# Patient Record
Sex: Female | Born: 1945
Health system: Southern US, Community
[De-identification: ages and names within clinical notes are randomized; demographics above are authoritative.]

## PROBLEM LIST (undated history)

## (undated) DIAGNOSIS — E039 Hypothyroidism, unspecified: Secondary | ICD-10-CM

## (undated) DIAGNOSIS — F32A Depression, unspecified: Secondary | ICD-10-CM

## (undated) DIAGNOSIS — F419 Anxiety disorder, unspecified: Secondary | ICD-10-CM

## (undated) DIAGNOSIS — F329 Major depressive disorder, single episode, unspecified: Secondary | ICD-10-CM

## (undated) DIAGNOSIS — E119 Type 2 diabetes mellitus without complications: Secondary | ICD-10-CM

## (undated) DIAGNOSIS — M1990 Unspecified osteoarthritis, unspecified site: Secondary | ICD-10-CM

## (undated) DIAGNOSIS — C73 Malignant neoplasm of thyroid gland: Secondary | ICD-10-CM

## (undated) DIAGNOSIS — I2510 Atherosclerotic heart disease of native coronary artery without angina pectoris: Secondary | ICD-10-CM

## (undated) DIAGNOSIS — E7800 Pure hypercholesterolemia, unspecified: Secondary | ICD-10-CM

## (undated) DIAGNOSIS — I10 Essential (primary) hypertension: Secondary | ICD-10-CM

## (undated) DIAGNOSIS — E785 Hyperlipidemia, unspecified: Secondary | ICD-10-CM

## (undated) DIAGNOSIS — M47816 Spondylosis without myelopathy or radiculopathy, lumbar region: Secondary | ICD-10-CM

## (undated) HISTORY — DX: Malignant neoplasm of thyroid gland: C73

## (undated) HISTORY — PX: DOPPLER ECHOCARDIOGRAPHY: SHX263

## (undated) HISTORY — PX: ABDOMINAL HYSTERECTOMY: SHX81

## (undated) HISTORY — DX: Atherosclerotic heart disease of native coronary artery without angina pectoris: I25.10

## (undated) HISTORY — PX: OTHER SURGICAL HISTORY: SHX169

## (undated) HISTORY — PX: KNEE SURGERY: SHX244

## (undated) HISTORY — DX: Hyperlipidemia, unspecified: E78.5

## (undated) HISTORY — DX: Anxiety disorder, unspecified: F41.9

## (undated) HISTORY — PX: TONSILLECTOMY: SUR1361

## (undated) HISTORY — DX: Type 2 diabetes mellitus without complications: E11.9

## (undated) HISTORY — DX: Essential (primary) hypertension: I10

## (undated) HISTORY — PX: CARDIAC CATHETERIZATION: SHX172

## (undated) HISTORY — PX: VESICOVAGINAL FISTULA CLOSURE W/ TAH: SUR271

## (undated) HISTORY — PX: THYROIDECTOMY: SHX17

## (undated) HISTORY — PX: COLONOSCOPY: SHX174

## (undated) HISTORY — DX: Hypothyroidism, unspecified: E03.9

## (undated) HISTORY — DX: Unspecified osteoarthritis, unspecified site: M19.90

## (undated) HISTORY — DX: Spondylosis without myelopathy or radiculopathy, lumbar region: M47.816

---

## 1999-03-23 DIAGNOSIS — C73 Malignant neoplasm of thyroid gland: Secondary | ICD-10-CM | POA: Insufficient documentation

## 1999-03-23 HISTORY — DX: Malignant neoplasm of thyroid gland: C73

## 1999-05-21 ENCOUNTER — Other Ambulatory Visit: Admission: RE | Admit: 1999-05-21 | Discharge: 1999-05-21 | Payer: Self-pay | Admitting: Obstetrics & Gynecology

## 1999-09-24 LAB — HM DIABETES FOOT EXAM

## 2000-12-15 ENCOUNTER — Encounter (HOSPITAL_COMMUNITY): Admission: RE | Admit: 2000-12-15 | Discharge: 2001-01-14 | Payer: Self-pay | Admitting: Rheumatology

## 2001-08-10 ENCOUNTER — Ambulatory Visit (HOSPITAL_COMMUNITY): Admission: RE | Admit: 2001-08-10 | Discharge: 2001-08-10 | Payer: Self-pay | Admitting: Family Medicine

## 2001-08-10 ENCOUNTER — Encounter: Payer: Self-pay | Admitting: Family Medicine

## 2001-12-19 ENCOUNTER — Encounter: Payer: Self-pay | Admitting: Family Medicine

## 2001-12-19 ENCOUNTER — Ambulatory Visit (HOSPITAL_COMMUNITY): Admission: RE | Admit: 2001-12-19 | Discharge: 2001-12-19 | Payer: Self-pay | Admitting: Family Medicine

## 2001-12-25 ENCOUNTER — Ambulatory Visit (HOSPITAL_COMMUNITY): Admission: RE | Admit: 2001-12-25 | Discharge: 2001-12-25 | Payer: Self-pay | Admitting: Family Medicine

## 2001-12-25 ENCOUNTER — Encounter: Payer: Self-pay | Admitting: Family Medicine

## 2002-02-26 ENCOUNTER — Encounter (HOSPITAL_COMMUNITY): Admission: RE | Admit: 2002-02-26 | Discharge: 2002-03-28 | Payer: Self-pay | Admitting: Oncology

## 2002-02-26 ENCOUNTER — Encounter: Admission: RE | Admit: 2002-02-26 | Discharge: 2002-02-26 | Payer: Self-pay | Admitting: Oncology

## 2002-03-29 ENCOUNTER — Ambulatory Visit (HOSPITAL_COMMUNITY): Admission: RE | Admit: 2002-03-29 | Discharge: 2002-03-29 | Payer: Self-pay | Admitting: Family Medicine

## 2002-03-29 ENCOUNTER — Encounter: Payer: Self-pay | Admitting: Family Medicine

## 2002-05-01 ENCOUNTER — Encounter: Admission: RE | Admit: 2002-05-01 | Discharge: 2002-05-01 | Payer: Self-pay | Admitting: Oncology

## 2002-05-02 ENCOUNTER — Encounter (HOSPITAL_COMMUNITY): Admission: RE | Admit: 2002-05-02 | Discharge: 2002-06-01 | Payer: Self-pay | Admitting: Oncology

## 2002-05-02 ENCOUNTER — Encounter (HOSPITAL_COMMUNITY): Payer: Self-pay | Admitting: Oncology

## 2003-04-16 ENCOUNTER — Emergency Department (HOSPITAL_COMMUNITY): Admission: EM | Admit: 2003-04-16 | Discharge: 2003-04-16 | Payer: Self-pay | Admitting: *Deleted

## 2003-05-03 ENCOUNTER — Ambulatory Visit (HOSPITAL_COMMUNITY): Admission: RE | Admit: 2003-05-03 | Discharge: 2003-05-03 | Payer: Self-pay | Admitting: Family Medicine

## 2003-08-08 ENCOUNTER — Ambulatory Visit (HOSPITAL_COMMUNITY): Admission: RE | Admit: 2003-08-08 | Discharge: 2003-08-08 | Payer: Self-pay | Admitting: Family Medicine

## 2003-08-22 ENCOUNTER — Ambulatory Visit (HOSPITAL_COMMUNITY): Admission: RE | Admit: 2003-08-22 | Discharge: 2003-08-22 | Payer: Self-pay | Admitting: Family Medicine

## 2003-10-21 ENCOUNTER — Encounter: Payer: Self-pay | Admitting: Family Medicine

## 2003-10-21 ENCOUNTER — Ambulatory Visit (HOSPITAL_COMMUNITY): Admission: RE | Admit: 2003-10-21 | Discharge: 2003-10-21 | Payer: Self-pay | Admitting: Internal Medicine

## 2003-10-21 LAB — HM COLONOSCOPY: HM Colonoscopy: ABNORMAL

## 2004-02-14 ENCOUNTER — Ambulatory Visit: Payer: Self-pay | Admitting: Family Medicine

## 2004-02-17 ENCOUNTER — Ambulatory Visit: Payer: Self-pay | Admitting: Family Medicine

## 2004-02-19 ENCOUNTER — Ambulatory Visit (HOSPITAL_COMMUNITY): Admission: RE | Admit: 2004-02-19 | Discharge: 2004-02-19 | Payer: Self-pay | Admitting: Family Medicine

## 2004-03-11 ENCOUNTER — Ambulatory Visit: Payer: Self-pay | Admitting: Family Medicine

## 2004-03-12 ENCOUNTER — Ambulatory Visit (HOSPITAL_COMMUNITY): Admission: RE | Admit: 2004-03-12 | Discharge: 2004-03-12 | Payer: Self-pay | Admitting: Family Medicine

## 2004-03-22 HISTORY — PX: SPINE SURGERY: SHX786

## 2004-05-25 ENCOUNTER — Ambulatory Visit: Payer: Self-pay | Admitting: Family Medicine

## 2004-06-01 ENCOUNTER — Ambulatory Visit: Payer: Self-pay | Admitting: Family Medicine

## 2004-06-07 ENCOUNTER — Ambulatory Visit (HOSPITAL_COMMUNITY): Admission: RE | Admit: 2004-06-07 | Discharge: 2004-06-07 | Payer: Self-pay | Admitting: Family Medicine

## 2004-06-11 ENCOUNTER — Inpatient Hospital Stay (HOSPITAL_COMMUNITY): Admission: RE | Admit: 2004-06-11 | Discharge: 2004-06-12 | Payer: Self-pay | Admitting: Neurosurgery

## 2004-07-14 ENCOUNTER — Ambulatory Visit (HOSPITAL_COMMUNITY): Admission: RE | Admit: 2004-07-14 | Discharge: 2004-07-14 | Payer: Self-pay | Admitting: Family Medicine

## 2004-07-14 ENCOUNTER — Ambulatory Visit: Payer: Self-pay | Admitting: Family Medicine

## 2004-10-08 ENCOUNTER — Other Ambulatory Visit: Admission: RE | Admit: 2004-10-08 | Discharge: 2004-10-08 | Payer: Self-pay | Admitting: Obstetrics & Gynecology

## 2004-12-07 ENCOUNTER — Ambulatory Visit: Payer: Self-pay | Admitting: Family Medicine

## 2005-01-18 ENCOUNTER — Ambulatory Visit: Payer: Self-pay | Admitting: Family Medicine

## 2005-01-19 ENCOUNTER — Ambulatory Visit (HOSPITAL_COMMUNITY): Admission: RE | Admit: 2005-01-19 | Discharge: 2005-01-19 | Payer: Self-pay | Admitting: Family Medicine

## 2005-01-25 ENCOUNTER — Ambulatory Visit (HOSPITAL_COMMUNITY): Admission: RE | Admit: 2005-01-25 | Discharge: 2005-01-25 | Payer: Self-pay | Admitting: Family Medicine

## 2005-02-25 ENCOUNTER — Ambulatory Visit: Payer: Self-pay | Admitting: Family Medicine

## 2005-03-04 ENCOUNTER — Ambulatory Visit: Payer: Self-pay | Admitting: Family Medicine

## 2005-03-19 ENCOUNTER — Ambulatory Visit: Payer: Self-pay | Admitting: Family Medicine

## 2005-04-30 ENCOUNTER — Ambulatory Visit: Payer: Self-pay | Admitting: Family Medicine

## 2005-05-04 ENCOUNTER — Ambulatory Visit: Payer: Self-pay | Admitting: Family Medicine

## 2005-07-07 ENCOUNTER — Ambulatory Visit: Payer: Self-pay | Admitting: Family Medicine

## 2005-07-22 ENCOUNTER — Ambulatory Visit: Payer: Self-pay | Admitting: Family Medicine

## 2005-07-26 ENCOUNTER — Ambulatory Visit (HOSPITAL_COMMUNITY): Admission: RE | Admit: 2005-07-26 | Discharge: 2005-07-26 | Payer: Self-pay | Admitting: Family Medicine

## 2005-10-05 ENCOUNTER — Ambulatory Visit: Payer: Self-pay | Admitting: Family Medicine

## 2005-12-22 ENCOUNTER — Ambulatory Visit: Payer: Self-pay | Admitting: Family Medicine

## 2005-12-24 ENCOUNTER — Ambulatory Visit (HOSPITAL_COMMUNITY): Admission: RE | Admit: 2005-12-24 | Discharge: 2005-12-24 | Payer: Self-pay | Admitting: Family Medicine

## 2006-06-15 ENCOUNTER — Ambulatory Visit: Payer: Self-pay | Admitting: Family Medicine

## 2006-06-17 ENCOUNTER — Encounter: Payer: Self-pay | Admitting: Family Medicine

## 2006-06-17 LAB — CONVERTED CEMR LAB
ALT: 25 units/L (ref 0–35)
AST: 16 units/L (ref 0–37)
Albumin: 4.6 g/dL (ref 3.5–5.2)
Alkaline Phosphatase: 64 units/L (ref 39–117)
BUN: 25 mg/dL — ABNORMAL HIGH (ref 6–23)
Basophils Absolute: 0 10*3/uL (ref 0.0–0.1)
Basophils Relative: 0 % (ref 0–1)
Bilirubin, Direct: 0.1 mg/dL (ref 0.0–0.3)
CO2: 25 meq/L (ref 19–32)
Calcium: 9.7 mg/dL (ref 8.4–10.5)
Chloride: 95 meq/L — ABNORMAL LOW (ref 96–112)
Cholesterol: 189 mg/dL (ref 0–200)
Creatinine, Ser: 1.01 mg/dL (ref 0.40–1.20)
Eosinophils Absolute: 0.5 10*3/uL (ref 0.0–0.7)
Eosinophils Relative: 6 % — ABNORMAL HIGH (ref 0–5)
Glucose, Bld: 164 mg/dL — ABNORMAL HIGH (ref 70–99)
HCT: 41.2 % (ref 36.0–46.0)
HDL: 40 mg/dL (ref >39)
Hemoglobin: 12.7 g/dL (ref 12.0–15.0)
Indirect Bilirubin: 0.3 mg/dL (ref 0.0–0.9)
LDL Cholesterol: 117 mg/dL — ABNORMAL HIGH (ref 0–99)
Lymphocytes Relative: 35 % (ref 12–46)
Lymphs Abs: 2.9 10*3/uL (ref 0.7–3.3)
MCHC: 30.8 g/dL (ref 30.0–36.0)
MCV: 84.1 fL (ref 78.0–100.0)
Microalb, Ur: 2.58 mg/dL — ABNORMAL HIGH (ref 0.00–1.89)
Monocytes Absolute: 0.4 10*3/uL (ref 0.2–0.7)
Monocytes Relative: 5 % (ref 3–11)
Neutro Abs: 4.4 10*3/uL (ref 1.7–7.7)
Neutrophils Relative %: 54 % (ref 43–77)
Platelets: 341 10*3/uL (ref 150–400)
Potassium: 5 meq/L (ref 3.5–5.3)
RBC: 4.9 M/uL (ref 3.87–5.11)
RDW: 13.4 % (ref 11.5–14.0)
Sodium: 135 meq/L (ref 135–145)
TSH: 0.015 microintl units/mL — ABNORMAL LOW (ref 0.350–5.50)
Total Bilirubin: 0.4 mg/dL (ref 0.3–1.2)
Total CHOL/HDL Ratio: 4.7
Total Protein: 8.5 g/dL — ABNORMAL HIGH (ref 6.0–8.3)
Triglycerides: 160 mg/dL — ABNORMAL HIGH (ref <150)
VLDL: 32 mg/dL (ref 0–40)
WBC: 8.2 10*3/uL (ref 4.0–10.5)

## 2006-06-20 ENCOUNTER — Encounter: Payer: Self-pay | Admitting: Family Medicine

## 2006-06-20 LAB — CONVERTED CEMR LAB: Hgb A1c MFr Bld: 8.3 % — ABNORMAL HIGH (ref 4.6–6.1)

## 2006-08-23 ENCOUNTER — Ambulatory Visit: Payer: Self-pay | Admitting: Family Medicine

## 2006-09-21 ENCOUNTER — Encounter: Payer: Self-pay | Admitting: Family Medicine

## 2006-09-21 LAB — CONVERTED CEMR LAB
ALT: 23 units/L (ref 0–35)
AST: 16 units/L (ref 0–37)
Albumin: 4.4 g/dL (ref 3.5–5.2)
Alkaline Phosphatase: 58 units/L (ref 39–117)
BUN: 30 mg/dL — ABNORMAL HIGH (ref 6–23)
Bilirubin, Direct: 0.1 mg/dL (ref 0.0–0.3)
CO2: 24 meq/L (ref 19–32)
Calcium: 9.5 mg/dL (ref 8.4–10.5)
Chloride: 98 meq/L (ref 96–112)
Cholesterol: 97 mg/dL (ref 0–200)
Creatinine, Ser: 1.14 mg/dL (ref 0.40–1.20)
Glucose, Bld: 152 mg/dL — ABNORMAL HIGH (ref 70–99)
HDL: 36 mg/dL — ABNORMAL LOW (ref >39)
Hgb A1c MFr Bld: 7.9 % — ABNORMAL HIGH (ref 4.6–6.1)
Indirect Bilirubin: 0.3 mg/dL (ref 0.0–0.9)
LDL Cholesterol: 40 mg/dL (ref 0–99)
Potassium: 6 meq/L — ABNORMAL HIGH (ref 3.5–5.3)
Sodium: 134 meq/L — ABNORMAL LOW (ref 135–145)
Total Bilirubin: 0.4 mg/dL (ref 0.3–1.2)
Total CHOL/HDL Ratio: 2.7
Total Protein: 8.2 g/dL (ref 6.0–8.3)
Triglycerides: 104 mg/dL (ref <150)
VLDL: 21 mg/dL (ref 0–40)

## 2006-09-22 ENCOUNTER — Ambulatory Visit: Payer: Self-pay | Admitting: Family Medicine

## 2006-09-22 LAB — CONVERTED CEMR LAB: TSH: 0.035 microintl units/mL — ABNORMAL LOW (ref 0.350–5.50)

## 2006-09-27 ENCOUNTER — Encounter: Payer: Self-pay | Admitting: Family Medicine

## 2006-09-27 LAB — CONVERTED CEMR LAB
BUN: 21 mg/dL (ref 6–23)
CO2: 28 meq/L (ref 19–32)
Calcium: 8.9 mg/dL (ref 8.4–10.5)
Chloride: 98 meq/L (ref 96–112)
Creatinine, Ser: 0.88 mg/dL (ref 0.40–1.20)
Glucose, Bld: 206 mg/dL — ABNORMAL HIGH (ref 70–99)
Potassium: 4.4 meq/L (ref 3.5–5.3)
Sodium: 132 meq/L — ABNORMAL LOW (ref 135–145)

## 2006-10-19 ENCOUNTER — Ambulatory Visit: Payer: Self-pay | Admitting: Family Medicine

## 2006-10-24 ENCOUNTER — Ambulatory Visit (HOSPITAL_COMMUNITY): Admission: RE | Admit: 2006-10-24 | Discharge: 2006-10-24 | Payer: Self-pay | Admitting: Family Medicine

## 2006-11-01 ENCOUNTER — Encounter (HOSPITAL_COMMUNITY): Admission: RE | Admit: 2006-11-01 | Discharge: 2006-12-01 | Payer: Self-pay | Admitting: Family Medicine

## 2006-11-11 ENCOUNTER — Ambulatory Visit: Payer: Self-pay | Admitting: Family Medicine

## 2006-11-11 LAB — CONVERTED CEMR LAB: Potassium: 4.4 meq/L (ref 3.5–5.3)

## 2006-11-24 ENCOUNTER — Ambulatory Visit: Payer: Self-pay | Admitting: Family Medicine

## 2006-11-25 ENCOUNTER — Encounter: Payer: Self-pay | Admitting: Family Medicine

## 2006-12-02 ENCOUNTER — Encounter (HOSPITAL_COMMUNITY): Admission: RE | Admit: 2006-12-02 | Discharge: 2006-12-20 | Payer: Self-pay | Admitting: Family Medicine

## 2006-12-15 ENCOUNTER — Ambulatory Visit: Payer: Self-pay | Admitting: Family Medicine

## 2006-12-15 LAB — CONVERTED CEMR LAB
ALT: 29 units/L (ref 0–35)
AST: 22 units/L (ref 0–37)
Albumin: 4.4 g/dL (ref 3.5–5.2)
Alkaline Phosphatase: 59 units/L (ref 39–117)
BUN: 19 mg/dL (ref 6–23)
Basophils Absolute: 0 10*3/uL (ref 0.0–0.1)
Basophils Relative: 1 % (ref 0–1)
Bilirubin, Direct: <0.1 mg/dL (ref 0.0–0.3)
CO2: 25 meq/L (ref 19–32)
Calcium: 9.5 mg/dL (ref 8.4–10.5)
Chloride: 100 meq/L (ref 96–112)
Creatinine, Ser: 0.8 mg/dL (ref 0.40–1.20)
Eosinophils Absolute: 0.1 10*3/uL (ref 0.0–0.7)
Eosinophils Relative: 1 % (ref 0–5)
Glucose, Bld: 93 mg/dL (ref 70–99)
HCT: 37.3 % (ref 36.0–46.0)
Hemoglobin: 11.7 g/dL — ABNORMAL LOW (ref 12.0–15.0)
Lymphocytes Relative: 27 % (ref 12–46)
Lymphs Abs: 1.3 10*3/uL (ref 0.7–3.3)
MCHC: 31.4 g/dL (ref 30.0–36.0)
MCV: 82.7 fL (ref 78.0–100.0)
Monocytes Absolute: 0.5 10*3/uL (ref 0.2–0.7)
Monocytes Relative: 11 % (ref 3–11)
Neutro Abs: 2.9 10*3/uL (ref 1.7–7.7)
Neutrophils Relative %: 61 % (ref 43–77)
Platelets: 337 10*3/uL (ref 150–400)
Potassium: 4 meq/L (ref 3.5–5.3)
RBC: 4.51 M/uL (ref 3.87–5.11)
RDW: 13 % (ref 11.5–14.0)
Sodium: 137 meq/L (ref 135–145)
Total Bilirubin: 0.3 mg/dL (ref 0.3–1.2)
Total CK: 64 units/L (ref 7–177)
Total Protein: 8.5 g/dL — ABNORMAL HIGH (ref 6.0–8.3)
WBC: 4.7 10*3/uL (ref 4.0–10.5)

## 2006-12-21 ENCOUNTER — Ambulatory Visit (HOSPITAL_COMMUNITY): Admission: RE | Admit: 2006-12-21 | Discharge: 2006-12-21 | Payer: Self-pay | Admitting: Family Medicine

## 2006-12-27 ENCOUNTER — Ambulatory Visit (HOSPITAL_COMMUNITY): Admission: RE | Admit: 2006-12-27 | Discharge: 2006-12-27 | Payer: Self-pay | Admitting: Family Medicine

## 2006-12-27 ENCOUNTER — Ambulatory Visit: Payer: Self-pay | Admitting: Family Medicine

## 2007-01-04 ENCOUNTER — Ambulatory Visit: Payer: Self-pay | Admitting: Family Medicine

## 2007-01-05 ENCOUNTER — Ambulatory Visit (HOSPITAL_COMMUNITY): Admission: RE | Admit: 2007-01-05 | Discharge: 2007-01-05 | Payer: Self-pay | Admitting: Family Medicine

## 2007-02-01 ENCOUNTER — Ambulatory Visit: Payer: Self-pay | Admitting: Family Medicine

## 2007-02-02 ENCOUNTER — Encounter: Payer: Self-pay | Admitting: Family Medicine

## 2007-03-20 ENCOUNTER — Ambulatory Visit: Payer: Self-pay | Admitting: Family Medicine

## 2007-04-14 ENCOUNTER — Encounter: Payer: Self-pay | Admitting: Family Medicine

## 2007-04-14 DIAGNOSIS — I10 Essential (primary) hypertension: Secondary | ICD-10-CM | POA: Insufficient documentation

## 2007-04-14 DIAGNOSIS — G5600 Carpal tunnel syndrome, unspecified upper limb: Secondary | ICD-10-CM | POA: Insufficient documentation

## 2007-04-14 DIAGNOSIS — R519 Headache, unspecified: Secondary | ICD-10-CM | POA: Insufficient documentation

## 2007-04-14 DIAGNOSIS — R51 Headache: Secondary | ICD-10-CM

## 2007-04-14 DIAGNOSIS — M479 Spondylosis, unspecified: Secondary | ICD-10-CM | POA: Insufficient documentation

## 2007-04-14 DIAGNOSIS — I2510 Atherosclerotic heart disease of native coronary artery without angina pectoris: Secondary | ICD-10-CM | POA: Insufficient documentation

## 2007-04-14 DIAGNOSIS — E785 Hyperlipidemia, unspecified: Secondary | ICD-10-CM | POA: Insufficient documentation

## 2007-05-11 ENCOUNTER — Ambulatory Visit: Payer: Self-pay | Admitting: Family Medicine

## 2007-05-24 ENCOUNTER — Ambulatory Visit: Payer: Self-pay | Admitting: Family Medicine

## 2007-09-11 ENCOUNTER — Ambulatory Visit: Payer: Self-pay | Admitting: Family Medicine

## 2007-09-11 LAB — CONVERTED CEMR LAB: Microalb, Ur: 13.6 mg/dL — ABNORMAL HIGH (ref 0.00–1.89)

## 2007-09-12 ENCOUNTER — Encounter: Payer: Self-pay | Admitting: Family Medicine

## 2007-09-18 ENCOUNTER — Ambulatory Visit (HOSPITAL_COMMUNITY): Admission: RE | Admit: 2007-09-18 | Discharge: 2007-09-18 | Payer: Self-pay | Admitting: Family Medicine

## 2007-09-18 ENCOUNTER — Ambulatory Visit: Payer: Self-pay | Admitting: Family Medicine

## 2007-09-19 ENCOUNTER — Ambulatory Visit (HOSPITAL_COMMUNITY): Admission: RE | Admit: 2007-09-19 | Discharge: 2007-09-19 | Payer: Self-pay | Admitting: Family Medicine

## 2007-10-05 ENCOUNTER — Ambulatory Visit (HOSPITAL_COMMUNITY): Admission: RE | Admit: 2007-10-05 | Discharge: 2007-10-05 | Payer: Self-pay | Admitting: Family Medicine

## 2007-10-24 ENCOUNTER — Ambulatory Visit: Payer: Self-pay | Admitting: Otolaryngology

## 2007-10-31 ENCOUNTER — Encounter: Payer: Self-pay | Admitting: Family Medicine

## 2007-11-14 ENCOUNTER — Encounter: Payer: Self-pay | Admitting: Family Medicine

## 2007-11-21 ENCOUNTER — Encounter: Payer: Self-pay | Admitting: Family Medicine

## 2007-11-23 ENCOUNTER — Ambulatory Visit: Payer: Self-pay | Admitting: Family Medicine

## 2007-11-23 DIAGNOSIS — J309 Allergic rhinitis, unspecified: Secondary | ICD-10-CM | POA: Insufficient documentation

## 2007-11-23 LAB — CONVERTED CEMR LAB: Glucose, Bld: 80 mg/dL

## 2007-12-06 ENCOUNTER — Ambulatory Visit: Payer: Self-pay | Admitting: Family Medicine

## 2007-12-14 ENCOUNTER — Encounter: Payer: Self-pay | Admitting: Family Medicine

## 2008-03-26 ENCOUNTER — Encounter: Payer: Self-pay | Admitting: Family Medicine

## 2008-03-29 ENCOUNTER — Ambulatory Visit: Payer: Self-pay | Admitting: Family Medicine

## 2008-03-29 LAB — CONVERTED CEMR LAB
Glucose, Bld: 139 mg/dL
Hgb A1c MFr Bld: 7.8 %

## 2008-03-30 DIAGNOSIS — E669 Obesity, unspecified: Secondary | ICD-10-CM | POA: Insufficient documentation

## 2008-04-11 ENCOUNTER — Ambulatory Visit: Payer: Self-pay | Admitting: Family Medicine

## 2008-04-11 DIAGNOSIS — H6500 Acute serous otitis media, unspecified ear: Secondary | ICD-10-CM | POA: Insufficient documentation

## 2008-04-11 LAB — CONVERTED CEMR LAB
BUN: 16 mg/dL (ref 6–23)
Basophils Absolute: 0.1 10*3/uL (ref 0.0–0.1)
Basophils Relative: 1 % (ref 0–1)
CO2: 30 meq/L (ref 19–32)
Calcium: 8.9 mg/dL (ref 8.4–10.5)
Chloride: 100 meq/L (ref 96–112)
Creatinine, Ser: 0.67 mg/dL (ref 0.40–1.20)
Eosinophils Absolute: 0.3 10*3/uL (ref 0.0–0.7)
Eosinophils Relative: 5 % (ref 0–5)
Glucose, Bld: 138 mg/dL — ABNORMAL HIGH (ref 70–99)
HCT: 36.1 % (ref 36.0–46.0)
Hemoglobin: 11.7 g/dL — ABNORMAL LOW (ref 12.0–15.0)
Lymphocytes Relative: 35 % (ref 12–46)
Lymphs Abs: 2.3 10*3/uL (ref 0.7–4.0)
MCHC: 32.3 g/dL (ref 30.0–36.0)
MCV: 82.9 fL (ref 78.0–100.0)
Monocytes Absolute: 0.5 10*3/uL (ref 0.1–1.0)
Monocytes Relative: 7 % (ref 3–12)
Neutro Abs: 3.4 10*3/uL (ref 1.7–7.7)
Neutrophils Relative %: 52 % (ref 43–77)
Platelets: 283 10*3/uL (ref 150–400)
Potassium: 4.1 meq/L (ref 3.5–5.3)
RBC: 4.35 M/uL (ref 3.87–5.11)
RDW: 13.8 % (ref 11.5–15.5)
Sodium: 136 meq/L (ref 135–145)
WBC: 6.6 10*3/uL (ref 4.0–10.5)

## 2008-05-22 ENCOUNTER — Encounter: Payer: Self-pay | Admitting: Family Medicine

## 2008-06-04 ENCOUNTER — Encounter: Payer: Self-pay | Admitting: Family Medicine

## 2008-07-17 ENCOUNTER — Ambulatory Visit: Payer: Self-pay | Admitting: Family Medicine

## 2008-07-17 LAB — CONVERTED CEMR LAB
Glucose, Bld: 148 mg/dL
Hgb A1c MFr Bld: 7.7 %

## 2008-08-05 ENCOUNTER — Ambulatory Visit: Payer: Self-pay | Admitting: Family Medicine

## 2008-08-05 LAB — CONVERTED CEMR LAB: Glucose, Bld: 195 mg/dL

## 2008-08-06 ENCOUNTER — Encounter: Payer: Self-pay | Admitting: Family Medicine

## 2008-08-13 ENCOUNTER — Ambulatory Visit (HOSPITAL_COMMUNITY): Admission: RE | Admit: 2008-08-13 | Discharge: 2008-08-13 | Payer: Self-pay | Admitting: Family Medicine

## 2008-10-07 ENCOUNTER — Encounter: Payer: Self-pay | Admitting: Family Medicine

## 2008-10-16 ENCOUNTER — Ambulatory Visit: Payer: Self-pay | Admitting: Family Medicine

## 2008-10-16 LAB — CONVERTED CEMR LAB
Glucose, Bld: 167 mg/dL
Hgb A1c MFr Bld: 7.7 %

## 2008-10-17 ENCOUNTER — Encounter: Payer: Self-pay | Admitting: Family Medicine

## 2008-10-17 LAB — CONVERTED CEMR LAB
Creatinine, Urine: 130.7 mg/dL
Microalb Creat Ratio: 7.5 mg/g (ref 0.0–30.0)
Microalb, Ur: 0.98 mg/dL (ref 0.00–1.89)

## 2008-10-25 ENCOUNTER — Telehealth: Payer: Self-pay | Admitting: Family Medicine

## 2008-11-29 ENCOUNTER — Encounter: Payer: Self-pay | Admitting: Family Medicine

## 2008-12-09 ENCOUNTER — Telehealth: Payer: Self-pay | Admitting: Family Medicine

## 2008-12-19 ENCOUNTER — Encounter: Payer: Self-pay | Admitting: Family Medicine

## 2008-12-19 LAB — CONVERTED CEMR LAB
ALT: 24 units/L (ref 0–35)
AST: 21 units/L (ref 0–37)
Albumin: 4.3 g/dL (ref 3.5–5.2)
Alkaline Phosphatase: 43 units/L (ref 39–117)
BUN: 14 mg/dL (ref 6–23)
Bilirubin, Direct: 0.1 mg/dL (ref 0.0–0.3)
CO2: 26 meq/L (ref 19–32)
Calcium: 9.2 mg/dL (ref 8.4–10.5)
Chloride: 100 meq/L (ref 96–112)
Cholesterol: 148 mg/dL (ref 0–200)
Creatinine, Ser: 0.84 mg/dL (ref 0.40–1.20)
Glucose, Bld: 138 mg/dL — ABNORMAL HIGH (ref 70–99)
HDL: 41 mg/dL (ref >39)
Indirect Bilirubin: 0.2 mg/dL (ref 0.0–0.9)
LDL Cholesterol: 83 mg/dL (ref 0–99)
Potassium: 4.3 meq/L (ref 3.5–5.3)
Sodium: 140 meq/L (ref 135–145)
Total Bilirubin: 0.3 mg/dL (ref 0.3–1.2)
Total CHOL/HDL Ratio: 3.6
Total Protein: 7.8 g/dL (ref 6.0–8.3)
Triglycerides: 120 mg/dL (ref <150)
VLDL: 24 mg/dL (ref 0–40)

## 2009-01-09 ENCOUNTER — Ambulatory Visit: Payer: Self-pay | Admitting: Family Medicine

## 2009-01-09 DIAGNOSIS — F329 Major depressive disorder, single episode, unspecified: Secondary | ICD-10-CM | POA: Insufficient documentation

## 2009-01-09 LAB — CONVERTED CEMR LAB
Glucose, Bld: 146 mg/dL
Hgb A1c MFr Bld: 7.5 %

## 2009-01-30 ENCOUNTER — Ambulatory Visit (HOSPITAL_COMMUNITY): Payer: Self-pay | Admitting: Psychiatry

## 2009-02-06 ENCOUNTER — Ambulatory Visit: Payer: Self-pay | Admitting: Family Medicine

## 2009-02-06 LAB — CONVERTED CEMR LAB: Glucose, Bld: 134 mg/dL

## 2009-02-10 ENCOUNTER — Ambulatory Visit (HOSPITAL_COMMUNITY): Payer: Self-pay | Admitting: Psychiatry

## 2009-02-26 ENCOUNTER — Ambulatory Visit (HOSPITAL_COMMUNITY): Payer: Self-pay | Admitting: Psychiatry

## 2009-02-26 ENCOUNTER — Telehealth: Payer: Self-pay | Admitting: Family Medicine

## 2009-02-28 ENCOUNTER — Telehealth: Payer: Self-pay | Admitting: Family Medicine

## 2009-03-12 ENCOUNTER — Encounter: Payer: Self-pay | Admitting: Family Medicine

## 2009-03-12 ENCOUNTER — Ambulatory Visit (HOSPITAL_COMMUNITY): Payer: Self-pay | Admitting: Psychiatry

## 2009-03-26 ENCOUNTER — Ambulatory Visit (HOSPITAL_COMMUNITY): Payer: Self-pay | Admitting: Psychiatry

## 2009-04-02 ENCOUNTER — Encounter: Payer: Self-pay | Admitting: Family Medicine

## 2009-04-04 ENCOUNTER — Encounter: Admission: RE | Admit: 2009-04-04 | Discharge: 2009-04-04 | Payer: Self-pay | Admitting: Neurosurgery

## 2009-04-17 ENCOUNTER — Ambulatory Visit: Payer: Self-pay | Admitting: Family Medicine

## 2009-04-17 DIAGNOSIS — R5381 Other malaise: Secondary | ICD-10-CM | POA: Insufficient documentation

## 2009-04-17 DIAGNOSIS — R5383 Other fatigue: Secondary | ICD-10-CM | POA: Insufficient documentation

## 2009-04-17 LAB — CONVERTED CEMR LAB
Glucose, Bld: 187 mg/dL
Hgb A1c MFr Bld: 8.7 %

## 2009-04-27 DIAGNOSIS — J019 Acute sinusitis, unspecified: Secondary | ICD-10-CM | POA: Insufficient documentation

## 2009-04-29 ENCOUNTER — Encounter: Payer: Self-pay | Admitting: Family Medicine

## 2009-05-07 ENCOUNTER — Telehealth: Payer: Self-pay | Admitting: Family Medicine

## 2009-05-08 ENCOUNTER — Ambulatory Visit (HOSPITAL_COMMUNITY): Payer: Self-pay | Admitting: Psychiatry

## 2009-05-09 ENCOUNTER — Encounter: Payer: Self-pay | Admitting: Family Medicine

## 2009-05-12 ENCOUNTER — Encounter: Payer: Self-pay | Admitting: Family Medicine

## 2009-05-30 ENCOUNTER — Ambulatory Visit (HOSPITAL_COMMUNITY): Payer: Self-pay | Admitting: Psychiatry

## 2009-06-17 ENCOUNTER — Encounter: Payer: Self-pay | Admitting: Family Medicine

## 2009-06-19 ENCOUNTER — Ambulatory Visit: Payer: Self-pay | Admitting: Family Medicine

## 2009-06-19 LAB — CONVERTED CEMR LAB: Blood Glucose, Fasting: 155 mg/dL

## 2009-07-18 ENCOUNTER — Ambulatory Visit: Payer: Self-pay | Admitting: Family Medicine

## 2009-07-18 DIAGNOSIS — R079 Chest pain, unspecified: Secondary | ICD-10-CM | POA: Insufficient documentation

## 2009-07-23 ENCOUNTER — Encounter: Payer: Self-pay | Admitting: Family Medicine

## 2009-07-31 ENCOUNTER — Ambulatory Visit (HOSPITAL_COMMUNITY): Admission: RE | Admit: 2009-07-31 | Discharge: 2009-07-31 | Payer: Self-pay | Admitting: Neurosurgery

## 2009-08-05 ENCOUNTER — Encounter: Payer: Self-pay | Admitting: Family Medicine

## 2009-08-08 ENCOUNTER — Encounter: Payer: Self-pay | Admitting: Family Medicine

## 2009-09-01 ENCOUNTER — Encounter: Payer: Self-pay | Admitting: Family Medicine

## 2009-09-23 ENCOUNTER — Ambulatory Visit: Payer: Self-pay | Admitting: Family Medicine

## 2009-09-24 LAB — CONVERTED CEMR LAB
BUN: 13 mg/dL (ref 6–23)
Basophils Absolute: 0 10*3/uL (ref 0.0–0.1)
Basophils Relative: 0 % (ref 0–1)
CO2: 25 meq/L (ref 19–32)
Calcium: 9.1 mg/dL (ref 8.4–10.5)
Chloride: 99 meq/L (ref 96–112)
Creatinine, Ser: 0.68 mg/dL (ref 0.40–1.20)
Eosinophils Absolute: 0.3 10*3/uL (ref 0.0–0.7)
Eosinophils Relative: 3 % (ref 0–5)
Glucose, Bld: 221 mg/dL — ABNORMAL HIGH (ref 70–99)
HCT: 36.5 % (ref 36.0–46.0)
Hemoglobin: 11.8 g/dL — ABNORMAL LOW (ref 12.0–15.0)
Hgb A1c MFr Bld: 8.5 % — ABNORMAL HIGH (ref <5.7)
Lymphocytes Relative: 32 % (ref 12–46)
Lymphs Abs: 2.3 10*3/uL (ref 0.7–4.0)
MCHC: 32.3 g/dL (ref 30.0–36.0)
MCV: 83.9 fL (ref 78.0–100.0)
Monocytes Absolute: 0.4 10*3/uL (ref 0.1–1.0)
Monocytes Relative: 5 % (ref 3–12)
Neutro Abs: 4.4 10*3/uL (ref 1.7–7.7)
Neutrophils Relative %: 60 % (ref 43–77)
Platelets: 279 10*3/uL (ref 150–400)
Potassium: 4 meq/L (ref 3.5–5.3)
RBC: 4.35 M/uL (ref 3.87–5.11)
RDW: 14.1 % (ref 11.5–15.5)
Sodium: 139 meq/L (ref 135–145)
WBC: 7.3 10*3/uL (ref 4.0–10.5)

## 2009-11-20 ENCOUNTER — Encounter: Payer: Self-pay | Admitting: Family Medicine

## 2009-12-12 ENCOUNTER — Ambulatory Visit: Payer: Self-pay | Admitting: Family Medicine

## 2009-12-13 ENCOUNTER — Encounter: Payer: Self-pay | Admitting: Family Medicine

## 2009-12-13 LAB — CONVERTED CEMR LAB
Creatinine, Urine: 169 mg/dL
Microalb Creat Ratio: 29.8 mg/g (ref 0.0–30.0)
Microalb, Ur: 5.04 mg/dL — ABNORMAL HIGH (ref 0.00–1.89)

## 2009-12-17 ENCOUNTER — Telehealth: Payer: Self-pay | Admitting: Physician Assistant

## 2010-01-19 ENCOUNTER — Telehealth: Payer: Self-pay | Admitting: Family Medicine

## 2010-01-20 ENCOUNTER — Ambulatory Visit: Payer: Self-pay | Admitting: Family Medicine

## 2010-01-20 DIAGNOSIS — R1319 Other dysphagia: Secondary | ICD-10-CM | POA: Insufficient documentation

## 2010-01-20 DIAGNOSIS — K3189 Other diseases of stomach and duodenum: Secondary | ICD-10-CM | POA: Insufficient documentation

## 2010-01-20 DIAGNOSIS — R1013 Epigastric pain: Secondary | ICD-10-CM

## 2010-01-23 ENCOUNTER — Ambulatory Visit: Payer: Self-pay | Admitting: Internal Medicine

## 2010-01-23 DIAGNOSIS — K219 Gastro-esophageal reflux disease without esophagitis: Secondary | ICD-10-CM | POA: Insufficient documentation

## 2010-01-27 ENCOUNTER — Encounter: Payer: Self-pay | Admitting: Internal Medicine

## 2010-01-27 ENCOUNTER — Telehealth: Payer: Self-pay | Admitting: Family Medicine

## 2010-01-28 ENCOUNTER — Telehealth (INDEPENDENT_AMBULATORY_CARE_PROVIDER_SITE_OTHER): Payer: Self-pay | Admitting: *Deleted

## 2010-01-28 ENCOUNTER — Ambulatory Visit (HOSPITAL_COMMUNITY): Admission: RE | Admit: 2010-01-28 | Discharge: 2010-01-28 | Payer: Self-pay | Admitting: Family Medicine

## 2010-01-28 LAB — CONVERTED CEMR LAB
BUN: 15 mg/dL (ref 6–23)
CO2: 29 meq/L (ref 19–32)
Calcium: 9.3 mg/dL (ref 8.4–10.5)
Chloride: 97 meq/L (ref 96–112)
Creatinine, Ser: 0.81 mg/dL (ref 0.40–1.20)
Glucose, Bld: 117 mg/dL — ABNORMAL HIGH (ref 70–99)
Helicobacter Pylori Antibody-IgG: <0.4
Hgb A1c MFr Bld: 8.4 % — ABNORMAL HIGH (ref <5.7)
Potassium: 3.9 meq/L (ref 3.5–5.3)
Sodium: 137 meq/L (ref 135–145)

## 2010-02-04 ENCOUNTER — Encounter (HOSPITAL_COMMUNITY)
Admission: RE | Admit: 2010-02-04 | Discharge: 2010-03-06 | Payer: Self-pay | Source: Home / Self Care | Attending: Family Medicine | Admitting: Family Medicine

## 2010-04-07 ENCOUNTER — Encounter: Payer: Self-pay | Admitting: Family Medicine

## 2010-04-08 ENCOUNTER — Ambulatory Visit
Admission: RE | Admit: 2010-04-08 | Discharge: 2010-04-08 | Payer: Self-pay | Source: Home / Self Care | Attending: Family Medicine | Admitting: Family Medicine

## 2010-04-19 DIAGNOSIS — R197 Diarrhea, unspecified: Secondary | ICD-10-CM | POA: Insufficient documentation

## 2010-04-20 ENCOUNTER — Encounter: Payer: Self-pay | Admitting: Family Medicine

## 2010-04-21 NOTE — Assessment & Plan Note (Signed)
Summary: acid reflux/ slj   Vital Signs:  Patient profile:   65 year old female Menstrual status:  hysterectomy Height:      63 inches Weight:      204.75 pounds BMI:     36.40 O2 Sat:      98 % on Room air Pulse rate:   73 / minute Pulse rhythm:   regular Resp:     16 per minute BP sitting:   102 / 60  (left arm)  Vitals Entered By: Syliva Overman MD (January 20, 2010 4:02 PM)  Nutrition Counseling: Patient's BMI is greater than 25 and therefore counseled on weight management options.  O2 Flow:  Room air CC: follow-up visit Is Patient Diabetic? Yes Did you bring your meter with you today? No Pain Assessment Patient in pain? no        Primary Care Provider:  Syliva Overman MD  CC:  follow-up visit.  History of Present Illness: substernal chest pain worse when pt bends over or lies Pain with eating , nausea and bloating esp this past weekend. Pain is worse when lying down. recently has been on prednisone and ibuprofen. she reports regularly testing her blood sugars and reports imoprovement overall, though at times her sugars are elevated.  She has recently evaluated by gI for her chest pain and has ahd a negative eval for new ds, a trial of NSAID was in fact done with questionble relief.  Current Medications (verified): 1)  Hydrochlorothiazide 25 Mg  Tabs (Hydrochlorothiazide) .... Take One Tablet By Mouth Once A Day 2)  Glipizide Xl 10 Mg  Tb24 (Glipizide) .... Take One Tablet By Mouth Twice A Day 3)  Metoprolol Succinate 25 Mg  Xr24h-Tab (Metoprolol Succinate) .... Take One Tab By Mouth Two Times A Day 4)  Imipramine Hcl 25 Mg  Tabs (Imipramine Hcl) .... Take Four Tab By Mouth At Bedtime 5)  Metformin Hcl 1000 Mg Tabs (Metformin Hcl) .... Take 1 Tablet By Mouth Two Times A Day 6)  Synthroid 200 Mcg Tabs (Levothyroxine Sodium) .... One Tab By Mouth Once Daily 7)  Verapamil Hcl Cr 180 Mg Cr-Tabs (Verapamil Hcl) .... One Cap By Mouth Once Daily 8)  Nitrostat 0.4  Mg Subl (Nitroglycerin) .... Uad 9)  Paroxetine Hcl 10 Mg Tabs (Paroxetine Hcl) .... Take 1 Tablet By Mouth Once A Day 10)  Quinapril Hcl 10 Mg Tabs (Quinapril Hcl) .... Take 1 Tablet By Mouth Once A Day 11)  Bd Pen Needle Short U/f 31g X 8 Mm Misc (Insulin Pen Needle) .... Once Daily Use 12)  Accu-Chek Aviva  Strp (Glucose Blood) .... Once Daily Testing 13)  Lantus Solostar 100 Unit/ml Soln (Insulin Glargine) .... 50 Units Once Daily 14)  Penicillin V Potassium 250 Mg Tabs (Penicillin V Potassium) .... Take 1 Tablet By Mouth Two Times A Day 15)  Tessalon Perles 100 Mg Caps (Benzonatate) .... Take 1 Tablet By Mouth Two Times A Day 16)  Loratadine 10 Mg Tabs (Loratadine) .... Take 1 Tablet By Mouth Once A Day 17)  Pravastatin Sodium 80 Mg Tabs (Pravastatin Sodium) .... One Tab By Mouth At Bedtime Discontinue Lovastatin  Allergies (verified): 1)  ! Sulfa 2)  ! Nsaids 3)  ! Asa  Review of Systems      See HPI General:  Complains of fatigue. Eyes:  Denies blurring and discharge. ENT:  Denies hoarseness, nasal congestion, sinus pressure, and sore throat. CV:  Complains of chest pain or discomfort; denies palpitations, shortness  of breath with exertion, and swelling of feet. Resp:  Denies cough and sputum productive. GI:  Complains of indigestion; denies change in bowel habits and vomiting. GU:  Denies dysuria and urinary frequency. MS:  Denies joint pain and stiffness. Derm:  Denies itching and rash. Neuro:  Denies headaches and seizures. Psych:  Complains of anxiety and depression; denies mental problems, suicidal thoughts/plans, thoughts of violence, and unusual visions or sounds; improved o med. Endo:  Denies excessive hunger and excessive thirst. Heme:  Denies abnormal bruising and bleeding. Allergy:  Complains of seasonal allergies.  Physical Exam  General:  Well-developed,well-nourished,in no acute distress; alert,appropriate and cooperative throughout examination HEENT: No  facial asymmetry,  EOMI, No sinus tenderness, TM's Clear, oropharynx  pink and moist.   Chest: Clear to auscultation bilaterally. no reproduciblechest wall tenderness CVS: S1, S2, No murmurs, No S3.   Abd: Soft, mild epigastric and RUQ tenderness, no organomegaly, guarding or rebound.  MS: Adequate ROM spine, hips, shoulders and knees.  Ext: No edema.   CNS: CN 2-12 intact, power tone and sensation normal throughout.   Skin: Intact, no visible lesions or rashes.  Psych: Good eye contact, normal affect.  Memory intact, not anxious or depressed appearing.    Impression & Recommendations:  Problem # 1:  OTHER DYSPHAGIA (ICD-787.29) Assessment Deteriorated  Orders: Gastroenterology Referral (GI)  Problem # 2:  DYSPEPSIA (ICD-536.8) Assessment: Deteriorated  Orders: TLB-H. Pylori Abs(Helicobacter Pylori) ([address]HELICO) Gastroenterology Referral (GI) Radiology Referral (Radiology)  Problem # 3:  GERD (ICD-530.81) Assessment: Deteriorated  Orders: Medicare Electronic Prescription (Z6109)  Problem # 4:  CHEST PAIN UNSPECIFIED (ICD-786.50) Assessment: Unchanged recent card eval, neg for cVD, treated presumptively as chest wall pain,musculoskeletal, still symptomatic  Problem # 5:  DIABETES MELLITUS, TYPE II, WITHOUT COMPLICATIONS (ICD-250.00) Assessment: Comment Only  The following medications were removed from the medication list:    Onglyza 5 Mg Tabs (Saxagliptin hcl) ..... One tab by mouth qd Her updated medication list for this problem includes:    Glipizide Xl 10 Mg Tb24 (Glipizide) .Marland Kitchen... Take one tablet by mouth twice a day    Metformin Hcl 1000 Mg Tabs (Metformin hcl) .Marland Kitchen... Take 1 tablet by mouth two times a day    Quinapril Hcl 10 Mg Tabs (Quinapril hcl) .Marland Kitchen... Take 1 tablet by mouth once a day    Lantus Solostar 100 Unit/ml Soln (Insulin glargine) .Marland KitchenMarland KitchenMarland KitchenMarland Kitchen 30 units once daily despite multiple attempts to have pt document and call in results of her sugars, to improve  control, she is repeatedly non compliant with this , and her sugars remain uncontrolled unfortunately Orders: T- Hemoglobin A1C ([id number])  Labs Reviewed: Creat: 0.68 (09/23/2009)    Reviewed HgBA1c results: 8.5 (09/23/2009)  8.7 (04/17/2009)  Problem # 6:  ESSENTIAL HYPERTENSION, BENIGN (ICD-401.1) Assessment: Unchanged  Her updated medication list for this problem includes:    Hydrochlorothiazide 25 Mg Tabs (Hydrochlorothiazide) .Marland Kitchen... Take one tablet by mouth once a day    Metoprolol Succinate 25 Mg Xr24h-tab (Metoprolol succinate) .Marland Kitchen... Take one tab by mouth two times a day    Verapamil Hcl Cr 180 Mg Cr-tabs (Verapamil hcl) ..... One cap by mouth once daily    Quinapril Hcl 10 Mg Tabs (Quinapril hcl) .Marland Kitchen... Take 1 tablet by mouth once a day  Orders: T-Basic Metabolic Panel ([id number])  BP today: 102/60 Prior BP: 120/76 (12/12/2009)  Labs Reviewed: K+: 4.0 (09/23/2009) Creat: : 0.68 (09/23/2009)   Chol: 148 (12/19/2008)   HDL: 41 (12/19/2008)   LDL: 83 (12/19/2008)  TG: 120 (12/19/2008)  Problem # 7:  OBESITY, UNSPECIFIED (ICD-278.00) Assessment: Unchanged  Ht: 63 (01/20/2010)   Wt: 204.75 (01/20/2010)   BMI: 36.40 (01/20/2010) therapeutic lifestyle change discussed and encouraged  Complete Medication List: 1)  Hydrochlorothiazide 25 Mg Tabs (Hydrochlorothiazide) .... Take one tablet by mouth once a day 2)  Glipizide Xl 10 Mg Tb24 (Glipizide) .... Take one tablet by mouth twice a day 3)  Metoprolol Succinate 25 Mg Xr24h-tab (Metoprolol succinate) .... Take one tab by mouth two times a day 4)  Imipramine Hcl 25 Mg Tabs (Imipramine hcl) .... Take four tab by mouth at bedtime 5)  Metformin Hcl 1000 Mg Tabs (Metformin hcl) .... Take 1 tablet by mouth two times a day 6)  Synthroid 200 Mcg Tabs (Levothyroxine sodium) .... One tab by mouth once daily 7)  Verapamil Hcl Cr 180 Mg Cr-tabs (Verapamil hcl) .... One cap by mouth once daily 8)  Nitrostat 0.4 Mg Subl (Nitroglycerin)  .... Uad 9)  Quinapril Hcl 10 Mg Tabs (Quinapril hcl) .... Take 1 tablet by mouth once a day 10)  Bd Pen Needle Short U/f 31g X 8 Mm Misc (Insulin pen needle) .... Once daily use 11)  Accu-chek Aviva Strp (Glucose blood) .... Once daily testing 12)  Lantus Solostar 100 Unit/ml Soln (Insulin glargine) .... 30 units once daily 13)  Loratadine 10 Mg Tabs (Loratadine) .... Take 1 tablet by mouth once a day as needed 14)  Pravastatin Sodium 80 Mg Tabs (Pravastatin sodium) .... One tab by mouth at bedtime discontinue lovastatin  Patient Instructions: 1)  Please schedule a follow-up appointment in 3 months.return in 2 days for flu vac 2)  It is important that you exercise regularly at least 20 minutes 5 times a week. If you develop chest pain, have severe difficulty breathing, or feel very tired , stop exercising immediately and seek medical attention. 3)  You need to lose weight. Consider a lower calorie diet and regular exercise.  4)  BMP prior to visit, ICD-9: 5)  HbgA1C prior to visit, ICD-9: today 6)  H pylori 7)  You are being referred for an [place] to look a t your gallbladder and liver. 8)  you are being referred to dr Jena Gauss Prescriptions: OMEPRAZOLE 40 MG CPDR (OMEPRAZOLE) Take 1 capsule by mouth once a day  #30 x 3   Entered and Authorized by:   Syliva Overman MD   Signed by:   Syliva Overman MD on 01/20/2010   Method used:   Electronically to        [facility] [phone]* (retail)       [address]       [place], [place]  [address]       Ph: [phone] or [phone]       Fax: [phone]   RxID:   [id number]    Orders Added: 1)  Est. Patient Level IV [34742] 2)  T-Basic Metabolic Panel [[id number]] 3)  T- Hemoglobin A1C [[id number]] 4)  TLB-H. Pylori Abs(Helicobacter Pylori) [86677-HELICO] 5)  Gastroenterology Referral [GI] 6)  Radiology Referral [Radiology] 7)  Medicare Electronic Prescription [phone]

## 2010-04-21 NOTE — Progress Notes (Signed)
Summary: [facility]   Imported By: Lind Guest 09/12/2009 10:15:30  _____________________________________________________________________  External Attachment:    Type:   Image     Comment:   External Document

## 2010-04-21 NOTE — Procedures (Signed)
Summary: Gastroenterology  Gastroenterology   Imported By: Lind Guest 09/24/2009 13:53:39  _____________________________________________________________________  External Attachment:    Type:   Image     Comment:   External Document

## 2010-04-21 NOTE — Assessment & Plan Note (Signed)
Summary: office visit   Vital Signs:  Patient profile:   65 year old female Menstrual status:  hysterectomy Height:      63 inches Weight:      194.13 pounds BMI:     34.51 Pulse rate:   85 / minute Pulse rhythm:   regular Resp:     16 per minute BP sitting:   120 / 70  (left arm)  Vitals Entered By: Worthy Keeler LPN (October 16, 2008 1:26 PM)  Nutrition Counseling: Patient's BMI is greater than 25 and therefore counseled on weight management options. CC: follow-up visit Is Patient Diabetic? Yes  Pain Assessment Patient in pain? no        CC:  follow-up visit.  History of Present Illness: Patient reports doing well.  Denies any recent fever or chills.  Denies any appetite change or change in bowel movements. Patient denies depression, anxiety or insomnia. She is still not exercising regularly and states that she remains too busy to take the time to focus on her diet like she should . Her fasting sugars range between 120 to 160. She does not take the metforin twice as prescibed more than half of the time, states she is afraid of bottoming out. She recntly was eval by card andis to return in 1 year, similarly for endocrine.    Current Medications (verified): 1)  Hydrochlorothiazide 25 Mg  Tabs (Hydrochlorothiazide) .... Take One Tablet By Mouth Once A Day 2)  Quinapril Hcl 10 Mg  Tabs (Quinapril Hcl) .... Take One Tablet By Mouth Once A Day 3)  Vytorin 10-40 Mg  Tabs (Ezetimibe-Simvastatin) .... Take One Tablet By Mouth Once A Day 4)  Glipizide Xl 10 Mg  Tb24 (Glipizide) .... Take One Tablet By Mouth Twice A Day 5)  Verapamil Hcl Cr 120 Mg  Cp24 (Verapamil Hcl) .... Take One Tablwet By Mouth Once Aday 6)  Spironolactone 50 Mg  Tabs (Spironolactone) .... Take One Tablet By Mouth Once A Day 7)  Metoprolol Succinate 25 Mg  Xr24h-Tab (Metoprolol Succinate) .... Take One Tab By Mouth Two Times A Day 8)  Imipramine Hcl 25 Mg  Tabs (Imipramine Hcl) .... Take Four Tab By Mouth At  Bedtime 9)  Metformin Hcl 1000 Mg Tabs (Metformin Hcl) .... Take 1 Tablet By Mouth Two Times A Day 10)  Onglyza 5 Mg Tabs (Saxagliptin Hcl) .... One Tab By Mouth Qd 11)  Synthroid 200 Mcg Tabs (Levothyroxine Sodium) .... One Tab By Mouth Qd  Allergies (verified): 1)  ! Sulfa 2)  ! Nsaids 3)  ! Asa  Review of Systems      See HPI General:  See HPI. ENT:  Denies hoarseness, nasal congestion, sinus pressure, and sore throat. CV:  Denies chest pain or discomfort, palpitations, and swelling of hands. Resp:  Denies cough and sputum productive. GI:  Denies abdominal pain, constipation, diarrhea, nausea, and vomiting. GU:  Denies dysuria and urinary frequency. MS:  Denies joint pain, low back pain, mid back pain, and stiffness. Neuro:  Complains of numbness; intermittent right grt toe numbness x 3 weks. Psych:  Denies anxiety and depression. Endo:  See HPI.  Physical Exam  General:  alert, well-hydrated, and overweight-appearing. HEENT: No facial asymmetry,  EOMI, No sinus tenderness, TM's Clear, oropharynx  pink and moist.   Chest: Clear to auscultation bilaterally.  CVS: S1, S2, No murmurs, No S3.   Abd: Soft, Nontender.  MS: Adequate ROM spine, hips, shoulders and knees.  Ext: No edema.  CNS: CN 2-12 intact, power tone and sensation normal throughout.   Skin: Intact, no visible lesions or rashes.  Psych: Good eye contact, normal affect.  Memory intact, not anxious or depressed appearing.     Diabetes Management Exam:    Foot Exam (with socks and/or shoes not present):       Sensory-Monofilament:          Left foot: normal          Right foot: normal       Inspection:          Left foot: normal          Right foot: normal       Nails:          Left foot: normal          Right foot: normal   Impression & Recommendations:  Problem # 1:  OBESITY, UNSPECIFIED (ICD-278.00) Assessment Deteriorated  Ht: 63 (10/16/2008)   Wt: 194.13 (10/16/2008)   BMI: 34.51 (10/16/2008)   Problem # 2:  HYPERLIPIDEMIA (ICD-272.4) Assessment: Comment Only  Her updated medication list for this problem includes:    Vytorin 10-40 Mg Tabs (Ezetimibe-simvastatin) .Marland Kitchen... Take one tablet by mouth once a day  Orders: T-Lipid Profile [phone]) T-Hepatic Function [phone])  Labs Reviewed: SGOT: 22 (12/15/2006)   SGPT: 29 (12/15/2006)   HDL:36 (09/21/2006), 40 (06/17/2006)  LDL:40 (09/21/2006), 117 (23/55/7322)  Chol:97 (09/21/2006), 189 (06/17/2006)  Trig:104 (09/21/2006), 160 (06/17/2006)  Problem # 3:  DIABETES MELLITUS, TYPE II, WITHOUT COMPLICATIONS (ICD-250.00) Assessment: Unchanged  The following medications were removed from the medication list:    Actos 30 Mg Tabs (Pioglitazone hcl) .Marland Kitchen... Take 1 tablet by mouth once a day    Onglyza 5 Mg Tabs (Saxagliptin hcl) ..... One tab by mouth qd Her updated medication list for this problem includes:    Quinapril Hcl 10 Mg Tabs (Quinapril hcl) .Marland Kitchen... Take one tablet by mouth once a day    Glipizide Xl 10 Mg Tb24 (Glipizide) .Marland Kitchen... Take one tablet by mouth twice a day    Metformin Hcl 1000 Mg Tabs (Metformin hcl) .Marland Kitchen... Take 1 tablet by mouth two times a day    Onglyza 5 Mg Tabs (Saxagliptin hcl) ..... One tab by mouth qd  Orders: Glucose, (CBG) (82962) Hemoglobin A1C (83036) T-Urine Microalbumin w/creat. ratio [phone] / [id number])  Labs Reviewed: Creat: 0.67 (04/11/2008)    Reviewed HgBA1c results: 7.7 (10/16/2008)  7.7 (07/17/2008)  Problem # 4:  ESSENTIAL HYPERTENSION, BENIGN (ICD-401.1) Assessment: Unchanged  Her updated medication list for this problem includes:    Hydrochlorothiazide 25 Mg Tabs (Hydrochlorothiazide) .Marland Kitchen... Take one tablet by mouth once a day    Quinapril Hcl 10 Mg Tabs (Quinapril hcl) .Marland Kitchen... Take one tablet by mouth once a day    Verapamil Hcl Cr 120 Mg Cp24 (Verapamil hcl) .Marland Kitchen... Take one tablwet by mouth once aday    Spironolactone 50 Mg Tabs (Spironolactone) .Marland Kitchen... Take one tablet by mouth once a  day    Metoprolol Succinate 25 Mg Xr24h-tab (Metoprolol succinate) .Marland Kitchen... Take one tab by mouth two times a day  Orders: T-Basic Metabolic Panel ([phone])  BP today: 120/70 Prior BP: 118/70 (08/05/2008)  Labs Reviewed: K+: 4.1 (04/11/2008) Creat: : 0.67 (04/11/2008)   Chol: 97 (09/21/2006)   HDL: 36 (09/21/2006)   LDL: 40 (09/21/2006)   TG: 104 (09/21/2006)  Complete Medication List: 1)  Hydrochlorothiazide 25 Mg Tabs (Hydrochlorothiazide) .... Take one tablet by mouth once a day 2)  Quinapril  Hcl 10 Mg Tabs (Quinapril hcl) .... Take one tablet by mouth once a day 3)  Vytorin 10-40 Mg Tabs (Ezetimibe-simvastatin) .... Take one tablet by mouth once a day 4)  Glipizide Xl 10 Mg Tb24 (Glipizide) .... Take one tablet by mouth twice a day 5)  Verapamil Hcl Cr 120 Mg Cp24 (Verapamil hcl) .... Take one tablwet by mouth once aday 6)  Spironolactone 50 Mg Tabs (Spironolactone) .... Take one tablet by mouth once a day 7)  Metoprolol Succinate 25 Mg Xr24h-tab (Metoprolol succinate) .... Take one tab by mouth two times a day 8)  Imipramine Hcl 25 Mg Tabs (Imipramine hcl) .... Take four tab by mouth at bedtime 9)  Metformin Hcl 1000 Mg Tabs (Metformin hcl) .... Take 1 tablet by mouth two times a day 10)  Onglyza 5 Mg Tabs (Saxagliptin hcl) .... One tab by mouth qd 11)  Synthroid 200 Mcg Tabs (Levothyroxine sodium) .... One tab by mouth qd  Patient Instructions: 1)  F/U mid Sept . 2)  BMP prior to visit, ICD-9: 3)  Hepatic Panel prior to visit, ICD-9:  fasting in Sept 4)  Lipid Panel prior to visit, ICD-9 5)  Pls take your meds EVERY day as prescribed, pLS call and lv me a msg to call you in TWO weeks so we can discuss your blood sugars., and make a decision about insulin.: 6)  It is important that you exercise regularly at least 30 minutes 6 times a week. If you develop chest pain, have severe difficulty breathing, or feel very tired , stop exercising immediately and seek medical attention. 7)   You need to lose weight. Consider a lower calorie diet and regular exercise.  8)  See your eye doctor yearly to check for diabetic eye damage. Prescriptions: VYTORIN 10-40 MG  TABS (EZETIMIBE-SIMVASTATIN) Take one tablet by mouth once a day  #30 x 5   Entered by:   Worthy Keeler LPN   Authorized by:   Syliva Overman MD   Signed by:   Worthy Keeler LPN on 44/05/4740   Method used:   [facility] to        [facility]. [phone]* (retail)       [address]       [place], [place]  [address]       Ph: [phone] or [phone]       Fax: [phone]   RxID:   [id number]   Laboratory Results   Blood Tests   Date/Time Received: 10/16/08 1:31am Date/Time Reported: 10/16/08 1:31am  Glucose (random): 167 mg/dL   (Normal Range: 31-517) HGBA1C: 7.7%   (Normal Range: Non-Diabetic - 3-6%   Control Diabetic - 6-8%)

## 2010-04-21 NOTE — Assessment & Plan Note (Signed)
Summary: PAIN - room 1   Vital Signs:  Patient profile:   65 year old female Menstrual status:  hysterectomy Height:      63 inches Weight:      201.50 pounds BMI:     35.82 O2 Sat:      99 % on Room air Pulse rate:   83 / minute Resp:     16 per minute BP sitting:   110 / 60  (left arm)  Vitals Entered By: Adella Hare LPN (July 18, 2009 9:12 AM) CC: right side and right chest/breast pain, comes and goes Is Patient Diabetic? Yes Pain Assessment Patient in pain? no      Comments patient did not bring in her medications today   CC:  right side and right chest/breast pain and comes and goes.  History of Present Illness: Pt states that for about 1 week she is getting intermiitent sharp pain in her Rt upper chest.  Lasts for only a few secs then gone.  Not assoc with  physical activity, mvmt, eating etc.  No trauma.  But had a cough for a couple of weeks, that is now almost resolved.  No difficulty breathing. Does awaken her from sleep. She feels the pain in her Rt upper back too.  No radiation to UE.    No breast tenderness to touch, or lumps.  Breast exam & mamm due soon thru GYN.  Pt was recently ( in the last week) seen by the cardiologist for her routine check up.  Doing well.   Allergies (verified): 1)  ! Sulfa 2)  ! Nsaids 3)  ! Jonne Ply  Past History:  Past medical history reviewed for relevance to current acute and chronic problems.  Past Medical History: Reviewed history from 11/23/2007 and no changes required.   DEGENERATIVE JOINT DISEASE, LUMBAR SPINE (ICD-721.90) BACK PAIN (ICD-724.5) HEADACHE, CHRONIC (ICD-784.0) HYPERLIPIDEMIA (ICD-272.4) CAD (ICD-414.00) DIABETES MELLITUS, TYPE II, WITHOUT COMPLICATIONS (ICD-250.00) ESSENTIAL HYPERTENSION, BENIGN (ICD-401.1) CARPAL TUNNEL SYNDROME, BILATERAL (ICD-354.0) Recurrent thyroid cancer  Review of Systems General:  Denies chills and fever. CV:  Complains of chest pain or discomfort; denies palpitations,  shortness of breath with exertion, and swelling of feet. Resp:  Denies shortness of breath. GI:  Denies abdominal pain, change in bowel habits, indigestion, nausea, and vomiting. GU:  Denies dysuria. MS:  Complains of mid back pain. Derm:  Complains of lesion(s) and rash. Neuro:  Denies numbness and tingling.  Physical Exam  General:  Well-developed,well-nourished,in no acute distress; alert,appropriate and cooperative throughout examination Head:  Normocephalic and atraumatic without obvious abnormalities. No apparent alopecia or balding. Ears:  External ear exam shows no significant lesions or deformities.  Otoscopic examination reveals clear canals, tympanic membranes are intact bilaterally without bulging, retraction, inflammation or discharge. Hearing is grossly normal bilaterally. Nose:  External nasal examination shows no deformity or inflammation. Nasal mucosa are pink and moist without lesions or exudates. Mouth:  Oral mucosa and oropharynx without lesions or exudates.  Neck:  No deformities, masses, or tenderness noted. Chest Wall:  no deformities, no tenderness, and no mass.   Lungs:  Normal respiratory effort, chest expands symmetrically. Lungs are clear to auscultation, no crackles or wheezes. Heart:  Normal rate and regular rhythm. S1 and S2 normal without gallop, murmur, click, rub or other extra sounds. Abdomen:  Bowel sounds positive,abdomen soft and non-tender without masses, organomegaly or hernias noted. Msk:  Thoracic spine & musculature nontender to palp. PAIN WAS REPRODUCED THOUGH WHEN PT CHANGED POSITIONS FROM  LYING TO SITTING. Pulses:  R radial normal and L radial normal.   Extremities:  No clubbing, cyanosis, edema, or deformity noted with normal full range of motion of all joints bilat UE.   Skin:  Intact without suspicious lesions or rashes Cervical Nodes:  No lymphadenopathy noted Psych:  Cognition and judgment appear intact. Alert and cooperative with normal  attention span and concentration. No apparent delusions, illusions, hallucinations   Impression & Recommendations:  Problem # 1:  CHEST PAIN UNSPECIFIED (ICD-786.50) Assessment New Probable chest wall pain, due to recent cough.  Orders: Depo- Medrol 80mg  (J1040) Admin of Therapeutic Inj  intramuscular or subcutaneous (44010)  Problem # 2:  ESSENTIAL HYPERTENSION, BENIGN (ICD-401.1) Assessment: Comment Only  Her updated medication list for this problem includes:    Hydrochlorothiazide 25 Mg Tabs (Hydrochlorothiazide) .Marland Kitchen... Take one tablet by mouth once a day    Spironolactone 50 Mg Tabs (Spironolactone) .Marland Kitchen... Take one tablet by mouth once a day    Metoprolol Succinate 25 Mg Xr24h-tab (Metoprolol succinate) .Marland Kitchen... Take one tab by mouth two times a day    Verapamil Hcl Cr 180 Mg Cr-tabs (Verapamil hcl) ..... One cap by mouth qd    Quinapril Hcl 10 Mg Tabs (Quinapril hcl) .Marland Kitchen... Take 1 tablet by mouth once a day  BP today: 110/60 Prior BP: 114/74 (06/19/2009)  Labs Reviewed: K+: 4.3 (12/19/2008) Creat: : 0.84 (12/19/2008)   Chol: 148 (12/19/2008)   HDL: 41 (12/19/2008)   LDL: 83 (12/19/2008)   TG: 120 (12/19/2008)  Complete Medication List: 1)  Hydrochlorothiazide 25 Mg Tabs (Hydrochlorothiazide) .... Take one tablet by mouth once a day 2)  Vytorin 10-40 Mg Tabs (Ezetimibe-simvastatin) .... Take one tablet by mouth once a day 3)  Glipizide Xl 10 Mg Tb24 (Glipizide) .... Take one tablet by mouth twice a day 4)  Spironolactone 50 Mg Tabs (Spironolactone) .... Take one tablet by mouth once a day 5)  Metoprolol Succinate 25 Mg Xr24h-tab (Metoprolol succinate) .... Take one tab by mouth two times a day 6)  Imipramine Hcl 25 Mg Tabs (Imipramine hcl) .... Take four tab by mouth at bedtime 7)  Metformin Hcl 1000 Mg Tabs (Metformin hcl) .... Take 1 tablet by mouth two times a day 8)  Onglyza 5 Mg Tabs (Saxagliptin hcl) .... One tab by mouth qd 9)  Synthroid 200 Mcg Tabs (Levothyroxine sodium)  .... One tab by mouth qd 10)  Verapamil Hcl Cr 180 Mg Cr-tabs (Verapamil hcl) .... One cap by mouth qd 11)  Hydrocodone-acetaminophen 5-500 Mg Tabs (Hydrocodone-acetaminophen) .... One to two tabs by mouth every 4 to 6 hours as needed for pain 12)  Nitrostat 0.4 Mg Subl (Nitroglycerin) .... Uad 13)  Zithromax 250 Mg Tabs (Azithromycin) .... Take 2 tablets today, then one tablet daily for the next 4 days 14)  Paroxetine Hcl 10 Mg Tabs (Paroxetine hcl) .... Take 1 tablet by mouth once a day 15)  Quinapril Hcl 10 Mg Tabs (Quinapril hcl) .... Take 1 tablet by mouth once a day 16)  Prednisone 5 Mg Tabs (Prednisone) .... One tablet 3 times daily for 2 days, then one tablet 2 times daily for 2 days , then one tab daily for 3 days 17)  Lantus Solostar 100 Unit/ml Soln (Insulin glargine) .... 20 units at bedtime 18)  Bd Pen Needle Short U/f 31g X 8 Mm Misc (Insulin pen needle) .... Once daily use 19)  Accu-chek Aviva Strp (Glucose blood) .... Once daily testing 20)  Lantus Solostar 100  Unit/ml Soln (Insulin glargine) .... 35 units every night at bedtime 21)  Tramadol Hcl 50 Mg Tabs (Tramadol hcl) .... Take 1 every 6 hrs as needed for pain  Patient Instructions: 1)  Please schedule a follow-up appointment as needed. 2)  Take 650-1000mg  of Tylenol every 4-6 hours as needed for relief of pain or comfort of fever AVOID taking more than 4000mg   in a 24 hour period (can cause liver damage in higher doses). 3)  I have also prescribed Tramadol as needed for pain. Prescriptions: TRAMADOL HCL 50 MG TABS (TRAMADOL HCL) take 1 every 6 hrs as needed for pain  #30 x 0   Entered and Authorized by:   Esperanza Sheets PA   Signed by:   Esperanza Sheets PA on 07/18/2009   Method used:   [facility] to        [facility] [phone]* (retail)       [address]       [place], [place]  [address]       Ph: [phone] or [phone]       Fax: [phone]   RxID:   [id number]    Medication  Administration  Injection # 1:    Medication: Depo- Medrol 80mg     Diagnosis: CHEST PAIN UNSPECIFIED (ICD-786.50)    Route: IM    Site: RUOQ gluteus    Exp Date: 12/11    Lot #: [id number]    Mfr: Pharmacia    Patient tolerated injection without complications    Given by: Adella Hare LPN (July 18, 2009 9:54 AM)  Orders Added: 1)  Depo- Medrol 80mg  [J1040] 2)  Admin of Therapeutic Inj  intramuscular or subcutaneous [96372] 3)  Est. Patient Level IV [27035]

## 2010-04-21 NOTE — Assessment & Plan Note (Signed)
Summary: OV   Vital Signs:  Patient profile:   65 year old female Menstrual status:  hysterectomy Height:      63 inches Weight:      205 pounds BMI:     36.45 O2 Sat:      99 % on Room air Pulse rate:   72 / minute Pulse rhythm:   regular Resp:     16 per minute BP sitting:   102 / 66  (left arm)  Vitals Entered By: Worthy Keeler LPN (February 06, 2009 8:45 AM)  Nutrition Counseling: Patient's BMI is greater than 25 and therefore counseled on weight management options.  O2 Flow:  Room air CC: follow-up visit- left leg pain off and on and  back pain Is Patient Diabetic? Yes Did you bring your meter with you today? No Pain Assessment Patient in pain? no        CC:  follow-up visit- left leg pain off and on and  back pain.  History of Present Illness: Pt reports intermittent back pain radiating to legs, which has flared up in the past 5 days. she has established disc disease in her low back. Reports  that she had been doing well prior to this Denies recent fever or chills. Denies sinus pressure, nasal congestion , ear pain or sore throat. Denies chest congestion, or cough productive of sputum. Denies chest pain, palpitations, PND, orthopnea or leg swelling. Denies abdominal pain, nausea, vomitting, diarrhea or constipation. Denies change in bowel movements or bloody stool. Denies dysuria , frequency, incontinence or hesitancy.   Denies  rash, lesions, or itch.     Allergies (verified): 1)  ! Sulfa 2)  ! Nsaids 3)  ! Asa  Review of Systems      See HPI MS:  Complains of low back pain and mid back pain; 5 day h/o low back pain radiating to left thigh, excessive pain disturbing evn her ability to walk, no numbness or incontinence. Neuro:  Complains of headaches; denies seizures and sensation of room spinning; currently stable. Psych:  Complains of anxiety and depression; denies suicidal thoughts/plans, thoughts of violence, and unusual visions or sounds; improved  has seen therapy once. Endo:  Denies cold intolerance, excessive hunger, excessive thirst, excessive urination, heat intolerance, polyuria, and weight change; fasting blood sugars are between 120 to 140.  Physical Exam  General:  Well-developedobese,in no acute distress; alert,appropriate and cooperative throughout examination HEENT: No facial asymmetry,  EOMI, No sinus tenderness, TM's Clear, oropharynx  pink and moist.   Chest: Clear to auscultation bilaterally.  CVS: S1, S2, No murmurs, No S3.   Abd: Soft, Nontender.  OZ:HYQMVHQIO ROM thoracolumbar spine,adequate in hips, shoulders and knees.  Ext: No edema.   CNS: CN 2-12 intact, power tone and sensation normal throughout.   Skin: Intact, no visible lesions or rashes.  Psych: Good eye contact, normal affect.  Memory intact, not anxious or depressed appearing.   Diabetes Management Exam:    Foot Exam (with socks and/or shoes not present):       Sensory-Monofilament:          Left foot: normal          Right foot: normal       Inspection:          Left foot: normal          Right foot: normal       Nails:          Left foot: normal  Right foot: normal   Impression & Recommendations:  Problem # 1:  DEPRESSION (ICD-311) Assessment Improved  Her updated medication list for this problem includes:    Imipramine Hcl 25 Mg Tabs (Imipramine hcl) .Marland Kitchen... Take four tab by mouth at bedtime    Paroxetine Hcl 10 Mg Tabs (Paroxetine hcl) .Marland Kitchen... Take 1 tablet by mouth once a day, p;t now in counselling  Problem # 2:  OBESITY, UNSPECIFIED (ICD-278.00) Assessment: Unchanged  Ht: 63 (02/06/2009)   Wt: 205 (02/06/2009)   BMI: 36.45 (02/06/2009)  Problem # 3:  DEGENERATIVE JOINT DISEASE, LUMBAR SPINE (ICD-721.90) Assessment: Deteriorated  Problem # 4:  ESSENTIAL HYPERTENSION, BENIGN (ICD-401.1) Assessment: Improved  Her updated medication list for this problem includes:    Hydrochlorothiazide 25 Mg Tabs (Hydrochlorothiazide)  .Marland Kitchen... Take one tablet by mouth once a day    Spironolactone 50 Mg Tabs (Spironolactone) .Marland Kitchen... Take one tablet by mouth once a day    Metoprolol Succinate 25 Mg Xr24h-tab (Metoprolol succinate) .Marland Kitchen... Take one tab by mouth two times a day    Verapamil Hcl Cr 180 Mg Cr-tabs (Verapamil hcl) ..... One cap by mouth qd    Quinapril Hcl 10 Mg Tabs (Quinapril hcl) .Marland Kitchen... Take 1 tablet by mouth once a day  Orders: T-Basic Metabolic Panel [phone])  BP today: 102/66 Prior BP: 140/80 (01/09/2009)  Labs Reviewed: K+: 4.3 (12/19/2008) Creat: : 0.84 (12/19/2008)   Chol: 148 (12/19/2008)   HDL: 41 (12/19/2008)   LDL: 83 (12/19/2008)   TG: 120 (12/19/2008)  Problem # 5:  DIABETES MELLITUS, TYPE II, WITHOUT COMPLICATIONS (ICD-250.00) Assessment: Unchanged  Her updated medication list for this problem includes:    Glipizide Xl 10 Mg Tb24 (Glipizide) .Marland Kitchen... Take one tablet by mouth twice a day    Metformin Hcl 1000 Mg Tabs (Metformin hcl) .Marland Kitchen... Take 1 tablet by mouth two times a day    Onglyza 5 Mg Tabs (Saxagliptin hcl) ..... One tab by mouth qd    Quinapril Hcl 10 Mg Tabs (Quinapril hcl) .Marland Kitchen... Take 1 tablet by mouth once a day  Orders: Glucose, (CBG) (82962) T- Hemoglobin A1C (09811-91478)  Labs Reviewed: Creat: 0.84 (12/19/2008)    Reviewed HgBA1c results: 7.5 (01/09/2009)  7.7 (10/16/2008)  Complete Medication List: 1)  Hydrochlorothiazide 25 Mg Tabs (Hydrochlorothiazide) .... Take one tablet by mouth once a day 2)  Vytorin 10-40 Mg Tabs (Ezetimibe-simvastatin) .... Take one tablet by mouth once a day 3)  Glipizide Xl 10 Mg Tb24 (Glipizide) .... Take one tablet by mouth twice a day 4)  Spironolactone 50 Mg Tabs (Spironolactone) .... Take one tablet by mouth once a day 5)  Metoprolol Succinate 25 Mg Xr24h-tab (Metoprolol succinate) .... Take one tab by mouth two times a day 6)  Imipramine Hcl 25 Mg Tabs (Imipramine hcl) .... Take four tab by mouth at bedtime 7)  Metformin Hcl 1000 Mg Tabs  (Metformin hcl) .... Take 1 tablet by mouth two times a day 8)  Onglyza 5 Mg Tabs (Saxagliptin hcl) .... One tab by mouth qd 9)  Synthroid 200 Mcg Tabs (Levothyroxine sodium) .... One tab by mouth qd 10)  Verapamil Hcl Cr 180 Mg Cr-tabs (Verapamil hcl) .... One cap by mouth qd 11)  Hydrocodone-acetaminophen 5-500 Mg Tabs (Hydrocodone-acetaminophen) .... One to two tabs by mouth every 4 to 6 hours as needed for pain 12)  Nitrostat 0.4 Mg Subl (Nitroglycerin) .... Uad 13)  Zithromax 250 Mg Tabs (Azithromycin) .... Take 2 tablets today, then one tablet daily for the next  4 days 14)  Paroxetine Hcl 10 Mg Tabs (Paroxetine hcl) .... Take 1 tablet by mouth once a day 15)  Quinapril Hcl 10 Mg Tabs (Quinapril hcl) .... Take 1 tablet by mouth once a day 16)  Prednisone 5 Mg Tabs (Prednisone) .... One tablet 3 times daily for 2 days, then one tablet 2 times daily for 2 days , then one tab daily for 3 days  Other Orders: T-Hepatic Function [phone]) T-Lipid Profile [phone])  Patient Instructions: 1)  F/U end January. 2)  BMP prior to visit, ICD-9: 3)  Hepatic Panel prior to visit, ICD-9:   fasting end January 4)  Lipid Panel prior to visit, ICD-9: 5)  HbgA1C prior to visit, ICD-9: 6)  The pain imn you leg and low back is from arthritis with disc disease, meds are sentin, pls call if no better for a rept MRI, andreferral to Dr. Venetia Maxon.  Prescriptions: HYDROCODONE-ACETAMINOPHEN 5-500 MG TABS (HYDROCODONE-ACETAMINOPHEN) one to two tabs by mouth every 4 to 6 hours as needed for pain  #90 x 1   Entered by:   Everitt Amber   Authorized by:   Syliva Overman MD   Signed by:   Everitt Amber on 02/06/2009   Method used:   Printed then faxed to ...       [facility]. [phone]* (retail)       [address]       [place], [place]  [address]       Ph: [phone] or [phone]       Fax: [phone]   RxID:   [id number] PREDNISONE 5 MG TABS (PREDNISONE) one tablet 3 times daily  for 2 days, then one tablet 2 times daily for 2 days , then one tab daily for 3 days  #13 x 0   Entered and Authorized by:   Syliva Overman MD   Signed by:   Syliva Overman MD on 02/06/2009   Method used:   Electronically to        [facility] [phone]* (retail)       [address]       [place], [place]  [address]       Ph: [phone] or [phone]       Fax: [phone]   RxID:   [id number]   Laboratory Results   Blood Tests   Date/Time Received: 02/06/09 Date/Time Reported: 02/06/09  Glucose (random): 134 mg/dL   (Normal Range: 85-462)

## 2010-04-21 NOTE — Progress Notes (Signed)
[facility]   Imported By: Lind Guest 08/05/2009 13:54:50  _____________________________________________________________________  External Attachment:    Type:   Image     Comment:   External Document

## 2010-04-21 NOTE — Progress Notes (Signed)
[facility]   Imported By: Lind Guest 11/29/2008 14:39:28  _____________________________________________________________________  External Attachment:    Type:   Image     Comment:   External Document

## 2010-04-21 NOTE — Progress Notes (Signed)
  Phone Note From Pharmacy   Caller: [facility] [address] [phone]* Summary of Call: lovastatin may have interact with verapamil Initial call taken by: Adella Hare LPN,  December 17, 2009 2:10 PM  Follow-up for Phone Call        Change to Pravastatin 80 mg one daily #30 x 3 rf. Follow-up by: Esperanza Sheets PA,  December 17, 2009 2:38 PM  Additional Follow-up for Phone Call Additional follow up Details #1::        Prescription resent Additional Follow-up by: Adella Hare LPN,  December 17, 2009 3:00 PM    New/Updated Medications: PRAVASTATIN SODIUM 80 MG TABS (PRAVASTATIN SODIUM) one tab by mouth at bedtime discontinue lovastatin Prescriptions: PRAVASTATIN SODIUM 80 MG TABS (PRAVASTATIN SODIUM) one tab by mouth at bedtime discontinue lovastatin  #30 x 3   Entered by:   Adella Hare LPN   Authorized by:   Esperanza Sheets PA   Signed by:   Adella Hare LPN on 19/14/7829   Method used:   Electronically to        [facility]. [phone]* (retail)       [address]       [place], [place]  [address]       Ph: [phone] or [phone]       Fax: [phone]   RxID:   [id number]

## 2010-04-21 NOTE — Consult Note (Signed)
Summary: [facility]SE [facility]RO[facility]  [facility]RO[facility]   Imported By: Lind Guest 11/14/2007 10:27:27  _____________________________________________________________________  External Attachment:    Type:   Image     Comment:   External Document

## 2010-04-21 NOTE — Progress Notes (Signed)
Summary: adult echocardiography  adult echocardiography   Imported By: Lind Guest 08/14/2009 08:01:05  _____________________________________________________________________  External Attachment:    Type:   Image     Comment:   External Document

## 2010-04-21 NOTE — Miscellaneous (Signed)
Summary: med  Clinical Lists Changes  Medications: Added new medication of LANTUS SOLOSTAR 100 UNIT/ML SOLN (INSULIN GLARGINE) inject 20 units subcutaneously at bedtime - Signed Added new medication of BD PEN NEEDLE SHORT U/F 31G X 8 MM MISC (INSULIN PEN NEEDLE) once daily use - Signed Rx of LANTUS SOLOSTAR 100 UNIT/ML SOLN (INSULIN GLARGINE) inject 20 units subcutaneously at bedtime;  #600units x 2;  Signed;  Entered by: Adella Hare LPN;  Authorized by: Syliva Overman MD;  Method used: Electronically to [facility]. [phone]*, [address], [place], [place], [place]  [address], Ph: [phone] or [phone], Fax: [phone] Rx of BD PEN NEEDLE SHORT U/F 31G X 8 MM MISC (INSULIN PEN NEEDLE) once daily use;  #100 x 2;  Signed;  Entered by: Adella Hare LPN;  Authorized by: Syliva Overman MD;  Method used: Electronically to [facility]. [phone]*, [address], [place], [place], [place]  [address], Ph: [phone] or [phone], Fax: [phone]    Prescriptions: BD PEN NEEDLE SHORT U/F 31G X 8 MM MISC (INSULIN PEN NEEDLE) once daily use  #100 x 2   Entered by:   Adella Hare LPN   Authorized by:   Syliva Overman MD   Signed by:   Adella Hare LPN on 84/16/6063   Method used:   Electronically to        [facility]. [phone]* (retail)       [address]       [place], [place]  [address]       Ph: [phone] or [phone]       Fax: [phone]   RxID:   [id number] LANTUS SOLOSTAR 100 UNIT/ML SOLN (INSULIN GLARGINE) inject 20 units subcutaneously at bedtime  #600units x 2   Entered by:   Adella Hare LPN   Authorized by:   Syliva Overman MD   Signed by:   Adella Hare LPN on 03/50/0938   Method used:   Electronically to        [facility]. [phone]* (retail)       [address]       [place], [place]  [address]       Ph: [phone] or [phone]       Fax: [phone]   RxID:   [id number]

## 2010-04-21 NOTE — Assessment & Plan Note (Signed)
Summary: OFFICE VISIT   Vital Signs:  Patient Profile:   65 Years Old Female Height:     63 inches (160.02 cm) Weight:      196 pounds Temp:     98.0 degrees F oral Pulse rate:   69 / minute BP sitting:   110 / 70  (left arm) Cuff size:   regular  Vitals Entered By: Harlene Salts (December 06, 2007 2:31 PM)                 Chief Complaint:  Cold & URI symptoms.  History of Present Illness: Four day h/o head and chest congestion with yellow nasal drainage and yellow sputum.no fer or chills. Pt. denies polyuria,polydypsia or hypoglycemic episodes.  Ms. Vosler is currently experiencing anxiety and confusion over her proposed surgery for recurrent thyroid cancer since she has had 2 different recommendations by 2 different specialists. i have offered to contact her primary endocrinologist to assist in sorting out this unfortunate circumstance and she is appreciative.    Current Allergies: ! SULFA ! IBUPROFEN ! ASA     Review of Systems  ENT      Complains of nasal congestion and sinus pressure.      Denies earache, hoarseness, and sore throat.  CV      Denies chest pain or discomfort, palpitations, shortness of breath with exertion, swelling of feet, and swelling of hands.  Resp      Denies cough, shortness of breath, sputum productive, and wheezing.  GI      Denies abdominal pain, constipation, diarrhea, nausea, and vomiting.  GU      Denies dysuria and urinary frequency.  MS      Denies joint pain, joint swelling, muscle weakness, and stiffness.  Psych      Complains of anxiety and depression.      Denies suicidal thoughts/plans, thoughts of violence, and unusual visions or sounds.  Endo      Denies excessive thirst and polyuria.   Physical Exam  General:     overweight-appearing.   Head:     positive frontal and maxillary sinus tenderness. Ears:     External ear exam shows no significant lesions or deformities.  Otoscopic examination reveals  clear canals, tympanic membranes are intact bilaterally without bulging, retraction, inflammation or discharge. Hearing is grossly normal bilaterally. Nose:     no external deformity and nasal dischargemucosal pallor.   Mouth:     Oral mucosa and oropharynx without lesions or exudates.  Teeth in good repair. Neck:     No deformities, masses, or tenderness noted. Lungs:     Normal respiratory effort, chest expands symmetrically. Lungs are clear to auscultation, no crackles or wheezes. Heart:     Normal rate and regular rhythm. S1 and S2 normal without gallop, murmur, click, rub or other extra sounds. Abdomen:     soft and non-tender.   Extremities:     No clubbing, cyanosis, edema, or deformity noted with normal full range of motion of all joints.   Skin:     Intact without suspicious lesions or rashes Cervical Nodes:     No lymphadenopathy noted Psych:     Cognition and judgment appear intact. Alert and cooperative with normal attention span and concentration. No apparent delusions, illusions, hallucinations  Diabetes Management Exam:    Foot Exam (with socks and/or shoes not present):       Sensory-Monofilament:          Left foot: normal  Right foot: normal       Inspection:          Left foot: normal          Right foot: normal       Nails:          Left foot: normal          Right foot: normal    Impression & Recommendations:  Problem # 1:  OTHER ACUTE SINUSITIS (ICD-461.8) Assessment: Comment Only  Her updated medication list for this problem includes:    Penicillin V Potassium 500 Mg Tabs (Penicillin v potassium) .Marland Kitchen... Take 1 tablet by mouth three times a day   Problem # 2:  HYPERLIPIDEMIA (ICD-272.4) Assessment: Comment Only  Her updated medication list for this problem includes:    Vytorin 10-40 Mg Tabs (Ezetimibe-simvastatin) .Marland Kitchen... Take one tablet by mouth once a day  Labs Reviewed: Chol: 97 (09/21/2006)   HDL: 36 (09/21/2006)   LDL: 40  (09/21/2006)   TG: 104 (09/21/2006) SGOT: 22 (12/15/2006)   SGPT: 29 (12/15/2006)   Problem # 3:  DIABETES MELLITUS, TYPE II, WITHOUT COMPLICATIONS (ICD-250.00) Assessment: Comment Only  Her updated medication list for this problem includes:    Quinapril Hcl 10 Mg Tabs (Quinapril hcl) .Marland Kitchen... Take one tablet by mouth once a day    Glipizide Xl 10 Mg Tb24 (Glipizide) .Marland Kitchen... Take one tablet by mouth twice a day    Janumet 50-1000 Mg Tabs (Sitagliptin-metformin hcl) .Marland Kitchen... Take one tablet by mouth twice a day  Labs Reviewed: HgBA1c: 7.9 (09/21/2006)   Creat: 0.80 (12/15/2006)   Microalbumin: 13.60 (09/11/2007) HBA1C at end of September   Problem # 4:  CAD (ICD-414.00) Assessment: Unchanged  Her updated medication list for this problem includes:    Hydrochlorothiazide 25 Mg Tabs (Hydrochlorothiazide) .Marland Kitchen... Take one tablet by mouth once a day    Quinapril Hcl 10 Mg Tabs (Quinapril hcl) .Marland Kitchen... Take one tablet by mouth once a day    Verapamil Hcl Cr 120 Mg Cp24 (Verapamil hcl) .Marland Kitchen... Take one tablwet by mouth once aday    Spironolactone 50 Mg Tabs (Spironolactone) .Marland Kitchen... Take one tablet by mouth once a day    Metoprolol Succinate 25 Mg Xr24h-tab (Metoprolol succinate) .Marland Kitchen... Take one tab by mouth two times a day   Complete Medication List: 1)  Hydrochlorothiazide 25 Mg Tabs (Hydrochlorothiazide) .... Take one tablet by mouth once a day 2)  Quinapril Hcl 10 Mg Tabs (Quinapril hcl) .... Take one tablet by mouth once a day 3)  Vytorin 10-40 Mg Tabs (Ezetimibe-simvastatin) .... Take one tablet by mouth once a day 4)  Glipizide Xl 10 Mg Tb24 (Glipizide) .... Take one tablet by mouth twice a day 5)  Janumet 50-1000 Mg Tabs (Sitagliptin-metformin hcl) .... Take one tablet by mouth twice a day 6)  Synthroid 175 Mcg Tabs (Levothyroxine sodium) .... Take one tablet by mouth every morning 7)  Verapamil Hcl Cr 120 Mg Cp24 (Verapamil hcl) .... Take one tablwet by mouth once aday 8)  Xenical 120 Mg Caps  (Orlistat) .... Take 1 tablet by mouth three times a day 9)  Spironolactone 50 Mg Tabs (Spironolactone) .... Take one tablet by mouth once a day 10)  Metoprolol Succinate 25 Mg Xr24h-tab (Metoprolol succinate) .... Take one tab by mouth two times a day 11)  Imipramine Hcl 25 Mg Tabs (Imipramine hcl) .... Take four tab by mouth at bedtime 12)  Singulair 10 Mg Tabs (Montelukast sodium) .... One tab by mouth qd  13)  Penicillin V Potassium 500 Mg Tabs (Penicillin v potassium) .... Take 1 tablet by mouth three times a day   Patient Instructions: 1)  You will be treated for sinusitis and bronchitis. Medication will be sent your pharmacy. 2)  Follow up as before.   Prescriptions: PENICILLIN V POTASSIUM 500 MG TABS (PENICILLIN V POTASSIUM) Take 1 tablet by mouth three times a day  #30 x 0   Entered and Authorized by:   Syliva Overman MD   Signed by:   Syliva Overman MD on 12/06/2007   Method used:   Electronically to        [facility]. [phone]* (retail)       [address]       [place], [place]  [address]       Ph: [phone] or [phone]       Fax: [phone]   RxID:   [id number]  ]

## 2010-04-21 NOTE — Progress Notes (Signed)
Summary: MORAYATI  MORAYATI   Imported By: Lind Guest 03/27/2008 15:18:37  _____________________________________________________________________  External Attachment:    Type:   Image     Comment:   External Document

## 2010-04-21 NOTE — Progress Notes (Signed)
Summary: acid reflux  Phone Note Call from Patient   Summary of Call: Patient called in and states she hasn't been feeling right over the weekend, said it was a little uncomfortable eating, like she had acid reflux.  Gave her an appointment for tomorrow afternoon, she states she doesn't think it is her heart but will call her heart doctor as well.  Looked for nurse, but were in a room.  Please advise. Initial call taken by: Curtis Sites,  January 19, 2010 9:53 AM  Follow-up for Phone Call        SPOKE WITH PATIENT AND SHE STATES SHE WILL BE FINE UNTIL TOMORROW, STATES SHE THINKS SHE HAS BAD ACID REFLUX Follow-up by: Adella Hare LPN,  January 19, 2010 10:08 AM
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
## 2010-04-21 NOTE — Assessment & Plan Note (Signed)
Summary: office visit   Vital Signs:  Patient profile:   65 year old female Menstrual status:  hysterectomy Height:      63 inches Weight:      204.50 pounds BMI:     36.36 O2 Sat:      96 % Pulse rate:   69 / minute Pulse rhythm:   regular Resp:     16 per minute BP sitting:   110 / 70  (right arm) Cuff size:   large  Vitals Entered By: Everitt Amber (April 17, 2009 8:13 AM)  Nutrition Counseling: Patient's BMI is greater than 25 and therefore counseled on weight management options. CC: having a headache in forehead, her left jaw was hurting yesterday and now her throat is dry feeling   CC:  having a headache in forehead and her left jaw was hurting yesterday and now her throat is dry feeling.  History of Present Illness: Reports  that she hs not been doing welll in the pst few days. Denies recent fever or chills.  Denies chest pain, palpitations, PND, orthopnea or leg swelling. Denies abdominal pain, nausea, vomitting, diarrhea or constipation. Denies change in bowel movements or bloody stool. Denies dysuria , frequency, incontinence or hesitancy. Denies  joint pain, swelling, or reduced mobility. Headahes are controlled with medication. Denies depression, anxiety or insomnia.She actually wants to taper offo of the paxil, states therapy has helped heralot. Denies  rash, lesions, or itch.     Allergies: 1)  ! Sulfa 2)  ! Nsaids 3)  ! Asa  Review of Systems      See HPI General:  Denies chills, fatigue, fever, loss of appetite, sleep disorder, and weakness. ENT:  Complains of decreased hearing, ear discharge, hoarseness, nasal congestion, postnasal drainage, and ringing in ears; periorbital pressure and frontal pressure x 1 week,dry throat and tender glands under left jaw x 2 days. Resp:  Complains of cough; denies sputum productive; primarily at night , no sputum. Neuro:  Complains of headaches; denies poor balance, seizures, sensation of room spinning, and  tingling; migraines controlled withimipramine. Endo:  Denies cold intolerance, excessive hunger, excessive thirst, excessive urination, heat intolerance, polyuria, and weight change; tests daily, fasting 125, post prandial 145 and 165.  Physical Exam  General:  Well-developed,well-nourished,in no acute distress; alert,appropriate and cooperative throughout examination HEENT: No facial asymmetry,  EOMI, positive sinus tenderness, TM's Clear, oropharynx  pink and moist.positive anterior cervical adenitis   Chest: Clear to auscultation bilaterally.  CVS: S1, S2, No murmurs, No S3.   Abd: Soft, Nontender.  MS: Adequate ROM spine, hips, shoulders and knees.  Ext: No edema.   CNS: CN 2-12 intact, power tone and sensation normal throughout.   Skin: Intact, no visible lesions or rashes.  Psych: Good eye contact, normal affect.  Memory intact, not anxious or depressed appearing.    Impression & Recommendations:  Problem # 1:  DEPRESSION (ICD-311) Assessment Improved  Her updated medication list for this problem includes:    Imipramine Hcl 25 Mg Tabs (Imipramine hcl) .Marland Kitchen... Take four tab by mouth at bedtime    Paroxetine Hcl 10 Mg Tabs (Paroxetine hcl) .Marland Kitchen... Take 1 tablet by mouth once a day  Problem # 2:  DIABETES MELLITUS, TYPE II, WITHOUT COMPLICATIONS (ICD-250.00) Assessment: Deteriorated  Her updated medication list for this problem includes:    Glipizide Xl 10 Mg Tb24 (Glipizide) .Marland Kitchen... Take one tablet by mouth twice a day    Metformin Hcl 1000 Mg Tabs (Metformin hcl) .Marland KitchenMarland KitchenMarland KitchenMarland Kitchen  Take 1 tablet by mouth two times a day    Onglyza 5 Mg Tabs (Saxagliptin hcl) ..... One tab by mouth qd    Quinapril Hcl 10 Mg Tabs (Quinapril hcl) .Marland Kitchen... Take 1 tablet by mouth once a day    Lantus Solostar 100 Unit/ml Soln (Insulin glargine) .Marland Kitchen... 20 units at bedtime  Labs Reviewed: Creat: 0.84 (12/19/2008)    Reviewed HgBA1c results: 8.7 (04/17/2009)  7.5 (01/09/2009)  Problem # 3:  ESSENTIAL HYPERTENSION,  BENIGN (ICD-401.1) Assessment: Improved  Her updated medication list for this problem includes:    Hydrochlorothiazide 25 Mg Tabs (Hydrochlorothiazide) .Marland Kitchen... Take one tablet by mouth once a day    Spironolactone 50 Mg Tabs (Spironolactone) .Marland Kitchen... Take one tablet by mouth once a day    Metoprolol Succinate 25 Mg Xr24h-tab (Metoprolol succinate) .Marland Kitchen... Take one tab by mouth two times a day    Verapamil Hcl Cr 180 Mg Cr-tabs (Verapamil hcl) ..... One cap by mouth qd    Quinapril Hcl 10 Mg Tabs (Quinapril hcl) .Marland Kitchen... Take 1 tablet by mouth once a day  Orders: T-Basic Metabolic Panel [id number])  BP today: 110/70 Prior BP: 102/66 (02/06/2009)  Labs Reviewed: K+: 4.3 (12/19/2008) Creat: : 0.84 (12/19/2008)   Chol: 148 (12/19/2008)   HDL: 41 (12/19/2008)   LDL: 83 (12/19/2008)   TG: 120 (12/19/2008)  Problem # 4:  HYPERLIPIDEMIA (ICD-272.4) Assessment: Comment Only  Her updated medication list for this problem includes:    Vytorin 10-40 Mg Tabs (Ezetimibe-simvastatin) .Marland Kitchen... Take one tablet by mouth once a day  Orders: T-Hepatic Function [id number]) T-Lipid Profile [id number])  Labs Reviewed: SGOT: 21 (12/19/2008)   SGPT: 24 (12/19/2008)   HDL:41 (12/19/2008), 36 (09/21/2006)  LDL:83 (12/19/2008), 40 (09/21/2006)  Chol:148 (12/19/2008), 97 (09/21/2006)  Trig:120 (12/19/2008), 104 (09/21/2006)  Problem # 5:  ACUTE SINUSITIS, UNSPECIFIED (ICD-461.9) Assessment: Comment Only  Her updated medication list for this problem includes:    Zithromax 250 Mg Tabs (Azithromycin) .Marland Kitchen... Take 2 tablets today, then one tablet daily for the next 4 days  Complete Medication List: 1)  Hydrochlorothiazide 25 Mg Tabs (Hydrochlorothiazide) .... Take one tablet by mouth once a day 2)  Vytorin 10-40 Mg Tabs (Ezetimibe-simvastatin) .... Take one tablet by mouth once a day 3)  Glipizide Xl 10 Mg Tb24 (Glipizide) .... Take one tablet by mouth twice a day 4)  Spironolactone 50 Mg Tabs (Spironolactone) ....  Take one tablet by mouth once a day 5)  Metoprolol Succinate 25 Mg Xr24h-tab (Metoprolol succinate) .... Take one tab by mouth two times a day 6)  Imipramine Hcl 25 Mg Tabs (Imipramine hcl) .... Take four tab by mouth at bedtime 7)  Metformin Hcl 1000 Mg Tabs (Metformin hcl) .... Take 1 tablet by mouth two times a day 8)  Onglyza 5 Mg Tabs (Saxagliptin hcl) .... One tab by mouth qd 9)  Synthroid 200 Mcg Tabs (Levothyroxine sodium) .... One tab by mouth qd 10)  Verapamil Hcl Cr 180 Mg Cr-tabs (Verapamil hcl) .... One cap by mouth qd 11)  Hydrocodone-acetaminophen 5-500 Mg Tabs (Hydrocodone-acetaminophen) .... One to two tabs by mouth every 4 to 6 hours as needed for pain 12)  Nitrostat 0.4 Mg Subl (Nitroglycerin) .... Uad 13)  Zithromax 250 Mg Tabs (Azithromycin) .... Take 2 tablets today, then one tablet daily for the next 4 days 14)  Paroxetine Hcl 10 Mg Tabs (Paroxetine hcl) .... Take 1 tablet by mouth once a day 15)  Quinapril Hcl 10 Mg Tabs (Quinapril hcl) .... Take  1 tablet by mouth once a day 16)  Prednisone 5 Mg Tabs (Prednisone) .... One tablet 3 times daily for 2 days, then one tablet 2 times daily for 2 days , then one tab daily for 3 days 17)  Lantus Solostar 100 Unit/ml Soln (Insulin glargine) .... 20 units at bedtime  Other Orders: Glucose, (CBG) (82962) Hemoglobin A1C (83036) T-CBC w/Diff ([id number])  Patient Instructions: 1)  Please schedule a follow-up appointment in 4 months. 2)  It is important that you exercise regularly at least 20 minutes 5 times a week. If you develop chest pain, have severe difficulty breathing, or feel very tired , stop exercising immediately and seek medical attention. 3)  You need to lose weight. Consider a lower calorie diet and regular exercise.  4)  BMP prior to visit, ICD-9: 5)  Hepatic Panel prior to visit, ICD-9:  fasting end March 6)  Lipid Panel prior to visit, ICD-9: 7)  CBC w/ Diff prior to visit, ICD-9: 8)  oK to taper off the paxil  as we discussed with the remaining 8 to 10 tablets. 9)  Use one decongestant daily for the next 5 days eg claritin D or mucinex Prescriptions: LANTUS SOLOSTAR 100 UNIT/ML SOLN (INSULIN GLARGINE) 20 units at bedtime  #600 units x 3   Entered and Authorized by:   Syliva Overman MD   Signed by:   Syliva Overman MD on 04/27/2009   Method used:   [facility] to        [facility] [phone]* (retail)       [address]       [place], [place]  [address]       Ph: [phone] or [phone]       Fax: [phone]   RxID:   [id number] ZITHROMAX Z-PAK 250 MG TABS (AZITHROMYCIN) Use as directed  #6 x 0   Entered and Authorized by:   Syliva Overman MD   Signed by:   Syliva Overman MD on 04/17/2009   Method used:   [facility] [address] [phone]* (retail)       [address]       [place], [place]  [address]       Ph: [phone] or [phone]       Fax: [phone]   RxID:   [id number]   Laboratory Results   Blood Tests   Date/Time Received: April 17, 2009  Date/Time Reported: April 17, 2009   Glucose (random): 187 mg/dL   (Normal Range: 83-151) HGBA1C: 8.7%   (Normal Range: Non-Diabetic - 3-6%   Control Diabetic - 6-8%)       Appended Document: office visit pls contact the pt, let her know that after reviewing her meds, I think she would bestbenefit from lantus , log acting insulin, since the onglyza is similar to victoz and it is unlikely that her ins would cover it. Also pls bring her in to explain how to use the lantus, it states 20 units at bedtime BUT she is to start at 8 units then titrate up every 3 days as needed, pLS let me knnow if she will be coming in and following through, thanks!  Appended Document: office visit called patient, no answer  Appended Document: office visit called patient, no answer, mailed letter  Appended Document: office visit patient states the onglyza was  covered  Appended Document: office visit if she is wanting the victoza in place of the onglyza , we can try that instead of the insulin, if she wants this erx victoza 0.6mg  subcutaneously x 1 week, then victoza 1.2 mg sucutaneouslyx 4 weeks refill 4 on the 1.2mg  dose. Let her know she absolutely needsto test regularly, the victoza may cut her apetitie but it is vital she eat regularly, and she may have nausea with the victoza she will need to discontinue it  Appended Document: office visit no, she is taking the onglyza, insurance paid for it  Appended Document: office visit pls call the pharmacy ask that they restock the victoza, , remove it from her med list also, I have advised her againstr taking it because of a low assocn with thyroid ca and she already has issues with this. i have already spoken with Ms Lemarr, i advise addition of lantu, she will d/w Dr Kerrie Pleasure  Appended Document: office visit noted

## 2010-04-21 NOTE — Letter (Signed)
Summary: EGD/ED ORDER  EGD/ED ORDER   Imported By: Ave Filter 01/27/2010 15:44:46  _____________________________________________________________________  External Attachment:    Type:   Image     Comment:   External Document

## 2010-04-21 NOTE — Miscellaneous (Signed)
Summary: refill  Clinical Lists Changes  Medications: Added new medication of ACCU-CHEK AVIVA  STRP (GLUCOSE BLOOD) once daily testing - Signed Rx of ACCU-CHEK AVIVA  STRP (GLUCOSE BLOOD) once daily testing;  #100 x 2;  Signed;  Entered by: Adella Hare LPN;  Authorized by: Syliva Overman MD;  Method used: Electronically to [facility] [phone]*, [address] [place], [place], [place]  [address], Ph: [phone] or [phone], Fax: [phone]    Prescriptions: ACCU-CHEK AVIVA  STRP (GLUCOSE BLOOD) once daily testing  #100 x 2   Entered by:   Adella Hare LPN   Authorized by:   Syliva Overman MD   Signed by:   Adella Hare LPN on 24/40/1027   Method used:   Electronically to        [facility]. [phone]* (retail)       [address]       [place], [place]  [address]       Ph: [phone] or [phone]       Fax: [phone]   RxID:   [id number]   Appended Document: refill patient came in today for insulin pen teaching

## 2010-04-21 NOTE — Progress Notes (Signed)
  Phone Note Call from Patient   Caller: Patient Summary of Call: patient states dr Kerrie Pleasure told her not to take victoza but for dr Kirsi Hugh to prescibe whatever she wants patient to be on instead Initial call taken by: Adella Hare LPN,  May 07, 2009 4:11 PM [facility]er: [name] [phone]*  Follow-up for Phone Call        pls advise pt she needs to start lantus 7 units at bedtime and inc evwery 3 days as neede for avg fasting over 130 . The dose to be sent in is 20 units at night, she starts at 7 units.   sHE is to come in for nurse to explain th lantus titration before the script is handed to her ( enter in EMR  after giving her)  pls alos give and explain the titration log  Follow-up by: Syliva Overman MD,  May 07, 2009 5:28 PM  Additional Follow-up for Phone Call Additional follow up Details #1::        called patient, left message Additional Follow-up by: Adella Hare LPN,  May 08, 2009 2:32 PM    Additional Follow-up for Phone Call Additional follow up Details #2::    patient coming in monday morning for insulin teaching Follow-up by: Adella Hare LPN,  May 08, 2009 4:49 PM

## 2010-04-21 NOTE — Consult Note (Signed)
Summary: morayati / office visit  morayati / office visit   Imported By: Lind Guest 12/22/2007 08:18:15  _____________________________________________________________________  External Attachment:    Type:   Image     Comment:   External Document

## 2010-04-21 NOTE — Progress Notes (Signed)
Summary: REF  Phone Note Call from Patient   Summary of Call: WILL NEED REF TO SEE TO DR. Venetia Maxon HAS NOT BEEN THERE SINCE 2006 AND THEY WOULD NOT LET HER SCH. A APPT Initial call taken by: Lind Guest,  February 28, 2009 11:30 AM  Follow-up for Phone Call        referred pt to [facility] Follow-up by: Rudene Anda,  February 28, 2009 1:49 PM

## 2010-04-21 NOTE — Progress Notes (Signed)
[facility]   Imported By: Lind Guest 08/13/2009 09:07:19  _____________________________________________________________________  External Attachment:    Type:   Image     Comment:   External Document

## 2010-04-21 NOTE — Progress Notes (Signed)
Summary: results from lab work  Phone Note Call from Patient   Summary of Call: is wanting her lab results call back at [phone] Initial call taken by: Lind Guest,  January 27, 2010 11:00 AM  Follow-up for Phone Call        pls let the pt know her blood sugars are still uncontrolled, give her her HBA1C and the goal,pls ask her to recall how much lantus she is actually taking now, and let you know her fasting blood sugars this past 5 to 7 days, pls document that info , and let her know that i will call her back later today once you fwd me the info , so we can discusss her med mx, and the implications of continued uncontrolled blood sugars Follow-up by: Syliva Overman MD,  January 28, 2010 5:31 AM  Additional Follow-up for Phone Call Additional follow up Details #1::        returned call, left message Additional Follow-up by: Adella Hare LPN,  January 28, 2010 2:16 PM    Additional Follow-up for Phone Call Additional follow up Details #2::    patient spoke with dr Thaila Bottoms yesterday Follow-up by: Adella Hare LPN,  January 30, 2010 4:07 PM

## 2010-04-21 NOTE — Letter (Signed)
Summary: DR. Patrecia Pace  DR. MORAYATI   Imported By: Lind Guest 07/19/2008 09:12:25  _____________________________________________________________________  External Attachment:    Type:   Image     Comment:   External Document

## 2010-04-21 NOTE — Assessment & Plan Note (Signed)
Summary: NWGN,FAOZHYQMV FOR ONE WEEK/SS   Visit Type:  Consult Referring Provider:  Syliva Overman Primary Care Provider:  Syliva Overman  Chief Complaint:  gerd and dysphagia.  History of Present Illness: Jennifer Blackburn is a pleasant 65 y/o AA female, patient of Dr. Lodema Hong, who presents for further evaluation of GERD, odynophagia, dysphagia.   She has prior h/o chronic GERD but has not noticed any significant problems with acid reflux in awhile. Had taken Nexium about three years ago. Sat/Sun had lots of pain in chest and pain with swallowing. Symptoms aggravated by meals. Still feels some epigastric pain. Just taking TUMS and Rolaids. Trying baking soda. Didn't take omeprazole given by Dr. Lodema Hong. Has problems swallowing large pills and wonders if pill got stuck. She does note some atypcial chest pain she has had off/on for several months, treated with steroids/NSAIDS for musculoskeletal pain/costchondritis. Some pp BM, for past one month. Sometimes loose. No melena, brbpr. No abd pain/cramps. Happens with certain foods eating out.    Current Medications (verified): 1)  Hydrochlorothiazide 25 Mg  Tabs (Hydrochlorothiazide) .... Take One Tablet By Mouth Once A Day 2)  Glipizide Xl 10 Mg  Tb24 (Glipizide) .... Take One Tablet By Mouth Twice A Day 3)  Metoprolol Succinate 25 Mg  Xr24h-Tab (Metoprolol Succinate) .... Take One Tab By Mouth Two Times A Day 4)  Imipramine Hcl 25 Mg  Tabs (Imipramine Hcl) .... Take Four Tab By Mouth At Bedtime 5)  Metformin Hcl 1000 Mg Tabs (Metformin Hcl) .... Take 1 Tablet By Mouth Two Times A Day 6)  Synthroid 200 Mcg Tabs (Levothyroxine Sodium) .... One Tab By Mouth Once Daily 7)  Verapamil Hcl Cr 180 Mg Cr-Tabs (Verapamil Hcl) .... One Cap By Mouth Once Daily 8)  Nitrostat 0.4 Mg Subl (Nitroglycerin) .... Uad 9)  Quinapril Hcl 10 Mg Tabs (Quinapril Hcl) .... Take 1 Tablet By Mouth Once A Day 10)  Bd Pen Needle Short U/f 31g X 8 Mm Misc (Insulin Pen Needle)  .... Once Daily Use 11)  Accu-Chek Aviva  Strp (Glucose Blood) .... Once Daily Testing 12)  Lantus Solostar 100 Unit/ml Soln (Insulin Glargine) .... 30 Units Once Daily 13)  Loratadine 10 Mg Tabs (Loratadine) .... Take 1 Tablet By Mouth Once A Day As Needed 14)  Pravastatin Sodium 80 Mg Tabs (Pravastatin Sodium) .... One Tab By Mouth At Bedtime Discontinue Lovastatin  Allergies (verified): 1)  ! Sulfa 2)  ! Nsaids 3)  ! Jonne Ply  Past History:  Past Medical History:   DEGENERATIVE JOINT DISEASE, LUMBAR SPINE (ICD-721.90) BACK PAIN (ICD-724.5) HEADACHE, CHRONIC (ICD-784.0) HYPERLIPIDEMIA (ICD-272.4) CAD (ICD-414.00) DIABETES MELLITUS, TYPE II, WITHOUT COMPLICATIONS (ICD-250.00) ESSENTIAL HYPERTENSION, BENIGN (ICD-401.1) CARPAL TUNNEL SYNDROME, BILATERAL (ICD-354.0) thyroid cancer Anxiety EGD/TCS, Dr. Jena Gauss, 2005-->normal EGD/left-sided diverticula  Past Surgical History: Hysterectomy Thyroidectomy, 2001 Knee surgery Tonsillectomy Neck surgery  Family History: Father- MI No FH of CRC, colon polyps, PUD, liver disease.  Social History: Retired Married Never Smoked. Chronic second-hand smoke exposure Alcohol use-no Drug use-no  Review of Systems General:  Denies fever, chills, sweats, anorexia, fatigue, and weight loss. Eyes:  Denies vision loss. ENT:  Complains of difficulty swallowing; denies nasal congestion, sore throat, and hoarseness. CV:  Complains of chest pains; denies angina, palpitations, dyspnea on exertion, and peripheral edema. Resp:  Denies dyspnea at rest, dyspnea with exercise, cough, sputum, and wheezing. GI:  See HPI. GU:  Denies urinary burning and blood in urine. MS:  Complains of low back pain. Derm:  Denies rash  and itching. Neuro:  Denies weakness, frequent headaches, memory loss, and confusion. Psych:  Complains of anxiety; denies depression, memory loss, and suicidal ideation. Endo:  Denies unusual weight change. Heme:  Denies bruising and  bleeding. Allergy:  Denies hives and rash.  Vital Signs:  Patient profile:   65 year old female Menstrual status:  hysterectomy Height:      63 inches Weight:      201 pounds BMI:     35.73 Temp:     98.0 degrees F oral Pulse rate:   80 / minute BP sitting:   110 / 70  (left arm) Cuff size:   regular  Vitals Entered By: Hendricks Limes LPN (January 23, 2010 10:46 AM)  Physical Exam  General:  Well developed, well nourished, no acute distress.obese.   Head:  Normocephalic and atraumatic. Eyes:  sclera nonicteric Mouth:  Oropharyngeal mucosa moist, pink.  No lesions, erythema or exudate.    Neck:  Supple; no masses or thyromegaly. Lungs:  Clear throughout to auscultation. Heart:  Regular rate and rhythm; no murmurs, rubs,  or bruits. Abdomen:  Bowel sounds normal.  Abdomen is soft, nontender, nondistended.  No rebound or guarding.  No hepatosplenomegaly, masses or hernias.  No abdominal bruits.  Extremities:  No clubbing, cyanosis, edema or deformities noted. Neurologic:  Alert and  oriented x4;  grossly normal neurologically. Skin:  Intact without significant lesions or rashes. Cervical Nodes:  No significant cervical adenopathy. Psych:  Alert and cooperative. Normal mood and affect.  Impression & Recommendations:  Problem # 1:  OTHER DYSPHAGIA (ICD-787.29)  Recent odynophagia, acute onset. Chronic dysphagia to pills. Chronic GERD and atypical chest pain (sounds musculoskeletal with intermittent treatment with steroids/NSAIDS with good relief). ?recent pill-induced esophagitis? Patient reluctant to take Paxil and Prilosec together. I did not find specific drug interaction. She tapered herself off Paxil. Recommend begin Prilosec. EGD+/-ED to be performed in near future.  Risks, alternatives, benefits including but not limited to risk of reaction to medications, bleeding, infection, and perforation addressed.  Patient voiced understanding and verbal consent obtained.    Orders: Consultation Level III [phone])  Problem # 2:  SCREENING COLORECTAL-CANCER (ICD-V76.51)  Due 10/2013.   Orders: Consultation Level III (62130) I would like to thank Dr. Syliva Overman for allowing [place] to take part in the care of this nice patient.   Appended Document: GERD,DYSPHAGIA FOR ONE WEEK/SS Recent H.Pylori serologies were negative.

## 2010-04-21 NOTE — Letter (Signed)
Summary: APPT FOR A REFERRAL  APPT FOR A REFERRAL   Imported By: Lind Guest 03/12/2009 09:20:50  _____________________________________________________________________  External Attachment:    Type:   Image     Comment:   External Document

## 2010-04-21 NOTE — Letter (Signed)
Summary: Letter  Letter   Imported By: Lind Guest 05/01/2009 13:38:16  _____________________________________________________________________  External Attachment:    Type:   Image     Comment:   External Document

## 2010-04-21 NOTE — Progress Notes (Signed)
Summary: STRESS TEST  STRESS TEST   Imported By: Lind Guest 08/06/2009 10:00:31  _____________________________________________________________________  External Attachment:    Type:   Image     Comment:   External Document

## 2010-04-21 NOTE — Assessment & Plan Note (Signed)
Summary: OV   Vital Signs:  Patient profile:   65 year old female Menstrual status:  hysterectomy Height:      63 inches Weight:      201.25 pounds BMI:     35.78 O2 Sat:      97 % Pulse rate:   73 / minute Pulse rhythm:   regular Resp:     16 per minute BP sitting:   120 / 76  (left arm) Cuff size:   large  Vitals Entered By: Everitt Amber LPN (December 12, 2009 8:15 AM)  Nutrition Counseling: Patient's BMI is greater than 25 and therefore counseled on weight management options. CC: has been congested and not feeling good. Started out with sneezing and eyes itching and now her nasal passages are stopped up and she is coughing, alot of clear drainage Comments didn't bring meds   CC:  has been congested and not feeling good. Started out with sneezing and eyes itching and now her nasal passages are stopped up and she is coughing and alot of clear drainage.  History of Present Illness: 4 day h/o head congestion, now with post nasal drainage and tickle and cough in the past 2 days.Swe has had no fever or chills. She has benexperiencing allergy symptonms of excessive sneezing , nasal congestion and post nasal drip.She has no green drainage from thenose andno green sputum. She reports fasting blood sugars to be between130 to 140.   Allergies: 1)  ! Sulfa 2)  ! Nsaids 3)  ! Asa  Review of Systems      See HPI General:  Complains of fatigue and sleep disorder; poor sleep due to excessive cough. Eyes:  Denies blurring and discharge. ENT:  Complains of nasal congestion, postnasal drainage, and sinus pressure. CV:  Denies chest pain or discomfort, palpitations, and swelling of feet. Resp:  Complains of cough; denies sputum productive. GI:  Denies abdominal pain, constipation, diarrhea, nausea, and vomiting. GU:  Denies dysuria and urinary frequency. MS:  Complains of joint pain and stiffness. Derm:  Denies lesion(s) and rash. Neuro:  Denies headaches. Psych:  Denies anxiety and  depression. Endo:  Denies excessive thirst and excessive urination. Heme:  Denies abnormal bruising and bleeding. Allergy:  Complains of itching eyes, seasonal allergies, and sneezing.  Physical Exam  General:  Well-developed,well-nourished,in no acute distress; alert,appropriate and cooperative throughout examination HEENT: No facial asymmetry,  EOMI, No sinus tenderness, TM's Clear, oropharynx  pink and moist. erythem and edma of nasal mucosa.  Chest: Clear to auscultation bilaterally.  CVS: S1, S2, No murmurs, No S3.   Abd: Soft, Nontender.  MS: Adequate ROM spine, hips, shoulders and knees.  Ext: No edema.   CNS: CN 2-12 intact, power tone and sensation normal throughout.   Skin: Intact, no visible lesions or rashes.  Psych: Good eye contact, normal affect.  Memory intact, not anxious or depressed appearing.    Impression & Recommendations:  Problem # 1:  ALLERGIC RHINITIS CAUSE UNSPECIFIED (ICD-477.9) Assessment Deteriorated  Her updated medication list for this problem includes:    Loratadine 10 Mg Tabs (Loratadine) .Marland Kitchen... Take 1 tablet by mouth once a day  Problem # 2:  HYPERLIPIDEMIA (ICD-272.4) Assessment: Comment Only  The following medications were removed from the medication list:    Vytorin 10-40 Mg Tabs (Ezetimibe-simvastatin) .Marland Kitchen... Take one tablet by mouth once a day Her updated medication list for this problem includes:    Lovastatin 40 Mg Tabs (Lovastatin) .Marland Kitchen... 2 tablets at bedtime Low fat  dietdiscussed and encouraged  Labs Reviewed: SGOT: 21 (12/19/2008)   SGPT: 24 (12/19/2008)   HDL:41 (12/19/2008), 36 (09/21/2006)  LDL:83 (12/19/2008), 40 (09/21/2006)  Chol:148 (12/19/2008), 97 (09/21/2006)  Trig:120 (12/19/2008), 104 (09/21/2006)  Problem # 3:  DIABETES MELLITUS, TYPE II, WITHOUT COMPLICATIONS (ICD-250.00) Assessment: Comment Only  Her updated medication list for this problem includes:    Glipizide Xl 10 Mg Tb24 (Glipizide) .Marland Kitchen... Take one tablet by  mouth twice a day    Metformin Hcl 1000 Mg Tabs (Metformin hcl) .Marland Kitchen... Take 1 tablet by mouth two times a day    Onglyza 5 Mg Tabs (Saxagliptin hcl) ..... One tab by mouth qd    Quinapril Hcl 10 Mg Tabs (Quinapril hcl) .Marland Kitchen... Take 1 tablet by mouth once a day    Lantus Solostar 100 Unit/ml Soln (Insulin glargine) .Marland KitchenMarland KitchenMarland KitchenMarland Kitchen 50 units once daily Patient advised to reduce carbs and sweets, commit to regular physical activity, take meds as prescribed, test blood sugars as directed, and attempt to lose weight , to improve blood sugar control.  Orders: T-Urine Microalbumin w/creat. ratio [phone])  Labs Reviewed: Creat: 0.68 (09/23/2009)    Reviewed HgBA1c results: 8.5 (09/23/2009)  8.7 (04/17/2009)  Problem # 4:  ESSENTIAL HYPERTENSION, BENIGN (ICD-401.1) Assessment: Unchanged  Her updated medication list for this problem includes:    Hydrochlorothiazide 25 Mg Tabs (Hydrochlorothiazide) .Marland Kitchen... Take one tablet by mouth once a day    Metoprolol Succinate 25 Mg Xr24h-tab (Metoprolol succinate) .Marland Kitchen... Take one tab by mouth two times a day    Verapamil Hcl Cr 180 Mg Cr-tabs (Verapamil hcl) ..... One cap by mouth qd    Quinapril Hcl 10 Mg Tabs (Quinapril hcl) .Marland Kitchen... Take 1 tablet by mouth once a day  BP today: 120/76 Prior BP: 120/70 (09/23/2009)  Labs Reviewed: K+: 4.0 (09/23/2009) Creat: : 0.68 (09/23/2009)   Chol: 148 (12/19/2008)   HDL: 41 (12/19/2008)   LDL: 83 (12/19/2008)   TG: 120 (12/19/2008)  Complete Medication List: 1)  Hydrochlorothiazide 25 Mg Tabs (Hydrochlorothiazide) .... Take one tablet by mouth once a day 2)  Glipizide Xl 10 Mg Tb24 (Glipizide) .... Take one tablet by mouth twice a day 3)  Metoprolol Succinate 25 Mg Xr24h-tab (Metoprolol succinate) .... Take one tab by mouth two times a day 4)  Imipramine Hcl 25 Mg Tabs (Imipramine hcl) .... Take four tab by mouth at bedtime 5)  Metformin Hcl 1000 Mg Tabs (Metformin hcl) .... Take 1 tablet by mouth two times a day 6)   Onglyza 5 Mg Tabs (Saxagliptin hcl) .... One tab by mouth qd 7)  Synthroid 200 Mcg Tabs (Levothyroxine sodium) .... One tab by mouth qd 8)  Verapamil Hcl Cr 180 Mg Cr-tabs (Verapamil hcl) .... One cap by mouth qd 9)  Nitrostat 0.4 Mg Subl (Nitroglycerin) .... Uad 10)  Paroxetine Hcl 10 Mg Tabs (Paroxetine hcl) .... Take 1 tablet by mouth once a day 11)  Quinapril Hcl 10 Mg Tabs (Quinapril hcl) .... Take 1 tablet by mouth once a day 12)  Bd Pen Needle Short U/f 31g X 8 Mm Misc (Insulin pen needle) .... Once daily use 13)  Accu-chek Aviva Strp (Glucose blood) .... Once daily testing 14)  Lantus Solostar 100 Unit/ml Soln (Insulin glargine) .... 50 units once daily 15)  Penicillin V Potassium 250 Mg Tabs (Penicillin v potassium) .... Take 1 tablet by mouth two times a day 16)  Tessalon Perles 100 Mg Caps (Benzonatate) .... Take 1 tablet by mouth two times a day 17)  Loratadine 10 Mg Tabs (Loratadine) .... Take 1 tablet by mouth once a day 18)  Lovastatin 40 Mg Tabs (Lovastatin) .... 2 tablets at bedtime  Patient Instructions: 1)  f/u as before. 2)  you are being treated for uncontrolled allergies. 3)  I 4)  you will get a script to start generic loratidine (claritin0 one daily, pls also get oTC sudafed take ay most 1 twice dasily for the next 3 to5 days, this will cut down the drainage whih is keeping you aake. 5)  you will get a script to hold for penicillin use in the next 3 to 5 days iF you get fever, chills, green drainage or sputum. 6)  Drink alot of fluids and get plenty of rest. 7)  I suggest you inc the lantus to 35 units if your avg fASTINGS are 130 or more. 8)  I hopre you feel better soon 9)  pls stop vytorin due to drug interactions, and use lovastatin in its place Prescriptions: LOVASTATIN 40 MG TABS (LOVASTATIN) 2 tablets at bedtime  #60 x 3   Entered and Authorized by:   Syliva Overman MD   Signed by:   Syliva Overman MD on 12/12/2009   Method used:   Printed then faxed to  ...       [facility]  [address] [phone]* (retail)       [address]       [place], [place]  [address]       Ph: [phone] or [phone]       Fax: [phone]   RxID:   [id number] LORATADINE 10 MG TABS (LORATADINE) Take 1 tablet by mouth once a day  #90 x 0   Entered by:   Everitt Amber LPN   Authorized by:   Syliva Overman MD   Signed by:   Syliva Overman MD on 12/12/2009   Method used:   Handwritten   RxID:   [id number] TESSALON PERLES 100 MG CAPS (BENZONATATE) Take 1 tablet by mouth two times a day  #14 x 0   Entered by:   Everitt Amber LPN   Authorized by:   Syliva Overman MD   Signed by:   Syliva Overman MD on 12/12/2009   Method used:   Handwritten   RxID:   [id number] PENICILLIN V POTASSIUM 250 MG TABS (PENICILLIN V POTASSIUM) Take 1 tablet by mouth two times a day  #28 x 0   Entered by:   Everitt Amber LPN   Authorized by:   Syliva Overman MD   Signed by:   Syliva Overman MD on 12/12/2009   Method used:   Historical   RxID:   [id number]   Appended Document: OV pls call pt and explain she cannot use vytorin with her cardizem, and needs to change to lovastatin, i have printed the script pls write d/c vytorin and fax to her pgharmacy of choice  Appended Document: OV called patient left message  Appended Document: OV Patient aware

## 2010-04-21 NOTE — Progress Notes (Signed)
[facility]   Imported By: Lind Guest 04/11/2009 11:03:40  _____________________________________________________________________  External Attachment:    Type:   Image     Comment:   External Document

## 2010-04-21 NOTE — Progress Notes (Signed)
[facility]   Imported By: Lind Guest 06/17/2009 13:18:05  _____________________________________________________________________  External Attachment:    Type:   Image     Comment:   External Document

## 2010-04-21 NOTE — Progress Notes (Signed)
Summary: MEDICINE  Phone Note Call from Patient   Summary of Call: WOULD LIKE FOR YOU TO RENEW THIS MEDICINE PAROXETINE   [facility] [place]  Initial call taken by: Lind Guest,  February 26, 2009 4:15 PM  Follow-up for Phone Call        Rx Called In Follow-up by: Worthy Keeler LPN,  February 26, 2009 4:43 PM    Prescriptions: PAROXETINE HCL 10 MG TABS (PAROXETINE HCL) Take 1 tablet by mouth once a day  #30 x 4   Entered by:   Worthy Keeler LPN   Authorized by:   Syliva Overman MD   Signed by:   Worthy Keeler LPN on 09/81/1914   Method used:   [facility]        [facility]  [address] [phone]* (retail)       [address]       [place], [place]  [address]       Ph: [phone] or [phone]       Fax: [phone]   RxID:   [id number]

## 2010-04-21 NOTE — Miscellaneous (Signed)
Summary: pre load meds  Clinical Lists Changes  Medications: Added new medication of SPIRONOLACTONE 50 MG  TABS (SPIRONOLACTONE) take one tablet by mouth once a day Added new medication of METOPROLOL SUCCINATE 25 MG  XR24H-TAB (METOPROLOL SUCCINATE) take one tab by mouth two times a day Added new medication of IMIPRAMINE HCL 25 MG  TABS (IMIPRAMINE HCL) take four tab by mouth at bedtime Removed medication of SPIRONOLACTONE 50 MG  TABS (SPIRONOLACTONE) Take one tablet by mouth twice a day Removed medication of TOPROL XL 25 MG  TB24 (METOPROLOL SUCCINATE) Take one tablet by mouth once a day Removed medication of IMIPRAMINE HCL 25 MG  TABS (IMIPRAMINE HCL) Take one tablet by mouth once a ay

## 2010-04-21 NOTE — Letter (Signed)
[facility]   Imported By: Lind Guest 11/20/2009 16:17:47  _____________________________________________________________________  External Attachment:    Type:   Image     Comment:   External Document

## 2010-04-21 NOTE — Progress Notes (Signed)
Summary: RESULTS  Phone Note Call from Patient   Summary of Call: Terrilee Croak BACK AT [phone]  ABOUT  TEST RESULTS  LEFT MESSAGE Initial call taken by: Lind Guest,  January 28, 2010 5:00 PM  Follow-up for Phone Call        pls refer pt fora hIDA scan, see referral box Follow-up by: Syliva Overman MD,  January 29, 2010 5:55 PM  Additional Follow-up for Phone Call Additional follow up Details #1::        Patient has an appt. for HIDA scan on Nov. 15,  Additional Follow-up by: Curtis Sites,  January 30, 2010 10:56 AM

## 2010-04-21 NOTE — Assessment & Plan Note (Signed)
Summary: office visit   Vital Signs:  Patient profile:   65 year old female Menstrual status:  hysterectomy Height:      63 inches Weight:      200.50 pounds BMI:     35.65 O2 Sat:      97 % on Room air Pulse rate:   89 / minute Pulse rhythm:   regular Resp:     16 per minute BP sitting:   120 / 70  (left arm)  Vitals Entered By: Adella Hare LPN (September 23, 100 9:01 AM)  Nutrition Counseling: Patient's BMI is greater than 25 and therefore counseled on weight management options.  O2 Flow:  Room air CC: follow-up visit Is Patient Diabetic? Yes Did you bring your meter with you today? No Pain Assessment Patient in pain? no      Comments did not bring meds to ov   CC:  follow-up visit.  History of Present Illness: Reports  that she has been  doing fairly well. Denies recent fever or chills. Denies sinus pressure, nasal congestion , ear pain or sore throat. Denies chest congestion, or cough productive of sputum. Denies chest pain, palpitations, PND, orthopnea or leg swelling. recent cardiology eval was negatoive for active CAD Denies abdominal pain, nausea, vomitting, diarrhea or constipation. Denies change in bowel movements or bloody stool. Denies dysuria , frequency, incontinence or hesitancy. uncontrolled left hip and low back pain, awaiting ortho eval, neurosurg feels the hip is the big problem Denies headaches, vertigo, seizures.Headaches are controlled on meds. Denies depression, anxiety or insomnia.Pt benefitted tremendously from psychotherapy. Denies  rash, lesions, or itch.     Allergies (verified): 1)  ! Sulfa 2)  ! Nsaids 3)  ! Asa  Review of Systems      See HPI General:  Complains of fatigue; denies chills, fever, and sweats. Eyes:  Denies blurring, discharge, eye pain, and red eye. MS:  Complains of joint pain, low back pain, and stiffness. Endo:  Denies cold intolerance, excessive hunger, excessive thirst, excessive urination, heat intolerance,  polyuria, and weight change. Heme:  Denies abnormal bruising and bleeding. Allergy:  Complains of seasonal allergies.  Physical Exam  General:  Well-developed,well-nourished,in no acute distress; alert,appropriate and cooperative throughout examination HEENT: No facial asymmetry,  EOMI, No sinus tenderness, TM's Clear, oropharynx  pink and moist.   Chest: Clear to auscultation bilaterally.  CVS: S1, S2, No murmurs, No S3.   Abd: Soft, Nontender.  MS: Adequate ROM spine, hips, shoulders and knees.  Ext: No edema.   CNS: CN 2-12 intact, power tone and sensation normal throughout.   Skin: Intact, no visible lesions or rashes.  Psych: Good eye contact, normal affect.  Memory intact, not anxious or depressed appearing.   Diabetes Management Exam:    Foot Exam (with socks and/or shoes not present):       Sensory-Monofilament:          Left foot: normal          Right foot: normal       Inspection:          Left foot: normal          Right foot: normal       Nails:          Left foot: normal          Right foot: normal   Impression & Recommendations:  Problem # 1:  DEPRESSION (ICD-311) Assessment Improved  Her updated medication list for this  problem includes:    Imipramine Hcl 25 Mg Tabs (Imipramine hcl) .Marland Kitchen... Take four tab by mouth at bedtime    Paroxetine Hcl 10 Mg Tabs (Paroxetine hcl) .Marland Kitchen... Take 1 tablet by mouth once a day  Problem # 2:  OBESITY, UNSPECIFIED (ICD-278.00) Assessment: Unchanged  Ht: 63 (09/23/2009)   Wt: 200.50 (09/23/2009)   BMI: 35.65 (09/23/2009)  Problem # 3:  DIABETES MELLITUS, TYPE II, WITHOUT COMPLICATIONS (ICD-250.00) Assessment: Comment Only  The following medications were removed from the medication list:    Lantus Solostar 100 Unit/ml Soln (Insulin glargine) .Marland Kitchen... 20 units at bedtime    Lantus Solostar 100 Unit/ml Soln (Insulin glargine) .Marland KitchenMarland KitchenMarland KitchenMarland Kitchen 35 units every night at bedtime Her updated medication list for this problem includes:     Glipizide Xl 10 Mg Tb24 (Glipizide) .Marland Kitchen... Take one tablet by mouth twice a day    Metformin Hcl 1000 Mg Tabs (Metformin hcl) .Marland Kitchen... Take 1 tablet by mouth two times a day    Onglyza 5 Mg Tabs (Saxagliptin hcl) ..... One tab by mouth qd    Quinapril Hcl 10 Mg Tabs (Quinapril hcl) .Marland Kitchen... Take 1 tablet by mouth once a day    Lantus Solostar 100 Unit/ml Soln (Insulin glargine) .Marland KitchenMarland KitchenMarland KitchenMarland Kitchen 50 units once daily  Orders: T- Hemoglobin A1C ([id number]) T-Urine Microalbumin w/creat. ratio [phone])  Labs Reviewed: Creat: 0.84 (12/19/2008)    Reviewed HgBA1c results: 8.7 (04/17/2009)  7.5 (01/09/2009)  Problem # 4:  ESSENTIAL HYPERTENSION, BENIGN (ICD-401.1) Assessment: Unchanged  The following medications were removed from the medication list:    Spironolactone 50 Mg Tabs (Spironolactone) .Marland Kitchen... Take one tablet by mouth once a day Her updated medication list for this problem includes:    Hydrochlorothiazide 25 Mg Tabs (Hydrochlorothiazide) .Marland Kitchen... Take one tablet by mouth once a day    Metoprolol Succinate 25 Mg Xr24h-tab (Metoprolol succinate) .Marland Kitchen... Take one tab by mouth two times a day    Verapamil Hcl Cr 180 Mg Cr-tabs (Verapamil hcl) ..... One cap by mouth qd    Quinapril Hcl 10 Mg Tabs (Quinapril hcl) .Marland Kitchen... Take 1 tablet by mouth once a day  Orders: T-Basic Metabolic Panel [id number]) T-Basic Metabolic Panel [id number] [phone])  BP today: 120/70 Prior BP: 110/60 (07/18/2009)  Labs Reviewed: K+: 4.3 (12/19/2008) Creat: : 0.84 (12/19/2008)   Chol: 148 (12/19/2008)   HDL: 41 (12/19/2008)   LDL: 83 (12/19/2008)   TG: 120 (12/19/2008)  Problem # 5:  HYPERLIPIDEMIA (ICD-272.4) Assessment: Comment Only  Her updated medication list for this problem includes:    Vytorin 10-40 Mg Tabs (Ezetimibe-simvastatin) .Marland Kitchen... Take one tablet by mouth once a day  Orders: T-Hepatic Function [id number]) T-Lipid Profile [id number])  Labs Reviewed: SGOT: 21 (12/19/2008)   SGPT: 24 (12/19/2008)    HDL:41 (12/19/2008), 36 (09/21/2006)  LDL:83 (12/19/2008), 40 (09/21/2006)  Chol:148 (12/19/2008), 97 (09/21/2006)  Trig:120 (12/19/2008), 104 (09/21/2006)  Complete Medication List: 1)  Hydrochlorothiazide 25 Mg Tabs (Hydrochlorothiazide) .... Take one tablet by mouth once a day 2)  Vytorin 10-40 Mg Tabs (Ezetimibe-simvastatin) .... Take one tablet by mouth once a day 3)  Glipizide Xl 10 Mg Tb24 (Glipizide) .... Take one tablet by mouth twice a day 4)  Metoprolol Succinate 25 Mg Xr24h-tab (Metoprolol succinate) .... Take one tab by mouth two times a day 5)  Imipramine Hcl 25 Mg Tabs (Imipramine hcl) .... Take four tab by mouth at bedtime 6)  Metformin Hcl 1000 Mg Tabs (Metformin hcl) .... Take 1 tablet by mouth two times a day 7)  Onglyza 5  Mg Tabs (Saxagliptin hcl) .... One tab by mouth qd 8)  Synthroid 200 Mcg Tabs (Levothyroxine sodium) .... One tab by mouth qd 9)  Verapamil Hcl Cr 180 Mg Cr-tabs (Verapamil hcl) .... One cap by mouth qd 10)  Nitrostat 0.4 Mg Subl (Nitroglycerin) .... Uad 11)  Paroxetine Hcl 10 Mg Tabs (Paroxetine hcl) .... Take 1 tablet by mouth once a day 12)  Quinapril Hcl 10 Mg Tabs (Quinapril hcl) .... Take 1 tablet by mouth once a day 13)  Bd Pen Needle Short U/f 31g X 8 Mm Misc (Insulin pen needle) .... Once daily use 14)  Accu-chek Aviva Strp (Glucose blood) .... Once daily testing 15)  Lantus Solostar 100 Unit/ml Soln (Insulin glargine) .... 50 units once daily  Other Orders: T-CBC w/Diff ([id number])  Patient Instructions: 1)  Please schedule a follow-up appointment in 3 months. 2)  It is important that you exercise regularly at least 20 minutes 5 times a week. If you develop chest pain, have severe difficulty breathing, or feel very tired , stop exercising immediately and seek medical attention. 3)  You need to lose weight. Consider a lower calorie diet and regular exercise.  4)  HbgA1C prior to visit, ICD-9: and chem 7 today. and CBC 5)  Fasting lipid and  hepatic panel and chem 7 in 3 months also microalb. 6)  See your eye doctor yearly to check for diabetic eye damage. 7)  Check your blood sugars regularly. If your readings are usually above : or below 70 you should contact our office.  Prescriptions: LANTUS SOLOSTAR 100 UNIT/ML SOLN (INSULIN GLARGINE) 50 units once daily  #1500 units x 3   Entered and Authorized by:   Syliva Overman MD   Signed by:   Syliva Overman MD on 09/23/2009   Method used:   Printed then faxed to ...       [facility]  [address] [phone]* (retail)       [address]       [place], [place]  [address]       Ph: [phone] or [phone]       Fax: [phone]   RxID:   [id number]

## 2010-04-21 NOTE — Assessment & Plan Note (Signed)
Summary: office visit   Vital Signs:  Patient profile:   65 year old female Menstrual status:  hysterectomy Height:      63 inches Weight:      202 pounds BMI:     35.91 O2 Sat:      97 % Pulse rate:   91 / minute Pulse rhythm:   regular Resp:     16 per minute BP sitting:   114 / 74  (left arm) Cuff size:   large  Vitals Entered By: Everitt Amber LPN (June 19, 2009 8:55 AM)  Nutrition Counseling: Patient's BMI is greater than 25 and therefore counseled on weight management options. CC: Follow up chronic problems Is Patient Diabetic? Yes   CC:  Follow up chronic problems.  History of Present Illness: Reports  that she has been doing fairly well. Denies recent fever or chills.  Denies chest congestion, or cough productive of sputum. Denies chest pain, palpitations, PND, orthopnea or leg swelling. Denies abdominal pain, nausea, vomitting, diarrhea or constipation. Denies change in bowel movements or bloody stool. Denies dysuria , frequency, incontinence or hesitancy.  Deniesvertigo, seizures.Reports occasional headaches, ad goosd response to her meds. Denies depression, anxiety or insomnia.Improved and controlled on meds Denies  rash, lesions, or itch.      Current Medications (verified): 1)  Hydrochlorothiazide 25 Mg  Tabs (Hydrochlorothiazide) .... Take One Tablet By Mouth Once A Day 2)  Vytorin 10-40 Mg  Tabs (Ezetimibe-Simvastatin) .... Take One Tablet By Mouth Once A Day 3)  Glipizide Xl 10 Mg  Tb24 (Glipizide) .... Take One Tablet By Mouth Twice A Day 4)  Spironolactone 50 Mg  Tabs (Spironolactone) .... Take One Tablet By Mouth Once A Day 5)  Metoprolol Succinate 25 Mg  Xr24h-Tab (Metoprolol Succinate) .... Take One Tab By Mouth Two Times A Day 6)  Imipramine Hcl 25 Mg  Tabs (Imipramine Hcl) .... Take Four Tab By Mouth At Bedtime 7)  Metformin Hcl 1000 Mg Tabs (Metformin Hcl) .... Take 1 Tablet By Mouth Two Times A Day 8)  Onglyza 5 Mg Tabs (Saxagliptin Hcl) ....  One Tab By Mouth Qd 9)  Synthroid 200 Mcg Tabs (Levothyroxine Sodium) .... One Tab By Mouth Qd 10)  Verapamil Hcl Cr 180 Mg Cr-Tabs (Verapamil Hcl) .... One Cap By Mouth Qd 11)  Hydrocodone-Acetaminophen 5-500 Mg Tabs (Hydrocodone-Acetaminophen) .... One To Two Tabs By Mouth Every 4 To 6 Hours As Needed For Pain 12)  Nitrostat 0.4 Mg Subl (Nitroglycerin) .... Uad 13)  Zithromax 250 Mg Tabs (Azithromycin) .... Take 2 Tablets Today, Then One Tablet Daily For The Next 4 Days 14)  Paroxetine Hcl 10 Mg Tabs (Paroxetine Hcl) .... Take 1 Tablet By Mouth Once A Day 15)  Quinapril Hcl 10 Mg Tabs (Quinapril Hcl) .... Take 1 Tablet By Mouth Once A Day 16)  Prednisone 5 Mg Tabs (Prednisone) .... One Tablet 3 Times Daily For 2 Days, Then One Tablet 2 Times Daily For 2 Days , Then One Tab Daily For 3 Days 17)  Lantus Solostar 100 Unit/ml Soln (Insulin Glargine) .... 20 Units At Bedtime 18)  Bd Pen Needle Short U/f 31g X 8 Mm Misc (Insulin Pen Needle) .... Once Daily Use 19)  Accu-Chek Aviva  Strp (Glucose Blood) .... Once Daily Testing  Allergies (verified): 1)  ! Sulfa 2)  ! Nsaids 3)  ! Asa  Review of Systems      See HPI Eyes:  Denies blurring, discharge, eye pain, and red eye. ENT:  Complains of postnasal drainage and sinus pressure; sinuses had c leared fully but in past 2 weeks she has gone back to green nastuy drainage. MS:  Complains of joint pain, low back pain, mid back pain, and stiffness; had injection in left hip in jan 20111 with great relief, but still has klow back pain. Endo:  Denies cold intolerance, excessive hunger, excessive thirst, excessive urination, heat intolerance, polyuria, and weight change; tests twice daily fastings avg 1130 to 140, current lantus 20 units. Heme:  Denies abnormal bruising and bleeding. Allergy:  Complains of seasonal allergies.  Physical Exam  General:  Well-developed,well-nourished,in no acute distress; alert,appropriate and cooperative throughout  examination HEENT: No facial asymmetry,  EOMI, positive sinus tenderness, TM's Clear, oropharynx  pink and moist.   Chest: Clear to auscultation bilaterally.  CVS: S1, S2, No murmurs, No S3.   Abd: Soft, Nontender.  MS: Adequate ROM spine, hips, shoulders and knees.  Ext: No edema.   CNS: CN 2-12 intact, power tone and sensation normal throughout.   Skin: Intact, no visible lesions or rashes.  Psych: Good eye contact, normal affect.  Memory intact, not anxious or depressed appearing.    Impression & Recommendations:  Problem # 1:  ACUTE SINUSITIS, UNSPECIFIED (ICD-461.9) Assessment Comment Only  Her updated medication list for this problem includes:    Zithromax 250 Mg Tabs (Azithromycin) .Marland Kitchen... Take 2 tablets today, then one tablet daily for the next 4 days    Zithromax Z-pak 250 Mg Tabs (Azithromycin) ..... Use as directed  Problem # 2:  DEPRESSION (ICD-311) Assessment: Improved  Her updated medication list for this problem includes:    Imipramine Hcl 25 Mg Tabs (Imipramine hcl) .Marland Kitchen... Take four tab by mouth at bedtime    Paroxetine Hcl 10 Mg Tabs (Paroxetine hcl) .Marland Kitchen... Take 1 tablet by mouth once a day  Problem # 3:  ESSENTIAL HYPERTENSION, BENIGN (ICD-401.1) Assessment: Unchanged  Her updated medication list for this problem includes:    Hydrochlorothiazide 25 Mg Tabs (Hydrochlorothiazide) .Marland Kitchen... Take one tablet by mouth once a day    Spironolactone 50 Mg Tabs (Spironolactone) .Marland Kitchen... Take one tablet by mouth once a day    Metoprolol Succinate 25 Mg Xr24h-tab (Metoprolol succinate) .Marland Kitchen... Take one tab by mouth two times a day    Verapamil Hcl Cr 180 Mg Cr-tabs (Verapamil hcl) ..... One cap by mouth qd    Quinapril Hcl 10 Mg Tabs (Quinapril hcl) .Marland Kitchen... Take 1 tablet by mouth once a day  BP today: 114/74 Prior BP: 110/70 (04/17/2009)  Labs Reviewed: K+: 4.3 (12/19/2008) Creat: : 0.84 (12/19/2008)   Chol: 148 (12/19/2008)   HDL: 41 (12/19/2008)   LDL: 83 (12/19/2008)   TG: 120  (12/19/2008)  Problem # 4:  HYPERLIPIDEMIA (ICD-272.4) Assessment: Comment Only  Her updated medication list for this problem includes:    Vytorin 10-40 Mg Tabs (Ezetimibe-simvastatin) .Marland Kitchen... Take one tablet by mouth once a day  Labs Reviewed: SGOT: 21 (12/19/2008)   SGPT: 24 (12/19/2008)   HDL:41 (12/19/2008), 36 (09/21/2006)  LDL:83 (12/19/2008), 40 (09/21/2006)  Chol:148 (12/19/2008), 97 (09/21/2006)  Trig:120 (12/19/2008), 104 (09/21/2006), recently checked by card reportedly  Problem # 5:  DIABETES MELLITUS, TYPE II, WITHOUT COMPLICATIONS (ICD-250.00)  The following medications were removed from the medication list:    Lantus Solostar 100 Unit/ml Soln (Insulin glargine) ..... Inject 20 units subcutaneously at bedtime Her updated medication list for this problem includes:    Glipizide Xl 10 Mg Tb24 (Glipizide) .Marland Kitchen... Take one tablet by mouth  twice a day    Metformin Hcl 1000 Mg Tabs (Metformin hcl) .Marland Kitchen... Take 1 tablet by mouth two times a day    Onglyza 5 Mg Tabs (Saxagliptin hcl) ..... One tab by mouth qd    Quinapril Hcl 10 Mg Tabs (Quinapril hcl) .Marland Kitchen... Take 1 tablet by mouth once a day    Lantus Solostar 100 Unit/ml Soln (Insulin glargine) .Marland Kitchen... 20 units at bedtime    Lantus Solostar 100 Unit/ml Soln (Insulin glargine) .Marland KitchenMarland KitchenMarland KitchenMarland Kitchen 35 units every night at bedtime  Orders: Glucose, (CBG) (82962) T- Hemoglobin A1C ([address]-91478)  Problem # 6:  DEGENERATIVE JOINT DISEASE, LUMBAR SPINE (ICD-721.90) Assessment: Unchanged  Complete Medication List: 1)  Hydrochlorothiazide 25 Mg Tabs (Hydrochlorothiazide) .... Take one tablet by mouth once a day 2)  Vytorin 10-40 Mg Tabs (Ezetimibe-simvastatin) .... Take one tablet by mouth once a day 3)  Glipizide Xl 10 Mg Tb24 (Glipizide) .... Take one tablet by mouth twice a day 4)  Spironolactone 50 Mg Tabs (Spironolactone) .... Take one tablet by mouth once a day 5)  Metoprolol Succinate 25 Mg Xr24h-tab (Metoprolol succinate) .... Take one tab by mouth  two times a day 6)  Imipramine Hcl 25 Mg Tabs (Imipramine hcl) .... Take four tab by mouth at bedtime 7)  Metformin Hcl 1000 Mg Tabs (Metformin hcl) .... Take 1 tablet by mouth two times a day 8)  Onglyza 5 Mg Tabs (Saxagliptin hcl) .... One tab by mouth qd 9)  Synthroid 200 Mcg Tabs (Levothyroxine sodium) .... One tab by mouth qd 10)  Verapamil Hcl Cr 180 Mg Cr-tabs (Verapamil hcl) .... One cap by mouth qd 11)  Hydrocodone-acetaminophen 5-500 Mg Tabs (Hydrocodone-acetaminophen) .... One to two tabs by mouth every 4 to 6 hours as needed for pain 12)  Nitrostat 0.4 Mg Subl (Nitroglycerin) .... Uad 13)  Zithromax 250 Mg Tabs (Azithromycin) .... Take 2 tablets today, then one tablet daily for the next 4 days 14)  Paroxetine Hcl 10 Mg Tabs (Paroxetine hcl) .... Take 1 tablet by mouth once a day 15)  Quinapril Hcl 10 Mg Tabs (Quinapril hcl) .... Take 1 tablet by mouth once a day 16)  Prednisone 5 Mg Tabs (Prednisone) .... One tablet 3 times daily for 2 days, then one tablet 2 times daily for 2 days , then one tab daily for 3 days 17)  Lantus Solostar 100 Unit/ml Soln (Insulin glargine) .... 20 units at bedtime 18)  Bd Pen Needle Short U/f 31g X 8 Mm Misc (Insulin pen needle) .... Once daily use 19)  Accu-chek Aviva Strp (Glucose blood) .... Once daily testing 20)  Zithromax Z-pak 250 Mg Tabs (Azithromycin) .... Use as directed 21)  Lantus Solostar 100 Unit/ml Soln (Insulin glargine) .... 35 units every night at bedtime  Patient Instructions: 1)  Please schedule a follow-up appointment in early July 2)  HBa1C April 27 or after. 3)  Med is sent ion for sinus infection. 4)  Increase to lantus 23 units tonight and then by 3 units everty 3 days if needed. 5)  It is important that you exercise regularly at least 20 minutes 5 times a week. If you develop chest pain, have severe difficulty breathing, or feel very tired , stop exercising immediately and seek medical attention. 6)  You need to lose weight.  Consider a lower calorie diet and regular exercise.  Prescriptions: LANTUS SOLOSTAR 100 UNIT/ML SOLN (INSULIN GLARGINE) 35 units every night at bedtime  #1050 units x 3   Entered and Authorized  by:   Syliva Overman MD   Signed by:   Syliva Overman MD on 06/19/2009   Method used:   Printed then faxed to ...       [facility]  [address] [phone]* (retail)       [address]       [place], [place]  [address]       Ph: [phone] or [phone]       Fax: [phone]   RxID:   [id number] Christena Deem Z-PAK 250 MG TABS (AZITHROMYCIN) Use as directed  #6 x 0   Entered and Authorized by:   Syliva Overman MD   Signed by:   Syliva Overman MD on 06/19/2009   Method used:   [facility] to        [facility] [phone]* (retail)       [address]       [place], [place]  [address]       Ph: [phone] or [phone]       Fax: [phone]   RxID:   [id number]   Laboratory Results   Blood Tests     Glucose (fasting): 155 mg/dL   (Normal Range: 37-628)

## 2010-04-22 ENCOUNTER — Ambulatory Visit: Payer: Medicare Other | Admitting: Family Medicine

## 2010-04-22 ENCOUNTER — Ambulatory Visit: Admit: 2010-04-22 | Payer: Self-pay | Admitting: Family Medicine

## 2010-04-23 NOTE — Letter (Signed)
Summary: MEDICAL CLEARANCE FOR [facility]  MEDICAL CLEARANCE FOR [facility]   Imported By: Lind Guest 04/07/2010 13:28:11  _____________________________________________________________________  External Attachment:    Type:   Image     Comment:   External Document

## 2010-04-27 ENCOUNTER — Encounter: Payer: Self-pay | Admitting: Family Medicine

## 2010-04-29 NOTE — Assessment & Plan Note (Signed)
Summary: diaherra   Vital Signs:  Patient profile:   65 year old female Menstrual status:  hysterectomy Height:      63 inches Weight:      204 pounds BMI:     36.27 O2 Sat:      95 % Pulse rate:   83 / minute Pulse rhythm:   regular Resp:     16 per minute BP sitting:   118 / 72  (left arm)  Vitals Entered By: Everitt Amber LPN (April 08, 2010 10:07 AM)  Nutrition Counseling: Patient's BMI is greater than 25 and therefore counseled on weight management options. CC: Follow up chronic problems, has been having diarrhea for months. Whenever she eats she has to go to the bathroom. Head congestion and sore throat, sneezing, sinus aching and pressure   Primary Care Provider:  Syliva Overman  CC:  Follow up chronic problems, has been having diarrhea for months. Whenever she eats she has to go to the bathroom. Head congestion and sore throat, sneezing, and sinus aching and pressure.  History of Present Illness: Pt reports she has not been well in the past 7 to 10 dyas with upper respiratory symptoms, generalised aches and weakness.  She also reports uncontrolled diarreah,esp when she eats out, and worsening anxiety and depression, she intends to return to therapy.  Current Medications (verified): 1)  Hydrochlorothiazide 25 Mg  Tabs (Hydrochlorothiazide) .... Take One Tablet By Mouth Once A Day 2)  Glipizide Xl 10 Mg  Tb24 (Glipizide) .... Take One Tablet By Mouth Twice A Day 3)  Metoprolol Succinate 25 Mg  Xr24h-Tab (Metoprolol Succinate) .... Take One Tab By Mouth Two Times A Day 4)  Imipramine Hcl 25 Mg  Tabs (Imipramine Hcl) .... Take Four Tab By Mouth At Bedtime 5)  Metformin Hcl 1000 Mg Tabs (Metformin Hcl) .... Take 1 Tablet By Mouth Two Times A Day 6)  Synthroid 200 Mcg Tabs (Levothyroxine Sodium) .... One Tab By Mouth Once Daily 7)  Verapamil Hcl Cr 180 Mg Cr-Tabs (Verapamil Hcl) .... One Cap By Mouth Once Daily 8)  Nitrostat 0.4 Mg Subl (Nitroglycerin) .... Uad 9)   Quinapril Hcl 10 Mg Tabs (Quinapril Hcl) .... Take 1 Tablet By Mouth Once A Day 10)  Bd Pen Needle Short U/f 31g X 8 Mm Misc (Insulin Pen Needle) .... Once Daily Use 11)  Accu-Chek Aviva  Strp (Glucose Blood) .... Once Daily Testing 12)  Lantus Solostar 100 Unit/ml Soln (Insulin Glargine) .... 30 Units Once Daily 13)  Pravastatin Sodium 80 Mg Tabs (Pravastatin Sodium) .... One Tab By Mouth At Bedtime Discontinue Lovastatin  Allergies (verified): 1)  ! Sulfa 2)  ! Nsaids 3)  ! Asa  Review of Systems      See HPI General:  Complains of chills, fatigue, fever, loss of appetite, malaise, sleep disorder, and weakness. Eyes:  Denies discharge and red eye. ENT:  Complains of nasal congestion, postnasal drainage, sinus pressure, and sore throat; 1 week h/o increased and uncontrolled symptoms. CV:  Denies chest pain or discomfort, palpitations, and swelling of feet. Resp:  Denies cough and sputum productive. GI:  Complains of diarrhea; chronic diarreah for approx 4 months esp when she eats out, BM's range from 1 to 3 per day, has 2 to 3  diarreah days per weekl. GU:  Denies dysuria and urinary frequency. MS:  Complains of joint pain and stiffness; has upcoming hip surgery. Derm:  Denies itching and rash. Neuro:  Complains of headaches; denies poor  balance, seizures, and sensation of room spinning. Psych:  Complains of anxiety, depression, and mental problems; denies suicidal thoughts/plans, thoughts of violence, and unusual visions or sounds; increased and uncontrolled symptoms, thinking ofreturning to therapy and requests meds. Endo:  Denies excessive thirst and excessive urination; reports improved blood sugars and tests on avg twice daily. Heme:  Denies abnormal bruising, bleeding, enlarge lymph nodes, and fevers. Allergy:  Complains of seasonal allergies.  Physical Exam  General:  Well-developed,well-nourished,in no acute distress; alert,appropriate and cooperative throughout examination.  Ill appearing HEENT: No facial asymmetry,  EOMI,maxillary  sinus tenderness, TM's Clear, oropharynx  pink and moist. anterior cervical adenitis  Chest: Clear to auscultation bilaterally.  CVS: S1, S2, No murmurs, No S3.   Abd: Soft, generalised tenderness, no guarding or rebound, hyperactivebowel sounds  MS: Adequate ROM spine,decreased in  hips,adequate in  shoulders and knees.  Ext: No edema.   CNS: CN 2-12 intact, power tone and sensation normal throughout.   Skin: Intact, no visible lesions or rashes.  Psych: Good eye contact, normal affect.  Memory intact,  anxious but not  depressed appearing.    Impression & Recommendations:  Problem # 1:  ACUTE SINUSITIS, UNSPECIFIED (ICD-461.9) Assessment Comment Only  Her updated medication list for this problem includes:    Doxycycline Hyclate 100 Mg Caps (Doxycycline hyclate) .Marland Kitchen... Take 1 capsule by mouth two times a day    Tessalon Perles 100 Mg Caps (Benzonatate) .Marland Kitchen... Take 1 capsule by mouth three times a day  Problem # 2:  ESSENTIAL HYPERTENSION, BENIGN (ICD-401.1) Assessment: Unchanged  Her updated medication list for this problem includes:    Hydrochlorothiazide 25 Mg Tabs (Hydrochlorothiazide) .Marland Kitchen... Take one tablet by mouth once a day    Metoprolol Succinate 25 Mg Xr24h-tab (Metoprolol succinate) .Marland Kitchen... Take one tab by mouth two times a day    Verapamil Hcl Cr 180 Mg Cr-tabs (Verapamil hcl) ..... One cap by mouth once daily    Quinapril Hcl 10 Mg Tabs (Quinapril hcl) .Marland Kitchen... Take 1 tablet by mouth once a day  BP today: 118/72 Prior BP: 110/70 (01/23/2010)  Labs Reviewed: K+: 3.9 (01/20/2010) Creat: : 0.81 (01/20/2010)   Chol: 148 (12/19/2008)   HDL: 41 (12/19/2008)   LDL: 83 (12/19/2008)   TG: 120 (12/19/2008)  Problem # 3:  OBESITY, UNSPECIFIED (ICD-278.00) Assessment: Unchanged  Ht: 63 (04/08/2010)   Wt: 204 (04/08/2010)   BMI: 36.27 (04/08/2010)  Problem # 4:  HYPERLIPIDEMIA (ICD-272.4) Assessment: Comment Only  Her  updated medication list for this problem includes:    Pravastatin Sodium 80 Mg Tabs (Pravastatin sodium) ..... One tab by mouth at bedtime discontinue lovastatin Low fat dietdiscussed and encouraged  Labs Reviewed: SGOT: 21 (12/19/2008)   SGPT: 24 (12/19/2008)   HDL:41 (12/19/2008), 36 (09/21/2006)  LDL:83 (12/19/2008), 40 (09/21/2006)  Chol:148 (12/19/2008), 97 (09/21/2006)  Trig:120 (12/19/2008), 104 (09/21/2006)  Problem # 5:  DIARRHEA (ICD-787.91) Assessment: Deteriorated  Discussed symptom control and diet. Call if worsening of symptoms or signs of dehydration.   Problem # 6:  DEPRESSION (ICD-311) Assessment: Deteriorated  Her updated medication list for this problem includes:    Imipramine Hcl 25 Mg Tabs (Imipramine hcl) .Marland Kitchen... Take four tab by mouth at bedtime    Paroxetine Hcl 10 Mg Tabs (Paroxetine hcl) .Marland Kitchen... Take 1 tablet by mouth once a day  Orders: Medicare Electronic Prescription [phone])  Complete Medication List: 1)  Hydrochlorothiazide 25 Mg Tabs (Hydrochlorothiazide) .... Take one tablet by mouth once a day 2)  Glipizide Xl 10  Mg Tb24 (Glipizide) .... Take one tablet by mouth twice a day 3)  Metoprolol Succinate 25 Mg Xr24h-tab (Metoprolol succinate) .... Take one tab by mouth two times a day 4)  Imipramine Hcl 25 Mg Tabs (Imipramine hcl) .... Take four tab by mouth at bedtime 5)  Metformin Hcl 1000 Mg Tabs (Metformin hcl) .... Take 1 tablet by mouth two times a day 6)  Synthroid 200 Mcg Tabs (Levothyroxine sodium) .... One tab by mouth once daily 7)  Verapamil Hcl Cr 180 Mg Cr-tabs (Verapamil hcl) .... One cap by mouth once daily 8)  Nitrostat 0.4 Mg Subl (Nitroglycerin) .... Uad 9)  Quinapril Hcl 10 Mg Tabs (Quinapril hcl) .... Take 1 tablet by mouth once a day 10)  Bd Pen Needle Short U/f 31g X 8 Mm Misc (Insulin pen needle) .... Once daily use 11)  Accu-chek Aviva Strp (Glucose blood) .... Once daily testing 12)  Lantus Solostar 100 Unit/ml Soln (Insulin  glargine) .... 30 units once daily 13)  Pravastatin Sodium 80 Mg Tabs (Pravastatin sodium) .... One tab by mouth at bedtime discontinue lovastatin 14)  Doxycycline Hyclate 100 Mg Caps (Doxycycline hyclate) .... Take 1 capsule by mouth two times a day 15)  Tessalon Perles 100 Mg Caps (Benzonatate) .... Take 1 capsule by mouth three times a day 16)  Paroxetine Hcl 10 Mg Tabs (Paroxetine hcl) .... Take 1 tablet by mouth once a day  Patient Instructions: 1)  F/U as before. 2)  You are being treated for sinusitis. 3)  Try to monitor more closely what you eat when you eat out in terms of fatty foods and also sweet foods. I believe thatanxiety around the issue is making it worse. 4)  Try immodium just before going out to eat. 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 6)  I will send a msg to Dr Lynnea Ferrier and [facility] for assistance with med clearance Prescriptions: PAROXETINE HCL 10 MG TABS (PAROXETINE HCL) Take 1 tablet by mouth once a day  #30 x 3   Entered and Authorized by:   Syliva Overman MD   Signed by:   Syliva Overman MD on 04/08/2010   Method used:   [facility] to        [facility] [phone]* (retail)       [address]       [place], [place]  [address]       Ph: [phone]       Fax: [phone]   RxID:   [id number] TESSALON PERLES 100 MG CAPS (BENZONATATE) Take 1 capsule by mouth three times a day  #30 x 0   Entered and Authorized by:   Syliva Overman MD   Signed by:   Syliva Overman MD on 04/08/2010   Method used:   Electronically to        [facility] [phone]* (retail)       [address]       [place], [place]  [address]       Ph: [phone]       Fax: [phone]   RxID:   [id number] DOXYCYCLINE HYCLATE 100 MG CAPS (DOXYCYCLINE HYCLATE) Take 1 capsule by mouth two times a day  #20 x 0   Entered and Authorized by:   Syliva Overman MD   Signed by:   Syliva Overman MD on 04/08/2010  Method used:   [facility]ronically to        [facility] [phone]* (retail)       [address]       [place], [place]  [address]       Ph: [phone]       Fax: [phone]   RxID:   [id number]    Orders Added: 1)  Est. Patient Level IV [244[address]] 2)  Medicare Electronic Prescription [phone]

## 2010-04-29 NOTE — Letter (Signed)
Summary: TO DR. SOLOMON  TO DR. SOLOMON   Imported By: Lind Guest 04/20/2010 10:47:52  _____________________________________________________________________  External Attachment:    Type:   Image     Comment:   External Document

## 2010-04-29 NOTE — Letter (Signed)
Summary: dr.moryati  dr.moryati   Imported By: Lind Guest 04/20/2010 11:27:54  _____________________________________________________________________  External Attachment:    Type:   Image     Comment:   External Document

## 2010-05-07 NOTE — Letter (Signed)
Summary: to dr. Lynnea Ferrier  to dr. Lynnea Ferrier   Imported By: Lind Guest 04/27/2010 13:49:36  _____________________________________________________________________  External Attachment:    Type:   Image     Comment:   External Document

## 2010-05-22 ENCOUNTER — Encounter: Payer: Self-pay | Admitting: Family Medicine

## 2010-06-02 NOTE — Letter (Signed)
[facility]   Imported By: Lind Guest 05/26/2010 11:39:08  _____________________________________________________________________  External Attachment:    Type:   Image     Comment:   External Document

## 2010-06-18 ENCOUNTER — Encounter: Payer: Self-pay | Admitting: Family Medicine

## 2010-06-18 ENCOUNTER — Telehealth: Payer: Self-pay | Admitting: Family Medicine

## 2010-06-18 NOTE — Telephone Encounter (Signed)
noted 

## 2010-06-23 ENCOUNTER — Other Ambulatory Visit (HOSPITAL_COMMUNITY): Payer: Self-pay | Admitting: Orthopedic Surgery

## 2010-06-23 ENCOUNTER — Other Ambulatory Visit: Payer: Self-pay | Admitting: Orthopedic Surgery

## 2010-06-23 ENCOUNTER — Ambulatory Visit (HOSPITAL_COMMUNITY)
Admission: RE | Admit: 2010-06-23 | Discharge: 2010-06-23 | Disposition: A | Discharge status: Home / Self Care | Payer: Medicare Other | Source: Ambulatory Visit | Attending: Orthopedic Surgery | Admitting: Orthopedic Surgery

## 2010-06-23 ENCOUNTER — Encounter (HOSPITAL_COMMUNITY): Payer: Medicare Other

## 2010-06-23 DIAGNOSIS — I10 Essential (primary) hypertension: Secondary | ICD-10-CM

## 2010-06-23 DIAGNOSIS — R0989 Other specified symptoms and signs involving the circulatory and respiratory systems: Secondary | ICD-10-CM | POA: Insufficient documentation

## 2010-06-23 DIAGNOSIS — M1990 Unspecified osteoarthritis, unspecified site: Secondary | ICD-10-CM

## 2010-06-23 DIAGNOSIS — Z01810 Encounter for preprocedural cardiovascular examination: Secondary | ICD-10-CM | POA: Insufficient documentation

## 2010-06-23 DIAGNOSIS — Z01818 Encounter for other preprocedural examination: Secondary | ICD-10-CM | POA: Insufficient documentation

## 2010-06-23 DIAGNOSIS — M5380 Other specified dorsopathies, site unspecified: Secondary | ICD-10-CM | POA: Insufficient documentation

## 2010-06-23 DIAGNOSIS — Z01812 Encounter for preprocedural laboratory examination: Secondary | ICD-10-CM | POA: Insufficient documentation

## 2010-06-23 LAB — CBC
HCT: 38.6 % (ref 36.0–46.0)
Hemoglobin: 12 g/dL (ref 12.0–15.0)
MCH: 26.8 pg (ref 26.0–34.0)
MCHC: 31.1 g/dL (ref 30.0–36.0)
MCV: 86.2 fL (ref 78.0–100.0)
Platelets: 294 10*3/uL (ref 150–400)
RBC: 4.48 MIL/uL (ref 3.87–5.11)
RDW: 13.5 % (ref 11.5–15.5)
WBC: 7.1 10*3/uL (ref 4.0–10.5)

## 2010-06-23 LAB — URINALYSIS, ROUTINE W REFLEX MICROSCOPIC
Bilirubin Urine: NEGATIVE
Glucose, UA: NEGATIVE mg/dL
Hgb urine dipstick: NEGATIVE
Ketones, ur: NEGATIVE mg/dL
Nitrite: NEGATIVE
Protein, ur: NEGATIVE mg/dL
Specific Gravity, Urine: 1.023 (ref 1.005–1.030)
Urobilinogen, UA: 0.2 mg/dL (ref 0.0–1.0)
pH: 5.5 (ref 5.0–8.0)

## 2010-06-23 LAB — COMPREHENSIVE METABOLIC PANEL
ALT: 23 U/L (ref 0–35)
AST: 25 U/L (ref 0–37)
Albumin: 4 g/dL (ref 3.5–5.2)
Alkaline Phosphatase: 41 U/L (ref 39–117)
BUN: 12 mg/dL (ref 6–23)
CO2: 29 mEq/L (ref 19–32)
Calcium: 8.8 mg/dL (ref 8.4–10.5)
Chloride: 99 mEq/L (ref 96–112)
Creatinine, Ser: 0.79 mg/dL (ref 0.4–1.2)
GFR calc Af Amer: >60 mL/min (ref >60)
GFR calc non Af Amer: >60 mL/min (ref >60)
Glucose, Bld: 150 mg/dL — ABNORMAL HIGH (ref 70–99)
Potassium: 3.4 mEq/L — ABNORMAL LOW (ref 3.5–5.1)
Sodium: 137 mEq/L (ref 135–145)
Total Bilirubin: 0.5 mg/dL (ref 0.3–1.2)
Total Protein: 8.1 g/dL (ref 6.0–8.3)

## 2010-06-23 LAB — PROTIME-INR
INR: 1.05 (ref 0.00–1.49)
Prothrombin Time: 13.9 seconds (ref 11.6–15.2)

## 2010-06-23 LAB — APTT: aPTT: 28 seconds (ref 24–37)

## 2010-06-23 LAB — SURGICAL PCR SCREEN
MRSA, PCR: NEGATIVE
Staphylococcus aureus: NEGATIVE

## 2010-06-26 ENCOUNTER — Other Ambulatory Visit: Payer: Self-pay | Admitting: Family Medicine

## 2010-07-01 ENCOUNTER — Inpatient Hospital Stay (HOSPITAL_COMMUNITY): Payer: Medicare Other

## 2010-07-01 ENCOUNTER — Inpatient Hospital Stay (HOSPITAL_COMMUNITY)
Admission: RE | Admit: 2010-07-01 | Discharge: 2010-07-06 | DRG: 470 | Disposition: A | Discharge status: Skilled Nursing Facility | Payer: Medicare Other | Source: Ambulatory Visit | Attending: Orthopedic Surgery | Admitting: Orthopedic Surgery

## 2010-07-01 DIAGNOSIS — D62 Acute posthemorrhagic anemia: Secondary | ICD-10-CM | POA: Diagnosis not present

## 2010-07-01 DIAGNOSIS — E119 Type 2 diabetes mellitus without complications: Secondary | ICD-10-CM | POA: Diagnosis present

## 2010-07-01 DIAGNOSIS — I10 Essential (primary) hypertension: Secondary | ICD-10-CM | POA: Diagnosis present

## 2010-07-01 DIAGNOSIS — Z01812 Encounter for preprocedural laboratory examination: Secondary | ICD-10-CM

## 2010-07-01 DIAGNOSIS — Z85850 Personal history of malignant neoplasm of thyroid: Secondary | ICD-10-CM

## 2010-07-01 DIAGNOSIS — M169 Osteoarthritis of hip, unspecified: Principal | ICD-10-CM | POA: Diagnosis present

## 2010-07-01 DIAGNOSIS — M1610 Unilateral primary osteoarthritis, unspecified hip: Principal | ICD-10-CM | POA: Diagnosis present

## 2010-07-01 DIAGNOSIS — R42 Dizziness and giddiness: Secondary | ICD-10-CM | POA: Diagnosis not present

## 2010-07-01 DIAGNOSIS — E890 Postprocedural hypothyroidism: Secondary | ICD-10-CM | POA: Diagnosis present

## 2010-07-01 DIAGNOSIS — E7800 Pure hypercholesterolemia, unspecified: Secondary | ICD-10-CM | POA: Diagnosis present

## 2010-07-01 HISTORY — PX: JOINT REPLACEMENT: SHX530

## 2010-07-01 HISTORY — PX: OTHER SURGICAL HISTORY: SHX169

## 2010-07-01 LAB — GLUCOSE, CAPILLARY
Glucose-Capillary: 159 mg/dL — ABNORMAL HIGH (ref 70–99)
Glucose-Capillary: 146 mg/dL — ABNORMAL HIGH (ref 70–99)
Glucose-Capillary: 162 mg/dL — ABNORMAL HIGH (ref 70–99)
Glucose-Capillary: 128 mg/dL — ABNORMAL HIGH (ref 70–99)
Glucose-Capillary: 145 mg/dL — ABNORMAL HIGH (ref 70–99)

## 2010-07-01 LAB — ABO/RH: ABO/RH(D): A POS

## 2010-07-02 LAB — GLUCOSE, CAPILLARY
Glucose-Capillary: 148 mg/dL — ABNORMAL HIGH (ref 70–99)
Glucose-Capillary: 164 mg/dL — ABNORMAL HIGH (ref 70–99)
Glucose-Capillary: 133 mg/dL — ABNORMAL HIGH (ref 70–99)
Glucose-Capillary: 165 mg/dL — ABNORMAL HIGH (ref 70–99)

## 2010-07-02 LAB — CBC
HCT: 30 % — ABNORMAL LOW (ref 36.0–46.0)
Hemoglobin: 9.4 g/dL — ABNORMAL LOW (ref 12.0–15.0)
MCH: 26.8 pg (ref 26.0–34.0)
MCHC: 31.3 g/dL (ref 30.0–36.0)
MCV: 85.5 fL (ref 78.0–100.0)
Platelets: 200 10*3/uL (ref 150–400)
RBC: 3.51 MIL/uL — ABNORMAL LOW (ref 3.87–5.11)
RDW: 13.6 % (ref 11.5–15.5)
WBC: 8.7 10*3/uL (ref 4.0–10.5)

## 2010-07-02 LAB — BASIC METABOLIC PANEL
BUN: 7 mg/dL (ref 6–23)
CO2: 30 mEq/L (ref 19–32)
Calcium: 7.8 mg/dL — ABNORMAL LOW (ref 8.4–10.5)
Chloride: 98 mEq/L (ref 96–112)
Creatinine, Ser: 0.78 mg/dL (ref 0.4–1.2)
GFR calc Af Amer: >60 mL/min (ref >60)
GFR calc non Af Amer: >60 mL/min (ref >60)
Glucose, Bld: 129 mg/dL — ABNORMAL HIGH (ref 70–99)
Potassium: 4.1 mEq/L (ref 3.5–5.1)
Sodium: 136 mEq/L (ref 135–145)

## 2010-07-03 LAB — GLUCOSE, CAPILLARY
Glucose-Capillary: 133 mg/dL — ABNORMAL HIGH (ref 70–99)
Glucose-Capillary: 193 mg/dL — ABNORMAL HIGH (ref 70–99)
Glucose-Capillary: 178 mg/dL — ABNORMAL HIGH (ref 70–99)
Glucose-Capillary: 246 mg/dL — ABNORMAL HIGH (ref 70–99)

## 2010-07-03 LAB — CBC
HCT: 28.4 % — ABNORMAL LOW (ref 36.0–46.0)
Hemoglobin: 8.8 g/dL — ABNORMAL LOW (ref 12.0–15.0)
MCH: 26.5 pg (ref 26.0–34.0)
MCHC: 31 g/dL (ref 30.0–36.0)
MCV: 85.5 fL (ref 78.0–100.0)
Platelets: 204 10*3/uL (ref 150–400)
RBC: 3.32 MIL/uL — ABNORMAL LOW (ref 3.87–5.11)
RDW: 13.6 % (ref 11.5–15.5)
WBC: 11 10*3/uL — ABNORMAL HIGH (ref 4.0–10.5)

## 2010-07-03 LAB — BASIC METABOLIC PANEL
BUN: 8 mg/dL (ref 6–23)
CO2: 30 mEq/L (ref 19–32)
Calcium: 7.8 mg/dL — ABNORMAL LOW (ref 8.4–10.5)
Chloride: 99 mEq/L (ref 96–112)
Creatinine, Ser: 0.8 mg/dL (ref 0.4–1.2)
GFR calc Af Amer: >60 mL/min (ref >60)
GFR calc non Af Amer: >60 mL/min (ref >60)
Glucose, Bld: 127 mg/dL — ABNORMAL HIGH (ref 70–99)
Potassium: 3.5 mEq/L (ref 3.5–5.1)
Sodium: 136 mEq/L (ref 135–145)

## 2010-07-03 NOTE — Op Note (Signed)
[name], GIEBLER NO.:  [id number]  MEDICAL RECORD NO.:  [id number]           PATIENT TYPE:  I  LOCATION:  0002                         FACILITY:  [facility]  PHYSICIAN:  Ollen Gross, M.D.    DATE OF BIRTH:  03-15-1946  DATE OF PROCEDURE:  07/01/2010 DATE OF DISCHARGE:                              OPERATIVE REPORT   PREOPERATIVE DIAGNOSIS:  Osteoarthritis, left hip.  POSTOPERATIVE DIAGNOSIS:  Osteoarthritis, left hip.  PROCEDURE:  Left total hip arthroplasty.  SURGEON:  Ollen Gross, M.D.  ASSISTANT:  Alexzandrew L. Perkins, P.A.C.  ANESTHESIA:  General.  ESTIMATED BLOOD LOSS:  250.Marland Kitchen  DRAINS:  Hemovac x1.  COMPLICATIONS:  None.  CONDITION:  Stable to recovery.  CLINICAL NOTE:  Jennifer Blackburn is a 65 year old female with advanced end- stage arthritis of the left hip with progressively worsening pain and dysfunction.  She has had several intra-articular injections which have no longer been helpful.  She presents now for total hip arthroplasty.  PROCEDURE IN DETAIL:  After successful administration of general anesthetic, the patient was placed in the right lateral decubitus position with the left side up and held with the hip positioner.  The left lower extremity was isolated from the perineum with plastic drapes and prepped and draped in the usual sterile fashion.  Short posterolateral incision was made with a #10 blade through the subcutaneous tissue to the fascia lata which was incised in line with the skin incision.  The sciatic nerve was palpated and protected and short rotators and capsule isolated off the femur.  Hip was dislocated and the center of the femoral head was marked.  The trial prosthesis was placed such that the center of the trial head corresponded to the center of the native femoral head.  Osteotomy line was marked on the femoral neck and osteotomy made with an oscillating saw.  The retraction was placed around the proximal femur  for exposure to the femoral canal.  The starter reamer was passed through the femoral canal and the canal thoroughly irrigated to remove fatty contents.  Axial reaming was then performed up to 13.5 mm, proximal reaming to an 18D and the sleeve machine to a small.  18D small trial sleeve was placed.  Femur was retracted anteriorly to gain acetabular exposure.  Acetabular retractors were placed and labrum and osteophytes removed.  She had a large rim of osteophytes superior and anterior.  Reaming starts at 43 coursing increments of 2 to 49 and a 50-mm pinnacle acetabular shell was placed in anatomic position and transfixed with 2 dome screws with excellent purchase.  Apex hole eliminator was placed and then the 32 mm neutral plus 4 marathon liner was placed.  The trial stem was placed which was an 18 x 13 with 36 plus 8 neck matching native anteversion.  A 32 plus 0 head was placed.  The hip was reduced with excellent stability.  Full extension, full external rotation with 70 degrees flexion, 4 degrees adduction, 90 degrees internal rotation, 90 degrees of flexion, and 70 degrees of internal rotation.  By placing the left leg on top of the right, it  feels as though leg lengths were equal.  The hip was dislocated and trials were removed.  The permanent 18D small sleeve was placed in the 18 x 13 stem and 36 plus 8 neck matching native anteversion.  The 32 plus 0 ceramic head was placed.  The hip was reduced with the same stability parameters.  Wound was copiously irrigated with saline solution and then short rotators and capsule reattached to the femur through drill holes with Ethibond suture. Fascia lata was closed over Hemovac drain with interrupted #1 Vicryl, subcu closed with #1 and #2-0 Vicryl and subcuticular with running 4-0 Monocryl.  Drain was hooked to suction.  Incision was cleaned and dried. Catheter for the Marcaine pain pump was placed and pump was initiated. Steri-Strips and  a bulky sterile dressing were applied.  She was then placed into a knee immobilizer, awakened and transported to recovery in stable condition.     Ollen Gross, M.D.     FA/MEDQ  D:  07/01/2010  T:  07/01/2010  Job:  [id number]  Electronically Signed by Ollen Gross M.D. on 07/03/2010 09:02:12 AM

## 2010-07-04 LAB — CBC
HCT: 28.1 % — ABNORMAL LOW (ref 36.0–46.0)
Hemoglobin: 8.9 g/dL — ABNORMAL LOW (ref 12.0–15.0)
MCH: 26.6 pg (ref 26.0–34.0)
MCHC: 31.7 g/dL (ref 30.0–36.0)
MCV: 84.1 fL (ref 78.0–100.0)
Platelets: 221 10*3/uL (ref 150–400)
RBC: 3.34 MIL/uL — ABNORMAL LOW (ref 3.87–5.11)
RDW: 13.4 % (ref 11.5–15.5)
WBC: 12.2 10*3/uL — ABNORMAL HIGH (ref 4.0–10.5)

## 2010-07-04 LAB — GLUCOSE, CAPILLARY
Glucose-Capillary: 181 mg/dL — ABNORMAL HIGH (ref 70–99)
Glucose-Capillary: 186 mg/dL — ABNORMAL HIGH (ref 70–99)
Glucose-Capillary: 198 mg/dL — ABNORMAL HIGH (ref 70–99)
Glucose-Capillary: 202 mg/dL — ABNORMAL HIGH (ref 70–99)
Glucose-Capillary: 139 mg/dL — ABNORMAL HIGH (ref 70–99)

## 2010-07-04 LAB — PREPARE RBC (CROSSMATCH)

## 2010-07-05 LAB — TYPE AND SCREEN
ABO/RH(D): A POS
Antibody Screen: NEGATIVE
Unit division: 0
Unit division: 0

## 2010-07-05 LAB — CBC
HCT: 32.8 % — ABNORMAL LOW (ref 36.0–46.0)
Hemoglobin: 10.5 g/dL — ABNORMAL LOW (ref 12.0–15.0)
MCH: 26.6 pg (ref 26.0–34.0)
MCHC: 32 g/dL (ref 30.0–36.0)
MCV: 83 fL (ref 78.0–100.0)
Platelets: 259 10*3/uL (ref 150–400)
RBC: 3.95 MIL/uL (ref 3.87–5.11)
RDW: 14.4 % (ref 11.5–15.5)
WBC: 12.3 10*3/uL — ABNORMAL HIGH (ref 4.0–10.5)

## 2010-07-05 LAB — GLUCOSE, CAPILLARY
Glucose-Capillary: 155 mg/dL — ABNORMAL HIGH (ref 70–99)
Glucose-Capillary: 212 mg/dL — ABNORMAL HIGH (ref 70–99)
Glucose-Capillary: 156 mg/dL — ABNORMAL HIGH (ref 70–99)
Glucose-Capillary: 180 mg/dL — ABNORMAL HIGH (ref 70–99)

## 2010-07-06 ENCOUNTER — Other Ambulatory Visit: Payer: Self-pay | Admitting: Family Medicine

## 2010-07-06 ENCOUNTER — Inpatient Hospital Stay
Admission: RE | Admit: 2010-07-06 | Discharge: 2010-07-31 | Disposition: A | Discharge status: Home / Self Care | Payer: No Typology Code available for payment source | Source: Ambulatory Visit | Attending: Internal Medicine | Admitting: Internal Medicine

## 2010-07-06 LAB — GLUCOSE, CAPILLARY
Glucose-Capillary: 108 mg/dL — ABNORMAL HIGH (ref 70–99)
Glucose-Capillary: 189 mg/dL — ABNORMAL HIGH (ref 70–99)

## 2010-07-08 LAB — GLUCOSE, CAPILLARY
Glucose-Capillary: 133 mg/dL — ABNORMAL HIGH (ref 70–99)
Glucose-Capillary: 175 mg/dL — ABNORMAL HIGH (ref 70–99)
Glucose-Capillary: 219 mg/dL — ABNORMAL HIGH (ref 70–99)
Glucose-Capillary: 220 mg/dL — ABNORMAL HIGH (ref 70–99)

## 2010-07-08 NOTE — H&P (Addendum)
[name], [name] NO.:  [id number]  MEDICAL RECORD NO.:  [id number]            PATIENT TYPE:  LOCATION:                                 FACILITY:  PHYSICIAN:  Ollen Gross, M.D.    DATE OF BIRTH:  [birth year]  DATE OF ADMISSION:  07/01/2010 DATE OF DISCHARGE:  07/06/2010                             HISTORY & PHYSICAL   CHIEF COMPLAINT:  Left hip pain.  BRIEF HISTORY:  Jennifer Blackburn has been followed by Dr. Lequita Halt for worsening pain and dysfunction in the left hip.  She has had intra- articular injections, the last one was back in October.  She had relief initially, but unfortunately the pain began to worsen very shortly afterwards.  She states at this time she is having pain at nighttime and it is affecting what she is able to do.  [name] now presents for left total hip arthroplasty.  Her primary care physician is Dr. Lodema Hong and she has been cleared for surgery.  MEDICATION ALLERGIES: 1. IBUPROFEN. 2. SULFA DRUGS. 3. NSAIDS.  These also cause hives.  PRIMARY CARE PHYSICIAN:  Milus Mallick. Lodema Hong, M.D.  CARDIOLOGIST:  Dr. Lynnea Ferrier at [facility].  CURRENT MEDICATIONS: 1. Hydrocodone. 2. Accupril. 3. Hydrochlorothiazide. 4. Pravastatin. 5. Glipizide. 6. Metformin. 7. Lantus. 8. Lopressor. 9. Verapamil. 10.Synthroid. 11.Imipramine.  PAST MEDICAL HISTORY: 1. End-stage arthritis of the left hip. 2. Dentures. 3. Hypertension. 4. Diabetes. 5. History of thyroid cancer. 6. Arthritis.  PAST SURGICAL HISTORY: 1. Tonsillectomy. 2. Knee arthroscopy. 3. Hysterectomy. 4. Back surgery. 5. Surgery for thyroid cancer.  FAMILY HISTORY:  Father passed at the age of 5, he had myocardial infarction.  Mother passed at the age of 58, she had congestive heart failure.  SOCIAL HISTORY:  The patient is married.  She is retired.  Denies past or present use of alcohol or tobacco products.  She has 2 children.  She lives at home  with her husband.  She does plan to go home following her hospital stay.  Her husband and her daughter are lined up to be her caregivers.  REVIEW OF SYSTEMS:  GENERAL:  Positive for occasional night sweats. Negative for fevers, chills, or weight change.  HEENT:  Positive for occasional insomnia and the patient does wear dentures.  DERMATOLOGIC: Negative for rash or lesion.  RESPIRATORY:  Negative for shortness of breath at rest or with exertion.  Last chest x-ray was September 2011. CARDIOVASCULAR:  Negative for chest pain or palpitations.  Most recent EKG was March 2012.  GI:  Negative for nausea, vomiting, or diarrhea. GU:  Negative for hematuria or dysuria.  MUSCULOSKELETAL:  Positive for joint pain, muscular pain, and back pain.  PHYSICAL EXAMINATION:  VITAL SIGNS:  Pulse 80, respirations 18, blood pressure 118/80 in the left arm. GENERAL:  Jennifer Blackburn is alert and oriented x3.  She is well developed, well nourished, in no apparent distress. HEENT:  Normocephalic, atraumatic.  Extraocular movements intact. NECK:  Supple, full range of motion without lymphadenopathy. CHEST:  Lungs are clear to auscultation bilaterally without wheezes. HEART:  Regular rate and rhythm without murmur.  ABDOMEN:  Bowel sounds present in all 4 quadrants.  Abdomen is soft, nontender, nondistended to palpation. EXTREMITIES:  Left hip flexion to 100 degrees, no internal rotation. Only 10-20 degrees of external rotation, 20 degrees of abduction. SKIN:  Unremarkable. NEUROLOGIC:  Intact. PERIPHERAL VASCULAR:  Carotid pulses 2+ bilaterally without bruit.  RADIOGRAPHS:  AP and lateral views of the left hip reveal joint space narrowing with large osteophyte formation superolateral on acetabulum, inferomedial to the femoral neck.  IMPRESSION:  Joint space narrowing of the left hip with osteophyte formation.  PLAN:  Left total hip arthroplasty to be performed by Dr. Lequita Halt.     Rozell Searing,  PAC   ______________________________ Ollen Gross, M.D.    LD/MEDQ  D:  06/02/2010  T:  06/02/2010  Job:  [id number]  Electronically Signed by Ollen Gross M.D. on 07/08/2010 09:53:04 AM Electronically Signed by Rozell Searing  on 07/09/2010 08:37:35 AM

## 2010-07-09 LAB — GLUCOSE, CAPILLARY
Glucose-Capillary: 143 mg/dL — ABNORMAL HIGH (ref 70–99)
Glucose-Capillary: 228 mg/dL — ABNORMAL HIGH (ref 70–99)
Glucose-Capillary: 194 mg/dL — ABNORMAL HIGH (ref 70–99)
Glucose-Capillary: 238 mg/dL — ABNORMAL HIGH (ref 70–99)

## 2010-07-10 LAB — GLUCOSE, CAPILLARY
Glucose-Capillary: 213 mg/dL — ABNORMAL HIGH (ref 70–99)
Glucose-Capillary: 130 mg/dL — ABNORMAL HIGH (ref 70–99)
Glucose-Capillary: 89 mg/dL (ref 70–99)
Glucose-Capillary: 146 mg/dL — ABNORMAL HIGH (ref 70–99)
Glucose-Capillary: 306 mg/dL — ABNORMAL HIGH (ref 70–99)

## 2010-07-11 LAB — GLUCOSE, CAPILLARY
Glucose-Capillary: 149 mg/dL — ABNORMAL HIGH (ref 70–99)
Glucose-Capillary: 204 mg/dL — ABNORMAL HIGH (ref 70–99)
Glucose-Capillary: 170 mg/dL — ABNORMAL HIGH (ref 70–99)
Glucose-Capillary: 249 mg/dL — ABNORMAL HIGH (ref 70–99)

## 2010-07-12 LAB — GLUCOSE, CAPILLARY
Glucose-Capillary: 143 mg/dL — ABNORMAL HIGH (ref 70–99)
Glucose-Capillary: 242 mg/dL — ABNORMAL HIGH (ref 70–99)

## 2010-07-13 LAB — GLUCOSE, CAPILLARY
Glucose-Capillary: 163 mg/dL — ABNORMAL HIGH (ref 70–99)
Glucose-Capillary: 188 mg/dL — ABNORMAL HIGH (ref 70–99)

## 2010-07-14 LAB — GLUCOSE, CAPILLARY
Glucose-Capillary: 151 mg/dL — ABNORMAL HIGH (ref 70–99)
Glucose-Capillary: 225 mg/dL — ABNORMAL HIGH (ref 70–99)

## 2010-07-15 LAB — GLUCOSE, CAPILLARY
Glucose-Capillary: 162 mg/dL — ABNORMAL HIGH (ref 70–99)
Glucose-Capillary: 266 mg/dL — ABNORMAL HIGH (ref 70–99)

## 2010-07-16 LAB — GLUCOSE, CAPILLARY: Glucose-Capillary: 140 mg/dL — ABNORMAL HIGH (ref 70–99)

## 2010-07-17 LAB — GLUCOSE, CAPILLARY
Glucose-Capillary: 301 mg/dL — ABNORMAL HIGH (ref 70–99)
Glucose-Capillary: 115 mg/dL — ABNORMAL HIGH (ref 70–99)
Glucose-Capillary: 261 mg/dL — ABNORMAL HIGH (ref 70–99)

## 2010-07-18 LAB — GLUCOSE, CAPILLARY
Glucose-Capillary: 126 mg/dL — ABNORMAL HIGH (ref 70–99)
Glucose-Capillary: 218 mg/dL — ABNORMAL HIGH (ref 70–99)

## 2010-07-19 LAB — GLUCOSE, CAPILLARY
Glucose-Capillary: 154 mg/dL — ABNORMAL HIGH (ref 70–99)
Glucose-Capillary: 293 mg/dL — ABNORMAL HIGH (ref 70–99)

## 2010-07-20 LAB — GLUCOSE, CAPILLARY
Glucose-Capillary: 165 mg/dL — ABNORMAL HIGH (ref 70–99)
Glucose-Capillary: 215 mg/dL — ABNORMAL HIGH (ref 70–99)

## 2010-07-20 NOTE — Discharge Summary (Signed)
Jennifer Blackburn, Jennifer Blackburn NO.:  [id number]  MEDICAL RECORD NO.:  [id number]           PATIENT TYPE:  I  LOCATION:  1601                         FACILITY:  [facility]  PHYSICIAN:  Ollen Gross, M.D.    DATE OF BIRTH:  January 08, 1946  DATE OF ADMISSION:  07/01/2010 DATE OF DISCHARGE:  07/06/2010                        DISCHARGE SUMMARY - REFERRING   ADMITTING DIAGNOSES: 1. Osteoarthritis, left hip. 2. Hypertension. 3. Diabetes. 4. History of thyroid cancer. 5. Hypercholesterolemia.  DISCHARGE DIAGNOSES: 1. Osteoarthritis left hip, status post left total hip replacement,     arthroplasty. 2. Postop acute blood loss anemia. 3. Status post transfusion without sequelae. 4. Hypertension. 5. Diabetes. 6. History of thyroid cancer. 7. Hypercholesterolemia.  PROCEDURE:  July 01, 2010, left total hip.  SURGEON:  Ollen Gross, MD  ASSISTANT:  Alexzandrew L. Julien Girt, [facility]  ANESTHESIA:  General.  CONSULTS:  None.  BRIEF HISTORY:  The patient is a 65 year old female with advanced arthritis of left hip, with progressive worsening pain, dysfunction, several intra-articular injections no longer helpful, now presents for total hip.  LABORATORY DATA:  Preop CBC showed hemoglobin 12.0, hematocrit 36.8, white cell count 7.1, platelets 294.  PT/INR 13.9 and 1.05 with PTT of 28.  Chem panel on admission slight low potassium at 3.4, glucose elevated 150, known diabetic.  Remaining Chem panel all within normal limits.  Preop UA negative.  Blood group type A positive.  Nasal swabs were negative.  Staph aureus negative for MRSA.  Serial CBCs were followed.  Hemoglobin down to 9.4, then 8.8 where stabilized.  It was noted that she was hemodynamically unstable with increased pulse and symptomatic situation, so she was given blood.  The patient's hemoglobin last night H and H was 10.5 and 32.8.  Serial BMETs were followed for 48 hours.  Electrolytes remained within normal  limits.  HOSPITAL COURSE:  The patient was admitted to [facility], taken to OR, underwent above-stated procedure without complication.  The patient tolerated well, later transferred to recovery room, orthopedic floor, started on p.o. and IV analgesic pain control following surgery, doing pretty well on the morning of day #1.  She wanted to go home after the hospital stay, so we got discharge planning involved.  She was started back on all of her home meds.  Hemoglobin was down to 9.4 postop.  She had excellent urinary output, started getting up out of bed with day #1 and therapy, walking short distances.  By day #2, she was slowly progressing with therapy and at this point she wanted to look into skilled facility, so we got social work involved.  Her hemoglobin was down to 8.8.  She was at this point asymptomatic with it.  Her blood pleasure was a little soft, but stable, put her on some iron supplementation.  We changed her dressing.  Incision looked good.  Later that day she started having some pressure issues and some lightheadedness, we gave her some fluids.  She had a little bit increased pulse on postop day #3 and due to the soft pressure and the lightheadedness from the afternoon before and also on day #3, we  decided to give her a couple units of blood.  She actually tolerated the blood well and her hemoglobin came back up to 10.5 noted on postop day #4. She was remained in the weekend.  Since no beds were found at that time, she remained in the hospital on Saturday and Sunday and she was seen back on Monday.  She was doing well, no complaints.  She was progressing with her therapy, but did need some extra time.  We are waiting on bed offers.  Once social worker located a bed, we would allow her transfer at that time.  DISCHARGE PLAN: 1. The patient to be transferred to skilled nurse facility of choice     on July 06, 2010. 2. Discharge diagnoses, please see  above.  DISCHARGE MEDICATIONS:  Current medications at time of transfer include: 1. Xarelto 10 mg daily for 3 weeks, then discontinue the Xarelto. 2. Colace 100 mg p.o. b.i.d. 3. Lopressor 50 mg p.o. b.i.d. 4. Lantus 30 units daily at bedtime. 5. Quinapril 10 mg every morning. 6. Sublingual nitroglycerin every 5 minutes as needed for chest pain. 7. Verapamil CR 180 mg p.o. every morning. 8. Levothyroxine 200 mcg daily. 9. Imipramine 75 mg p.o. q.h.s. 10.Glipizide 10 mg XL p.o. b.i.d. 11.Hydrochlorothiazide 25 mg daily. 12.Pravastatin 80 mg p.o. q.h.s. 13.Nu-Iron 150 mg daily for 2 weeks and discontinue the Nu-Iron. 14.Robaxin 500 mg p.o. q.6-8 h. p.r.n. spasm. 15.Percocet 5 mg 1 or 2 every 4-6 hours as needed for moderate pain. 16.Tylenol 325 1 or 2 every 4-6 hours as needed for mild pain,     temperature, or headache.  DIET:  Heart-healthy cardiac diet.  ACTIVITY:  She is partial weightbearing at 25% to 50% to the left lower extremity.  Gait training, ambulation, ADLs, hip precautions at all times.  Please note the patient may start showering; however, do not submerge the incision under water.  FOLLOWUP:  2 to 2-1/2 weeks from the date of surgery.  Please contact the office at [phone] to help arrange appointment, transfer the patient over to [facility] with Dr. Ollen Gross.  DISPOSITION:  Pending at this time, waiting on bed offers.  CONDITION UPON DISCHARGE:  Improving at time of dictation.     Alexzandrew L. Julien Girt, P.A.C.   ______________________________ Ollen Gross, M.D.    ALP/MEDQ  D:  07/06/2010  T:  07/06/2010  Job:  [id number]  Electronically Signed by Patrica Duel P.A.C. on 07/08/2010 12:47:15 PM Electronically Signed by Ollen Gross M.D. on 07/20/2010 07:10:22 AM

## 2010-07-21 LAB — GLUCOSE, CAPILLARY: Glucose-Capillary: 106 mg/dL — ABNORMAL HIGH (ref 70–99)

## 2010-07-22 LAB — GLUCOSE, CAPILLARY
Glucose-Capillary: 274 mg/dL — ABNORMAL HIGH (ref 70–99)
Glucose-Capillary: 90 mg/dL (ref 70–99)

## 2010-07-23 LAB — GLUCOSE, CAPILLARY
Glucose-Capillary: 227 mg/dL — ABNORMAL HIGH (ref 70–99)
Glucose-Capillary: 111 mg/dL — ABNORMAL HIGH (ref 70–99)
Glucose-Capillary: 265 mg/dL — ABNORMAL HIGH (ref 70–99)

## 2010-07-24 LAB — GLUCOSE, CAPILLARY
Glucose-Capillary: 124 mg/dL — ABNORMAL HIGH (ref 70–99)
Glucose-Capillary: 261 mg/dL — ABNORMAL HIGH (ref 70–99)

## 2010-07-25 LAB — GLUCOSE, CAPILLARY
Glucose-Capillary: 104 mg/dL — ABNORMAL HIGH (ref 70–99)
Glucose-Capillary: 216 mg/dL — ABNORMAL HIGH (ref 70–99)

## 2010-07-26 LAB — GLUCOSE, CAPILLARY: Glucose-Capillary: 101 mg/dL — ABNORMAL HIGH (ref 70–99)

## 2010-07-27 LAB — GLUCOSE, CAPILLARY
Glucose-Capillary: 158 mg/dL — ABNORMAL HIGH (ref 70–99)
Glucose-Capillary: 126 mg/dL — ABNORMAL HIGH (ref 70–99)
Glucose-Capillary: 206 mg/dL — ABNORMAL HIGH (ref 70–99)

## 2010-07-28 LAB — GLUCOSE, CAPILLARY
Glucose-Capillary: 115 mg/dL — ABNORMAL HIGH (ref 70–99)
Glucose-Capillary: 189 mg/dL — ABNORMAL HIGH (ref 70–99)

## 2010-07-29 LAB — GLUCOSE, CAPILLARY
Glucose-Capillary: 93 mg/dL (ref 70–99)
Glucose-Capillary: 224 mg/dL — ABNORMAL HIGH (ref 70–99)

## 2010-07-30 LAB — GLUCOSE, CAPILLARY
Glucose-Capillary: 150 mg/dL — ABNORMAL HIGH (ref 70–99)
Glucose-Capillary: 221 mg/dL — ABNORMAL HIGH (ref 70–99)

## 2010-07-31 LAB — GLUCOSE, CAPILLARY: Glucose-Capillary: 85 mg/dL (ref 70–99)

## 2010-08-07 NOTE — Op Note (Signed)
[name], [name] NO.:  [id number]   MEDICAL RECORD NO.:  [id number]          PATIENT TYPE:  OUT   LOCATION:  RAD                           FACILITY:  [facility]   PHYSICIAN:  Danae Orleans. Venetia Maxon, M.D.  DATE OF BIRTH:  11-16-1945   DATE OF PROCEDURE:  06/11/2004  DATE OF DISCHARGE:  06/07/2004                                 OPERATIVE REPORT   PREOPERATIVE DIAGNOSIS:  Herniated cervical disk with spondylosis, stenosis,  cervical radiculopathy and degenerative disk disease, C5-6 and C6-7 levels.   POSTOPERATIVE DIAGNOSIS:  Herniated cervical disk with spondylosis,  stenosis, cervical radiculopathy and degenerative disk disease, C5-6 and C6-  7 levels.   OPERATION PERFORMED:  Anterior cervical decompression and fusion C5-6 and C6-  7 levels with PEAK cages and morcellized bone autograft with anterior  cervical plate.   SURGEON:  Danae Orleans. Venetia Maxon, M.D.   ASSISTANT:  Hilda Lias, M.D.   ANESTHESIA:  General endotracheal.   ESTIMATED BLOOD LOSS:  100 mL.   COMPLICATIONS:  None.   DISPOSITION:  Recovery.   INDICATIONS FOR PROCEDURE:  Jennifer Blackburn is a 65 year old with cervical  spondylosis and cervical stenosis with bilateral upper extremity pain, left  greater than right with cervical spinal cord compression.  She appears to be  developing ossification of the posterior longitudinal ligament with large  bone spurs compressing her spinal cord at the C5-6 and C6-7 levels, worse at  the C6-7 level.  It was elected to take  to surgery for anterior cervical  decompression and fusion at these affected levels.   DESCRIPTION OF PROCEDURE:  [name] was brought to the operating room.  Following satisfactory and uncomplicated induction of general endotracheal  anesthesia and placement of intravenous lines, the patient was placed in  supine position on the operating table.  The neck was placed in slight  extension and she was placed in 10 pounds of halter traction.   The anterior  neck was then prepped and draped in the usual sterile fashion.  The area of  planned incision was infiltrated with 0.25% Marcaine and 0.5% lidocaine  1:200,000 epinephrine.  Incision was made from the midline to the anterior  border of the sternocleidomastoid muscle, along the left side of the  midline.  Subplatysmal dissection was performed exposing the anterior border  of the sternocleidomastoid muscle. Using blunt dissection, the carotid  sheath was kept lateral, the trachea and esophagus kept medial exposing the  anterior cervical spine.  A bent spinal needle was placed at what was felt  to be the C4-5 and C5-6 levels because of the patient's large body habitus  and intraoperative x-ray demonstrated the needle at the C4-5 level.  It was  not possible to visualize below that.  The longus colli muscles were taken  down from the anterior cervical spine  with electrocautery and Key elevator.  Ventral osteophytes were removed with Leksell rongeur.  Shadowline retractor  was placed along with up down retractor to facilitate exposure.  The end  plates of Y8-6 and C6-7 were then incised with a 15 blade and disk material  removed in piecemeal fashion using a variety of Carlens curets.  A disk  space spreader was placed.  There was initially not much movement at either  level but with subsequent disk removal and removal of the osteophytes, the  disk spaces did open up somewhat.  The microscope was brought into the field  and using high power microscopic visualization initially at the C5-6 level,  the end plates of C5 and C6 were decorticated and large uncinate spurs were  drilled down.  The remaining osteophytes were removed.  The posterior longitudinal ligament was removed in piecemeal fashion  resulting in decompression of central spinal cord dura and both C6 nerve  roots exiting out the neural foramina.  Hemostasis was assured with Gelfoam  soaked in Thrombin.  After using trial  sizers, a 6 mm anterior cervical 5  degree lordotic anterior cervical cage was selected, packed with morcellized  bone autograft which had been preserved from end plate drilling and this was  inserted into the interspace and countersunk appropriately.  Attention was  then turned to the C6-7 level where similar decompression was performed and  at this level, the entire posterior longitudinal ligament was ossified.  It  was not stuck to the dura.  After using fine hooks and 1 mm Kerrisons to  mobilized this osteophytic shelf of ligament, the central spinal cord dura  was decompressed and both the C7 nerve roots were decompressed as they  extended out the neural foramina.  Hemostasis was again assured and after  trial sizing, an 8 mm PEAK cage was selected and packed with morcellized  bone autograft, inserted into the interspace and countersunk appropriately.  Subsequently, an Alphatec 31 mm anterior cervical plate was then affixed to  the anterior cervical spine using 12 mm variable angle screws, two at C5,  two at C6 and two at C7.  All screws had excellent purchase.  Locking  mechanisms were engaged.  Final X-ray was obtained which demonstrated the  upper aspect of the C5 instrumentation but none of the rest of the hardware  could be visualized on x-ray.  Prior to placing the plate, the halter weight  was removed.  Hemostasis was then assured.  The soft tissues were inspected  and found to be in good repair.  The platysma layer was reapproximated with  3-0 Vicryl sutures.  The skin edges were reapproximated with interrupted 3-0  Vicryl subcuticular stitch.  The wound was dressed with Dermabond.  The  patient was extubated in the operating room and taken to the recovery room  in stable and satisfactory condition having tolerated the operation well.  All counts were correct at the end of the case.      JDS/MEDQ  D:  06/11/2004  T:  06/11/2004  Job:  [id number]

## 2010-08-07 NOTE — Op Note (Signed)
NAME:  Jennifer Blackburn, MCCLENNEY                        ACCOUNT NO.:  [id number]   MEDICAL RECORD NO.:  [id number]                   PATIENT TYPE:  AMB   LOCATION:  DAY                                  FACILITY:  [facility]   PHYSICIAN:  R. Roetta Sessions, M.D.              DATE OF BIRTH:  1945/03/25   DATE OF PROCEDURE:  10/21/2003  DATE OF DISCHARGE:                                  PROCEDURE NOTE   PROCEDURES:  1. Diagnostic EGD.  2. Colonoscopy.   INDICATIONS FOR PROCEDURE:  The patient is a 65 year old lady referred at  the courtesy of Dr. Syliva Overman for colorectal cancer screening.  She  also has significant gastroesophageal reflux disease of several months  duration, now well controlled on Nexium 40 mg daily.  She has been referred  for a colonoscopy as well as an EGD to further evaluate her reflux symptoms.  She does not have any alarm features.  No family history of colorectal  neoplasia, never had colon damage previously.  EGD and colonoscopy now being  done.  We discussed with the patient at length the potential risks,  benefits, and alternatives.  Questions have been answered.  She is  agreeable.  Please see the documentation on medical record.   INSTRUMENT:  Olympus video chip system.   DESCRIPTION OF PROCEDURE:  O2, blood pressure, pulse, respirations were  monitored throughout the entire procedure.  Conscious sedation with Versed 4  mg IV, Demerol 100 mg IV in divided doses.  EGD findings:  Examination of  the tubular esophagus revealed no mucosal abnormalities.  The EG junction  was easily traversed and gastric cavity was emptied.  Insufflated well with  air.  After examination of gastric mucosa, a retroflex view of the proximal  stomach, esophagus, and gastric junction demonstrated no abnormalities.  Pylorus patent and easily traversed.  Examination of the bulb and second  portion revealed no abnormalities.  Therapeutic/diagnostic maneuvers performed:  None.  The patient  tolerated  the procedure well and was prepared for colonoscopy.   Digital rectal examination revealed no abnormalities.  Endoscopic findings:  Prep was good.  Examination of the rectal mucosa, including a retroflexed  view of the anal verge, revealed no abnormalities.  Colonic mucosa was  __________through the left transverse and right colon to the appendix and  ileocecal valve and cecum.  These structures were well seen and photographed  for the record.  The scope was slowly withdrawn ___________ The only  abnormalities seen were sigmoid diverticula.  The remainder of the colonic  mucosa appeared normal.  The patient tolerated both procedures well.   IMPRESSION:  1. Esophagogastroduodenoscopy:  Normal esophagus, stomach, D1, and D2.  2. Colonoscopy findings:  Normal rectum, left-sided diverticulum, colonic     mucosa appeared normal.   RECOMMENDATIONS:  1. Continue Nexium 40 mg daily.  Anti-reflux measures/literature provided to     Ms. Cosgriff.  2. Weight loss  would be in her best interest.  3. Diverticulosis literature provided to Ms. Herald.  4. Repeat colonoscopy in 10 years.      ___________________________________________                                            Jonathon Bellows, M.D.   RMR/MEDQ  D:  10/21/2003  T:  10/21/2003  Job:  [id number]   cc:   Milus Mallick. Lodema Hong, M.D.  [address]  [place], [place] [address]  Fax: [phone]

## 2010-08-11 ENCOUNTER — Encounter: Payer: Self-pay | Admitting: Family Medicine

## 2010-08-11 ENCOUNTER — Ambulatory Visit (INDEPENDENT_AMBULATORY_CARE_PROVIDER_SITE_OTHER): Payer: Medicare Other | Admitting: Family Medicine

## 2010-08-11 VITALS — BP 114/70 | HR 71 | Resp 16 | Ht 65.0 in | Wt 197.0 lb

## 2010-08-11 DIAGNOSIS — M479 Spondylosis, unspecified: Secondary | ICD-10-CM

## 2010-08-11 DIAGNOSIS — E119 Type 2 diabetes mellitus without complications: Secondary | ICD-10-CM

## 2010-08-11 DIAGNOSIS — I10 Essential (primary) hypertension: Secondary | ICD-10-CM

## 2010-08-11 DIAGNOSIS — E669 Obesity, unspecified: Secondary | ICD-10-CM

## 2010-08-11 DIAGNOSIS — E785 Hyperlipidemia, unspecified: Secondary | ICD-10-CM

## 2010-08-11 MED ORDER — HYDROCODONE-ACETAMINOPHEN 5-500 MG PO TABS: ORAL_TABLET | ORAL | Status: DC

## 2010-08-11 MED ORDER — HYDROCODONE-ACETAMINOPHEN 5-500 MG PO TABS: ORAL_TABLET | ORAL | Status: AC

## 2010-08-11 NOTE — Patient Instructions (Signed)
F/u in 2 months.  Pls resume the metformin and continue other meds for your diabetes.  Your incision looks good.  Pain med sent in as requested.  Blood pressure is excellent, no HCTZ needed.  HBA1C and chem 7 in 2 months.   Pls try to attend a session on diabetes  I am thankful your surgery went well

## 2010-08-11 NOTE — Progress Notes (Signed)
  Subjective:    Patient ID: Jennifer Blackburn, female    DOB: 10/20/1945, 65 y.o.   MRN: [id number]  HPI  Pt states she is gradually improving as far as her pain and mobility, are concerned.Requests some pain med for as needed use , esp at night . She is getting physical therapy at home 3 times weekly.  Blood sugars fluctuating , but has not been on metformin, noted better control while in the nursing home where she recuperated from recent hip surgery, I encouraged her to stick with the diet as closely as possible, and she is to resume the metformin She reports reduction in her meds at the facility and in hospital for her bP as she became hypotensive, currently asymptomatic and here for BP review also.  Review of Systems Denies recent fever or chills. Denies sinus pressure, nasal congestion, ear pain or sore throat. Denies chest congestion, productive cough or wheezing. Denies chest pains, palpitations, paroxysmal nocturnal dyspnea, orthopnea and leg swelling Denies abdominal pain, nausea, vomiting,diarrhea or constipation.  Denies rectal bleeding or change in bowel movement. Denies dysuria, frequency, hesitancy or incontinence.  Denies headaches, seizure, numbness, or tingling. Denies depression, anxiety or insomnia. Denies skin break down or rash.        Objective:   Physical Exam Patient alert and oriented and in no Cardiopulmonary distress.  HEENT: No facial asymmetry, EOMI, no sinus tenderness,  Oropharynx pink and moist.  Neck supple no adenopathy.  Chest: Clear to auscultation bilaterally.  CVS: S1, S2 no murmurs, no S3.  ABD: Soft non tender. Bowel sounds normal.  Ext: No edema  MS: decreased  ROM spine, and , hips and adequate in shoulders and  knees.  Skin: Intact, no ulcerations or rash noted.  Psych: Good eye contact, normal affect. Memory intact not anxious or depressed appearing.  CNS: CN 2-12 intact, power, tone and sensation normal throughout.         Assessment & Plan:

## 2010-08-17 ENCOUNTER — Encounter: Payer: Self-pay | Admitting: Family Medicine

## 2010-08-17 NOTE — Assessment & Plan Note (Signed)
Controlled, no change in medication  

## 2010-08-17 NOTE — Assessment & Plan Note (Signed)
Currently uncontrolled, fastings averaging around 140, pt to resume metformin

## 2010-08-17 NOTE — Assessment & Plan Note (Signed)
Improved. Pt applauded on succesful weight loss through lifestyle change, and encouraged to continue same. Weight loss goal set for the next several months.  

## 2010-09-01 ENCOUNTER — Other Ambulatory Visit: Payer: Self-pay | Admitting: *Deleted

## 2010-09-01 MED ORDER — LEVOTHYROXINE SODIUM 200 MCG PO TABS: 200.0000 ug | ORAL_TABLET | Freq: Every day | ORAL | Status: DC

## 2010-09-08 ENCOUNTER — Ambulatory Visit (HOSPITAL_COMMUNITY)
Admission: RE | Admit: 2010-09-08 | Discharge: 2010-09-08 | Disposition: A | Discharge status: Home / Self Care | Payer: Medicare Other | Source: Ambulatory Visit | Attending: Orthopedic Surgery | Admitting: Orthopedic Surgery

## 2010-09-08 DIAGNOSIS — R269 Unspecified abnormalities of gait and mobility: Secondary | ICD-10-CM | POA: Insufficient documentation

## 2010-09-08 DIAGNOSIS — M25659 Stiffness of unspecified hip, not elsewhere classified: Secondary | ICD-10-CM | POA: Insufficient documentation

## 2010-09-08 DIAGNOSIS — M25559 Pain in unspecified hip: Secondary | ICD-10-CM | POA: Insufficient documentation

## 2010-09-08 DIAGNOSIS — I10 Essential (primary) hypertension: Secondary | ICD-10-CM | POA: Insufficient documentation

## 2010-09-08 DIAGNOSIS — M6281 Muscle weakness (generalized): Secondary | ICD-10-CM | POA: Insufficient documentation

## 2010-09-08 DIAGNOSIS — IMO0001 Reserved for inherently not codable concepts without codable children: Secondary | ICD-10-CM | POA: Insufficient documentation

## 2010-09-08 DIAGNOSIS — E119 Type 2 diabetes mellitus without complications: Secondary | ICD-10-CM | POA: Insufficient documentation

## 2010-09-11 ENCOUNTER — Ambulatory Visit (HOSPITAL_COMMUNITY)
Admission: RE | Admit: 2010-09-11 | Discharge: 2010-09-11 | Disposition: A | Discharge status: Home / Self Care | Payer: Medicare Other | Source: Ambulatory Visit | Attending: Family Medicine | Admitting: Family Medicine

## 2010-09-14 ENCOUNTER — Ambulatory Visit (HOSPITAL_COMMUNITY)
Admission: RE | Admit: 2010-09-14 | Discharge: 2010-09-14 | Disposition: A | Discharge status: Home / Self Care | Payer: Medicare Other | Source: Ambulatory Visit | Attending: Physical Therapy | Admitting: Physical Therapy

## 2010-09-17 ENCOUNTER — Ambulatory Visit (HOSPITAL_COMMUNITY)
Admission: RE | Admit: 2010-09-17 | Discharge: 2010-09-17 | Disposition: A | Discharge status: Home / Self Care | Payer: Medicare Other | Source: Ambulatory Visit | Attending: Family Medicine | Admitting: Family Medicine

## 2010-09-21 ENCOUNTER — Ambulatory Visit (HOSPITAL_COMMUNITY)
Admission: RE | Admit: 2010-09-21 | Discharge: 2010-09-21 | Disposition: A | Discharge status: Home / Self Care | Payer: Medicare Other | Source: Ambulatory Visit | Attending: Family Medicine | Admitting: Family Medicine

## 2010-09-21 DIAGNOSIS — M6281 Muscle weakness (generalized): Secondary | ICD-10-CM | POA: Insufficient documentation

## 2010-09-21 DIAGNOSIS — M25659 Stiffness of unspecified hip, not elsewhere classified: Secondary | ICD-10-CM | POA: Insufficient documentation

## 2010-09-21 DIAGNOSIS — IMO0001 Reserved for inherently not codable concepts without codable children: Secondary | ICD-10-CM | POA: Insufficient documentation

## 2010-09-21 DIAGNOSIS — I10 Essential (primary) hypertension: Secondary | ICD-10-CM | POA: Insufficient documentation

## 2010-09-21 DIAGNOSIS — E119 Type 2 diabetes mellitus without complications: Secondary | ICD-10-CM | POA: Insufficient documentation

## 2010-09-21 DIAGNOSIS — M25559 Pain in unspecified hip: Secondary | ICD-10-CM | POA: Insufficient documentation

## 2010-09-21 DIAGNOSIS — R269 Unspecified abnormalities of gait and mobility: Secondary | ICD-10-CM | POA: Insufficient documentation

## 2010-09-25 ENCOUNTER — Ambulatory Visit (HOSPITAL_COMMUNITY)
Admission: RE | Admit: 2010-09-25 | Discharge: 2010-09-25 | Disposition: A | Discharge status: Home / Self Care | Payer: Medicare Other | Source: Ambulatory Visit | Attending: *Deleted | Admitting: *Deleted

## 2010-09-29 ENCOUNTER — Ambulatory Visit (HOSPITAL_COMMUNITY)
Admission: RE | Admit: 2010-09-29 | Discharge: 2010-09-29 | Disposition: A | Discharge status: Home / Self Care | Payer: Medicare Other | Source: Ambulatory Visit | Attending: Family Medicine | Admitting: Family Medicine

## 2010-09-29 NOTE — Progress Notes (Signed)
Physical Therapy Treatment Patient Name: Jennifer Blackburn [id number] Date: 09/29/2010   Visit #: 7/7  Time In:  1:46 Time Out: 2:23  Subjective: 2/10 LBP; Hip not hurting more low back.   Objective:  Pt presents with more normalized gt pattern.     Mobility (including Balance) Ambulation/Gait Ambulation/Gait: Yes (With book on head to enhance equal gt pattern.) Ambulation/Gait Assistance: 7: Independent Ambulation Distance (Feet): 34 Feet Assistive device: None Gait Pattern: Within Functional Limits     Exercise/Treatments @FLOW ([id number])@  Goals PT Short Term Goals Short Term Goal 1: Independent in HEP Long Term Goal 1 Progress: Met Short Term Goal 2: Painless than 2/10 to allow sleeping without waking. Long Term Goal 2 Progress: Partly met PT Long Term Goals Long Term Goal 1: Strength 4+/5 to allow up and down stairs reciprocally Long Term Goal 1 Progress: Progressing toward goal Long Term Goal 2: Ambulate independently on all surfaces to return to prior level of function Long Term Goal 2 Progress: Progressing toward goal Long Term Goal 3: Able to be up on her fett 1 hr to sweep 2 rooms at a time Long Term Goal 3 Progress: Progressing toward goal End of Session Patient Active Problem List  Diagnoses  . DIABETES MELLITUS, TYPE II, WITHOUT COMPLICATIONS  . HYPERLIPIDEMIA  . OBESITY, UNSPECIFIED  . DEPRESSION  . CARPAL TUNNEL SYNDROME, BILATERAL  . ACUTE SEROUS OTITIS MEDIA  . ESSENTIAL HYPERTENSION, BENIGN  . CAD  . ACUTE SINUSITIS, UNSPECIFIED  . ALLERGIC RHINITIS CAUSE UNSPECIFIED  . GERD  . DYSPEPSIA  . DEGENERATIVE JOINT DISEASE, LUMBAR SPINE  . FATIGUE  . HEADACHE,  CHRONIC  . CHEST PAIN UNSPECIFIED  . OTHER DYSPHAGIA  . DIARRHEA   PT - End of Session Activity Tolerance: Patient tolerated treatment well  Assessment: Pt completes therex without difficulty. Able to ascend/descend stairs with one handrail and no AD with step to pattern. Increased quad control displayed with lateral and forward step ups.   Plan: Continue with PT POC.   Seth Bake Leah 09/29/2010, 2:40 PM

## 2010-10-01 ENCOUNTER — Other Ambulatory Visit: Payer: Self-pay | Admitting: Family Medicine

## 2010-10-01 ENCOUNTER — Ambulatory Visit (HOSPITAL_COMMUNITY)
Admission: RE | Admit: 2010-10-01 | Discharge: 2010-10-01 | Disposition: A | Discharge status: Home / Self Care | Payer: Medicare Other | Source: Ambulatory Visit | Attending: Family Medicine | Admitting: Family Medicine

## 2010-10-01 NOTE — Progress Notes (Cosign Needed)
Physical Therapy Treatment Patient Name: Jennifer Blackburn'J Date: 10/01/2010  Visit #: 8/8  Time In: 1:48  Time Out: 2:30  Subjective: I feel ready to finish here and continue at home. Pt reports no functional limitations. Pt reports she is able to come from sit to stand without UE support. She is able to get in and out of shower without difficulty and stand during shower. Pt reports she does not use SPC at home.  Objective:  L hip 5/5 throughout. Pt is able to ascend/descend stairs with handrail and SPC.   Exercise/Treatments See doc flowsheets.   Goals PT Short Term Goals Long Term Goal 1 Progress: Met Long Term Goal 2 Progress: Met PT Long Term Goals Long Term Goal 2 Progress: Partly met Long Term Goal 3 Progress: Partly met End of Session Patient Active Problem List  Diagnoses  . DIABETES MELLITUS, TYPE II, WITHOUT COMPLICATIONS  . HYPERLIPIDEMIA  . OBESITY, UNSPECIFIED  . DEPRESSION  . CARPAL TUNNEL SYNDROME, BILATERAL  . ACUTE SEROUS OTITIS MEDIA  . ESSENTIAL HYPERTENSION, BENIGN  . CAD  . ACUTE SINUSITIS, UNSPECIFIED  . ALLERGIC RHINITIS CAUSE UNSPECIFIED  . GERD  . DYSPEPSIA  . DEGENERATIVE JOINT DISEASE, LUMBAR SPINE  . FATIGUE  . HEADACHE, CHRONIC  . CHEST PAIN UNSPECIFIED  . OTHER DYSPHAGIA  . DIARRHEA   PT - End of Session Activity Tolerance: Patient tolerated treatment well PT Assessment and Plan Clinical Impression Statement: Most goal have been met. Pt feels she can continue on her own. PT Treatment/Interventions: Therapeutic exercise PT Plan: D/C to HEP   Antonieta Iba / Donnamae Jude, PT 10/01/2010, 2:34 PM

## 2010-10-01 NOTE — Progress Notes (Deleted)
Physical Therapy Treatment Patient Name: Jennifer Blackburn [id number] Date: 10/01/2010  Visit #: 8/8  Time In: 1:48  Time Out: 2:30  Subjective: I feel ready to finish here and continue at home. Pt reports no functional limitations. Pt reports she is able to come from sit to stand without UE support. She is able to get in and out of shower without difficulty and stand during shower. Pt reports she does not use SPC at home.  Objective:  L hip 5/5 throughout. Pt is able to ascend/descend stairs with handrail and SPC.   Exercise/Treatments See doc flowsheets.   Goals PT Short Term Goals Long Term Goal 1 Progress: Met Long Term Goal 2 Progress: Met PT Long Term Goals Long Term Goal 2 Progress: Partly met Long Term Goal 3 Progress: Partly met End of Session Patient Active Problem List  Diagnoses  . DIABETES MELLITUS, TYPE II, WITHOUT COMPLICATIONS  . HYPERLIPIDEMIA  . OBESITY, UNSPECIFIED  . DEPRESSION  . CARPAL TUNNEL SYNDROME, BILATERAL  . ACUTE SEROUS OTITIS MEDIA  . ESSENTIAL HYPERTENSION, BENIGN  . CAD  . ACUTE SINUSITIS, UNSPECIFIED  . ALLERGIC RHINITIS CAUSE UNSPECIFIED  . GERD  . DYSPEPSIA  . DEGENERATIVE JOINT DISEASE, LUMBAR SPINE  . FATIGUE  . HEADACHE, CHRONIC  . CHEST PAIN UNSPECIFIED  . OTHER DYSPHAGIA  . DIARRHEA   PT - End of Session Activity Tolerance: Patient tolerated treatment well PT Assessment and Plan Clinical Impression Statement: Most goal have been met. Pt feels she can continue on her own. PT Treatment/Interventions: Therapeutic exercise PT Plan: D/C to HEP   RUSSELL,CINDY 10/01/2010, 2:44 PM

## 2010-10-05 ENCOUNTER — Encounter: Payer: Self-pay | Admitting: Family Medicine

## 2010-10-10 ENCOUNTER — Other Ambulatory Visit: Payer: Self-pay | Admitting: Family Medicine

## 2010-10-13 ENCOUNTER — Encounter: Payer: Self-pay | Admitting: Family Medicine

## 2010-10-13 ENCOUNTER — Ambulatory Visit (INDEPENDENT_AMBULATORY_CARE_PROVIDER_SITE_OTHER): Payer: Medicare Other | Admitting: Family Medicine

## 2010-10-13 VITALS — BP 140/70 | HR 67 | Resp 16 | Ht 64.5 in | Wt 194.1 lb

## 2010-10-13 DIAGNOSIS — Z139 Encounter for screening, unspecified: Secondary | ICD-10-CM

## 2010-10-13 DIAGNOSIS — I10 Essential (primary) hypertension: Secondary | ICD-10-CM

## 2010-10-13 DIAGNOSIS — E559 Vitamin D deficiency, unspecified: Secondary | ICD-10-CM

## 2010-10-13 DIAGNOSIS — R0989 Other specified symptoms and signs involving the circulatory and respiratory systems: Secondary | ICD-10-CM

## 2010-10-13 DIAGNOSIS — N281 Cyst of kidney, acquired: Secondary | ICD-10-CM

## 2010-10-13 DIAGNOSIS — M899 Disorder of bone, unspecified: Secondary | ICD-10-CM

## 2010-10-13 DIAGNOSIS — R5383 Other fatigue: Secondary | ICD-10-CM

## 2010-10-13 DIAGNOSIS — E785 Hyperlipidemia, unspecified: Secondary | ICD-10-CM

## 2010-10-13 DIAGNOSIS — E119 Type 2 diabetes mellitus without complications: Secondary | ICD-10-CM

## 2010-10-13 DIAGNOSIS — R5381 Other malaise: Secondary | ICD-10-CM

## 2010-10-13 DIAGNOSIS — Q619 Cystic kidney disease, unspecified: Secondary | ICD-10-CM

## 2010-10-13 DIAGNOSIS — F3289 Other specified depressive episodes: Secondary | ICD-10-CM

## 2010-10-13 DIAGNOSIS — M949 Disorder of cartilage, unspecified: Secondary | ICD-10-CM

## 2010-10-13 DIAGNOSIS — F329 Major depressive disorder, single episode, unspecified: Secondary | ICD-10-CM

## 2010-10-13 LAB — BASIC METABOLIC PANEL
BUN: 11 mg/dL (ref 6–23)
CO2: 31 mEq/L (ref 19–32)
Calcium: 9.2 mg/dL (ref 8.4–10.5)
Chloride: 98 mEq/L (ref 96–112)
Creat: 0.63 mg/dL (ref 0.50–1.10)
Glucose, Bld: 95 mg/dL (ref 70–99)
Potassium: 4.3 mEq/L (ref 3.5–5.3)
Sodium: 139 mEq/L (ref 135–145)

## 2010-10-13 MED ORDER — PAROXETINE HCL 10 MG PO TABS: 10.0000 mg | ORAL_TABLET | Freq: Every day | ORAL | Status: DC

## 2010-10-13 NOTE — Patient Instructions (Addendum)
F/u mid November  HBA1C, cmp and egfr, TSH and Vit D today.Copy to Dr.moryati. We will call with results  Fasting lipid, hepatic and chem 7 and HBA1C mid November.  You are referred for a carotid doppler, and will be referred for renal US in November  Pls do start regular exercise at the [facility] as tolerated.  Pls start to schedule small tasks around the house on a daily basis.  Pls do resume paroxetine and consider going to therapy again

## 2010-10-13 NOTE — Progress Notes (Signed)
  Subjective:    Patient ID: Jennifer Blackburn, female    DOB: 1946/02/17, 65 y.o.   MRN: [id number]  HPI Pt in with a major c/o generalized fatigue. She has been recovering well from recent hip surgery, however notes less "get up and go" as well as easy exhaustion. She denies any fever, chills , head or chest congestion. She denies dysuria or frequency. She denies uncontrolled depression or anxiety. Recently has had increased family concerns/stresses, and acknoledges that she may well benefit from therapy. Tests blood sugars daily, and reports fasting sugars to range from 110 to 130   Review of Systems Denies recent fever or chills. Denies sinus pressure, nasal congestion, ear pain or sore throat. Denies chest congestion, productive cough or wheezing. Denies chest pains, palpitations and leg swelling Denies abdominal pain, nausea, vomiting,diarrhea or constipation.   Denies dysuria, frequency, hesitancy or incontinence.  Denies headaches, seizures, numbness, or tingling.  Denies skin break down or rash.        Objective:   Physical Exam Patient alert and oriented and in no cardiopulmonary distress.  HEENT: No facial asymmetry, EOMI, no sinus tenderness,  oropharynx pink and moist.  Neck supple no adenopathy.  Chest: Clear to auscultation bilaterally.  CVS: S1, S2 no murmurs, no S3.  ABD: Soft non tender. Bowel sounds normal.  Ext: No edema  MS: Adequate ROM spine, shoulders,  and knees.Decreased in left hip Skin: Intact, no ulcerations or rash noted.  Psych: Good eye contact, normal affect. Memory intact not anxious or depressed appearing.  CNS: CN 2-12 intact, power, tone and sensation normal throughout.       Assessment & Plan:

## 2010-10-14 DIAGNOSIS — E559 Vitamin D deficiency, unspecified: Secondary | ICD-10-CM | POA: Insufficient documentation

## 2010-10-14 LAB — CBC WITH DIFFERENTIAL/PLATELET
Basophils Absolute: 0 10*3/uL (ref 0.0–0.1)
Basophils Relative: 0 % (ref 0–1)
Eosinophils Absolute: 0.3 10*3/uL (ref 0.0–0.7)
Eosinophils Relative: 5 % (ref 0–5)
HCT: 36.8 % (ref 36.0–46.0)
Hemoglobin: 11.3 g/dL — ABNORMAL LOW (ref 12.0–15.0)
Lymphocytes Relative: 37 % (ref 12–46)
Lymphs Abs: 2.8 10*3/uL (ref 0.7–4.0)
MCH: 25.3 pg — ABNORMAL LOW (ref 26.0–34.0)
MCHC: 30.7 g/dL (ref 30.0–36.0)
MCV: 82.3 fL (ref 78.0–100.0)
Monocytes Absolute: 0.4 10*3/uL (ref 0.1–1.0)
Monocytes Relative: 6 % (ref 3–12)
Neutro Abs: 3.9 10*3/uL (ref 1.7–7.7)
Neutrophils Relative %: 52 % (ref 43–77)
Platelets: 307 10*3/uL (ref 150–400)
RBC: 4.47 MIL/uL (ref 3.87–5.11)
RDW: 15 % (ref 11.5–15.5)
WBC: 7.5 10*3/uL (ref 4.0–10.5)

## 2010-10-14 LAB — HEMOGLOBIN A1C
Hgb A1c MFr Bld: 7.2 % — ABNORMAL HIGH (ref <5.7)
Mean Plasma Glucose: 160 mg/dL — ABNORMAL HIGH (ref <117)

## 2010-10-14 LAB — VITAMIN D 25 HYDROXY (VIT D DEFICIENCY, FRACTURES): Vit D, 25-Hydroxy: 16 ng/mL — ABNORMAL LOW (ref 30–89)

## 2010-10-14 LAB — TSH: TSH: 0.07 u[IU]/mL — ABNORMAL LOW (ref 0.350–4.500)

## 2010-10-14 MED ORDER — VITAMIN D3 1.25 MG (50000 UT) PO CAPS: 50000.0000 [IU] | ORAL_CAPSULE | ORAL | Status: DC

## 2010-10-15 ENCOUNTER — Ambulatory Visit (HOSPITAL_COMMUNITY)
Admission: RE | Admit: 2010-10-15 | Discharge: 2010-10-15 | Disposition: A | Discharge status: Home / Self Care | Payer: Medicare Other | Source: Ambulatory Visit | Attending: Family Medicine | Admitting: Family Medicine

## 2010-10-15 DIAGNOSIS — I10 Essential (primary) hypertension: Secondary | ICD-10-CM

## 2010-10-15 DIAGNOSIS — N289 Disorder of kidney and ureter, unspecified: Secondary | ICD-10-CM | POA: Insufficient documentation

## 2010-10-15 DIAGNOSIS — R0989 Other specified symptoms and signs involving the circulatory and respiratory systems: Secondary | ICD-10-CM

## 2010-10-15 DIAGNOSIS — E119 Type 2 diabetes mellitus without complications: Secondary | ICD-10-CM | POA: Insufficient documentation

## 2010-10-15 DIAGNOSIS — N281 Cyst of kidney, acquired: Secondary | ICD-10-CM

## 2010-10-21 ENCOUNTER — Telehealth: Payer: Self-pay | Admitting: Family Medicine

## 2010-10-21 NOTE — Telephone Encounter (Signed)
Sinus pressure and headache, fatigue, eyes crusty in the mornings, no fever, no chills or body aches, no cough, patient has been taking claritin d with no relief

## 2010-10-21 NOTE — Telephone Encounter (Signed)
Patient aware.

## 2010-10-21 NOTE — Telephone Encounter (Signed)
We are unable to send in any medications without her being seen. She can schedule a work-in with me this week if needed.

## 2010-10-21 NOTE — Telephone Encounter (Signed)
Called patient, left message.

## 2010-11-01 ENCOUNTER — Encounter: Payer: Self-pay | Admitting: Family Medicine

## 2010-11-01 NOTE — Assessment & Plan Note (Signed)
Deteriorated, will check tsh and fwd result to endocrinologist in case this has anything to do with her increased fatigue

## 2010-11-01 NOTE — Assessment & Plan Note (Signed)
Controlled, followed by cardiology, will check in 3 months

## 2010-11-01 NOTE — Assessment & Plan Note (Signed)
Improved, no med changes made

## 2010-11-01 NOTE — Assessment & Plan Note (Signed)
Uncontrolled, pt to resume paxil and consider returning to therapy

## 2010-11-01 NOTE — Assessment & Plan Note (Signed)
Controlled, no change in medication  

## 2010-11-14 ENCOUNTER — Other Ambulatory Visit: Payer: Self-pay | Admitting: Family Medicine

## 2010-11-27 ENCOUNTER — Telehealth: Payer: Self-pay | Admitting: Family Medicine

## 2010-11-27 MED ORDER — GLIPIZIDE 10 MG PO TABS: 10.0000 mg | ORAL_TABLET | Freq: Two times a day (BID) | ORAL | Status: DC

## 2010-11-27 NOTE — Telephone Encounter (Signed)
REFILLED AS REQUESTED ?

## 2010-12-10 ENCOUNTER — Other Ambulatory Visit: Payer: Self-pay | Admitting: Family Medicine

## 2010-12-13 ENCOUNTER — Other Ambulatory Visit: Payer: Self-pay | Admitting: Family Medicine

## 2010-12-14 ENCOUNTER — Other Ambulatory Visit: Payer: Self-pay | Admitting: Family Medicine

## 2010-12-18 ENCOUNTER — Encounter: Payer: Self-pay | Admitting: Family Medicine

## 2010-12-21 ENCOUNTER — Encounter: Payer: Self-pay | Admitting: Family Medicine

## 2010-12-21 ENCOUNTER — Ambulatory Visit (INDEPENDENT_AMBULATORY_CARE_PROVIDER_SITE_OTHER): Payer: Medicare Other | Admitting: Family Medicine

## 2010-12-21 VITALS — BP 110/66 | HR 74 | Resp 16 | Ht 64.5 in | Wt 198.0 lb

## 2010-12-21 DIAGNOSIS — D649 Anemia, unspecified: Secondary | ICD-10-CM

## 2010-12-21 DIAGNOSIS — R5383 Other fatigue: Secondary | ICD-10-CM

## 2010-12-21 DIAGNOSIS — I10 Essential (primary) hypertension: Secondary | ICD-10-CM

## 2010-12-21 DIAGNOSIS — E785 Hyperlipidemia, unspecified: Secondary | ICD-10-CM

## 2010-12-21 DIAGNOSIS — E119 Type 2 diabetes mellitus without complications: Secondary | ICD-10-CM

## 2010-12-21 DIAGNOSIS — J019 Acute sinusitis, unspecified: Secondary | ICD-10-CM

## 2010-12-21 DIAGNOSIS — F329 Major depressive disorder, single episode, unspecified: Secondary | ICD-10-CM

## 2010-12-21 DIAGNOSIS — R5381 Other malaise: Secondary | ICD-10-CM

## 2010-12-21 DIAGNOSIS — Z23 Encounter for immunization: Secondary | ICD-10-CM

## 2010-12-21 MED ORDER — AZITHROMYCIN 250 MG PO TABS: ORAL_TABLET | ORAL | Status: AC

## 2010-12-21 NOTE — Patient Instructions (Signed)
F/u in 3.5 months.  TDAP today, nurse visit for flu vaccine in  to 3 weeks  Pls start 1 multivitamin ONCE daily   Fasting labs in 3.5 months. CBC and anemia panel, lipid , cmp and egfr, HBA1C  In 3.5 months  You are being treated for sinusitis

## 2010-12-22 LAB — MICROALBUMIN / CREATININE URINE RATIO
Creatinine, Urine: 136 mg/dL
Microalb Creat Ratio: 3.7 mg/g (ref 0.0–30.0)
Microalb, Ur: 0.5 mg/dL (ref 0.00–1.89)

## 2010-12-22 NOTE — Progress Notes (Signed)
  Subjective:    Patient ID: Jennifer Blackburn, female    DOB: July 06, 1945, 65 y.o.   MRN: [id number]  HPI 1 week h/o head congestion and pressure over forehead and cheeks. Intermittent chills and yellow nasal drainage.No fever or productive cough. Increased malaise. Recently saw endocrine, has a 1 year f/u , sttates she was advised may need vit B will bring back labwork. Reports improved HBA1C to 7.5 Tests fasting sugars which are ranging between 130 and140   Review of Systems See HPI Denies chest pains, palpitations and leg swelling Denies abdominal pain, nausea, vomiting,diarrhea or constipation.   Denies dysuria, frequency, hesitancy or incontinence. Denies joint pain, swelling and limitation in mobility. Denies headaches, seizures, numbness, or tingling. Increased depression and  anxiety due to amily issues, sleep is fair. Not suicidal or homicidal. Intends to return to therapy. Denies skin break down or rash.        Objective:   Physical Exam Patient alert and oriented and in no cardiopulmonary distress.  HEENT: No facial asymmetry, EOMI, frontal and maxillary  sinus tenderness,  oropharynx pink and moist.  Neck supple anterior cervical adenitis. Chest: Clear to auscultation bilaterally.  CVS: S1, S2 no murmurs, no S3.  ABD: Soft non tender. Bowel sounds normal.  Ext: No edema  MS: Adequate ROM spine, shoulders, hips and knees.  Skin: Intact, no ulcerations or rash noted.  Psych: Good eye contact, normal affect. Memory intact not anxious or depressed appearing.  CNS: CN 2-12 intact, power, tone and sensation normal throughout.        Assessment & Plan:

## 2010-12-22 NOTE — Assessment & Plan Note (Signed)
Increased personal stress plans to return to counseling and needs to

## 2010-12-22 NOTE — Assessment & Plan Note (Signed)
Improved control most recent HBA1C per pt from endo's labwork is 7.5. I advise dose increase in lantus and closer attention to diet

## 2010-12-22 NOTE — Assessment & Plan Note (Signed)
Acute infection, zpack prescribed 

## 2010-12-22 NOTE — Assessment & Plan Note (Signed)
Controlled, no change in medication  

## 2010-12-22 NOTE — Assessment & Plan Note (Signed)
Hyperlipidemia:Low fat diet discussed and encouraged.  Labs in 3 months

## 2011-01-22 ENCOUNTER — Ambulatory Visit (INDEPENDENT_AMBULATORY_CARE_PROVIDER_SITE_OTHER): Payer: Medicare Other | Admitting: Family Medicine

## 2011-01-22 ENCOUNTER — Encounter (HOSPITAL_COMMUNITY): Payer: Self-pay

## 2011-01-22 ENCOUNTER — Ambulatory Visit (HOSPITAL_COMMUNITY)
Admission: RE | Admit: 2011-01-22 | Discharge: 2011-01-22 | Disposition: A | Discharge status: Home / Self Care | Payer: Medicare Other | Source: Ambulatory Visit | Attending: Family Medicine | Admitting: Family Medicine

## 2011-01-22 ENCOUNTER — Encounter: Payer: Self-pay | Admitting: Family Medicine

## 2011-01-22 ENCOUNTER — Telehealth: Payer: Self-pay | Admitting: Family Medicine

## 2011-01-22 VITALS — BP 108/60 | HR 79 | Resp 16 | Ht 65.0 in | Wt 199.1 lb

## 2011-01-22 DIAGNOSIS — M542 Cervicalgia: Secondary | ICD-10-CM

## 2011-01-22 DIAGNOSIS — M25569 Pain in unspecified knee: Secondary | ICD-10-CM

## 2011-01-22 DIAGNOSIS — J323 Chronic sphenoidal sinusitis: Secondary | ICD-10-CM | POA: Insufficient documentation

## 2011-01-22 DIAGNOSIS — S0990XA Unspecified injury of head, initial encounter: Secondary | ICD-10-CM | POA: Insufficient documentation

## 2011-01-22 DIAGNOSIS — R55 Syncope and collapse: Secondary | ICD-10-CM | POA: Insufficient documentation

## 2011-01-22 DIAGNOSIS — R51 Headache: Secondary | ICD-10-CM | POA: Insufficient documentation

## 2011-01-22 MED ORDER — OXYCODONE-ACETAMINOPHEN 5-325 MG PO TABS: 1.0000 | ORAL_TABLET | ORAL | Status: DC | PRN

## 2011-01-22 MED ORDER — CYCLOBENZAPRINE HCL 10 MG PO TABS: 10.0000 mg | ORAL_TABLET | Freq: Every evening | ORAL | Status: AC | PRN

## 2011-01-22 NOTE — Telephone Encounter (Signed)
Patient given CT scan results. This is likely post postconcussive symptoms. I explained red flags for her which she will deliver to her husband. If she has worsening headache, nausea vomiting, change in vision, dizziness, change in mentation she should seek care at the ER. Patient voiced understanding

## 2011-01-22 NOTE — Patient Instructions (Signed)
For your neck you have a spasm, I have prescribed a muscle relaxant for bedtime. You can continue the aspercream Use the pain pill for your knee and headache I have ordered a scan of your head. If you get worse over the weekend then go to the ER You can hold on the HCTZ for now. Your blood pressure looks good.

## 2011-01-22 NOTE — Progress Notes (Addendum)
  Subjective:    Patient ID: Jennifer Blackburn, female    DOB: 02/18/1946, 65 y.o.   MRN: [id number]  HPI  Saturday night pt was sick- had abdominal pain, Nausea, no emesis, had hotdogs that day. When she was up to the restroom felt dizzy and sick with a lot of wretching and fell, hitting head on the door. Has had neck pain since the fall and daily headache for the past few days. Still feels foggy and not like herself Also had right knee pain  Headache - feels like head is sore, +syncopy- per above, does not remember passing out, no seizure activity, used her husbands Vicodin Knee- +swelling over the weekend now resolved, but continued soreness- though much improved  Not taking HCTZ- past week     Review of Systems  GEN- denies fatigue, fever, weight loss,weakness, recent illness HEENT- denies eye drainage, change in vision, nasal discharge, CVS- denies chest pain, palpitations RESP- denies SOB, cough, wheeze ABD- denies N/V, change in stools, abd pain MSK- + joint pain, +muscle aches, injury Neuro- per above      Objective:   Physical Exam GEN- NAD, alert and oriented x3 HEENT- PERRL, EOMI, fundoscopic exam benign, MMM, oropharynx clear, no abrasion to head Neck- +spasm of trapeizius L>R, TTP, slow/stiff ROM CVS- RRR, no murmur RESP-CTAB EXT- No edema Pulses- Radial, DP- 2+ Spine- non tender Knee- Bilat normal ROM, +crepitus, no effusion noted, right knee, TTP over medial aspect of knee Neuro- normal mentation, no focal deficits, CNII-XII grossly in tact  ABD- NABS, soft, NT,ND      Assessment & Plan:

## 2011-01-24 ENCOUNTER — Encounter: Payer: Self-pay | Admitting: Family Medicine

## 2011-01-24 DIAGNOSIS — M25561 Pain in right knee: Secondary | ICD-10-CM | POA: Insufficient documentation

## 2011-01-24 DIAGNOSIS — M25569 Pain in unspecified knee: Secondary | ICD-10-CM | POA: Insufficient documentation

## 2011-01-24 DIAGNOSIS — M542 Cervicalgia: Secondary | ICD-10-CM | POA: Insufficient documentation

## 2011-01-24 NOTE — Assessment & Plan Note (Addendum)
Concern for concussive symptoms s/p headache, with continous pain, will obtain CT head today to r/u acute bleed. unwitness fall, pt explains likley syncopal event with Valsalva as she was trying to get to restroom while wretching. Pt given red flags to seek care this weekend  Note- CT Head negative

## 2011-01-24 NOTE — Assessment & Plan Note (Signed)
Trial of muscle relaxant at bedtime, percocet given for pain.

## 2011-01-24 NOTE — Assessment & Plan Note (Signed)
S/p fall, history of chronic knee pain, no effusion today or swelling. Improved already, prn pain meds

## 2011-01-26 ENCOUNTER — Telehealth: Payer: Self-pay | Admitting: Family Medicine

## 2011-01-27 ENCOUNTER — Other Ambulatory Visit: Payer: Self-pay | Admitting: Family Medicine

## 2011-01-27 MED ORDER — DIAZEPAM 5 MG PO TABS: ORAL_TABLET | ORAL | Status: AC

## 2011-01-27 NOTE — Telephone Encounter (Signed)
pls let pt know valium is sent in place of flexeril, I tried to call but no response

## 2011-01-27 NOTE — Telephone Encounter (Signed)
Is getting no relief from the flexeril for spasms. Wants it increased or something else sent in its place

## 2011-02-05 NOTE — Telephone Encounter (Signed)
Left message about the medicine

## 2011-02-08 ENCOUNTER — Encounter: Payer: Self-pay | Admitting: Family Medicine

## 2011-02-16 ENCOUNTER — Ambulatory Visit: Payer: Medicare Other | Admitting: Family Medicine

## 2011-02-26 ENCOUNTER — Other Ambulatory Visit: Payer: Self-pay | Admitting: Family Medicine

## 2011-03-08 ENCOUNTER — Telehealth: Payer: Self-pay | Admitting: Family Medicine

## 2011-03-09 ENCOUNTER — Encounter: Payer: Self-pay | Admitting: Family Medicine

## 2011-03-09 ENCOUNTER — Ambulatory Visit (INDEPENDENT_AMBULATORY_CARE_PROVIDER_SITE_OTHER): Payer: Medicare Other | Admitting: Family Medicine

## 2011-03-09 VITALS — BP 128/70 | HR 71 | Temp 99.1°F | Resp 16 | Ht 65.0 in | Wt 202.1 lb

## 2011-03-09 DIAGNOSIS — I10 Essential (primary) hypertension: Secondary | ICD-10-CM

## 2011-03-09 DIAGNOSIS — J309 Allergic rhinitis, unspecified: Secondary | ICD-10-CM | POA: Insufficient documentation

## 2011-03-09 DIAGNOSIS — J40 Bronchitis, not specified as acute or chronic: Secondary | ICD-10-CM

## 2011-03-09 DIAGNOSIS — E119 Type 2 diabetes mellitus without complications: Secondary | ICD-10-CM

## 2011-03-09 MED ORDER — FLUTICASONE PROPIONATE 50 MCG/ACT NA SUSP: 2.0000 | Freq: Every day | NASAL | Status: DC

## 2011-03-09 MED ORDER — PENICILLIN V POTASSIUM 500 MG PO TABS: 500.0000 mg | ORAL_TABLET | Freq: Three times a day (TID) | ORAL | Status: AC

## 2011-03-09 MED ORDER — CEFTRIAXONE SODIUM 1 G IJ SOLR
1.0000 g | Freq: Once | INTRAMUSCULAR | Status: AC
  Administered 2011-03-09: 1 g via INTRAMUSCULAR

## 2011-03-09 MED ORDER — CHLORPHENIRAMINE-HYDROCODONE 8-10 MG/5ML PO LQCR: 5.0000 mL | Freq: Two times a day (BID) | ORAL | Status: DC | PRN

## 2011-03-09 MED ORDER — CEFTRIAXONE SODIUM 1 G IJ SOLR: 1.0000 g | Freq: Once | INTRAMUSCULAR | Status: DC

## 2011-03-09 NOTE — Telephone Encounter (Signed)
2 appts need to be cancelled for Wednesday, since pt's were recently seen pls put pt in one of slots

## 2011-03-09 NOTE — Assessment & Plan Note (Signed)
Penicillin prescribed tablets prescribed and rocephin administered

## 2011-03-09 NOTE — Patient Instructions (Signed)
F/u As before .  You are being treated for bronchitis and uncontrolled sinus allergies.  Rocephin is given and penicillin is prescribed.  pls start flonase spray daily for allergies, prescribed.cough suppressant prescribed.   Continue daily claritin or claritin d   pls start saline nasal flushes 2 to 3 times daily, this is oTC ocean spray/netty pot   Use tussin diabetic to be used per instructions on bottle

## 2011-03-10 NOTE — Assessment & Plan Note (Signed)
Uncontrolled, pt to start flonase and saline flushes

## 2011-03-10 NOTE — Assessment & Plan Note (Signed)
Controlled, no change in medication  

## 2011-03-10 NOTE — Assessment & Plan Note (Signed)
Low carb diet and regular testing with med adherence stressed will f/u on lab in Jan

## 2011-03-10 NOTE — Progress Notes (Signed)
Subjective:     Patient ID: Jennifer Blackburn, female   DOB: [birth year], 65 y.o.   MRN: [id number]  HPI 4 day h/o increased head and chest congestion, cough with sputum which is thick, sinus pressure and post nasal drainage. Intermittent chills, no documented fever. Denies ear pain or sore throat. Tests blood sugars daily, denies polyuria, polydipsia, blurred vision or hypoglycemic episodes, reports improved blood sugars  Review of Systems See HPI  Denies chest pains, palpitations and leg swelling Denies abdominal pain, nausea, vomiting,diarrhea or constipation.   Denies dysuria, frequency, hesitancy or incontinence. Denies joint pain, swelling and limitation in mobility. Denies headaches, seizures, numbness, or tingling. Denies depression, anxiety or insomnia. Denies skin break down or rash.        Objective:   Physical Exam Patient alert and oriented and in no cardiopulmonary distress.  HEENT: No facial asymmetry, EOMI, no sinus tenderness,  oropharynx pink and moist.  Neck supple no adenopathy.Erythema and edema of nasal mucosa  Chest: Decreased air entry scattered crackles, no wheezes CVS: S1, S2 no murmurs, no S3.  ABD: Soft non tender. Bowel sounds normal.  Ext: No edema  MS: Adequate ROM spine, shoulders, hips and knees.  Skin: Intact, no ulcerations or rash noted.  Psych: Good eye contact, normal affect. Memory intact not anxious or depressed appearing.  CNS: CN 2-12 intact, power, tone and sensation normal throughout.     Assessment:         Plan:

## 2011-03-11 ENCOUNTER — Telehealth: Payer: Self-pay | Admitting: Family Medicine

## 2011-03-12 NOTE — Telephone Encounter (Signed)
Patent needed her cough med called in

## 2011-03-24 ENCOUNTER — Telehealth: Payer: Self-pay | Admitting: Family Medicine

## 2011-03-25 ENCOUNTER — Other Ambulatory Visit: Payer: Self-pay | Admitting: Family Medicine

## 2011-03-25 ENCOUNTER — Ambulatory Visit (HOSPITAL_COMMUNITY)
Admission: RE | Admit: 2011-03-25 | Discharge: 2011-03-25 | Disposition: A | Discharge status: Home / Self Care | Payer: Medicare Other | Source: Ambulatory Visit | Attending: Family Medicine | Admitting: Family Medicine

## 2011-03-25 ENCOUNTER — Other Ambulatory Visit: Payer: Self-pay

## 2011-03-25 DIAGNOSIS — R05 Cough: Secondary | ICD-10-CM | POA: Diagnosis not present

## 2011-03-25 DIAGNOSIS — R059 Cough, unspecified: Secondary | ICD-10-CM | POA: Diagnosis not present

## 2011-03-25 MED ORDER — BECLOMETHASONE DIPROPIONATE 40 MCG/ACT IN AERS: 2.0000 | INHALATION_SPRAY | Freq: Two times a day (BID) | RESPIRATORY_TRACT | Status: DC

## 2011-03-25 NOTE — Telephone Encounter (Signed)
Can fet work in next week some time .  I suggest CXR today with chronic cough and addition of an inhaler qvar since I believe this is allergy based not infection. Will eneter the nmed, fax after you spk with her . The cxr will let me know if any infection in the lung  Should get that today.  When she coughs less her headache should improve due to better sleep   Offer toradol 60mg  Im for headache if she wishe , can get by 2pm as nurse visit

## 2011-03-25 NOTE — Telephone Encounter (Signed)
Spoke with pt and let her know of the new orders for cxr and the inhaler.  She declined the shot of Tordal.  And she said that she has an appointment on mon 03/29/2011 with dr. Jeanice Lim.

## 2011-03-29 ENCOUNTER — Ambulatory Visit: Payer: Medicare Other | Admitting: Family Medicine

## 2011-03-29 ENCOUNTER — Encounter: Payer: Self-pay | Admitting: Family Medicine

## 2011-03-30 ENCOUNTER — Ambulatory Visit (INDEPENDENT_AMBULATORY_CARE_PROVIDER_SITE_OTHER): Payer: Medicare Other | Admitting: Family Medicine

## 2011-03-30 ENCOUNTER — Encounter: Payer: Self-pay | Admitting: Family Medicine

## 2011-03-30 VITALS — BP 122/80 | HR 95 | Temp 98.7°F | Resp 16 | Ht 65.0 in | Wt 200.1 lb

## 2011-03-30 DIAGNOSIS — D649 Anemia, unspecified: Secondary | ICD-10-CM | POA: Insufficient documentation

## 2011-03-30 DIAGNOSIS — J40 Bronchitis, not specified as acute or chronic: Secondary | ICD-10-CM

## 2011-03-30 DIAGNOSIS — J309 Allergic rhinitis, unspecified: Secondary | ICD-10-CM

## 2011-03-30 DIAGNOSIS — Z23 Encounter for immunization: Secondary | ICD-10-CM | POA: Diagnosis not present

## 2011-03-30 LAB — CBC WITH DIFFERENTIAL/PLATELET
Basophils Absolute: 0.1 10*3/uL (ref 0.0–0.1)
Basophils Relative: 1 % (ref 0–1)
Eosinophils Absolute: 0.1 10*3/uL (ref 0.0–0.7)
Eosinophils Relative: 2 % (ref 0–5)
HCT: 36.4 % (ref 36.0–46.0)
Hemoglobin: 11.2 g/dL — ABNORMAL LOW (ref 12.0–15.0)
Lymphocytes Relative: 40 % (ref 12–46)
Lymphs Abs: 2.5 10*3/uL (ref 0.7–4.0)
MCH: 26.5 pg (ref 26.0–34.0)
MCHC: 30.8 g/dL (ref 30.0–36.0)
MCV: 86.3 fL (ref 78.0–100.0)
Monocytes Absolute: 0.4 10*3/uL (ref 0.1–1.0)
Monocytes Relative: 6 % (ref 3–12)
Neutro Abs: 3.2 10*3/uL (ref 1.7–7.7)
Neutrophils Relative %: 51 % (ref 43–77)
Platelets: 298 10*3/uL (ref 150–400)
RBC: 4.22 MIL/uL (ref 3.87–5.11)
RDW: 14.3 % (ref 11.5–15.5)
WBC: 6.4 10*3/uL (ref 4.0–10.5)

## 2011-03-30 MED ORDER — BENZONATATE 100 MG PO CAPS: 100.0000 mg | ORAL_CAPSULE | Freq: Three times a day (TID) | ORAL | Status: AC | PRN

## 2011-03-30 NOTE — Progress Notes (Signed)
  Subjective:    Patient ID: Jennifer Blackburn, female    DOB: 14-Jul-1945, 66 y.o.   MRN: [id number]  HPI  persistant cough Feels better overall Using QVAR once a day, claritin, diabetic tussin , completed course of antibiotics and stopped flonase  S/p CXR  ROS- No recent fever, +rhinorrhea, +fatigue, +,HA with cough and sinus pressure, no change in vision, no SOB, no CP CBG running 120's   Anemia- would like hb checked, has been fatigued since hip surgery, required transfusion in the past  Review of Systems - per above       Objective:   Physical Exam GEN- NAD, alert and oriented x3 HEENT- PERRL, EOMI, non injected sclera, pink conjunctiva, MMM, mild injected oropharynx, TM clear bilat, turbinates enlarged, mild erythema Neck- Supple, no LAD CVS- RRR, no murmur RESP-CTAB EXT- No edema Pulses- Radial, DP- 2+        Assessment & Plan:

## 2011-03-30 NOTE — Patient Instructions (Signed)
Your cough will improve over the next few weeks Try a humidifier and tessalon perrles for cough If you have fever or difficulty breathing please call for instructions Your Chest X-ray was normal. Use the nasal saline You can continue the QVAR for the next couple of weeks F/U as previously scheduled with Dr. Lodema Hong

## 2011-03-31 NOTE — Assessment & Plan Note (Signed)
Check CBC 

## 2011-03-31 NOTE — Assessment & Plan Note (Addendum)
Pt with post infectious cough, no further antibiotics needed, tessalon perrles, QVAR for the next few weeks Given red flags

## 2011-03-31 NOTE — Assessment & Plan Note (Signed)
S/p treatment. Nasal saline

## 2011-04-20 ENCOUNTER — Other Ambulatory Visit: Payer: Self-pay | Admitting: Family Medicine

## 2011-04-20 ENCOUNTER — Ambulatory Visit: Payer: Medicare Other | Admitting: Family Medicine

## 2011-05-10 ENCOUNTER — Other Ambulatory Visit: Payer: Self-pay | Admitting: Family Medicine

## 2011-05-12 DIAGNOSIS — E119 Type 2 diabetes mellitus without complications: Secondary | ICD-10-CM | POA: Diagnosis not present

## 2011-05-12 DIAGNOSIS — I10 Essential (primary) hypertension: Secondary | ICD-10-CM | POA: Diagnosis not present

## 2011-05-12 DIAGNOSIS — E782 Mixed hyperlipidemia: Secondary | ICD-10-CM | POA: Diagnosis not present

## 2011-05-14 DIAGNOSIS — H5231 Anisometropia: Secondary | ICD-10-CM | POA: Diagnosis not present

## 2011-05-14 DIAGNOSIS — H35349 Macular cyst, hole, or pseudohole, unspecified eye: Secondary | ICD-10-CM | POA: Diagnosis not present

## 2011-05-14 DIAGNOSIS — H52229 Regular astigmatism, unspecified eye: Secondary | ICD-10-CM | POA: Diagnosis not present

## 2011-05-18 DIAGNOSIS — H2510 Age-related nuclear cataract, unspecified eye: Secondary | ICD-10-CM | POA: Diagnosis not present

## 2011-05-18 DIAGNOSIS — H35349 Macular cyst, hole, or pseudohole, unspecified eye: Secondary | ICD-10-CM | POA: Diagnosis not present

## 2011-05-18 DIAGNOSIS — H43819 Vitreous degeneration, unspecified eye: Secondary | ICD-10-CM | POA: Diagnosis not present

## 2011-06-07 ENCOUNTER — Other Ambulatory Visit: Payer: Self-pay | Admitting: Family Medicine

## 2011-06-09 ENCOUNTER — Other Ambulatory Visit: Payer: Self-pay | Admitting: Family Medicine

## 2011-07-08 DIAGNOSIS — H35349 Macular cyst, hole, or pseudohole, unspecified eye: Secondary | ICD-10-CM | POA: Diagnosis not present

## 2011-07-09 DIAGNOSIS — H35349 Macular cyst, hole, or pseudohole, unspecified eye: Secondary | ICD-10-CM | POA: Diagnosis not present

## 2011-07-09 DIAGNOSIS — Z09 Encounter for follow-up examination after completed treatment for conditions other than malignant neoplasm: Secondary | ICD-10-CM | POA: Diagnosis not present

## 2011-08-03 DIAGNOSIS — M169 Osteoarthritis of hip, unspecified: Secondary | ICD-10-CM | POA: Diagnosis not present

## 2011-08-06 DIAGNOSIS — H35349 Macular cyst, hole, or pseudohole, unspecified eye: Secondary | ICD-10-CM | POA: Diagnosis not present

## 2011-08-30 DIAGNOSIS — L680 Hirsutism: Secondary | ICD-10-CM | POA: Diagnosis not present

## 2011-08-30 DIAGNOSIS — L738 Other specified follicular disorders: Secondary | ICD-10-CM | POA: Diagnosis not present

## 2011-09-29 ENCOUNTER — Encounter (HOSPITAL_COMMUNITY): Payer: Self-pay | Admitting: Psychiatry

## 2011-09-29 ENCOUNTER — Ambulatory Visit (INDEPENDENT_AMBULATORY_CARE_PROVIDER_SITE_OTHER): Payer: Medicare Other | Admitting: Psychiatry

## 2011-09-29 DIAGNOSIS — F419 Anxiety disorder, unspecified: Secondary | ICD-10-CM

## 2011-09-29 DIAGNOSIS — F411 Generalized anxiety disorder: Secondary | ICD-10-CM | POA: Diagnosis not present

## 2011-10-01 NOTE — Patient Instructions (Addendum)
Discussed orally 
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
## 2011-10-11 ENCOUNTER — Ambulatory Visit: Payer: Medicare Other | Admitting: Family Medicine

## 2011-10-14 ENCOUNTER — Ambulatory Visit (INDEPENDENT_AMBULATORY_CARE_PROVIDER_SITE_OTHER): Payer: Medicare Other | Admitting: Family Medicine

## 2011-10-14 ENCOUNTER — Encounter: Payer: Self-pay | Admitting: Family Medicine

## 2011-10-14 VITALS — BP 120/72 | HR 67 | Resp 18 | Ht 65.0 in | Wt 203.1 lb

## 2011-10-14 DIAGNOSIS — F341 Dysthymic disorder: Secondary | ICD-10-CM

## 2011-10-14 DIAGNOSIS — S0990XA Unspecified injury of head, initial encounter: Secondary | ICD-10-CM

## 2011-10-14 DIAGNOSIS — E785 Hyperlipidemia, unspecified: Secondary | ICD-10-CM

## 2011-10-14 DIAGNOSIS — F329 Major depressive disorder, single episode, unspecified: Secondary | ICD-10-CM | POA: Diagnosis not present

## 2011-10-14 DIAGNOSIS — E119 Type 2 diabetes mellitus without complications: Secondary | ICD-10-CM | POA: Diagnosis not present

## 2011-10-14 DIAGNOSIS — I10 Essential (primary) hypertension: Secondary | ICD-10-CM

## 2011-10-14 DIAGNOSIS — F32A Depression, unspecified: Secondary | ICD-10-CM

## 2011-10-14 DIAGNOSIS — F419 Anxiety disorder, unspecified: Secondary | ICD-10-CM

## 2011-10-14 DIAGNOSIS — E669 Obesity, unspecified: Secondary | ICD-10-CM

## 2011-10-14 DIAGNOSIS — E559 Vitamin D deficiency, unspecified: Secondary | ICD-10-CM

## 2011-10-14 MED ORDER — PAROXETINE HCL 10 MG PO TABS: 10.0000 mg | ORAL_TABLET | Freq: Every day | ORAL | Status: DC

## 2011-10-14 NOTE — Patient Instructions (Addendum)
Annual wellness in 2 month  Please start walking for 30 minutes every day, this will improve your health   Fasting lipid, cmp and EGFR, hBA1C , vit D and cbc today   You are being referred for a head scan due to recent trauma.  Yo will start paxil once more

## 2011-10-15 ENCOUNTER — Ambulatory Visit (HOSPITAL_COMMUNITY)
Admission: RE | Admit: 2011-10-15 | Discharge: 2011-10-15 | Disposition: A | Discharge status: Home / Self Care | Payer: Medicare Other | Source: Ambulatory Visit | Attending: Family Medicine | Admitting: Family Medicine

## 2011-10-15 ENCOUNTER — Ambulatory Visit (INDEPENDENT_AMBULATORY_CARE_PROVIDER_SITE_OTHER): Payer: Medicare Other | Admitting: Psychiatry

## 2011-10-15 DIAGNOSIS — I10 Essential (primary) hypertension: Secondary | ICD-10-CM | POA: Diagnosis not present

## 2011-10-15 DIAGNOSIS — F411 Generalized anxiety disorder: Secondary | ICD-10-CM

## 2011-10-15 DIAGNOSIS — Z043 Encounter for examination and observation following other accident: Secondary | ICD-10-CM | POA: Diagnosis not present

## 2011-10-15 DIAGNOSIS — F419 Anxiety disorder, unspecified: Secondary | ICD-10-CM

## 2011-10-15 DIAGNOSIS — R51 Headache: Secondary | ICD-10-CM | POA: Diagnosis not present

## 2011-10-15 DIAGNOSIS — S0990XA Unspecified injury of head, initial encounter: Secondary | ICD-10-CM | POA: Diagnosis not present

## 2011-10-15 DIAGNOSIS — E559 Vitamin D deficiency, unspecified: Secondary | ICD-10-CM | POA: Diagnosis not present

## 2011-10-15 DIAGNOSIS — W19XXXA Unspecified fall, initial encounter: Secondary | ICD-10-CM | POA: Insufficient documentation

## 2011-10-15 DIAGNOSIS — E119 Type 2 diabetes mellitus without complications: Secondary | ICD-10-CM | POA: Diagnosis not present

## 2011-10-15 LAB — CBC
HCT: 34.2 % — ABNORMAL LOW (ref 36.0–46.0)
Hemoglobin: 11.2 g/dL — ABNORMAL LOW (ref 12.0–15.0)
MCH: 26.4 pg (ref 26.0–34.0)
MCHC: 32.7 g/dL (ref 30.0–36.0)
MCV: 80.5 fL (ref 78.0–100.0)
Platelets: 290 10*3/uL (ref 150–400)
RBC: 4.25 MIL/uL (ref 3.87–5.11)
RDW: 14.6 % (ref 11.5–15.5)
WBC: 6.8 10*3/uL (ref 4.0–10.5)

## 2011-10-15 LAB — LIPID PANEL
Cholesterol: 235 mg/dL — ABNORMAL HIGH (ref 0–200)
HDL: 45 mg/dL (ref >39)
LDL Cholesterol: 161 mg/dL — ABNORMAL HIGH (ref 0–99)
Total CHOL/HDL Ratio: 5.2 Ratio
Triglycerides: 144 mg/dL (ref <150)
VLDL: 29 mg/dL (ref 0–40)

## 2011-10-15 LAB — COMPLETE METABOLIC PANEL WITH GFR
ALT: 20 U/L (ref 0–35)
AST: 23 U/L (ref 0–37)
Albumin: 4.2 g/dL (ref 3.5–5.2)
Alkaline Phosphatase: 46 U/L (ref 39–117)
BUN: 13 mg/dL (ref 6–23)
CO2: 28 mEq/L (ref 19–32)
Calcium: 9.1 mg/dL (ref 8.4–10.5)
Chloride: 103 mEq/L (ref 96–112)
Creat: 0.71 mg/dL (ref 0.50–1.10)
GFR, Est African American: >89 mL/min
GFR, Est Non African American: 89 mL/min
Glucose, Bld: 103 mg/dL — ABNORMAL HIGH (ref 70–99)
Potassium: 4.2 mEq/L (ref 3.5–5.3)
Sodium: 142 mEq/L (ref 135–145)
Total Bilirubin: 0.3 mg/dL (ref 0.3–1.2)
Total Protein: 7.1 g/dL (ref 6.0–8.3)

## 2011-10-15 LAB — HEMOGLOBIN A1C
Hgb A1c MFr Bld: 7.7 % — ABNORMAL HIGH (ref <5.7)
Mean Plasma Glucose: 174 mg/dL — ABNORMAL HIGH (ref <117)

## 2011-10-15 NOTE — Patient Instructions (Signed)
Discussed orally 
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
## 2011-10-16 DIAGNOSIS — F419 Anxiety disorder, unspecified: Secondary | ICD-10-CM | POA: Insufficient documentation

## 2011-10-16 DIAGNOSIS — F32A Depression, unspecified: Secondary | ICD-10-CM | POA: Insufficient documentation

## 2011-10-16 LAB — VITAMIN D 25 HYDROXY (VIT D DEFICIENCY, FRACTURES): Vit D, 25-Hydroxy: 22 ng/mL — ABNORMAL LOW (ref 30–89)

## 2011-10-16 NOTE — Assessment & Plan Note (Signed)
Deteriorated and uncontrolled, needs to follow low fat diet. Will verify /establish medication which pt is actually taking and adjust accordingly, I suspect non compliance

## 2011-10-16 NOTE — Assessment & Plan Note (Signed)
Updated lab data to be obtained

## 2011-10-16 NOTE — Assessment & Plan Note (Signed)
Anxiety and stress related to family issues, has resumed therapy, which is helpful, and has been advised of the need to resume paxil , which she has used in the past. Depression screen is negative, but based on  symptoms she is  experiencing will benefit from mediction
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
## 2011-10-16 NOTE — Assessment & Plan Note (Signed)
Controlled, no change in medication  

## 2011-10-16 NOTE — Assessment & Plan Note (Signed)
Uncontrolled, will need to increase lantus dose, also pt needs to commit to daily exercise

## 2011-10-16 NOTE — Assessment & Plan Note (Signed)
Deteriorated. Patient re-educated about  the importance of commitment to a  minimum of 150 minutes of exercise per week. The importance of healthy food choices with portion control discussed. Encouraged to start a food diary, count calories and to consider  joining a support group. Sample diet sheets offered. Goals set by the patient for the next several months.    

## 2011-10-18 ENCOUNTER — Other Ambulatory Visit: Payer: Self-pay | Admitting: Family Medicine

## 2011-10-18 ENCOUNTER — Other Ambulatory Visit: Payer: Self-pay

## 2011-10-18 MED ORDER — VITAMIN D3 1.25 MG (50000 UT) PO CAPS: 50000.0000 [IU] | ORAL_CAPSULE | ORAL | Status: DC

## 2011-10-18 MED ORDER — INSULIN GLARGINE 100 UNIT/ML ~~LOC~~ SOLN: 40.0000 [IU] | Freq: Every day | SUBCUTANEOUS | Status: DC

## 2011-10-29 ENCOUNTER — Ambulatory Visit (HOSPITAL_COMMUNITY): Payer: Self-pay | Admitting: Psychiatry

## 2011-11-04 ENCOUNTER — Telehealth: Payer: Self-pay | Admitting: Family Medicine

## 2011-11-04 DIAGNOSIS — E119 Type 2 diabetes mellitus without complications: Secondary | ICD-10-CM | POA: Diagnosis not present

## 2011-11-04 DIAGNOSIS — I10 Essential (primary) hypertension: Secondary | ICD-10-CM | POA: Diagnosis not present

## 2011-11-04 DIAGNOSIS — E782 Mixed hyperlipidemia: Secondary | ICD-10-CM | POA: Diagnosis not present

## 2011-11-04 MED ORDER — PRAVASTATIN SODIUM 80 MG PO TABS: 80.0000 mg | ORAL_TABLET | Freq: Every day | ORAL | Status: DC

## 2011-11-08 ENCOUNTER — Other Ambulatory Visit: Payer: Self-pay | Admitting: Cardiovascular Disease

## 2011-11-08 NOTE — Telephone Encounter (Signed)
Medicine has been send in

## 2011-11-08 NOTE — Telephone Encounter (Signed)
LMTCB pt pharmacy requesting verapamil refill. Pt has not been seen by Dr. Mariah Milling needs appointment.

## 2011-11-09 ENCOUNTER — Telehealth: Payer: Self-pay | Admitting: *Deleted

## 2011-11-09 NOTE — Telephone Encounter (Signed)
LMTCB x2 concerning refill request for verapamil.

## 2011-11-09 NOTE — Telephone Encounter (Signed)
Pt called to say she used to be a pt of Dr. Windell Hummingbird at [facility] and pharmacy accidentally sent [place] the refill request.  She was able to call pharmacy and have them fax request to [facility], where her cardiac care is managed.  She has meds now and does not need [place] to do anything. [facility] is managing.

## 2011-11-11 ENCOUNTER — Other Ambulatory Visit: Payer: Self-pay | Admitting: Family Medicine

## 2011-11-30 ENCOUNTER — Encounter: Payer: Self-pay | Admitting: Family Medicine

## 2011-11-30 ENCOUNTER — Ambulatory Visit (INDEPENDENT_AMBULATORY_CARE_PROVIDER_SITE_OTHER): Payer: Medicare Other | Admitting: Family Medicine

## 2011-11-30 VITALS — BP 114/70 | HR 74 | Resp 16 | Ht 65.0 in | Wt 201.0 lb

## 2011-11-30 DIAGNOSIS — I10 Essential (primary) hypertension: Secondary | ICD-10-CM

## 2011-11-30 DIAGNOSIS — E669 Obesity, unspecified: Secondary | ICD-10-CM

## 2011-11-30 DIAGNOSIS — E785 Hyperlipidemia, unspecified: Secondary | ICD-10-CM | POA: Diagnosis not present

## 2011-11-30 DIAGNOSIS — E119 Type 2 diabetes mellitus without complications: Secondary | ICD-10-CM | POA: Diagnosis not present

## 2011-11-30 DIAGNOSIS — M26649 Arthritis of unspecified temporomandibular joint: Secondary | ICD-10-CM

## 2011-11-30 DIAGNOSIS — M2669 Other specified disorders of temporomandibular joint: Secondary | ICD-10-CM

## 2011-11-30 NOTE — Patient Instructions (Addendum)
F/u early December, please cancel sooner appointment if any.  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  Ear exam is normal  Fasting lipid, cmp and EGFr and HBA1C end November please.  You have arthritis in your left knee, use topical rubs only.  Check on the shingles vaccine you need it!  It is important that you exercise regularly at least 30 minutes 5 times a week. If you develop chest pain, have severe difficulty breathing, or feel very tired, stop exercising immediately and seek medical attention  A healthy diet is rich in fruit, vegetables and whole grains. Poultry fish, nuts and beans are a healthy choice for protein rather then red meat. A low sodium diet and drinking 64 ounces of water daily is generally recommended. Oils and sweet should be limited. Carbohydrates especially for those who are diabetic or overweight, should be limited to 34-45 gram per meal. It is important to eat on a regular schedule, at least 3 times daily. Snacks should be primarily fruits, vegetables or nuts. Increase lantus to 35 units and take every morning before breakfast please

## 2011-12-02 ENCOUNTER — Other Ambulatory Visit: Payer: Self-pay | Admitting: Family Medicine

## 2011-12-06 DIAGNOSIS — M26649 Arthritis of unspecified temporomandibular joint: Secondary | ICD-10-CM | POA: Insufficient documentation

## 2011-12-06 NOTE — Assessment & Plan Note (Signed)
Controlled, no change in medication DASH diet and commitment to daily physical activity for a minimum of 30 minutes discussed and encouraged, as a part of hypertension management. The importance of attaining a healthy weight is also discussed.  

## 2011-12-06 NOTE — Assessment & Plan Note (Signed)
Deteriorated. Patient re-educated about  the importance of commitment to a  minimum of 150 minutes of exercise per week. The importance of healthy food choices with portion control discussed. Encouraged to start a food diary, count calories and to consider  joining a support group. Sample diet sheets offered. Goals set by the patient for the next several months.    

## 2011-12-06 NOTE — Assessment & Plan Note (Signed)
Uncontrolled and deteriorated. Diligence with diet , med adherence and blood sugar testing stressed. Updated lab in October Patient advised to reduce carb and sweets, commit to regular physical activity, take meds as prescribed, test blood as directed, and attempt to lose weight, to improve blood sugar control.

## 2011-12-06 NOTE — Assessment & Plan Note (Signed)
Acute right jaw pain 1 week ago, first episode. Educated about the disease, ise of cool compress and anti inflammatories also avoidance of excessive chewing eg gum

## 2011-12-06 NOTE — Assessment & Plan Note (Signed)
Increased and uncontrolled in July. The importance of . Med adherence, dietary advice also given

## 2011-12-06 NOTE — Progress Notes (Signed)
  Subjective:    Patient ID: Jennifer Blackburn, female    DOB: Dec 15, 1945, 66 y.o.   MRN: [id number]  HPI The PT is here for follow up and re-evaluation of chronic medical conditions, medication management and review of any available recent lab and radiology data.  Preventive health is updated, specifically  Cancer screening and Immunization.    The PT denies any adverse reactions to current medications since the last visit.  There are no new concerns.  C/o right ear and jaw pain 1 week ago, no associated trauma, aggravated by opening her mouth. No h/o dental or sinus problems, no fever or chills States blood sugars are improving but still fluctuate a lot, not exercising on a regular basis yet     Review of Systems See HPI Denies recent fever or chills. Denies sinus pressure, nasal congestion,  or sore throat. Denies chest congestion, productive cough or wheezing. Denies chest pains, palpitations and leg swelling Denies abdominal pain, nausea, vomiting,diarrhea or constipation.   Denies dysuria, frequency, hesitancy or incontinence. Denies joint pain, swelling and limitation in mobility. Denies headaches, seizures, numbness, or tingling. Denies depression, anxiety or insomnia. Denies skin break down or rash.        Objective:   Physical Exam Patient alert and oriented and in no cardiopulmonary distress.  HEENT: No facial asymmetry, EOMI, no sinus tenderness,  oropharynx pink and moist.  Neck supple no adenopathy.Right Jaw tender on palpation and limiting opening of her moth  Chest: Clear to auscultation bilaterally.  CVS: S1, S2 no murmurs, no S3.  ABD: Soft non tender. Bowel sounds normal.  Ext: No edema  MS: Adequate ROM spine, shoulders, hips and knees.  Skin: Intact, no ulcerations or rash noted.  Psych: Good eye contact, normal affect. Memory intact not anxious or depressed appearing.  CNS: CN 2-12 intact, power, tone and sensation normal  throughout.  Diabetic Foot Check:  Appearance - no lesions, ulcers or calluses Skin - no unusual pallor or redness Sensation - grossly intact to light touch Monofilament testing -  Right - Great toe, medial, central, lateral ball and posterior foot intact Left - Great toe, medial, central, lateral ball and posterior foot intact Pulses Left - Dorsalis Pedis and Posterior Tibia normal Right - Dorsalis Pedis and Posterior Tibia normal       Assessment & Plan:

## 2011-12-13 ENCOUNTER — Encounter: Payer: Medicare Other | Admitting: Family Medicine

## 2011-12-14 DIAGNOSIS — C73 Malignant neoplasm of thyroid gland: Secondary | ICD-10-CM | POA: Diagnosis not present

## 2011-12-14 DIAGNOSIS — E890 Postprocedural hypothyroidism: Secondary | ICD-10-CM | POA: Diagnosis not present

## 2011-12-14 DIAGNOSIS — E1159 Type 2 diabetes mellitus with other circulatory complications: Secondary | ICD-10-CM | POA: Diagnosis not present

## 2011-12-22 DIAGNOSIS — E1159 Type 2 diabetes mellitus with other circulatory complications: Secondary | ICD-10-CM | POA: Diagnosis not present

## 2011-12-22 DIAGNOSIS — E039 Hypothyroidism, unspecified: Secondary | ICD-10-CM | POA: Diagnosis not present

## 2011-12-22 DIAGNOSIS — C73 Malignant neoplasm of thyroid gland: Secondary | ICD-10-CM | POA: Diagnosis not present

## 2011-12-22 DIAGNOSIS — E785 Hyperlipidemia, unspecified: Secondary | ICD-10-CM | POA: Diagnosis not present

## 2011-12-23 ENCOUNTER — Ambulatory Visit (INDEPENDENT_AMBULATORY_CARE_PROVIDER_SITE_OTHER): Payer: Medicare Other | Admitting: Psychiatry

## 2011-12-23 DIAGNOSIS — F419 Anxiety disorder, unspecified: Secondary | ICD-10-CM

## 2011-12-23 DIAGNOSIS — F411 Generalized anxiety disorder: Secondary | ICD-10-CM
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
## 2011-12-27 NOTE — Patient Instructions (Signed)
Discussed orally 

## 2012-01-10 ENCOUNTER — Other Ambulatory Visit: Payer: Self-pay | Admitting: Family Medicine

## 2012-01-13 ENCOUNTER — Ambulatory Visit (INDEPENDENT_AMBULATORY_CARE_PROVIDER_SITE_OTHER): Payer: Medicare Other | Admitting: Psychiatry

## 2012-01-13 DIAGNOSIS — F411 Generalized anxiety disorder: Secondary | ICD-10-CM

## 2012-01-13 DIAGNOSIS — F419 Anxiety disorder, unspecified: Secondary | ICD-10-CM

## 2012-01-17 NOTE — Patient Instructions (Signed)
Discussed orally 

## 2012-01-17 NOTE — Progress Notes (Signed)
Patient:  Jennifer Blackburn   DOB: 08/23/1945  MR Number: [id number]  [facility]:  [address] [place],  [place], [address]  Start: Thursday 10/242013 1:05 PM End: Thursday 10/242013 1:55 PM  Provider/Observer:     Florencia Reasons, MSW, LCSW   Chief Complaint:      Chief Complaint  Patient presents with  . Depression  . Anxiety   Reason for Service: The patient is a returning patient to this practice. She was seen briefly from 01/2009 through 03/2009. She is resuming services today due to to experiencing increased anxiety. Patient states being nervous, feeling sluggish, and feeling like she has no time for self. She is deeply involved in leadership roles in her church and has multiple responsibilities. She reports marital discord as her husband constantly tells her where to go and what to do. She also reports worry about 2 adult children. Her 47 year old daughter recently has gone through a contentious divorce. Her 56 year old son has been unemployed and patient has assisted him and his family financially for about a year. She also worries about her granddaughter who is in the fifth grade and has had a decline in her grades for the past year. Patient also is anxious about her weight gain as she had hip replacement surgery in April 2012 and has not felt like regularly participating in exercise. Patient is seen today for a followup appointment.  Interventions Strategy:  Supportive therapy  Participation Level:   Active  Participation Quality:  Appropriate      Behavioral Observation:  Well Groomed, Alert,  inappropriately smiles at times when discussing distressing issues  Current Psychosocial Factors: Patient reports continued marital distress related to ongoing trust issues.  Content of Session:   Reviewing symptoms, processing feelings, identifying areas within patient's control, reinforcing patient's use of relaxation techniques  Current Status:   Patient reports  increased motivation, decreased fatigue, but continued anxiety and worry  Patient Progress:   Fair. Patient reports she has maintained involvement in various activities but expresses more frustration regarding issues at her church.  Therapist works with patient to identify ways to set and maintain boundaries regarding her responsibilities at church. . She reports she still is not taking medication as prescribed by Dr. Lodema Hong due to fear of weight gain. However, patient still is considering the possible benefits of the medication. Patient expresses continued anxiety and frustration regarding her marriage. Therapist and patient also explore areas within patient's control and ways to establish realistic expectations in the relationship with her husband.   Target Goals:   Decrease anxiety  Last Reviewed:     Goals Addressed Today:    Decrease anxiety   Impression/Diagnosis:  The patient presents with a history of anxiety. She was seen briefly in this practice in 2010 due to to anxiety and stress. She is resuming services due to to increased anxiety and reports nervousness, excessive worrying, low-energy, and sleep difficulty. Diagnosis: Anxiety disorder, rule out GAD.      Diagnosis:  Axis I:  1. Anxiety disorder             Axis II: No diagnosis

## 2012-02-10 ENCOUNTER — Ambulatory Visit (HOSPITAL_COMMUNITY): Payer: Self-pay | Admitting: Psychiatry

## 2012-02-11 ENCOUNTER — Encounter: Payer: Self-pay | Admitting: Family Medicine

## 2012-02-11 ENCOUNTER — Ambulatory Visit (INDEPENDENT_AMBULATORY_CARE_PROVIDER_SITE_OTHER): Payer: Medicare Other | Admitting: Family Medicine

## 2012-02-11 VITALS — BP 128/72 | HR 70 | Resp 18 | Ht 65.0 in | Wt 200.0 lb

## 2012-02-11 DIAGNOSIS — J019 Acute sinusitis, unspecified: Secondary | ICD-10-CM | POA: Insufficient documentation

## 2012-02-11 MED ORDER — PENICILLIN V POTASSIUM 500 MG PO TABS: 500.0000 mg | ORAL_TABLET | Freq: Three times a day (TID) | ORAL | Status: DC

## 2012-02-11 NOTE — Progress Notes (Signed)
  Subjective:    Patient ID: Jennifer Blackburn, female    DOB: 03-30-1945, 66 y.o.   MRN: [id number]  HPI Sinus drainage down throat and nares for the past week. She also has a cough with no production. She feels pressure in her head in her ear pops in her throat hurts. She's been using diabetic Tussend. Denies fever, nausea vomiting. She has diabetes mellitus.    Review of Systems  GEN- denies fatigue, fever, weight loss,weakness, recent illness HEENT- denies eye drainage, change in vision, nasal discharge, CVS- denies chest pain, palpitations RESP- denies SOB, cough, wheeze ABD- denies N/V, change in stools, abd pain GU- denies dysuria, hematuria, dribbling, incontinence MSK- denies joint pain, muscle aches, injury Neuro- denies headache, dizziness, syncope, seizure activity      Objective:   Physical Exam GEN- NAD, alert and oriented x3 HEENT- PERRL, EOMI, non injected sclera, pink conjunctiva, MMM, oropharynx mild injection, TM clear bilat no effusion, no  maxillary sinus tenderness,+ inflammed turbinates,  +Nasal drainage  Neck- Supple, no LAD CVS- RRR, no murmur RESP-CTAB EXT- No edema Pulses- Radial 2+          Assessment & Plan:

## 2012-02-11 NOTE — Patient Instructions (Signed)
Take the antibiotics Use mucinex sinus  Continue all other medications Try the nasal saline  Keep appt with Dr. Lodema Hong

## 2012-02-11 NOTE — Assessment & Plan Note (Signed)
1 week duration will treat with antibiotics as pt has DM other comorbities, mucinex, nasal saline, she does not tolerate flonase

## 2012-02-21 ENCOUNTER — Other Ambulatory Visit: Payer: Self-pay | Admitting: Family Medicine

## 2012-02-29 ENCOUNTER — Ambulatory Visit: Payer: Self-pay | Admitting: Family Medicine

## 2012-03-10 DIAGNOSIS — Z23 Encounter for immunization: Secondary | ICD-10-CM | POA: Diagnosis not present

## 2012-03-14 ENCOUNTER — Other Ambulatory Visit: Payer: Self-pay | Admitting: Family Medicine

## 2012-03-27 ENCOUNTER — Other Ambulatory Visit: Payer: Self-pay | Admitting: Family Medicine

## 2012-04-03 ENCOUNTER — Other Ambulatory Visit: Payer: Self-pay | Admitting: Family Medicine

## 2012-04-03 DIAGNOSIS — E785 Hyperlipidemia, unspecified: Secondary | ICD-10-CM | POA: Diagnosis not present

## 2012-04-03 DIAGNOSIS — E119 Type 2 diabetes mellitus without complications: Secondary | ICD-10-CM | POA: Diagnosis not present

## 2012-04-04 LAB — COMPLETE METABOLIC PANEL WITH GFR
ALT: 23 U/L (ref 0–35)
AST: 21 U/L (ref 0–37)
Albumin: 4.4 g/dL (ref 3.5–5.2)
Alkaline Phosphatase: 45 U/L (ref 39–117)
BUN: 11 mg/dL (ref 6–23)
CO2: 28 mEq/L (ref 19–32)
Calcium: 9.1 mg/dL (ref 8.4–10.5)
Chloride: 101 mEq/L (ref 96–112)
Creat: 0.77 mg/dL (ref 0.50–1.10)
GFR, Est African American: >89 mL/min
GFR, Est Non African American: 80 mL/min
Glucose, Bld: 168 mg/dL — ABNORMAL HIGH (ref 70–99)
Potassium: 4.4 mEq/L (ref 3.5–5.3)
Sodium: 138 mEq/L (ref 135–145)
Total Bilirubin: 0.3 mg/dL (ref 0.3–1.2)
Total Protein: 7.5 g/dL (ref 6.0–8.3)

## 2012-04-04 LAB — HEMOGLOBIN A1C
Hgb A1c MFr Bld: 9.4 % — ABNORMAL HIGH (ref <5.7)
Mean Plasma Glucose: 223 mg/dL — ABNORMAL HIGH (ref <117)

## 2012-04-04 LAB — LIPID PANEL
Cholesterol: 181 mg/dL (ref 0–200)
HDL: 41 mg/dL (ref >39)
LDL Cholesterol: 120 mg/dL — ABNORMAL HIGH (ref 0–99)
Total CHOL/HDL Ratio: 4.4 Ratio
Triglycerides: 101 mg/dL (ref <150)
VLDL: 20 mg/dL (ref 0–40)

## 2012-04-06 ENCOUNTER — Encounter: Payer: Self-pay | Admitting: Family Medicine

## 2012-04-06 ENCOUNTER — Ambulatory Visit (INDEPENDENT_AMBULATORY_CARE_PROVIDER_SITE_OTHER): Payer: Medicare Other | Admitting: Family Medicine

## 2012-04-06 ENCOUNTER — Telehealth (HOSPITAL_COMMUNITY): Payer: Self-pay | Admitting: Dietician

## 2012-04-06 VITALS — BP 130/72 | HR 80 | Resp 16 | Ht 65.0 in | Wt 199.4 lb

## 2012-04-06 DIAGNOSIS — H6690 Otitis media, unspecified, unspecified ear: Secondary | ICD-10-CM | POA: Diagnosis not present

## 2012-04-06 DIAGNOSIS — IMO0001 Reserved for inherently not codable concepts without codable children: Secondary | ICD-10-CM | POA: Diagnosis not present

## 2012-04-06 DIAGNOSIS — E785 Hyperlipidemia, unspecified: Secondary | ICD-10-CM

## 2012-04-06 DIAGNOSIS — I10 Essential (primary) hypertension: Secondary | ICD-10-CM

## 2012-04-06 DIAGNOSIS — F329 Major depressive disorder, single episode, unspecified: Secondary | ICD-10-CM

## 2012-04-06 DIAGNOSIS — F419 Anxiety disorder, unspecified: Secondary | ICD-10-CM

## 2012-04-06 DIAGNOSIS — H6691 Otitis media, unspecified, right ear: Secondary | ICD-10-CM | POA: Insufficient documentation

## 2012-04-06 DIAGNOSIS — E1165 Type 2 diabetes mellitus with hyperglycemia: Secondary | ICD-10-CM

## 2012-04-06 DIAGNOSIS — F32A Depression, unspecified: Secondary | ICD-10-CM

## 2012-04-06 DIAGNOSIS — F341 Dysthymic disorder: Secondary | ICD-10-CM

## 2012-04-06 DIAGNOSIS — IMO0002 Reserved for concepts with insufficient information to code with codable children: Secondary | ICD-10-CM

## 2012-04-06 MED ORDER — AZITHROMYCIN 250 MG PO TABS: ORAL_TABLET | ORAL | Status: AC

## 2012-04-06 NOTE — Telephone Encounter (Signed)
Received voicemail from pt at [phone]. Called back at 1447. Pt registered for DM class on 04/20/12 at 5:30 PM.

## 2012-04-06 NOTE — Patient Instructions (Addendum)
F/u in 5 weeks. You need to test and record blood sugars twice daily, blood sugar is uncontrolled Bring log book to next visit please You will get info on diabetic class, just need to go Call if blood sugars remain high before next visit  Take 40 units of lantus every morning as prescribed. Goal for fasting blood sugar ranges from 80 to 120 and 2 hours after any meal or at bedtime should be between 130 to 170.  Cholesterol has improved, just  cut back on fried and fatty foods  Meds as per listed.  You are treated for ear infection   Please resume the paxil once daily and continue therapy, you need both  It is important that you exercise regularly at least 30 minutes 5 times a week. If you develop chest pain, have severe difficulty breathing, or feel very tired, stop exercising immediately and seek medical attention

## 2012-04-06 NOTE — Progress Notes (Signed)
  Subjective:    HPI The PT is here for follow up and re-evaluation of chronic medical conditions, medication management and review of any available recent lab and radiology data.  Preventive health is updated, specifically  Cancer screening and Immunization.   C/o right ear pain with fullness and pressure x 1 week, intermittent chills and sinus pressure, no sore throat or cough. Has not been testing sugars, but states "everything is out of control" knew sugar was bad , but is astounded as to just how bad it really is. Will go t class, and test as requested Increased and uncontrolled stress and anxiety from every angle, not suicidal or homicidal            Assessment:      Plan:       Review of Systems See HPI Denies chest congestion, productive cough or wheezing. Denies chest pains, palpitations and leg swelling Denies abdominal pain, nausea, vomiting,diarrhea or constipation.   Denies dysuria, frequency, hesitancy or incontinence. Denies joint pain, swelling and limitation in mobility. Denies headaches, seizures, numbness, or tingling. Denies skin break down or rash.        Objective:   Physical Exam Patient alert and oriented and in no cardiopulmonary distress.  HEENT: No facial asymmetry, EOMI, no sinus tenderness,  oropharynx pink and moist.  Neck supple no adenopathy.right TM dull and slightly erythematous, left normal  Chest: Clear to auscultation bilaterally.  CVS: S1, S2 no murmurs, no S3.  ABD: Soft non tender. Bowel sounds normal.  Ext: No edema  MS: Adequate ROM spine, shoulders, hips and knees.  Skin: Intact, no ulcerations or rash noted.  Psych: Good eye contact, blunted  affect. Memory intact not anxious tearful at times, and depressed appearing.  CNS: CN 2-12 intact, power, tone and sensation normal throughout.        Assessment & Plan:

## 2012-04-07 LAB — MICROALBUMIN / CREATININE URINE RATIO
Creatinine, Urine: 154.9 mg/dL
Microalb Creat Ratio: 5.3 mg/g (ref 0.0–30.0)
Microalb, Ur: 0.82 mg/dL (ref 0.00–1.89)

## 2012-04-08 NOTE — Assessment & Plan Note (Signed)
Deteriorated, pt to resume paxil, start exercise and continue therapy

## 2012-04-08 NOTE — Assessment & Plan Note (Signed)
Controlled, no change in medication DASH diet and commitment to daily physical activity for a minimum of 30 minutes discussed and encouraged, as a part of hypertension management. The importance of attaining a healthy weight is also discussed.  

## 2012-04-08 NOTE — Assessment & Plan Note (Signed)
Deteriorated, counseled for approx 1 minutes in office, also referred to class Return with log in 5 weeks, call before if needed

## 2012-04-08 NOTE — Assessment & Plan Note (Signed)
Improved though still uncontrolled, no med change Hyperlipidemia:Low fat diet discussed and encouraged.

## 2012-04-08 NOTE — Assessment & Plan Note (Signed)
Antibiotic prescribed 

## 2012-04-11 ENCOUNTER — Other Ambulatory Visit: Payer: Self-pay | Admitting: Family Medicine

## 2012-04-11 ENCOUNTER — Ambulatory Visit (INDEPENDENT_AMBULATORY_CARE_PROVIDER_SITE_OTHER): Payer: Medicare Other | Admitting: Psychiatry

## 2012-04-11 DIAGNOSIS — F411 Generalized anxiety disorder: Secondary | ICD-10-CM | POA: Diagnosis not present

## 2012-04-11 DIAGNOSIS — F419 Anxiety disorder, unspecified: Secondary | ICD-10-CM

## 2012-04-11 NOTE — Progress Notes (Signed)
Patient:  Jennifer Blackburn   DOB: 07-15-1945  MR Number: [id number]  Location: [facility]:  [address] [place],  [place], [address]  Start: Tuesday 04/11/2012  1:10 PM End: Tuesday 04/11/2012  2:00 PM  Provider/Observer:     Florencia Reasons, MSW, LCSW   Chief Complaint:      Chief Complaint  Patient presents with  . Anxiety   Reason for Service: The patient is a returning patient to this practice. She was seen briefly from 01/2009 through 03/2009. She has a history of anxiety and excessive worrry.  She is deeply involved in leadership roles in her church and has multiple responsibilities. She reports marital discord as there are trust issues in marriage due to husband's behavior. She reports additional stress as she has diabetes and recently learned that her A1C level is extremely elevated.  Interventions Strategy:  Supportive therapy  Participation Level:   Active  Participation Quality:  Appropriate      Behavioral Observation:  Well Groomed, Alert,  inappropriately smiles at times when discussing distressing issues  Current Psychosocial Factors: Patient reports marital stress, concerns about multiple responsibilities at her church.  Content of Session:   Reviewing symptoms, processing feelings, discussing boundary issues and patient's efforts to set and maintain boundaries regarding church responsibilities.  Current Status:   Patient reports worry but decreased anxiety since taking paxil.  Patient Progress:   Fair. Patient reports morning last week that her A1c level is extremely elevated. Patient reports initially becoming very a long as her primary care physician discussed the possibility of patient needing dialysis should her levels remain elevated. Patient states this has convinced her that she has to do a better job of taking care of herself. She has begun making changes regarding her eating patterns as well as increasing her exercise level. She also has begun to  reevaluate her involvement in various leadership roles that her church. Patient is becoming more assertive and is considering resiging from several of her positions. She also reports a recent incident of becoming more assertive with husband and trying to have more realistic expectations of the relationship. Therapist works with patient to reinforce her efforts to improve self-care as well as assertiveness skills. Patient reports beginning to take Paxil consistently  last week as prescribed by her primary care physician.  Target Goals:   Decrease anxiety  Last Reviewed:     Goals Addressed Today:    Decrease anxiety   Impression/Diagnosis:  The patient presents with a history of anxiety. She was seen briefly in this practice in 2010 due to to anxiety and stress. . Diagnosis: Anxiety disorder, rule out GAD.      Diagnosis:  Axis I:  1. Anxiety disorder             Axis II: No diagnosis

## 2012-04-11 NOTE — Patient Instructions (Signed)
Discussed orally 

## 2012-04-17 ENCOUNTER — Telehealth: Payer: Self-pay | Admitting: Family Medicine

## 2012-04-17 ENCOUNTER — Other Ambulatory Visit: Payer: Self-pay

## 2012-04-17 MED ORDER — GLUCOSE BLOOD VI STRP: ORAL_STRIP | Status: DC

## 2012-04-17 NOTE — Telephone Encounter (Signed)
Med sent to [facility]

## 2012-04-18 DIAGNOSIS — Z96649 Presence of unspecified artificial hip joint: Secondary | ICD-10-CM | POA: Diagnosis not present

## 2012-04-20 ENCOUNTER — Telehealth (HOSPITAL_COMMUNITY): Payer: Self-pay | Admitting: Dietician

## 2012-04-20 NOTE — Telephone Encounter (Signed)
Pt registered for group diabetes class held on 04/20/12 at 5:30 PM. Pt was a no-show

## 2012-04-24 DIAGNOSIS — M7660 Achilles tendinitis, unspecified leg: Secondary | ICD-10-CM | POA: Diagnosis not present

## 2012-04-24 DIAGNOSIS — M62838 Other muscle spasm: Secondary | ICD-10-CM | POA: Diagnosis not present

## 2012-04-25 ENCOUNTER — Ambulatory Visit (INDEPENDENT_AMBULATORY_CARE_PROVIDER_SITE_OTHER): Payer: Medicare Other | Admitting: Family Medicine

## 2012-04-25 ENCOUNTER — Encounter: Payer: Self-pay | Admitting: Family Medicine

## 2012-04-25 VITALS — BP 126/72 | HR 70 | Temp 98.9°F | Resp 18 | Ht 65.0 in | Wt 198.1 lb

## 2012-04-25 DIAGNOSIS — E1165 Type 2 diabetes mellitus with hyperglycemia: Secondary | ICD-10-CM

## 2012-04-25 DIAGNOSIS — J019 Acute sinusitis, unspecified: Secondary | ICD-10-CM

## 2012-04-25 DIAGNOSIS — I10 Essential (primary) hypertension: Secondary | ICD-10-CM

## 2012-04-25 DIAGNOSIS — F341 Dysthymic disorder: Secondary | ICD-10-CM | POA: Diagnosis not present

## 2012-04-25 DIAGNOSIS — IMO0001 Reserved for inherently not codable concepts without codable children: Secondary | ICD-10-CM | POA: Diagnosis not present

## 2012-04-25 DIAGNOSIS — J209 Acute bronchitis, unspecified: Secondary | ICD-10-CM | POA: Insufficient documentation

## 2012-04-25 DIAGNOSIS — E785 Hyperlipidemia, unspecified: Secondary | ICD-10-CM

## 2012-04-25 DIAGNOSIS — F32A Depression, unspecified: Secondary | ICD-10-CM

## 2012-04-25 DIAGNOSIS — F329 Major depressive disorder, single episode, unspecified: Secondary | ICD-10-CM

## 2012-04-25 MED ORDER — CEFTRIAXONE SODIUM 1 G IJ SOLR
500.0000 mg | Freq: Once | INTRAMUSCULAR | Status: AC
  Administered 2012-04-25: 500 mg via INTRAMUSCULAR

## 2012-04-25 MED ORDER — BENZONATATE 100 MG PO CAPS: 100.0000 mg | ORAL_CAPSULE | Freq: Four times a day (QID) | ORAL | Status: DC | PRN

## 2012-04-25 MED ORDER — IPRATROPIUM BROMIDE 0.02 % IN SOLN
0.5000 mg | Freq: Once | RESPIRATORY_TRACT | Status: AC
  Administered 2012-04-25: 0.5 mg via RESPIRATORY_TRACT

## 2012-04-25 MED ORDER — METHYLPREDNISOLONE ACETATE 80 MG/ML IJ SUSP
80.0000 mg | Freq: Once | INTRAMUSCULAR | Status: AC
  Administered 2012-04-25: 80 mg via INTRAMUSCULAR

## 2012-04-25 MED ORDER — DOXYCYCLINE HYCLATE 100 MG PO TABS: 100.0000 mg | ORAL_TABLET | Freq: Two times a day (BID) | ORAL | Status: AC

## 2012-04-25 MED ORDER — PREDNISONE 5 MG PO TABS: 5.0000 mg | ORAL_TABLET | Freq: Two times a day (BID) | ORAL | Status: DC

## 2012-04-25 MED ORDER — ALBUTEROL SULFATE (5 MG/ML) 0.5% IN NEBU
2.5000 mg | INHALATION_SOLUTION | Freq: Once | RESPIRATORY_TRACT | Status: AC
  Administered 2012-04-25: 2.5 mg via RESPIRATORY_TRACT

## 2012-04-25 NOTE — Assessment & Plan Note (Addendum)
Neb x 1 Depo medrol 80mg  Im Rocephin 500mg  IM Acute illness aggressive treatment

## 2012-04-25 NOTE — Patient Instructions (Addendum)
F/u April 14 or after, Ok to cancel Feb visit, call if you needme before.  HBa1C chem 7 and EGFR April 13 please   You are treated for acute sinusitis and bronchitis with uncontrolled allergy symptoms.  Breathing treatment in the office, Depo medrol and Rocephin IM  Meds sent to your pharmacy are prednisone, doxycycline and tessalon perles.  From what you say your sugars are much improved, they will increase slightly since on steroids short term, call with concerns.  Take sudafed one daily for the next 3 to 5 days to reduce head congestion and drainage

## 2012-04-30 DIAGNOSIS — J019 Acute sinusitis, unspecified: Secondary | ICD-10-CM | POA: Insufficient documentation

## 2012-04-30 NOTE — Assessment & Plan Note (Signed)
Acute infection, antibiotic course prescribed 

## 2012-04-30 NOTE — Assessment & Plan Note (Signed)
Controlled, no change in medication DASH diet and commitment to daily physical activity for a minimum of 30 minutes discussed and encouraged, as a part of hypertension management. The importance of attaining a healthy weight is also discussed.  

## 2012-04-30 NOTE — Progress Notes (Signed)
  Subjective:    Patient ID: Jennifer Blackburn, female    DOB: September 05, 1945, 67 y.o.   MRN: [id number]  HPI 1 week h/o worsening head and chest congestion with yellow green drainage from nose and cough productive of yellow green sputum, excessive uncontrolled cough with wheezing fever and chills in the past 3 days symptoms have worsened. C/o sore throat , denies ear pain Reports improved blood sugars and that she tests at least twice , often 3 times daily. Improved anxiety and depression on resumption on paxil, still seeing therapist   Review of Systems See HPI  Denies chest pains, palpitations and leg swelling Denies abdominal pain, nausea, vomiting,diarrhea or constipation.   Denies dysuria, frequency, hesitancy or incontinence. Denies joint pain, swelling and limitation in mobility. Denies headaches, seizures, numbness, or tingling. Denies uncontrolled  depression, anxiety or insomnia. Denies skin break down or rash.        Objective:   Physical Exam Patient alert and oriented and in mild  cardiopulmonary distress.Ill appearing, excessive cough  HEENT: No facial asymmetry, EOMI, maxillary  sinus tenderness,  Oropharynx erythematous, no exudate and moist.  Neck supple anterior cervical  adenopathy.  Chest: adequaate though reduced air entry, scattered crackles , few wheezes  CVS: S1, S2 no murmurs, no S3.  ABD: Soft non tender. Bowel sounds normal.  Ext: No edema  MS: Adequate ROM spine, shoulders, hips and knees.  Skin: Intact, no ulcerations or rash noted.  Psych: Good eye contact, normal affect. Memory intact not anxious or depressed appearing.  CNS: CN 2-12 intact, power, tone and sensation normal throughout.        Assessment & Plan:

## 2012-04-30 NOTE — Assessment & Plan Note (Signed)
Reports improvement in blood sugars, will f/u HBa1C when next due Patient advised to reduce carb and sweets, commit to regular physical activity, take meds as prescribed, test blood as directed, and attempt to lose weight, to improve blood sugar control.

## 2012-04-30 NOTE — Assessment & Plan Note (Signed)
Improved with resumption of medication as well as therapy

## 2012-05-11 ENCOUNTER — Ambulatory Visit: Payer: Medicare Other | Admitting: Family Medicine

## 2012-05-12 ENCOUNTER — Ambulatory Visit (HOSPITAL_COMMUNITY): Payer: Self-pay | Admitting: Psychiatry

## 2012-05-14 ENCOUNTER — Other Ambulatory Visit: Payer: Self-pay | Admitting: Family Medicine

## 2012-05-15 DIAGNOSIS — Z1231 Encounter for screening mammogram for malignant neoplasm of breast: Secondary | ICD-10-CM | POA: Diagnosis not present

## 2012-05-15 DIAGNOSIS — N951 Menopausal and female climacteric states: Secondary | ICD-10-CM | POA: Diagnosis not present

## 2012-05-18 DIAGNOSIS — E119 Type 2 diabetes mellitus without complications: Secondary | ICD-10-CM | POA: Diagnosis not present

## 2012-05-18 DIAGNOSIS — H5200 Hypermetropia, unspecified eye: Secondary | ICD-10-CM | POA: Diagnosis not present

## 2012-06-12 ENCOUNTER — Other Ambulatory Visit: Payer: Self-pay | Admitting: Family Medicine

## 2012-06-26 DIAGNOSIS — H2589 Other age-related cataract: Secondary | ICD-10-CM | POA: Diagnosis not present

## 2012-07-03 ENCOUNTER — Ambulatory Visit: Payer: Medicare Other | Admitting: Family Medicine

## 2012-07-03 ENCOUNTER — Encounter (HOSPITAL_COMMUNITY): Payer: Self-pay | Admitting: Pharmacy Technician

## 2012-07-11 ENCOUNTER — Encounter (HOSPITAL_COMMUNITY)
Admission: RE | Admit: 2012-07-11 | Discharge: 2012-07-11 | Disposition: A | Discharge status: Home / Self Care | Payer: Medicare Other | Source: Ambulatory Visit | Attending: Ophthalmology | Admitting: Ophthalmology

## 2012-07-11 ENCOUNTER — Other Ambulatory Visit: Payer: Self-pay

## 2012-07-11 ENCOUNTER — Encounter (HOSPITAL_COMMUNITY): Payer: Self-pay

## 2012-07-11 DIAGNOSIS — E119 Type 2 diabetes mellitus without complications: Secondary | ICD-10-CM | POA: Diagnosis not present

## 2012-07-11 DIAGNOSIS — I10 Essential (primary) hypertension: Secondary | ICD-10-CM | POA: Diagnosis not present

## 2012-07-11 DIAGNOSIS — H2589 Other age-related cataract: Secondary | ICD-10-CM | POA: Diagnosis not present

## 2012-07-11 DIAGNOSIS — Z01810 Encounter for preprocedural cardiovascular examination: Secondary | ICD-10-CM | POA: Diagnosis not present

## 2012-07-11 DIAGNOSIS — Z01812 Encounter for preprocedural laboratory examination: Secondary | ICD-10-CM | POA: Diagnosis not present

## 2012-07-11 LAB — BASIC METABOLIC PANEL
BUN: 13 mg/dL (ref 6–23)
CO2: 29 mEq/L (ref 19–32)
Calcium: 9.1 mg/dL (ref 8.4–10.5)
Chloride: 95 mEq/L — ABNORMAL LOW (ref 96–112)
Creatinine, Ser: 0.75 mg/dL (ref 0.50–1.10)
GFR calc Af Amer: >90 mL/min (ref >90)
GFR calc non Af Amer: 86 mL/min — ABNORMAL LOW (ref >90)
Glucose, Bld: 209 mg/dL — ABNORMAL HIGH (ref 70–99)
Potassium: 4.1 mEq/L (ref 3.5–5.1)
Sodium: 135 mEq/L (ref 135–145)

## 2012-07-11 LAB — HEMOGLOBIN AND HEMATOCRIT, BLOOD
HCT: 37 % (ref 36.0–46.0)
Hemoglobin: 12 g/dL (ref 12.0–15.0)

## 2012-07-11 NOTE — Patient Instructions (Signed)
Your procedure is scheduled on:  07/17/12  Report to [facility] at 10:00 AM.  Call this number if you have problems the morning of surgery: [phone]   Remember:   Do not eat or drink:After Midnight.  Take these medicines the morning of surgery with A SIP OF WATER: Paroxetine, Levothyroxine, Metoprolol, Quinapril and Verapamil. Take only half of your dose of Lantus insulin the night before the procedure.   Do not wear jewelry, make-up or nail polish.  Do not wear lotions, powders, or perfumes. You may wear deodorant.  Do not shave 48 hours prior to surgery. Men may shave face and neck.  Do not bring valuables to the hospital.  Contacts, dentures or bridgework may not be worn into surgery.  Leave suitcase in the car. After surgery it may be brought to your room.  For patients admitted to the hospital, checkout time is 11:00 AM the day of discharge.   Patients discharged the day of surgery will not be allowed to drive home.    Special Instructions: Start using your eye drops before surgery as directed by your eye doctor.   Please read over the following fact sheets that you were given: Anesthesia Post-op Instructions    Cataract Surgery  A cataract is a clouding of the lens of the eye. When a lens becomes cloudy, vision is reduced based on the degree and nature of the clouding. Surgery may be needed to improve vision. Surgery removes the cloudy lens and usually replaces it with a substitute lens (intraocular lens, IOL). LET YOUR EYE DOCTOR KNOW ABOUT:  Allergies to food or medicine.  Medicines taken including herbs, eyedrops, over-the-counter medicines, and creams.  Use of steroids (by mouth or creams).  Previous problems with anesthetics or numbing medicine.  History of bleeding problems or blood clots.  Previous surgery.  Other health problems, including diabetes and kidney problems.  Possibility of pregnancy, if this applies. RISKS AND  COMPLICATIONS  Infection.  Inflammation of the eyeball (endophthalmitis) that can spread to both eyes (sympathetic ophthalmia).  Poor wound healing.  If an IOL is inserted, it can later fall out of proper position. This is very uncommon.  Clouding of the part of your eye that holds an IOL in place. This is called an "after-cataract." These are uncommon, but easily treated. BEFORE THE PROCEDURE  Do not eat or drink anything except small amounts of water for 8 to 12 before your surgery, or as directed by your caregiver.  Unless you are told otherwise, continue any eyedrops you have been prescribed.  Talk to your primary caregiver about all other medicines that you take (both prescription and non-prescription). In some cases, you may need to stop or change medicines near the time of your surgery. This is most important if you are taking blood-thinning medicine.Do not stop medicines unless you are told to do so.  Arrange for someone to drive you to and from the procedure.  Do not put contact lenses in either eye on the day of your surgery. PROCEDURE There is more than one method for safely removing a cataract. Your doctor can explain the differences and help determine which is best for you. Phacoemulsification surgery is the most common form of cataract surgery.  An injection is given behind the eye or eyedrops are given to make this a painless procedure.  A small cut (incision) is made on the edge of the clear, dome-shaped surface that covers the front of the eye (cornea).  A tiny probe  is painlessly inserted into the eye. This device gives off ultrasound waves that soften and break up the cloudy center of the lens. This makes it easier for the cloudy lens to be removed by suction.  An IOL may be implanted.  The normal lens of the eye is covered by a clear capsule. Part of that capsule is intentionally left in the eye to support the IOL.  Your surgeon may or may not use stitches to  close the incision. There are other forms of cataract surgery that require a larger incision and stiches to close the eye. This approach is taken in cases where the doctor feels that the cataract cannot be easily removed using phacoemulsification. AFTER THE PROCEDURE  When an IOL is implanted, it does not need care. It becomes a permanent part of your eye and cannot be seen or felt.  Your doctor will schedule follow-up exams to check on your progress.  Review your other medicines with your doctor to see which can be resumed after surgery.  Use eyedrops or take medicine as prescribed by your doctor. Document Released: 02/25/2011 Document Revised: 05/31/2011 Document Reviewed: 02/25/2011 [facility] Patient Information 2013 [place], [place].    PATIENT INSTRUCTIONS POST-ANESTHESIA  IMMEDIATELY FOLLOWING SURGERY:  Do not drive or operate machinery for the first twenty four hours after surgery.  Do not make any important decisions for twenty four hours after surgery or while taking narcotic pain medications or sedatives.  If you develop intractable nausea and vomiting or a severe headache please notify your doctor immediately.  FOLLOW-UP:  Please make an appointment with your surgeon as instructed. You do not need to follow up with anesthesia unless specifically instructed to do so.  WOUND CARE INSTRUCTIONS (if applicable):  Keep a dry clean dressing on the anesthesia/puncture wound site if there is drainage.  Once the wound has quit draining you may leave it open to air.  Generally you should leave the bandage intact for twenty four hours unless there is drainage.  If the epidural site drains for more than 36-48 hours please call the anesthesia department.  QUESTIONS?:  Please feel free to call your physician or the hospital operator if you have any questions, and they will be happy to assist you.

## 2012-07-14 MED ORDER — LIDOCAINE HCL 3.5 % OP GEL
OPHTHALMIC | Status: AC
  Filled 2012-07-14: qty 5

## 2012-07-14 MED ORDER — TETRACAINE HCL 0.5 % OP SOLN
OPHTHALMIC | Status: AC
  Filled 2012-07-14: qty 2

## 2012-07-14 MED ORDER — NEOMYCIN-POLYMYXIN-DEXAMETH 3.5-10000-0.1 OP OINT
TOPICAL_OINTMENT | OPHTHALMIC | Status: AC
  Filled 2012-07-14: qty 3.5

## 2012-07-14 MED ORDER — LIDOCAINE HCL (PF) 1 % IJ SOLN
INTRAMUSCULAR | Status: AC
  Filled 2012-07-14: qty 2

## 2012-07-14 MED ORDER — CYCLOPENTOLATE-PHENYLEPHRINE 0.2-1 % OP SOLN
OPHTHALMIC | Status: AC
  Filled 2012-07-14: qty 2

## 2012-07-14 MED ORDER — PHENYLEPHRINE HCL 2.5 % OP SOLN
OPHTHALMIC | Status: AC
  Filled 2012-07-14: qty 2

## 2012-07-17 ENCOUNTER — Ambulatory Visit (HOSPITAL_COMMUNITY): Payer: Medicare Other | Admitting: Anesthesiology

## 2012-07-17 ENCOUNTER — Encounter (HOSPITAL_COMMUNITY): Payer: Self-pay | Admitting: Anesthesiology

## 2012-07-17 ENCOUNTER — Encounter (HOSPITAL_COMMUNITY)
Admission: RE | Disposition: A | Discharge status: Home / Self Care | Payer: Self-pay | Source: Ambulatory Visit | Attending: Ophthalmology

## 2012-07-17 ENCOUNTER — Ambulatory Visit (HOSPITAL_COMMUNITY)
Admission: RE | Admit: 2012-07-17 | Discharge: 2012-07-17 | Disposition: A | Discharge status: Home / Self Care | Payer: Medicare Other | Source: Ambulatory Visit | Attending: Ophthalmology | Admitting: Ophthalmology

## 2012-07-17 ENCOUNTER — Encounter (HOSPITAL_COMMUNITY): Payer: Self-pay | Admitting: *Deleted

## 2012-07-17 DIAGNOSIS — Z01812 Encounter for preprocedural laboratory examination: Secondary | ICD-10-CM | POA: Insufficient documentation

## 2012-07-17 DIAGNOSIS — E119 Type 2 diabetes mellitus without complications: Secondary | ICD-10-CM | POA: Insufficient documentation

## 2012-07-17 DIAGNOSIS — Z01810 Encounter for preprocedural cardiovascular examination: Secondary | ICD-10-CM | POA: Diagnosis not present

## 2012-07-17 DIAGNOSIS — H2589 Other age-related cataract: Secondary | ICD-10-CM | POA: Diagnosis not present

## 2012-07-17 DIAGNOSIS — I10 Essential (primary) hypertension: Secondary | ICD-10-CM | POA: Diagnosis not present

## 2012-07-17 DIAGNOSIS — H269 Unspecified cataract: Secondary | ICD-10-CM | POA: Diagnosis not present

## 2012-07-17 HISTORY — PX: CATARACT EXTRACTION W/PHACO: SHX586

## 2012-07-17 LAB — GLUCOSE, CAPILLARY: Glucose-Capillary: 131 mg/dL — ABNORMAL HIGH (ref 70–99)

## 2012-07-17 SURGERY — PHACOEMULSIFICATION, CATARACT, WITH IOL INSERTION
Anesthesia: Monitor Anesthesia Care | Site: Eye | Laterality: Right | Wound class: Clean

## 2012-07-17 MED ORDER — PROVISC 10 MG/ML IO SOLN
INTRAOCULAR | Status: DC | PRN
  Administered 2012-07-17: 8.5 mg via INTRAOCULAR

## 2012-07-17 MED ORDER — POVIDONE-IODINE 5 % OP SOLN
OPHTHALMIC | Status: DC | PRN
  Administered 2012-07-17: 1 via OPHTHALMIC

## 2012-07-17 MED ORDER — LACTATED RINGERS IV SOLN
INTRAVENOUS | Status: DC | PRN
  Administered 2012-07-17: 12:00:00 via INTRAVENOUS

## 2012-07-17 MED ORDER — LACTATED RINGERS IV SOLN
INTRAVENOUS | Status: DC
  Administered 2012-07-17: 1000 mL via INTRAVENOUS

## 2012-07-17 MED ORDER — BSS IO SOLN
INTRAOCULAR | Status: DC | PRN
  Administered 2012-07-17: 15 mL via INTRAOCULAR

## 2012-07-17 MED ORDER — CYCLOPENTOLATE-PHENYLEPHRINE 0.2-1 % OP SOLN
1.0000 [drp] | OPHTHALMIC | Status: AC
  Administered 2012-07-17 (×3): 1 [drp] via OPHTHALMIC

## 2012-07-17 MED ORDER — LIDOCAINE HCL 3.5 % OP GEL
1.0000 "application " | Freq: Once | OPHTHALMIC | Status: AC
  Administered 2012-07-17: 1 via OPHTHALMIC

## 2012-07-17 MED ORDER — NEOMYCIN-POLYMYXIN-DEXAMETH 0.1 % OP OINT
TOPICAL_OINTMENT | OPHTHALMIC | Status: DC | PRN
  Administered 2012-07-17: 1 via OPHTHALMIC

## 2012-07-17 MED ORDER — EPINEPHRINE HCL 1 MG/ML IJ SOLN
INTRAMUSCULAR | Status: AC
  Filled 2012-07-17: qty 1

## 2012-07-17 MED ORDER — TETRACAINE HCL 0.5 % OP SOLN
1.0000 [drp] | OPHTHALMIC | Status: AC
  Administered 2012-07-17 (×3): 1 [drp] via OPHTHALMIC

## 2012-07-17 MED ORDER — LIDOCAINE HCL (PF) 1 % IJ SOLN
INTRAOCULAR | Status: DC | PRN
  Administered 2012-07-17: 13:00:00 via OPHTHALMIC

## 2012-07-17 MED ORDER — MIDAZOLAM HCL 2 MG/2ML IJ SOLN
INTRAMUSCULAR | Status: AC
  Filled 2012-07-17: qty 2

## 2012-07-17 MED ORDER — EPINEPHRINE HCL 1 MG/ML IJ SOLN
INTRAOCULAR | Status: DC | PRN
  Administered 2012-07-17: 13:00:00

## 2012-07-17 MED ORDER — MIDAZOLAM HCL 2 MG/2ML IJ SOLN
1.0000 mg | INTRAMUSCULAR | Status: DC | PRN
  Administered 2012-07-17: 2 mg via INTRAVENOUS

## 2012-07-17 MED ORDER — PHENYLEPHRINE HCL 2.5 % OP SOLN
1.0000 [drp] | OPHTHALMIC | Status: AC
  Administered 2012-07-17 (×3): 1 [drp] via OPHTHALMIC

## 2012-07-17 SURGICAL SUPPLY — 32 items
CAPSULAR TENSION RING-AMO (OPHTHALMIC RELATED) IMPLANT
CLOTH BEACON ORANGE TIMEOUT ST (SAFETY) ×1 IMPLANT
EYE SHIELD UNIVERSAL CLEAR (GAUZE/BANDAGES/DRESSINGS) ×1 IMPLANT
GLOVE BIO SURGEON STRL SZ 6.5 (GLOVE) IMPLANT
GLOVE BIOGEL PI IND STRL 6.5 (GLOVE) IMPLANT
GLOVE BIOGEL PI IND STRL 7.0 (GLOVE) IMPLANT
GLOVE BIOGEL PI IND STRL 7.5 (GLOVE) IMPLANT
GLOVE BIOGEL PI INDICATOR 6.5 (GLOVE) ×1
GLOVE BIOGEL PI INDICATOR 7.0 (GLOVE) ×1
GLOVE BIOGEL PI INDICATOR 7.5 (GLOVE)
GLOVE ECLIPSE 6.5 STRL STRAW (GLOVE) IMPLANT
GLOVE ECLIPSE 7.0 STRL STRAW (GLOVE) IMPLANT
GLOVE ECLIPSE 7.5 STRL STRAW (GLOVE) IMPLANT
GLOVE EXAM NITRILE LRG STRL (GLOVE) IMPLANT
GLOVE EXAM NITRILE MD LF STRL (GLOVE) IMPLANT
GLOVE SKINSENSE NS SZ6.5 (GLOVE)
GLOVE SKINSENSE NS SZ7.0 (GLOVE)
GLOVE SKINSENSE STRL SZ6.5 (GLOVE) IMPLANT
GLOVE SKINSENSE STRL SZ7.0 (GLOVE) IMPLANT
KIT VITRECTOMY (OPHTHALMIC RELATED) IMPLANT
PAD ARMBOARD 7.5X6 YLW CONV (MISCELLANEOUS) ×1 IMPLANT
PROC W NO LENS (INTRAOCULAR LENS)
PROC W SPEC LENS (INTRAOCULAR LENS)
PROCESS W NO LENS (INTRAOCULAR LENS) IMPLANT
PROCESS W SPEC LENS (INTRAOCULAR LENS) IMPLANT
RING MALYGIN (MISCELLANEOUS) IMPLANT
SIGHTPATH CAT PROC W REG LENS (Ophthalmic Related) ×2 IMPLANT
SYR TB 1ML LL NO SAFETY (SYRINGE) ×1 IMPLANT
TAPE SURG TRANSPORE 1 IN (GAUZE/BANDAGES/DRESSINGS) IMPLANT
TAPE SURGICAL TRANSPORE 1 IN (GAUZE/BANDAGES/DRESSINGS) ×1
VISCOELASTIC ADDITIONAL (OPHTHALMIC RELATED) IMPLANT
WATER STERILE IRR 250ML POUR (IV SOLUTION) ×1 IMPLANT

## 2012-07-17 NOTE — H&P (Signed)
I have reviewed the H&P, the patient was re-examined, and I have identified no interval changes in medical condition and plan of care since the history and physical of record  

## 2012-07-17 NOTE — Preoperative (Signed)
Beta Blockers   Reason not to administer Beta Blockers:Not Applicable 

## 2012-07-17 NOTE — Op Note (Signed)
Date of Admission: 07/17/12  Date of Surgery: 07/17/12  Pre-Op Dx: Cataract  Right  Eye  Post-Op Dx: Combined Cataract  Right  Eye, Dx Code 366.19  Surgeon: Gemma Payor, M.D.  Assistants: None  Anesthesia: Topical with MAC  Indications: Painless, progressive loss of vision with compromise of daily activities.  Surgery: Cataract Extraction with Intraocular lens Implant Right Eye  Discription: The patient had dilating drops and viscous lidocaine placed into the left eye in the pre-op holding area. After transfer to the operating room, a time out was performed. The patient was then prepped and draped. Beginning with a 75 degree blade a paracentesis port was made at the surgeon's 2 o'clock position. The anterior chamber was then filled with 2% non-preserved lidocaine. This was followed by filling the anterior chamber with Provisc. A bent cystatome needle was used to create a continuous tear capsulotomy. Hydrodissection was performed with balanced salt solution on a Fine canula. The lens nucleus was then removed using the phacoemulsification handpiece. Residual cortex was removed with the I&A handpiece. The anterior chamber and capsular bag were refilled with Provisc. A posterior chamber intraocular lens was placed into the capsular bag with it's injector. The implant was positioned with the Kuglan hook. The Provisc was then removed from the anterior chamber and capsular bag with the I&A handpiece. Stromal hydration of the main incision and paracentesis port was performed with BSS on a Fine canula. The wounds were tested for leak which was negative. The patient tolerated the procedure well. There were no operative complications. The patient was then transferred to the recovery room in stable condition.  Prosthetic device:  B&L enVista, MX60, power 17.0.  Specimen: None  EBL: None  Complications: None

## 2012-07-17 NOTE — Transfer of Care (Signed)
Immediate Anesthesia Transfer of Care Note  Patient: Jennifer Blackburn  Procedure(s) Performed: Procedure(s) with comments: CATARACT EXTRACTION PHACO AND INTRAOCULAR LENS PLACEMENT (IOC) (Right) - CDE:25.51  Patient Location: Short Stay  Anesthesia Type:MAC  Level of Consciousness: awake, alert , oriented and patient cooperative  Airway & Oxygen Therapy: Patient Spontanous Breathing  Post-op Assessment: Report given to PACU RN and Post -op Vital signs reviewed and stable  Post vital signs: Reviewed and stable  Complications: No apparent anesthesia complications

## 2012-07-17 NOTE — Anesthesia Preprocedure Evaluation (Signed)
Anesthesia Evaluation  Patient identified by MRN, date of birth, ID band Patient awake    Reviewed: Allergy & Precautions, H&P , NPO status , Patient's Chart, lab work & pertinent test results, reviewed documented beta blocker date and time   Airway Mallampati: III TM Distance: >3 FB Neck ROM: Limited    Dental  (+) Partial Upper   Pulmonary neg pulmonary ROS,  breath sounds clear to auscultation        Cardiovascular hypertension, + CAD + dysrhythmias Rhythm:Regular Rate:Normal     Neuro/Psych  Headaches, PSYCHIATRIC DISORDERS Anxiety    GI/Hepatic GERD-  Medicated and Controlled,  Endo/Other  diabetes, Well Controlled, Type 2, Oral Hypoglycemic AgentsHypothyroidism   Renal/GU      Musculoskeletal   Abdominal   Peds  Hematology   Anesthesia Other Findings   Reproductive/Obstetrics                           Anesthesia Physical Anesthesia Plan  ASA: III  Anesthesia Plan: MAC   Post-op Pain Management:    Induction: Intravenous  Airway Management Planned: Nasal Cannula  Additional Equipment:   Intra-op Plan:   Post-operative Plan:   Informed Consent: I have reviewed the patients History and Physical, chart, labs and discussed the procedure including the risks, benefits and alternatives for the proposed anesthesia with the patient or authorized representative who has indicated his/her understanding and acceptance.     Plan Discussed with:   Anesthesia Plan Comments:         Anesthesia Quick Evaluation

## 2012-07-17 NOTE — Anesthesia Postprocedure Evaluation (Signed)
  Anesthesia Post-op Note  Patient: Jennifer Blackburn  Procedure(s) Performed: Procedure(s) with comments: CATARACT EXTRACTION PHACO AND INTRAOCULAR LENS PLACEMENT (IOC) (Right) - CDE:25.51  Patient Location: Short Stay  Anesthesia Type:MAC  Level of Consciousness: awake, alert , oriented and patient cooperative  Airway and Oxygen Therapy: Patient Spontanous Breathing  Post-op Pain: none  Post-op Assessment: Post-op Vital signs reviewed, Patient's Cardiovascular Status Stable, Respiratory Function Stable, Patent Airway and No signs of Nausea or vomiting  Post-op Vital Signs: Reviewed and stable  Complications: No apparent anesthesia complications

## 2012-07-20 ENCOUNTER — Encounter (HOSPITAL_COMMUNITY): Payer: Self-pay | Admitting: Ophthalmology

## 2012-07-21 DIAGNOSIS — H2589 Other age-related cataract: Secondary | ICD-10-CM | POA: Diagnosis not present

## 2012-08-09 ENCOUNTER — Other Ambulatory Visit: Payer: Self-pay | Admitting: Family Medicine

## 2012-08-19 ENCOUNTER — Encounter (HOSPITAL_COMMUNITY): Payer: Self-pay | Admitting: *Deleted

## 2012-08-19 ENCOUNTER — Emergency Department (HOSPITAL_COMMUNITY)
Admission: EM | Admit: 2012-08-19 | Discharge: 2012-08-19 | Disposition: A | Discharge status: Home / Self Care | Payer: Medicare Other | Attending: Emergency Medicine | Admitting: Emergency Medicine

## 2012-08-19 DIAGNOSIS — Z79899 Other long term (current) drug therapy: Secondary | ICD-10-CM | POA: Diagnosis not present

## 2012-08-19 DIAGNOSIS — I2510 Atherosclerotic heart disease of native coronary artery without angina pectoris: Secondary | ICD-10-CM | POA: Diagnosis not present

## 2012-08-19 DIAGNOSIS — E785 Hyperlipidemia, unspecified: Secondary | ICD-10-CM | POA: Insufficient documentation

## 2012-08-19 DIAGNOSIS — I10 Essential (primary) hypertension: Secondary | ICD-10-CM | POA: Diagnosis not present

## 2012-08-19 DIAGNOSIS — E119 Type 2 diabetes mellitus without complications: Secondary | ICD-10-CM | POA: Insufficient documentation

## 2012-08-19 DIAGNOSIS — Z794 Long term (current) use of insulin: Secondary | ICD-10-CM | POA: Insufficient documentation

## 2012-08-19 DIAGNOSIS — F411 Generalized anxiety disorder: Secondary | ICD-10-CM | POA: Insufficient documentation

## 2012-08-19 DIAGNOSIS — H5710 Ocular pain, unspecified eye: Secondary | ICD-10-CM | POA: Diagnosis not present

## 2012-08-19 DIAGNOSIS — Z8739 Personal history of other diseases of the musculoskeletal system and connective tissue: Secondary | ICD-10-CM | POA: Diagnosis not present

## 2012-08-19 DIAGNOSIS — R51 Headache: Secondary | ICD-10-CM | POA: Diagnosis not present

## 2012-08-19 DIAGNOSIS — H5789 Other specified disorders of eye and adnexa: Secondary | ICD-10-CM | POA: Diagnosis not present

## 2012-08-19 DIAGNOSIS — Z85850 Personal history of malignant neoplasm of thyroid: Secondary | ICD-10-CM | POA: Insufficient documentation

## 2012-08-19 DIAGNOSIS — H209 Unspecified iridocyclitis: Secondary | ICD-10-CM | POA: Diagnosis not present

## 2012-08-19 DIAGNOSIS — Z9889 Other specified postprocedural states: Secondary | ICD-10-CM | POA: Diagnosis not present

## 2012-08-19 DIAGNOSIS — H5711 Ocular pain, right eye: Secondary | ICD-10-CM

## 2012-08-19 MED ORDER — FLUORESCEIN SODIUM 1 MG OP STRP
ORAL_STRIP | OPHTHALMIC | Status: AC
  Administered 2012-08-19: 11:00:00
  Filled 2012-08-19: qty 2

## 2012-08-19 MED ORDER — TETRACAINE HCL 0.5 % OP SOLN
OPHTHALMIC | Status: AC
  Administered 2012-08-19: 11:00:00
  Filled 2012-08-19: qty 2

## 2012-08-19 MED ORDER — CIPROFLOXACIN HCL 0.3 % OP SOLN
1.0000 [drp] | OPHTHALMIC | Status: DC
  Administered 2012-08-19: 1 [drp] via OPHTHALMIC
  Filled 2012-08-19: qty 2.5

## 2012-08-19 MED ORDER — OXYCODONE-ACETAMINOPHEN 5-325 MG PO TABS
1.0000 | ORAL_TABLET | Freq: Once | ORAL | Status: AC
  Administered 2012-08-19: 1 via ORAL
  Filled 2012-08-19: qty 1

## 2012-08-19 NOTE — ED Notes (Signed)
Pt presents to er with right eye redness, draining clear fluid, painful, headache located around right eye, was seen by eye dr. Isidore Moos yesterday for follow up after cataract surgery.

## 2012-08-19 NOTE — ED Notes (Signed)
Discharge instructions reviewed with pt, questions answered. Pt verbalized understanding.  

## 2012-08-19 NOTE — ED Provider Notes (Signed)
History    This chart was scribed for Vida Roller, MD by Leone Payor, ED Scribe. This patient was seen in room [facility] and the patient's care was started 10:51 AM.   CSN: [id number]  Arrival date & time 08/19/12  1018   First MD Initiated Contact with Patient 08/19/12 1031      Chief Complaint  Patient presents with  . Eye Pain     The history is provided by the patient. No language interpreter was used.    HPI Comments: Jennifer Blackburn is a 67 y.o. female who presents to the Emergency Department complaining of new, constant, unchanged R eye pain that started yesterday. Pt had cataract surgery on R eye in the end of April 2014. She followed up yesterday and was told everything had healed well. She has an associated HA localized around the R eye, eye discharge (thin and watery), and eye redness. States she was painting walls yesterday and the symptoms started yesterday evening. States her vision is improved after the surgery. She denies visual disturbances. She has h/o retina surgery on the same eye 1 year ago.     Past Medical History  Diagnosis Date  . DJD (degenerative joint disease) of lumbar spine   . CAD (coronary artery disease)   . Diabetes mellitus type II     without complication  . Hypertension     benign   . Anxiety   . Thyroid cancer 2001  . Hyperlipidemia     Past Surgical History  Procedure Laterality Date  . Vesicovaginal fistula closure w/ tah    . Thyroidectomy    . Knee surgery Right     arthroscopy  . Tonsillectomy    . Left hip replaced  07/01/2010  . Spine surgery  2006    cervical  . Abdominal hysterectomy    . Joint replacement  07/01/2010    left hip  . Cataract extraction w/phaco Right 07/17/2012    Procedure: CATARACT EXTRACTION PHACO AND INTRAOCULAR LENS PLACEMENT (IOC);  Surgeon: Gemma Payor, MD;  Location: [facility];  Service: Ophthalmology;  Laterality: Right;  CDE:25.51    Family History  Problem Relation Age of Onset  . Heart  attack Father   . Heart failure Mother     History  Substance Use Topics  . Smoking status: Passive Smoke Exposure - Never Smoker  . Smokeless tobacco: Not on file  . Alcohol Use: No    OB History   Grav Para Term Preterm Abortions TAB SAB Ect Mult Living                  Review of Systems  Eyes: Positive for pain and redness.  Neurological: Positive for headaches.    Allergies  Nsaids and Sulfonamide derivatives  Home Medications   Current Outpatient Rx  Name  Route  Sig  Dispense  Refill  . acetaminophen (TYLENOL) 500 MG tablet   Oral   Take 1,000 mg by mouth every 6 (six) hours as needed for pain.         Marland Kitchen glipiZIDE (GLUCOTROL) 10 MG tablet   Oral   Take 10 mg by mouth 2 (two) times daily before a meal.         . imipramine (TOFRANIL) 25 MG tablet   Oral   Take 100 mg by mouth at bedtime.         . insulin glargine (LANTUS SOLOSTAR) 100 UNIT/ML injection   Subcutaneous   Inject 40 Units  into the skin at bedtime.   10 mL   12     Dose increase effective 10/18/2011   . ketorolac (ACULAR) 0.5 % ophthalmic solution      1 drop 4 (four) times daily.         Marland Kitchen levothyroxine (SYNTHROID) 200 MCG tablet   Oral   Take 1 tablet (200 mcg total) by mouth daily.   30 tablet   0   . loratadine (CLARITIN) 10 MG tablet   Oral   Take 10 mg by mouth daily as needed for allergies.         . metFORMIN (GLUCOPHAGE) 1000 MG tablet   Oral   Take 1,000 mg by mouth 2 (two) times daily with a meal.         . metoprolol (LOPRESSOR) 50 MG tablet   Oral   Take 1 tablet (50 mg total) by mouth 2 (two) times daily.   60 tablet   5   . ofloxacin (OCUFLOX) 0.3 % ophthalmic solution      1 drop 4 (four) times daily.         . pravastatin (PRAVACHOL) 80 MG tablet   Oral   Take 80 mg by mouth at bedtime.         . prednisoLONE acetate (PRED FORTE) 1 % ophthalmic suspension      1 drop 2 (two) times daily.         . quinapril (ACCUPRIL) 10 MG  tablet   Oral   Take 10 mg by mouth daily.         . verapamil (CALAN-SR) 180 MG CR tablet   Oral   Take 180 mg by mouth at bedtime.         . nitroGLYCERIN (NITROSTAT) 0.4 MG SL tablet   Sublingual   Place 0.4 mg under the tongue every 5 (five) minutes as needed for chest pain.            BP 153/84  Pulse 74  Temp(Src) 98.5 F (36.9 C) (Oral)  Resp 18  SpO2 99%  Physical Exam  Nursing note and vitals reviewed. Constitutional: She is oriented to person, place, and time. She appears well-developed and well-nourished. No distress.  HENT:  Head: Normocephalic and atraumatic.  Eyes: EOM are normal. Right eye exhibits discharge ( Watery). Left eye exhibits no discharge.  Conjunctiva mildly injected in R eye. EOM normal. Pupil are asymmetrical, left greater than right but both are reactive. Watery discharge in R eye. Consensual pain with light reflex.   On fluorescein and tetracaine exam: no corneal abrasions.  On slit lamp exam: no cells or flare.   Intraocular pressure measurement twice, 16    Neck: Neck supple. No tracheal deviation present.  Cardiovascular: Normal rate, regular rhythm and normal heart sounds.   Pulmonary/Chest: Effort normal and breath sounds normal. No respiratory distress.  Neurological: She is alert and oriented to person, place, and time.  Skin: Skin is warm and dry.  Psychiatric: She has a normal mood and affect. Her behavior is normal.     ED Course  Procedures (including critical care time)  DIAGNOSTIC STUDIES: Oxygen Saturation is 99% on RA, normal by my interpretation.    COORDINATION OF CARE: 10:51 AM Discussed treatment plan with pt at bedside and pt agreed to plan.   Labs Reviewed - No data to display No results found.   1. Eye pain, right   2. Iritis       MDM  Patient has unilateral right eye with normal intraocular pressure and pain with consensual light reflex. Likely has an element of iritis there has been no  injury. She does report recently stopping all of her medications including topical anti-inflammatories and what sounds like a topical steroid.  The patient can followup with ophthalmology on Monday, medications given, see below   Meds given in ED:  Medications  oxyCODONE-acetaminophen (PERCOCET/ROXICET) 5-325 MG per tablet 1 tablet (1 tablet Oral Given 08/19/12 1055)  fluorescein 1 MG ophthalmic strip (  Given 08/19/12 1110)  tetracaine (PONTOCAINE) 0.5 % ophthalmic solution (  Given 08/19/12 1110)    Discharge Medication List as of 08/19/2012 11:34 AM          I personally performed the services described in this documentation, which was scribed in my presence. The recorded information has been reviewed and is accurate.      Vida Roller, MD 08/20/12 [phone]

## 2012-08-23 NOTE — Addendum Note (Signed)
Addended by: Kandis Fantasia B on: 08/23/2012 04:58 PM   Modules accepted: Orders

## 2012-08-31 ENCOUNTER — Ambulatory Visit (INDEPENDENT_AMBULATORY_CARE_PROVIDER_SITE_OTHER): Payer: Medicare Other | Admitting: Family Medicine

## 2012-08-31 ENCOUNTER — Encounter: Payer: Self-pay | Admitting: Family Medicine

## 2012-08-31 VITALS — BP 122/68 | HR 88 | Resp 18 | Ht 65.0 in | Wt 205.1 lb

## 2012-08-31 DIAGNOSIS — I10 Essential (primary) hypertension: Secondary | ICD-10-CM | POA: Diagnosis not present

## 2012-08-31 DIAGNOSIS — R609 Edema, unspecified: Secondary | ICD-10-CM | POA: Diagnosis not present

## 2012-08-31 DIAGNOSIS — IMO0001 Reserved for inherently not codable concepts without codable children: Secondary | ICD-10-CM

## 2012-08-31 DIAGNOSIS — R600 Localized edema: Secondary | ICD-10-CM

## 2012-08-31 DIAGNOSIS — E1165 Type 2 diabetes mellitus with hyperglycemia: Secondary | ICD-10-CM

## 2012-08-31 NOTE — Patient Instructions (Addendum)
F/u  As before.  Leg swelling is by history much improved and due to keeping legs down on long trip.  Use of compression hose and leg elevation will reduce this in the future  During hoot months, people also tend to retain fluid, so keep salt intake down  I suggest you get labs by Monday

## 2012-08-31 NOTE — Progress Notes (Signed)
  Subjective:    Patient ID: Jennifer Blackburn, female    DOB: Dec 09, 1945, 67 y.o.   MRN: [id number]  HPI Pt called in with concern of bilateral leg swelling, following prolonged riding trips in 2 successive days this past weekend. Feet in particular were swollen, she reports improved today. Was not wearing hose, denies PND, orthopnea, or exertional dyspnea. Reports improved blood sugars with fasting sugars approx 150, has f/u next week after labs   Review of Systems See HPI Denies recent fever or chills. Denies sinus pressure, nasal congestion, ear pain or sore throat. Denies chest congestion, productive cough or wheezing. Denies chest pains, palpitations  Denies abdominal pain, nausea, vomiting,diarrhea or constipation.   Denies dysuria, frequency, hesitancy or incontinence. Denies joint pain, swelling and limitation in mobility.  Denies skin break down or rash.        Objective:   Physical Exam Patient alert and oriented and in no cardiopulmonary distress.  HEENT: No facial asymmetry, EOMI, no sinus tenderness,  oropharynx pink and moist.  Neck supple no adenopathy.  Chest: Clear to auscultation bilaterally.  CVS: S1, S2 no murmurs, no S3.  ABD: Soft non tender. Bowel sounds normal.  Ext: Trace pitting  Edema bilaterally, primarily limited to the feet  MS: Adequate ROM spine, shoulders, hips and knees.  Skin: Intact, no ulcerations or rash noted.  Psych: Good eye contact, normal affect. Memory intact not anxious or depressed appearing.  CNS: CN 2-12 intact, power, tone and sensation normal throughout.        Assessment & Plan:

## 2012-09-01 ENCOUNTER — Other Ambulatory Visit: Payer: Self-pay | Admitting: Family Medicine

## 2012-09-02 DIAGNOSIS — H2000 Unspecified acute and subacute iridocyclitis: Secondary | ICD-10-CM | POA: Diagnosis not present

## 2012-09-02 DIAGNOSIS — R600 Localized edema: Secondary | ICD-10-CM | POA: Insufficient documentation

## 2012-09-02 NOTE — Assessment & Plan Note (Signed)
Patient advised to reduce carb and sweets, commit to regular physical activity, take meds as prescribed, test blood as directed, and attempt to lose weight, to improve blood sugar control. Updated lab for visit next week

## 2012-09-02 NOTE — Assessment & Plan Note (Signed)
No significant swelling on day of exam, swelling was likely aggravated by prolonged lower extremity dependency No symptoms of uncontrolled CHF, pt reassured. Advised to reduce sodium intake, wear compression hose when needed, and elevate legs as needed

## 2012-09-02 NOTE — Assessment & Plan Note (Signed)
Controlled, no change in medication DASH diet and commitment to daily physical activity for a minimum of 30 minutes discussed and encouraged, as a part of hypertension management. The importance of attaining a healthy weight is also discussed.  

## 2012-09-04 DIAGNOSIS — E785 Hyperlipidemia, unspecified: Secondary | ICD-10-CM | POA: Diagnosis not present

## 2012-09-04 DIAGNOSIS — IMO0001 Reserved for inherently not codable concepts without codable children: Secondary | ICD-10-CM | POA: Diagnosis not present

## 2012-09-04 LAB — COMPLETE METABOLIC PANEL WITH GFR
ALT: 17 U/L (ref 0–35)
AST: 12 U/L (ref 0–37)
Albumin: 4.1 g/dL (ref 3.5–5.2)
Alkaline Phosphatase: 37 U/L — ABNORMAL LOW (ref 39–117)
BUN: 11 mg/dL (ref 6–23)
CO2: 27 mEq/L (ref 19–32)
Calcium: 9 mg/dL (ref 8.4–10.5)
Chloride: 102 mEq/L (ref 96–112)
Creat: 0.7 mg/dL (ref 0.50–1.10)
GFR, Est African American: >89 mL/min
GFR, Est Non African American: >89 mL/min
Glucose, Bld: 165 mg/dL — ABNORMAL HIGH (ref 70–99)
Potassium: 4.4 mEq/L (ref 3.5–5.3)
Sodium: 142 mEq/L (ref 135–145)
Total Bilirubin: 0.3 mg/dL (ref 0.3–1.2)
Total Protein: 7.1 g/dL (ref 6.0–8.3)

## 2012-09-04 LAB — HEMOGLOBIN A1C
Hgb A1c MFr Bld: 8 % — ABNORMAL HIGH (ref <5.7)
Mean Plasma Glucose: 183 mg/dL — ABNORMAL HIGH (ref <117)

## 2012-09-04 LAB — LIPID PANEL
Cholesterol: 171 mg/dL (ref 0–200)
HDL: 42 mg/dL (ref >39)
LDL Cholesterol: 108 mg/dL — ABNORMAL HIGH (ref 0–99)
Total CHOL/HDL Ratio: 4.1 Ratio
Triglycerides: 106 mg/dL (ref <150)
VLDL: 21 mg/dL (ref 0–40)

## 2012-09-06 ENCOUNTER — Encounter: Payer: Self-pay | Admitting: Cardiovascular Disease

## 2012-09-06 ENCOUNTER — Ambulatory Visit (INDEPENDENT_AMBULATORY_CARE_PROVIDER_SITE_OTHER): Payer: Medicare Other | Admitting: Cardiovascular Disease

## 2012-09-06 VITALS — BP 140/76 | HR 72 | Ht 65.0 in | Wt 202.7 lb

## 2012-09-06 DIAGNOSIS — I10 Essential (primary) hypertension: Secondary | ICD-10-CM | POA: Diagnosis not present

## 2012-09-06 DIAGNOSIS — E785 Hyperlipidemia, unspecified: Secondary | ICD-10-CM | POA: Diagnosis not present

## 2012-09-06 DIAGNOSIS — I2510 Atherosclerotic heart disease of native coronary artery without angina pectoris: Secondary | ICD-10-CM

## 2012-09-06 DIAGNOSIS — R0789 Other chest pain: Secondary | ICD-10-CM | POA: Diagnosis not present

## 2012-09-06 MED ORDER — NITROGLYCERIN 0.4 MG SL SUBL: 0.4000 mg | SUBLINGUAL_TABLET | SUBLINGUAL | Status: DC | PRN

## 2012-09-06 NOTE — Progress Notes (Signed)
09/06/2012 Jennifer Blackburn   December 10, 1945  [id number]  Primary Physician Syliva Overman, MD Primary Cardiologist: Runell Gess MD [name]   HPI:  The patient is a 67 year old, moderately overweight, married [place] American female, mother of 2, grandmother to 4 grandchildren who I last saw in the office 6 months ago. She has a history of minimal CAD by cath back in June 2005 with normal LV function. Her other problems include treated hypertension, diabetes and dyslipidemia. She denied any chest pain or shortness of breath. She does have a strong family history of heart disease with a father that died of an MI at age 36. Most recent lipid profile performed by Dr. Lodema Hong revealed a total cholesterol of 235, LDL of 161, HDL 45. Based on this, Dr. Lodema Hong restarted her on a statin drug (pravastatin 40.) and Dr. Lodema Hong continues to follow her with profile. I last saw her 11/04/11. She's been stable since that time. She complains of rare atypical positional nocturnal chest pain.     Current Outpatient Prescriptions  Medication Sig Dispense Refill  . acetaminophen (TYLENOL) 500 MG tablet Take 1,000 mg by mouth every 6 (six) hours as needed for pain.      Marland Kitchen glipiZIDE (GLUCOTROL) 10 MG tablet Take 10 mg by mouth 2 (two) times daily before a meal.      . imipramine (TOFRANIL) 25 MG tablet Take 100 mg by mouth at bedtime.      . insulin glargine (LANTUS SOLOSTAR) 100 UNIT/ML injection Inject 40 Units into the skin at bedtime.  10 mL  12  . levothyroxine (SYNTHROID) 200 MCG tablet Take 1 tablet (200 mcg total) by mouth daily.  30 tablet  0  . loratadine (CLARITIN) 10 MG tablet Take 10 mg by mouth daily as needed for allergies.      . metFORMIN (GLUCOPHAGE) 1000 MG tablet Take 1,000 mg by mouth 2 (two) times daily with a meal.      . metoprolol (LOPRESSOR) 50 MG tablet Take 1 tablet (50 mg total) by mouth 2 (two) times daily.  60 tablet  5  . nitroGLYCERIN (NITROSTAT) 0.4 MG SL  tablet Place 0.4 mg under the tongue every 5 (five) minutes as needed for chest pain.       . pravastatin (PRAVACHOL) 80 MG tablet Take 80 mg by mouth at bedtime.      . prednisoLONE acetate (PRED FORTE) 1 % ophthalmic suspension 1 drop 4 (four) times daily.       . quinapril (ACCUPRIL) 10 MG tablet TAKE 1 TABLET BY MOUTH EVERY DAY  30 tablet  2  . verapamil (CALAN-SR) 180 MG CR tablet Take 180 mg by mouth at bedtime.       No current facility-administered medications for this visit.    Allergies  Allergen Reactions  . Nsaids Hives  . Sulfonamide Derivatives Hives    History   Social History  . Marital Status: Married    Spouse Name: N/A    Number of Children: N/A  . Years of Education: N/A   Occupational History  . retired     Social History Main Topics  . Smoking status: Passive Smoke Exposure - Never Smoker  . Smokeless tobacco: Never Used  . Alcohol Use: No  . Drug Use: No  . Sexually Active: Not on file   Other Topics Concern  . Not on file   Social History Narrative  . No narrative on file     Review of Systems:  General: negative for chills, fever, night sweats or weight changes.  Cardiovascular: negative for chest pain, dyspnea on exertion, edema, orthopnea, palpitations, paroxysmal nocturnal dyspnea or shortness of breath Dermatological: negative for rash Respiratory: negative for cough or wheezing Urologic: negative for hematuria Abdominal: negative for nausea, vomiting, diarrhea, bright red blood per rectum, melena, or hematemesis Neurologic: negative for visual changes, syncope, or dizziness All other systems reviewed and are otherwise negative except as noted above.    Blood pressure 140/76, pulse 72, height 5\' 5"  (1.651 m), weight 202 lb 11.2 oz (91.944 kg).  General appearance: alert and no distress Neck: no adenopathy, no carotid bruit, no JVD, supple, symmetrical, trachea midline and thyroid not enlarged, symmetric, no  tenderness/mass/nodules Lungs: clear to auscultation bilaterally Heart: regular rate and rhythm, S1, S2 normal, no murmur, click, rub or gallop Extremities: extremities normal, atraumatic, no cyanosis or edema  EKG normal sinus rhythm at 72 without ST or T wave changes  ASSESSMENT AND PLAN:   ESSENTIAL HYPERTENSION, BENIGN Controlled on appropriate medications  HYPERLIPIDEMIA On statin drug followed by Dr. Lodema Hong  Atypical chest pain The pain sounds musculoskeletal and positional. She did have a heart catheterization performed June 2005 showed minimal CAD and normal LV function.      Runell Gess MD FACP,FACC,FAHA, [facility] 09/06/2012 11:55 AM

## 2012-09-06 NOTE — Assessment & Plan Note (Signed)
Controlled on appropriate medications 

## 2012-09-06 NOTE — Assessment & Plan Note (Signed)
The pain sounds musculoskeletal and positional. She did have a heart catheterization performed June 2005 showed minimal CAD and normal LV function.

## 2012-09-06 NOTE — Assessment & Plan Note (Signed)
On statin drug followed by Dr. Lodema Hong

## 2012-09-06 NOTE — Patient Instructions (Addendum)
Your physician recommends that you schedule a follow-up appointment in: 1 year  We have sent a refill for your Nitroglycerin to you pharmacy.

## 2012-09-07 ENCOUNTER — Ambulatory Visit (INDEPENDENT_AMBULATORY_CARE_PROVIDER_SITE_OTHER): Payer: Medicare Other | Admitting: Family Medicine

## 2012-09-07 ENCOUNTER — Ambulatory Visit: Payer: Medicare Other | Admitting: Family Medicine

## 2012-09-07 ENCOUNTER — Encounter: Payer: Self-pay | Admitting: Family Medicine

## 2012-09-07 VITALS — BP 132/70 | HR 87 | Resp 16 | Ht 65.0 in | Wt 205.4 lb

## 2012-09-07 DIAGNOSIS — I10 Essential (primary) hypertension: Secondary | ICD-10-CM | POA: Diagnosis not present

## 2012-09-07 DIAGNOSIS — E1065 Type 1 diabetes mellitus with hyperglycemia: Secondary | ICD-10-CM | POA: Diagnosis not present

## 2012-09-07 DIAGNOSIS — E669 Obesity, unspecified: Secondary | ICD-10-CM

## 2012-09-07 DIAGNOSIS — E785 Hyperlipidemia, unspecified: Secondary | ICD-10-CM

## 2012-09-07 DIAGNOSIS — IMO0001 Reserved for inherently not codable concepts without codable children: Secondary | ICD-10-CM

## 2012-09-07 MED ORDER — INSULIN GLARGINE 100 UNIT/ML SOLOSTAR PEN: PEN_INJECTOR | SUBCUTANEOUS | Status: DC

## 2012-09-07 NOTE — Assessment & Plan Note (Signed)
Controlled, no change in medication DASH diet and commitment to daily physical activity for a minimum of 30 minutes discussed and encouraged, as a part of hypertension management. The importance of attaining a healthy weight is also discussed.  

## 2012-09-07 NOTE — Assessment & Plan Note (Signed)
Uncontrolled, lDL elevated  Hyperlipidemia:Low fat diet discussed and encouraged.

## 2012-09-07 NOTE — Progress Notes (Signed)
  Subjective:    Patient ID: Jennifer Blackburn, female    DOB: [birth year], 67 y.o.   MRN: [id number]  HPI The PT is here for follow up and re-evaluation of chronic medical conditions, medication management and review of any available recent lab and radiology data.  Preventive health is updated, specifically  Cancer screening and Immunization.   Questions or concerns regarding consultations or procedures which the PT has had in the interim are  Addressed.Recently seen by cardiology and cleared for 1 year The PT denies any adverse reactions to current medications since the last visit.  There are no new concerns. Still not diligently with exercise and eating, but has worked on her blood sugar which ha improved There are no specific complaints       Review of Systems See HPI Denies recent fever or chills. Denies sinus pressure, nasal congestion, ear pain or sore throat. Denies chest congestion, productive cough or wheezing. Denies chest pains, palpitations and leg swelling Denies abdominal pain, nausea, vomiting,diarrhea or constipation.   Denies dysuria, frequency, hesitancy or incontinence. C/o some hip stiffness Denies headaches, seizures, numbness, or tingling. Denies depression, anxiety or insomnia. Denies skin break down or rash.        Objective:   Physical Exam  Patient alert and oriented and in no cardiopulmonary distress.  HEENT: No facial asymmetry, EOMI, no sinus tenderness,  oropharynx pink and moist.  Neck supple no adenopathy.  Chest: Clear to auscultation bilaterally.  CVS: S1, S2 no murmurs, no S3.  ABD: Soft non tender. Bowel sounds normal.  Ext: No edema  MS: Adequate ROM spine, shoulders, hips and knees.  Skin: Intact, no ulcerations or rash noted.  Psych: Good eye contact, normal affect. Memory intact not anxious or depressed appearing.  CNS: CN 2-12 intact, power, tone and sensation normal throughout.       Assessment & Plan:

## 2012-09-07 NOTE — Patient Instructions (Addendum)
F/u in mid October, call if you need me before  CONGRATS on marked improvement in your blood sugar  Please work on daily bike riding for 30  Minutes  Please continue to eat and drink  with thought, avoiding sugar most of the time  Increase lantus to 45 units daily, script will be written for 50 units daily  Goal for fasting blood sugar ranges from 80 to 120 and 2 hours after any meal or at bedtime should be between 130 to 170.    Please cut back on fied and fatty foods and red meat, egg yolks cheese and butter, bad cholesterol is slightly higher than it should be  Non fasting chem 7 and HBa1C in mid October  Goal for hBa1C is 7, you are 8  Pneumonia vaccine today  You do need the shingles vaccine, check with your pharmacy and please get it there when able

## 2012-09-07 NOTE — Assessment & Plan Note (Addendum)
Improved , incrase to 45 units daily, and follow carb restricted diet more faithful and commit to daily bike riding  Patient advised to reduce carb and sweets, commit to regular physical activity, take meds as prescribed, test blood as directed, and attempt to lose weight, to improve blood sugar control.

## 2012-09-07 NOTE — Assessment & Plan Note (Signed)
Unchanged. Patient re-educated about  the importance of commitment to a  minimum of 150 minutes of exercise per week. The importance of healthy food choices with portion control discussed. Encouraged to start a food diary, count calories and to consider  joining a support group. Sample diet sheets offered. Goals set by the patient for the next several months.    

## 2012-09-08 DIAGNOSIS — H4011X0 Primary open-angle glaucoma, stage unspecified: Secondary | ICD-10-CM | POA: Diagnosis not present

## 2012-10-02 ENCOUNTER — Other Ambulatory Visit: Payer: Self-pay | Admitting: Family Medicine

## 2012-10-23 DIAGNOSIS — Z961 Presence of intraocular lens: Secondary | ICD-10-CM | POA: Diagnosis not present

## 2012-10-23 DIAGNOSIS — H20029 Recurrent acute iridocyclitis, unspecified eye: Secondary | ICD-10-CM | POA: Diagnosis not present

## 2012-11-06 ENCOUNTER — Other Ambulatory Visit: Payer: Self-pay | Admitting: Family Medicine

## 2012-11-28 ENCOUNTER — Ambulatory Visit (INDEPENDENT_AMBULATORY_CARE_PROVIDER_SITE_OTHER): Payer: Medicare Other | Admitting: Family Medicine

## 2012-11-28 ENCOUNTER — Encounter: Payer: Self-pay | Admitting: Family Medicine

## 2012-11-28 VITALS — BP 142/74 | HR 80 | Resp 18 | Ht 65.0 in | Wt 207.1 lb

## 2012-11-28 DIAGNOSIS — F341 Dysthymic disorder: Secondary | ICD-10-CM

## 2012-11-28 DIAGNOSIS — J329 Chronic sinusitis, unspecified: Secondary | ICD-10-CM | POA: Diagnosis not present

## 2012-11-28 DIAGNOSIS — M479 Spondylosis, unspecified: Secondary | ICD-10-CM | POA: Diagnosis not present

## 2012-11-28 DIAGNOSIS — M5410 Radiculopathy, site unspecified: Secondary | ICD-10-CM | POA: Insufficient documentation

## 2012-11-28 DIAGNOSIS — F329 Major depressive disorder, single episode, unspecified: Secondary | ICD-10-CM

## 2012-11-28 DIAGNOSIS — Z23 Encounter for immunization: Secondary | ICD-10-CM

## 2012-11-28 DIAGNOSIS — E1065 Type 1 diabetes mellitus with hyperglycemia: Secondary | ICD-10-CM

## 2012-11-28 DIAGNOSIS — M549 Dorsalgia, unspecified: Secondary | ICD-10-CM | POA: Diagnosis not present

## 2012-11-28 DIAGNOSIS — F32A Depression, unspecified: Secondary | ICD-10-CM

## 2012-11-28 DIAGNOSIS — IMO0001 Reserved for inherently not codable concepts without codable children: Secondary | ICD-10-CM

## 2012-11-28 MED ORDER — AZITHROMYCIN 250 MG PO TABS: ORAL_TABLET | ORAL | Status: AC

## 2012-11-28 MED ORDER — PAROXETINE HCL 10 MG PO TABS: 10.0000 mg | ORAL_TABLET | ORAL | Status: DC

## 2012-11-28 NOTE — Progress Notes (Signed)
  Subjective:    Patient ID: Jennifer Blackburn, female    DOB: 06/06/1945, 67 y.o.   MRN: [id number]  HPI On August 9 pt unable to walk due to 10 plus left   lower extremity pain, had to use a cane for 1 week , pain still persits   Bilateral sticky eyes and bitemporal pressure x 4 days.No fever, has ahd intermittent chills, no sore throat or productive cough Has had left hip replaced, denies any limitation in hip or significant hip pain. Has ahd c spine surgery in the past Reports fairly good blood suagrs  Review of Systems See HPI Denies chest congestion, productive cough or wheezing. Denies chest pains, palpitations and leg swelling Denies abdominal pain, nausea, vomiting,diarrhea or constipation.   Denies dysuria, frequency, hesitancy or incontinence.  Denies headaches, seizures, numbness, or tingling. Denies uncontrolled depression, anxiety or insomnia. Denies skin break down or rash.        Objective:   Physical Exam  Patient alert and oriented and in no cardiopulmonary distress.  HEENT: No facial asymmetry, EOMI, frontal sinus tenderness,  oropharynx pink and moist.  Neck supple no adenopathy.  Chest: Clear to auscultation bilaterally.  CVS: S1, S2 no murmurs, no S3.  ABD: Soft non tender. Bowel sounds normal.  Ext: No edema  MS: Decreased  ROM lumbar  Spine,adeqaute in  shoulders, hips and knees.  Skin: Intact, no ulcerations or rash noted.  Psych: Good eye contact, normal affect. Memory intact not anxious or depressed appearing.  CNS: CN 2-12 intact, power, tone and sensation normal throughout.       Assessment & Plan:

## 2012-11-28 NOTE — Patient Instructions (Addendum)
F/u as before.  z pack prescribed, if you continue to have eye  Symptoms pls contact eye specialist  You are referred for an MRI of your low back  Flu vaccine today

## 2012-12-01 ENCOUNTER — Ambulatory Visit (HOSPITAL_COMMUNITY)
Admission: RE | Admit: 2012-12-01 | Discharge: 2012-12-01 | Disposition: A | Discharge status: Home / Self Care | Payer: Medicare Other | Source: Ambulatory Visit | Attending: Family Medicine | Admitting: Family Medicine

## 2012-12-01 ENCOUNTER — Encounter (HOSPITAL_COMMUNITY): Payer: Self-pay

## 2012-12-01 DIAGNOSIS — M79609 Pain in unspecified limb: Secondary | ICD-10-CM | POA: Diagnosis not present

## 2012-12-01 DIAGNOSIS — M545 Low back pain, unspecified: Secondary | ICD-10-CM | POA: Insufficient documentation

## 2012-12-01 DIAGNOSIS — M5137 Other intervertebral disc degeneration, lumbosacral region: Secondary | ICD-10-CM | POA: Insufficient documentation

## 2012-12-01 DIAGNOSIS — M47817 Spondylosis without myelopathy or radiculopathy, lumbosacral region: Secondary | ICD-10-CM | POA: Insufficient documentation

## 2012-12-01 DIAGNOSIS — M51379 Other intervertebral disc degeneration, lumbosacral region without mention of lumbar back pain or lower extremity pain: Secondary | ICD-10-CM | POA: Insufficient documentation

## 2012-12-01 DIAGNOSIS — M549 Dorsalgia, unspecified: Secondary | ICD-10-CM

## 2012-12-03 ENCOUNTER — Other Ambulatory Visit: Payer: Self-pay | Admitting: Family Medicine

## 2012-12-03 DIAGNOSIS — M549 Dorsalgia, unspecified: Secondary | ICD-10-CM

## 2012-12-03 NOTE — Assessment & Plan Note (Signed)
Improved on medication refill prescribed

## 2012-12-03 NOTE — Assessment & Plan Note (Signed)
Patient educated about the importance of limiting  Carbohydrate intake , the need to commit to daily physical activity for a minimum of 30 minutes , and to commit weight loss. The fact that changes in all these areas will reduce or eliminate all together the development of diabetes is stressed.   Updated lab for next visit 

## 2012-12-03 NOTE — Assessment & Plan Note (Signed)
Worsened pain symptoms, uncontrolled with left lower extremity pain and weakness, needs rept MRI, I believe there has been  Disease progression and she may need surgery

## 2012-12-03 NOTE — Assessment & Plan Note (Signed)
6 week h/o left lower ext pain with limitation in mobility. Left hip exam is normal. Refer for MRI, already has established disease in lumbar spine

## 2012-12-03 NOTE — Assessment & Plan Note (Signed)
New onset frontal and bitemporal pressure with c/o stick eyes, has successfully completed topical steroids for eye inflammation following cataract surgery, will treat with z pack, pt to see optalmologist if symptoms continue

## 2012-12-07 DIAGNOSIS — H20029 Recurrent acute iridocyclitis, unspecified eye: Secondary | ICD-10-CM | POA: Diagnosis not present

## 2012-12-07 DIAGNOSIS — Z961 Presence of intraocular lens: Secondary | ICD-10-CM | POA: Diagnosis not present

## 2012-12-08 ENCOUNTER — Other Ambulatory Visit: Payer: Self-pay | Admitting: Family Medicine

## 2012-12-12 ENCOUNTER — Other Ambulatory Visit: Payer: Self-pay | Admitting: Cardiovascular Disease

## 2012-12-13 NOTE — Telephone Encounter (Signed)
Rx was sent to pharmacy electronically. 

## 2012-12-14 DIAGNOSIS — C73 Malignant neoplasm of thyroid gland: Secondary | ICD-10-CM | POA: Diagnosis not present

## 2012-12-14 DIAGNOSIS — E1159 Type 2 diabetes mellitus with other circulatory complications: Secondary | ICD-10-CM | POA: Diagnosis not present

## 2012-12-14 DIAGNOSIS — E785 Hyperlipidemia, unspecified: Secondary | ICD-10-CM | POA: Diagnosis not present

## 2012-12-14 DIAGNOSIS — I10 Essential (primary) hypertension: Secondary | ICD-10-CM | POA: Diagnosis not present

## 2012-12-21 DIAGNOSIS — E890 Postprocedural hypothyroidism: Secondary | ICD-10-CM | POA: Diagnosis not present

## 2012-12-21 DIAGNOSIS — C73 Malignant neoplasm of thyroid gland: Secondary | ICD-10-CM | POA: Diagnosis not present

## 2012-12-21 DIAGNOSIS — E782 Mixed hyperlipidemia: Secondary | ICD-10-CM | POA: Diagnosis not present

## 2012-12-21 DIAGNOSIS — D509 Iron deficiency anemia, unspecified: Secondary | ICD-10-CM | POA: Diagnosis not present

## 2012-12-21 DIAGNOSIS — I10 Essential (primary) hypertension: Secondary | ICD-10-CM | POA: Diagnosis not present

## 2012-12-21 DIAGNOSIS — E1165 Type 2 diabetes mellitus with hyperglycemia: Secondary | ICD-10-CM | POA: Diagnosis not present

## 2013-01-05 ENCOUNTER — Encounter: Payer: Self-pay | Admitting: Family Medicine

## 2013-01-05 ENCOUNTER — Ambulatory Visit (INDEPENDENT_AMBULATORY_CARE_PROVIDER_SITE_OTHER): Payer: Medicare Other | Admitting: Family Medicine

## 2013-01-05 VITALS — BP 110/68 | HR 78 | Resp 16 | Wt 197.0 lb

## 2013-01-05 DIAGNOSIS — E1065 Type 1 diabetes mellitus with hyperglycemia: Secondary | ICD-10-CM | POA: Diagnosis not present

## 2013-01-05 DIAGNOSIS — E039 Hypothyroidism, unspecified: Secondary | ICD-10-CM | POA: Diagnosis not present

## 2013-01-05 DIAGNOSIS — R51 Headache: Secondary | ICD-10-CM

## 2013-01-05 DIAGNOSIS — Z13820 Encounter for screening for osteoporosis: Secondary | ICD-10-CM

## 2013-01-05 DIAGNOSIS — IMO0001 Reserved for inherently not codable concepts without codable children: Secondary | ICD-10-CM

## 2013-01-05 DIAGNOSIS — E559 Vitamin D deficiency, unspecified: Secondary | ICD-10-CM

## 2013-01-05 DIAGNOSIS — E890 Postprocedural hypothyroidism: Secondary | ICD-10-CM | POA: Insufficient documentation

## 2013-01-05 DIAGNOSIS — R0609 Other forms of dyspnea: Secondary | ICD-10-CM | POA: Diagnosis not present

## 2013-01-05 DIAGNOSIS — F341 Dysthymic disorder: Secondary | ICD-10-CM

## 2013-01-05 DIAGNOSIS — I10 Essential (primary) hypertension: Secondary | ICD-10-CM

## 2013-01-05 DIAGNOSIS — E785 Hyperlipidemia, unspecified: Secondary | ICD-10-CM

## 2013-01-05 DIAGNOSIS — R0683 Snoring: Secondary | ICD-10-CM | POA: Insufficient documentation

## 2013-01-05 DIAGNOSIS — E669 Obesity, unspecified: Secondary | ICD-10-CM

## 2013-01-05 DIAGNOSIS — R5381 Other malaise: Secondary | ICD-10-CM

## 2013-01-05 DIAGNOSIS — F329 Major depressive disorder, single episode, unspecified: Secondary | ICD-10-CM

## 2013-01-05 DIAGNOSIS — F32A Depression, unspecified: Secondary | ICD-10-CM

## 2013-01-05 NOTE — Assessment & Plan Note (Signed)
Controlled, no change in medication DASH diet and commitment to daily physical activity for a minimum of 30 minutes discussed and encouraged, as a part of hypertension management. The importance of attaining a healthy weight is also discussed.  

## 2013-01-05 NOTE — Assessment & Plan Note (Signed)
Updated lab next visit and encouraged to take daily calcium with D supplement and continue regular exercise for bone health Referred for osteoperosis screen

## 2013-01-05 NOTE — Assessment & Plan Note (Signed)
Recent deterioration per pt , due to stopping the statin She has now resumed, importance of same stressed Hyperlipidemia:Low fat diet discussed and encouraged.  Lab next visit

## 2013-01-05 NOTE — Assessment & Plan Note (Signed)
Improved. Pt applauded on succesful weight loss through lifestyle change, and encouraged to continue same. Weight loss goal set for the next several months.  

## 2013-01-05 NOTE — Assessment & Plan Note (Signed)
Recent dose increase in med by endo, with close f/u

## 2013-01-05 NOTE — Assessment & Plan Note (Signed)
Deteriorated. Re educated re compliance with diet and medication as well as consistent testing Also again encouraged to call in with concerns Patient advised to reduce carb and sweets, commit to regular physical activity, take meds as prescribed, test blood as directed, and attempt to lose weight, to improve blood sugar control.

## 2013-01-05 NOTE — Assessment & Plan Note (Signed)
Snoring with chronic fatigue, pt referred to pulmonary for sleep study. She does have a small oropharyngeal airway

## 2013-01-05 NOTE — Patient Instructions (Addendum)
F/u in early January, call if you need me before  Pleae remember that drinking sugar is the same as eating it.  Congrats on regular exercise and 10 pound weight loss.  Important to take cholesterol med and also to increase lantus dose if needed as we discussed  You are referred to Dr Maple Hudson for evaluation of sleep apnea   Fasting lipid cmp and EGFr and HBA1C in January before visit, and vit D  Call with any concerns about blood sugar  You are referred for osteoperosis screen

## 2013-01-05 NOTE — Assessment & Plan Note (Signed)
Controlled, no change in medication  

## 2013-01-05 NOTE — Progress Notes (Signed)
  Subjective:    Patient ID: Jennifer Blackburn, female    DOB: 24-Mar-1945, 67 y.o.   MRN: [id number]  HPI The PT is here for follow up and re-evaluation of chronic medical conditions, medication management and review of any available recent lab and radiology data.  Preventive health is updated, specifically  Cancer screening and Immunization.   Questions or concerns regarding consultations or procedures which the PT has had in the interim are  Addressed.Recent endo follow up is concerning for recurrent disease per pt.has closer follow up with Md than usual and also has had an up titration in her dose of thyroid med. States the her diet has consisted of too much sweet drinks, in retrospect, and also her glucometer was broken. Was surprised to learn of a hBA1C value of 8.2 when she was recently at her endo appt. Her cholesterol had also increased as pt had stopped her statin , states she thought it was causing joint pains, but after talking with endo, she decided to resume this also Has upcoming with neurosurgery about her back States recently comited to walking daily again, and now drinks sugar free liquid only. C/o chronic fatigue despite an 8 hour sleep average. Spouse reports her to be a snorrer Depression and anxiety controlled on current dose of paxil States she tests twice daily and fasting sugars this past week are between 125 and 135. I again reviewed titrating up  Every 3 days if needed, currently takes 40 units lantus, script is for 50 units        Review of Systems See HPI Denies recent fever or chills. Denies sinus pressure, nasal congestion, ear pain or sore throat. Denies chest congestion, productive cough or wheezing. Denies chest pains, palpitations and leg swelling Denies abdominal pain, nausea, vomiting,diarrhea or constipation.   Denies dysuria, frequency, hesitancy or incontinence. Chronic back pain, plans to start chondroitin and has upcoming neurosurg appt Denies  uncontrolled  headaches, seizures, numbness, or tingling. Denies uncontrolled  depression, anxiety or insomnia. Denies skin break down or rash.        Objective:   Physical Exam  Patient alert and oriented and in no cardiopulmonary distress.  HEENT: No facial asymmetry, EOMI, no sinus tenderness,  oropharynx pink and moist.  Neck supple no adenopathy.  Chest: Clear to auscultation bilaterally.  CVS: S1, S2 no murmurs, no S3.  ABD: Soft non tender. Bowel sounds normal.  Ext: No edema  MS: Adequate ROM spine, shoulders, hips and knees.  Skin: Intact, no ulcerations or rash noted.  Psych: Good eye contact, normal affect. Memory intact not anxious or depressed appearing.  CNS: CN 2-12 intact, power, tone and sensation normal throughout.       Assessment & Plan:

## 2013-01-05 NOTE — Assessment & Plan Note (Signed)
Controlled with imipramine prophylaxis, continue same

## 2013-01-08 ENCOUNTER — Telehealth: Payer: Self-pay | Admitting: Family Medicine

## 2013-01-08 DIAGNOSIS — Z961 Presence of intraocular lens: Secondary | ICD-10-CM | POA: Diagnosis not present

## 2013-01-08 DIAGNOSIS — H20029 Recurrent acute iridocyclitis, unspecified eye: Secondary | ICD-10-CM | POA: Diagnosis not present

## 2013-01-08 NOTE — Telephone Encounter (Signed)
After she got the flu shot she started feeling bad, dry cough, weakness , chills (NO fever) Advised robitussin DM and Sudafed if she develops nasal congestion and to call back in a couple days if worse or no better

## 2013-01-11 DIAGNOSIS — Z85850 Personal history of malignant neoplasm of thyroid: Secondary | ICD-10-CM | POA: Diagnosis not present

## 2013-01-11 DIAGNOSIS — E039 Hypothyroidism, unspecified: Secondary | ICD-10-CM | POA: Diagnosis not present

## 2013-01-11 DIAGNOSIS — M2669 Other specified disorders of temporomandibular joint: Secondary | ICD-10-CM | POA: Diagnosis not present

## 2013-01-17 DIAGNOSIS — E663 Overweight: Secondary | ICD-10-CM | POA: Diagnosis not present

## 2013-01-17 DIAGNOSIS — M4310 Spondylolisthesis, site unspecified: Secondary | ICD-10-CM | POA: Diagnosis not present

## 2013-01-17 DIAGNOSIS — M545 Low back pain, unspecified: Secondary | ICD-10-CM | POA: Diagnosis not present

## 2013-01-17 DIAGNOSIS — M48061 Spinal stenosis, lumbar region without neurogenic claudication: Secondary | ICD-10-CM | POA: Diagnosis not present

## 2013-01-31 ENCOUNTER — Other Ambulatory Visit (HOSPITAL_COMMUNITY): Payer: Self-pay

## 2013-02-06 DIAGNOSIS — M545 Low back pain, unspecified: Secondary | ICD-10-CM | POA: Diagnosis not present

## 2013-02-06 DIAGNOSIS — C73 Malignant neoplasm of thyroid gland: Secondary | ICD-10-CM | POA: Diagnosis not present

## 2013-02-06 DIAGNOSIS — IMO0002 Reserved for concepts with insufficient information to code with codable children: Secondary | ICD-10-CM | POA: Diagnosis not present

## 2013-02-06 DIAGNOSIS — M48061 Spinal stenosis, lumbar region without neurogenic claudication: Secondary | ICD-10-CM | POA: Diagnosis not present

## 2013-02-12 DIAGNOSIS — H20029 Recurrent acute iridocyclitis, unspecified eye: Secondary | ICD-10-CM | POA: Diagnosis not present

## 2013-02-12 DIAGNOSIS — Z961 Presence of intraocular lens: Secondary | ICD-10-CM | POA: Diagnosis not present

## 2013-02-13 DIAGNOSIS — IMO0002 Reserved for concepts with insufficient information to code with codable children: Secondary | ICD-10-CM | POA: Diagnosis not present

## 2013-02-13 DIAGNOSIS — E782 Mixed hyperlipidemia: Secondary | ICD-10-CM | POA: Diagnosis not present

## 2013-02-13 DIAGNOSIS — E1165 Type 2 diabetes mellitus with hyperglycemia: Secondary | ICD-10-CM | POA: Diagnosis not present

## 2013-02-13 DIAGNOSIS — C73 Malignant neoplasm of thyroid gland: Secondary | ICD-10-CM | POA: Diagnosis not present

## 2013-02-13 DIAGNOSIS — I10 Essential (primary) hypertension: Secondary | ICD-10-CM | POA: Diagnosis not present

## 2013-02-13 DIAGNOSIS — E890 Postprocedural hypothyroidism: Secondary | ICD-10-CM | POA: Diagnosis not present

## 2013-02-19 ENCOUNTER — Institutional Professional Consult (permissible substitution): Payer: Self-pay | Admitting: Pulmonary Disease

## 2013-02-19 DIAGNOSIS — M47817 Spondylosis without myelopathy or radiculopathy, lumbosacral region: Secondary | ICD-10-CM | POA: Diagnosis not present

## 2013-02-19 DIAGNOSIS — M545 Low back pain, unspecified: Secondary | ICD-10-CM | POA: Diagnosis not present

## 2013-02-19 DIAGNOSIS — M48061 Spinal stenosis, lumbar region without neurogenic claudication: Secondary | ICD-10-CM | POA: Diagnosis not present

## 2013-02-28 ENCOUNTER — Other Ambulatory Visit: Payer: Self-pay | Admitting: Family Medicine

## 2013-02-28 ENCOUNTER — Other Ambulatory Visit: Payer: Self-pay | Admitting: Cardiovascular Disease

## 2013-02-28 NOTE — Telephone Encounter (Signed)
Rx was sent to pharmacy electronically. 

## 2013-03-13 ENCOUNTER — Institutional Professional Consult (permissible substitution): Payer: Self-pay | Admitting: Pulmonary Disease

## 2013-03-23 ENCOUNTER — Ambulatory Visit: Payer: Medicare Other | Admitting: Family Medicine

## 2013-03-26 DIAGNOSIS — L680 Hirsutism: Secondary | ICD-10-CM | POA: Diagnosis not present

## 2013-03-26 DIAGNOSIS — L738 Other specified follicular disorders: Secondary | ICD-10-CM | POA: Diagnosis not present

## 2013-03-26 DIAGNOSIS — D1801 Hemangioma of skin and subcutaneous tissue: Secondary | ICD-10-CM | POA: Diagnosis not present

## 2013-03-26 DIAGNOSIS — L739 Follicular disorder, unspecified: Secondary | ICD-10-CM | POA: Diagnosis not present

## 2013-04-04 ENCOUNTER — Other Ambulatory Visit: Payer: Self-pay | Admitting: Family Medicine

## 2013-04-16 ENCOUNTER — Other Ambulatory Visit: Payer: Self-pay | Admitting: Family Medicine

## 2013-04-20 ENCOUNTER — Other Ambulatory Visit: Payer: Self-pay | Admitting: Family Medicine

## 2013-04-25 ENCOUNTER — Telehealth: Payer: Self-pay | Admitting: Family Medicine

## 2013-04-25 NOTE — Telephone Encounter (Signed)
Called pt re uncontrolled diabetes and elevated cholesterol. States she has lab sheet, will get blood drawn and make appt . Io believe she may need to see endo re blood sugar, will discuss at visit, her September labs were not good

## 2013-05-01 DIAGNOSIS — E1065 Type 1 diabetes mellitus with hyperglycemia: Secondary | ICD-10-CM | POA: Diagnosis not present

## 2013-05-01 DIAGNOSIS — E785 Hyperlipidemia, unspecified: Secondary | ICD-10-CM | POA: Diagnosis not present

## 2013-05-01 DIAGNOSIS — E559 Vitamin D deficiency, unspecified: Secondary | ICD-10-CM | POA: Diagnosis not present

## 2013-05-01 DIAGNOSIS — IMO0002 Reserved for concepts with insufficient information to code with codable children: Secondary | ICD-10-CM | POA: Diagnosis not present

## 2013-05-01 LAB — LIPID PANEL
Cholesterol: 165 mg/dL (ref 0–200)
HDL: 43 mg/dL (ref >39)
LDL Cholesterol: 95 mg/dL (ref 0–99)
Total CHOL/HDL Ratio: 3.8 Ratio
Triglycerides: 133 mg/dL (ref <150)
VLDL: 27 mg/dL (ref 0–40)

## 2013-05-01 LAB — COMPLETE METABOLIC PANEL WITH GFR
ALT: 19 U/L (ref 0–35)
AST: 18 U/L (ref 0–37)
Albumin: 3.8 g/dL (ref 3.5–5.2)
Alkaline Phosphatase: 48 U/L (ref 39–117)
BUN: 11 mg/dL (ref 6–23)
CO2: 27 mEq/L (ref 19–32)
Calcium: 9.2 mg/dL (ref 8.4–10.5)
Chloride: 100 mEq/L (ref 96–112)
Creat: 0.62 mg/dL (ref 0.50–1.10)
GFR, Est African American: >89 mL/min
GFR, Est Non African American: >89 mL/min
Glucose, Bld: 170 mg/dL — ABNORMAL HIGH (ref 70–99)
Potassium: 4.3 mEq/L (ref 3.5–5.3)
Sodium: 138 mEq/L (ref 135–145)
Total Bilirubin: 0.3 mg/dL (ref 0.2–1.2)
Total Protein: 7.3 g/dL (ref 6.0–8.3)

## 2013-05-01 LAB — HEMOGLOBIN A1C
Hgb A1c MFr Bld: 9.1 % — ABNORMAL HIGH (ref <5.7)
Mean Plasma Glucose: 214 mg/dL — ABNORMAL HIGH (ref <117)

## 2013-05-02 ENCOUNTER — Encounter: Payer: Self-pay | Admitting: Family Medicine

## 2013-05-02 ENCOUNTER — Ambulatory Visit (INDEPENDENT_AMBULATORY_CARE_PROVIDER_SITE_OTHER): Payer: Medicare Other | Admitting: Family Medicine

## 2013-05-02 VITALS — BP 130/62 | HR 76 | Resp 18 | Ht 65.0 in | Wt 189.1 lb

## 2013-05-02 DIAGNOSIS — F341 Dysthymic disorder: Secondary | ICD-10-CM

## 2013-05-02 DIAGNOSIS — F419 Anxiety disorder, unspecified: Principal | ICD-10-CM

## 2013-05-02 DIAGNOSIS — F329 Major depressive disorder, single episode, unspecified: Secondary | ICD-10-CM

## 2013-05-02 DIAGNOSIS — I2510 Atherosclerotic heart disease of native coronary artery without angina pectoris: Secondary | ICD-10-CM | POA: Diagnosis not present

## 2013-05-02 DIAGNOSIS — E785 Hyperlipidemia, unspecified: Secondary | ICD-10-CM | POA: Diagnosis not present

## 2013-05-02 DIAGNOSIS — IMO0002 Reserved for concepts with insufficient information to code with codable children: Secondary | ICD-10-CM

## 2013-05-02 DIAGNOSIS — I10 Essential (primary) hypertension: Secondary | ICD-10-CM

## 2013-05-02 DIAGNOSIS — E1065 Type 1 diabetes mellitus with hyperglycemia: Secondary | ICD-10-CM

## 2013-05-02 DIAGNOSIS — E669 Obesity, unspecified: Secondary | ICD-10-CM | POA: Diagnosis not present

## 2013-05-02 DIAGNOSIS — IMO0001 Reserved for inherently not codable concepts without codable children: Secondary | ICD-10-CM

## 2013-05-02 DIAGNOSIS — Z794 Long term (current) use of insulin: Secondary | ICD-10-CM

## 2013-05-02 DIAGNOSIS — F32A Depression, unspecified: Secondary | ICD-10-CM

## 2013-05-02 DIAGNOSIS — E1165 Type 2 diabetes mellitus with hyperglycemia: Secondary | ICD-10-CM

## 2013-05-02 LAB — VITAMIN D 25 HYDROXY (VIT D DEFICIENCY, FRACTURES): Vit D, 25-Hydroxy: 34 ng/mL (ref 30–89)

## 2013-05-02 MED ORDER — PAROXETINE HCL 20 MG PO TABS: 20.0000 mg | ORAL_TABLET | Freq: Every day | ORAL | Status: DC

## 2013-05-02 NOTE — Patient Instructions (Signed)
F/u in 5 weeks  Bring book with formulary tomorrow please.  Medication will be adjusted  Paxil is increased and I will refer you back to therapy   Commit to 30 mins daily  You are referred to dietian for eating advice  Goal for fasting blood sugar ranges from 90 to 130 and 2 hours after any meal or at bedtime should be between 140 to 1180.  Test  3 times daily and record please   Blood pressure, cholesterol, liver and kidneys are fine

## 2013-05-03 ENCOUNTER — Telehealth (HOSPITAL_COMMUNITY): Payer: Self-pay | Admitting: *Deleted

## 2013-05-03 LAB — MICROALBUMIN / CREATININE URINE RATIO
Creatinine, Urine: 227.1 mg/dL
Microalb Creat Ratio: 10.4 mg/g (ref 0.0–30.0)
Microalb, Ur: 2.37 mg/dL — ABNORMAL HIGH (ref 0.00–1.89)

## 2013-05-04 ENCOUNTER — Telehealth (HOSPITAL_COMMUNITY): Payer: Self-pay | Admitting: Dietician

## 2013-05-04 NOTE — Telephone Encounter (Signed)
Received referral via fax from Dr. Moshe Cipro for dx: DM, HTN. Sent letter to pt home via [place] [employer] in attempt to contact pt to schedule appointment.

## 2013-05-07 ENCOUNTER — Other Ambulatory Visit: Payer: Self-pay

## 2013-05-07 MED ORDER — ACCU-CHEK MULTICLIX LANCETS MISC: Status: AC

## 2013-05-07 MED ORDER — GLUCOSE BLOOD VI STRP: ORAL_STRIP | Status: DC

## 2013-05-11 ENCOUNTER — Telehealth (HOSPITAL_COMMUNITY): Payer: Self-pay | Admitting: *Deleted

## 2013-05-16 ENCOUNTER — Telehealth: Payer: Self-pay | Admitting: Cardiovascular Disease

## 2013-05-16 NOTE — Telephone Encounter (Signed)
Received messages left by pt on 05/15/13 at 1143 and today at 1038. Called back at 1313. Appointment scheduled for 06/07/13 at 1000.

## 2013-05-16 NOTE — Telephone Encounter (Signed)
Pharmacy out of verapamil 180mg  capsules.. Needed OK to switch to tablets.. Which is what was on file in EPIC. refill authorized

## 2013-05-19 ENCOUNTER — Other Ambulatory Visit: Payer: Self-pay | Admitting: Family Medicine

## 2013-05-21 DIAGNOSIS — Z1231 Encounter for screening mammogram for malignant neoplasm of breast: Secondary | ICD-10-CM | POA: Diagnosis not present

## 2013-05-21 DIAGNOSIS — N951 Menopausal and female climacteric states: Secondary | ICD-10-CM | POA: Diagnosis not present

## 2013-06-06 ENCOUNTER — Ambulatory Visit: Payer: Medicare Other | Admitting: Family Medicine

## 2013-06-07 ENCOUNTER — Encounter: Payer: Self-pay | Admitting: Dietician

## 2013-06-07 NOTE — Progress Notes (Signed)
Outpatient Initial Nutrition Assessment  Date:06/07/2013   Appt Start Time: 1005  Referring Physician: Dr. Moshe Cipro Reason for Visit: diabetes  Nutrition Assessment:  Height: 5\' 5"  (165.1 cm)   Weight: 186 lb (84.369 kg)   IBW: 125#  %IBW: 149% UBW: 186#  %UBW: 100% Body mass index is 30.95 kg/(m^2). Meets criteria for obesity class I. Goal Weight: 167# (10% loss of current wt) Weight hx: Wt Readings from Last 10 Encounters:  05/02/13 189 lb 1.9 oz (85.784 kg)  01/05/13 197 lb (89.359 kg)  11/28/12 207 lb 1.9 oz (93.949 kg)  09/07/12 205 lb 6.4 oz (93.169 kg)  09/06/12 202 lb 11.2 oz (91.944 kg)  08/31/12 205 lb 1.9 oz (93.042 kg)  07/11/12 205 lb (92.987 kg)  04/25/12 198 lb 1.9 oz (89.867 kg)  04/06/12 199 lb 6.4 oz (90.447 kg)  02/11/12 200 lb (90.719 kg)    Estimated nutritional needs:  Kcals/ day: 1800-2000 Protein (grams)/day: 65-82  Fluid (L)/ day: 1.8-2.0  PMH:  Past Medical History  Diagnosis Date  . DJD (degenerative joint disease) of lumbar spine   . CAD (coronary artery disease)   . Diabetes mellitus type II     without complication  . Hypertension     benign   . Anxiety   . Thyroid cancer 2001  . Hyperlipidemia     Medications:  Current Outpatient Rx  Name  Route  Sig  Dispense  Refill  . Difluprednate (DUREZOL) 0.05 % EMUL   Ophthalmic   Apply 1 drop to eye. 1 drop in right eye three times a day         . glipiZIDE (GLUCOTROL) 10 MG tablet      TAKE 1 TABLET BY MOUTH TWICE DAILY   60 tablet   3   . glucose blood (ACCU-CHEK AVIVA PLUS) test strip      Use as instructed three times daily dx 250.01   100 each   5   . imipramine (TOFRANIL) 25 MG tablet      TAKE 4 TABLETS BY MOUTH EVERY NIGHT AT BEDTIME   120 tablet   2   . Insulin Glargine (LANTUS SOLOSTAR) 100 UNIT/ML SOPN      Dose increase effective 09/07/2012  50 units once daily   5 pen   11   . ketorolac (ACULAR) 0.5 % ophthalmic solution   Right Eye   Place 1 drop into  the right eye 2 (two) times daily.         . Lancets (ACCU-CHEK MULTICLIX) lancets      Use as instructed three times daily dx 250.01   100 each   5   . levothyroxine (SYNTHROID) 300 MCG tablet   Oral   Take 1 tablet (300 mcg total) by mouth daily before breakfast.   30 tablet   3   . loratadine (CLARITIN) 10 MG tablet   Oral   Take 10 mg by mouth daily as needed for allergies.         . metFORMIN (GLUCOPHAGE) 1000 MG tablet      TAKE 1 TABLET BY MOUTH TWICE DAILY   60 tablet   3   . metoprolol (LOPRESSOR) 50 MG tablet      TAKE 1 TABLET BY MOUTH TWICE DAILY   60 tablet   6   . nitroGLYCERIN (NITROSTAT) 0.4 MG SL tablet   Sublingual   Place 1 tablet (0.4 mg total) under the tongue every 5 (five) minutes as needed for  chest pain.   25 tablet   6   . PARoxetine (PAXIL) 20 MG tablet   Oral   Take 1 tablet (20 mg total) by mouth daily.   30 tablet   5     Dose increase effective 05/02/2013   . pravastatin (PRAVACHOL) 80 MG tablet   Oral   Take 1 tablet (80 mg total) by mouth daily.   30 tablet   2     Dispense as written.   . quinapril (ACCUPRIL) 10 MG tablet      TAKE 1 TABLET BY MOUTH EVERY DAY   30 tablet   2   . verapamil (CALAN-SR) 180 MG CR tablet   Oral   Take 180 mg by mouth at bedtime.           Labs: CMP     Component Value Date/Time   NA 138 05/01/2013 0924   K 4.3 05/01/2013 0924   CL 100 05/01/2013 0924   CO2 27 05/01/2013 0924   GLUCOSE 170* 05/01/2013 0924   BUN 11 05/01/2013 0924   CREATININE 0.62 05/01/2013 0924   CREATININE 0.75 07/11/2012 1139   CALCIUM 9.2 05/01/2013 0924   PROT 7.3 05/01/2013 0924   ALBUMIN 3.8 05/01/2013 0924   AST 18 05/01/2013 0924   ALT 19 05/01/2013 0924   ALKPHOS 48 05/01/2013 0924   BILITOT 0.3 05/01/2013 0924   GFRNONAA 86* 07/11/2012 1139   GFRAA >90 07/11/2012 1139    Lipid Panel     Component Value Date/Time   CHOL 165 05/01/2013 0924   TRIG 133 05/01/2013 0924   HDL 43 05/01/2013 0924   CHOLHDL  3.8 05/01/2013 0924   VLDL 27 05/01/2013 0924   LDLCALC 95 05/01/2013 0924     Lab Results  Component Value Date   HGBA1C 9.1* 05/01/2013   HGBA1C 8.0* 09/04/2012   HGBA1C 9.4* 04/03/2012   Lab Results  Component Value Date   MICROALBUR 2.37* 05/02/2013   LDLCALC 95 05/01/2013   CREATININE 0.62 05/01/2013     Lifestyle/ social habits: Ms. Pietras resides in [place] with her husband. Occupation: retired Physical activity: walking 15 minutes per day, [employer].   Nutrition hx/habits: Ms. Toms reports dietary noncompliance due to stress from her church. She is involved in many volunteer positions and reports that she is often stressed and leave limited time to eat and prepare meals. She expresses that she is trying to cut back on her activities, per the advice of Dr. Moshe Cipro.  Lunch is usually eaten at fast food restaurants. She admits not being careful about what she eats. Her beverages choices consist of mainly pink lemonade and sweet tea, although she is agreeable to using splenda to sweeten her drinks.  CBGs typically range from 110-120 AM fasting and 170-190 q HS.   Diet recall: Breakfast: toast with cheese, coffee with splenda; Lunch: chilli, baked potato OR salad, pink lemonade; Dinner: starch, meat vegetable; Beverages mainly consist of sweet tea, pink lemonade, coffee with splenda  Nutrition Diagnosis: Excessive carbohydrate intake r/t diet recall AEB Hgb A1c: 9.1.  Nutrition Intervention: Nutrition rx: 1400-1500 Kcal NAS, diabetic diet; 3 meals per day; no snacks; low calorie beverages only; Physical activity 30-60 minutes daily  Education/Counseling Provided: Educated pt on principles of diabetic diet. Discussed carbohydrate metabolism in relation to diabetes. Educated pt on basic self-management principles including: signs and symptoms of hyperglycemia and hypoglycemia, goals for fasting and postprandial blood sugars, goals for Hgb A1c, importance of checking  feet, importance of keeping PCP appointments, and foot care. Educated pt on plate method, portion sizes, and sources of carbohydrate. Discussed importance of regular meal pattern. Discussed importance of adding sources of whole grains to diet to improve glycemic control. Also encouraged to choose low fat dairy, lean meats, and whole fruits and vegetables more often. Discussed options of artificial sweeteners and encouraged pt to use which brand she liked best. Discussed nutritional content of foods commonly eaten and discussed healthier alternatives. Discussed importance of compliance to prevent further complications of disease. Educated pt on importance of physical activity (goal of at least 30 minutes 5 times per week) along with a healthy diet to achieve weight loss and glycemic goals. Encouraged slow, moderate weight loss of 1-2# per week, or 7-10% of current body weight. Provided "Diabetes and You" and "Carbohydrate Counting and Meal Planning" handouts. Used TeachBack to assess understanding.   Understanding, Motivation, Ability to Follow Recommendations: Expect fair to good compliance.   Monitoring and Evaluation: Goals: 1) 0.2-2# wt loss per week; 2) Physical activity 30-60 minutes per day  Recommendations: 1) Substitute Sprite for low calorie beverages; 2) Use splenda instead of sugar to sweeten beverages  F/U: PRN. Provided RD contact information.  Emanie Behan A. Jimmye Norman, RD, LDN 06/07/2013  Appt EndTime: 3329

## 2013-06-08 ENCOUNTER — Telehealth (HOSPITAL_COMMUNITY): Payer: Self-pay | Admitting: *Deleted

## 2013-06-10 NOTE — Assessment & Plan Note (Signed)
Deteriorated, not suicidal or homicidal , will benefit from therapy which she has done in the past , will refer back and increase dose of paxil also

## 2013-06-10 NOTE — Progress Notes (Signed)
Subjective:    Patient ID: Jennifer Blackburn, female    DOB: [birth year], 68 y.o.   MRN: [id number]  HPI The PT is here for follow up and re-evaluation of chronic medical conditions, medication management and review of any available recent lab and radiology data.  Preventive health is updated, specifically  Cancer screening and Immunization.   Recently seen by endo, no change in management The PT denies any adverse reactions to current medications since the last visit.  C/o increased and uncontrolled stress, anxiety and depression, nor suicidal or homicidal, requests returning to therapy and dsose increase in her medication. Tests sugar irregularly and often 180 and above which she knows is too high , but lacked the drive to do anything about this to help herself, ready to tackle this now again in a serious manner      Review of Systems See HPI Denies recent fever or chills.c/o fatigue  Denies sinus pressure, nasal congestion, ear pain or sore throat. Denies chest congestion, productive cough or wheezing. Denies chest pains, palpitations and leg swelling Denies abdominal pain, nausea, vomiting,diarrhea or constipation.   Denies dysuria, frequency, hesitancy or incontinence. Denies joint pain, swelling and limitation in mobility. Denies headaches, seizures, numbness, or tingling. Denies skin break down or rash.         Objective:   Physical Exam  BP 130/62  Pulse 76  Resp 18  Ht 5\' 5"  (1.651 m)  Wt 189 lb 1.9 oz (85.784 kg)  BMI 31.47 kg/m2  SpO2 97% Patient alert and oriented and in no cardiopulmonary distress.  HEENT: No facial asymmetry, EOMI, no sinus tenderness,  oropharynx pink and moist.  Neck supple no adenopathy.  Chest: Clear to auscultation bilaterally.  CVS: S1, S2 no murmurs, no S3.  ABD: Soft non tender. Bowel sounds normal.  Ext: No edema  MS: Adequate ROM spine, shoulders, hips and knees.  Skin: Intact, no ulcerations or rash noted.  Psych:  Good eye contact, at times tearful affect. Memory intact  depressed appearing.  CNS: CN 2-12 intact, power, tone and sensation normal throughout.       Assessment & Plan:  Diabetes mellitus, insulin dependent (IDDM), uncontrolled Deteriorated and uncontrolled , will need to  increase dose of lantus, pt states cost of insulin is high, she is to bring her formulary in, and to be more diligent with diet and exercise , also referred back for individual dietary counseling Patient advised to reduce carb and sweets, commit to regular physical activity, take meds as prescribed, test blood as directed, and attempt to lose weight, to improve blood sugar control. Encouraged to consider handing over diabetic care to endocrinologist , not ointerested currently , thoiugh already established with one for parathyroid disease  HYPERLIPIDEMIA Controlled, no change in medication Hyperlipidemia:Low fat diet discussed and encouraged.    OBESITY, UNSPECIFIED Deteriorated. Patient re-educated about  the importance of commitment to a  minimum of 150 minutes of exercise per week. The importance of healthy food choices with portion control discussed. Encouraged to start a food diary, count calories and to consider  joining a support group. Sample diet sheets offered. Goals set by the patient for the next several months.     Anxiety and depression Deteriorated, not suicidal or homicidal , will benefit from therapy which she has done in the past , will refer back and increase dose of paxil also  ESSENTIAL HYPERTENSION, BENIGN Controlled, no change in medication DASH diet and commitment to daily physical activity for  a minimum of 30 minutes discussed and encouraged, as a part of hypertension management. The importance of attaining a healthy weight is also discussed.   CAD deenis any recent chest discomfort  Or exertional fatigiue

## 2013-06-10 NOTE — Assessment & Plan Note (Signed)
Controlled, no change in medication Hyperlipidemia:Low fat diet discussed and encouraged.  \ 

## 2013-06-10 NOTE — Assessment & Plan Note (Signed)
Deteriorated. Patient re-educated about  the importance of commitment to a  minimum of 150 minutes of exercise per week. The importance of healthy food choices with portion control discussed. Encouraged to start a food diary, count calories and to consider  joining a support group. Sample diet sheets offered. Goals set by the patient for the next several months.    

## 2013-06-10 NOTE — Assessment & Plan Note (Signed)
Controlled, no change in medication DASH diet and commitment to daily physical activity for a minimum of 30 minutes discussed and encouraged, as a part of hypertension management. The importance of attaining a healthy weight is also discussed.  

## 2013-06-10 NOTE — Assessment & Plan Note (Signed)
Deteriorated and uncontrolled , will need to  increase dose of lantus, pt states cost of insulin is high, she is to bring her formulary in, and to be more diligent with diet and exercise , also referred back for individual dietary counseling Patient advised to reduce carb and sweets, commit to regular physical activity, take meds as prescribed, test blood as directed, and attempt to lose weight, to improve blood sugar control. Encouraged to consider handing over diabetic care to endocrinologist , not ointerested currently , thoiugh already established with one for parathyroid disease

## 2013-06-10 NOTE — Assessment & Plan Note (Signed)
deenis any recent chest discomfort  Or exertional fatigiue

## 2013-06-21 ENCOUNTER — Ambulatory Visit (HOSPITAL_COMMUNITY): Payer: Self-pay | Admitting: Psychiatry

## 2013-06-26 ENCOUNTER — Telehealth: Payer: Self-pay | Admitting: Family Medicine

## 2013-06-27 NOTE — Telephone Encounter (Signed)
Patient states that she has had a dull aching pain in both legs x 1 wk.  She is unsure if it is arthritis or muscle pain.  States that it started after she was doing some "work" last week.  Please advise.  States that Ibuprofen does help.

## 2013-06-27 NOTE — Telephone Encounter (Signed)
I recommend she take tylenol 325 mg one 3 times daily for 1 week, and use topical muscle rub like myoflex/bengay or similar to lower back 3 times daily. Good back posture so muscles are not strained is needed. If persists or worsens will need evaluation , please let her know (urgent care or orthopedics if worse in the next week despite above instructions)

## 2013-06-28 NOTE — Telephone Encounter (Signed)
Patient aware.

## 2013-07-02 ENCOUNTER — Ambulatory Visit (HOSPITAL_COMMUNITY): Payer: Self-pay | Admitting: Psychiatry

## 2013-07-04 ENCOUNTER — Other Ambulatory Visit: Payer: Self-pay | Admitting: Family Medicine

## 2013-07-17 ENCOUNTER — Ambulatory Visit: Payer: Medicare Other | Admitting: Family Medicine

## 2013-07-19 ENCOUNTER — Ambulatory Visit (INDEPENDENT_AMBULATORY_CARE_PROVIDER_SITE_OTHER): Payer: Medicare Other | Admitting: Psychiatry

## 2013-07-19 DIAGNOSIS — F419 Anxiety disorder, unspecified: Secondary | ICD-10-CM

## 2013-07-19 DIAGNOSIS — F411 Generalized anxiety disorder: Secondary | ICD-10-CM | POA: Diagnosis not present
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
## 2013-07-20 NOTE — Patient Instructions (Signed)
Discussed orally 

## 2013-07-23 ENCOUNTER — Telehealth: Payer: Self-pay | Admitting: Cardiovascular Disease

## 2013-07-24 NOTE — Telephone Encounter (Signed)
Closed encounter °

## 2013-07-30 ENCOUNTER — Encounter: Payer: Self-pay | Admitting: Family Medicine

## 2013-07-30 ENCOUNTER — Ambulatory Visit (INDEPENDENT_AMBULATORY_CARE_PROVIDER_SITE_OTHER): Payer: Medicare Other | Admitting: Family Medicine

## 2013-07-30 ENCOUNTER — Other Ambulatory Visit: Payer: Self-pay | Admitting: Family Medicine

## 2013-07-30 VITALS — BP 120/70 | HR 64 | Resp 18 | Ht 65.0 in | Wt 189.0 lb

## 2013-07-30 DIAGNOSIS — Z794 Long term (current) use of insulin: Principal | ICD-10-CM

## 2013-07-30 DIAGNOSIS — E669 Obesity, unspecified: Secondary | ICD-10-CM | POA: Diagnosis not present

## 2013-07-30 DIAGNOSIS — E1165 Type 2 diabetes mellitus with hyperglycemia: Principal | ICD-10-CM

## 2013-07-30 DIAGNOSIS — R5381 Other malaise: Secondary | ICD-10-CM | POA: Diagnosis not present

## 2013-07-30 DIAGNOSIS — IMO0002 Reserved for concepts with insufficient information to code with codable children: Secondary | ICD-10-CM

## 2013-07-30 DIAGNOSIS — F329 Major depressive disorder, single episode, unspecified: Secondary | ICD-10-CM

## 2013-07-30 DIAGNOSIS — R5383 Other fatigue: Secondary | ICD-10-CM

## 2013-07-30 DIAGNOSIS — I2510 Atherosclerotic heart disease of native coronary artery without angina pectoris: Secondary | ICD-10-CM

## 2013-07-30 DIAGNOSIS — F341 Dysthymic disorder: Secondary | ICD-10-CM

## 2013-07-30 DIAGNOSIS — E1065 Type 1 diabetes mellitus with hyperglycemia: Secondary | ICD-10-CM | POA: Diagnosis not present

## 2013-07-30 DIAGNOSIS — D539 Nutritional anemia, unspecified: Secondary | ICD-10-CM | POA: Diagnosis not present

## 2013-07-30 DIAGNOSIS — I10 Essential (primary) hypertension: Secondary | ICD-10-CM

## 2013-07-30 DIAGNOSIS — J3489 Other specified disorders of nose and nasal sinuses: Secondary | ICD-10-CM | POA: Insufficient documentation

## 2013-07-30 DIAGNOSIS — F32A Depression, unspecified: Secondary | ICD-10-CM

## 2013-07-30 DIAGNOSIS — E785 Hyperlipidemia, unspecified: Secondary | ICD-10-CM

## 2013-07-30 DIAGNOSIS — IMO0001 Reserved for inherently not codable concepts without codable children: Secondary | ICD-10-CM

## 2013-07-30 DIAGNOSIS — F419 Anxiety disorder, unspecified: Secondary | ICD-10-CM

## 2013-07-30 LAB — COMPLETE METABOLIC PANEL WITH GFR
ALT: 13 U/L (ref 0–35)
AST: 12 U/L (ref 0–37)
Albumin: 4 g/dL (ref 3.5–5.2)
Alkaline Phosphatase: 54 U/L (ref 39–117)
BUN: 11 mg/dL (ref 6–23)
CO2: 28 mEq/L (ref 19–32)
Calcium: 9 mg/dL (ref 8.4–10.5)
Chloride: 101 mEq/L (ref 96–112)
Creat: 0.6 mg/dL (ref 0.50–1.10)
GFR, Est African American: >89 mL/min
GFR, Est Non African American: >89 mL/min
Glucose, Bld: 140 mg/dL — ABNORMAL HIGH (ref 70–99)
Potassium: 4 mEq/L (ref 3.5–5.3)
Sodium: 140 mEq/L (ref 135–145)
Total Bilirubin: 0.3 mg/dL (ref 0.2–1.2)
Total Protein: 7 g/dL (ref 6.0–8.3)

## 2013-07-30 LAB — CBC
HCT: 33.3 % — ABNORMAL LOW (ref 36.0–46.0)
Hemoglobin: 10.8 g/dL — ABNORMAL LOW (ref 12.0–15.0)
MCH: 25.5 pg — ABNORMAL LOW (ref 26.0–34.0)
MCHC: 32.4 g/dL (ref 30.0–36.0)
MCV: 78.5 fL (ref 78.0–100.0)
Platelets: 274 10*3/uL (ref 150–400)
RBC: 4.24 MIL/uL (ref 3.87–5.11)
RDW: 14.7 % (ref 11.5–15.5)
WBC: 7.3 10*3/uL (ref 4.0–10.5)

## 2013-07-30 LAB — HEMOGLOBIN A1C
Hgb A1c MFr Bld: 8 % — ABNORMAL HIGH (ref <5.7)
Mean Plasma Glucose: 183 mg/dL — ABNORMAL HIGH (ref <117)

## 2013-07-30 NOTE — Patient Instructions (Addendum)
F/u in 3.5 month, call if you need me before  You are referred for eye exam and to ENT Constance Holster) and for dexa  HBa1C, chem 7 and EGFR today  Please start daily bike riding or walking in addition to sit ups   Blood sugar goal is fasting 110 to 135, and  bedtime 150 to 190  I believe that you need lantus 45 units daily  Call if you need referral to podiatry   Fasting lipid, cmp and EGFr HBa1C, cBC in 3.5 month, before next visit

## 2013-08-01 LAB — IRON: Iron: 45 ug/dL (ref 42–145)

## 2013-08-01 LAB — FERRITIN: Ferritin: 140 ng/mL (ref 10–291)

## 2013-08-06 ENCOUNTER — Other Ambulatory Visit: Payer: Self-pay | Admitting: Family Medicine

## 2013-08-09 DIAGNOSIS — E119 Type 2 diabetes mellitus without complications: Secondary | ICD-10-CM | POA: Diagnosis not present

## 2013-08-09 DIAGNOSIS — C73 Malignant neoplasm of thyroid gland: Secondary | ICD-10-CM | POA: Diagnosis not present

## 2013-08-09 DIAGNOSIS — E890 Postprocedural hypothyroidism: Secondary | ICD-10-CM | POA: Diagnosis not present

## 2013-08-16 ENCOUNTER — Ambulatory Visit (HOSPITAL_COMMUNITY): Payer: Self-pay | Admitting: Psychiatry

## 2013-08-16 DIAGNOSIS — I10 Essential (primary) hypertension: Secondary | ICD-10-CM | POA: Diagnosis not present

## 2013-08-16 DIAGNOSIS — Z6831 Body mass index (BMI) 31.0-31.9, adult: Secondary | ICD-10-CM | POA: Diagnosis not present

## 2013-08-16 DIAGNOSIS — C73 Malignant neoplasm of thyroid gland: Secondary | ICD-10-CM | POA: Diagnosis not present

## 2013-08-16 DIAGNOSIS — E1159 Type 2 diabetes mellitus with other circulatory complications: Secondary | ICD-10-CM | POA: Diagnosis not present

## 2013-08-16 DIAGNOSIS — E785 Hyperlipidemia, unspecified: Secondary | ICD-10-CM | POA: Diagnosis not present

## 2013-08-16 DIAGNOSIS — E669 Obesity, unspecified: Secondary | ICD-10-CM | POA: Diagnosis not present

## 2013-08-16 DIAGNOSIS — E039 Hypothyroidism, unspecified: Secondary | ICD-10-CM | POA: Diagnosis not present

## 2013-08-17 ENCOUNTER — Ambulatory Visit (INDEPENDENT_AMBULATORY_CARE_PROVIDER_SITE_OTHER): Payer: Medicare Other | Admitting: Psychiatry

## 2013-08-17 DIAGNOSIS — F419 Anxiety disorder, unspecified: Secondary | ICD-10-CM

## 2013-08-17 DIAGNOSIS — F411 Generalized anxiety disorder: Secondary | ICD-10-CM
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
## 2013-08-21 DIAGNOSIS — R51 Headache: Secondary | ICD-10-CM | POA: Diagnosis not present

## 2013-08-30 ENCOUNTER — Other Ambulatory Visit: Payer: Self-pay | Admitting: Family Medicine

## 2013-08-30 ENCOUNTER — Ambulatory Visit (HOSPITAL_COMMUNITY): Payer: Self-pay | Admitting: Psychiatry

## 2013-09-05 DIAGNOSIS — H524 Presbyopia: Secondary | ICD-10-CM | POA: Diagnosis not present

## 2013-09-05 DIAGNOSIS — H35349 Macular cyst, hole, or pseudohole, unspecified eye: Secondary | ICD-10-CM | POA: Diagnosis not present

## 2013-09-05 DIAGNOSIS — H5231 Anisometropia: Secondary | ICD-10-CM | POA: Diagnosis not present

## 2013-09-05 DIAGNOSIS — H52229 Regular astigmatism, unspecified eye: Secondary | ICD-10-CM | POA: Diagnosis not present

## 2013-09-05 LAB — HM DIABETES EYE EXAM

## 2013-09-14 ENCOUNTER — Encounter: Payer: Self-pay | Admitting: Cardiovascular Disease

## 2013-09-14 ENCOUNTER — Ambulatory Visit (INDEPENDENT_AMBULATORY_CARE_PROVIDER_SITE_OTHER): Payer: Medicare Other | Admitting: Cardiovascular Disease

## 2013-09-14 VITALS — BP 142/62 | HR 72 | Ht 65.0 in | Wt 188.3 lb

## 2013-09-14 DIAGNOSIS — I10 Essential (primary) hypertension: Secondary | ICD-10-CM | POA: Diagnosis not present

## 2013-09-14 DIAGNOSIS — I2510 Atherosclerotic heart disease of native coronary artery without angina pectoris: Secondary | ICD-10-CM | POA: Diagnosis not present

## 2013-09-14 DIAGNOSIS — E785 Hyperlipidemia, unspecified: Secondary | ICD-10-CM | POA: Diagnosis not present

## 2013-09-14 NOTE — Progress Notes (Signed)
09/14/2013 Jennifer Blackburn   Jul 14, 1945  [id number]  Primary Physician Tula Nakayama, MD Primary Cardiologist: Lorretta Harp MD Renae Gloss   HPI:  The patient is a 68 year old, moderately overweight, married [place] American female, mother of 2, grandmother to 4 grandchildren who I last saw in the office 6 months ago. She has a history of minimal CAD by cath back in June 2005 with normal LV function. Her other problems include treated hypertension, diabetes and dyslipidemia. She denied any chest pain or shortness of breath. She does have a strong family history of heart disease with a father that died of an MI at age 90. Most recent lipid profile performed by Dr. Moshe Cipro revealed a total cholesterol of 235, LDL of 161, HDL 45. Based on this, Dr. Moshe Cipro restarted her on a statin drug (pravastatin 40.) and Dr. Moshe Cipro continues to follow her with profile which was just checked a 05/01/48 related to postural with 65 and LDL 95 HDL 43. She denies chest pain or shortness of breath.   Current Outpatient Prescriptions  Medication Sig Dispense Refill  . glipiZIDE (GLUCOTROL) 10 MG tablet TAKE 1 TABLET BY MOUTH TWICE DAILY  60 tablet  3  . glucose blood (ACCU-CHEK AVIVA PLUS) test strip Use as instructed three times daily dx 250.01  100 each  5  . imipramine (TOFRANIL) 25 MG tablet TAKE 4 TABLETS BY MOUTH EVERY NIGHT AT BEDTIME  120 tablet  2  . Insulin Glargine (LANTUS) 100 UNIT/ML Solostar Pen Inject 40 Units into the skin daily at 10 pm.      . Lancets (ACCU-CHEK MULTICLIX) lancets Use as instructed three times daily dx 250.01  100 each  5  . levothyroxine (SYNTHROID) 300 MCG tablet Take 1 tablet (300 mcg total) by mouth daily before breakfast.  30 tablet  3  . loratadine (CLARITIN) 10 MG tablet Take 10 mg by mouth daily as needed for allergies.      . metFORMIN (GLUCOPHAGE) 1000 MG tablet TAKE 1 TABLET BY MOUTH TWICE DAILY  60 tablet  3  . metoprolol (LOPRESSOR) 50 MG tablet  TAKE 1 TABLET BY MOUTH TWICE DAILY  60 tablet  6  . nitroGLYCERIN (NITROSTAT) 0.4 MG SL tablet Place 1 tablet (0.4 mg total) under the tongue every 5 (five) minutes as needed for chest pain.  25 tablet  6  . PARoxetine (PAXIL) 20 MG tablet Take 1 tablet (20 mg total) by mouth daily.  30 tablet  5  . pravastatin (PRAVACHOL) 80 MG tablet Take 1 tablet (80 mg total) by mouth daily.  30 tablet  2  . quinapril (ACCUPRIL) 10 MG tablet TAKE 1 TABLET BY MOUTH EVERY DAY  30 tablet  2  . verapamil (CALAN-SR) 180 MG CR tablet Take 180 mg by mouth at bedtime.       No current facility-administered medications for this visit.    Allergies  Allergen Reactions  . Nsaids Hives  . Sulfonamide Derivatives Hives    History   Social History  . Marital Status: Married    Spouse Name: N/A    Number of Children: N/A  . Years of Education: N/A   Occupational History  . retired     Social History Main Topics  . Smoking status: Passive Smoke Exposure - Never Smoker  . Smokeless tobacco: Never Used  . Alcohol Use: No  . Drug Use: No  . Sexual Activity: Not on file   Other Topics Concern  . Not  on file   Social History Narrative  . No narrative on file     Review of Systems: General: negative for chills, fever, night sweats or weight changes.  Cardiovascular: negative for chest pain, dyspnea on exertion, edema, orthopnea, palpitations, paroxysmal nocturnal dyspnea or shortness of breath Dermatological: negative for rash Respiratory: negative for cough or wheezing Urologic: negative for hematuria Abdominal: negative for nausea, vomiting, diarrhea, bright red blood per rectum, melena, or hematemesis Neurologic: negative for visual changes, syncope, or dizziness All other systems reviewed and are otherwise negative except as noted above.    Blood pressure 142/62, pulse 72, height 5\' 5"  (1.651 m), weight 188 lb 4.8 oz (85.412 kg).  General appearance: alert and no distress Neck: no adenopathy,  no carotid bruit, no JVD, supple, symmetrical, trachea midline and thyroid not enlarged, symmetric, no tenderness/mass/nodules Lungs: clear to auscultation bilaterally Heart: regular rate and rhythm, S1, S2 normal, no murmur, click, rub or gallop Extremities: extremities normal, atraumatic, no cyanosis or edema  EKG normal sinus rhythm 72 with ST or T wave changes  ASSESSMENT AND PLAN:   ESSENTIAL HYPERTENSION, BENIGN Controlled on current medications  HYPERLIPIDEMIA On statin therapy with her most recent lipid profile performed 05/01/13 related to close to 165 and LDL of 95 and HDL of [place] MD [facility], [facility] 09/14/2013 2:06 PM

## 2013-09-14 NOTE — Patient Instructions (Signed)
Your physician recommends that you schedule a follow-up appointment in: 1 year  

## 2013-09-14 NOTE — Assessment & Plan Note (Signed)
Controlled on current medications 

## 2013-09-14 NOTE — Assessment & Plan Note (Signed)
On statin therapy with her most recent lipid profile performed 05/01/13 related to close to 165 and LDL of 95 and HDL of 43

## 2013-09-17 ENCOUNTER — Ambulatory Visit (INDEPENDENT_AMBULATORY_CARE_PROVIDER_SITE_OTHER): Payer: Medicare Other | Admitting: Psychiatry

## 2013-09-17 DIAGNOSIS — F329 Major depressive disorder, single episode, unspecified: Secondary | ICD-10-CM | POA: Diagnosis not present

## 2013-09-17 DIAGNOSIS — F411 Generalized anxiety disorder: Secondary | ICD-10-CM | POA: Diagnosis not present

## 2013-09-17 DIAGNOSIS — F3289 Other specified depressive episodes: Secondary | ICD-10-CM

## 2013-09-18 NOTE — Patient Instructions (Signed)
Discussed orally 
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
## 2013-10-08 ENCOUNTER — Ambulatory Visit (HOSPITAL_COMMUNITY): Payer: Self-pay | Admitting: Psychiatry

## 2013-10-19 ENCOUNTER — Ambulatory Visit (INDEPENDENT_AMBULATORY_CARE_PROVIDER_SITE_OTHER): Payer: Medicare Other | Admitting: Psychiatry

## 2013-10-19 DIAGNOSIS — F411 Generalized anxiety disorder: Secondary | ICD-10-CM | POA: Diagnosis not present

## 2013-10-19 DIAGNOSIS — F329 Major depressive disorder, single episode, unspecified: Secondary | ICD-10-CM

## 2013-10-19 DIAGNOSIS — F3289 Other specified depressive episodes: Secondary | ICD-10-CM | POA: Diagnosis not present

## 2013-10-19 NOTE — Patient Instructions (Signed)
Discussed orally 
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
## 2013-10-29 NOTE — Assessment & Plan Note (Addendum)
Controlled, no change in medication Hyperlipidemia:Low fat diet discussed and encouraged.  \ 

## 2013-10-29 NOTE — Assessment & Plan Note (Signed)
Controlled, no change in medication  

## 2013-10-29 NOTE — Progress Notes (Signed)
   Subjective:    Patient ID: Jennifer Blackburn, female    DOB: 06/29/1945, 68 y.o.   MRN: [id number]  HPI The PT is here for follow up and re-evaluation of chronic medical conditions, medication management and review of any available recent lab and radiology data.  Preventive health is updated, specifically  Cancer screening and Immunization.   Questions or concerns regarding consultations or procedures which the PT has had in the interim are  Addressed.Happy with therapy, needs to return for more help The PT denies any adverse reactions to current medications since the last visit.  Chronic facial pain over sinuses, wants ENT to reveal, no sinus draibnage , feevr or chills, no sore throat or ear pain Improvement in blood sugar, no low sugars and generally blood sugar is under 150 fasting Has increased exercise also     Review of Systems See HPI Denies recent fever or chills. Denies sinus pressure, nasal congestion, ear pain or sore throat. Denies chest congestion, productive cough or wheezing. Denies chest pains, palpitations and leg swelling Denies abdominal pain, nausea, vomiting,diarrhea or constipation.   Denies dysuria, frequency, hesitancy or incontinence. Chronic  joint pain, swelling and limitation in mobility. Denies headaches, seizures, numbness, or tingling. Denies uncontrolled  depression, anxiety or insomnia. Denies skin break down or rash.        Objective:   Physical Exam  BP 120/70  Pulse 64  Resp 18  Ht 5\' 5"  (1.651 m)  Wt 189 lb 0.6 oz (85.748 kg)  BMI 31.46 kg/m2  SpO2 99% Patient alert and oriented and in no cardiopulmonary distress.  HEENT: No facial asymmetry, EOMI,   oropharynx pink and moist.  Neck supple no JVD, no mass. Maxillary sinus tender on palpation TM clear bilaterally, oropharynx no erythema or exudate Chest: Clear to auscultation bilaterally.  CVS: S1, S2 no murmurs, no S3.Regular rate.  ABD: Soft non tender.   Ext: No  edema  MS: Adequate though reduced  ROM spine, shoulders, hips and knees.  Skin: Intact, no ulcerations or rash noted.  Psych: Good eye contact, normal affect. Memory intact not anxious or depressed appearing.  CNS: CN 2-12 intact, power,  normal throughout.no focal deficits noted.       Assessment & Plan:  Diabetes mellitus, insulin dependent (IDDM), uncontrolled Improved, pt applauded on this Patient advised to reduce carb and sweets, commit to regular physical activity, take meds as prescribed, test blood as directed, and attempt to lose weight, to improve blood sugar control.   Hyperlipidemia LDL goal <100 Controlled, no change in medication Hyperlipidemia:Low fat diet discussed and encouraged. \   Anxiety and depression Improved, dose increase in paxil and pt to continue therapy  OBESITY, UNSPECIFIED Unchanged Patient re-educated about  the importance of commitment to a  minimum of 150 minutes of exercise per week. The importance of healthy food choices with portion control discussed. Encouraged to start a food diary, count calories and to consider  joining a support group. Sample diet sheets offered. Goals set by the patient for the next several months.     Sinus pain Chronic, refer to ENT per pt request, history and exam are not indicative of significant pathology  GERD Controlled, no change in medication

## 2013-10-29 NOTE — Assessment & Plan Note (Signed)
Unchanged. Patient re-educated about  the importance of commitment to a  minimum of 150 minutes of exercise per week. The importance of healthy food choices with portion control discussed. Encouraged to start a food diary, count calories and to consider  joining a support group. Sample diet sheets offered. Goals set by the patient for the next several months.    

## 2013-10-29 NOTE — Assessment & Plan Note (Signed)
Improved, dose increase in paxil and pt to continue therapy

## 2013-10-29 NOTE — Assessment & Plan Note (Signed)
Improved, pt applauded on this Patient advised to reduce carb and sweets, commit to regular physical activity, take meds as prescribed, test blood as directed, and attempt to lose weight, to improve blood sugar control.  

## 2013-10-29 NOTE — Assessment & Plan Note (Signed)
Chronic, refer to ENT per pt request, history and exam are not indicative of significant pathology

## 2013-10-30 ENCOUNTER — Encounter: Payer: Self-pay | Admitting: Family Medicine

## 2013-10-30 ENCOUNTER — Ambulatory Visit (INDEPENDENT_AMBULATORY_CARE_PROVIDER_SITE_OTHER): Payer: Medicare Other | Admitting: Family Medicine

## 2013-10-30 VITALS — BP 130/74 | HR 73 | Temp 98.6°F | Resp 16 | Ht 65.0 in | Wt 191.0 lb

## 2013-10-30 DIAGNOSIS — E785 Hyperlipidemia, unspecified: Secondary | ICD-10-CM

## 2013-10-30 DIAGNOSIS — I2510 Atherosclerotic heart disease of native coronary artery without angina pectoris: Secondary | ICD-10-CM

## 2013-10-30 DIAGNOSIS — IMO0002 Reserved for concepts with insufficient information to code with codable children: Secondary | ICD-10-CM

## 2013-10-30 DIAGNOSIS — I10 Essential (primary) hypertension: Secondary | ICD-10-CM

## 2013-10-30 DIAGNOSIS — J029 Acute pharyngitis, unspecified: Secondary | ICD-10-CM

## 2013-10-30 DIAGNOSIS — Z794 Long term (current) use of insulin: Secondary | ICD-10-CM

## 2013-10-30 DIAGNOSIS — E1165 Type 2 diabetes mellitus with hyperglycemia: Secondary | ICD-10-CM

## 2013-10-30 DIAGNOSIS — J309 Allergic rhinitis, unspecified: Secondary | ICD-10-CM

## 2013-10-30 DIAGNOSIS — IMO0001 Reserved for inherently not codable concepts without codable children: Secondary | ICD-10-CM

## 2013-10-30 DIAGNOSIS — E1065 Type 1 diabetes mellitus with hyperglycemia: Secondary | ICD-10-CM

## 2013-10-30 LAB — POCT RAPID STREP A (OFFICE): Rapid Strep A Screen: NEGATIVE

## 2013-10-30 NOTE — Patient Instructions (Addendum)
F/u as before   You have uncontrolled/untreated allergy symptoms  COMMIT to once daly loratidine (claritin) 10 mg every day, until symptoms go away. oK to take ONE sudafed once daily on the days you are extra drippy, this reduces the secretions, generally after 3 days you should not need this. Take the sudafed AS  WELL AS the loratidine, do not take sudafed instead of loratidine  Call/message/ page me if instead of feeling better , you develop fever, chills, yellow green bloody sputum or cough up green sputum  Fasting lipid, cmp /EGFr, HBA1C before next visit and you will get info on the diabetic class in Sept 

## 2013-11-02 ENCOUNTER — Other Ambulatory Visit: Payer: Self-pay

## 2013-11-02 ENCOUNTER — Telehealth: Payer: Self-pay | Admitting: *Deleted

## 2013-11-02 MED ORDER — BENZONATATE 100 MG PO CAPS: 100.0000 mg | ORAL_CAPSULE | Freq: Two times a day (BID) | ORAL | Status: DC | PRN

## 2013-11-02 MED ORDER — PREDNISONE 5 MG PO TABS: 5.0000 mg | ORAL_TABLET | Freq: Two times a day (BID) | ORAL | Status: DC

## 2013-11-02 NOTE — Telephone Encounter (Signed)
Pls  erx tessalon perles 100mg  twice daily #20 ( check if on back order then 200mg  dose twice daily) Explain this is a good decongestant Pred 5mg  one twice daily for 5 days will help with uncontrolled allergy and cough, pls erx if she agrees #10 With no fever etc, nothing else suggested  At this  Time  Advise a lot of water intake  Thanks!

## 2013-11-02 NOTE — Telephone Encounter (Signed)
Still doesn't feel good. Symptoms not worse- just not better. Coughing but still coughing up clear-white phlegm. Bought tussin DM today to try. Still taking Claritin and Ibuprofen for headache. Just wanted you to know incase you wanted to do anything different

## 2013-11-02 NOTE — Telephone Encounter (Signed)
Pt called and LMOM stating she is not feeling 100% she is not sure if she needs to keep taking medicine or stop it pt still feels achey and coughing, pt also still has headaches please advise

## 2013-11-02 NOTE — Telephone Encounter (Signed)
Pt aware med sent 

## 2013-11-06 ENCOUNTER — Ambulatory Visit (HOSPITAL_COMMUNITY)
Admission: RE | Admit: 2013-11-06 | Discharge: 2013-11-06 | Disposition: A | Discharge status: Home / Self Care | Payer: Medicare Other | Source: Ambulatory Visit | Attending: Family Medicine | Admitting: Family Medicine

## 2013-11-06 ENCOUNTER — Encounter: Payer: Self-pay | Admitting: Family Medicine

## 2013-11-06 ENCOUNTER — Ambulatory Visit (INDEPENDENT_AMBULATORY_CARE_PROVIDER_SITE_OTHER): Payer: Medicare Other | Admitting: Family Medicine

## 2013-11-06 VITALS — BP 130/70 | HR 77 | Resp 16 | Ht 65.0 in | Wt 189.1 lb

## 2013-11-06 DIAGNOSIS — J45909 Unspecified asthma, uncomplicated: Secondary | ICD-10-CM | POA: Diagnosis not present

## 2013-11-06 DIAGNOSIS — I10 Essential (primary) hypertension: Secondary | ICD-10-CM

## 2013-11-06 DIAGNOSIS — IMO0002 Reserved for concepts with insufficient information to code with codable children: Secondary | ICD-10-CM | POA: Diagnosis not present

## 2013-11-06 DIAGNOSIS — I2510 Atherosclerotic heart disease of native coronary artery without angina pectoris: Secondary | ICD-10-CM | POA: Diagnosis not present

## 2013-11-06 DIAGNOSIS — M25562 Pain in left knee: Secondary | ICD-10-CM

## 2013-11-06 DIAGNOSIS — J4520 Mild intermittent asthma, uncomplicated: Secondary | ICD-10-CM

## 2013-11-06 DIAGNOSIS — M25569 Pain in unspecified knee: Secondary | ICD-10-CM

## 2013-11-06 DIAGNOSIS — M1710 Unilateral primary osteoarthritis, unspecified knee: Secondary | ICD-10-CM | POA: Diagnosis not present

## 2013-11-06 MED ORDER — METHYLPREDNISOLONE ACETATE 80 MG/ML IJ SUSP
80.0000 mg | Freq: Once | INTRAMUSCULAR | Status: AC
  Administered 2013-11-06: 80 mg via INTRAMUSCULAR

## 2013-11-06 NOTE — Assessment & Plan Note (Addendum)
Depo medrol in office , then ibuprofen 400mg  twice daily for 5 days. Will refer to ortho if persists.  Has had right arthroscopy, x ray of left knee today

## 2013-11-06 NOTE — Assessment & Plan Note (Signed)
Improved with prednisone dose pack

## 2013-11-06 NOTE — Assessment & Plan Note (Signed)
Controlled, no change in medication  

## 2013-11-06 NOTE — Patient Instructions (Signed)
F/u as before  Glad cough is better with the prednisone, I believe this is allergic based cough  For acute left knee pain you willl get depo medrol in the office , and you need to get xray of the knee today  Take ibuprofen 200mg  tablets, TWO twice daily for the next 5 days.  Call back for ortho referral if you continue to have severe debilitating left knee pain

## 2013-11-06 NOTE — Progress Notes (Signed)
   Subjective:    Patient ID: Jennifer Blackburn, female    DOB: 1945/12/05, 68 y.o.   MRN: [id number]  HPI Pt feeling better as far as cough and chest congestion are concerned, prednisone helped. No recent fever or chills, sputum is yellow 2 day h/o acute left knee pain and instability due to pain, no recent trauma, has had right knee a    Review of Systems See HPI  Denies chest pains, palpitations and leg swelling Denies abdominal pain, nausea, vomiting,diarrhea or constipation.   Denies dysuria, frequency, hesitancy or incontinence.  Denies headaches, seizures, numbness, or tingling. Denies depression, anxiety or insomnia. Denies skin break down or rash.        Objective:   Physical Exam BP 130/70  Pulse 77  Resp 16  Ht 5\' 5"  (1.651 m)  Wt 189 lb 1.9 oz (85.784 kg)  BMI 31.47 kg/m2  SpO2 97% Patient alert and oriented and in no cardiopulmonary distress.  HEENT: No facial asymmetry, EOMI,   oropharynx pink and moist.  Neck supple no JVD, no mass.  Chest: Clear to auscultation bilaterally.  CVS: S1, S2 no murmurs, no S3.Regular rate.  ABD: Soft non tender.   Ext: No edema  MS: Adequate ROM spine, shoulders, hips and reduced in left knee, tender over medial and anterior aspects left knee   CNS: CN 2-12 intact, power,  normal throughout.no focal deficits noted.        Assessment & Plan:  Bronchitis, allergic Improved with prednisone dose pack  Left anterior knee pain Depo medrol in office , then ibuprofen 400mg  twice daily for 5 days. Will refer to ortho if persists.  Has had right arthroscopy, x ray of left knee today  ESSENTIAL HYPERTENSION, BENIGN Controlled, no change in medication

## 2013-11-28 ENCOUNTER — Ambulatory Visit: Payer: Medicare Other | Admitting: Family Medicine

## 2013-12-04 ENCOUNTER — Telehealth: Payer: Self-pay | Admitting: Family Medicine

## 2013-12-04 DIAGNOSIS — E785 Hyperlipidemia, unspecified: Secondary | ICD-10-CM

## 2013-12-04 DIAGNOSIS — R5381 Other malaise: Secondary | ICD-10-CM

## 2013-12-04 DIAGNOSIS — E1165 Type 2 diabetes mellitus with hyperglycemia: Secondary | ICD-10-CM

## 2013-12-04 DIAGNOSIS — Z1211 Encounter for screening for malignant neoplasm of colon: Secondary | ICD-10-CM

## 2013-12-04 DIAGNOSIS — J029 Acute pharyngitis, unspecified: Secondary | ICD-10-CM | POA: Insufficient documentation

## 2013-12-04 DIAGNOSIS — Z794 Long term (current) use of insulin: Secondary | ICD-10-CM

## 2013-12-04 DIAGNOSIS — R5383 Other fatigue: Secondary | ICD-10-CM

## 2013-12-04 DIAGNOSIS — IMO0001 Reserved for inherently not codable concepts without codable children: Secondary | ICD-10-CM

## 2013-12-04 NOTE — Assessment & Plan Note (Signed)
Uncontrolled, commitment to daily triple treatment discussed and advised

## 2013-12-04 NOTE — Addendum Note (Signed)
Addended by: Eual Fines on: 12/04/2013 02:08 PM   Modules accepted: Orders

## 2013-12-04 NOTE — Assessment & Plan Note (Signed)
Controlled, no change in medication  

## 2013-12-04 NOTE — Telephone Encounter (Signed)
Pt has no appt in system. HBa1C is currently past due pls call her , needs HBa1C, fasting lipid, cmp and EGFR, to be done end Sept ,needs October appt for annual wellness initial in October please sched and let her know , I will sign lab orders Also let he rknow colonoscopy, if she agrees pls refer her to Gi of her choice i will sign

## 2013-12-04 NOTE — Assessment & Plan Note (Addendum)
Rapid strep negative, allergy based and likely viral, symptomatic treatment  Only call back if worsens

## 2013-12-04 NOTE — Progress Notes (Signed)
   Subjective:    Patient ID: Jennifer Blackburn, female    DOB: 1945/09/17, 68 y.o.   MRN: [id number]  HPI 3 day h/o cough, increased post nasal drainage , cough and sore throat, has had  chills and generalized aches, no documented fever, does not feel well. Cough is non productive and worse at night with post nasal drainage. C/o ear fullness bilaterally Blood sugars have improved and feels  better about her blood sugars. Sees therapist regularly for anxiety , and depression, good progress with therapy   Review of Systems See HPI  Denies chest pains, palpitations and leg swelling Denies abdominal pain, nausea, vomiting,diarrhea or constipation.   Denies dysuria, frequency, hesitancy or incontinence. Denies joint pain, swelling and limitation in mobility. Denies , seizures, numbness, or tingling. Denies uncontrolled  depression, anxiety problems with  Insomnia due to cough Denies skin break down or rash.        Objective:   Physical Exam BP 130/74  Pulse 73  Temp(Src) 98.6 F (37 C) (Oral)  Resp 16  Ht 5\' 5"  (1.651 m)  Wt 191 lb (86.637 kg)  BMI 31.78 kg/m2  SpO2 99% Patient alert and oriented and in no cardiopulmonary distress.Ill appearing  HEENT: No facial asymmetry, EOMI,   oropharynx pink and moist.No pharyngeal exudate  Neck supple no JVD, no mass. TM clear bilaterally, nasal mucosa erythematous and edematous, clear nasal drainage anteriorly, no cervical adenopathy Chest: Clear to auscultation bilaterally.  CVS: S1, S2 no murmurs, no S3.Regular rate.  ABD: Soft non tender.   Ext: No edema  MS: Adequate ROM spine, shoulders, hips and knees.  Skin: Intact, no ulcerations or rash noted.  Psych: Good eye contact, normal affect. Memory intact mildly  anxious notepressed appearing.  CNS: CN 2-12 intact, power,  normal throughout.no focal deficits noted.        Assessment & Plan:  Sore throat Rapid strep negative, allergy based and likely viral, symptomatic  treatment  Only call back if worsens  ALLERGIC RHINITIS CAUSE UNSPECIFIED Uncontrolled, commitment to daily triple treatment discussed and advised  ESSENTIAL HYPERTENSION, BENIGN Controlled, no change in medication   Diabetes mellitus, insulin dependent (IDDM), uncontrolled Improved pt applauded on this  Updated lab needed at/ before next visit. Patient advised to reduce carb and sweets, commit to regular physical activity, take meds as prescribed, test blood as directed, and attempt to lose weight, to improve blood sugar control.

## 2013-12-04 NOTE — Telephone Encounter (Signed)
Patient aware, appt scheduled and referred

## 2013-12-04 NOTE — Assessment & Plan Note (Signed)
Improved pt applauded on this  Updated lab needed at/ before next visit. Patient advised to reduce carb and sweets, commit to regular physical activity, take meds as prescribed, test blood as directed, and attempt to lose weight, to improve blood sugar control.

## 2013-12-05 ENCOUNTER — Other Ambulatory Visit: Payer: Self-pay | Admitting: Cardiovascular Disease

## 2013-12-05 NOTE — Telephone Encounter (Signed)
Rx was sent to pharmacy electronically. 

## 2013-12-14 ENCOUNTER — Telehealth: Payer: Self-pay | Admitting: Internal Medicine

## 2013-12-14 ENCOUNTER — Telehealth: Payer: Self-pay

## 2013-12-14 NOTE — Telephone Encounter (Signed)
Patient called wanting to schedule a colonoscopy    Received a letter   Please call at [phone]

## 2013-12-18 ENCOUNTER — Other Ambulatory Visit: Payer: Self-pay

## 2013-12-18 DIAGNOSIS — Z1211 Encounter for screening for malignant neoplasm of colon: Secondary | ICD-10-CM

## 2013-12-20 NOTE — Telephone Encounter (Signed)
See separate triage.  

## 2013-12-20 NOTE — Telephone Encounter (Addendum)
Gastroenterology Pre-Procedure Review  Request Date:12/14/2013 Requesting Physician: On Recall  PATIENT REVIEW QUESTIONS: The patient responded to the following health history questions as indicated:    Pt's last colonoscopy was 10/20/2013 with Dr. Gala Romney  1. Diabetes Melitis: YES 2. Joint replacements in the past 12 months: no 3. Major health problems in the past 3 months: no 4. Has an artificial valve or MVP: no 5. Has a defibrillator: no 6. Has been advised in past to take antibiotics in advance of a procedure like teeth cleaning: no    MEDICATIONS & ALLERGIES:    Patient reports the following regarding taking any blood thinners:   Plavix? no Aspirin? no Coumadin? no  Patient confirms/reports the following medications:  Current Outpatient Prescriptions  Medication Sig Dispense Refill  . glipiZIDE (GLUCOTROL) 10 MG tablet TAKE 1 TABLET BY MOUTH TWICE DAILY  60 tablet  3  . imipramine (TOFRANIL) 25 MG tablet TAKE 4 TABLETS BY MOUTH EVERY NIGHT AT BEDTIME  120 tablet  2  . insulin glargine (LANTUS) 100 UNIT/ML injection Inject 40 Units into the skin at bedtime. PT said she takes 40-50 units daily at bedtime      . Lancets (ACCU-CHEK MULTICLIX) lancets Use as instructed three times daily dx 250.01  100 each  5  . levothyroxine (SYNTHROID) 300 MCG tablet Take 200 mcg by mouth daily before breakfast.   30 tablet  3  . metFORMIN (GLUCOPHAGE) 1000 MG tablet TAKE 1 TABLET BY MOUTH TWICE DAILY  60 tablet  3  . metoprolol (LOPRESSOR) 50 MG tablet TAKE 1 TABLET BY MOUTH TWICE DAILY  60 tablet  9  . PARoxetine (PAXIL) 20 MG tablet Take 20 mg by mouth daily. Taking every other day      . pravastatin (PRAVACHOL) 80 MG tablet Take 1 tablet (80 mg total) by mouth daily.  30 tablet  2  . quinapril (ACCUPRIL) 10 MG tablet TAKE 1 TABLET BY MOUTH EVERY DAY  30 tablet  2  . verapamil (CALAN-SR) 180 MG CR tablet Take 180 mg by mouth at bedtime.      Marland Kitchen glucose blood (ACCU-CHEK AVIVA PLUS) test strip Use  as instructed three times daily dx 250.01  100 each  5   No current facility-administered medications for this visit.    Patient confirms/reports the following allergies:  Allergies  Allergen Reactions  . Nsaids Hives  . Sulfonamide Derivatives Hives    No orders of the defined types were placed in this encounter.    AUTHORIZATION INFORMATION Primary Insurance:   ID #:   Group #:  Pre-Cert / Auth required:  Pre-Cert / Auth #:   Secondary Insurance:   ID #:   Group #:  Pre-Cert / Auth required: Pre-Cert / Auth #:   SCHEDULE INFORMATION: Procedure has been scheduled as follows:  Date: 01/15/2014         Time: 9:00 AM Location: [facility] Short Stay  This Gastroenterology Pre-Precedure Review Form is being routed to the following provider(s): R. Garfield Cornea, MD

## 2013-12-20 NOTE — Telephone Encounter (Signed)
OK to schedule.  Given phenergan 12.5mg  IV 30 minutes before procedure due to polypharmacy.  Day of prep: Glipizide 5mg  BID Lantus 20 units at bedtime Metformin 500mg  BID

## 2013-12-25 ENCOUNTER — Other Ambulatory Visit: Payer: Self-pay

## 2013-12-25 DIAGNOSIS — Z1211 Encounter for screening for malignant neoplasm of colon: Secondary | ICD-10-CM

## 2013-12-25 MED ORDER — PEG-KCL-NACL-NASULF-NA ASC-C 100 G PO SOLR: 1.0000 | ORAL | Status: DC

## 2013-12-25 NOTE — Telephone Encounter (Signed)
Phenergan order has been added.

## 2013-12-25 NOTE — Telephone Encounter (Signed)
Rx sent to the pharmacy and instructions mailed to pt.  

## 2013-12-27 ENCOUNTER — Other Ambulatory Visit: Payer: Self-pay | Admitting: Family Medicine

## 2013-12-28 DIAGNOSIS — E785 Hyperlipidemia, unspecified: Secondary | ICD-10-CM | POA: Diagnosis not present

## 2013-12-28 DIAGNOSIS — E109 Type 1 diabetes mellitus without complications: Secondary | ICD-10-CM | POA: Diagnosis not present

## 2013-12-29 LAB — COMPLETE METABOLIC PANEL WITH GFR
ALT: 18 U/L (ref 0–35)
AST: 19 U/L (ref 0–37)
Albumin: 4.2 g/dL (ref 3.5–5.2)
Alkaline Phosphatase: 45 U/L (ref 39–117)
BUN: 11 mg/dL (ref 6–23)
CO2: 29 mEq/L (ref 19–32)
Calcium: 8.7 mg/dL (ref 8.4–10.5)
Chloride: 100 mEq/L (ref 96–112)
Creat: 0.71 mg/dL (ref 0.50–1.10)
GFR, Est African American: >89 mL/min
GFR, Est Non African American: 87 mL/min
Glucose, Bld: 164 mg/dL — ABNORMAL HIGH (ref 70–99)
Potassium: 4.2 mEq/L (ref 3.5–5.3)
Sodium: 141 mEq/L (ref 135–145)
Total Bilirubin: 0.4 mg/dL (ref 0.2–1.2)
Total Protein: 7.7 g/dL (ref 6.0–8.3)

## 2013-12-29 LAB — LIPID PANEL
Cholesterol: 204 mg/dL — ABNORMAL HIGH (ref 0–200)
HDL: 46 mg/dL (ref >39)
LDL Cholesterol: 132 mg/dL — ABNORMAL HIGH (ref 0–99)
Total CHOL/HDL Ratio: 4.4 Ratio
Triglycerides: 130 mg/dL (ref <150)
VLDL: 26 mg/dL (ref 0–40)

## 2013-12-29 LAB — HEMOGLOBIN A1C
Hgb A1c MFr Bld: 9.2 % — ABNORMAL HIGH (ref <5.7)
Mean Plasma Glucose: 217 mg/dL — ABNORMAL HIGH (ref <117)

## 2013-12-31 ENCOUNTER — Encounter: Payer: Self-pay | Admitting: Family Medicine

## 2013-12-31 ENCOUNTER — Ambulatory Visit (INDEPENDENT_AMBULATORY_CARE_PROVIDER_SITE_OTHER): Payer: Medicare Other | Admitting: Family Medicine

## 2013-12-31 VITALS — BP 104/64 | HR 62 | Resp 18 | Ht 65.0 in | Wt 195.1 lb

## 2013-12-31 DIAGNOSIS — I2510 Atherosclerotic heart disease of native coronary artery without angina pectoris: Secondary | ICD-10-CM

## 2013-12-31 DIAGNOSIS — E1065 Type 1 diabetes mellitus with hyperglycemia: Secondary | ICD-10-CM | POA: Diagnosis not present

## 2013-12-31 DIAGNOSIS — Z9181 History of falling: Secondary | ICD-10-CM

## 2013-12-31 DIAGNOSIS — E038 Other specified hypothyroidism: Secondary | ICD-10-CM | POA: Diagnosis not present

## 2013-12-31 DIAGNOSIS — E785 Hyperlipidemia, unspecified: Secondary | ICD-10-CM

## 2013-12-31 DIAGNOSIS — IMO0001 Reserved for inherently not codable concepts without codable children: Secondary | ICD-10-CM

## 2013-12-31 DIAGNOSIS — E1165 Type 2 diabetes mellitus with hyperglycemia: Secondary | ICD-10-CM

## 2013-12-31 DIAGNOSIS — Z23 Encounter for immunization: Secondary | ICD-10-CM | POA: Insufficient documentation

## 2013-12-31 DIAGNOSIS — Z0000 Encounter for general adult medical examination without abnormal findings: Secondary | ICD-10-CM

## 2013-12-31 DIAGNOSIS — R296 Repeated falls: Secondary | ICD-10-CM

## 2013-12-31 DIAGNOSIS — Z794 Long term (current) use of insulin: Secondary | ICD-10-CM

## 2013-12-31 NOTE — Assessment & Plan Note (Addendum)
deteriorated Pt has been skipping lantus dises due to cost , will change to mixed more affordable cost in next 2 to 3 weeks when she finishes what she has Referred to class, and nurse to call weekly for update She will be more diligent with diet and exercise If remains uncontrolled will need to be trated by endo, she understands  Foot exam today shows reduced sensation and calluses , which qualifies her for diabetic shoes

## 2013-12-31 NOTE — Assessment & Plan Note (Addendum)
Uncontrolled Hyperlipidemia:Low fat diet discussed and encouraged.  Add zetia to pravachol, of note pt reports muscle aches with other statins than pravachol

## 2013-12-31 NOTE — Assessment & Plan Note (Signed)
Annual exam as documented. Counseling done  re healthy lifestyle involving commitment to 150 minutes exercise per week, heart healthy diet, and attaining healthy weight.The importance of adequate sleep also discussed. Regular seat belt use and home safety, is also discussed.She is considered high fall risk due to occurrence of 2 falls in the last 12 months, and  Changes in health habits are decided on by the patient with goals and time frames  set for achieving them. Immunization is updated, flu vaccine administered  and cancer screening is curently up to date.

## 2013-12-31 NOTE — Progress Notes (Signed)
Preventive Screening-Counseling & Management   Patient present here today for a Medicare initial annual wellness visit.   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    Risk Factors  Current exercise habits:  Stays active as much as possible and rides stationary bicycle, on avg twice per week needs to commit to at least 5 days per week   Dietary issues discussed: Low carb diet , also needs to reduce fried and fatty foods, lipids are elevated, have risen and are uncontrolled   Cardiac risk factors: cad, DM, pt requesting imaging study with stress test of some type , will send message to her cardiologist about this  Depression Screen  (Note: if answer to either of the following is "Yes", a more complete depression screening is indicated)  Currently sees therapist and takes meds for depression which are effective Over the past two weeks, have you felt down, depressed or hopeless? No  Over the past two weeks, have you felt little interest or pleasure in doing things? No  Have you lost interest or pleasure in daily life? No  Do you often feel hopeless? No  Do you cry easily over simple problems? No   Activities of Daily Living  In your present state of health, do you have any difficulty performing the following activities?  Driving?: No Managing money?: No Feeding yourself?:No Getting from bed to chair?:No Climbing a flight of stairs?:No Preparing food and eating?:No Bathing or showering?:No Getting dressed?:No Getting to the toilet?:No Using the toilet?:No Moving around from place to place?: No  Fall Risk Assessment In the past year have you fallen or had a near fall?: Yes Are you currently taking any medications that make you dizzy?:No   Hearing Difficulties: No Do you often ask people to speak up or repeat themselves?:No Do you  experience ringing or noises in your ears?:No Do you have difficulty understanding soft or whispered voices?:No  Cognitive Testing  Alert? Yes Normal Appearance?Yes  Oriented to person? Yes Place? Yes  Time? Yes  Displays appropriate judgment?Yes  Can read the correct time from a watch face? yes Are you having problems remembering things?No  Advanced Directives have been discussed with the patient?Yes , full code   List the Names of Other Physician/Practitioners you currently use: care teams updated    Indicate any recent Medical Services you may have received from other than [facility] providers in the past year (date may be approximate).   Assessment:    Annual Wellness Exam   Plan:    During the course of the visit the patient was educated and counseled about appropriate screening and preventive services including:  A healthy diet is rich in fruit, vegetables and whole grains. Poultry fish, nuts and beans are a healthy choice for protein rather then red meat. A low sodium diet and drinking 64 ounces of water daily is generally recommended. Oils and sweet should be limited. Carbohydrates especially for those who are diabetic or overweight, should be limited to 30-45 gram per meal. It is important to eat on a regular schedule, at least 3 times daily. Snacks should be primarily fruits, vegetables or nuts. It is important that you exercise regularly at least 30 minutes 5 times a week. If you develop chest pain, have severe difficulty breathing, or feel very tired, stop exercising immediately and seek medical attention  Immunization reviewed and updated.  Cancer screening reviewed and updated    Patient Instructions (the written plan) was given to the patient.  Medicare Attestation  I have personally reviewed:  The patient's medical and social history  Their use of alcohol, tobacco or illicit drugs  Their current medications and supplements  The patient's functional ability including  ADLs,fall risks, home safety risks, cognitive, and hearing and visual impairment  Diet and physical activities  Evidence for depression or mood disorders  The patient's weight, height, BMI, and visual acuity have been recorded in the chart. I have made referrals, counseling, and provided education to the patient based on review of the above and I have provided the patient with a written personalized care plan for preventive services.   Medicare annual wellness visit, initial Annual exam as documented. Counseling done  re healthy lifestyle involving commitment to 150 minutes exercise per week, heart healthy diet, and attaining healthy weight.The importance of adequate sleep also discussed. Regular seat belt use and home safety, is also discussed.She is considered high fall risk due to occurrence of 2 falls in the last 12 months, and  Changes in health habits are decided on by the patient with goals and time frames  set for achieving them. Immunization is updated, flu vaccine administered  and cancer screening is curently up to date.   Need for prophylactic vaccination and inoculation against influenza Vaccine administered at visit.   Hyperlipidemia LDL goal <70 Uncontrolled Hyperlipidemia:Low fat diet discussed and encouraged.  Add zetia to pravachol, of note pt reports muscle aches with other statins than pravachol  Diabetes mellitus, insulin dependent (IDDM), uncontrolled deteriorated Pt has been skipping lantus dises due to cost , will change to mixed more affordable cost in next 2 to 3 weeks when she finishes what she has Referred to class, and nurse to call weekly for update She will be more diligent with diet and exercise If remains uncontrolled will need to be trated by endo, she understands  Foot exam today shows reduced sensation and calluses , which qualifies her for diabetic shoes

## 2013-12-31 NOTE — Assessment & Plan Note (Signed)
Vaccine administered at visit.  

## 2013-12-31 NOTE — Patient Instructions (Addendum)
F/u in 3 months, call if you need me before  Flu vaccine today  Be careul no to fall   Take lantus 590 units daily, when you finish what you have you will be switched to a more cost effective insulin, pls provide us with your preferred med  List  Nurse will call weekly, so we can adjust doses  New additional med zetia for cholesterol if able to afford, BUT very impt to reduce fried and fatty food  , Cholesterol is now too high  You are referred to diabetic class,.  You need to record and test blood sugars three times daily, blood sugar has increased  HBA1C fasting lipid, cmp and EGFr and hBA1C in 3 month  Foot exam today qualifies ou for diabetic shoes  Fall Prevention and Home Safety Falls cause injuries and can affect all age groups. It is possible to prevent falls.  HOW TO PREVENT FALLS  Wear shoes with rubber soles that do not have an opening for your toes.  Keep the inside and outside of your house well lit.  Use night lights throughout your home.  Remove clutter from floors.  Clean up floor spills.  Remove throw rugs or fasten them to the floor with carpet tape.  Do not place electrical cords across pathways.  Put grab bars by your tub, shower, and toilet. Do not use towel bars as grab bars.  Put handrails on both sides of the stairway. Fix loose handrails.  Do not climb on stools or stepladders, if possible.  Do not wax your floors.  Repair uneven or unsafe sidewalks, walkways, or stairs.  Keep items you use a lot within reach.  Be aware of pets.  Keep emergency numbers next to the telephone.  Put smoke detectors in your home and near bedrooms. Ask your doctor what other things you can do to prevent falls. Document Released: 01/02/2009 Document Revised: 09/07/2011 Document Reviewed: 06/08/2011 ExitCare Patient Information 2015 [employer]. This information is not intended to replace advice given to you by your health care provider. Make sure you  discuss any questions you have with your health care provider.      

## 2014-01-01 ENCOUNTER — Other Ambulatory Visit: Payer: Self-pay

## 2014-01-01 DIAGNOSIS — E785 Hyperlipidemia, unspecified: Secondary | ICD-10-CM

## 2014-01-01 MED ORDER — EZETIMIBE 10 MG PO TABS: 10.0000 mg | ORAL_TABLET | Freq: Every day | ORAL | Status: DC

## 2014-01-02 ENCOUNTER — Encounter (HOSPITAL_COMMUNITY): Payer: Self-pay | Admitting: Pharmacy Technician

## 2014-01-08 ENCOUNTER — Telehealth: Payer: Self-pay | Admitting: Internal Medicine

## 2014-01-08 MED ORDER — PEG-KCL-NACL-NASULF-NA ASC-C 100 G PO SOLR: 1.0000 | ORAL | Status: DC

## 2014-01-08 NOTE — Telephone Encounter (Signed)
Pt has not received any instructions on her upcoming procedure on 10/27. Please call her today at [phone]

## 2014-01-08 NOTE — Telephone Encounter (Signed)
Printed instructions and sent to RX. Pt coming by for instructions.

## 2014-01-08 NOTE — Telephone Encounter (Signed)
Pt has not received any instructions on her procedure scheduled for 10/27. Please call [phone]

## 2014-01-13 ENCOUNTER — Other Ambulatory Visit: Payer: Self-pay | Admitting: Family Medicine

## 2014-01-14 ENCOUNTER — Telehealth: Payer: Self-pay | Admitting: *Deleted

## 2014-01-14 ENCOUNTER — Telehealth: Payer: Self-pay

## 2014-01-14 MED ORDER — SODIUM CHLORIDE 0.9 % IV SOLN
INTRAVENOUS | Status: DC
Start: 2014-01-14 — End: 2014-01-14

## 2014-01-14 NOTE — Telephone Encounter (Signed)
noted 

## 2014-01-14 NOTE — Telephone Encounter (Signed)
Office visit made for patient with Dr Gwenlyn Found

## 2014-01-14 NOTE — Telephone Encounter (Signed)
Message copied by Chauncy Lean on Mon Jan 14, 2014  9:49 AM ------      Message from: Lorretta Harp      Created: Sat Jan 12, 2014  2:53 PM      Regarding: RE: help pls       Cindra Eves make patient a ROV to discuss.            JJB      ----- Message -----         From: Fayrene Helper, MD         Sent: 12/31/2013   8:59 PM           To: Lorretta Harp, MD, Fayrene Helper, MD      Subject: help pls                                                 Pt seen today for wellness visit.      Though hse has no c/o chest pain or fatigue, she is requesting "more than an EKG" as  In some type of stress test to eval her CAD, states nothing like that done for several years      Her diabetes and lipid status are both uncontrolled       Told her that I would make you aware.      Thanks        ------

## 2014-01-14 NOTE — Telephone Encounter (Signed)
I called pt to update triage and she has not had any change in her meds since she was triaged.

## 2014-01-15 ENCOUNTER — Ambulatory Visit (HOSPITAL_COMMUNITY)
Admission: RE | Admit: 2014-01-15 | Discharge: 2014-01-15 | Disposition: A | Discharge status: Home / Self Care | Payer: Medicare Other | Source: Ambulatory Visit | Attending: Internal Medicine | Admitting: Internal Medicine

## 2014-01-15 ENCOUNTER — Encounter (HOSPITAL_COMMUNITY)
Admission: RE | Disposition: A | Discharge status: Home / Self Care | Payer: Self-pay | Source: Ambulatory Visit | Attending: Internal Medicine

## 2014-01-15 ENCOUNTER — Encounter (HOSPITAL_COMMUNITY): Payer: Self-pay | Admitting: *Deleted

## 2014-01-15 DIAGNOSIS — Z794 Long term (current) use of insulin: Secondary | ICD-10-CM | POA: Diagnosis not present

## 2014-01-15 DIAGNOSIS — I2510 Atherosclerotic heart disease of native coronary artery without angina pectoris: Secondary | ICD-10-CM | POA: Insufficient documentation

## 2014-01-15 DIAGNOSIS — D123 Benign neoplasm of transverse colon: Secondary | ICD-10-CM | POA: Insufficient documentation

## 2014-01-15 DIAGNOSIS — K5730 Diverticulosis of large intestine without perforation or abscess without bleeding: Secondary | ICD-10-CM | POA: Diagnosis not present

## 2014-01-15 DIAGNOSIS — Z79899 Other long term (current) drug therapy: Secondary | ICD-10-CM | POA: Insufficient documentation

## 2014-01-15 DIAGNOSIS — Z1211 Encounter for screening for malignant neoplasm of colon: Secondary | ICD-10-CM | POA: Diagnosis not present

## 2014-01-15 DIAGNOSIS — F418 Other specified anxiety disorders: Secondary | ICD-10-CM | POA: Diagnosis not present

## 2014-01-15 DIAGNOSIS — E780 Pure hypercholesterolemia: Secondary | ICD-10-CM | POA: Insufficient documentation

## 2014-01-15 DIAGNOSIS — Q438 Other specified congenital malformations of intestine: Secondary | ICD-10-CM | POA: Insufficient documentation

## 2014-01-15 DIAGNOSIS — Z86010 Personal history of colonic polyps: Secondary | ICD-10-CM

## 2014-01-15 DIAGNOSIS — E785 Hyperlipidemia, unspecified: Secondary | ICD-10-CM | POA: Insufficient documentation

## 2014-01-15 DIAGNOSIS — I10 Essential (primary) hypertension: Secondary | ICD-10-CM | POA: Diagnosis not present

## 2014-01-15 DIAGNOSIS — E119 Type 2 diabetes mellitus without complications: Secondary | ICD-10-CM | POA: Diagnosis not present

## 2014-01-15 HISTORY — DX: Pure hypercholesterolemia, unspecified: E78.00

## 2014-01-15 HISTORY — DX: Depression, unspecified: F32.A

## 2014-01-15 HISTORY — PX: COLONOSCOPY: SHX5424

## 2014-01-15 HISTORY — DX: Major depressive disorder, single episode, unspecified: F32.9

## 2014-01-15 SURGERY — COLONOSCOPY
Anesthesia: Moderate Sedation

## 2014-01-15 MED ORDER — STERILE WATER FOR IRRIGATION IR SOLN
Status: DC | PRN
  Administered 2014-01-15: 10:00:00

## 2014-01-15 MED ORDER — MIDAZOLAM HCL 5 MG/5ML IJ SOLN
INTRAMUSCULAR | Status: AC
  Filled 2014-01-15: qty 10

## 2014-01-15 MED ORDER — MIDAZOLAM HCL 5 MG/5ML IJ SOLN
INTRAMUSCULAR | Status: DC | PRN
  Administered 2014-01-15 (×2): 1 mg via INTRAVENOUS
  Administered 2014-01-15: 2 mg via INTRAVENOUS
  Administered 2014-01-15 (×2): 1 mg via INTRAVENOUS

## 2014-01-15 MED ORDER — ONDANSETRON HCL 4 MG/2ML IJ SOLN
INTRAMUSCULAR | Status: AC
  Filled 2014-01-15: qty 2

## 2014-01-15 MED ORDER — PROMETHAZINE HCL 25 MG/ML IJ SOLN: 12.5000 mg | Freq: Once | INTRAMUSCULAR | Status: DC

## 2014-01-15 MED ORDER — MEPERIDINE HCL 100 MG/ML IJ SOLN
INTRAMUSCULAR | Status: AC
  Filled 2014-01-15: qty 2

## 2014-01-15 MED ORDER — ONDANSETRON HCL 4 MG/2ML IJ SOLN
INTRAMUSCULAR | Status: DC | PRN
  Administered 2014-01-15: 4 mg via INTRAVENOUS

## 2014-01-15 MED ORDER — MEPERIDINE HCL 100 MG/ML IJ SOLN
INTRAMUSCULAR | Status: DC | PRN
  Administered 2014-01-15: 50 mg via INTRAVENOUS

## 2014-01-15 MED ORDER — SODIUM CHLORIDE 0.9 % IV SOLN
INTRAVENOUS | Status: DC
  Administered 2014-01-15: 08:00:00 via INTRAVENOUS

## 2014-01-15 NOTE — H&P (Signed)
'@LOGO' @   Primary Care Physician:  Tula Nakayama, MD Primary Gastroenterologist:  Dr. Gala Romney  Pre-Procedure History & Physical: HPI:  Jennifer Blackburn is a 68 y.o. female is here for a screening colonoscopy. No bowel symptoms. No family history of colon cancer. Last colonoscopy 10 years ago.  Past Medical History  Diagnosis Date  . DJD (degenerative joint disease) of lumbar spine   . CAD (coronary artery disease)   . Diabetes mellitus type II     without complication  . Hypertension     benign   . Anxiety   . Thyroid cancer 2001  . Hyperlipidemia   . Hypercholesteremia   . Depression     Past Surgical History  Procedure Laterality Date  . Vesicovaginal fistula closure w/ tah    . Thyroidectomy    . Knee surgery Right     arthroscopy  . Tonsillectomy    . Left hip replaced  07/01/2010  . Spine surgery  2006    cervical  . Abdominal hysterectomy    . Joint replacement  07/01/2010    left hip  . Cataract extraction w/phaco Right 07/17/2012    Procedure: CATARACT EXTRACTION PHACO AND INTRAOCULAR LENS PLACEMENT (IOC);  Surgeon: Tonny Branch, MD;  Location: [facility];  Service: Ophthalmology;  Laterality: Right;  CDE:25.51  . Colonoscopy      Prior to Admission medications   Medication Sig Start Date End Date Taking? Authorizing Provider  CINNAMON PO Take by mouth.   Yes Historical Provider, MD  glipiZIDE (GLUCOTROL) 10 MG tablet Take 10 mg by mouth 2 (two) times daily.   Yes Historical Provider, MD  glucose blood (ACCU-CHEK AVIVA PLUS) test strip Use as instructed three times daily dx 250.01 05/07/13  Yes Fayrene Helper, MD  ibuprofen (ADVIL,MOTRIN) 200 MG tablet Take 400 mg by mouth daily.   Yes Historical Provider, MD  imipramine (TOFRANIL) 25 MG tablet Take 50 mg by mouth at bedtime.   Yes Historical Provider, MD  insulin glargine (LANTUS) 100 UNIT/ML injection Inject 40 Units into the skin at bedtime. PT said she takes 40-50 units daily at bedtime   Yes Historical  Provider, MD  Lancets (ACCU-CHEK MULTICLIX) lancets Use as instructed three times daily dx 250.01 05/07/13  Yes Fayrene Helper, MD  levothyroxine (SYNTHROID) 200 MCG tablet Take 1 tablet (200 mcg total) by mouth daily before breakfast. 12/31/13  Yes Fayrene Helper, MD  Menthol-Methyl Salicylate (MUSCLE RUB) 10-15 % CREA Apply 1 application topically as needed for muscle pain.   Yes Historical Provider, MD  metFORMIN (GLUCOPHAGE) 1000 MG tablet TAKE 1 TABLET BY MOUTH TWICE DAILY   Yes Fayrene Helper, MD  metoprolol (LOPRESSOR) 50 MG tablet Take 50 mg by mouth 2 (two) times daily.   Yes Historical Provider, MD  PARoxetine (PAXIL) 20 MG tablet Take 20 mg by mouth daily. Taking every other day 05/02/13  Yes Fayrene Helper, MD  peg 3350 powder (MOVIPREP) 100 G SOLR Take 1 kit (200 g total) by mouth as directed. 01/08/14  Yes Daneil Dolin, MD  pravastatin (PRAVACHOL) 80 MG tablet Take 80 mg by mouth daily.   Yes Historical Provider, MD  quinapril (ACCUPRIL) 10 MG tablet Take 10 mg by mouth daily.   Yes Historical Provider, MD  tretinoin (RETIN-A) 0.05 % cream Apply 1 application topically every other day.   Yes Historical Provider, MD  verapamil (CALAN-SR) 180 MG CR tablet Take 180 mg by mouth at bedtime.   Yes  Historical Provider, MD  ezetimibe (ZETIA) 10 MG tablet Take 1 tablet (10 mg total) by mouth daily. 01/01/14   Fayrene Helper, MD  glipiZIDE (GLUCOTROL) 10 MG tablet TAKE 1 TABLET BY MOUTH TWICE DAILY 01/14/14   Fayrene Helper, MD    Allergies as of 12/18/2013 - Review Complete 12/14/2013  Allergen Reaction Noted  . Nsaids Hives 04/14/2007  . Sulfonamide derivatives Hives 04/14/2007    Family History  Problem Relation Age of Onset  . Heart attack Father   . Heart failure Mother   . Colon cancer Neg Hx     History   Social History  . Marital Status: Married    Spouse Name: N/A    Number of Children: N/A  . Years of Education: N/A   Occupational History  .  retired     Social History Main Topics  . Smoking status: Passive Smoke Exposure - Never Smoker  . Smokeless tobacco: Never Used  . Alcohol Use: No  . Drug Use: No  . Sexual Activity: Not on file   Other Topics Concern  . Not on file   Social History Narrative  . No narrative on file    Review of Systems: See HPI, otherwise negative ROS  Physical Exam: BP 126/54  Pulse 57  Temp(Src) 98.2 F (36.8 C) (Oral)  Resp 17  Ht '5\' 5"'  (1.651 m)  Wt 195 lb (88.451 kg)  BMI 32.45 kg/m2  SpO2 100% General:   Alert,  Well-developed, well-nourished, pleasant and cooperative in NAD Head:  Normocephalic and atraumatic. Eyes:  Sclera clear, no icterus.   Conjunctiva pink. Ears:  Normal auditory acuity. Nose:  No deformity, discharge,  or lesions. Mouth:  No deformity or lesions, dentition normal. Neck:  Supple; no masses or thyromegaly. Lungs:  Clear throughout to auscultation.   No wheezes, crackles, or rhonchi. No acute distress. Heart:  Regular rate and rhythm; no murmurs, clicks, rubs,  or gallops. Abdomen:  Soft, nontender and nondistended. No masses, hepatosplenomegaly or hernias noted. Normal bowel sounds, without guarding, and without rebound.   Msk:  Symmetrical without gross deformities. Normal posture. Pulses:  Normal pulses noted. Extremities:  Without clubbing or edema. Neurologic:  Alert and  oriented x4;  grossly normal neurologically. Skin:  Intact without significant lesions or rashes. Cervical Nodes:  No significant cervical adenopathy. Psych:  Alert and cooperative. Normal mood and affect.  Impression/Plan: Jennifer Blackburn is now here to undergo a screening colonoscopy.  Average risk screening examination.  Risks, benefits, limitations, imponderables and alternatives regarding colonoscopy have been reviewed with the patient. Questions have been answered. All parties agreeable.     Notice:  This dictation was prepared with Dragon dictation along with smaller  phrase technology. Any transcriptional errors that result from this process are unintentional and may not be corrected upon review.

## 2014-01-15 NOTE — Op Note (Signed)
[facility] [address] [place], [address]   COLONOSCOPY PROCEDURE REPORT  PATIENT: Jennifer Blackburn, Jennifer Blackburn  MR#: [id number] BIRTHDATE: 15-May-1945 , 11  yrs. old GENDER: female ENDOSCOPIST: R.  Garfield Cornea, MD FACP [facility] REFERRED HW:[id number] Moshe Cipro, M.D. PROCEDURE DATE:  02/01/2014 PROCEDURE:   Colonoscopy with snare polypectomy INDICATIONS:Average risk colorectal cancer screening examination. MEDICATIONS: Versed 6 mg IV and Demerol 50 mg IV in divided doses. Zofran 4 mg IV. ASA CLASS:       Class II  CONSENT: The risks, benefits, alternatives and imponderables including but not limited to bleeding, perforation as well as the possibility of a missed lesion have been reviewed.  The potential for biopsy, lesion removal, etc. have also been discussed. Questions have been answered.  All parties agreeable.  Please see the history and physical in the medical record for more information.  DESCRIPTION OF PROCEDURE:   After the risks benefits and alternatives of the procedure were thoroughly explained, informed consent was obtained.  The digital rectal exam revealed no abnormalities of the rectum.   The EC-[id number] ([id number])  endoscope was introduced through the anus and advanced to the cecum, which was identified by both the appendix and ileocecal valve. No adverse events experienced.   The quality of the prep was adequate.  The instrument was then slowly withdrawn as the colon was fully examined.      COLON FINDINGS: Normal rectum.  Redundant colon with deep haustral folds and colonic diverticula.  The procedure required changing in the patient's position and external abdominal pressure to reach the cecum.  The patient also had (1) 8 mm pedunculated polyp at the hepatic flexure; otherwise, the remainder of the colonic mucosa appeared normal. The polyp was hot snare removed and recovered for the pathologist. Retroflexed views revealed no abnormalities.  .  Withdrawal time=9 minutes 0 seconds.  The scope was withdrawn and the procedure completed. COMPLICATIONS: There were no immediate complications.  ENDOSCOPIC IMPRESSION: Redundant colon. Pancolonic diverticulosis. Colonic polyp?"removed as described above  RECOMMENDATIONS: Follow up on pathology.  eSigned:  R. Garfield Cornea, MD Rosalita Chessman [facility] 2014-02-01 10:18 AM   cc:  CPT CODES: ICD CODES:  The ICD and CPT codes recommended by this software are interpretations from the data that the clinical staff has captured with the software.  The verification of the translation of this report to the ICD and CPT codes and modifiers is the sole responsibility of the health care institution and practicing physician where this report was generated.  [place]. will not be held responsible for the validity of the ICD and CPT codes included on this report.  AMA assumes no liability for data contained or not contained herein. CPT is a Designer, television/film set of the [employer].

## 2014-01-15 NOTE — Discharge Instructions (Addendum)
Colonoscopy Discharge Instructions  Read the instructions outlined below and refer to this sheet in the next few weeks. These discharge instructions provide you with general information on caring for yourself after you leave the hospital. Your doctor may also give you specific instructions. While your treatment has been planned according to the most current medical practices available, unavoidable complications occasionally occur. If you have any problems or questions after discharge, call Dr. Gala Romney at [phone]. ACTIVITY  You may resume your regular activity, but move at a slower pace for the next 24 hours.   Take frequent rest periods for the next 24 hours.   Walking will help get rid of the air and reduce the bloated feeling in your belly (abdomen).   No driving for 24 hours (because of the medicine (anesthesia) used during the test).    Do not sign any important legal documents or operate any machinery for 24 hours (because of the anesthesia used during the test).  NUTRITION  Drink plenty of fluids.   You may resume your normal diet as instructed by your doctor.   Begin with a light meal and progress to your normal diet. Heavy or fried foods are harder to digest and may make you feel sick to your stomach (nauseated).   Avoid alcoholic beverages for 24 hours or as instructed.  MEDICATIONS  You may resume your normal medications unless your doctor tells you otherwise.  WHAT YOU CAN EXPECT TODAY  Some feelings of bloating in the abdomen.   Passage of more gas than usual.   Spotting of blood in your stool or on the toilet paper.  IF YOU HAD POLYPS REMOVED DURING THE COLONOSCOPY:  No aspirin products for 7 days or as instructed.   No alcohol for 7 days or as instructed.   Eat a soft diet for the next 24 hours.  FINDING OUT THE RESULTS OF YOUR TEST Not all test results are available during your visit. If your test results are not back during the visit, make an appointment  with your caregiver to find out the results. Do not assume everything is normal if you have not heard from your caregiver or the medical facility. It is important for you to follow up on all of your test results.  SEEK IMMEDIATE MEDICAL ATTENTION IF:  You have more than a spotting of blood in your stool.   Your belly is swollen (abdominal distention).   You are nauseated or vomiting.   You have a temperature over 101.   You have abdominal pain or discomfort that is severe or gets worse throughout the day.   Colonoscopy and diverticulosis information provided  Further recommendations to follow pending review of pathology  Report.     Colonoscopy, Care After Refer to this sheet in the next few weeks. These instructions provide you with information on caring for yourself after your procedure. Your health care provider may also give you more specific instructions. Your treatment has been planned according to current medical practices, but problems sometimes occur. Call your health care provider if you have any problems or questions after your procedure. WHAT TO EXPECT AFTER THE PROCEDURE  After your procedure, it is typical to have the following:  A small amount of blood in your stool.  Moderate amounts of gas and mild abdominal cramping or bloating. HOME CARE INSTRUCTIONS  Do not drive, operate machinery, or sign important documents for 24 hours.  You may shower and resume your regular physical activities, but move at  a slower pace for the first 24 hours.  Take frequent rest periods for the first 24 hours.  Walk around or put a warm pack on your abdomen to help reduce abdominal cramping and bloating.  Drink enough fluids to keep your urine clear or pale yellow.  You may resume your normal diet as instructed by your health care provider. Avoid heavy or fried foods that are hard to digest.  Avoid drinking alcohol for 24 hours or as instructed by your health care provider.  Only  take over-the-counter or prescription medicines as directed by your health care provider.  If a tissue sample (biopsy) was taken during your procedure:  Do not take aspirin or blood thinners for 7 days, or as instructed by your health care provider.  Do not drink alcohol for 7 days, or as instructed by your health care provider.  Eat soft foods for the first 24 hours. SEEK MEDICAL CARE IF: You have persistent spotting of blood in your stool 2-3 days after the procedure. SEEK IMMEDIATE MEDICAL CARE IF:  You have more than a small spotting of blood in your stool.  You pass large blood clots in your stool.  Your abdomen is swollen (distended).  You have nausea or vomiting.  You have a fever.  You have increasing abdominal pain that is not relieved with medicine. Document Released: 10/21/2003 Document Revised: 12/27/2012 Document Reviewed: 11/13/2012 [facility] Patient Information 2015 [place], [place]. This information is not intended to replace advice given to you by your health care provider. Make sure you discuss any questions you have with your health care provider.  Diverticulosis Diverticulosis is the condition that develops when small pouches (diverticula) form in the wall of your colon. Your colon, or large intestine, is where water is absorbed and stool is formed. The pouches form when the inside layer of your colon pushes through weak spots in the outer layers of your colon. CAUSES  No one knows exactly what causes diverticulosis. RISK FACTORS  Being older than 67. Your risk for this condition increases with age. Diverticulosis is rare in people younger than 40 years. By age 21, almost everyone has it.  Eating a low-fiber diet.  Being frequently constipated.  Being overweight.  Not getting enough exercise.  Smoking.  Taking over-the-counter pain medicines, like aspirin and ibuprofen. SYMPTOMS  Most people with diverticulosis do not have symptoms. DIAGNOSIS    Because diverticulosis often has no symptoms, health care providers often discover the condition during an exam for other colon problems. In many cases, a health care provider will diagnose diverticulosis while using a flexible scope to examine the colon (colonoscopy). TREATMENT  If you have never developed an infection related to diverticulosis, you may not need treatment. If you have had an infection before, treatment may include:  Eating more fruits, vegetables, and grains.  Taking a fiber supplement.  Taking a live bacteria supplement (probiotic).  Taking medicine to relax your colon. HOME CARE INSTRUCTIONS   Drink at least 6-8 glasses of water each day to prevent constipation.  Try not to strain when you have a bowel movement.  Keep all follow-up appointments. If you have had an infection before:  Increase the fiber in your diet as directed by your health care provider or dietitian.  Take a dietary fiber supplement if your health care provider approves.  Only take medicines as directed by your health care provider. SEEK MEDICAL CARE IF:   You have abdominal pain.  You have bloating.  You have  cramps.  You have not gone to the bathroom in 3 days. SEEK IMMEDIATE MEDICAL CARE IF:   Your pain gets worse.  Yourbloating becomes very bad.  You have a fever or chills, and your symptoms suddenly get worse.  You begin vomiting.  You have bowel movements that are bloody or black. MAKE SURE YOU:  Understand these instructions.  Will watch your condition.  Will get help right away if you are not doing well or get worse. Document Released: 12/04/2003 Document Revised: 03/13/2013 Document Reviewed: 01/31/2013 [facility] Patient Information 2015 [place], [place]. This information is not intended to replace advice given to you by your health care provider. Make sure you discuss any questions you have with your health care provider.

## 2014-01-16 LAB — GLUCOSE, CAPILLARY: Glucose-Capillary: 141 mg/dL — ABNORMAL HIGH (ref 70–99)

## 2014-01-16 NOTE — Telephone Encounter (Signed)
See other note dated 01/08/2014.

## 2014-01-17 ENCOUNTER — Encounter: Payer: Self-pay | Admitting: Internal Medicine

## 2014-01-17 ENCOUNTER — Encounter (HOSPITAL_COMMUNITY): Payer: Self-pay | Admitting: Internal Medicine

## 2014-01-18 ENCOUNTER — Telehealth: Payer: Self-pay | Admitting: Family Medicine

## 2014-01-18 NOTE — Telephone Encounter (Signed)
I have not called patient.  

## 2014-01-22 NOTE — Telephone Encounter (Signed)
Called and left message for patient to return call re: blood sugars

## 2014-01-23 ENCOUNTER — Telehealth: Payer: Self-pay | Admitting: Internal Medicine

## 2014-01-23 NOTE — Telephone Encounter (Signed)
PATIENT HAS QUESTIONS ABOUT THE RESULTS LETTER SHE RECEIVED IN THE MAIL ABOUT HER COLONOSCOPY.  PLEASE CALL HER SHE IS WORRIED.  [phone]

## 2014-01-24 NOTE — Telephone Encounter (Signed)
I talked to pt and explained her results to her.

## 2014-01-25 ENCOUNTER — Ambulatory Visit: Payer: Self-pay | Admitting: Nutrition

## 2014-01-25 ENCOUNTER — Encounter: Payer: Medicare Other | Admitting: Nutrition

## 2014-02-01 ENCOUNTER — Encounter: Payer: Self-pay | Admitting: Nutrition

## 2014-02-01 ENCOUNTER — Encounter: Payer: Medicare Other | Attending: Family Medicine | Admitting: Nutrition

## 2014-02-01 VITALS — Ht 65.0 in | Wt 194.5 lb

## 2014-02-01 DIAGNOSIS — E1165 Type 2 diabetes mellitus with hyperglycemia: Secondary | ICD-10-CM

## 2014-02-01 DIAGNOSIS — IMO0002 Reserved for concepts with insufficient information to code with codable children: Secondary | ICD-10-CM

## 2014-02-01 DIAGNOSIS — Z794 Long term (current) use of insulin: Secondary | ICD-10-CM | POA: Insufficient documentation

## 2014-02-01 DIAGNOSIS — E118 Type 2 diabetes mellitus with unspecified complications: Secondary | ICD-10-CM | POA: Insufficient documentation

## 2014-02-01 DIAGNOSIS — Z713 Dietary counseling and surveillance: Secondary | ICD-10-CM | POA: Insufficient documentation

## 2014-02-01 NOTE — Progress Notes (Signed)
  Medical Nutrition Therapy:  Appt start time: 1000 end time:  1100.   Assessment:  Primary concerns today: DIabetes. Lives with her husband. Retired. Most recent A1C was 9%. Takiing 50 units Lantus daily with Metformin 1000 mg BID and Glipiizide 10 mg BID. Uses exercise bike 20 minutes 4 times per week. Wants to improve BS and lose weight.  Preferred Learning Style:   No preference indicated   Learning Readiness:     Ready  Change in progress   MEDICATIONS: see lis   DIETARY INTAKE:   24-hr recall:  B ( AM): Toast with graded cheese or boiled egg, coffee or skim milk OR 1/2 banana or [place] sausage Snk ( AM):  2-3 crackers or piece of fruit-apple water L ( PM): skipped Snk ( PM): Diet beverage and waffle fries at [employer] D ( PM): Cabbage, corn beef and corn beef hash,  Snk ( PM): 1  Bottle water and sf jello Beverages: water, diet sodas  Usual physical activity: rides her exercise bike a few times per week.  Estimated energy needs: 1500 calories 170 g carbohydrates 112 g protein 42 g fat  Progress Towards Goal(s):  In progress.   Nutritional Diagnosis:     Intervention:  Nutrition counseling and diabetes education Plan:  Aim for 2-3 Carb Choices per meal (30-45 grams) +/- 1 either way  No snacks between meals Measure foods out for portion control Do not skip meals. Cut out diet sodas. Include protein in moderation with your meals Increasing your activity level by 30-60 minutes daily as tolerated Check blood sugars fasting and at bedtime daily and record on BS log.  Continue to take medication as directed by MD Lantus 50 units per day, 1000 MG of Metformin and Glipizide daily. Goal: Lose 1-2 lbs per week. 2. Get A1C down to 7% in three months.   Teaching Method Utilized:Visual Auditory Hands on  Handouts given during visit include: My Plate Carb Counting and Food Label handouts  Meal Plan Card  The Plate Method  Barriers to learning/adherence  to lifestyle change: none Demonstrated degree of understanding via:  Teach Back   Monitoring/Evaluation:  Dietary intake, exercise, meal planning, SBG and body weight in 1 month.

## 2014-02-04 ENCOUNTER — Telehealth: Payer: Self-pay | Admitting: Family Medicine

## 2014-02-04 ENCOUNTER — Other Ambulatory Visit: Payer: Self-pay | Admitting: Family Medicine

## 2014-02-05 MED ORDER — INSULIN DETEMIR 100 UNIT/ML FLEXPEN: 50.0000 [IU] | PEN_INJECTOR | Freq: Every day | SUBCUTANEOUS | Status: DC

## 2014-02-05 NOTE — Telephone Encounter (Signed)
levemir at same dose pls see if affordable to her

## 2014-02-05 NOTE — Telephone Encounter (Signed)
Noted  

## 2014-02-05 NOTE — Telephone Encounter (Signed)
Patient would like to know if alternative insulin can be sent in the her pharmacy.  She is now out of Lantus.

## 2014-02-05 NOTE — Addendum Note (Signed)
Addended by: Denman George B on: 02/05/2014 03:45 PM   Modules accepted: Orders

## 2014-02-05 NOTE — Telephone Encounter (Signed)
Med sent.  Patient will call office if medicine is too expensive.    She did see dietician.    Her cbgs have been 151 today,  141 and 196 on Monday, and 142 and 198 on Sunday

## 2014-02-05 NOTE — Telephone Encounter (Signed)
Doing better, pls remind her to be sure to get the HBA1C when next due

## 2014-02-05 NOTE — Telephone Encounter (Signed)
Pt called with questions regarding her medications. Pt stated she called yesterday and never heard anything back. Please advise [phone]

## 2014-02-06 ENCOUNTER — Telehealth: Payer: Self-pay | Admitting: *Deleted

## 2014-02-06 NOTE — Telephone Encounter (Signed)
Pt called requesting to speak with Loma Sousa. Please advise [phone]

## 2014-02-07 ENCOUNTER — Other Ambulatory Visit: Payer: Self-pay

## 2014-02-07 MED ORDER — INSULIN LISPRO PROT & LISPRO (75-25 MIX) 100 UNIT/ML KWIKPEN: 15.0000 [IU] | PEN_INJECTOR | Freq: Two times a day (BID) | SUBCUTANEOUS | Status: DC

## 2014-02-07 NOTE — Telephone Encounter (Signed)
She is currently taking 50 units of Lantus.   Free voucher for all Humalog products available.

## 2014-02-07 NOTE — Telephone Encounter (Signed)
Patient states that the price of Levemir is more than the Lantus.  Would you like to try Humalog 75/25?

## 2014-02-07 NOTE — Telephone Encounter (Addendum)
Yes pls verify current dose med she is taking, also may need to do humulin N may beeven more cost effective, let me know

## 2014-02-07 NOTE — Patient Instructions (Signed)
Plan:  Aim for 2-3 Carb Choices per meal (30-45 grams) +/- 1 either way  No snacks between meals Measure foods out for portion control Do not skip meals. Cut out diet sodas. Include protein in moderation with your meals Increasing your activity level by 30-60 minutes daily as tolerated Check blood sugars fasting and at bedtime daily and record on BS log.  Continue to take medication as directed by MD 50 units of Lantus daily, 1000 mg of Metformin BID and Glipizide. Goal: Lose 1-2 lbs per week. 2. Get A1C down to 7% in three months.

## 2014-02-07 NOTE — Telephone Encounter (Signed)
Patient aware and med sent to pharmacy.  Med list corrected.

## 2014-02-07 NOTE — Addendum Note (Signed)
Addended by: Denman George B on: 02/07/2014 03:35 PM   Modules accepted: Orders

## 2014-02-13 DIAGNOSIS — E1169 Type 2 diabetes mellitus with other specified complication: Secondary | ICD-10-CM | POA: Diagnosis not present

## 2014-02-13 DIAGNOSIS — C73 Malignant neoplasm of thyroid gland: Secondary | ICD-10-CM | POA: Diagnosis not present

## 2014-02-19 ENCOUNTER — Ambulatory Visit (INDEPENDENT_AMBULATORY_CARE_PROVIDER_SITE_OTHER): Payer: Medicare Other | Admitting: Cardiovascular Disease

## 2014-02-19 ENCOUNTER — Encounter: Payer: Self-pay | Admitting: Cardiovascular Disease

## 2014-02-19 VITALS — BP 118/64 | HR 67 | Ht 65.0 in | Wt 194.2 lb

## 2014-02-19 DIAGNOSIS — I2510 Atherosclerotic heart disease of native coronary artery without angina pectoris: Secondary | ICD-10-CM | POA: Diagnosis not present

## 2014-02-19 DIAGNOSIS — I10 Essential (primary) hypertension: Secondary | ICD-10-CM | POA: Diagnosis not present

## 2014-02-19 DIAGNOSIS — E785 Hyperlipidemia, unspecified: Secondary | ICD-10-CM | POA: Diagnosis not present

## 2014-02-19 NOTE — Assessment & Plan Note (Signed)
History of hyperlipidemia on Zetia 10 mg and pravastatin 80 mg a day. Her most recent lipid profile performed 12/28/13 revealed a total cholesterol 204, LDL 132 and HDL of 46. She does admit to dietary indiscretion and has recently seen the dietitian. She is followed for this by her primary care physician

## 2014-02-19 NOTE — Assessment & Plan Note (Signed)
History of hypertension with blood pressure measured today 118/64. She is on verapamil SR 180 mg a day, quinapril 10 mg a day and metoprolol. Continue current medications at current dosing

## 2014-02-19 NOTE — Patient Instructions (Signed)
Your physician wants you to follow-up in: 1 year with Dr Berry. You will receive a reminder letter in the mail two months in advance. If you don't receive a letter, please call our office to schedule the follow-up appointment.  

## 2014-02-19 NOTE — Progress Notes (Signed)
02/19/2014 Jennifer Blackburn   1945/07/01  [id number]  Primary Physician Tula Nakayama, MD Primary Cardiologist: Lorretta Harp MD Renae Gloss   HPI:  The patient is a 68 year old, moderately overweight, married [place] American female, mother of 2, grandmother to 4 grandchildren who I last saw in the office 6 months ago. She has a history of minimal CAD by cath back in June 2005 with normal LV function. Her other problems include treated hypertension, diabetes and dyslipidemia.  She does have a strong family history of heart disease with a father that died of an MI at age 23. Most recent lipid profile performed by Dr. Moshe Cipro on 12/28/13 revealed a total cholesterol 204, LDL of 132 and HDL 46. Dr. Moshe Cipro continues to follow her with profile . She denies chest pain or shortness of breath. She does admit to dietary indiscretion and recently saw dietitian.  Current Outpatient Prescriptions  Medication Sig Dispense Refill  . CINNAMON PO Take by mouth.    . ezetimibe (ZETIA) 10 MG tablet Take 1 tablet (10 mg total) by mouth daily. 30 tablet 5  . glipiZIDE (GLUCOTROL) 10 MG tablet Take 10 mg by mouth 2 (two) times daily.    Marland Kitchen glipiZIDE (GLUCOTROL) 10 MG tablet TAKE 1 TABLET BY MOUTH TWICE DAILY 60 tablet 3  . glucose blood (ACCU-CHEK AVIVA PLUS) test strip Use as instructed three times daily dx 250.01 100 each 5  . ibuprofen (ADVIL,MOTRIN) 200 MG tablet Take 400 mg by mouth daily.    Marland Kitchen imipramine (TOFRANIL) 25 MG tablet Take 50 mg by mouth at bedtime.    . Insulin Lispro Prot & Lispro (HUMALOG 75/25 MIX) (75-25) 100 UNIT/ML Kwikpen Inject 15 Units into the skin 2 (two) times daily with a meal. 15 mL 1  . Lancets (ACCU-CHEK MULTICLIX) lancets Use as instructed three times daily dx 250.01 100 each 5  . levothyroxine (SYNTHROID) 200 MCG tablet Take 1 tablet (200 mcg total) by mouth daily before breakfast. 30 tablet 5  . Menthol-Methyl Salicylate (MUSCLE RUB) 10-15 % CREA Apply 1  application topically as needed for muscle pain.    . metFORMIN (GLUCOPHAGE) 1000 MG tablet TAKE 1 TABLET BY MOUTH TWICE DAILY 60 tablet 3  . metoprolol (LOPRESSOR) 50 MG tablet Take 50 mg by mouth 2 (two) times daily.    Marland Kitchen PARoxetine (PAXIL) 20 MG tablet Take 20 mg by mouth daily. Taking every other day    . peg 3350 powder (MOVIPREP) 100 G SOLR Take 1 kit (200 g total) by mouth as directed. 1 kit 0  . pravastatin (PRAVACHOL) 80 MG tablet Take 80 mg by mouth daily.    . quinapril (ACCUPRIL) 10 MG tablet Take 10 mg by mouth daily.    Marland Kitchen tretinoin (RETIN-A) 0.05 % cream Apply 1 application topically every other day.    . verapamil (CALAN-SR) 180 MG CR tablet Take 180 mg by mouth at bedtime.     No current facility-administered medications for this visit.    Allergies  Allergen Reactions  . Nsaids Hives  . Sulfonamide Derivatives Hives    History   Social History  . Marital Status: Married    Spouse Name: N/A    Number of Children: N/A  . Years of Education: N/A   Occupational History  . retired     Social History Main Topics  . Smoking status: Passive Smoke Exposure - Never Smoker  . Smokeless tobacco: Never Used  . Alcohol Use: No  .  Drug Use: No  . Sexual Activity: Not on file   Other Topics Concern  . Not on file   Social History Narrative     Review of Systems: General: negative for chills, fever, night sweats or weight changes.  Cardiovascular: negative for chest pain, dyspnea on exertion, edema, orthopnea, palpitations, paroxysmal nocturnal dyspnea or shortness of breath Dermatological: negative for rash Respiratory: negative for cough or wheezing Urologic: negative for hematuria Abdominal: negative for nausea, vomiting, diarrhea, bright red blood per rectum, melena, or hematemesis Neurologic: negative for visual changes, syncope, or dizziness All other systems reviewed and are otherwise negative except as noted above.    Blood pressure 118/64, pulse 67,  height _0  (1.651 m), weight 194 lb 3.2 oz (88.089 kg).  General appearance: alert and no distress Neck: no adenopathy, no carotid bruit, no JVD, supple, symmetrical, trachea midline and thyroid not enlarged, symmetric, no tenderness/mass/nodules Lungs: clear to auscultation bilaterally Heart: regular rate and rhythm, S1, S2 normal, no murmur, click, rub or gallop Extremities: extremities normal, atraumatic, no cyanosis or edema  EKG not performed today  ASSESSMENT AND PLAN:   ESSENTIAL HYPERTENSION, BENIGN History of hypertension with blood pressure measured today 118/64. She is on verapamil SR 180 mg a day, quinapril 10 mg a day and metoprolol. Continue current medications at current dosing  Hyperlipidemia LDL goal <70 History of hyperlipidemia on Zetia 10 mg and pravastatin 80 mg a day. Her most recent lipid profile performed 12/28/13 revealed a total cholesterol 204, LDL 132 and HDL of 46. She does admit to dietary indiscretion and has recently seen the dietitian. She is followed for this by her primary care physician      Lorretta Harp MD [facility], [facility] 02/19/2014 2:40 PM

## 2014-02-20 DIAGNOSIS — C73 Malignant neoplasm of thyroid gland: Secondary | ICD-10-CM | POA: Diagnosis not present

## 2014-02-20 DIAGNOSIS — E1159 Type 2 diabetes mellitus with other circulatory complications: Secondary | ICD-10-CM | POA: Diagnosis not present

## 2014-02-20 DIAGNOSIS — Z6832 Body mass index (BMI) 32.0-32.9, adult: Secondary | ICD-10-CM | POA: Diagnosis not present

## 2014-02-20 DIAGNOSIS — E669 Obesity, unspecified: Secondary | ICD-10-CM | POA: Diagnosis not present

## 2014-02-20 DIAGNOSIS — E785 Hyperlipidemia, unspecified: Secondary | ICD-10-CM | POA: Diagnosis not present

## 2014-02-20 DIAGNOSIS — I10 Essential (primary) hypertension: Secondary | ICD-10-CM | POA: Diagnosis not present

## 2014-02-20 DIAGNOSIS — E039 Hypothyroidism, unspecified: Secondary | ICD-10-CM | POA: Diagnosis not present

## 2014-02-21 ENCOUNTER — Telehealth: Payer: Self-pay | Admitting: Family Medicine

## 2014-02-21 NOTE — Telephone Encounter (Addendum)
Let her know next HBA1C due end Dec, and we will resume the lantus in Jan

## 2014-02-21 NOTE — Telephone Encounter (Signed)
Please advise what dose you would like to start patient on.

## 2014-02-23 ENCOUNTER — Other Ambulatory Visit: Payer: Self-pay | Admitting: Family Medicine

## 2014-02-25 ENCOUNTER — Telehealth: Payer: Self-pay

## 2014-02-25 ENCOUNTER — Telehealth: Payer: Self-pay | Admitting: Family Medicine

## 2014-02-25 NOTE — Telephone Encounter (Signed)
pls enter HBa1C of 8.4 on 11/25/205 Let her know improved,I heard result from Dr Carmela Rima  Needs to increase the dose of her insulin, if takin humalog 15 units twice daily inc to 18 units twice daily  ??opls ask, also adjust doses on record also , thankas

## 2014-02-25 NOTE — Telephone Encounter (Signed)
Loma Sousa has a message for her and will call her back.

## 2014-02-26 MED ORDER — INSULIN GLARGINE 100 UNIT/ML SOLOSTAR PEN: 55.0000 [IU] | PEN_INJECTOR | Freq: Every day | SUBCUTANEOUS | Status: DC

## 2014-02-26 NOTE — Addendum Note (Signed)
Addended by: Denman George B on: 02/26/2014 09:09 AM   Modules accepted: Orders, Medications

## 2014-02-26 NOTE — Telephone Encounter (Signed)
Patient had a1c with another provider.  No need for repeat see next telephone message.

## 2014-02-26 NOTE — Telephone Encounter (Signed)
Patient aware.  Lantus sent to requested pharmacy with dose of 55 units daily.   Will followup with patient on 12/11 to get blood sugar readings.

## 2014-03-06 ENCOUNTER — Encounter: Payer: Self-pay | Admitting: Pediatrics

## 2014-03-06 ENCOUNTER — Ambulatory Visit (INDEPENDENT_AMBULATORY_CARE_PROVIDER_SITE_OTHER): Payer: Medicare Other | Admitting: Psychiatry

## 2014-03-06 DIAGNOSIS — F411 Generalized anxiety disorder: Secondary | ICD-10-CM | POA: Diagnosis not present
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
## 2014-03-06 NOTE — Patient Instructions (Signed)
Discussed orally 

## 2014-03-09 ENCOUNTER — Other Ambulatory Visit: Payer: Self-pay | Admitting: Family Medicine

## 2014-04-02 ENCOUNTER — Encounter: Payer: Self-pay | Admitting: Family Medicine

## 2014-04-02 ENCOUNTER — Ambulatory Visit: Payer: Medicare Other | Admitting: Family Medicine

## 2014-04-02 ENCOUNTER — Ambulatory Visit (INDEPENDENT_AMBULATORY_CARE_PROVIDER_SITE_OTHER): Payer: Medicare Other | Admitting: Family Medicine

## 2014-04-02 VITALS — BP 130/70 | HR 78 | Temp 98.0°F | Resp 16 | Ht 65.0 in | Wt 198.0 lb

## 2014-04-02 DIAGNOSIS — E785 Hyperlipidemia, unspecified: Secondary | ICD-10-CM

## 2014-04-02 DIAGNOSIS — E1065 Type 1 diabetes mellitus with hyperglycemia: Secondary | ICD-10-CM | POA: Diagnosis not present

## 2014-04-02 DIAGNOSIS — F419 Anxiety disorder, unspecified: Secondary | ICD-10-CM

## 2014-04-02 DIAGNOSIS — F329 Major depressive disorder, single episode, unspecified: Secondary | ICD-10-CM

## 2014-04-02 DIAGNOSIS — E1165 Type 2 diabetes mellitus with hyperglycemia: Secondary | ICD-10-CM

## 2014-04-02 DIAGNOSIS — E669 Obesity, unspecified: Secondary | ICD-10-CM

## 2014-04-02 DIAGNOSIS — Z1211 Encounter for screening for malignant neoplasm of colon: Secondary | ICD-10-CM | POA: Diagnosis not present

## 2014-04-02 DIAGNOSIS — F418 Other specified anxiety disorders: Secondary | ICD-10-CM

## 2014-04-02 DIAGNOSIS — Z13820 Encounter for screening for osteoporosis: Secondary | ICD-10-CM

## 2014-04-02 DIAGNOSIS — J0100 Acute maxillary sinusitis, unspecified: Secondary | ICD-10-CM | POA: Diagnosis not present

## 2014-04-02 DIAGNOSIS — E038 Other specified hypothyroidism: Secondary | ICD-10-CM | POA: Diagnosis not present

## 2014-04-02 DIAGNOSIS — IMO0001 Reserved for inherently not codable concepts without codable children: Secondary | ICD-10-CM

## 2014-04-02 DIAGNOSIS — F32A Depression, unspecified: Secondary | ICD-10-CM

## 2014-04-02 DIAGNOSIS — I10 Essential (primary) hypertension: Secondary | ICD-10-CM | POA: Diagnosis not present

## 2014-04-02 DIAGNOSIS — E66811 Obesity, class 1: Secondary | ICD-10-CM

## 2014-04-02 DIAGNOSIS — Z794 Long term (current) use of insulin: Secondary | ICD-10-CM

## 2014-04-02 LAB — HEMOGLOBIN A1C: A1c: 8.4

## 2014-04-02 MED ORDER — AZITHROMYCIN 250 MG PO TABS: ORAL_TABLET | ORAL | Status: DC

## 2014-04-02 MED ORDER — PAROXETINE HCL 20 MG PO TABS: 20.0000 mg | ORAL_TABLET | Freq: Every day | ORAL | Status: DC

## 2014-04-02 NOTE — Progress Notes (Signed)
   Subjective:    Patient ID: Jennifer Blackburn, female    DOB: [birth year], 69 y.o.   MRN: [id number]  HPI The PT is here for follow up and re-evaluation of chronic medical conditions, medication management and review of any available recent lab and radiology data.  Preventive health is updated, specifically  Cancer screening and Immunization.   Questions or concerns regarding consultations or procedures which the PT has had in the interim are  Addressed.Has seen both cardiology and endo since last visit, also seeing therapist monhtly.Zetia added by cardiology, hBa1C improved when checked by endo The PT denies any adverse reactions to current medications since the last visit.  1 week h/o head congestion, pressure and ear fullness, post nasal drainage thick and at times yello, was coughing but better, intermittent chills and low grade fever     Review of Systems See HPI  Denies chest pains, palpitations and leg swelling Denies abdominal pain, nausea, vomiting,diarrhea or constipation.   Denies dysuria, frequency, hesitancy or incontinence. Denies joint pain, swelling and limitation in mobility. Denies headaches, seizures, numbness, or tingling. Denies depression, anxiety or insomnia. Denies skin break down or rash.        Objective:   Physical Exam BP 130/70 mmHg  Pulse 78  Temp(Src) 98 F (36.7 C) (Oral)  Resp 16  Ht 5\' 5"  (1.651 m)  Wt 198 lb (89.812 kg)  BMI 32.95 kg/m2  SpO2 96% Patient alert and oriented and in no cardiopulmonary distress.  HEENT: No facial asymmetry, EOMI,   oropharynx pink and moist.  Neck supple no JVD, no mass. Maxillary sinus tenderness, TM clear bilaterally, ;left anterior cervical adenitis Chest: Clear to auscultation bilaterally.  CVS: S1, S2 no murmurs, no S3.Regular rate.  ABD: Soft non tender.   Ext: No edema  MS: Adequate ROM spine, shoulders, hips and knees.  Skin: Intact, no ulcerations or rash noted.  Psych: Good eye contact,  normal affect. Memory intact not anxious or depressed appearing.  CNS: CN 2-12 intact, power,  normal throughout.no focal deficits noted.        Assessment & Plan:  Sinusitis, acute maxillary Antibiotic course and saline nasal flushes   ESSENTIAL HYPERTENSION, BENIGN Controlled, no change in medication DASH diet and commitment to daily physical activity for a minimum of 30 minutes discussed and encouraged, as a part of hypertension management. The importance of attaining a healthy weight is also discussed.    Diabetes mellitus, insulin dependent (IDDM), uncontrolled Improved, but still above goal Patient advised to reduce carb and sweets, commit to regular physical activity, take meds as prescribed, test blood as directed, and attempt to lose weight, to improve blood sugar control. Increase lantus to 55 units Updated lab needed at/ before next visit.    Hypothyroidism Followed by endo and controlled   Hyperlipidemia LDL goal <70 Uncontrolled, zetia recently added Hyperlipidemia:Low fat diet discussed and encouraged.  Updated lab needed at/ before next visit.    Obesity (BMI 30.0-34.9) Unchanged. Patient re-educated about  the importance of commitment to a  minimum of 150 minutes of exercise per week. The importance of healthy food choices with portion control discussed. Encouraged to start a food diary, count calories and to consider  joining a support group. Sample diet sheets offered. Goals set by the patient for the next several months.      Anxiety and depression Improved on higher dose of paxil and back in therapy Not suicidal or homicidal, less overwhelmed

## 2014-04-02 NOTE — Patient Instructions (Addendum)
F/u mid March, all the best for 2016, call if you need me before  Z pack sent in for sinusitis  Pleas increase lantus to 55 units and continue healthy eating plan  Commit to 30 mins daily of physical activity as able   Goal for fasting blood sugar ranges from 90 to 130 and 2 hours after any meal or at bedtime should be between 130 to 180.  Fasting lipid, cmp and EgFR, HBA1C, microalb in March  You are referred for bone density testing

## 2014-04-03 ENCOUNTER — Ambulatory Visit: Payer: Self-pay | Admitting: Family Medicine

## 2014-04-03 NOTE — Assessment & Plan Note (Signed)
Uncontrolled, zetia recently added Hyperlipidemia:Low fat diet discussed and encouraged.  Updated lab needed at/ before next visit.

## 2014-04-03 NOTE — Assessment & Plan Note (Signed)
Controlled, no change in medication DASH diet and commitment to daily physical activity for a minimum of 30 minutes discussed and encouraged, as a part of hypertension management. The importance of attaining a healthy weight is also discussed.  

## 2014-04-03 NOTE — Assessment & Plan Note (Signed)
Antibiotic course and saline nasal flushes

## 2014-04-03 NOTE — Assessment & Plan Note (Signed)
Unchanged. Patient re-educated about  the importance of commitment to a  minimum of 150 minutes of exercise per week. The importance of healthy food choices with portion control discussed. Encouraged to start a food diary, count calories and to consider  joining a support group. Sample diet sheets offered. Goals set by the patient for the next several months.    

## 2014-04-03 NOTE — Assessment & Plan Note (Signed)
Improved, but still above goal Patient advised to reduce carb and sweets, commit to regular physical activity, take meds as prescribed, test blood as directed, and attempt to lose weight, to improve blood sugar control. Increase lantus to 55 units Updated lab needed at/ before next visit.

## 2014-04-03 NOTE — Progress Notes (Signed)
Order(s) created erroneously. Erroneous order ID: [id number]  Order moved by: Alfonso Patten  Order move date/time: 04/03/2014 2:36 PM  Source Patient: [id number]  Source Contact: 04/02/2014  Destination Patient: [id number]  Destination Contact: 06/06/2012

## 2014-04-03 NOTE — Assessment & Plan Note (Signed)
Improved on higher dose of paxil and back in therapy Not suicidal or homicidal, less overwhelmed

## 2014-04-03 NOTE — Assessment & Plan Note (Signed)
Followed by endo and controlled 

## 2014-04-09 ENCOUNTER — Ambulatory Visit (HOSPITAL_COMMUNITY)
Admission: RE | Admit: 2014-04-09 | Discharge: 2014-04-09 | Disposition: A | Discharge status: Home / Self Care | Payer: Medicare Other | Source: Ambulatory Visit | Attending: Family Medicine | Admitting: Family Medicine

## 2014-04-09 DIAGNOSIS — Z780 Asymptomatic menopausal state: Secondary | ICD-10-CM | POA: Insufficient documentation

## 2014-04-09 DIAGNOSIS — Z90710 Acquired absence of both cervix and uterus: Secondary | ICD-10-CM | POA: Insufficient documentation

## 2014-04-09 DIAGNOSIS — M810 Age-related osteoporosis without current pathological fracture: Secondary | ICD-10-CM | POA: Insufficient documentation

## 2014-04-09 DIAGNOSIS — M898X9 Other specified disorders of bone, unspecified site: Secondary | ICD-10-CM | POA: Insufficient documentation

## 2014-04-09 DIAGNOSIS — Z13820 Encounter for screening for osteoporosis: Secondary | ICD-10-CM | POA: Insufficient documentation

## 2014-04-09 DIAGNOSIS — M1990 Unspecified osteoarthritis, unspecified site: Secondary | ICD-10-CM | POA: Insufficient documentation

## 2014-04-09 DIAGNOSIS — Z794 Long term (current) use of insulin: Secondary | ICD-10-CM | POA: Insufficient documentation

## 2014-04-09 DIAGNOSIS — E1065 Type 1 diabetes mellitus with hyperglycemia: Secondary | ICD-10-CM | POA: Insufficient documentation

## 2014-04-11 ENCOUNTER — Encounter: Payer: Self-pay | Admitting: Family Medicine

## 2014-04-11 ENCOUNTER — Ambulatory Visit (HOSPITAL_COMMUNITY)
Admission: RE | Admit: 2014-04-11 | Discharge: 2014-04-11 | Disposition: A | Discharge status: Home / Self Care | Payer: Medicare Other | Source: Ambulatory Visit | Attending: Family Medicine | Admitting: Family Medicine

## 2014-04-11 ENCOUNTER — Ambulatory Visit (INDEPENDENT_AMBULATORY_CARE_PROVIDER_SITE_OTHER): Payer: Medicare Other | Admitting: Family Medicine

## 2014-04-11 VITALS — BP 140/80 | HR 82 | Resp 16 | Ht 65.0 in | Wt 198.0 lb

## 2014-04-11 DIAGNOSIS — R0789 Other chest pain: Secondary | ICD-10-CM

## 2014-04-11 DIAGNOSIS — K219 Gastro-esophageal reflux disease without esophagitis: Secondary | ICD-10-CM

## 2014-04-11 DIAGNOSIS — R42 Dizziness and giddiness: Secondary | ICD-10-CM | POA: Diagnosis not present

## 2014-04-11 DIAGNOSIS — J3089 Other allergic rhinitis: Secondary | ICD-10-CM | POA: Diagnosis not present

## 2014-04-11 DIAGNOSIS — R079 Chest pain, unspecified: Secondary | ICD-10-CM | POA: Insufficient documentation

## 2014-04-11 DIAGNOSIS — I10 Essential (primary) hypertension: Secondary | ICD-10-CM | POA: Diagnosis not present

## 2014-04-11 DIAGNOSIS — C73 Malignant neoplasm of thyroid gland: Secondary | ICD-10-CM | POA: Diagnosis not present

## 2014-04-11 DIAGNOSIS — E1065 Type 1 diabetes mellitus with hyperglycemia: Secondary | ICD-10-CM | POA: Diagnosis not present

## 2014-04-11 DIAGNOSIS — E785 Hyperlipidemia, unspecified: Secondary | ICD-10-CM

## 2014-04-11 DIAGNOSIS — E119 Type 2 diabetes mellitus without complications: Secondary | ICD-10-CM | POA: Diagnosis not present

## 2014-04-11 DIAGNOSIS — I2510 Atherosclerotic heart disease of native coronary artery without angina pectoris: Secondary | ICD-10-CM | POA: Insufficient documentation

## 2014-04-11 DIAGNOSIS — IMO0001 Reserved for inherently not codable concepts without codable children: Secondary | ICD-10-CM

## 2014-04-11 DIAGNOSIS — Z794 Long term (current) use of insulin: Secondary | ICD-10-CM

## 2014-04-11 DIAGNOSIS — E1165 Type 2 diabetes mellitus with hyperglycemia: Secondary | ICD-10-CM

## 2014-04-11 MED ORDER — TRIAMCINOLONE ACETONIDE 55 MCG/ACT NA AERO: 2.0000 | INHALATION_SPRAY | Freq: Every day | NASAL | Status: DC

## 2014-04-11 MED ORDER — ESOMEPRAZOLE MAGNESIUM 40 MG PO PACK: 40.0000 mg | PACK | Freq: Every day | ORAL | Status: DC

## 2014-04-11 NOTE — Patient Instructions (Addendum)
F/u as before  EKG today , shows no sign of acute heart damage and is normal.  You are referred back to Dr Gwenlyn Found for re evaluation of your new chest pain  I believe that your chest pain symptoms are due to reflux and have prescribed nexium for 1 month only, ALWAYS remember to avoid caffeine, I think current symptoms have a cafffeine trigger as we discussed   Swelling around the eyes is due to allergies , add nasonex daily , to claritin, and use saline nasal flushes daily also , this will cause this to improve.  Chest X ray today

## 2014-04-11 NOTE — Assessment & Plan Note (Addendum)
1 week h/o intermittent chest pain, awakened pt one night, she was afraid to go back to sleep, felt "hot" substernal, lasted 15 minutes EKG in office today, shows no acute ischemia and she is in normal sinus rhythm Will  refer back to cardiology, states she "had no chest pain" when she recently saw him, and has multiple CAD risk factors. Based on history and exam I believe the pain is due to [facility]

## 2014-04-11 NOTE — Assessment & Plan Note (Signed)
Controlled, no change in medication Hyperlipidemia:Low fat diet discussed and encouraged.  \ 

## 2014-04-11 NOTE — Assessment & Plan Note (Signed)
Controlled, no change in medication DASH diet and commitment to daily physical activity for a minimum of 30 minutes discussed and encouraged, as a part of hypertension management. The importance of attaining a healthy weight is also discussed.  

## 2014-04-11 NOTE — Progress Notes (Signed)
Subjective:    Patient ID: Jennifer Blackburn, female    DOB: 11-02-1945, 69 y.o.   MRN: [id number]  HPI  Pt in with c/o peri orbital pressure and swelling in the past week, associated with excessive sneezing, sates the cough and febrile illness is improved. States since taking on claritin tablet 2 days ago, the swelling is improved Woke out of her sleep about 1 week ago with substernal burning  Lasted about 15 min, she was afraid to go back to sleep. C/o tired feeling associated with this, also has experienced similar symptom when bending over. Maty have had regular caffeine last week, which triggered the symptom, has GERD diagnosis, has not taken PPI for months and avoids caffeine.Concerned that chest pain related to heart and wants cardiology re eval Has also noted recent shortness of breath which is concerning Denies PND, orthopnea or palpitations Denies polyuria, polydipsia, blurred vision , or hypoglycemic episodes. States she is "please d" with her blood suagrs  Review of Systems See HPI Denies recent fever or chills. Denies on, ear pain or sore throat. Denies chest congestion, productive cough or wheezing.  Denies abdominal pain, nausea, vomiting,diarrhea or constipation.   Denies dysuria, frequency, hesitancy or incontinence. Denies joint pain, swelling and limitation in mobility. Denies uncontrolled headaches, seizures, numbness, or tingling. Denies depression, anxiety or insomnia. Denies skin break down or rash.        Objective:   Physical Exam  BP 140/80 mmHg  Pulse 82  Resp 16  Ht 5\' 5"  (1.651 m)  Wt 198 lb (89.812 kg)  BMI 32.95 kg/m2  SpO2 99% Patient alert and oriented and in no cardiopulmonary distress.  HEENT: No facial asymmetry, EOMI,   oropharynx pink and moist.  Neck supple no JVD, no mass. Supraorbital edema, TM clear, oropharynx not erythematous oredematous Chest: Clear to auscultation bilaterally.No reproducible chest wall pain  CVS: S1, S2 no  murmurs, no S3.Regular rate. EKG: NSR, no ischemic chnages ABD: Soft non tender.   Ext: No edema  MS: Adequate ROM spine, shoulders, hips and knees.  Skin: Intact, no ulcerations or rash noted.  Psych: Good eye contact, normal affect. Memory intact not anxious or depressed appearing.  CNS: CN 2-12 intact, power,  normal throughout.no focal deficits noted.       Assessment & Plan:  Atypical chest pain 1 week h/o intermittent chest pain, awakened pt one night, she was afraid to go back to sleep, felt "hot" substernal, lasted 15 minutes EKG in office today, shows no acute ischemia and she is in normal sinus rhythm Will  refer back to cardiology, states she "had no chest pain" when she recently saw him, and has multiple CAD risk factors. Based on history and exam I believe the pain is due to GERd   Allergic rhinitis Uncontrolled with bilateral periorbital edema , add steroid nasal spray daily and saline flushes   GERD (gastroesophageal reflux disease) Uncontrolled symptoms following recentr potential caffeine exposure , nexium prescribed fo 1 month only Pt re educated re foods to  Avoid with GERD   Diabetes mellitus, insulin dependent (IDDM), uncontrolled Patient educated about the importance of limiting  Carbohydrate intake , the need to commit to daily physical activity for a minimum of 30 minutes , and to commit weight loss. The fact that changes in all these areas will reduce or eliminate all together the development of diabetes is stressed.   Updated lab needed at/ before next visit.    ESSENTIAL HYPERTENSION, BENIGN Controlled,  no change in medication DASH diet and commitment to daily physical activity for a minimum of 30 minutes discussed and encouraged, as a part of hypertension management. The importance of attaining a healthy weight is also discussed.

## 2014-04-11 NOTE — Assessment & Plan Note (Signed)
Uncontrolled symptoms following recentr potential caffeine exposure , nexium prescribed fo 1 month only Pt re educated re foods to  Avoid with GERD

## 2014-04-11 NOTE — Assessment & Plan Note (Signed)
Patient educated about the importance of limiting  Carbohydrate intake , the need to commit to daily physical activity for a minimum of 30 minutes , and to commit weight loss. The fact that changes in all these areas will reduce or eliminate all together the development of diabetes is stressed.   Updated lab needed at/ before next visit.  

## 2014-04-11 NOTE — Assessment & Plan Note (Signed)
Uncontrolled with bilateral periorbital edema , add steroid nasal spray daily and saline flushes

## 2014-04-17 ENCOUNTER — Ambulatory Visit (INDEPENDENT_AMBULATORY_CARE_PROVIDER_SITE_OTHER): Payer: Medicare Other | Admitting: Psychiatry

## 2014-04-17 DIAGNOSIS — F411 Generalized anxiety disorder: Secondary | ICD-10-CM
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
## 2014-04-17 NOTE — Patient Instructions (Signed)
Discussed orally 

## 2014-05-01 DIAGNOSIS — Z471 Aftercare following joint replacement surgery: Secondary | ICD-10-CM | POA: Diagnosis not present

## 2014-05-01 DIAGNOSIS — Z96642 Presence of left artificial hip joint: Secondary | ICD-10-CM | POA: Diagnosis not present

## 2014-05-03 ENCOUNTER — Encounter: Payer: Self-pay | Admitting: Physician Assistant

## 2014-05-03 ENCOUNTER — Ambulatory Visit (INDEPENDENT_AMBULATORY_CARE_PROVIDER_SITE_OTHER): Payer: Medicare Other | Admitting: Physician Assistant

## 2014-05-03 VITALS — BP 126/74 | HR 60 | Ht 64.5 in | Wt 197.7 lb

## 2014-05-03 DIAGNOSIS — E669 Obesity, unspecified: Secondary | ICD-10-CM

## 2014-05-03 DIAGNOSIS — I10 Essential (primary) hypertension: Secondary | ICD-10-CM | POA: Diagnosis not present

## 2014-05-03 DIAGNOSIS — I2510 Atherosclerotic heart disease of native coronary artery without angina pectoris: Secondary | ICD-10-CM

## 2014-05-03 DIAGNOSIS — R0789 Other chest pain: Secondary | ICD-10-CM | POA: Diagnosis not present

## 2014-05-03 NOTE — Assessment & Plan Note (Signed)
Noncardiac chest pain 3 weeks ago. Last nuclear stress test May 2011 and was nonischemic.

## 2014-05-03 NOTE — Assessment & Plan Note (Signed)
Blood pressures well-controlled.  We did discuss adding exercise and her daily routine and how important this is her overall health.

## 2014-05-03 NOTE — Assessment & Plan Note (Signed)
This pain she was describing her chest sounds noncardiac. She's had no episodes since. She was started on Nexium by her primary doctor. We did discuss the types of pain which may be considered cardiac.

## 2014-05-03 NOTE — Patient Instructions (Signed)
Your physician wants you to follow-up in: 6 Months You will receive a reminder letter in the mail two months in advance. If you don't receive a letter, please call our office to schedule the follow-up appointment.  

## 2014-05-03 NOTE — Assessment & Plan Note (Signed)
Recommended referral to a registered dietitian.

## 2014-05-03 NOTE — Progress Notes (Signed)
Patient ID: NASHLEY CORDOBA, female   DOB: 1945-07-01, 69 y.o.   MRN: [id number]    Date:  05/03/2014   ID:  CLORIS FLIPPO, DOB Apr 06, 1945, MRN [id number]  PCP:  Tula Nakayama, MD  Primary Cardiologist:  Gwenlyn Found  Chief Complaint  Patient presents with  . Follow-up     History of Present Illness: AYZIA DAY is a 69 y.o. female  The patient is a 69 year old, obese, married [place] American female, mother of 2, grandmother to 4 grandchildren who saw Dr. Gwenlyn Found in Dec 2015. She has a history of minimal CAD by cath back in June 2005 with normal LV function. Her last nuclear stress test was May 2011 and nonischemic.  Her other problems include treated hypertension, diabetes and dyslipidemia. She does have a strong family history of heart disease with a father that died of an MI at age 46. Most recent lipid profile performed by Dr. Moshe Cipro on 12/28/13 revealed a total cholesterol 204, LDL of 132 and HDL 46. Dr. Moshe Cipro continues to follow her with profile .   Patient presents today for evaluation of "hurting" in her chest. This happened one night 3 weeks ago. She describes it as waves that came and went for about 30 minutes she also had a warm feeling. There was no radiation to left arm neck back or jaw. Was no nausea vomiting or shortness of breath or diaphoresis. She's had no episodes since. She's had no chest pain or pressure with ambulation or walking upstairs. She does describe an occasional "jolt" that last just seconds in her from her back to her chest.  She otherwise denies nausea, vomiting, fever, shortness of breath, orthopnea, dizziness, PND, cough, congestion, abdominal pain, hematochezia, melena, lower extremity edema, claudication.  Wt Readings from Last 3 Encounters:  05/03/14 197 lb 11.2 oz (89.676 kg)  04/11/14 198 lb (89.812 kg)  04/02/14 198 lb (89.812 kg)     Past Medical History  Diagnosis Date  . DJD (degenerative joint disease) of lumbar spine   . CAD (coronary  artery disease)   . Diabetes mellitus type II     without complication  . Hypertension     benign   . Anxiety   . Thyroid cancer 2001  . Hyperlipidemia   . Hypercholesteremia   . Depression     Current Outpatient Prescriptions  Medication Sig Dispense Refill  . CINNAMON PO Take by mouth.    . esomeprazole (NEXIUM) 40 MG packet Take 40 mg by mouth daily before breakfast. 30 each 0  . ezetimibe (ZETIA) 10 MG tablet Take 1 tablet (10 mg total) by mouth daily. 30 tablet 5  . glipiZIDE (GLUCOTROL) 10 MG tablet TAKE 1 TABLET BY MOUTH TWICE DAILY 60 tablet 3  . glucose blood (ACCU-CHEK AVIVA PLUS) test strip Use as instructed three times daily dx 250.01 100 each 5  . imipramine (TOFRANIL) 25 MG tablet TAKE 4 TABLETS BY MOUTH EVERY NIGHT AT BEDTIME 120 tablet 2  . Insulin Glargine (LANTUS) 100 UNIT/ML Solostar Pen Inject 55 Units into the skin daily at 10 pm. 15 mL 11  . Lancets (ACCU-CHEK MULTICLIX) lancets Use as instructed three times daily dx 250.01 100 each 5  . levothyroxine (SYNTHROID) 200 MCG tablet Take 1 tablet (200 mcg total) by mouth daily before breakfast. 30 tablet 5  . Menthol-Methyl Salicylate (MUSCLE RUB) 10-15 % CREA Apply 1 application topically as needed for muscle pain.    . metFORMIN (GLUCOPHAGE) 1000 MG tablet TAKE 1 TABLET  BY MOUTH TWICE DAILY 60 tablet 3  . metoprolol (LOPRESSOR) 50 MG tablet Take 50 mg by mouth 2 (two) times daily.    Marland Kitchen PARoxetine (PAXIL) 20 MG tablet Take 1 tablet (20 mg total) by mouth daily. 30 tablet 5  . pravastatin (PRAVACHOL) 80 MG tablet Take 80 mg by mouth daily.    . quinapril (ACCUPRIL) 10 MG tablet Take 10 mg by mouth daily.    Marland Kitchen tretinoin (RETIN-A) 0.05 % cream Apply 1 application topically every other day.    . triamcinolone (NASACORT) 55 MCG/ACT AERO nasal inhaler Place 2 sprays into the nose daily. 1 Inhaler 12  . verapamil (CALAN-SR) 180 MG CR tablet Take 180 mg by mouth at bedtime.     No current facility-administered medications  for this visit.    Allergies:    Allergies  Allergen Reactions  . Nsaids Hives  . Sulfonamide Derivatives Hives    Social History:  The patient  reports that she has been passively smoking.  She has never used smokeless tobacco. She reports that she does not drink alcohol or use illicit drugs.   Family history:   Family History  Problem Relation Age of Onset  . Heart attack Father   . Heart failure Mother   . Colon cancer Neg Hx     ROS:  Please see the history of present illness.  All other systems reviewed and negative.   PHYSICAL EXAM: VS:  BP 126/74 mmHg  Pulse 60  Ht 5' 4.5" (1.638 m)  Wt 197 lb 11.2 oz (89.676 kg)  BMI 33.42 kg/m2 Obese, well developed, in no acute distress HEENT: Pupils are equal round react to light accommodation extraocular movements are intact.  Neck: no JVDNo cervical lymphadenopathy. Cardiac: Regular rate and rhythm without murmurs rubs or gallops. Lungs:  clear to auscultation bilaterally, no wheezing, rhonchi or rales Abd: soft, nontender, positive bowel sounds all quadrants, no hepatosplenomegaly Ext: no lower extremity edema.  2+ radial and dorsalis pedis pulses. Skin: warm and dry Neuro:  Grossly normal  EKG:  Sinus rhythm with a rate of 60 bpm.  ASSESSMENT AND PLAN:  Problem List Items Addressed This Visit    Obesity (BMI 30.0-34.9) - Primary    Recommended referral to a registered dietitian.      ESSENTIAL HYPERTENSION, BENIGN    Blood pressures well-controlled.  We did discuss adding exercise and her daily routine and how important this is her overall health.      Coronary atherosclerosis    Noncardiac chest pain 3 weeks ago. Last nuclear stress test May 2011 and was nonischemic.      Atypical chest pain    This pain she was describing her chest sounds noncardiac. She's had no episodes since. She was started on Nexium by her primary doctor. We did discuss the types of pain which may be considered cardiac.      Relevant  Orders   EKG 12-Lead

## 2014-05-06 ENCOUNTER — Encounter: Payer: Medicare Other | Attending: Family Medicine | Admitting: Nutrition

## 2014-05-06 DIAGNOSIS — Z713 Dietary counseling and surveillance: Secondary | ICD-10-CM | POA: Insufficient documentation

## 2014-05-06 DIAGNOSIS — E118 Type 2 diabetes mellitus with unspecified complications: Secondary | ICD-10-CM | POA: Insufficient documentation

## 2014-05-06 DIAGNOSIS — Z794 Long term (current) use of insulin: Secondary | ICD-10-CM | POA: Insufficient documentation

## 2014-05-08 ENCOUNTER — Ambulatory Visit (INDEPENDENT_AMBULATORY_CARE_PROVIDER_SITE_OTHER): Payer: Medicare Other | Admitting: Psychiatry

## 2014-05-08 DIAGNOSIS — F411 Generalized anxiety disorder: Secondary | ICD-10-CM | POA: Diagnosis not present
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
## 2014-05-08 NOTE — Patient Instructions (Signed)
Discussed orally 

## 2014-05-16 DIAGNOSIS — Z6832 Body mass index (BMI) 32.0-32.9, adult: Secondary | ICD-10-CM | POA: Diagnosis not present

## 2014-05-16 DIAGNOSIS — M545 Low back pain: Secondary | ICD-10-CM | POA: Diagnosis not present

## 2014-05-16 DIAGNOSIS — M25552 Pain in left hip: Secondary | ICD-10-CM | POA: Diagnosis not present

## 2014-05-24 ENCOUNTER — Other Ambulatory Visit: Payer: Self-pay | Admitting: Cardiovascular Disease

## 2014-05-24 NOTE — Telephone Encounter (Signed)
Rx(s) sent to pharmacy electronically.  

## 2014-05-28 DIAGNOSIS — N959 Unspecified menopausal and perimenopausal disorder: Secondary | ICD-10-CM | POA: Diagnosis not present

## 2014-05-28 DIAGNOSIS — Z1231 Encounter for screening mammogram for malignant neoplasm of breast: Secondary | ICD-10-CM | POA: Diagnosis not present

## 2014-05-28 DIAGNOSIS — Z6833 Body mass index (BMI) 33.0-33.9, adult: Secondary | ICD-10-CM | POA: Diagnosis not present

## 2014-05-31 ENCOUNTER — Other Ambulatory Visit: Payer: Self-pay | Admitting: Family Medicine

## 2014-05-31 ENCOUNTER — Ambulatory Visit (HOSPITAL_COMMUNITY): Payer: Self-pay | Admitting: Psychiatry

## 2014-06-12 ENCOUNTER — Ambulatory Visit (INDEPENDENT_AMBULATORY_CARE_PROVIDER_SITE_OTHER): Payer: Medicare Other | Admitting: Psychiatry

## 2014-06-12 DIAGNOSIS — F411 Generalized anxiety disorder: Secondary | ICD-10-CM

## 2014-06-12 NOTE — Patient Instructions (Signed)
Discussed orally 
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
## 2014-06-17 ENCOUNTER — Telehealth: Payer: Self-pay | Admitting: Family Medicine

## 2014-06-17 NOTE — Telephone Encounter (Addendum)
Pls send additional; diagnoses of osteoarthritis, bone pain, high fall risk, post menopausal , s/p TAH, osteoporosis  sreen to the coder, I will write these on a letter head if needed. I am really not clear as to why she had the test done if billing was an issue. Per her records she had not had a dexaand needed one  Pls help me with this, I have sent a message to the coder who sent me the alert which I have also copied to you  Though I had originally asked Sherry Ruffing to follow this , i am now asking for your help with this as able, thanks

## 2014-06-20 ENCOUNTER — Ambulatory Visit: Payer: Medicare Other | Admitting: Family Medicine

## 2014-07-07 ENCOUNTER — Other Ambulatory Visit: Payer: Self-pay | Admitting: Family Medicine

## 2014-07-10 ENCOUNTER — Ambulatory Visit (HOSPITAL_COMMUNITY): Payer: Self-pay | Admitting: Psychiatry

## 2014-07-10 DIAGNOSIS — E1065 Type 1 diabetes mellitus with hyperglycemia: Secondary | ICD-10-CM | POA: Diagnosis not present

## 2014-07-10 DIAGNOSIS — E785 Hyperlipidemia, unspecified: Secondary | ICD-10-CM | POA: Diagnosis not present

## 2014-07-10 DIAGNOSIS — E109 Type 1 diabetes mellitus without complications: Secondary | ICD-10-CM | POA: Diagnosis not present

## 2014-07-11 ENCOUNTER — Ambulatory Visit: Payer: Medicare Other | Admitting: Family Medicine

## 2014-07-11 LAB — HEMOGLOBIN A1C
Hgb A1c MFr Bld: 9 % — ABNORMAL HIGH (ref <5.7)
Mean Plasma Glucose: 212 mg/dL — ABNORMAL HIGH (ref <117)

## 2014-07-11 LAB — COMPLETE METABOLIC PANEL WITH GFR
ALT: 18 U/L (ref 0–35)
AST: 18 U/L (ref 0–37)
Albumin: 4.2 g/dL (ref 3.5–5.2)
Alkaline Phosphatase: 38 U/L — ABNORMAL LOW (ref 39–117)
BUN: 14 mg/dL (ref 6–23)
CO2: 29 mEq/L (ref 19–32)
Calcium: 9.1 mg/dL (ref 8.4–10.5)
Chloride: 102 mEq/L (ref 96–112)
Creat: 0.73 mg/dL (ref 0.50–1.10)
GFR, Est African American: >89 mL/min
GFR, Est Non African American: 84 mL/min
Glucose, Bld: 153 mg/dL — ABNORMAL HIGH (ref 70–99)
Potassium: 4.5 mEq/L (ref 3.5–5.3)
Sodium: 140 mEq/L (ref 135–145)
Total Bilirubin: 0.3 mg/dL (ref 0.2–1.2)
Total Protein: 7.4 g/dL (ref 6.0–8.3)

## 2014-07-11 LAB — LIPID PANEL
Cholesterol: 182 mg/dL (ref 0–200)
HDL: 44 mg/dL — ABNORMAL LOW (ref >=46)
LDL Cholesterol: 105 mg/dL — ABNORMAL HIGH (ref 0–99)
Total CHOL/HDL Ratio: 4.1 Ratio
Triglycerides: 167 mg/dL — ABNORMAL HIGH (ref <150)
VLDL: 33 mg/dL (ref 0–40)

## 2014-07-11 LAB — MICROALBUMIN / CREATININE URINE RATIO
Creatinine, Urine: 226.5 mg/dL
Microalb Creat Ratio: 20.8 mg/g (ref 0.0–30.0)
Microalb, Ur: 4.7 mg/dL — ABNORMAL HIGH (ref <2.0)

## 2014-07-15 ENCOUNTER — Encounter: Payer: Self-pay | Admitting: Family Medicine

## 2014-07-15 ENCOUNTER — Ambulatory Visit (INDEPENDENT_AMBULATORY_CARE_PROVIDER_SITE_OTHER): Payer: Medicare Other | Admitting: Family Medicine

## 2014-07-15 VITALS — BP 132/84 | HR 76 | Resp 18 | Ht 65.0 in | Wt 198.0 lb

## 2014-07-15 DIAGNOSIS — Z23 Encounter for immunization: Secondary | ICD-10-CM | POA: Diagnosis not present

## 2014-07-15 DIAGNOSIS — F32A Depression, unspecified: Secondary | ICD-10-CM

## 2014-07-15 DIAGNOSIS — Z9181 History of falling: Secondary | ICD-10-CM

## 2014-07-15 DIAGNOSIS — IMO0001 Reserved for inherently not codable concepts without codable children: Secondary | ICD-10-CM

## 2014-07-15 DIAGNOSIS — M549 Dorsalgia, unspecified: Secondary | ICD-10-CM

## 2014-07-15 DIAGNOSIS — I10 Essential (primary) hypertension: Secondary | ICD-10-CM | POA: Diagnosis not present

## 2014-07-15 DIAGNOSIS — F5102 Adjustment insomnia: Secondary | ICD-10-CM

## 2014-07-15 DIAGNOSIS — E1165 Type 2 diabetes mellitus with hyperglycemia: Principal | ICD-10-CM

## 2014-07-15 DIAGNOSIS — R296 Repeated falls: Secondary | ICD-10-CM

## 2014-07-15 DIAGNOSIS — E785 Hyperlipidemia, unspecified: Secondary | ICD-10-CM

## 2014-07-15 DIAGNOSIS — I2510 Atherosclerotic heart disease of native coronary artery without angina pectoris: Secondary | ICD-10-CM

## 2014-07-15 DIAGNOSIS — E669 Obesity, unspecified: Secondary | ICD-10-CM

## 2014-07-15 DIAGNOSIS — E1065 Type 1 diabetes mellitus with hyperglycemia: Secondary | ICD-10-CM | POA: Diagnosis not present

## 2014-07-15 DIAGNOSIS — F419 Anxiety disorder, unspecified: Secondary | ICD-10-CM

## 2014-07-15 DIAGNOSIS — F329 Major depressive disorder, single episode, unspecified: Secondary | ICD-10-CM

## 2014-07-15 DIAGNOSIS — Z794 Long term (current) use of insulin: Principal | ICD-10-CM

## 2014-07-15 DIAGNOSIS — F418 Other specified anxiety disorders: Secondary | ICD-10-CM

## 2014-07-15 NOTE — Progress Notes (Signed)
Subjective:    Patient ID: Jennifer Blackburn, female    DOB: Dec 12, 1945, 69 y.o.   MRN: [id number]  HPI   Jennifer Blackburn     MRN: [id number]      DOB: 1945-07-07   HPI Jennifer Blackburn is here for follow up and re-evaluation of chronic medical conditions, medication management and review of any available recent lab and radiology data.  Preventive health is updated, specifically  Cancer screening and Immunization.   Questions or concerns regarding consultations or procedures which the PT has had in the interim are  addressed. The PT denies any adverse reactions to current medications since the last visit.  C/o increased and worsened back pain with radiition, now ready for help with this, had a fall earlier this year which has aggravated the pain  Has excellent blood sugar log Denies polyuria, polydipsia, blurred vision , or hypoglycemic episodes. Unfortunately her blood sugar has deteriorated, will need to return with her meter   ROS Denies recent fever or chills. Denies sinus pressure, nasal congestion, ear pain or sore throat. Denies chest congestion, productive cough or wheezing. Denies chest pains, palpitations and leg swelling Denies abdominal pain, nausea, vomiting,diarrhea or constipation.   Denies dysuria, frequency, hesitancy or incontinence.  Denies headaches, seizures, numbness, or tingling. Denies uncontrolled  depression, anxiety or insomnia. Denies skin break down or rash.   PE  BP 132/84 mmHg  Pulse 76  Resp 18  Ht 5\' 5"  (1.651 m)  Wt 198 lb (89.812 kg)  BMI 32.95 kg/m2  SpO2 98%  Patient alert and oriented and in no cardiopulmonary distress.  HEENT: No facial asymmetry, EOMI,   oropharynx pink and moist.  Neck supple no JVD, no mass.  Chest: Clear to auscultation bilaterally.  CVS: S1, S2 no murmurs, no S3.Regular rate.  ABD: Soft non tender.   Ext: No edema  MS: decreased  ROM spine, adequate in  shoulders, hips and knees.  Skin: Intact, no  ulcerations or rash noted.  Psych: Good eye contact, normal affect. Memory intact not anxious or depressed appearing.  CNS: CN 2-12 intact, power,  normal throughout.no focal deficits noted.   Assessment & Plan   Diabetes mellitus, insulin dependent (IDDM), uncontrolled Deteriorated, despite very good readings which pt has documented, she is to return with her meter for nurse to check this, and to call with weekly results  Jennifer Blackburn is reminded of the importance of commitment to daily physical activity for 30 minutes or more, as able and the need to limit carbohydrate intake to 30 to 60 grams per meal to help with blood sugar control.   The need to take medication as prescribed, test blood sugar as directed, and to call between visits if there is a concern that blood sugar is uncontrolled is also discussed.   Jennifer Blackburn is reminded of the importance of daily foot exam, annual eye examination, and good blood sugar, blood pressure and cholesterol control.  Diabetic Labs Latest Ref Rng 07/10/2014 12/28/2013 07/30/2013 05/02/2013 05/01/2013  HbA1c <5.7 % 9.0(H) 9.2(H) 8.0(H) - 9.1(H)  Microalbumin <2.0 mg/dL 4.7(H) - - 2.37(H) -  Micro/Creat Ratio 0.0 - 30.0 mg/g 20.8 - - 10.4 -  Chol 0 - 200 mg/dL 182 204(H) - - 165  HDL >=46 mg/dL 44(L) 46 - - 43  Calc LDL 0 - 99 mg/dL 105(H) 132(H) - - 95  Triglycerides <150 mg/dL 167(H) 130 - - 133  Creatinine 0.50 - 1.10 mg/dL 0.73 0.71 0.60 - 0.62  BP/Weight 09/10/2014 08/20/2014 07/15/2014 05/03/2014 04/11/2014 04/02/2014 04/28/7410  Systolic BP 878 676 720 947 096 283 662  Diastolic BP 72 70 84 74 80 70 64  Wt. (Lbs) 200.4 198.12 198 197.7 198 198 194.2  BMI 33.35 32.97 32.95 33.42 32.95 32.95 32.32   Foot/eye exam completion dates Latest Ref Rng 07/15/2014 12/31/2013  Eye Exam No Retinopathy - -  Foot exam Order - - -  Foot Form Completion - Done Done          ESSENTIAL HYPERTENSION, BENIGN Controlled, no change in medication DASH diet and  commitment to daily physical activity for a minimum of 30 minutes discussed and encouraged, as a part of hypertension management. The importance of attaining a healthy weight is also discussed.  BP/Weight 09/10/2014 08/20/2014 07/15/2014 05/03/2014 04/11/2014 04/02/2014 94/09/6544  Systolic BP 503 546 568 127 517 001 749  Diastolic BP 72 70 84 74 80 70 64  Wt. (Lbs) 200.4 198.12 198 197.7 198 198 194.2  BMI 33.35 32.97 32.95 33.42 32.95 32.95 32.32        Insomnia due to stress Sleep hygiene reviewed and written information offered also. Prescription sent for  medication needed.   At high risk for falls Third fall in past 8 monts, home safety and fall precautions discussed  Hyperlipidemia LDL goal <70 Hyperlipidemia:Low fat diet discussed and encouraged.   Lipid Panel  Lab Results  Component Value Date   CHOL 182 07/10/2014   HDL 44* 07/10/2014   LDLCALC 105* 07/10/2014   TRIG 167* 07/10/2014   CHOLHDL 4.1 07/10/2014   Uncontrolled, no med change , rept in 3 month, dietary change discussed     Need for vaccination with 13-polyvalent pneumococcal conjugate vaccine After obtaining informed consent, the vaccine is  administered by LPN.   Anxiety and depression Continue current medication, not suicidal or homicidal Pt to continue with therapy which she finds very beneficial  Back pain with radiation Increased following recent fall, will likely need an epidural  Obesity (BMI 30.0-34.9) Deteriorated. Patient re-educated about  the importance of commitment to a  minimum of 150 minutes of exercise per week.  The importance of healthy food choices with portion control discussed. Encouraged to start a food diary, count calories and to consider  joining a support group. Sample diet sheets offered. Goals set by the patient for the next several months.   Weight /BMI 09/10/2014 08/20/2014 07/15/2014  WEIGHT 200 lb 6.4 oz 198 lb 1.9 oz 198 lb  HEIGHT 5\' 5"  5\' 5"  5\' 5"   BMI 33.35  kg/m2 32.97 kg/m2 32.95 kg/m2    Current exercise per week 100 minutes.        Review of Systems     Objective:   Physical Exam        Assessment & Plan:

## 2014-07-15 NOTE — Patient Instructions (Addendum)
F/u in 3 month, call if you need me before  PLEASE return to nurse with meter this week asap, so we can see what the problem is since you ar e getting good readings but blood sugar uncontrolled  Arrange to check WEEKLY with nurse with blood sugar readings, I will need to be increasing your insulin dose, since currently blood sugar too high, and also because you need and will get an epidural injection in the near future  Start 55 units if insulin as prescribed avery  Bedtime please  You are referred to diabetic educator, [employer] to help YOU, she will call, schedule appt with her  Goal for fasting blood sugar ranges from 90 to 130 and 2 hours after any meal or at bedtime should be between 140 to 180.  Pls cut back on cheese and egg yoolk, cholesterol and TG are too high  Hope you start feeling better soon, sorry about the fall in January  Prevnar today  Fasting lipid, cmp andn EGFR and HBA1C in 3  months.  Script for diabetic shoes will be sent to your pharmacy

## 2014-07-17 ENCOUNTER — Telehealth: Payer: Self-pay | Admitting: Family Medicine

## 2014-07-18 ENCOUNTER — Other Ambulatory Visit: Payer: Self-pay | Admitting: Family Medicine

## 2014-07-18 NOTE — Telephone Encounter (Signed)
This has already been entered in the computer

## 2014-07-29 ENCOUNTER — Other Ambulatory Visit: Payer: Self-pay | Admitting: Family Medicine

## 2014-08-20 ENCOUNTER — Encounter: Payer: Self-pay | Admitting: Family Medicine

## 2014-08-20 ENCOUNTER — Ambulatory Visit (INDEPENDENT_AMBULATORY_CARE_PROVIDER_SITE_OTHER): Payer: Medicare Other | Admitting: Family Medicine

## 2014-08-20 ENCOUNTER — Other Ambulatory Visit: Payer: Self-pay | Admitting: Family Medicine

## 2014-08-20 VITALS — BP 128/70 | HR 70 | Resp 16 | Ht 65.0 in | Wt 198.1 lb

## 2014-08-20 DIAGNOSIS — F418 Other specified anxiety disorders: Secondary | ICD-10-CM

## 2014-08-20 DIAGNOSIS — F5102 Adjustment insomnia: Secondary | ICD-10-CM | POA: Diagnosis not present

## 2014-08-20 DIAGNOSIS — Z794 Long term (current) use of insulin: Secondary | ICD-10-CM

## 2014-08-20 DIAGNOSIS — E559 Vitamin D deficiency, unspecified: Secondary | ICD-10-CM | POA: Diagnosis not present

## 2014-08-20 DIAGNOSIS — I2510 Atherosclerotic heart disease of native coronary artery without angina pectoris: Secondary | ICD-10-CM | POA: Diagnosis not present

## 2014-08-20 DIAGNOSIS — D539 Nutritional anemia, unspecified: Secondary | ICD-10-CM

## 2014-08-20 DIAGNOSIS — F419 Anxiety disorder, unspecified: Secondary | ICD-10-CM

## 2014-08-20 DIAGNOSIS — E1065 Type 1 diabetes mellitus with hyperglycemia: Secondary | ICD-10-CM

## 2014-08-20 DIAGNOSIS — I10 Essential (primary) hypertension: Secondary | ICD-10-CM

## 2014-08-20 DIAGNOSIS — E669 Obesity, unspecified: Secondary | ICD-10-CM

## 2014-08-20 DIAGNOSIS — E1165 Type 2 diabetes mellitus with hyperglycemia: Secondary | ICD-10-CM

## 2014-08-20 DIAGNOSIS — IMO0001 Reserved for inherently not codable concepts without codable children: Secondary | ICD-10-CM

## 2014-08-20 DIAGNOSIS — F329 Major depressive disorder, single episode, unspecified: Secondary | ICD-10-CM

## 2014-08-20 DIAGNOSIS — F32A Depression, unspecified: Secondary | ICD-10-CM

## 2014-08-20 LAB — IRON AND TIBC
%SAT: 16 % — ABNORMAL LOW (ref 20–55)
Iron: 56 ug/dL (ref 42–145)
TIBC: 345 ug/dL (ref 250–470)
UIBC: 289 ug/dL (ref 125–400)

## 2014-08-20 LAB — CBC WITH DIFFERENTIAL/PLATELET
Basophils Absolute: 0 10*3/uL (ref 0.0–0.1)
Basophils Relative: 0 % (ref 0–1)
Eosinophils Absolute: 0.3 10*3/uL (ref 0.0–0.7)
Eosinophils Relative: 5 % (ref 0–5)
HCT: 35.9 % — ABNORMAL LOW (ref 36.0–46.0)
Hemoglobin: 11.6 g/dL — ABNORMAL LOW (ref 12.0–15.0)
Lymphocytes Relative: 39 % (ref 12–46)
Lymphs Abs: 2.4 10*3/uL (ref 0.7–4.0)
MCH: 27.1 pg (ref 26.0–34.0)
MCHC: 32.3 g/dL (ref 30.0–36.0)
MCV: 83.9 fL (ref 78.0–100.0)
MPV: 9.7 fL (ref 8.6–12.4)
Monocytes Absolute: 0.4 10*3/uL (ref 0.1–1.0)
Monocytes Relative: 7 % (ref 3–12)
Neutro Abs: 3 10*3/uL (ref 1.7–7.7)
Neutrophils Relative %: 49 % (ref 43–77)
Platelets: 254 10*3/uL (ref 150–400)
RBC: 4.28 MIL/uL (ref 3.87–5.11)
RDW: 14.3 % (ref 11.5–15.5)
WBC: 6.1 10*3/uL (ref 4.0–10.5)

## 2014-08-20 LAB — FOLATE: Folate: 17.8 ng/mL

## 2014-08-20 LAB — VITAMIN B12: Vitamin B-12: 183 pg/mL — ABNORMAL LOW (ref 211–911)

## 2014-08-20 LAB — FERRITIN: Ferritin: 141 ng/mL (ref 10–291)

## 2014-08-20 MED ORDER — TEMAZEPAM 7.5 MG PO CAPS: 7.5000 mg | ORAL_CAPSULE | Freq: Every evening | ORAL | Status: DC | PRN

## 2014-08-20 MED ORDER — VITAMIN B-12 1000 MCG PO TABS: 1000.0000 ug | ORAL_TABLET | Freq: Every day | ORAL | Status: DC

## 2014-08-20 NOTE — Patient Instructions (Signed)
F/u in August, call if you need me before   New for sleep is restoril, call if dose is insufficient for increase after 2 weeks  Work on good sleep hygiene  CBc and anemia panel today, Vit D today  No infection in toe, arthritis  Please work on good  health habits so that your health will improve. 1. Commitment to daily physical activity for 30 to 60  minutes, if you are able to do this.  2. Commitment to wise food choices. Aim for half of your  food intake to be vegetable and fruit, one quarter starchy foods, and one quarter protein. Try to eat on a regular schedule  3 meals per day, snacking between meals should be limited to vegetables or fruits or small portions of nuts. 64 ounces of water per day is generally recommended, unless you have specific health conditions, like heart failure or kidney failure where you will need to limit fluid intake.  3. Commitment to sufficient and a  good quality of physical and mental rest daily, generally between 6 to 8 hours per day.  WITH PERSISTANCE AND PERSEVERANCE, THE IMPOSSIBLE , BECOMES THE NORM!   Thanks for choosing [facility], we consider it a privelige to serve you.

## 2014-08-20 NOTE — Progress Notes (Signed)
Subjective:    Patient ID: Jennifer Blackburn, female    DOB: Sep 02, 1945, 69 y.o.   MRN: [id number]  HPI    [place]     MRN: [id number]      DOB: [birth year]   HPI Jennifer Blackburn is here for follow up and re-evaluation of chronic medical conditions, medication management and review of any available recent lab and radiology data.  Preventive health is updated, specifically  Cancer screening and Immunization.   Questions or concerns regarding consultations or procedures which Jennifer Blackburn has had in Jennifer interim are  addressed. Jennifer Blackburn denies any adverse reactions to current medications since Jennifer last visit.  Main c/o insonmnia and chronic fatigue, also wosening depression not suicidal or homicidal C/o pain in 5th toe, no trauma  ROS Denies recent fever or chills. Denies sinus pressure, nasal congestion, ear pain or sore throat. Denies chest congestion, productive cough or wheezing. Denies chest pains, palpitations and leg swelling Denies abdominal pain, nausea, vomiting,diarrhea or constipation.   Denies dysuria, frequency, hesitancy or incontinence. C/o chronic  joint pain, swelling and limitation in mobility. Denies headaches, seizures, numbness, or tingling. C/o increased  depression, anxiety and  insomnia. Denies skin break down or rash.   PE  BP 128/70 mmHg  Pulse 70  Resp 16  Ht 5\' 5"  (1.651 m)  Wt 198 lb 1.9 oz (89.867 kg)  BMI 32.97 kg/m2  SpO2 97%  Patient alert and oriented and in no cardiopulmonary distress.  HEENT: No facial asymmetry, EOMI,   oropharynx pink and moist.  Neck supple no JVD, no mass.  Chest: Clear to auscultation bilaterally.  CVS: S1, S2 no murmurs, no S3.Regular rate.  ABD: Soft non tender.   Ext: No edema, 5th toe slightly deformed with tenderness , no eryhtema  MS: though reduced ROM spine, shoulders, hips and knees.  Skin: Intact, no ulcerations or rash noted.  Psych: Good eye contact, at times tearful affect . Memory intact both  anxious and  depressed appearing.  CNS: CN 2-12 intact, power,  normal throughout.no focal deficits noted.   Assessment & Plan  Insomnia due to stress Sleep hygiene reviewed and written information offered also. Prescription sent for  medication needed. Jennifer Blackburn to sched f/u with Jennifer Blackburn also  Anxiety and depression Increased anxiety and depression needs to see Blackburn, restoril added for sleep Not suicidal or homicidalp  FATIGUE increased due to poor sleep and anxiety  Both are being addressed and Jennifer Blackburn encouraged to commit to daily exercise  Obesity (BMI 30.0-34.9) Deteriorated. Patient re-educated about  Jennifer importance of commitment to a  minimum of 150 minutes of exercise per week.  Jennifer importance of healthy food choices with portion control discussed. Encouraged to start a food diary, count calories and to consider  joining a support group. Sample diet sheets offered. Goals set by Jennifer patient for Jennifer next several months.   Weight /BMI 09/10/2014 08/20/2014 07/15/2014  WEIGHT 200 lb 6.4 oz 198 lb 1.9 oz 198 lb  HEIGHT 5\' 5"  5\' 5"  5\' 5"   BMI 33.35 kg/m2 32.97 kg/m2 32.95 kg/m2    Current exercise per week 90 minutes.   Diabetes mellitus, insulin dependent (IDDM), uncontrolled Jennifer Blackburn is reminded of Jennifer importance of commitment to daily physical activity for 30 minutes or more, as able and Jennifer need to limit carbohydrate intake to 30 to 60 grams per meal to help with blood sugar control.   Jennifer need to take medication as prescribed, test blood  sugar as directed, and to call between visits if there is a concern that blood sugar is uncontrolled is also discussed.   Jennifer Blackburn is reminded of Jennifer importance of daily foot exam, annual eye examination, and good blood sugar, blood pressure and cholesterol control. Slightly improved but still very uncontrolled  Diabetic Labs Latest Ref Rng 07/10/2014 12/28/2013 07/30/2013 05/02/2013 05/01/2013  HbA1c <5.7 % 9.0(H) 9.2(H) 8.0(H) -  9.1(H)  Microalbumin <2.0 mg/dL 4.7(H) - - 2.37(H) -  Micro/Creat Ratio 0.0 - 30.0 mg/g 20.8 - - 10.4 -  Chol 0 - 200 mg/dL 182 204(H) - - 165  HDL >=46 mg/dL 44(L) 46 - - 43  Calc LDL 0 - 99 mg/dL 105(H) 132(H) - - 95  Triglycerides <150 mg/dL 167(H) 130 - - 133  Creatinine 0.50 - 1.10 mg/dL 0.73 0.71 0.60 - 0.62   BP/Weight 09/10/2014 08/20/2014 07/15/2014 05/03/2014 04/11/2014 04/02/2014 38/03/173  Systolic BP 102 585 277 824 235 361 443  Diastolic BP 72 70 84 74 80 70 64  Wt. (Lbs) 200.4 198.12 198 197.7 198 198 194.2  BMI 33.35 32.97 32.95 33.42 32.95 32.95 32.32   Foot/eye exam completion dates Latest Ref Rng 07/15/2014 12/31/2013  Eye Exam No Retinopathy - -  Foot exam Order - - -  Foot Form Completion - Done Done         ESSENTIAL HYPERTENSION, BENIGN Controlled, no change in medication DASH diet and commitment to daily physical activity for a minimum of 30 minutes discussed and encouraged, as a part of hypertension management. Jennifer importance of attaining a healthy weight is also discussed.  BP/Weight 09/10/2014 08/20/2014 07/15/2014 05/03/2014 04/11/2014 04/02/2014 15/06/84  Systolic BP 761 950 932 671 245 809 983  Diastolic BP 72 70 84 74 80 70 64  Wt. (Lbs) 200.4 198.12 198 197.7 198 198 194.2  BMI 33.35 32.97 32.95 33.42 32.95 32.95 32.32        Coronary atherosclerosis Asymptomatic , currently stable      Review of Systems     Objective:   Physical Exam        Assessment & Plan:

## 2014-08-21 LAB — VITAMIN D 25 HYDROXY (VIT D DEFICIENCY, FRACTURES): Vit D, 25-Hydroxy: 16 ng/mL — ABNORMAL LOW (ref 30–100)

## 2014-08-23 MED ORDER — VITAMIN D (ERGOCALCIFEROL) 1.25 MG (50000 UNIT) PO CAPS: 50000.0000 [IU] | ORAL_CAPSULE | ORAL | Status: DC

## 2014-08-29 DIAGNOSIS — N390 Urinary tract infection, site not specified: Secondary | ICD-10-CM | POA: Diagnosis not present

## 2014-08-29 DIAGNOSIS — N3000 Acute cystitis without hematuria: Secondary | ICD-10-CM | POA: Diagnosis not present

## 2014-09-10 ENCOUNTER — Ambulatory Visit (INDEPENDENT_AMBULATORY_CARE_PROVIDER_SITE_OTHER): Payer: Medicare Other | Admitting: Family Medicine

## 2014-09-10 ENCOUNTER — Encounter: Payer: Self-pay | Admitting: Family Medicine

## 2014-09-10 VITALS — BP 130/72 | HR 97 | Resp 16 | Ht 65.0 in | Wt 200.4 lb

## 2014-09-10 DIAGNOSIS — M549 Dorsalgia, unspecified: Secondary | ICD-10-CM

## 2014-09-10 DIAGNOSIS — I10 Essential (primary) hypertension: Secondary | ICD-10-CM | POA: Diagnosis not present

## 2014-09-10 DIAGNOSIS — I2510 Atherosclerotic heart disease of native coronary artery without angina pectoris: Secondary | ICD-10-CM | POA: Diagnosis not present

## 2014-09-10 MED ORDER — GABAPENTIN 100 MG PO CAPS: 100.0000 mg | ORAL_CAPSULE | Freq: Three times a day (TID) | ORAL | Status: DC

## 2014-09-10 NOTE — Patient Instructions (Signed)
F/u as before  New for leg pain due to nerve irritation in spine  New for management of pain due to nerve irritation is gabapentin 100mg .  Start one capsule at bedtime, after 3 to 5 days increase , if needed, to two capsules at bedtime, and after an additional 5 days to 3 capsules at bedtime if needed , for pain control.  Please note, the script is written as one three times daily, DO NOT take like this, instead take only at bedtime as above.  Fasting labs for next visit

## 2014-09-15 NOTE — Assessment & Plan Note (Signed)
Sleep hygiene reviewed and written information offered also. Prescription sent for  medication needed. Pt to sched f/u with her therapist also

## 2014-09-15 NOTE — Assessment & Plan Note (Signed)
Increased anxiety and depression needs to see therapist, restoril added for sleep Not suicidal or homicidalp

## 2014-09-15 NOTE — Assessment & Plan Note (Signed)
Controlled, no change in medication DASH diet and commitment to daily physical activity for a minimum of 30 minutes discussed and encouraged, as a part of hypertension management. The importance of attaining a healthy weight is also discussed.  BP/Weight 09/10/2014 08/20/2014 07/15/2014 05/03/2014 04/11/2014 04/02/2014 77/11/3901  Systolic BP 009 233 007 622 633 354 562  Diastolic BP 72 70 84 74 80 70 64  Wt. (Lbs) 200.4 198.12 198 197.7 198 198 194.2  BMI 33.35 32.97 32.95 33.42 32.95 32.95 32.32

## 2014-09-15 NOTE — Assessment & Plan Note (Signed)
increased due to poor sleep and anxiety  Both are being addressed and pt encouraged to commit to daily exercise

## 2014-09-15 NOTE — Assessment & Plan Note (Signed)
Deteriorated. Patient re-educated about  the importance of commitment to a  minimum of 150 minutes of exercise per week.  The importance of healthy food choices with portion control discussed. Encouraged to start a food diary, count calories and to consider  joining a support group. Sample diet sheets offered. Goals set by the patient for the next several months.   Weight /BMI 09/10/2014 08/20/2014 07/15/2014  WEIGHT 200 lb 6.4 oz 198 lb 1.9 oz 198 lb  HEIGHT 5\' 5"  5\' 5"  5\' 5"   BMI 33.35 kg/m2 32.97 kg/m2 32.95 kg/m2    Current exercise per week 90 minutes.

## 2014-09-15 NOTE — Assessment & Plan Note (Signed)
Increased and uncontrolled with burning in left lower extremity,  Known disc disease, start gabapentin at night, titrate up, as needed/ tolerated to 3 capsule States she will have epidural if no benefit

## 2014-09-15 NOTE — Assessment & Plan Note (Signed)
Asymptomatic , currently stable

## 2014-09-15 NOTE — Progress Notes (Signed)
   Jennifer Blackburn     MRN: [id number]      DOB: 1946-01-17   HPI Jennifer Blackburn is here with the primary c/o burning pain in left lower extremity, radiates from low back to the feet. No recent trauma, has established disc disease. Denies incontinence , or weakness , denies loss of sensation  ROS Denies recent fever or chills. Denies sinus pressure, nasal congestion, ear pain or sore throat. Denies chest congestion, productive cough or wheezing. Denies chest pains, palpitations and leg swelling Denies abdominal pain, nausea, vomiting,diarrhea or constipation.   Denies dysuria, frequency, hesitancy or incontinence.    PE  BP 130/72 mmHg  Pulse 97  Resp 16  Ht 5\' 5"  (1.651 m)  Wt 200 lb 6.4 oz (90.901 kg)  BMI 33.35 kg/m2  SpO2 99%  Patient alert and oriented and in no cardiopulmonary distress.  HEENT: No facial asymmetry, EOMI,   oropharynx pink and moist.  Neck supple no JVD, no mass.  Chest: Clear to auscultation bilaterally.  CVS: S1, S2 no murmurs, no S3.Regular rate.  ABD: Soft non tender.   Ext: No edema  MS: Adequate though reduced  ROM spine, shoulders, hips and knees.  Skin: Intact, no ulcerations or rash noted.  Psych: Good eye contact, normal affect. Memory intact not anxious or depressed appearing.  CNS: CN 2-12 intact, power,  normal throughout.no focal deficits noted.  Back pain with radiation Increased and uncontrolled with burning in left lower extremity,  Known disc disease, start gabapentin at night, titrate up, as needed/ tolerated to 3 capsule States she will have epidural if no benefit  ESSENTIAL HYPERTENSION, BENIGN Controlled, no change in medication DASH diet and commitment to daily physical activity for a minimum of 30 minutes discussed and encouraged, as a part of hypertension management. The importance of attaining a healthy weight is also discussed.  BP/Weight 09/10/2014 08/20/2014 07/15/2014 05/03/2014 04/11/2014 04/02/2014 24/0/9735  Systolic  BP 329 924 268 341 962 229 798  Diastolic BP 72 70 84 74 80 70 64  Wt. (Lbs) 200.4 198.12 198 197.7 198 198 194.2  BMI 33.35 32.97 32.95 33.42 32.95 32.95 32.32          Assessment & Plan

## 2014-09-15 NOTE — Assessment & Plan Note (Signed)
Controlled, no change in medication DASH diet and commitment to daily physical activity for a minimum of 30 minutes discussed and encouraged, as a part of hypertension management. The importance of attaining a healthy weight is also discussed.  BP/Weight 09/10/2014 08/20/2014 07/15/2014 05/03/2014 04/11/2014 04/02/2014 11/27/3530  Systolic BP 992 426 834 196 222 979 892  Diastolic BP 72 70 84 74 80 70 64  Wt. (Lbs) 200.4 198.12 198 197.7 198 198 194.2  BMI 33.35 32.97 32.95 33.42 32.95 32.95 32.32

## 2014-09-15 NOTE — Assessment & Plan Note (Addendum)
Jennifer Blackburn is reminded of the importance of commitment to daily physical activity for 30 minutes or more, as able and the need to limit carbohydrate intake to 30 to 60 grams per meal to help with blood sugar control.   The need to take medication as prescribed, test blood sugar as directed, and to call between visits if there is a concern that blood sugar is uncontrolled is also discussed.   Jennifer Blackburn is reminded of the importance of daily foot exam, annual eye examination, and good blood sugar, blood pressure and cholesterol control. Slightly improved but still very uncontrolled  Diabetic Labs Latest Ref Rng 07/10/2014 12/28/2013 07/30/2013 05/02/2013 05/01/2013  HbA1c <5.7 % 9.0(H) 9.2(H) 8.0(H) - 9.1(H)  Microalbumin <2.0 mg/dL 4.7(H) - - 2.37(H) -  Micro/Creat Ratio 0.0 - 30.0 mg/g 20.8 - - 10.4 -  Chol 0 - 200 mg/dL 182 204(H) - - 165  HDL >=46 mg/dL 44(L) 46 - - 43  Calc LDL 0 - 99 mg/dL 105(H) 132(H) - - 95  Triglycerides <150 mg/dL 167(H) 130 - - 133  Creatinine 0.50 - 1.10 mg/dL 0.73 0.71 0.60 - 0.62   BP/Weight 09/10/2014 08/20/2014 07/15/2014 05/03/2014 04/11/2014 04/02/2014 79/0/2409  Systolic BP 735 329 924 268 341 962 229  Diastolic BP 72 70 84 74 80 70 64  Wt. (Lbs) 200.4 198.12 198 197.7 198 198 194.2  BMI 33.35 32.97 32.95 33.42 32.95 32.95 32.32   Foot/eye exam completion dates Latest Ref Rng 07/15/2014 12/31/2013  Eye Exam No Retinopathy - -  Foot exam Order - - -  Foot Form Completion - Done Done

## 2014-10-13 DIAGNOSIS — Z23 Encounter for immunization: Secondary | ICD-10-CM | POA: Insufficient documentation

## 2014-10-13 NOTE — Assessment & Plan Note (Signed)
Controlled, no change in medication DASH diet and commitment to daily physical activity for a minimum of 30 minutes discussed and encouraged, as a part of hypertension management. The importance of attaining a healthy weight is also discussed.  BP/Weight 09/10/2014 08/20/2014 07/15/2014 05/03/2014 04/11/2014 04/02/2014 50/09/2255  Systolic BP 505 183 358 251 898 421 031  Diastolic BP 72 70 84 74 80 70 64  Wt. (Lbs) 200.4 198.12 198 197.7 198 198 194.2  BMI 33.35 32.97 32.95 33.42 32.95 32.95 32.32

## 2014-10-13 NOTE — Assessment & Plan Note (Signed)
Sleep hygiene reviewed and written information offered also. Prescription sent for  medication needed.  

## 2014-10-13 NOTE — Assessment & Plan Note (Signed)
Increased following recent fall, will likely need an epidural

## 2014-10-13 NOTE — Assessment & Plan Note (Signed)
Deteriorated. Patient re-educated about  the importance of commitment to a  minimum of 150 minutes of exercise per week.  The importance of healthy food choices with portion control discussed. Encouraged to start a food diary, count calories and to consider  joining a support group. Sample diet sheets offered. Goals set by the patient for the next several months.   Weight /BMI 09/10/2014 08/20/2014 07/15/2014  WEIGHT 200 lb 6.4 oz 198 lb 1.9 oz 198 lb  HEIGHT 5\' 5"  5\' 5"  5\' 5"   BMI 33.35 kg/m2 32.97 kg/m2 32.95 kg/m2    Current exercise per week 100 minutes.

## 2014-10-13 NOTE — Assessment & Plan Note (Signed)
Deteriorated, despite very good readings which pt has documented, she is to return with her meter for nurse to check this, and to call with weekly results  Jennifer Blackburn is reminded of the importance of commitment to daily physical activity for 30 minutes or more, as able and the need to limit carbohydrate intake to 30 to 60 grams per meal to help with blood sugar control.   The need to take medication as prescribed, test blood sugar as directed, and to call between visits if there is a concern that blood sugar is uncontrolled is also discussed.   Jennifer Blackburn is reminded of the importance of daily foot exam, annual eye examination, and good blood sugar, blood pressure and cholesterol control.  Diabetic Labs Latest Ref Rng 07/10/2014 12/28/2013 07/30/2013 05/02/2013 05/01/2013  HbA1c <5.7 % 9.0(H) 9.2(H) 8.0(H) - 9.1(H)  Microalbumin <2.0 mg/dL 4.7(H) - - 2.37(H) -  Micro/Creat Ratio 0.0 - 30.0 mg/g 20.8 - - 10.4 -  Chol 0 - 200 mg/dL 182 204(H) - - 165  HDL >=46 mg/dL 44(L) 46 - - 43  Calc LDL 0 - 99 mg/dL 105(H) 132(H) - - 95  Triglycerides <150 mg/dL 167(H) 130 - - 133  Creatinine 0.50 - 1.10 mg/dL 0.73 0.71 0.60 - 0.62   BP/Weight 09/10/2014 08/20/2014 07/15/2014 05/03/2014 04/11/2014 04/02/2014 74/04/5954  Systolic BP 387 564 332 951 884 166 063  Diastolic BP 72 70 84 74 80 70 64  Wt. (Lbs) 200.4 198.12 198 197.7 198 198 194.2  BMI 33.35 32.97 32.95 33.42 32.95 32.95 32.32   Foot/eye exam completion dates Latest Ref Rng 07/15/2014 12/31/2013  Eye Exam No Retinopathy - -  Foot exam Order - - -  Foot Form Completion - Done Done

## 2014-10-13 NOTE — Assessment & Plan Note (Signed)
Third fall in past 8 monts, home safety and fall precautions discussed

## 2014-10-13 NOTE — Assessment & Plan Note (Signed)
Continue current medication, not suicidal or homicidal Pt to continue with therapy which she finds very beneficial

## 2014-10-13 NOTE — Assessment & Plan Note (Signed)
Hyperlipidemia:Low fat diet discussed and encouraged.   Lipid Panel  Lab Results  Component Value Date   CHOL 182 07/10/2014   HDL 44* 07/10/2014   LDLCALC 105* 07/10/2014   TRIG 167* 07/10/2014   CHOLHDL 4.1 07/10/2014   Uncontrolled, no med change , rept in 3 month, dietary change discussed

## 2014-10-13 NOTE — Assessment & Plan Note (Signed)
After obtaining informed consent, the vaccine is  administered by LPN.  

## 2014-10-23 DIAGNOSIS — H52223 Regular astigmatism, bilateral: Secondary | ICD-10-CM | POA: Diagnosis not present

## 2014-10-23 DIAGNOSIS — H5211 Myopia, right eye: Secondary | ICD-10-CM | POA: Diagnosis not present

## 2014-10-23 DIAGNOSIS — H5202 Hypermetropia, left eye: Secondary | ICD-10-CM | POA: Diagnosis not present

## 2014-10-23 DIAGNOSIS — H35341 Macular cyst, hole, or pseudohole, right eye: Secondary | ICD-10-CM | POA: Diagnosis not present

## 2014-10-23 LAB — HM DIABETES EYE EXAM

## 2014-10-24 ENCOUNTER — Ambulatory Visit: Payer: Self-pay | Admitting: Family Medicine

## 2014-11-14 DIAGNOSIS — E785 Hyperlipidemia, unspecified: Secondary | ICD-10-CM | POA: Diagnosis not present

## 2014-11-14 DIAGNOSIS — E1065 Type 1 diabetes mellitus with hyperglycemia: Secondary | ICD-10-CM | POA: Diagnosis not present

## 2014-11-14 DIAGNOSIS — E109 Type 1 diabetes mellitus without complications: Secondary | ICD-10-CM | POA: Diagnosis not present

## 2014-11-14 LAB — HEMOGLOBIN A1C
Hgb A1c MFr Bld: 9 % — ABNORMAL HIGH (ref <5.7)
Mean Plasma Glucose: 212 mg/dL — ABNORMAL HIGH (ref <117)

## 2014-11-15 LAB — COMPLETE METABOLIC PANEL WITH GFR
ALT: 21 U/L (ref 6–29)
AST: 22 U/L (ref 10–35)
Albumin: 4.1 g/dL (ref 3.6–5.1)
Alkaline Phosphatase: 36 U/L (ref 33–130)
BUN: 9 mg/dL (ref 7–25)
CO2: 31 mmol/L (ref 20–31)
Calcium: 8.9 mg/dL (ref 8.6–10.4)
Chloride: 100 mmol/L (ref 98–110)
Creat: 0.73 mg/dL (ref 0.50–0.99)
GFR, Est African American: >89 mL/min (ref >=60)
GFR, Est Non African American: 84 mL/min (ref >=60)
Glucose, Bld: 172 mg/dL — ABNORMAL HIGH (ref 65–99)
Potassium: 4.1 mmol/L (ref 3.5–5.3)
Sodium: 141 mmol/L (ref 135–146)
Total Bilirubin: 0.3 mg/dL (ref 0.2–1.2)
Total Protein: 7.2 g/dL (ref 6.1–8.1)

## 2014-11-15 LAB — LIPID PANEL
Cholesterol: 175 mg/dL (ref 125–200)
HDL: 42 mg/dL — ABNORMAL LOW (ref >=46)
LDL Cholesterol: 112 mg/dL (ref <130)
Total CHOL/HDL Ratio: 4.2 Ratio (ref <=5.0)
Triglycerides: 106 mg/dL (ref <150)
VLDL: 21 mg/dL (ref <30)

## 2014-11-19 ENCOUNTER — Ambulatory Visit (INDEPENDENT_AMBULATORY_CARE_PROVIDER_SITE_OTHER): Payer: Medicare Other | Admitting: Family Medicine

## 2014-11-19 ENCOUNTER — Encounter: Payer: Self-pay | Admitting: Family Medicine

## 2014-11-19 VITALS — BP 134/70 | HR 78 | Resp 18 | Ht 65.0 in | Wt 198.0 lb

## 2014-11-19 DIAGNOSIS — I2510 Atherosclerotic heart disease of native coronary artery without angina pectoris: Secondary | ICD-10-CM

## 2014-11-19 DIAGNOSIS — I10 Essential (primary) hypertension: Secondary | ICD-10-CM

## 2014-11-19 DIAGNOSIS — IMO0001 Reserved for inherently not codable concepts without codable children: Secondary | ICD-10-CM

## 2014-11-19 DIAGNOSIS — E1165 Type 2 diabetes mellitus with hyperglycemia: Secondary | ICD-10-CM

## 2014-11-19 DIAGNOSIS — E559 Vitamin D deficiency, unspecified: Secondary | ICD-10-CM

## 2014-11-19 DIAGNOSIS — E1065 Type 1 diabetes mellitus with hyperglycemia: Secondary | ICD-10-CM

## 2014-11-19 DIAGNOSIS — E119 Type 2 diabetes mellitus without complications: Secondary | ICD-10-CM

## 2014-11-19 DIAGNOSIS — E669 Obesity, unspecified: Secondary | ICD-10-CM

## 2014-11-19 DIAGNOSIS — F329 Major depressive disorder, single episode, unspecified: Secondary | ICD-10-CM

## 2014-11-19 DIAGNOSIS — E785 Hyperlipidemia, unspecified: Secondary | ICD-10-CM

## 2014-11-19 DIAGNOSIS — F32A Depression, unspecified: Secondary | ICD-10-CM

## 2014-11-19 DIAGNOSIS — E1169 Type 2 diabetes mellitus with other specified complication: Secondary | ICD-10-CM

## 2014-11-19 DIAGNOSIS — Z794 Long term (current) use of insulin: Secondary | ICD-10-CM

## 2014-11-19 DIAGNOSIS — F419 Anxiety disorder, unspecified: Secondary | ICD-10-CM

## 2014-11-19 DIAGNOSIS — F418 Other specified anxiety disorders: Secondary | ICD-10-CM

## 2014-11-19 NOTE — Patient Instructions (Signed)
F/u in mid Jan. Call if you need me before  Pls come for flu vaccine as discussed    You are referred to Dr Dorris Fetch re blood sugar  It is important that you exercise regularly at least 30 minutes 5 times a week. If you develop chest pain, have severe difficulty breathing, or feel very tired, stop exercising immediately and seek medical attention   Stopping commitments which you do not feel capable of / interested in is the better way to go   Fasting lipid, cmp and EGFR, Vit D, CBC  Please work on good  health habits so that your health will improve. 1. Commitment to daily physical activity for 30 to 60  minutes, if you are able to do this.  2. Commitment to wise food choices. Aim for half of your  food intake to be vegetable and fruit, one quarter starchy foods, and one quarter protein. Try to eat on a regular schedule  3 meals per day, snacking between meals should be limited to vegetables or fruits or small portions of nuts. 64 ounces of water per day is generally recommended, unless you have specific health conditions, like heart failure or kidney failure where you will need to limit fluid intake.  3. Commitment to sufficient and a  good quality of physical and mental rest daily, generally between 6 to 8 hours per day.  WITH PERSISTANCE AND PERSEVERANCE, THE IMPOSSIBLE , BECOMES THE NORM!   Thanks for choosing [facility], we consider it a privelige to serve you.

## 2014-12-11 ENCOUNTER — Telehealth: Payer: Self-pay | Admitting: Family Medicine

## 2014-12-11 MED ORDER — INSULIN PEN NEEDLE 31G X 8 MM MISC: Status: DC

## 2014-12-11 NOTE — Telephone Encounter (Signed)
Patient is calling asking for an order be placed for her Needles for Lantus Injections, please advise?

## 2014-12-11 NOTE — Telephone Encounter (Signed)
2 refills sent and advised further needles were to come from Dr Dorris Fetch

## 2014-12-15 ENCOUNTER — Other Ambulatory Visit: Payer: Self-pay | Admitting: Cardiovascular Disease

## 2014-12-16 NOTE — Telephone Encounter (Signed)
Rx request sent to pharmacy.  

## 2014-12-22 NOTE — Progress Notes (Signed)
Jennifer Blackburn     MRN: [id number]      DOB: Dec 28, 1945   HPI Jennifer Blackburn is here for follow up and re-evaluation of chronic medical conditions, medication management and review of any available recent lab and radiology data.  Preventive health is updated, specifically  Cancer screening and Immunization.   Questions or concerns regarding consultations or procedures which the PT has had in the interim are  Addressed. Denies polyuria, polydipsia, blurred vision , or hypoglycemic episodes.  The PT denies any adverse reactions to current medications since the last visit.  There are no new concerns.  There are no specific complaints   ROS Denies recent fever or chills. Denies sinus pressure, nasal congestion, ear pain or sore throat. Denies chest congestion, productive cough or wheezing. Denies chest pains, palpitations and leg swelling Denies abdominal pain, nausea, vomiting,diarrhea or constipation.   Denies dysuria, frequency, hesitancy or incontinence. Denies joint pain, swelling and limitation in mobility. Denies headaches, seizures, numbness, or tingling. Denies uncontrolled depression, anxiety or insomnia. Denies skin break down or rash.   PE  BP 134/70 mmHg  Pulse 78  Resp 18  Ht 5\' 5"  (1.651 m)  Wt 198 lb (89.812 kg)  BMI 32.95 kg/m2  SpO2 94%  Patient alert and oriented and in no cardiopulmonary distress.  HEENT: No facial asymmetry, EOMI,   oropharynx pink and moist.  Neck supple no JVD, no mass.  Chest: Clear to auscultation bilaterally.  CVS: S1, S2 no murmurs, no S3.Regular rate.  ABD: Soft non tender.   Ext: No edema  MS: Adequate though reduced ROM spine, shoulders, hips and knees.  Skin: Intact, no ulcerations or rash noted.  Psych: Good eye contact, normal affect. Memory intact not anxious or depressed appearing.  CNS: CN 2-12 intact, power,  normal throughout.no focal deficits noted.   Assessment & Plan   Diabetes mellitus, insulin  dependent (IDDM), uncontrolled Uncontrolled, needs to be treated by endo, now she agrees, and referral is entered Jennifer Blackburn is reminded of the importance of commitment to daily physical activity for 30 minutes or more, as able and the need to limit carbohydrate intake to 30 to 60 grams per meal to help with blood sugar control.   The need to take medication as prescribed, test blood sugar as directed, and to call between visits if there is a concern that blood sugar is uncontrolled is also discussed.   Jennifer Blackburn is reminded of the importance of daily foot exam, annual eye examination, and good blood sugar, blood pressure and cholesterol control.  Diabetic Labs Latest Ref Rng 11/14/2014 07/10/2014 12/28/2013 07/30/2013 05/02/2013  HbA1c <5.7 % 9.0(H) 9.0(H) 9.2(H) 8.0(H) -  Microalbumin <2.0 mg/dL - 4.7(H) - - 2.37(H)  Micro/Creat Ratio 0.0 - 30.0 mg/g - 20.8 - - 10.4  Chol 125 - 200 mg/dL 175 182 204(H) - -  HDL >=46 mg/dL 42(L) 44(L) 46 - -  Calc LDL <130 mg/dL 112 105(H) 132(H) - -  Triglycerides <150 mg/dL 106 167(H) 130 - -  Creatinine 0.50 - 0.99 mg/dL 0.73 0.73 0.71 0.60 -   BP/Weight 11/19/2014 09/10/2014 08/20/2014 07/15/2014 05/03/2014 04/11/2014 07/28/3265  Systolic BP 124 580 998 338 250 539 767  Diastolic BP 70 72 70 84 74 80 70  Wt. (Lbs) 198 200.4 198.12 198 197.7 198 198  BMI 32.95 33.35 32.97 32.95 33.42 32.95 32.95   Foot/eye exam completion dates Latest Ref Rng 10/23/2014 07/15/2014  Eye Exam No Retinopathy No Retinopathy -  Foot exam Order - - -  Foot Form Completion - - Done          ESSENTIAL HYPERTENSION, BENIGN Controlled, no change in medication DASH diet and commitment to daily physical activity for a minimum of 30 minutes discussed and encouraged, as a part of hypertension management. The importance of attaining a healthy weight is also discussed.  BP/Weight 11/19/2014 09/10/2014 08/20/2014 07/15/2014 05/03/2014 04/11/2014 4/85/4627  Systolic BP 035 009 381 829 126  937 169  Diastolic BP 70 72 70 84 74 80 70  Wt. (Lbs) 198 200.4 198.12 198 197.7 198 198  BMI 32.95 33.35 32.97 32.95 33.42 32.95 32.95        Hyperlipidemia LDL goal <70 Hyperlipidemia:Low fat diet discussed and encouraged.   Lipid Panel  Lab Results  Component Value Date   CHOL 175 11/14/2014   HDL 42* 11/14/2014   LDLCALC 112 11/14/2014   TRIG 106 11/14/2014   CHOLHDL 4.2 11/14/2014  encouraged tomincrease physical activity to improve HDL      Obesity (BMI 30.0-34.9) Deteriorated. Patient re-educated about  the importance of commitment to a  minimum of 150 minutes of exercise per week.  The importance of healthy food choices with portion control discussed. Encouraged to start a food diary, count calories and to consider  joining a support group. Sample diet sheets offered. Goals set by the patient for the next several months.   Weight /BMI 11/19/2014 09/10/2014 08/20/2014  WEIGHT 198 lb 200 lb 6.4 oz 198 lb 1.9 oz  HEIGHT 5\' 5"  5\' 5"  5\' 5"   BMI 32.95 kg/m2 33.35 kg/m2 32.97 kg/m2    Current exercise per week 90 minutes.   Anxiety and depression Controlled, no change in medication

## 2014-12-22 NOTE — Assessment & Plan Note (Signed)
Uncontrolled, needs to be treated by endo, now she agrees, and referral is entered Jennifer Blackburn is reminded of the importance of commitment to daily physical activity for 30 minutes or more, as able and the need to limit carbohydrate intake to 30 to 60 grams per meal to help with blood sugar control.   The need to take medication as prescribed, test blood sugar as directed, and to call between visits if there is a concern that blood sugar is uncontrolled is also discussed.   Jennifer Blackburn is reminded of the importance of daily foot exam, annual eye examination, and good blood sugar, blood pressure and cholesterol control.  Diabetic Labs Latest Ref Rng 11/14/2014 07/10/2014 12/28/2013 07/30/2013 05/02/2013  HbA1c <5.7 % 9.0(H) 9.0(H) 9.2(H) 8.0(H) -  Microalbumin <2.0 mg/dL - 4.7(H) - - 2.37(H)  Micro/Creat Ratio 0.0 - 30.0 mg/g - 20.8 - - 10.4  Chol 125 - 200 mg/dL 175 182 204(H) - -  HDL >=46 mg/dL 42(L) 44(L) 46 - -  Calc LDL <130 mg/dL 112 105(H) 132(H) - -  Triglycerides <150 mg/dL 106 167(H) 130 - -  Creatinine 0.50 - 0.99 mg/dL 0.73 0.73 0.71 0.60 -   BP/Weight 11/19/2014 09/10/2014 08/20/2014 07/15/2014 05/03/2014 04/11/2014 6/86/1683  Systolic BP 729 021 115 520 802 233 612  Diastolic BP 70 72 70 84 74 80 70  Wt. (Lbs) 198 200.4 198.12 198 197.7 198 198  BMI 32.95 33.35 32.97 32.95 33.42 32.95 32.95   Foot/eye exam completion dates Latest Ref Rng 10/23/2014 07/15/2014  Eye Exam No Retinopathy No Retinopathy -  Foot exam Order - - -  Foot Form Completion - - Done

## 2014-12-22 NOTE — Assessment & Plan Note (Signed)
Controlled, no change in medication  

## 2014-12-22 NOTE — Assessment & Plan Note (Signed)
Controlled, no change in medication DASH diet and commitment to daily physical activity for a minimum of 30 minutes discussed and encouraged, as a part of hypertension management. The importance of attaining a healthy weight is also discussed.  BP/Weight 11/19/2014 09/10/2014 08/20/2014 07/15/2014 05/03/2014 04/11/2014 07/04/4358  Systolic BP 165 800 634 949 447 395 844  Diastolic BP 70 72 70 84 74 80 70  Wt. (Lbs) 198 200.4 198.12 198 197.7 198 198  BMI 32.95 33.35 32.97 32.95 33.42 32.95 32.95

## 2014-12-22 NOTE — Assessment & Plan Note (Signed)
Hyperlipidemia:Low fat diet discussed and encouraged.   Lipid Panel  Lab Results  Component Value Date   CHOL 175 11/14/2014   HDL 42* 11/14/2014   LDLCALC 112 11/14/2014   TRIG 106 11/14/2014   CHOLHDL 4.2 11/14/2014  encouraged tomincrease physical activity to improve HDL

## 2014-12-22 NOTE — Assessment & Plan Note (Signed)
Deteriorated. Patient re-educated about  the importance of commitment to a  minimum of 150 minutes of exercise per week.  The importance of healthy food choices with portion control discussed. Encouraged to start a food diary, count calories and to consider  joining a support group. Sample diet sheets offered. Goals set by the patient for the next several months.   Weight /BMI 11/19/2014 09/10/2014 08/20/2014  WEIGHT 198 lb 200 lb 6.4 oz 198 lb 1.9 oz  HEIGHT 5\' 5"  5\' 5"  5\' 5"   BMI 32.95 kg/m2 33.35 kg/m2 32.97 kg/m2    Current exercise per week 90 minutes.

## 2014-12-23 ENCOUNTER — Other Ambulatory Visit: Payer: Self-pay | Admitting: Family Medicine

## 2014-12-28 ENCOUNTER — Other Ambulatory Visit: Payer: Self-pay | Admitting: Family Medicine

## 2015-01-13 ENCOUNTER — Telehealth: Payer: Self-pay | Admitting: *Deleted

## 2015-01-13 ENCOUNTER — Ambulatory Visit (INDEPENDENT_AMBULATORY_CARE_PROVIDER_SITE_OTHER): Payer: Medicare Other | Admitting: "Endocrinology

## 2015-01-13 ENCOUNTER — Encounter: Payer: Self-pay | Admitting: "Endocrinology

## 2015-01-13 VITALS — BP 120/84 | HR 74 | Ht 65.0 in | Wt 195.0 lb

## 2015-01-13 DIAGNOSIS — I10 Essential (primary) hypertension: Secondary | ICD-10-CM

## 2015-01-13 DIAGNOSIS — E038 Other specified hypothyroidism: Secondary | ICD-10-CM

## 2015-01-13 DIAGNOSIS — I2510 Atherosclerotic heart disease of native coronary artery without angina pectoris: Secondary | ICD-10-CM

## 2015-01-13 DIAGNOSIS — E669 Obesity, unspecified: Secondary | ICD-10-CM | POA: Diagnosis not present

## 2015-01-13 DIAGNOSIS — E1159 Type 2 diabetes mellitus with other circulatory complications: Secondary | ICD-10-CM

## 2015-01-13 DIAGNOSIS — E1169 Type 2 diabetes mellitus with other specified complication: Secondary | ICD-10-CM | POA: Insufficient documentation

## 2015-01-13 NOTE — Telephone Encounter (Signed)
Patient called lmom stating someone called her about falls, patient states it was a recording. Please advise [phone]

## 2015-01-13 NOTE — Patient Instructions (Signed)

## 2015-01-13 NOTE — Telephone Encounter (Signed)
Left message that the voicemail didn't come from our office and if she had a number I could call it back to find out where it come from  And if she had any concerns to call me back

## 2015-01-13 NOTE — Progress Notes (Signed)
Subjective:    Patient ID: Jennifer Blackburn, female    DOB: [birth year],    Past Medical History  Diagnosis Date  . DJD (degenerative joint disease) of lumbar spine   . CAD (coronary artery disease)   . Diabetes mellitus type II     without complication  . Hypertension     benign   . Anxiety   . Thyroid cancer ([place]) 2001  . Hyperlipidemia   . Hypercholesteremia   . Depression    Past Surgical History  Procedure Laterality Date  . Vesicovaginal fistula closure w/ tah    . Thyroidectomy    . Knee surgery Right     arthroscopy  . Tonsillectomy    . Left hip replaced  07/01/2010  . Spine surgery  2006    cervical  . Abdominal hysterectomy    . Joint replacement  07/01/2010    left hip  . Cataract extraction w/phaco Right 07/17/2012    Procedure: CATARACT EXTRACTION PHACO AND INTRAOCULAR LENS PLACEMENT (IOC);  Surgeon: Tonny Branch, MD;  Location: [facility];  Service: Ophthalmology;  Laterality: Right;  CDE:25.51  . Colonoscopy    . Colonoscopy N/A 01/15/2014    Procedure: COLONOSCOPY;  Surgeon: Daneil Dolin, MD;  Location: [facility];  Service: Endoscopy;  Laterality: N/A;  9:00 AM  . Doppler echocardiography    . Stress dipyridamole myocardial perfusion    . Cardiac catheterization     Social History   Social History  . Marital Status: Married    Spouse Name: N/A  . Number of Children: N/A  . Years of Education: N/A   Occupational History  . retired     Social History Main Topics  . Smoking status: Passive Smoke Exposure - Never Smoker  . Smokeless tobacco: Never Used  . Alcohol Use: No  . Drug Use: No  . Sexual Activity: Not Asked   Other Topics Concern  . None   Social History Narrative   Outpatient Encounter Prescriptions as of 01/13/2015  Medication Sig  . CINNAMON PO Take by mouth.  . gabapentin (NEURONTIN) 100 MG capsule Take 1 capsule (100 mg total) by mouth 3 (three) times daily.  Marland Kitchen glucose blood (ACCU-CHEK AVIVA PLUS) test strip Use as  instructed three times daily dx 250.01  . imipramine (TOFRANIL) 25 MG tablet TAKE 4 TABLETS BY MOUTH EVERY NIGHT AT BEDTIME  . Insulin Glargine (LANTUS) 100 UNIT/ML Solostar Pen Inject 55 Units into the skin daily at 10 pm. (Patient taking differently: Inject 50 Units into the skin daily at 10 pm. )  . Insulin Pen Needle (B-D ULTRAFINE III SHORT PEN) 31G X 8 MM MISC Use to inject insulin daily  . Lancets (ACCU-CHEK MULTICLIX) lancets Use as instructed three times daily dx 250.01  . levothyroxine (SYNTHROID) 200 MCG tablet Take 1 tablet (200 mcg total) by mouth daily before breakfast.  . Menthol-Methyl Salicylate (MUSCLE RUB) 10-15 % CREA Apply 1 application topically as needed for muscle pain.  . metFORMIN (GLUCOPHAGE) 1000 MG tablet TAKE 1 TABLET BY MOUTH TWICE DAILY  . metoprolol (LOPRESSOR) 50 MG tablet TAKE 1 TABLET BY MOUTH TWICE DAILY  . PARoxetine (PAXIL) 20 MG tablet Take 1 tablet (20 mg total) by mouth daily.  . pravastatin (PRAVACHOL) 80 MG tablet TAKE 1 TABLET BY MOUTH EVERY DAY  . quinapril (ACCUPRIL) 10 MG tablet TAKE 1 TABLET BY MOUTH DAILY  . temazepam (RESTORIL) 7.5 MG capsule Take 1 capsule (7.5 mg total) by mouth  at bedtime as needed for sleep.  Marland Kitchen tretinoin (RETIN-A) 0.05 % cream Apply 1 application topically every other day.  . triamcinolone (NASACORT) 55 MCG/ACT AERO nasal inhaler Place 2 sprays into the nose daily.  . verapamil (CALAN-SR) 180 MG CR tablet Take 180 mg by mouth at bedtime.  . verapamil (VERELAN PM) 180 MG 24 hr capsule TAKE 1 CAPSULE BY MOUTH EVERY DAY  . vitamin B-12 (CYANOCOBALAMIN) 1000 MCG tablet Take 1 tablet (1,000 mcg total) by mouth daily.  . Vitamin D, Ergocalciferol, (DRISDOL) 50000 UNITS CAPS capsule Take 1 capsule (50,000 Units total) by mouth every 7 (seven) days.  . [DISCONTINUED] glipiZIDE (GLUCOTROL) 10 MG tablet TAKE 1 TABLET BY MOUTH TWICE DAILY   No facility-administered encounter medications on file as of 01/13/2015.    ALLERGIES: Allergies  Allergen Reactions  . Nsaids Hives  . Sulfonamide Derivatives Hives   VACCINATION STATUS: Immunization History  Administered Date(s) Administered  . Influenza Split 03/10/2012  . Influenza Whole 01/04/2007  . Influenza,inj,Quad PF,36+ Mos 11/28/2012, 12/31/2013  . Pneumococcal Conjugate-13 07/15/2014  . Pneumococcal Polysaccharide-23 02/17/2004, 09/07/2012  . Tdap 12/21/2010    Diabetes She presents for her initial diabetic visit. She has type 2 diabetes mellitus. Onset time: She was diagnosed at approximate age of 33 years. Her disease course has been worsening. There are no hypoglycemic associated symptoms. Pertinent negatives for hypoglycemia include no confusion, headaches, pallor or seizures. Associated symptoms include polydipsia and polyuria. Pertinent negatives for diabetes include no chest pain, no fatigue and no polyphagia. There are no hypoglycemic complications. Symptoms are worsening. Diabetic complications include heart disease. Risk factors for coronary artery disease include dyslipidemia, diabetes mellitus and hypertension. Current diabetic treatment includes insulin injections and oral agent (dual therapy). She is compliant with treatment most of the time. She is following a generally unhealthy diet. She rarely participates in exercise. Home blood sugar record trend: She did not bring her logs nor glucose meter to review. An ACE inhibitor/angiotensin II receptor blocker is being taken. Eye exam is current.  Hyperlipidemia This is a chronic problem. The current episode started more than 1 year ago. The problem is controlled. Exacerbating diseases include hypothyroidism. Pertinent negatives include no chest pain, myalgias or shortness of breath. Current antihyperlipidemic treatment includes statins.  Hypertension This is a chronic problem. The current episode started more than 1 year ago. Pertinent negatives include no chest pain, headaches, palpitations  or shortness of breath. Risk factors for coronary artery disease include dyslipidemia, diabetes mellitus, obesity and sedentary lifestyle. Hypertensive end-organ damage includes a thyroid problem.  Thyroid Problem Presents for initial visit. Onset time: She underwent thyroidectomy for reported history of thyroid cancer following with Dr. Francoise Schaumann in [place]. She was to transfer her care to [facility]. She is willing to sign a medical release form for [place] to obtain her records. Patient reports no cold intolerance, diarrhea, fatigue, heat intolerance or palpitations. Treatments tried: she is currently on Synthroid 180 g by mouth every morning. Her past medical history is significant for hyperlipidemia.       Review of Systems  Constitutional: Negative for fatigue and unexpected weight change.  HENT: Negative for trouble swallowing and voice change.   Eyes: Negative for visual disturbance.  Respiratory: Negative for cough, shortness of breath and wheezing.   Cardiovascular: Negative for chest pain, palpitations and leg swelling.  Gastrointestinal: Negative for nausea, vomiting and diarrhea.  Endocrine: Positive for polydipsia and polyuria. Negative for cold intolerance, heat intolerance and polyphagia.  Musculoskeletal: Negative for myalgias  and arthralgias.  Skin: Negative for color change, pallor, rash and wound.  Neurological: Negative for seizures and headaches.  Psychiatric/Behavioral: Negative for suicidal ideas and confusion.    Objective:    BP 120/84 mmHg  Pulse 74  Ht 5\' 5"  (1.651 m)  Wt 195 lb (88.451 kg)  BMI 32.45 kg/m2  SpO2 99%  Wt Readings from Last 3 Encounters:  01/13/15 195 lb (88.451 kg)  11/19/14 198 lb (89.812 kg)  09/10/14 200 lb 6.4 oz (90.901 kg)    Physical Exam  Constitutional: She is oriented to person, place, and time. She appears well-developed.  HENT:  Head: Normocephalic and atraumatic.  Eyes: EOM are normal.  Neck: Normal range of motion. Neck  supple. No tracheal deviation present. No thyromegaly present.  Anterior lower neck scar from prior thyroidectomy for thyroid cancer.  Cardiovascular: Normal rate and regular rhythm.   Pulmonary/Chest: Effort normal and breath sounds normal.  Abdominal: Soft. Bowel sounds are normal. There is no tenderness. There is no guarding.  Musculoskeletal: Normal range of motion. She exhibits no edema.  Neurological: She is alert and oriented to person, place, and time. She has normal reflexes. No cranial nerve deficit. Coordination normal.  Skin: Skin is warm and dry. No rash noted. No erythema. No pallor.  Psychiatric: She has a normal mood and affect. Judgment normal.    Results for orders placed or performed in visit on 10/25/14  [facility] DIABETES EYE EXAM  Result Value Ref Range   HM Diabetic Eye Exam No Retinopathy No Retinopathy   Complete Blood Count (Most recent): Lab Results  Component Value Date   WBC 6.1 08/20/2014   HGB 11.6* 08/20/2014   HCT 35.9* 08/20/2014   MCV 83.9 08/20/2014   PLT 254 08/20/2014   Chemistry (most recent): Lab Results  Component Value Date   NA 141 11/14/2014   K 4.1 11/14/2014   CL 100 11/14/2014   CO2 31 11/14/2014   BUN 9 11/14/2014   CREATININE 0.73 11/14/2014   Diabetic Labs (most recent): Lab Results  Component Value Date   HGBA1C 9.0* 11/14/2014   HGBA1C 9.0* 07/10/2014   HGBA1C 9.2* 12/28/2013   Lipid profile (most recent): Lab Results  Component Value Date   TRIG 106 11/14/2014   CHOL 175 11/14/2014         Assessment & Plan:   1. Type 2 diabetes mellitus with vascular disease ([place])  - Patient has currently uncontrolled symptomatic type 2 DM since approximately 69  years of age,  with most recent A1c of  9%. Recent labs reviewed.    Her iabetes is complicated Coronary artery disease  and patient remains at a high risk for more acute and chronic complications of diabetes which include CAD, CVA, CKD, retinopathy, and neuropathy.  These are all discussed in detail with the patient.  - I have counseled the patient on diet management and weight loss, by adopting a carbohydrate restricted/protein rich diet.  - Suggestion is made for patient to avoid simple carbohydrates   from their diet including Cakes , Desserts, Ice Cream,  Soda (  diet and regular) , Sweet Tea , Candies,  Chips, Cookies, Artificial Sweeteners,   and "Sugar-free" Products . This will help patient to have stable blood glucose profile and potentially avoid unintended weight gain.  - I encouraged the patient to switch to  unprocessed or minimally processed complex starch and increased protein intake (animal or plant source), fruits, and vegetables.  - Patient is advised to  stick to a routine mealtimes to eat 3 meals  a day and avoid unnecessary snacks ( to snack only to correct hypoglycemia).  - The patient will be scheduled with Jearld Fenton, RDN, CDE for individualized DM education.  - I have approached patient with the following individualized plan to manage diabetes and patient agrees:   - I  will proceed with her  basal insulin Lantus 50 units QHS, associated with strict monitoring of glucose  AC and HS. -Based on her glucose readings and her commitment, she may need prandial insulin.  -Patient is encouraged to call clinic for blood glucose levels less than 70 or above 300 mg /dl. - I will continue MTF 1gm po BID,  therapeutically suitable for patient. - I will discontinue  Glipizide, risk outweighs benefit for this patient. - Patient will be considered for incretin therapy as appropriate next visit. - Patient specific target  A1c;  LDL, HDL, Triglycerides, and  Waist Circumference were discussed in detail.  2) BP/HTN: Controlled. Continue current medications including ACEI/ARB. 3) Lipids/HPL:  continue statins. 4)  Weight/Diet: CDE Consult will be initiated , exercise, and detailed carbohydrates information provided.  5) Other specified  hypothyroidism: This is postsurgical hypothyroidism due to history of thyroid cancer treated with thyroidectomy and possible radioactive iodine ablation in 2000. I will request for her records regarding therapy for the thyroid cancer. I advised her to continue Synthroid 180 g by mouth every morning.  - We discussed about correct intake of levothyroxine, at fasting, with water, separated by at least 30 minutes from breakfast, and separated by more than 4 hours from calcium, iron, multivitamins, acid reflux medications (PPIs). -Patient is made aware of the fact that thyroid hormone replacement is needed for life, dose to be adjusted by periodic monitoring of thyroid function tests.   6) Chronic Care/Health Maintenance:  -Patient is on ACEI/ARB and Statin medications and encouraged to continue to follow up with Ophthalmology, Podiatrist at least yearly or according to recommendations, and advised to   stay away from smoking. I have recommended yearly flu vaccine and pneumonia vaccination at least every 5 years; moderate intensity exercise for up to 150 minutes weekly; and  sleep for at least 7 hours a day.   Patient to bring meter and  blood glucose logs during their next visit.   I advised patient to maintain close follow up with their PCP for primary care needs. Follow up plan: Return in about 1 week (around 01/20/2015) for diabetes, high blood pressure, high cholesterol, underactive thyroid, follow up with meter and logs- no labs.  Glade Lloyd, MD Phone: [phone]  Fax: [phone]   01/13/2015, 2:43 PM

## 2015-01-14 ENCOUNTER — Telehealth: Payer: Self-pay | Admitting: *Deleted

## 2015-01-14 NOTE — Telephone Encounter (Signed)
Patient called wanting to been seen for a sinus infection, patient states it has been going on for weeks patient states she has been coughing up mucus. Please [phone]

## 2015-01-15 MED ORDER — AZITHROMYCIN 250 MG PO TABS: ORAL_TABLET | ORAL | Status: DC

## 2015-01-15 MED ORDER — BENZONATATE 100 MG PO CAPS: 100.0000 mg | ORAL_CAPSULE | Freq: Two times a day (BID) | ORAL | Status: DC | PRN

## 2015-01-15 NOTE — Telephone Encounter (Signed)
pls send z pack x 1 and  Tessalon perles 100 mg twice daily as needed #20 only, offer appt next week Monday for f/u and schedule pls Also advise UC if worsens despite treatment and prior to appt

## 2015-01-15 NOTE — Telephone Encounter (Signed)
meds sent and pt aware  

## 2015-01-15 NOTE — Addendum Note (Signed)
Addended by: Eual Fines on: 01/15/2015 12:46 PM   Modules accepted: Orders

## 2015-01-15 NOTE — Telephone Encounter (Signed)
States she has been having a lot of sinus congestion and coughing up dark colored mucus x 2 weeks. No fever but has been feeling bad. Wanted to be seen or something called into [facility]

## 2015-01-20 ENCOUNTER — Telehealth: Payer: Self-pay | Admitting: "Endocrinology

## 2015-01-20 ENCOUNTER — Other Ambulatory Visit: Payer: Self-pay | Admitting: Cardiovascular Disease

## 2015-01-20 MED ORDER — GLUCOSE BLOOD VI STRP: ORAL_STRIP | Status: DC

## 2015-01-20 NOTE — Addendum Note (Signed)
Addended by: Lavell Luster A on: 01/20/2015 09:10 AM   Modules accepted: Orders

## 2015-01-20 NOTE — Telephone Encounter (Signed)
Needs refill on aviva + strips sent to [facility] in [place] today please

## 2015-01-23 ENCOUNTER — Ambulatory Visit (INDEPENDENT_AMBULATORY_CARE_PROVIDER_SITE_OTHER): Payer: Medicare Other | Admitting: "Endocrinology

## 2015-01-23 ENCOUNTER — Encounter: Payer: Self-pay | Admitting: "Endocrinology

## 2015-01-23 VITALS — BP 129/72 | HR 74 | Ht 65.0 in | Wt 196.2 lb

## 2015-01-23 DIAGNOSIS — I2510 Atherosclerotic heart disease of native coronary artery without angina pectoris: Secondary | ICD-10-CM | POA: Diagnosis not present

## 2015-01-23 DIAGNOSIS — E785 Hyperlipidemia, unspecified: Secondary | ICD-10-CM | POA: Diagnosis not present

## 2015-01-23 DIAGNOSIS — E1159 Type 2 diabetes mellitus with other circulatory complications: Secondary | ICD-10-CM | POA: Diagnosis not present

## 2015-01-23 DIAGNOSIS — E890 Postprocedural hypothyroidism: Secondary | ICD-10-CM | POA: Diagnosis not present

## 2015-01-23 MED ORDER — INSULIN GLARGINE 100 UNIT/ML SOLOSTAR PEN: 54.0000 [IU] | PEN_INJECTOR | Freq: Every day | SUBCUTANEOUS | Status: DC

## 2015-01-23 NOTE — Patient Instructions (Signed)

## 2015-01-23 NOTE — Progress Notes (Signed)
Subjective:    Patient ID: Jennifer Blackburn, female    DOB: 06/06/1945,    Past Medical History  Diagnosis Date  . DJD (degenerative joint disease) of lumbar spine   . CAD (coronary artery disease)   . Diabetes mellitus type II     without complication  . Hypertension     benign   . Anxiety   . Thyroid cancer ([place]) 2001  . Hyperlipidemia   . Hypercholesteremia   . Depression    Past Surgical History  Procedure Laterality Date  . Vesicovaginal fistula closure w/ tah    . Thyroidectomy    . Knee surgery Right     arthroscopy  . Tonsillectomy    . Left hip replaced  07/01/2010  . Spine surgery  2006    cervical  . Abdominal hysterectomy    . Joint replacement  07/01/2010    left hip  . Cataract extraction w/phaco Right 07/17/2012    Procedure: CATARACT EXTRACTION PHACO AND INTRAOCULAR LENS PLACEMENT (IOC);  Surgeon: Tonny Branch, MD;  Location: [facility];  Service: Ophthalmology;  Laterality: Right;  CDE:25.51  . Colonoscopy    . Colonoscopy N/A 01/15/2014    Procedure: COLONOSCOPY;  Surgeon: Daneil Dolin, MD;  Location: [facility];  Service: Endoscopy;  Laterality: N/A;  9:00 AM  . Doppler echocardiography    . Stress dipyridamole myocardial perfusion    . Cardiac catheterization     Social History   Social History  . Marital Status: Married    Spouse Name: N/A  . Number of Children: N/A  . Years of Education: N/A   Occupational History  . retired     Social History Main Topics  . Smoking status: Passive Smoke Exposure - Never Smoker  . Smokeless tobacco: Never Used  . Alcohol Use: No  . Drug Use: No  . Sexual Activity: Not Asked   Other Topics Concern  . None   Social History Narrative   Outpatient Encounter Prescriptions as of 01/23/2015  Medication Sig  . CINNAMON PO Take by mouth.  . gabapentin (NEURONTIN) 100 MG capsule Take 1 capsule (100 mg total) by mouth 3 (three) times daily.  Marland Kitchen glucose blood (ACCU-CHEK AVIVA PLUS) test strip Use as  instructed four times daily dx E11.65  . imipramine (TOFRANIL) 25 MG tablet TAKE 4 TABLETS BY MOUTH EVERY NIGHT AT BEDTIME  . Insulin Glargine (LANTUS) 100 UNIT/ML Solostar Pen Inject 54 Units into the skin daily at 10 pm.  . Insulin Pen Needle (B-D ULTRAFINE III SHORT PEN) 31G X 8 MM MISC Use to inject insulin daily  . Lancets (ACCU-CHEK MULTICLIX) lancets Use as instructed three times daily dx 250.01  . levothyroxine (SYNTHROID) 200 MCG tablet Take 1 tablet (200 mcg total) by mouth daily before breakfast.  . metFORMIN (GLUCOPHAGE) 1000 MG tablet TAKE 1 TABLET BY MOUTH TWICE DAILY  . metoprolol (LOPRESSOR) 50 MG tablet TAKE 1 TABLET BY MOUTH TWICE DAILY  . PARoxetine (PAXIL) 20 MG tablet Take 1 tablet (20 mg total) by mouth daily.  . pravastatin (PRAVACHOL) 80 MG tablet TAKE 1 TABLET BY MOUTH EVERY DAY  . quinapril (ACCUPRIL) 10 MG tablet TAKE 1 TABLET BY MOUTH DAILY  . tretinoin (RETIN-A) 0.05 % cream Apply 1 application topically every other day.  . verapamil (CALAN-SR) 180 MG CR tablet Take 180 mg by mouth at bedtime.  . verapamil (VERELAN PM) 180 MG 24 hr capsule TAKE 1 CAPSULE BY MOUTH EVERY DAY  .  Vitamin D, Ergocalciferol, (DRISDOL) 50000 UNITS CAPS capsule Take 1 capsule (50,000 Units total) by mouth every 7 (seven) days.  . [DISCONTINUED] Insulin Glargine (LANTUS) 100 UNIT/ML Solostar Pen Inject 55 Units into the skin daily at 10 pm. (Patient taking differently: Inject 50 Units into the skin daily at 10 pm. )  . azithromycin (ZITHROMAX) 250 MG tablet Use as directed (Patient not taking: Reported on 01/23/2015)  . benzonatate (TESSALON) 100 MG capsule Take 1 capsule (100 mg total) by mouth 2 (two) times daily as needed for cough. (Patient not taking: Reported on 01/23/2015)  . Menthol-Methyl Salicylate (MUSCLE RUB) 10-15 % CREA Apply 1 application topically as needed for muscle pain.  Marland Kitchen temazepam (RESTORIL) 7.5 MG capsule Take 1 capsule (7.5 mg total) by mouth at bedtime as needed for  sleep. (Patient not taking: Reported on 01/23/2015)  . triamcinolone (NASACORT) 55 MCG/ACT AERO nasal inhaler Place 2 sprays into the nose daily. (Patient not taking: Reported on 01/23/2015)  . vitamin B-12 (CYANOCOBALAMIN) 1000 MCG tablet Take 1 tablet (1,000 mcg total) by mouth daily. (Patient not taking: Reported on 01/23/2015)   No facility-administered encounter medications on file as of 01/23/2015.   ALLERGIES: Allergies  Allergen Reactions  . Nsaids Hives  . Sulfonamide Derivatives Hives   VACCINATION STATUS: Immunization History  Administered Date(s) Administered  . Influenza Split 03/10/2012  . Influenza Whole 01/04/2007  . Influenza,inj,Quad PF,36+ Mos 11/28/2012, 12/31/2013  . Pneumococcal Conjugate-13 07/15/2014  . Pneumococcal Polysaccharide-23 02/17/2004, 09/07/2012  . Tdap 12/21/2010    Diabetes She presents for her initial diabetic visit. She has type 2 diabetes mellitus. Onset time: She was diagnosed at approximate age of 65 years. Her disease course has been improving. There are no hypoglycemic associated symptoms. Pertinent negatives for hypoglycemia include no confusion, headaches, pallor or seizures. Pertinent negatives for diabetes include no chest pain, no fatigue, no polydipsia, no polyphagia and no polyuria. There are no hypoglycemic complications. Symptoms are worsening. Diabetic complications include heart disease. Risk factors for coronary artery disease include dyslipidemia, diabetes mellitus and hypertension. Current diabetic treatment includes insulin injections and oral agent (dual therapy). She is compliant with treatment most of the time. Her weight is stable. She is following a generally unhealthy diet. She rarely participates in exercise. Her home blood glucose trend is decreasing steadily. Her overall blood glucose range is 180-200 mg/dl. An ACE inhibitor/angiotensin II receptor blocker is being taken. Eye exam is current.  Hyperlipidemia This is a chronic  problem. The current episode started more than 1 year ago. The problem is controlled. Exacerbating diseases include hypothyroidism. Pertinent negatives include no chest pain, myalgias or shortness of breath. Current antihyperlipidemic treatment includes statins.  Hypertension This is a chronic problem. The current episode started more than 1 year ago. Pertinent negatives include no chest pain, headaches, palpitations or shortness of breath. Risk factors for coronary artery disease include dyslipidemia, diabetes mellitus, obesity and sedentary lifestyle. Hypertensive end-organ damage includes a thyroid problem.  Thyroid Problem Presents for initial visit. Onset time: She underwent thyroidectomy for reported history of thyroid cancer following with Dr. Francoise Schaumann in [place]. She was to transfer her care to [facility]. She is willing to sign a medical release form for [place] to obtain her records. Patient reports no cold intolerance, diarrhea, fatigue, heat intolerance or palpitations. Treatments tried: she is currently on Synthroid 180 g by mouth every morning. Her past medical history is significant for hyperlipidemia.       Review of Systems  Constitutional: Negative for fatigue  and unexpected weight change.  HENT: Negative for trouble swallowing and voice change.   Eyes: Negative for visual disturbance.  Respiratory: Negative for cough, shortness of breath and wheezing.   Cardiovascular: Negative for chest pain, palpitations and leg swelling.  Gastrointestinal: Negative for nausea, vomiting and diarrhea.  Endocrine: Negative for cold intolerance, heat intolerance, polydipsia, polyphagia and polyuria.  Musculoskeletal: Negative for myalgias and arthralgias.  Skin: Negative for color change, pallor, rash and wound.  Neurological: Negative for seizures and headaches.  Psychiatric/Behavioral: Negative for suicidal ideas and confusion.    Objective:    BP 129/72 mmHg  Pulse 74  Ht 5\' 5"  (1.651  m)  Wt 196 lb 3.2 oz (88.996 kg)  BMI 32.65 kg/m2  SpO2 97%  Wt Readings from Last 3 Encounters:  01/23/15 196 lb 3.2 oz (88.996 kg)  01/13/15 195 lb (88.451 kg)  11/19/14 198 lb (89.812 kg)    Physical Exam  Constitutional: She is oriented to person, place, and time. She appears well-developed.  HENT:  Head: Normocephalic and atraumatic.  Eyes: EOM are normal.  Neck: Normal range of motion. Neck supple. No tracheal deviation present. No thyromegaly present.  Anterior lower neck scar from prior thyroidectomy for thyroid cancer.  Cardiovascular: Normal rate and regular rhythm.   Pulmonary/Chest: Effort normal and breath sounds normal.  Abdominal: Soft. Bowel sounds are normal. There is no tenderness. There is no guarding.  Musculoskeletal: Normal range of motion. She exhibits no edema.  Neurological: She is alert and oriented to person, place, and time. She has normal reflexes. No cranial nerve deficit. Coordination normal.  Skin: Skin is warm and dry. No rash noted. No erythema. No pallor.  Psychiatric: She has a normal mood and affect. Judgment normal.    Results for orders placed or performed in visit on 10/25/14  HM DIABETES EYE EXAM  Result Value Ref Range   HM Diabetic Eye Exam No Retinopathy No Retinopathy   Complete Blood Count (Most recent): Lab Results  Component Value Date   WBC 6.1 08/20/2014   HGB 11.6* 08/20/2014   HCT 35.9* 08/20/2014   MCV 83.9 08/20/2014   PLT 254 08/20/2014   Chemistry (most recent): Lab Results  Component Value Date   NA 141 11/14/2014   K 4.1 11/14/2014   CL 100 11/14/2014   CO2 31 11/14/2014   BUN 9 11/14/2014   CREATININE 0.73 11/14/2014   Diabetic Labs (most recent): Lab Results  Component Value Date   HGBA1C 9.0* 11/14/2014   HGBA1C 9.0* 07/10/2014   HGBA1C 9.2* 12/28/2013   Lipid profile (most recent): Lab Results  Component Value Date   TRIG 106 11/14/2014   CHOL 175 11/14/2014         Assessment & Plan:    1. Type 2 diabetes mellitus with vascular disease ([place])  - Patient has currently uncontrolled symptomatic type 2 DM since approximately 69  years of age,  with most recent A1c of  9%. Recent labs reviewed.    Her iabetes is complicated Coronary artery disease  and patient remains at a high risk for more acute and chronic complications of diabetes which include CAD, CVA, CKD, retinopathy, and neuropathy. These are all discussed in detail with the patient.  - I have counseled the patient on diet management and weight loss, by adopting a carbohydrate restricted/protein rich diet.  - Suggestion is made for patient to avoid simple carbohydrates   from their diet including Cakes , Desserts, Ice Cream,  Soda (  diet and regular) , Sweet Tea , Candies,  Chips, Cookies, Artificial Sweeteners,   and "Sugar-free" Products . This will help patient to have stable blood glucose profile and potentially avoid unintended weight gain.  - I encouraged the patient to switch to  unprocessed or minimally processed complex starch and increased protein intake (animal or plant source), fruits, and vegetables.  - Patient is advised to stick to a routine mealtimes to eat 3 meals  a day and avoid unnecessary snacks ( to snack only to correct hypoglycemia).  - The patient will be scheduled with Jearld Fenton, RDN, CDE for individualized DM education.  - I have approached patient with the following individualized plan to manage diabetes and patient agrees:   - I  Will increase her  Lantus to 54 units QHS, associated with strict monitoring of glucose QAM.  -Based on her glucose readings and her commitment, she will not  need prandial insulin for now.  -Patient is encouraged to call clinic for blood glucose levels less than 70 or above 300 mg /dl. - I will continue MTF 1gm po BID,  therapeutically suitable for patient.  - She has hx of thyroid cancer ( awaiting for her records) will wait before initiating  incretin  therapy. - Patient specific target  A1c;  LDL, HDL, Triglycerides, and  Waist Circumference were discussed in detail.  2) BP/HTN: Controlled. Continue current medications including ACEI/ARB. 3) Lipids/HPL:  continue statins. 4)  Weight/Diet: CDE Consult will be initiated , exercise, and detailed carbohydrates information provided.  5) Other specified hypothyroidism: This is postsurgical hypothyroidism due to history of thyroid cancer treated with thyroidectomy and possible radioactive iodine ablation in 2000. I will request for her records regarding therapy for the thyroid cancer. I advised her to continue Synthroid 200 g by mouth every morning.  - We discussed about correct intake of levothyroxine, at fasting, with water, separated by at least 30 minutes from breakfast, and separated by more than 4 hours from calcium, iron, multivitamins, acid reflux medications (PPIs). -Patient is made aware of the fact that thyroid hormone replacement is needed for life, dose to be adjusted by periodic monitoring of thyroid function tests.   6) Chronic Care/Health Maintenance:  -Patient is on ACEI/ARB and Statin medications and encouraged to continue to follow up with Ophthalmology, Podiatrist at least yearly or according to recommendations, and advised to   stay away from smoking. I have recommended yearly flu vaccine and pneumonia vaccination at least every 5 years; moderate intensity exercise for up to 150 minutes weekly; and  sleep for at least 7 hours a day.   Patient to bring meter and  blood glucose logs during their next visit.   I advised patient to maintain close follow up with their PCP for primary care needs. Follow up plan: Return in about 3 months (around 04/25/2015) for diabetes, high blood pressure, underactive thyroid, high cholesterol, follow up with pre-visit labs, meter, and logs.  Glade Lloyd, MD Phone: [phone]  Fax: [phone]   01/23/2015, 5:09 PM

## 2015-02-05 ENCOUNTER — Telehealth: Payer: Self-pay | Admitting: Family Medicine

## 2015-02-05 NOTE — Telephone Encounter (Signed)
Opened in Error.

## 2015-02-06 ENCOUNTER — Ambulatory Visit: Payer: Self-pay | Admitting: *Deleted

## 2015-02-06 DIAGNOSIS — M65342 Trigger finger, left ring finger: Secondary | ICD-10-CM | POA: Diagnosis not present

## 2015-02-11 ENCOUNTER — Encounter: Payer: Self-pay | Admitting: *Deleted

## 2015-02-11 ENCOUNTER — Encounter: Payer: Medicare Other | Attending: Family Medicine | Admitting: *Deleted

## 2015-02-11 VITALS — Ht 65.0 in | Wt 195.0 lb

## 2015-02-11 DIAGNOSIS — Z713 Dietary counseling and surveillance: Secondary | ICD-10-CM | POA: Diagnosis not present

## 2015-02-11 DIAGNOSIS — Z794 Long term (current) use of insulin: Secondary | ICD-10-CM | POA: Diagnosis not present

## 2015-02-11 DIAGNOSIS — E119 Type 2 diabetes mellitus without complications: Secondary | ICD-10-CM | POA: Diagnosis not present

## 2015-02-11 NOTE — Progress Notes (Signed)
Diabetes Self-Management Education  Visit Type: First/Initial  Appt. Start Time: 1400 Appt. End Time: 1530  02/11/2015  [name], identified by name and date of birth, is a 69 y.o. female with a diagnosis of Diabetes: Type 2. Jennifer Blackburn come for DSME with an A1c of 9%. This is the highest it has ever been. In review of her dietary intake, she has opportunities for better choices and portion control. She lacks structured exercise but intends on joining the [facility] and working in the water or [employer].   ASSESSMENT  Height 5\' 5"  (1.651 m), weight 195 lb (88.451 kg). Body mass index is 32.45 kg/(m^2).      Diabetes Self-Management Education - 02/11/15 1418    Visit Information   Visit Type First/Initial   Initial Visit   Diabetes Type Type 2   Are you currently following a meal plan? No   Are you taking your medications as prescribed? Yes   Health Coping   How would you rate your overall health? Good   Psychosocial Assessment   Patient Belief/Attitude about Diabetes Motivated to manage diabetes   Self-care barriers None   Self-management support Doctor's office;Family;CDE visits   Other persons present Patient   Patient Concerns Nutrition/Meal planning;Weight Control;Glycemic Control   Special Needs None   Preferred Learning Style No preference indicated   Learning Readiness Change in progress   How often do you need to have someone help you when you read instructions, pamphlets, or other written materials from your doctor or pharmacy? 1 - Never   What is the last grade level you completed in school? 32yrs college   Complications   Last HgB A1C per patient/outside source 9 %   How often do you check your blood sugar? 1-2 times/day   Postprandial Blood glucose range (mg/dL) >200  4 1/2hpp this date 249mg /dl   Have you had a dilated eye exam in the past 12 months? Yes   Have you had a dental exam in the past 12 months? Yes   Are you checking your feet? Yes   How many  days per week are you checking your feet? 5   Dietary Intake   Breakfast egg, toastX1 / 2 toast & cheese , coffee / oatmeal, 1/2 banana, coffee   Lunch sandwich [place], mayonaise,/  low sodium tomato soup & crackers, / lima, corn, tomato   Beverage(s) coffee with 1tsp sugar, unsweet tea, water   Exercise   Exercise Type Light (walking / raking leaves)  has difficulty walking incommunity since h ip replacement   How many days per week to you exercise? 2   How many minutes per day do you exercise? 30   Total minutes per week of exercise 60   Patient Education   Previous Diabetes Education Yes (please comment)   Nutrition management  Role of diet in the treatment of diabetes and the relationship between the three main macronutrients and blood glucose level;Food label reading, portion sizes and measuring food.;Carbohydrate counting;Reviewed blood glucose goals for pre and post meals and how to evaluate the patients' food intake on their blood glucose level.;Information on hints to eating out and maintain blood glucose control.   Physical activity and exercise  Role of exercise on diabetes management, blood pressure control and cardiac health.;Helped patient identify appropriate exercises in relation to his/her diabetes, diabetes complications and other health issue.;Other (comment)  encouraged water walking, water aerobics to relieve strain on hip   Monitoring Identified appropriate SMBG and/or A1C goals.  Chronic complications Relationship between chronic complications and blood glucose control   Psychosocial adjustment Role of stress on diabetes   Personal strategies to promote health Lifestyle issues that need to be addressed for better diabetes care;Other (comment)  Patient is very "giving". 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   Individualized Goals (developed by patient)   Nutrition General guidelines for healthy choices and portions discussed   Physical Activity  Exercise 3-5 times per week;30 minutes per day   Medications take my medication as prescribed   Monitoring  test my blood glucose as discussed   Problem Solving Learn to say NO (per her daughter)   Outcomes   Expected Outcomes Demonstrated interest in learning. Expect positive outcomes   Future DMSE PRN   Program Status Completed      Individualized Plan for Diabetes Self-Management Training:   Learning Objective:  Patient will have a greater understanding of diabetes self-management. Patient education plan is to attend individual and/or group sessions per assessed needs and concerns.   Plan:   Patient Instructions  Plan:  Aim for 2 Carb Choices per meal (30 grams) +/- 1 either way  Include protein in moderation with your meals and snacks Consider reading food labels for Total Carbohydrate and Fat Grams of foods Consider  increasing your activity level by joining the [facility], or walk for 150 minutes a week as tolerated Continue checking BG at alternate times per day as directed by MD  continue taking medication as directed by MD  Consider Lynnae Sandhoff 45 calorie bread  No snacking per Dr. Dorris Fetch  Expected Outcomes:  Demonstrated interest in learning. Expect positive outcomes  Education material provided: Living Well with Diabetes, A1C conversion sheet, Meal plan card and My Plate  If problems or questions, patient to contact team via:  Phone  Future DSME appointment: PRN

## 2015-02-11 NOTE — Patient Instructions (Signed)
Plan:  Aim for 2 Carb Choices per meal (30 grams) +/- 1 either way  Include protein in moderation with your meals and snacks Consider reading food labels for Total Carbohydrate and Fat Grams of foods Consider  increasing your activity level by joining the [facility], or walk for 150 minutes a week as tolerated Continue checking BG at alternate times per day as directed by MD  continue taking medication as directed by MD  Consider Lynnae Sandhoff 45 calorie bread

## 2015-02-12 ENCOUNTER — Other Ambulatory Visit: Payer: Self-pay | Admitting: Family Medicine

## 2015-02-15 IMAGING — DX DG CHEST 2V
2 series · 2 of 2 positions shown · non-contrast
Comparison: 03/25/2011

CLINICAL DATA: LEFT chest pain, intermittent lightheadedness,
symptoms for 1 week, history coronary artery disease, diabetes,
hypertension, thyroid cancer

EXAM:
CHEST  2 VIEW

[chest pa]
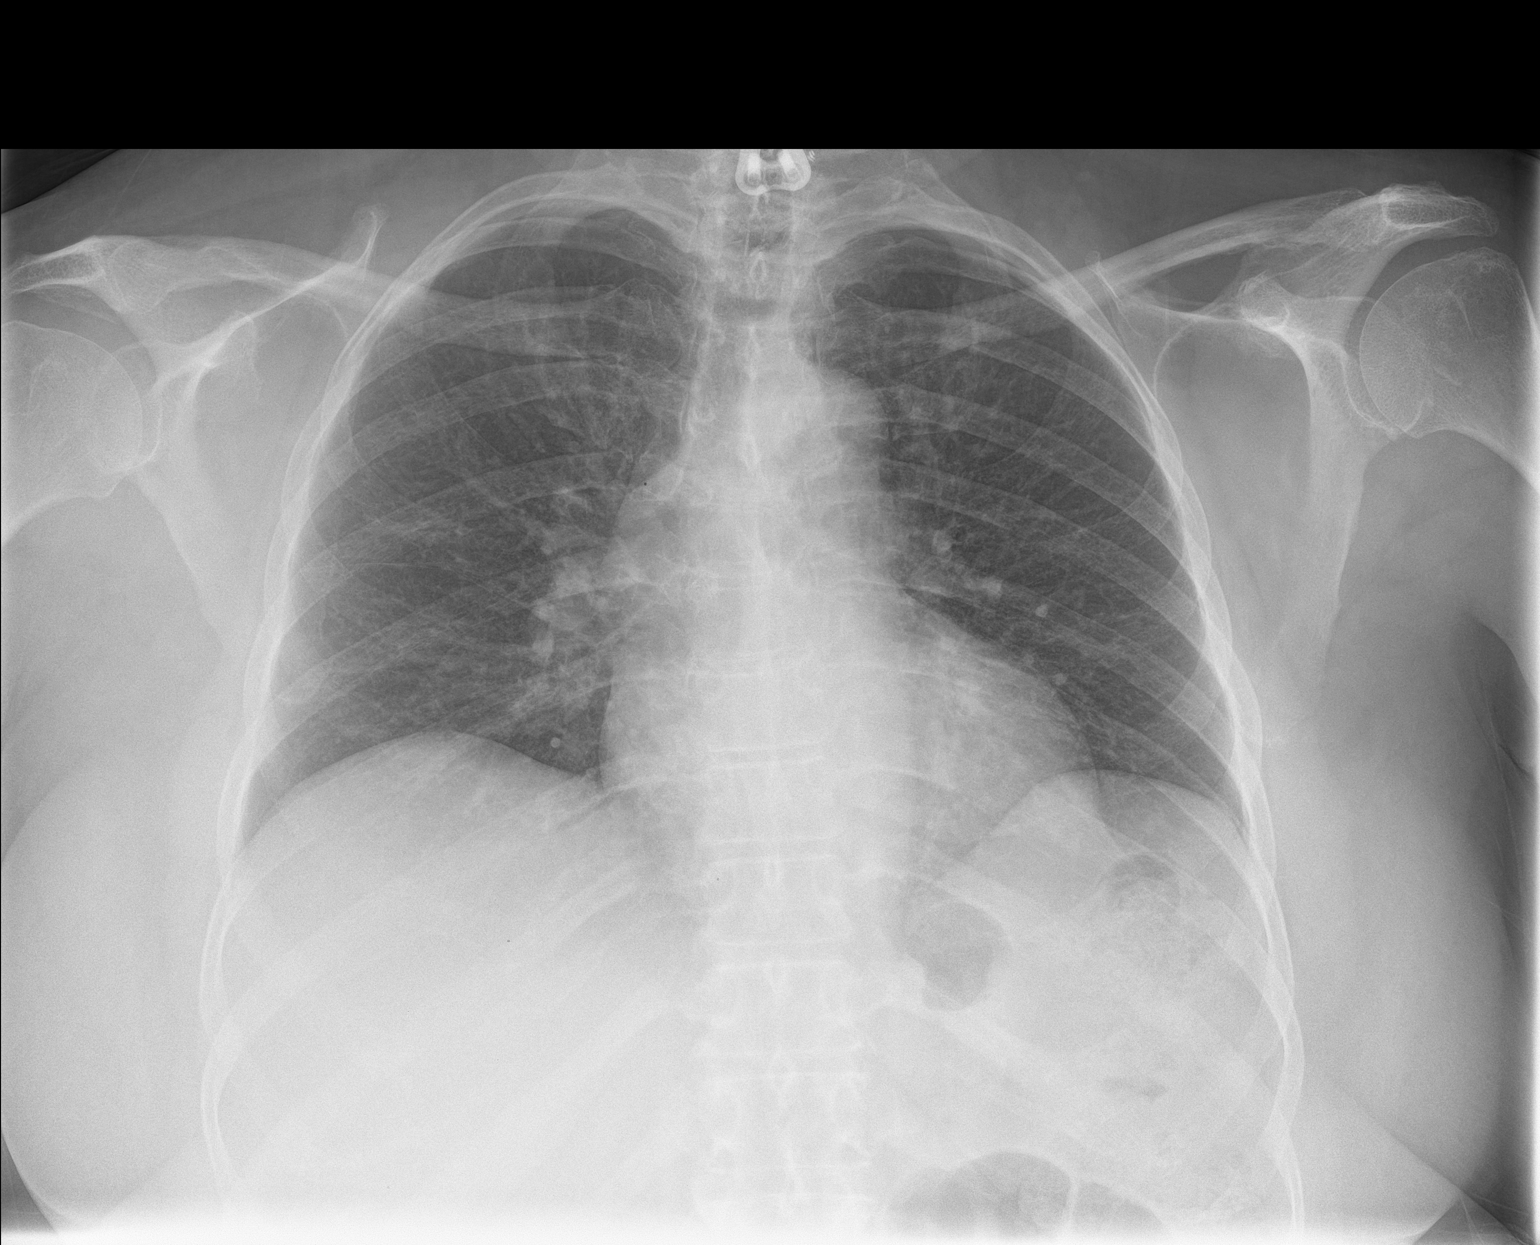

[chest lat]
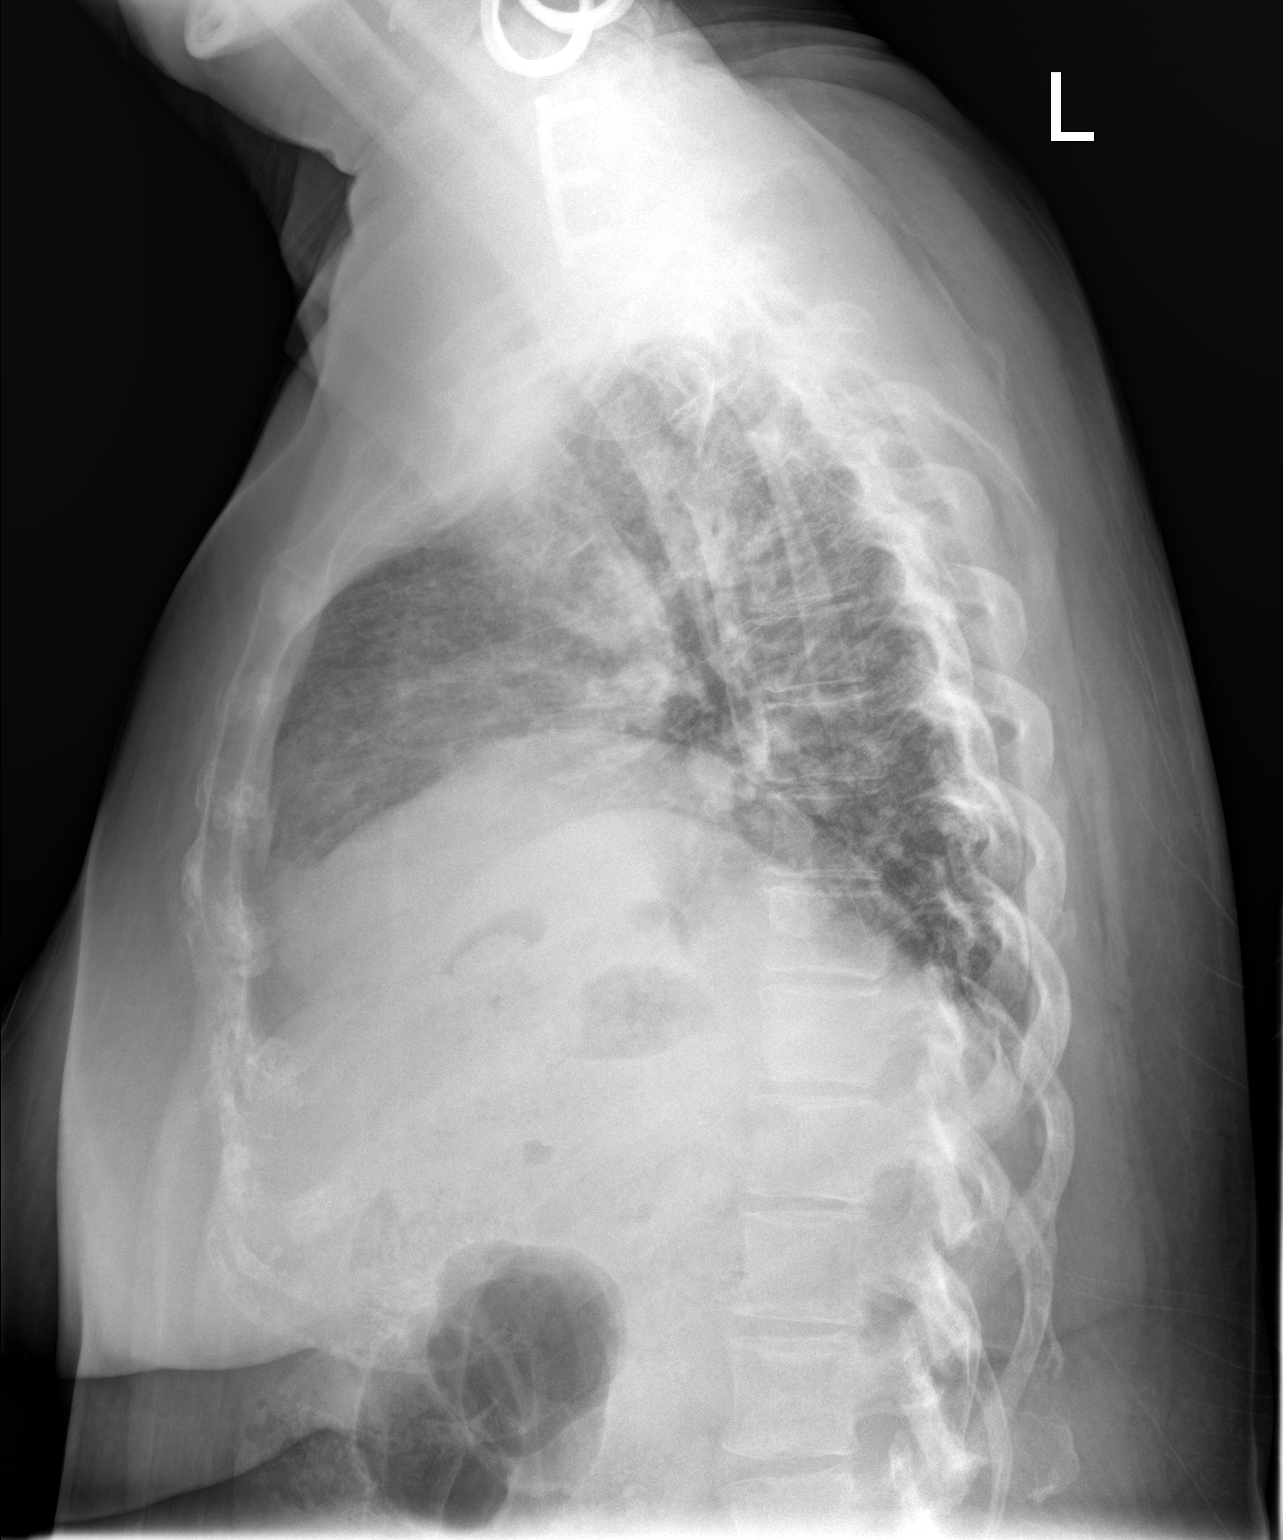

[2 of 2 positions shown; findings below may reference images not displayed]

FINDINGS: Borderline enlargement of cardiac silhouette.

Normal mediastinal contours and pulmonary vascularity.

Lungs clear.

No pleural effusion or pneumothorax.

Bones demineralized.

Prior cervical spine fusion.

Low inspiratory lung volumes on lateral view noted.
IMPRESSION: Borderline enlargement of cardiac silhouette.

No acute abnormalities.

## 2015-02-19 ENCOUNTER — Encounter: Payer: Self-pay | Admitting: Cardiovascular Disease

## 2015-02-19 ENCOUNTER — Ambulatory Visit (INDEPENDENT_AMBULATORY_CARE_PROVIDER_SITE_OTHER): Payer: Medicare Other | Admitting: Cardiovascular Disease

## 2015-02-19 VITALS — BP 134/74 | HR 70 | Ht 65.0 in | Wt 195.0 lb

## 2015-02-19 DIAGNOSIS — I10 Essential (primary) hypertension: Secondary | ICD-10-CM

## 2015-02-19 DIAGNOSIS — E785 Hyperlipidemia, unspecified: Secondary | ICD-10-CM

## 2015-02-19 DIAGNOSIS — I2510 Atherosclerotic heart disease of native coronary artery without angina pectoris: Secondary | ICD-10-CM

## 2015-02-19 DIAGNOSIS — E118 Type 2 diabetes mellitus with unspecified complications: Secondary | ICD-10-CM | POA: Diagnosis not present

## 2015-02-19 DIAGNOSIS — R0789 Other chest pain: Secondary | ICD-10-CM | POA: Diagnosis not present

## 2015-02-19 DIAGNOSIS — E782 Mixed hyperlipidemia: Secondary | ICD-10-CM | POA: Diagnosis not present

## 2015-02-19 DIAGNOSIS — C73 Malignant neoplasm of thyroid gland: Secondary | ICD-10-CM | POA: Diagnosis not present

## 2015-02-19 NOTE — Assessment & Plan Note (Signed)
Jennifer Blackburn has complained of several episodes of atypical chest pain/pressure which awakened her from sleep. Given the fact that her last cath was 12 years ago and she has significant risk factors I'm going to get a pharmacologic Myoview stress test to rule out an ischemic etiology

## 2015-02-19 NOTE — Assessment & Plan Note (Signed)
History of hyperlipidemia on pravastatin with recent lipid profile performed by her PCP 11/14/14 revealing a total cholesterol 175, LDL 112. HDL 42

## 2015-02-19 NOTE — Progress Notes (Signed)
02/19/2015 Jennifer Blackburn   [birth year]  [id number]  Primary Physician Tula Nakayama, MD Primary Cardiologist: Lorretta Harp MD Renae Gloss   HPI:  : The patient is a 69 year old, moderately overweight, married [place] American female, mother of 2, grandmother to 4 grandchildren who I last saw in the office 12 months ago. She has a history of minimal CAD by cath back in June 2005 with normal LV function. Her other problems include treated hypertension, diabetes and dyslipidemia. She does have a strong family history of heart disease with a father that died of an MI at age 70. Most recent lipid profile performed by Dr. Moshe Cipro on 11/14/14 revealing a total cholesterol 175, LDL of 112 and HDL of 42.. Dr. Moshe Cipro continues to follow her with profile . She has had several episodes of nocturnal chest pressure which has awakened her from sleep with a "heavy sensation on her chest and a feeling of diaphoresis..    Current Outpatient Prescriptions  Medication Sig Dispense Refill  . CINNAMON PO Take by mouth.    . gabapentin (NEURONTIN) 100 MG capsule Take 1 capsule (100 mg total) by mouth 3 (three) times daily. 90 capsule 3  . glucose blood (ACCU-CHEK AVIVA PLUS) test strip Use as instructed four times daily dx E11.65 150 each 5  . imipramine (TOFRANIL) 25 MG tablet TAKE 4 TABLETS BY MOUTH EVERY NIGHT AT BEDTIME 120 tablet 2  . Insulin Glargine (LANTUS) 100 UNIT/ML Solostar Pen Inject 54 Units into the skin daily at 10 pm. 15 mL 11  . Insulin Pen Needle (B-D ULTRAFINE III SHORT PEN) 31G X 8 MM MISC Use to inject insulin daily 50 each 1  . Lancets (ACCU-CHEK MULTICLIX) lancets Use as instructed three times daily dx 250.01 100 each 5  . levothyroxine (SYNTHROID) 200 MCG tablet Take 1 tablet (200 mcg total) by mouth daily before breakfast. 30 tablet 5  . loratadine (CLARITIN) 10 MG tablet Take 10 mg by mouth daily.    . Menthol-Methyl Salicylate (MUSCLE RUB) 10-15 % CREA Apply  1 application topically as needed for muscle pain.    . metFORMIN (GLUCOPHAGE) 1000 MG tablet TAKE 1 TABLET BY MOUTH TWICE DAILY 60 tablet 1  . metoprolol (LOPRESSOR) 50 MG tablet TAKE 1 TABLET BY MOUTH TWICE DAILY 60 tablet 3  . PARoxetine (PAXIL) 20 MG tablet Take 1 tablet (20 mg total) by mouth daily. 30 tablet 5  . pravastatin (PRAVACHOL) 80 MG tablet TAKE 1 TABLET BY MOUTH EVERY DAY 30 tablet 2  . quinapril (ACCUPRIL) 10 MG tablet TAKE 1 TABLET BY MOUTH DAILY 30 tablet 3  . tretinoin (RETIN-A) 0.05 % cream Apply 1 application topically every other day.    . verapamil (VERELAN PM) 180 MG 24 hr capsule TAKE 1 CAPSULE BY MOUTH EVERY DAY 90 capsule 1  . vitamin B-12 (CYANOCOBALAMIN) 1000 MCG tablet Take 1 tablet (1,000 mcg total) by mouth daily. 30 tablet 5  . Vitamin D, Ergocalciferol, (DRISDOL) 50000 UNITS CAPS capsule Take 1 capsule (50,000 Units total) by mouth every 7 (seven) days. 4 capsule 5   No current facility-administered medications for this visit.    Allergies  Allergen Reactions  . Nsaids Hives  . Sulfonamide Derivatives Hives    Social History   Social History  . Marital Status: Married    Spouse Name: N/A  . Number of Children: N/A  . Years of Education: N/A   Occupational History  . retired     Science writer  History Main Topics  . Smoking status: Passive Smoke Exposure - Never Smoker  . Smokeless tobacco: Never Used  . Alcohol Use: No  . Drug Use: No  . Sexual Activity: Not on file   Other Topics Concern  . Not on file   Social History Narrative     Review of Systems: General: negative for chills, fever, night sweats or weight changes.  Cardiovascular: negative for chest pain, dyspnea on exertion, edema, orthopnea, palpitations, paroxysmal nocturnal dyspnea or shortness of breath Dermatological: negative for rash Respiratory: negative for cough or wheezing Urologic: negative for hematuria Abdominal: negative for nausea, vomiting, diarrhea, bright red blood  per rectum, melena, or hematemesis Neurologic: negative for visual changes, syncope, or dizziness All other systems reviewed and are otherwise negative except as noted above.    Blood pressure 134/74, pulse 70, height 5\' 5"  (1.651 m), weight 195 lb (88.451 kg).  General appearance: alert and no distress Neck: no adenopathy, no carotid bruit, no JVD, supple, symmetrical, trachea midline and thyroid not enlarged, symmetric, no tenderness/mass/nodules Lungs: clear to auscultation bilaterally Heart: regular rate and rhythm, S1, S2 normal, no murmur, click, rub or gallop Extremities: extremities normal, atraumatic, no cyanosis or edema  EKG normal sinus rhythm at 70 with poor R-wave progression consistent with anterior MI. Personal review this EKG  ASSESSMENT AND PLAN:   Hyperlipidemia History of hyperlipidemia on pravastatin with recent lipid profile performed by her PCP 11/14/14 revealing a total cholesterol 175, LDL 112. HDL 42  ESSENTIAL HYPERTENSION, BENIGN History of hypertension blood pressure measurements at 134/74.  She is on metoprolol, verapamil and quinapril. Continue current meds at current dosing  Coronary atherosclerosis History of noncritical CAD documented by cardiac catheterization June 2005.  Atypical chest pain Mrs. Shinkle has complained of several episodes of atypical chest pain/pressure which awakened her from sleep. Given the fact that her last cath was 12 years ago and she has significant risk factors I'm going to get a pharmacologic Myoview stress test to rule out an ischemic etiology      Lorretta Harp MD [facility], [facility] 02/19/2015 2:10 PM

## 2015-02-19 NOTE — Assessment & Plan Note (Signed)
History of noncritical CAD documented by cardiac catheterization June 2005.

## 2015-02-19 NOTE — Patient Instructions (Signed)
Medication Instructions:  Your physician recommends that you continue on your current medications as directed. Please refer to the Current Medication list given to you today.   Labwork: none  Testing/Procedures: Your physician has requested that you have a lexiscan myoview. For further information please visit [email or web address]. Please follow instruction sheet, as given.    Follow-Up: Your physician wants you to follow-up in: 12 months with Dr. Gwenlyn Found. You will receive a reminder letter in the mail two months in advance. If you don't receive a letter, please call our office to schedule the follow-up appointment.   Any Other Special Instructions Will Be Listed Below (If Applicable).     If you need a refill on your cardiac medications before your next appointment, please call your pharmacy.

## 2015-02-19 NOTE — Assessment & Plan Note (Signed)
History of hypertension blood pressure measurements at 134/74.  She is on metoprolol, verapamil and quinapril. Continue current meds at current dosing

## 2015-02-21 ENCOUNTER — Other Ambulatory Visit: Payer: Self-pay | Admitting: Cardiovascular Disease

## 2015-02-26 DIAGNOSIS — E1159 Type 2 diabetes mellitus with other circulatory complications: Secondary | ICD-10-CM | POA: Diagnosis not present

## 2015-02-26 DIAGNOSIS — E669 Obesity, unspecified: Secondary | ICD-10-CM | POA: Diagnosis not present

## 2015-02-26 DIAGNOSIS — E785 Hyperlipidemia, unspecified: Secondary | ICD-10-CM | POA: Diagnosis not present

## 2015-02-26 DIAGNOSIS — E039 Hypothyroidism, unspecified: Secondary | ICD-10-CM | POA: Diagnosis not present

## 2015-02-26 DIAGNOSIS — C73 Malignant neoplasm of thyroid gland: Secondary | ICD-10-CM | POA: Diagnosis not present

## 2015-02-26 DIAGNOSIS — I10 Essential (primary) hypertension: Secondary | ICD-10-CM | POA: Diagnosis not present

## 2015-02-28 ENCOUNTER — Inpatient Hospital Stay (HOSPITAL_COMMUNITY): Admission: RE | Admit: 2015-02-28 | Payer: Self-pay | Source: Ambulatory Visit

## 2015-02-28 ENCOUNTER — Telehealth (HOSPITAL_COMMUNITY): Payer: Self-pay

## 2015-02-28 ENCOUNTER — Other Ambulatory Visit: Payer: Self-pay | Admitting: Family Medicine

## 2015-02-28 NOTE — Telephone Encounter (Signed)
Encounter complete. 

## 2015-03-03 DIAGNOSIS — Z23 Encounter for immunization: Secondary | ICD-10-CM | POA: Diagnosis not present

## 2015-03-05 ENCOUNTER — Ambulatory Visit (HOSPITAL_COMMUNITY)
Admission: RE | Admit: 2015-03-05 | Discharge: 2015-03-05 | Disposition: A | Discharge status: Home / Self Care | Payer: Medicare Other | Source: Ambulatory Visit | Attending: Cardiology | Admitting: Cardiology

## 2015-03-05 DIAGNOSIS — R0609 Other forms of dyspnea: Secondary | ICD-10-CM | POA: Diagnosis not present

## 2015-03-05 DIAGNOSIS — Z8249 Family history of ischemic heart disease and other diseases of the circulatory system: Secondary | ICD-10-CM | POA: Insufficient documentation

## 2015-03-05 DIAGNOSIS — R0789 Other chest pain: Secondary | ICD-10-CM | POA: Diagnosis not present

## 2015-03-05 DIAGNOSIS — R5383 Other fatigue: Secondary | ICD-10-CM | POA: Insufficient documentation

## 2015-03-05 DIAGNOSIS — L680 Hirsutism: Secondary | ICD-10-CM | POA: Diagnosis not present

## 2015-03-05 DIAGNOSIS — I10 Essential (primary) hypertension: Secondary | ICD-10-CM | POA: Insufficient documentation

## 2015-03-05 DIAGNOSIS — E669 Obesity, unspecified: Secondary | ICD-10-CM | POA: Insufficient documentation

## 2015-03-05 DIAGNOSIS — E119 Type 2 diabetes mellitus without complications: Secondary | ICD-10-CM | POA: Diagnosis not present

## 2015-03-05 DIAGNOSIS — L821 Other seborrheic keratosis: Secondary | ICD-10-CM | POA: Diagnosis not present

## 2015-03-05 DIAGNOSIS — Z6832 Body mass index (BMI) 32.0-32.9, adult: Secondary | ICD-10-CM | POA: Diagnosis not present

## 2015-03-05 DIAGNOSIS — L738 Other specified follicular disorders: Secondary | ICD-10-CM | POA: Diagnosis not present

## 2015-03-05 DIAGNOSIS — L700 Acne vulgaris: Secondary | ICD-10-CM | POA: Diagnosis not present

## 2015-03-05 LAB — MYOCARDIAL PERFUSION IMAGING
LV dias vol: 84 mL
LV sys vol: 38 mL
Peak HR: 81 {beats}/min
Rest HR: 67 {beats}/min
SDS: 0
SRS: 1
SSS: 1
TID: 1.06

## 2015-03-05 MED ORDER — TECHNETIUM TC 99M SESTAMIBI GENERIC - CARDIOLITE
31.8000 | Freq: Once | INTRAVENOUS | Status: AC | PRN
  Administered 2015-03-05: 31.8 via INTRAVENOUS

## 2015-03-05 MED ORDER — AMINOPHYLLINE 25 MG/ML IV SOLN
75.0000 mg | Freq: Once | INTRAVENOUS | Status: AC
  Administered 2015-03-05: 75 mg via INTRAVENOUS

## 2015-03-05 MED ORDER — REGADENOSON 0.4 MG/5ML IV SOLN
0.4000 mg | Freq: Once | INTRAVENOUS | Status: AC
Start: 2015-03-05 — End: 2015-03-05
  Administered 2015-03-05: 0.4 mg via INTRAVENOUS

## 2015-03-05 MED ORDER — TECHNETIUM TC 99M SESTAMIBI GENERIC - CARDIOLITE
10.5000 | Freq: Once | INTRAVENOUS | Status: AC | PRN
  Administered 2015-03-05: 10.5 via INTRAVENOUS

## 2015-03-20 ENCOUNTER — Other Ambulatory Visit: Payer: Self-pay | Admitting: Family Medicine

## 2015-04-07 DIAGNOSIS — C73 Malignant neoplasm of thyroid gland: Secondary | ICD-10-CM | POA: Diagnosis not present

## 2015-04-07 DIAGNOSIS — E118 Type 2 diabetes mellitus with unspecified complications: Secondary | ICD-10-CM | POA: Diagnosis not present

## 2015-04-07 DIAGNOSIS — E890 Postprocedural hypothyroidism: Secondary | ICD-10-CM | POA: Diagnosis not present

## 2015-04-07 DIAGNOSIS — E782 Mixed hyperlipidemia: Secondary | ICD-10-CM | POA: Diagnosis not present

## 2015-04-09 ENCOUNTER — Ambulatory Visit: Payer: Self-pay | Admitting: Family Medicine

## 2015-04-10 ENCOUNTER — Other Ambulatory Visit: Payer: Self-pay | Admitting: Family Medicine

## 2015-04-14 DIAGNOSIS — E1159 Type 2 diabetes mellitus with other circulatory complications: Secondary | ICD-10-CM | POA: Diagnosis not present

## 2015-04-14 DIAGNOSIS — E669 Obesity, unspecified: Secondary | ICD-10-CM | POA: Diagnosis not present

## 2015-04-14 DIAGNOSIS — E039 Hypothyroidism, unspecified: Secondary | ICD-10-CM | POA: Diagnosis not present

## 2015-04-14 DIAGNOSIS — E785 Hyperlipidemia, unspecified: Secondary | ICD-10-CM | POA: Diagnosis not present

## 2015-04-14 DIAGNOSIS — C73 Malignant neoplasm of thyroid gland: Secondary | ICD-10-CM | POA: Diagnosis not present

## 2015-04-14 DIAGNOSIS — I10 Essential (primary) hypertension: Secondary | ICD-10-CM | POA: Diagnosis not present

## 2015-04-28 ENCOUNTER — Telehealth: Payer: Self-pay | Admitting: Family Medicine

## 2015-04-28 ENCOUNTER — Ambulatory Visit: Payer: Medicare Other | Admitting: "Endocrinology

## 2015-04-28 NOTE — Telephone Encounter (Signed)
Patient is complaining of sinus drainage, headache, in the mornings she cant hardly swallow due to mucos in her throat, please advise?

## 2015-04-29 ENCOUNTER — Telehealth: Payer: Self-pay | Admitting: Family Medicine

## 2015-04-29 MED ORDER — AZITHROMYCIN 250 MG PO TABS: ORAL_TABLET | ORAL | Status: DC

## 2015-04-29 NOTE — Telephone Encounter (Signed)
X 1 week,thick green sinus mucus, draining down her throat, aching over her eyes, gags her at night in her throat. Able to cough up small amounts of thick green phlegm. No fever chills or bodyaches. Has been using otc meds and loratadine. Wanted to make appt for your return but I told her she needed something before then, Please advse if something can be called in

## 2015-04-29 NOTE — Telephone Encounter (Signed)
Med sent and message left for patient  

## 2015-04-29 NOTE — Telephone Encounter (Signed)
z pack sent in, let her know pls also sched a wellness visit for her between Feb and Narch, none in 2 years and no appt on record for her , thanks!

## 2015-04-29 NOTE — Telephone Encounter (Signed)
Patient is complaining of headache, sinus drainage, weakness, please advise?

## 2015-05-15 ENCOUNTER — Other Ambulatory Visit: Payer: Self-pay | Admitting: Family Medicine

## 2015-05-22 ENCOUNTER — Encounter: Payer: Self-pay | Admitting: Family Medicine

## 2015-05-22 ENCOUNTER — Ambulatory Visit (INDEPENDENT_AMBULATORY_CARE_PROVIDER_SITE_OTHER): Payer: Medicare Other | Admitting: Family Medicine

## 2015-05-22 VITALS — BP 120/78 | HR 71 | Resp 16 | Ht 65.0 in | Wt 195.0 lb

## 2015-05-22 DIAGNOSIS — E1159 Type 2 diabetes mellitus with other circulatory complications: Secondary | ICD-10-CM

## 2015-05-22 DIAGNOSIS — I2510 Atherosclerotic heart disease of native coronary artery without angina pectoris: Secondary | ICD-10-CM

## 2015-05-22 DIAGNOSIS — Z1159 Encounter for screening for other viral diseases: Secondary | ICD-10-CM | POA: Diagnosis not present

## 2015-05-22 DIAGNOSIS — E890 Postprocedural hypothyroidism: Secondary | ICD-10-CM

## 2015-05-22 DIAGNOSIS — I10 Essential (primary) hypertension: Secondary | ICD-10-CM | POA: Diagnosis not present

## 2015-05-22 DIAGNOSIS — J329 Chronic sinusitis, unspecified: Secondary | ICD-10-CM | POA: Insufficient documentation

## 2015-05-22 DIAGNOSIS — G4459 Other complicated headache syndrome: Secondary | ICD-10-CM

## 2015-05-22 DIAGNOSIS — E66811 Obesity, class 1: Secondary | ICD-10-CM

## 2015-05-22 DIAGNOSIS — F418 Other specified anxiety disorders: Secondary | ICD-10-CM

## 2015-05-22 DIAGNOSIS — E785 Hyperlipidemia, unspecified: Secondary | ICD-10-CM | POA: Diagnosis not present

## 2015-05-22 DIAGNOSIS — F32A Depression, unspecified: Secondary | ICD-10-CM

## 2015-05-22 DIAGNOSIS — E669 Obesity, unspecified: Secondary | ICD-10-CM

## 2015-05-22 DIAGNOSIS — F329 Major depressive disorder, single episode, unspecified: Secondary | ICD-10-CM

## 2015-05-22 DIAGNOSIS — G5603 Carpal tunnel syndrome, bilateral upper limbs: Secondary | ICD-10-CM

## 2015-05-22 DIAGNOSIS — F419 Anxiety disorder, unspecified: Secondary | ICD-10-CM

## 2015-05-22 MED ORDER — FLUTICASONE PROPIONATE 50 MCG/ACT NA SUSP: 2.0000 | Freq: Every day | NASAL | Status: DC

## 2015-05-22 NOTE — Patient Instructions (Addendum)
Annual wellness in 4 month, call if you need me sooner  Commit to loratidine and flonase every day for allergies  Sinus X ray today please and submit sputum for culture as soon as possible  No antibiotics today  Flush nostrils/ sinuses with saline twice daily to help to clear mucus  Blood pressure is good , glad to hear that blood sugar is better  Recommend the injections as per the hand specialist for you hand pain, so call to get that done once you decide, will likely help A LOT!  Allergic Rhinitis Allergic rhinitis is when the mucous membranes in the nose respond to allergens. Allergens are particles in the air that cause your body to have an allergic reaction. This causes you to release allergic antibodies. Through a chain of events, these eventually cause you to release histamine into the blood stream. Although meant to protect the body, it is this release of histamine that causes your discomfort, such as frequent sneezing, congestion, and an itchy, runny nose.  CAUSES Seasonal allergic rhinitis (hay fever) is caused by pollen allergens that may come from grasses, trees, and weeds. Year-round allergic rhinitis (perennial allergic rhinitis) is caused by allergens such as house dust mites, pet dander, and mold spores. SYMPTOMS  Nasal stuffiness (congestion).  Itchy, runny nose with sneezing and tearing of the eyes. DIAGNOSIS Your health care provider can help you determine the allergen or allergens that trigger your symptoms. If you and your health care provider are unable to determine the allergen, skin or blood testing may be used. Your health care provider will diagnose your condition after taking your health history and performing a physical exam. Your health care provider may assess you for other related conditions, such as asthma, pink eye, or an ear infection. TREATMENT Allergic rhinitis does not have a cure, but it can be controlled by:  Medicines that block allergy symptoms.  These may include allergy shots, nasal sprays, and oral antihistamines.  Avoiding the allergen. Hay fever may often be treated with antihistamines in pill or nasal spray forms. Antihistamines block the effects of histamine. There are over-the-counter medicines that may help with nasal congestion and swelling around the eyes. Check with your health care provider before taking or giving this medicine. If avoiding the allergen or the medicine prescribed do not work, there are many new medicines your health care provider can prescribe. Stronger medicine may be used if initial measures are ineffective. Desensitizing injections can be used if medicine and avoidance does not work. Desensitization is when a patient is given ongoing shots until the body becomes less sensitive to the allergen. Make sure you follow up with your health care provider if problems continue. HOME CARE INSTRUCTIONS It is not possible to completely avoid allergens, but you can reduce your symptoms by taking steps to limit your exposure to them. It helps to know exactly what you are allergic to so that you can avoid your specific triggers. SEEK MEDICAL CARE IF:  You have a fever.  You develop a cough that does not stop easily (persistent).  You have shortness of breath.  You start wheezing.  Symptoms interfere with normal daily activities.   This information is not intended to replace advice given to you by your health care provider. Make sure you discuss any questions you have with your health care provider.   Document Released: 12/01/2000 Document Revised: 03/29/2014 Document Reviewed: 11/13/2012 Elsevier Interactive Patient Education [employer].

## 2015-05-22 NOTE — Progress Notes (Signed)
Subjective:    Patient ID: Jennifer Blackburn, female    DOB: 1945/12/16, 70 y.o.   MRN: [id number]  HPI   Jennifer Blackburn     MRN: [id number]      DOB: 02/19/1946   HPI Jennifer Blackburn is here for follow up and re-evaluation of chronic medical conditions, medication management and review of any available recent lab and radiology data.  Preventive health is updated, specifically  Cancer screening and Immunization.   Questions or concerns regarding consultations or procedures which the PT has had in the interim are  Addressed.Reports marked improvement in blood sugars now that she is seeing endo, and she reports testing daily and regular exercise The PT denies any adverse reactions to current medications since the last visit.  Frontal pressure with post nasal drainage for months, tickle in throat and cough C/o hand pain, ortho recommendation is injection or surgery, has not decided on either as yet   ROS Denies recent fever or chills.  Denies chest pains, palpitations and leg swelling Denies abdominal pain, nausea, vomiting,diarrhea or constipation.   Denies dysuria, frequency, hesitancy or incontinence.  Denies  seizures, numbness, or tingling. Denies depression, anxiety or insomnia. Denies skin break down or rash.   PE  BP 120/78 mmHg  Pulse 71  Resp 16  Ht 5\' 5"  (1.651 m)  Wt 195 lb (88.451 kg)  BMI 32.45 kg/m2  SpO2 96%  Patient alert and oriented and in no cardiopulmonary distress.  HEENT: No facial asymmetry, EOMI,   oropharynx pink and moist.  Neck supple no JVD, no mass. TM clear bilaterally, No inus tenderness Chest: Clear to auscultation bilaterally.  CVS: S1, S2 no murmurs, no S3.Regular rate.  ABD: Soft non tender.   Ext: No edema  MS: Adequate ROM spine, shoulders, hips and knees.  Skin: Intact, no ulcerations or rash noted.  Psych: Good eye contact, normal affect. Memory intact not anxious or depressed appearing.  CNS: CN 2-12 intact, power,  normal  throughout.no focal deficits noted.   Assessment & Plan   Chronic sinusitis Clinical impression is allergic sinusitis , untreated, educated and she is to take claritin and add daily flonase and saline nasal flushes DG sinus and sputum/ drainage for c/s  Type 2 diabetes mellitus with vascular disease ([place]) Uncontrolled, now being treated by endo, reports improvement, sottas she is now compliant, will look out for updated info, medication has also been changed , she does not have meds with her   Postsurgical hypothyroidism Managed by endo, controlled   Anxiety and depression Improved and stable on current medication  Headache Reports improved on the days she takes claritin, has uncontrolled allergic rhinitis , encouraged to use medication for this daily  Coronary atherosclerosis Asymptomatic and stable  Obesity (BMI 30.0-34.9) Unchanged. Patient re-educated about  the importance of commitment to a  minimum of 150 minutes of exercise per week.  The importance of healthy food choices with portion control discussed. Encouraged to start a food diary, count calories and to consider  joining a support group. Sample diet sheets offered. Goals set by the patient for the next several months.   Weight /BMI 05/22/2015 03/05/2015 02/19/2015  WEIGHT 195 lb 195 lb 195 lb  HEIGHT 5\' 5"  5\' 5"  5\' 5"   BMI 32.45 kg/m2 32.45 kg/m2 32.45 kg/m2    Current exercise per week 90 minutes.   Hyperlipidemia Hyperlipidemia:Low fat diet discussed and encouraged.   Lipid Panel  Lab Results  Component Value Date  CHOL 175 11/14/2014   HDL 42* 11/14/2014   LDLCALC 112 11/14/2014   TRIG 106 11/14/2014   CHOLHDL 4.2 11/14/2014   Uncontrolled, needs to change diet, no med change Updated lab needed at/ before next visit.     CARPAL TUNNEL SYNDROME, BILATERAL Encouraged strongly to consider either injection or surgery per recommendation of ortho due to pain       Review of Systems       Objective:   Physical Exam        Assessment & Plan:

## 2015-05-23 ENCOUNTER — Ambulatory Visit (HOSPITAL_COMMUNITY)
Admission: RE | Admit: 2015-05-23 | Discharge: 2015-05-23 | Disposition: A | Discharge status: Home / Self Care | Payer: Medicare Other | Source: Ambulatory Visit | Attending: Family Medicine | Admitting: Family Medicine

## 2015-05-23 DIAGNOSIS — J329 Chronic sinusitis, unspecified: Secondary | ICD-10-CM | POA: Diagnosis not present

## 2015-05-23 DIAGNOSIS — J3489 Other specified disorders of nose and nasal sinuses: Secondary | ICD-10-CM | POA: Diagnosis not present

## 2015-05-23 NOTE — Assessment & Plan Note (Addendum)
Unchanged. Patient re-educated about  the importance of commitment to a  minimum of 150 minutes of exercise per week.  The importance of healthy food choices with portion control discussed. Encouraged to start a food diary, count calories and to consider  joining a support group. Sample diet sheets offered. Goals set by the patient for the next several months.   Weight /BMI 05/22/2015 03/05/2015 02/19/2015  WEIGHT 195 lb 195 lb 195 lb  HEIGHT 5\' 5"  5\' 5"  5\' 5"   BMI 32.45 kg/m2 32.45 kg/m2 32.45 kg/m2    Current exercise per week 90 minutes.

## 2015-05-23 NOTE — Assessment & Plan Note (Signed)
Clinical impression is allergic sinusitis , untreated, educated and she is to take claritin and add daily flonase and saline nasal flushes DG sinus and sputum/ drainage for c/s

## 2015-05-23 NOTE — Assessment & Plan Note (Signed)
Improved and stable on current medication 

## 2015-05-23 NOTE — Assessment & Plan Note (Signed)
Uncontrolled, now being treated by endo, reports improvement, sottas she is now compliant, will look out for updated info, medication has also been changed , she does not have meds with her

## 2015-05-23 NOTE — Assessment & Plan Note (Signed)
Managed by endo, controlled 

## 2015-05-23 NOTE — Assessment & Plan Note (Signed)
Encouraged strongly to consider either injection or surgery per recommendation of ortho due to pain

## 2015-05-23 NOTE — Assessment & Plan Note (Signed)
Reports improved on the days she takes claritin, has uncontrolled allergic rhinitis , encouraged to use medication for this daily

## 2015-05-23 NOTE — Assessment & Plan Note (Signed)
Hyperlipidemia:Low fat diet discussed and encouraged.   Lipid Panel  Lab Results  Component Value Date   CHOL 175 11/14/2014   HDL 42* 11/14/2014   LDLCALC 112 11/14/2014   TRIG 106 11/14/2014   CHOLHDL 4.2 11/14/2014   Uncontrolled, needs to change diet, no med change Updated lab needed at/ before next visit.

## 2015-05-23 NOTE — Assessment & Plan Note (Signed)
Asymptomatic and stable 

## 2015-05-29 ENCOUNTER — Other Ambulatory Visit: Payer: Self-pay | Admitting: Family Medicine

## 2015-05-30 ENCOUNTER — Encounter: Payer: Self-pay | Admitting: *Deleted

## 2015-06-05 DIAGNOSIS — C73 Malignant neoplasm of thyroid gland: Secondary | ICD-10-CM | POA: Diagnosis not present

## 2015-06-05 DIAGNOSIS — E784 Other hyperlipidemia: Secondary | ICD-10-CM | POA: Diagnosis not present

## 2015-06-05 DIAGNOSIS — E1165 Type 2 diabetes mellitus with hyperglycemia: Secondary | ICD-10-CM | POA: Diagnosis not present

## 2015-06-06 LAB — LIPID PANEL: LDL Cholesterol: 97 mg/dL

## 2015-06-12 DIAGNOSIS — E669 Obesity, unspecified: Secondary | ICD-10-CM | POA: Diagnosis not present

## 2015-06-12 DIAGNOSIS — E039 Hypothyroidism, unspecified: Secondary | ICD-10-CM | POA: Diagnosis not present

## 2015-06-12 DIAGNOSIS — E1159 Type 2 diabetes mellitus with other circulatory complications: Secondary | ICD-10-CM | POA: Diagnosis not present

## 2015-06-12 DIAGNOSIS — E785 Hyperlipidemia, unspecified: Secondary | ICD-10-CM | POA: Diagnosis not present

## 2015-06-12 DIAGNOSIS — C73 Malignant neoplasm of thyroid gland: Secondary | ICD-10-CM | POA: Diagnosis not present

## 2015-06-12 DIAGNOSIS — E1165 Type 2 diabetes mellitus with hyperglycemia: Secondary | ICD-10-CM | POA: Diagnosis not present

## 2015-06-12 DIAGNOSIS — I10 Essential (primary) hypertension: Secondary | ICD-10-CM | POA: Diagnosis not present

## 2015-06-13 ENCOUNTER — Other Ambulatory Visit: Payer: Self-pay | Admitting: Cardiovascular Disease

## 2015-06-13 NOTE — Telephone Encounter (Signed)
Rx request sent to pharmacy.  

## 2015-06-16 ENCOUNTER — Other Ambulatory Visit: Payer: Self-pay | Admitting: Family Medicine

## 2015-06-20 ENCOUNTER — Other Ambulatory Visit: Payer: Self-pay

## 2015-06-20 MED ORDER — PRAVASTATIN SODIUM 80 MG PO TABS: 80.0000 mg | ORAL_TABLET | Freq: Every day | ORAL | Status: DC

## 2015-06-24 ENCOUNTER — Other Ambulatory Visit: Payer: Self-pay

## 2015-06-24 MED ORDER — METFORMIN HCL 1000 MG PO TABS: 1000.0000 mg | ORAL_TABLET | Freq: Two times a day (BID) | ORAL | Status: DC

## 2015-08-11 DIAGNOSIS — M65342 Trigger finger, left ring finger: Secondary | ICD-10-CM | POA: Diagnosis not present

## 2015-08-11 DIAGNOSIS — M79642 Pain in left hand: Secondary | ICD-10-CM | POA: Diagnosis not present

## 2015-08-20 DIAGNOSIS — M79642 Pain in left hand: Secondary | ICD-10-CM | POA: Diagnosis not present

## 2015-08-20 DIAGNOSIS — M65342 Trigger finger, left ring finger: Secondary | ICD-10-CM | POA: Diagnosis not present

## 2015-09-08 DIAGNOSIS — E1165 Type 2 diabetes mellitus with hyperglycemia: Secondary | ICD-10-CM | POA: Diagnosis not present

## 2015-09-08 DIAGNOSIS — C73 Malignant neoplasm of thyroid gland: Secondary | ICD-10-CM | POA: Diagnosis not present

## 2015-09-08 DIAGNOSIS — E784 Other hyperlipidemia: Secondary | ICD-10-CM | POA: Diagnosis not present

## 2015-09-10 DIAGNOSIS — E1165 Type 2 diabetes mellitus with hyperglycemia: Secondary | ICD-10-CM | POA: Diagnosis not present

## 2015-09-10 DIAGNOSIS — C73 Malignant neoplasm of thyroid gland: Secondary | ICD-10-CM | POA: Diagnosis not present

## 2015-09-10 DIAGNOSIS — I10 Essential (primary) hypertension: Secondary | ICD-10-CM | POA: Diagnosis not present

## 2015-09-10 DIAGNOSIS — E039 Hypothyroidism, unspecified: Secondary | ICD-10-CM | POA: Diagnosis not present

## 2015-09-10 DIAGNOSIS — E669 Obesity, unspecified: Secondary | ICD-10-CM | POA: Diagnosis not present

## 2015-09-10 DIAGNOSIS — E1159 Type 2 diabetes mellitus with other circulatory complications: Secondary | ICD-10-CM | POA: Diagnosis not present

## 2015-09-10 DIAGNOSIS — E785 Hyperlipidemia, unspecified: Secondary | ICD-10-CM | POA: Diagnosis not present

## 2015-09-17 DIAGNOSIS — M65342 Trigger finger, left ring finger: Secondary | ICD-10-CM | POA: Diagnosis not present

## 2015-10-04 ENCOUNTER — Other Ambulatory Visit: Payer: Self-pay | Admitting: Family Medicine

## 2015-10-08 DIAGNOSIS — R202 Paresthesia of skin: Secondary | ICD-10-CM | POA: Diagnosis not present

## 2015-10-08 DIAGNOSIS — Z888 Allergy status to other drugs, medicaments and biological substances status: Secondary | ICD-10-CM | POA: Diagnosis not present

## 2015-10-08 DIAGNOSIS — Z794 Long term (current) use of insulin: Secondary | ICD-10-CM | POA: Diagnosis not present

## 2015-10-08 DIAGNOSIS — E119 Type 2 diabetes mellitus without complications: Secondary | ICD-10-CM | POA: Diagnosis not present

## 2015-10-08 DIAGNOSIS — R200 Anesthesia of skin: Secondary | ICD-10-CM | POA: Diagnosis not present

## 2015-10-08 DIAGNOSIS — Z79899 Other long term (current) drug therapy: Secondary | ICD-10-CM | POA: Diagnosis not present

## 2015-10-08 DIAGNOSIS — R209 Unspecified disturbances of skin sensation: Secondary | ICD-10-CM | POA: Diagnosis not present

## 2015-10-08 DIAGNOSIS — E7800 Pure hypercholesterolemia, unspecified: Secondary | ICD-10-CM | POA: Diagnosis not present

## 2015-10-08 DIAGNOSIS — Z85850 Personal history of malignant neoplasm of thyroid: Secondary | ICD-10-CM | POA: Diagnosis not present

## 2015-10-08 DIAGNOSIS — E890 Postprocedural hypothyroidism: Secondary | ICD-10-CM | POA: Diagnosis not present

## 2015-10-08 DIAGNOSIS — R201 Hypoesthesia of skin: Secondary | ICD-10-CM | POA: Diagnosis not present

## 2015-10-08 DIAGNOSIS — I10 Essential (primary) hypertension: Secondary | ICD-10-CM | POA: Diagnosis not present

## 2015-10-08 DIAGNOSIS — Z882 Allergy status to sulfonamides status: Secondary | ICD-10-CM | POA: Diagnosis not present

## 2015-10-15 ENCOUNTER — Ambulatory Visit (INDEPENDENT_AMBULATORY_CARE_PROVIDER_SITE_OTHER): Payer: Medicare Other | Admitting: Family Medicine

## 2015-10-15 VITALS — BP 120/78 | HR 84 | Resp 16 | Ht 65.0 in | Wt 195.0 lb

## 2015-10-15 DIAGNOSIS — I2510 Atherosclerotic heart disease of native coronary artery without angina pectoris: Secondary | ICD-10-CM

## 2015-10-15 DIAGNOSIS — E1159 Type 2 diabetes mellitus with other circulatory complications: Secondary | ICD-10-CM | POA: Diagnosis not present

## 2015-10-15 DIAGNOSIS — R208 Other disturbances of skin sensation: Secondary | ICD-10-CM | POA: Diagnosis not present

## 2015-10-15 DIAGNOSIS — R200 Anesthesia of skin: Secondary | ICD-10-CM | POA: Insufficient documentation

## 2015-10-15 DIAGNOSIS — F32A Depression, unspecified: Secondary | ICD-10-CM

## 2015-10-15 DIAGNOSIS — I10 Essential (primary) hypertension: Secondary | ICD-10-CM

## 2015-10-15 DIAGNOSIS — E890 Postprocedural hypothyroidism: Secondary | ICD-10-CM

## 2015-10-15 DIAGNOSIS — F329 Major depressive disorder, single episode, unspecified: Secondary | ICD-10-CM

## 2015-10-15 DIAGNOSIS — F418 Other specified anxiety disorders: Secondary | ICD-10-CM

## 2015-10-15 DIAGNOSIS — R0989 Other specified symptoms and signs involving the circulatory and respiratory systems: Secondary | ICD-10-CM

## 2015-10-15 DIAGNOSIS — E669 Obesity, unspecified: Secondary | ICD-10-CM

## 2015-10-15 DIAGNOSIS — F419 Anxiety disorder, unspecified: Secondary | ICD-10-CM

## 2015-10-15 NOTE — Patient Instructions (Signed)
Annual wellness in September, call if you need me sooner  You are referred to neurology for evaluation of rigth facial numbness for 1 week, [facility] will call with appt  Pls sign for labs from dr Carmela Rima so I v can let you know about the gemfibrazol  Pls sched mammogram  Pls start daily claritin, if worsening with fever and chills  In next week call I will prescribe antibiotic  Foot exam shows reduced sensation in heels, examine regularly , and care the calluses

## 2015-10-15 NOTE — Progress Notes (Signed)
Jennifer Blackburn     MRN: [id number]      DOB: 06-20-1945   HPI Jennifer Blackburn is here for follow up of recent urgent care and ED visits, for acute onset of  right sided facial numbness, and abnormal sensation, no weakness,  now still having abnormal sensation involving the tongue Seen in [facility], MRI brain negative for hemorrhage or ischemia 1 week h/o periorbital swelling , and increased post nasal drainage , which is yellow and thick, no fever or chills    ROS Denies recent fever or chills. Denies sinus pressure, nasal congestion, ear pain or sore throat. Denies chest congestion, productive cough or wheezing. Denies chest pains, palpitations and leg swelling Denies abdominal pain, nausea, vomiting,diarrhea or constipation.   Denies dysuria, frequency, hesitancy or incontinence. Denies joint pain, swelling and limitation in mobility. Denies headaches, seizures, numbness, or tingling. Denies depression, anxiety or insomnia. Denies skin break down or rash.  PE BP: 120/78 , pulse: 84, resp: 16 Patient alert and oriented and in no cardiopulmonary distress.  HEENT: No facial asymmetry, EOMI,   oropharynx pink and moist.  Neck supple no JVD, no mass. bruit  Chest: Clear to auscultation bilaterally.  CVS: S1, S2 no murmurs, no S3.Regular rate.  ABD: Soft non tender.   Ext: No edema  MS: Adequate ROM spine, shoulders, hips and knees.  Skin: Intact, no ulcerations or rash noted.  Psych: Good eye contact, normal affect. Memory intact not anxious or depressed appearing.  CNS: CN 2-12 intact, power,  normal throughout.no focal deficits noted.   Assessment & Plan  Right facial numbness 1 week h/o abnormal sensation on right side of face, high risk of vascular disease based on co morbidities, MRI brain done at recent ED visit negative for acute insult, ongoing symptoms , refer to neurology, risk modification and optimal management / control of chronic conditions needed  ESSENTIAL  HYPERTENSION, BENIGN Controlled, no change in medication   Type 2 diabetes mellitus with vascular disease ([facility]) Uncontrolled, mnaged by [name] is reminded of the importance of commitment to daily physical activity for 30 minutes or more, as able and the need to limit carbohydrate intake to 30 to 60 grams per meal to help with blood sugar control.   The need to take medication as prescribed, test blood sugar as directed, and to call between visits if there is a concern that blood sugar is uncontrolled is also discussed.   Jennifer Blackburn is reminded of the importance of daily foot exam, annual eye examination, and good blood sugar, blood pressure and cholesterol control.  Diabetic Labs Latest Ref Rng & Units 11/14/2014 07/10/2014 12/28/2013 07/30/2013 05/02/2013  HbA1c <5.7 % 9.0(H) 9.0(H) 9.2(H) 8.0(H) -  Microalbumin <2.0 mg/dL - 4.7(H) - - 2.37(H)  Micro/Creat Ratio 0.0 - 30.0 mg/g - 20.8 - - 10.4  Chol 125 - 200 mg/dL 175 182 204(H) - -  HDL >=46 mg/dL 42(L) 44(L) 46 - -  Calc LDL <130 mg/dL 112 105(H) 132(H) - -  Triglycerides <150 mg/dL 106 167(H) 130 - -  Creatinine 0.50 - 0.99 mg/dL 0.73 0.73 0.71 0.60 -   BP/Weight 10/15/2015 05/22/2015 03/05/2015 02/19/2015 02/11/2015 01/23/2015 [id number]  Systolic BP 123456 123456 - [id number] - [id number] 123456  Diastolic BP 78 78 - 74 - 72 84  Wt. (Lbs) 195 195 195 195 195 196.2 195  BMI 32.45 32.45 32.45 32.45 32.45 32.65 32.45   Foot/eye exam completion dates Latest Ref Rng & Units 10/15/2015 10/23/2014  Eye Exam No Retinopathy - No Retinopathy  Foot exam Order - - -  Foot Form Completion - Done -        Postsurgical hypothyroidism Managed by endo  Anxiety and depression Controlled, no change in medication   Obesity (BMI 30.0-34.9) Unchanged Patient re-educated about  the importance of commitment to a  minimum of 150 minutes of exercise per week.  The importance of healthy food choices with portion control discussed. Encouraged to start a food  diary, count calories and to consider  joining a support group. Sample diet sheets offered. Goals set by the patient for the next several months.   Weight /BMI 10/15/2015 05/22/2015 03/05/2015  WEIGHT 195 lb 195 lb 195 lb  HEIGHT 5\' 5"  5\' 5"  5\' 5"   BMI 32.45 kg/m2 32.45 kg/m2 32.45 kg/m2      Carotid bruit present 1 week h/o persistent rigth facial numbness, already has vascular disease, needs [place]

## 2015-10-16 ENCOUNTER — Encounter: Payer: Self-pay | Admitting: Family Medicine

## 2015-10-17 ENCOUNTER — Encounter: Payer: Self-pay | Admitting: Family Medicine

## 2015-10-17 NOTE — Assessment & Plan Note (Signed)
Managed by endo

## 2015-10-17 NOTE — Assessment & Plan Note (Signed)
1 week h/o abnormal sensation on right side of face, high risk of vascular disease based on co morbidities, MRI brain done at recent ED visit negative for acute insult, ongoing symptoms , refer to neurology, risk modification and optimal management / control of chronic conditions needed

## 2015-10-17 NOTE — Assessment & Plan Note (Signed)
Controlled, no change in medication  

## 2015-10-17 NOTE — Assessment & Plan Note (Signed)
Uncontrolled, mnaged by [name] is reminded of the importance of commitment to daily physical activity for 30 minutes or more, as able and the need to limit carbohydrate intake to 30 to 60 grams per meal to help with blood sugar control.   The need to take medication as prescribed, test blood sugar as directed, and to call between visits if there is a concern that blood sugar is uncontrolled is also discussed.   Jennifer Blackburn is reminded of the importance of daily foot exam, annual eye examination, and good blood sugar, blood pressure and cholesterol control.  Diabetic Labs Latest Ref Rng & Units 11/14/2014 07/10/2014 12/28/2013 07/30/2013 05/02/2013  HbA1c <5.7 % 9.0(H) 9.0(H) 9.2(H) 8.0(H) -  Microalbumin <2.0 mg/dL - 4.7(H) - - 2.37(H)  Micro/Creat Ratio 0.0 - 30.0 mg/g - 20.8 - - 10.4  Chol 125 - 200 mg/dL 175 182 204(H) - -  HDL >=46 mg/dL 42(L) 44(L) 46 - -  Calc LDL <130 mg/dL 112 105(H) 132(H) - -  Triglycerides <150 mg/dL 106 167(H) 130 - -  Creatinine 0.50 - 0.99 mg/dL 0.73 0.73 0.71 0.60 -   BP/Weight 10/15/2015 05/22/2015 03/05/2015 02/19/2015 02/11/2015 01/23/2015 [id number]  Systolic BP 123456 123456 - [id number] - [id number] 123456  Diastolic BP 78 78 - 74 - 72 84  Wt. (Lbs) 195 195 195 195 195 196.2 195  BMI 32.45 32.45 32.45 32.45 32.45 32.65 32.45   Foot/eye exam completion dates Latest Ref Rng & Units 10/15/2015 10/23/2014  Eye Exam No Retinopathy - No Retinopathy  Foot exam Order - - -  Foot Form Completion - Done -

## 2015-10-17 NOTE — Assessment & Plan Note (Signed)
1 week h/o persistent rigth facial numbness, already has vascular disease, needs [place]

## 2015-10-17 NOTE — Assessment & Plan Note (Signed)
Unchanged Patient re-educated about  the importance of commitment to a  minimum of 150 minutes of exercise per week.  The importance of healthy food choices with portion control discussed. Encouraged to start a food diary, count calories and to consider  joining a support group. Sample diet sheets offered. Goals set by the patient for the next several months.   Weight /BMI 10/15/2015 05/22/2015 03/05/2015  WEIGHT 195 lb 195 lb 195 lb  HEIGHT 5\' 5"  5\' 5"  5\' 5"   BMI 32.45 kg/m2 32.45 kg/m2 32.45 kg/m2

## 2015-10-20 LAB — LIPID PANEL: LDL Cholesterol: 132 mg/dL

## 2015-10-20 LAB — HEMOGLOBIN A1C: Hemoglobin A1C: 8

## 2015-10-27 ENCOUNTER — Encounter: Payer: Self-pay | Admitting: Diagnostic Neuroimaging

## 2015-10-27 ENCOUNTER — Ambulatory Visit (INDEPENDENT_AMBULATORY_CARE_PROVIDER_SITE_OTHER): Payer: Medicare Other | Admitting: Diagnostic Neuroimaging

## 2015-10-27 VITALS — BP 126/65 | HR 66 | Ht 65.0 in | Wt 196.6 lb

## 2015-10-27 DIAGNOSIS — I10 Essential (primary) hypertension: Secondary | ICD-10-CM | POA: Diagnosis not present

## 2015-10-27 DIAGNOSIS — R208 Other disturbances of skin sensation: Secondary | ICD-10-CM

## 2015-10-27 DIAGNOSIS — G458 Other transient cerebral ischemic attacks and related syndromes: Secondary | ICD-10-CM | POA: Diagnosis not present

## 2015-10-27 DIAGNOSIS — E669 Obesity, unspecified: Secondary | ICD-10-CM

## 2015-10-27 DIAGNOSIS — R200 Anesthesia of skin: Secondary | ICD-10-CM

## 2015-10-27 DIAGNOSIS — E66811 Obesity, class 1: Secondary | ICD-10-CM

## 2015-10-27 DIAGNOSIS — I2510 Atherosclerotic heart disease of native coronary artery without angina pectoris: Secondary | ICD-10-CM

## 2015-10-27 DIAGNOSIS — E1159 Type 2 diabetes mellitus with other circulatory complications: Secondary | ICD-10-CM | POA: Diagnosis not present

## 2015-10-27 MED ORDER — ASPIRIN EC 81 MG PO TBEC: 81.0000 mg | DELAYED_RELEASE_TABLET | Freq: Every day | ORAL | Status: DC

## 2015-10-27 NOTE — Patient Instructions (Addendum)
Thank you for coming to see [place] at [facility]. I hope we have been able to provide you high quality care today.  You may receive a patient satisfaction survey over the next few weeks. We would appreciate your feedback and comments so that we may continue to improve ourselves and the health of our patients.  - start aspirin 59m daily - check carotid ultrasound (already ordered per PCP) and TTE (echo) - continue statin and diabetes medications - check sleep study to rule out sleep apnea   ~~~~~~~~~~~~~~~~~~~~~~~~~~~~~~~~~~~~~~~~~~~~~~~~~~~~~~~~~~~~~~~~~  DR. Katie Faraone'S GUIDE TO HAPPY AND HEALTHY LIVING These are some of my general health and wellness recommendations. Some of them may apply to you better than others. Please use common sense as you try these suggestions and feel free to ask me any questions.   ACTIVITY/FITNESS Mental, social, emotional and physical stimulation are very important for brain and body health. Try learning a new activity (arts, music, language, sports, games).  Keep moving your body to the best of your abilities. You can do this at home, inside or outside, the park, community center, gym or anywhere you like. Consider a physical therapist or personal trainer to get started. Consider the app Sworkit. Fitness trackers such as smart-watches, smart-phones or Fitbits can help as well.   NUTRITION Eat more plants: colorful vegetables, nuts, seeds and berries.  Eat less sugar, salt, preservatives and processed foods.  Avoid toxins such as cigarettes and alcohol.  Drink water when you are thirsty. Warm water with a slice of lemon is an excellent morning drink to start the day.  Consider these websites for more information The Nutrition Source (hhttps://www.henry-hernandez.biz/ Precision Nutrition (wWindowBlog.ch   RELAXATION Consider practicing mindfulness meditation or other relaxation techniques  such as deep breathing, prayer, yoga, tai chi, massage. See website mindful.org or the apps Headspace or Calm to help get started.   SLEEP Try to get at least 7-8+ hours sleep per day. Regular exercise and reduced caffeine will help you sleep better. Practice good sleep hygeine techniques. See website sleep.org for more information.   PLANNING Prepare estate planning, living will, healthcare POA documents. Sometimes this is best planned with the help of an attorney. Theconversationproject.org and agingwithdignity.org are excellent resources.

## 2015-10-27 NOTE — Progress Notes (Signed)
[facility]  PATIENT: Jennifer Blackburn DOB: 03/09/1946  REFERRING CLINICIAN: Moshe Cipro HISTORY FROM: patient  REASON FOR VISIT: new consult    HISTORICAL  CHIEF COMPLAINT:  Chief Complaint  Patient presents with  . Numbness    rm 7, New Pt, "right facial numbness, sudden onset 3 weeks ago, tongue and throat also; lasted 1-2 weeks, was intermittent; Urgent Care ruled out stroke and anything in my brain"    HISTORY OF PRESENT ILLNESS:   70 year old right-handed female with history of diabetes, hypothyroidism, hypertension, hypercholesterolemia, here for evaluation of right face, throat, tongue and hand numbness.  10/07/15 patient noticed sudden onset right face numbness lasting 2-3 minutes at a time intermittently. This went on for 1-2 weeks. July 19 patient also noticed right face and right hand numbness.  Patient apparently went to urgent care or emergency room had CT scan and MRI scan as of the brain for the symptoms which ruled out acute stroke. Patient presented here for further evaluation and treatment.  Patient previously was taking aspirin on a daily basis. This was stopped 2-3 years ago.   REVIEW OF SYSTEMS: Full 14 system review of systems performed and negative with exception of: Fatigue blurred vision numbness.  ALLERGIES: Allergies  Allergen Reactions  . Nsaids Hives  . Sulfonamide Derivatives Hives    HOME MEDICATIONS: Outpatient Medications Prior to Visit  Medication Sig Dispense Refill  . CINNAMON PO Take by mouth.    . gabapentin (NEURONTIN) 100 MG capsule Take 1 capsule (100 mg total) by mouth 3 (three) times daily. 90 capsule 3  . glucose blood (ACCU-CHEK AVIVA PLUS) test strip Use as instructed four times daily dx E11.65 150 each 5  . imipramine (TOFRANIL) 25 MG tablet TAKE 4 TABLETS BY MOUTH EVERY NIGHT AT BEDTIME 120 tablet 1  . Insulin Glargine (TOUJEO SOLOSTAR) 300 UNIT/ML SOPN Inject 60 Units into the skin at bedtime.    .  Insulin Pen Needle (B-D ULTRAFINE III SHORT PEN) 31G X 8 MM MISC Use to inject insulin daily 50 each 1  . Lancets (ACCU-CHEK MULTICLIX) lancets Use as instructed three times daily dx 250.01 100 each 5  . levothyroxine (SYNTHROID) 200 MCG tablet Take 1 tablet (200 mcg total) by mouth daily before breakfast. 30 tablet 5  . linagliptin (TRADJENTA) 5 MG TABS tablet Take 5 mg by mouth daily.    Marland Kitchen loratadine (CLARITIN) 10 MG tablet Take 10 mg by mouth daily.    . metFORMIN (GLUCOPHAGE) 1000 MG tablet Take 1 tablet (1,000 mg total) by mouth 2 (two) times daily. 180 tablet 0  . metoprolol (LOPRESSOR) 50 MG tablet TAKE 1 TABLET BY MOUTH TWICE DAILY 60 tablet 8  . PARoxetine (PAXIL) 20 MG tablet TAKE 1 TABLET BY MOUTH EVERY DAY 30 tablet 2  . pravastatin (PRAVACHOL) 80 MG tablet Take 1 tablet (80 mg total) by mouth daily. 90 tablet 0  . quinapril (ACCUPRIL) 10 MG tablet TAKE 1 TABLET BY MOUTH DAILY 30 tablet 3  . verapamil (VERELAN PM) 180 MG 24 hr capsule TAKE 1 CAPSULE BY MOUTH EVERY DAY 90 capsule 3  . vitamin B-12 (CYANOCOBALAMIN) 1000 MCG tablet Take 1 tablet (1,000 mcg total) by mouth daily. 30 tablet 5  . Vitamin D, Ergocalciferol, (DRISDOL) 50000 units CAPS capsule TAKE 1 CAPSULE BY MOUTH EVERY 7 DAYS 4 capsule 6  . fluticasone (FLONASE) 50 MCG/ACT nasal spray Place 2 sprays into both nostrils daily. 16 g 2  . Menthol-Methyl Salicylate (MUSCLE RUB) 10-15 %  CREA Apply 1 application topically as needed for muscle pain.    Marland Kitchen tretinoin (RETIN-A) 0.05 % cream Apply 1 application topically every other day.     No facility-administered medications prior to visit.     PAST MEDICAL HISTORY: Past Medical History:  Diagnosis Date  . Anxiety   . CAD (coronary artery disease)   . Depression   . Diabetes mellitus type II    without complication  . DJD (degenerative joint disease) of lumbar spine   . Hypercholesteremia   . Hyperlipidemia   . Hypertension    benign   . Thyroid cancer ([place]) 2001     PAST SURGICAL HISTORY: Past Surgical History:  Procedure Laterality Date  . ABDOMINAL HYSTERECTOMY    . CARDIAC CATHETERIZATION    . CATARACT EXTRACTION W/PHACO Right 07/17/2012   Procedure: CATARACT EXTRACTION PHACO AND INTRAOCULAR LENS PLACEMENT (IOC);  Surgeon: Tonny Branch, MD;  Location: [facility]S;  Service: Ophthalmology;  Laterality: Right;  CDE:25.51  . COLONOSCOPY    . COLONOSCOPY N/A 01/15/2014   Procedure: COLONOSCOPY;  Surgeon: Daneil Dolin, MD;  Location: [facility] SUITE;  Service: Endoscopy;  Laterality: N/A;  9:00 AM  . DOPPLER ECHOCARDIOGRAPHY    . JOINT REPLACEMENT  07/01/2010   left hip  . KNEE SURGERY Right    arthroscopy  . left hip replaced  07/01/2010  . SPINE SURGERY  2006   cervical  . stress dipyridamole myocardial perfusion    . THYROIDECTOMY    . TONSILLECTOMY    . VESICOVAGINAL FISTULA CLOSURE W/ TAH      FAMILY HISTORY: Family History  Problem Relation Age of Onset  . Heart attack Father   . Heart failure Mother   . Colon cancer Neg Hx     SOCIAL HISTORY:  Social History   Social History  . Marital status: Married    Spouse name: N/A  . Number of children: 2  . Years of education: 14   Occupational History  . retired      Merchandiser, retail   Social History Main Topics  . Smoking status: Passive Smoke Exposure - Never Smoker  . Smokeless tobacco: Never Used  . Alcohol use No  . Drug use: No  . Sexual activity: Not on file   Other Topics Concern  . Not on file   Social History Narrative   Lives with husband, at home   No caffeine     PHYSICAL EXAM  GENERAL EXAM/CONSTITUTIONAL: Vitals:  Vitals:   10/27/15 1132  BP: 126/65  Pulse: 66  Weight: 196 lb 9.6 oz (89.2 kg)  Height: 5\' 5"  (1.651 m)     Body mass index is 32.72 kg/m.  Visual Acuity Screening   Right eye Left eye Both eyes  Without correction: 20/50 20/70   With correction:        Patient is in no distress; well developed, nourished and groomed; neck is  supple  CARDIOVASCULAR:  Examination of carotid arteries is normal; no carotid bruits  Regular rate and rhythm, no murmurs  Examination of peripheral vascular system by observation and palpation is normal  EYES:  Ophthalmoscopic exam of optic discs and posterior segments is normal; no papilledema or hemorrhages  MUSCULOSKELETAL:  Gait, strength, tone, movements noted in Neurologic exam below  NEUROLOGIC: MENTAL STATUS:  No flowsheet data found.  awake, alert, oriented to person, place and time  recent and remote memory intact  normal attention and concentration  language fluent, comprehension intact, naming intact,  fund of knowledge appropriate  CRANIAL NERVE:   2nd - no papilledema on fundoscopic exam  2nd, 3rd, 4th, 6th - pupils equal and reactive to light, visual fields full to confrontation, extraocular muscles intact, no nystagmus  5th - facial sensation symmetric  7th - facial strength symmetric  8th - hearing intact  9th - palate elevates symmetrically, uvula midline  11th - shoulder shrug symmetric  12th - tongue protrusion midline  MOTOR:   normal bulk and tone, full strength in the BUE, BLE  SENSORY:   normal and symmetric to light touch, temperature, vibration  COORDINATION:   finger-nose-finger, fine finger movements normal  REFLEXES:   deep tendon reflexes present and symmetric  GAIT/STATION:   narrow based gait; able to walk tandem; romberg is negative    DIAGNOSTIC DATA (LABS, IMAGING, TESTING) - I reviewed patient records, labs, notes, testing and imaging myself where available.  Lab Results  Component Value Date   WBC 6.1 08/20/2014   HGB 11.6 (L) 08/20/2014   HCT 35.9 (L) 08/20/2014   MCV 83.9 08/20/2014   PLT 254 08/20/2014      Component Value Date/Time   NA 141 11/14/2014 0836   K 4.1 11/14/2014 0836   CL 100 11/14/2014 0836   CO2 31 11/14/2014 0836   GLUCOSE 172 (H) 11/14/2014 0836   BUN 9 11/14/2014  0836   CREATININE 0.73 11/14/2014 0836   CALCIUM 8.9 11/14/2014 0836   PROT 7.2 11/14/2014 0836   ALBUMIN 4.1 11/14/2014 0836   AST 22 11/14/2014 0836   ALT 21 11/14/2014 0836   ALKPHOS 36 11/14/2014 0836   BILITOT 0.3 11/14/2014 0836   GFRNONAA 84 11/14/2014 0836   GFRAA >89 11/14/2014 0836   Lab Results  Component Value Date   CHOL 175 11/14/2014   HDL 42 (L) 11/14/2014   LDLCALC 132 10/20/2015   TRIG 106 11/14/2014   CHOLHDL 4.2 11/14/2014   Lab Results  Component Value Date   HGBA1C 8.0 10/20/2015   Lab Results  Component Value Date   VITAMINB12 183 (L) 08/20/2014   Lab Results  Component Value Date   TSH 0.070 (L) 10/13/2010    10/08/15 EKG - normal sinus rhythm  10/08/15 MRI brain [report only] - No acute intracranial pathology identified, specifically no acute infarct. - Mild to moderate chronic small vessel ischemic change. - Chronic sphenoid sinus disease.     ASSESSMENT AND PLAN  70 y.o. year old female here with intermittent right face and arm numbness and tingling in July 2017, likely representing transient ischemic attacks. Symptoms localize to left parietal lobe versus left brain subcortical (posterior limb of internal capsule). We'll complete workup and recommend medical management for secondary stroke prevention.   Dx: TIA (left brain)  1. Other specified transient cerebral ischemias      PLAN: - start aspirin 81mg  daily - check carotid u/s (already ordered per PCP) and TTE (echo) - continue statin and diabetes medications - check sleep study to rule out sleep apnea - TIA/stroke warning signs and education reviewed  Orders Placed This Encounter  Procedures  . Ambulatory referral to Sleep Studies  . ECHOCARDIOGRAM COMPLETE   Meds ordered this encounter  Medications  . aspirin EC 81 MG tablet    Sig: Take 1 tablet (81 mg total) by mouth daily.   Return in about 6 weeks (around 12/08/2015).  I reviewed labs, notes, records myself. I  summarized findings and reviewed with patient, for this high risk condition (TIA) requiring  high complexity decision making.    Penni Bombard, MD [id number], [id number] PM Certified in Neurology, Neurophysiology and Neuroimaging  [facility] [address] [place], [place] [address] [phone]

## 2015-10-28 ENCOUNTER — Other Ambulatory Visit: Payer: Self-pay | Admitting: Family Medicine

## 2015-10-29 ENCOUNTER — Ambulatory Visit (HOSPITAL_COMMUNITY)
Admission: RE | Admit: 2015-10-29 | Discharge: 2015-10-29 | Disposition: A | Discharge status: Home / Self Care | Payer: Medicare Other | Source: Ambulatory Visit | Attending: Family Medicine | Admitting: Family Medicine

## 2015-10-29 DIAGNOSIS — R0989 Other specified symptoms and signs involving the circulatory and respiratory systems: Secondary | ICD-10-CM | POA: Insufficient documentation

## 2015-10-29 DIAGNOSIS — R208 Other disturbances of skin sensation: Secondary | ICD-10-CM | POA: Diagnosis not present

## 2015-10-29 DIAGNOSIS — I6523 Occlusion and stenosis of bilateral carotid arteries: Secondary | ICD-10-CM | POA: Insufficient documentation

## 2015-10-29 DIAGNOSIS — R200 Anesthesia of skin: Secondary | ICD-10-CM

## 2015-11-11 ENCOUNTER — Encounter: Payer: Self-pay | Admitting: Neurology

## 2015-11-11 ENCOUNTER — Ambulatory Visit (INDEPENDENT_AMBULATORY_CARE_PROVIDER_SITE_OTHER): Payer: Medicare Other | Admitting: Neurology

## 2015-11-11 VITALS — BP 124/60 | HR 72 | Resp 16 | Ht 65.0 in | Wt 194.0 lb

## 2015-11-11 DIAGNOSIS — R51 Headache: Secondary | ICD-10-CM | POA: Diagnosis not present

## 2015-11-11 DIAGNOSIS — I2510 Atherosclerotic heart disease of native coronary artery without angina pectoris: Secondary | ICD-10-CM | POA: Diagnosis not present

## 2015-11-11 DIAGNOSIS — R0683 Snoring: Secondary | ICD-10-CM

## 2015-11-11 DIAGNOSIS — G4710 Hypersomnia, unspecified: Secondary | ICD-10-CM

## 2015-11-11 DIAGNOSIS — G2581 Restless legs syndrome: Secondary | ICD-10-CM | POA: Diagnosis not present

## 2015-11-11 DIAGNOSIS — G459 Transient cerebral ischemic attack, unspecified: Secondary | ICD-10-CM | POA: Diagnosis not present

## 2015-11-11 DIAGNOSIS — R519 Headache, unspecified: Secondary | ICD-10-CM

## 2015-11-11 NOTE — Patient Instructions (Signed)

## 2015-11-11 NOTE — Progress Notes (Signed)
Subjective:    Patient ID: Jennifer Blackburn is a 70 y.o. female.  HPI     Star Age, MD, PhD [facility] [address] [place],  [address]  Dear Bonnita Levan,  I saw your patient, Jennifer Blackburn, upon your kind request in my clinic today for initial consultation of her sleep disorder, in particular, concern for underlying obstructive sleep apnea. The patient is unaccompanied today. As you know, [name] is a 70 year old right-handed woman with an underlying medical history of anxiety, depression, type 2 diabetes, degenerative joint disease, hyperlipidemia, hypertension, thyroid cancer, coronary artery disease, status post multiple operations, including right knee surgery arthroscopic, left hip replacement surgery, cervical spine surgery, hysterectomy, cataract repair on the right, and history of obesity, who reports poor sleep consolidation, and excessive daytime somnolence. She is a I reviewed your office note from 10/27/2015, there was concern for possible TIA, has had intermittent R facial numbness and she went to [facility] in July for this, then ER.  W/u thus far has included MRI brain, but results are not available for my review. She had a C. Doppler on 10/29/15: IMPRESSION: Mild atherosclerotic disease in the carotid arteries. No significant carotid artery stenosis. Estimated degree of stenosis in the internal carotid arteries is less than 50% bilaterally. Patent vertebral arteries. She has occasional AM HAs, no nightly nocturia, had RLS Sx off and on.  She reports stress.  She has one son and one daughter.  She has 4 granddaughters.  She is a non-smoker but is exposed to secondhand smoke at the house because of her husband smoking. Her husband has obstructive sleep apnea and uses a CPAP machine, if he falls asleep without it his snoring will disturb her. They tend to watch TV in bed but turn the TV off before falling asleep. Bedtime is generally  between 11:30 and midnight for her and wake up time is 9 AM, she wakes up fairly adequately rested typically, Epworth sleepiness score is 4 out of 24 today, fatigue score is 10 out of 63. She used to have intermittent restless leg symptoms, not so much lately.she is retired from [employer] for 32 years.  Her Past Medical History Is Significant For: Past Medical History:  Diagnosis Date  . Anxiety   . CAD (coronary artery disease)   . Depression   . Diabetes mellitus type II    without complication  . DJD (degenerative joint disease) of lumbar spine   . Hypercholesteremia   . Hyperlipidemia   . Hypertension    benign   . Thyroid cancer [facility]) 2001    Her Past Surgical History Is Significant For: Past Surgical History:  Procedure Laterality Date  . ABDOMINAL HYSTERECTOMY    . CARDIAC CATHETERIZATION    . CATARACT EXTRACTION W/PHACO Right 07/17/2012   Procedure: CATARACT EXTRACTION PHACO AND INTRAOCULAR LENS PLACEMENT (IOC);  Surgeon: Tonny Branch, MD;  Location: [facility] ORS;  Service: Ophthalmology;  Laterality: Right;  CDE:25.51  . COLONOSCOPY    . COLONOSCOPY N/A 01/15/2014   Procedure: COLONOSCOPY;  Surgeon: Daneil Dolin, MD;  Location: [facility] ENDO SUITE;  Service: Endoscopy;  Laterality: N/A;  9:00 AM  . DOPPLER ECHOCARDIOGRAPHY    . JOINT REPLACEMENT  07/01/2010   left hip  . KNEE SURGERY Right    arthroscopy  . left hip replaced  07/01/2010  . SPINE SURGERY  2006   cervical  . stress dipyridamole myocardial perfusion    . THYROIDECTOMY    .  TONSILLECTOMY    . VESICOVAGINAL FISTULA CLOSURE W/ TAH      Her Family History Is Significant For: Family History  Problem Relation Age of Onset  . Heart attack Father   . Heart failure Mother   . Colon cancer Neg Hx     Her Social History Is Significant For: Social History   Social History  . Marital status: Married    Spouse name: N/A  . Number of children: 2  . Years of education: 14   Occupational History  . retired      Merchandiser, retail    Social History Main Topics  . Smoking status: Passive Smoke Exposure - Never Smoker  . Smokeless tobacco: Never Used  . Alcohol use No  . Drug use: No  . Sexual activity: Not Asked   Other Topics Concern  . None   Social History Narrative   Lives with husband, at home   No caffeine    Her Allergies Are:  Allergies  Allergen Reactions  . Nsaids Hives  . Sulfonamide Derivatives Hives  :   Her Current Medications Are:  Outpatient Encounter Prescriptions as of 11/11/2015  Medication Sig  . aspirin EC 81 MG tablet Take 1 tablet (81 mg total) by mouth daily.  Marland Kitchen CINNAMON PO Take by mouth.  . gabapentin (NEURONTIN) 100 MG capsule Take 1 capsule (100 mg total) by mouth 3 (three) times daily.  Marland Kitchen glucose blood (ACCU-CHEK AVIVA PLUS) test strip Use as instructed four times daily dx E11.65  . imipramine (TOFRANIL) 25 MG tablet TAKE 4 TABLETS BY MOUTH EVERY NIGHT AT BEDTIME  . Insulin Glargine (TOUJEO SOLOSTAR) 300 UNIT/ML SOPN Inject 60 Units into the skin at bedtime.  . Insulin Pen Needle (B-D ULTRAFINE III SHORT PEN) 31G X 8 MM MISC Use to inject insulin daily  . Lancets (ACCU-CHEK MULTICLIX) lancets Use as instructed three times daily dx 250.01  . levothyroxine (SYNTHROID) 200 MCG tablet Take 1 tablet (200 mcg total) by mouth daily before breakfast.  . linagliptin (TRADJENTA) 5 MG TABS tablet Take 5 mg by mouth daily.  Marland Kitchen loratadine (CLARITIN) 10 MG tablet Take 10 mg by mouth daily.  . metFORMIN (GLUCOPHAGE) 1000 MG tablet TAKE 1 TABLET BY MOUTH TWICE DAILY  . metoprolol (LOPRESSOR) 50 MG tablet TAKE 1 TABLET BY MOUTH TWICE DAILY  . PARoxetine (PAXIL) 20 MG tablet TAKE 1 TABLET BY MOUTH EVERY DAY  . pravastatin (PRAVACHOL) 80 MG tablet Take 1 tablet (80 mg total) by mouth daily.  . quinapril (ACCUPRIL) 10 MG tablet TAKE 1 TABLET BY MOUTH DAILY  . verapamil (VERELAN PM) 180 MG 24 hr capsule TAKE 1 CAPSULE BY MOUTH EVERY DAY  . vitamin B-12 (CYANOCOBALAMIN) 1000 MCG tablet Take 1  tablet (1,000 mcg total) by mouth daily.  . Vitamin D, Ergocalciferol, (DRISDOL) 50000 units CAPS capsule TAKE 1 CAPSULE BY MOUTH EVERY 7 DAYS   No facility-administered encounter medications on file as of 11/11/2015.   :  Review of Systems:  Out of a complete 14 point review of systems, all are reviewed and negative with the exception of these symptoms as listed below: Review of Systems  Neurological:       Sometimes has trouble falling and staying asleep, morning headaches, denies taking naps.    Epworth Sleepiness Scale 0= would never doze 1= slight chance of dozing 2= moderate chance of dozing 3= high chance of dozing  Sitting and reading:1 Watching TV:1 Sitting inactive in a public place (ex. Theater or  meeting): As a passenger in a car for an hour without a break:1 Lying down to rest in the afternoon:1 Sitting and talking to someone:0 Sitting quietly after lunch (no alcohol):0 In a car, while stopped in traffic:0 Total:4  Objective:  Neurologic Exam  Physical Exam Physical Examination:   Vitals:   11/11/15 1125  BP: 124/60  Pulse: 72  Resp: 16    General Examination: The patient is a very pleasant 70 y.o. female in no acute distress. She appears well-developed and well-nourished and very well groomed.   HEENT: Normocephalic, atraumatic, pupils are equal, round and reactive to light and accommodation. Funduscopic exam is normal with sharp disc margins noted. Extraocular tracking is good without limitation to gaze excursion or nystagmus noted. Normal smooth pursuit is noted. Hearing is grossly intact. Tympanic membranes are clear bilaterally. Face is symmetric with normal facial animation and normal facial sensation. Speech is clear with no dysarthria noted. There is no hypophonia. There is no lip, neck/head, jaw or voice tremor. Neck is supple with full range of passive and active motion. There are no carotid bruits on auscultation. Oropharynx exam reveals: mild mouth  dryness, adequate dental hygiene and mild airway crowding, due to smaller airway. Mallampati is class II. Tongue protrudes centrally and palate elevates symmetrically. Tonsils are absent. Neck size is 16 1/8 inches. She has a Mild overbite. Nasal inspection reveals no significant nasal mucosal bogginess or redness and no septal deviation.   Chest: Clear to auscultation without wheezing, rhonchi or crackles noted.  Heart: S1+S2+0, regular and normal without murmurs, rubs or gallops noted.   Abdomen: Soft, non-tender and non-distended with normal bowel sounds appreciated on auscultation.  Extremities: There is no pitting edema in the distal lower extremities bilaterally. Pedal pulses are intact.  Skin: Warm and dry without trophic changes noted. There are no varicose veins.  Musculoskeletal: exam reveals no obvious joint deformities, tenderness or joint swelling or erythema.   Neurologically:  Mental status: The patient is awake, alert and oriented in all 4 spheres. Her immediate and remote memory, attention, language skills and fund of knowledge are appropriate. There is no evidence of aphasia, agnosia, apraxia or anomia. Speech is clear with normal prosody and enunciation. Thought process is linear. Mood is normal and affect is normal.  Cranial nerves II - XII are as described above under HEENT exam. In addition: shoulder shrug is normal with equal shoulder height noted. Motor exam: Normal bulk, strength and tone is noted. There is no drift, tremor or rebound. Romberg is negative. Reflexes are 2+ throughout. Babinski: Toes are flexor bilaterally. Fine motor skills and coordination: intact with normal finger taps, normal hand movements, normal rapid alternating patting, normal foot taps and normal foot agility.  Cerebellar testing: No dysmetria or intention tremor on finger to nose testing. Heel to shin is unremarkable bilaterally. There is no truncal or gait ataxia.  Sensory exam: intact to light  touch, pinprick, vibration, temperature sense in the upper and lower extremities.  Gait, station and balance: She stands easily. No veering to one side is noted. No leaning to one side is noted. Posture is age-appropriate and stance is narrow based. Gait shows normal stride length and normal pace. No problems turning are noted. Tandem walk is unremarkable.  Assessment and Plan:  In summary, HAMIDA JIMINEZ is a very pleasant 70 y.o.-year old female with an underlying medical history of anxiety, depression, type 2 diabetes, degenerative joint disease, hyperlipidemia, hypertension, thyroid cancer, coronary artery disease, status post multiple  operations, including right knee surgery arthroscopic, left hip replacement surgery, cervical spine surgery, hysterectomy, cataract repair on the right, and history of obesity, whose history is not telltale for obstructive sleep apnea but history and physical exam are somewhat concerning for obstructive sleep apnea (OSA). I had a long chat with the patient about my findings and the diagnosis of OSA, its prognosis and treatment options. We talked about medical treatments, surgical interventions and non-pharmacological approaches. I explained in particular the risks and ramifications of untreated moderate to severe OSA, especially with respect to developing cardiovascular disease down the Road, including congestive heart failure, difficult to treat hypertension, cardiac arrhythmias, or stroke. Even type 2 diabetes has, in part, been linked to untreated OSA. Symptoms of untreated OSA include daytime sleepiness, memory problems, mood irritability and mood disorder such as depression and anxiety, lack of energy, as well as recurrent headaches, especially morning headaches. We talked about trying to maintain a healthy lifestyle in general, as well as the importance of weight control. I encouraged the patient to eat healthy, exercise daily and keep well hydrated, to keep a  scheduled bedtime and wake time routine, to not skip any meals and eat healthy snacks in between meals. I advised the patient not to drive when feeling sleepy. I recommended the following at this time: sleep study with potential positive airway pressure titration. (We will score hypopneas at 4% and split the sleep study into diagnostic and treatment portion, if the estimated. 2 hour AHI is >15/h).   Her neurological exam is nonfocal, she is reassured in that regard. She is wondering if some of this is stress related. She is advised that sometimes stress can indeed cause intermittent symptoms such as numbness, tingling, even twitching, but it is good to look for organic causes for her symptoms.  I explained the sleep test procedure to the patient and also outlined possible surgical and non-surgical treatment options of OSA, including the use of a custom-made dental device (which would require a referral to a specialist dentist or oral surgeon), upper airway surgical options, such as pillar implants, radiofrequency surgery, tongue base surgery, and UPPP (which would involve a referral to an ENT surgeon). Rarely, jaw surgery such as mandibular advancement may be considered.  I also explained the CPAP treatment option to the patient, who indicated that she would be willing to try CPAP if the need arises. I explained the importance of being compliant with PAP treatment, not only for insurance purposes but primarily to improve Her symptoms, and for the patient's long term health benefit, including to reduce Her cardiovascular risks. I answered all her questions today and the patient was in agreement. I would like to see back after the sleep study is completed and encouraged her to call with any interim questions, concerns, problems or updates.   Thank you very much for allowing me to participate in the care of this nice patient. If I can be of any further assistance to you please do not hesitate to talk to  me.  Sincerely,   Star Age, MD, PhD

## 2015-11-18 DIAGNOSIS — E1142 Type 2 diabetes mellitus with diabetic polyneuropathy: Secondary | ICD-10-CM | POA: Diagnosis not present

## 2015-11-18 DIAGNOSIS — L851 Acquired keratosis [keratoderma] palmaris et plantaris: Secondary | ICD-10-CM | POA: Diagnosis not present

## 2015-11-18 DIAGNOSIS — M2040 Other hammer toe(s) (acquired), unspecified foot: Secondary | ICD-10-CM | POA: Diagnosis not present

## 2015-11-21 ENCOUNTER — Other Ambulatory Visit: Payer: Self-pay | Admitting: Family Medicine

## 2015-12-09 ENCOUNTER — Ambulatory Visit (INDEPENDENT_AMBULATORY_CARE_PROVIDER_SITE_OTHER): Payer: Medicare Other | Admitting: Diagnostic Neuroimaging

## 2015-12-09 ENCOUNTER — Ambulatory Visit: Payer: Medicare Other | Admitting: Diagnostic Neuroimaging

## 2015-12-09 ENCOUNTER — Encounter: Payer: Self-pay | Admitting: Diagnostic Neuroimaging

## 2015-12-09 VITALS — BP 125/70 | HR 69 | Wt 194.4 lb

## 2015-12-09 DIAGNOSIS — E1159 Type 2 diabetes mellitus with other circulatory complications: Secondary | ICD-10-CM | POA: Diagnosis not present

## 2015-12-09 DIAGNOSIS — E669 Obesity, unspecified: Secondary | ICD-10-CM

## 2015-12-09 DIAGNOSIS — R208 Other disturbances of skin sensation: Secondary | ICD-10-CM

## 2015-12-09 DIAGNOSIS — G459 Transient cerebral ischemic attack, unspecified: Secondary | ICD-10-CM | POA: Diagnosis not present

## 2015-12-09 DIAGNOSIS — I2510 Atherosclerotic heart disease of native coronary artery without angina pectoris: Secondary | ICD-10-CM | POA: Diagnosis not present

## 2015-12-09 DIAGNOSIS — R200 Anesthesia of skin: Secondary | ICD-10-CM

## 2015-12-09 NOTE — Progress Notes (Signed)
[facility]  PATIENT: Jennifer Blackburn DOB: April 02, 1945  REFERRING CLINICIAN: Moshe Cipro HISTORY FROM: patient  REASON FOR VISIT: follow up   HISTORICAL  CHIEF COMPLAINT:  Chief Complaint  Patient presents with  . Other    rm 7, Other specified transient cerebral ischemias, "doing well"  . Follow-up    6 week    HISTORY OF PRESENT ILLNESS:   UPDATE 12/09/15: Since last visit, had 1 more minor facial event (last week), but not other sxs. TTE and sleep study are pending. Overall doing well.   PRIOR HPI (10/27/15): 70 year old right-handed female with history of diabetes, hypothyroidism, hypertension, hypercholesterolemia, here for evaluation of right face, throat, tongue and hand numbness. 10/07/15 patient noticed sudden onset right face numbness lasting 2-3 minutes at a time intermittently. This went on for 1-2 weeks. July 19 patient also noticed right face and right hand numbness. Patient apparently went to urgent care or emergency room had CT scan and MRI scan as of the brain for the symptoms which ruled out acute stroke. Patient presented here for further evaluation and treatment. Patient previously was taking aspirin on a daily basis. This was stopped 2-3 years ago.   REVIEW OF SYSTEMS: Full 14 system review of systems performed and negative with exception of: back pain.   ALLERGIES: Allergies  Allergen Reactions  . Nsaids Hives  . Sulfonamide Derivatives Hives    HOME MEDICATIONS: Outpatient Medications Prior to Visit  Medication Sig Dispense Refill  . aspirin EC 81 MG tablet Take 1 tablet (81 mg total) by mouth daily.    Marland Kitchen CINNAMON PO Take by mouth.    . gabapentin (NEURONTIN) 100 MG capsule Take 1 capsule (100 mg total) by mouth 3 (three) times daily. 90 capsule 3  . glucose blood (ACCU-CHEK AVIVA PLUS) test strip Use as instructed four times daily dx E11.65 150 each 5  . imipramine (TOFRANIL) 25 MG tablet TAKE 4 TABLETS BY MOUTH EVERY NIGHT AT BEDTIME  120 tablet 1  . Insulin Glargine (TOUJEO SOLOSTAR) 300 UNIT/ML SOPN Inject 60 Units into the skin at bedtime.    . Insulin Pen Needle (B-D ULTRAFINE III SHORT PEN) 31G X 8 MM MISC Use to inject insulin daily 50 each 1  . Lancets (ACCU-CHEK MULTICLIX) lancets Use as instructed three times daily dx 250.01 100 each 5  . levothyroxine (SYNTHROID) 200 MCG tablet Take 1 tablet (200 mcg total) by mouth daily before breakfast. 30 tablet 5  . linagliptin (TRADJENTA) 5 MG TABS tablet Take 5 mg by mouth daily.    Marland Kitchen loratadine (CLARITIN) 10 MG tablet Take 10 mg by mouth daily.    . metFORMIN (GLUCOPHAGE) 1000 MG tablet TAKE 1 TABLET BY MOUTH TWICE DAILY 180 tablet 0  . metoprolol (LOPRESSOR) 50 MG tablet TAKE 1 TABLET BY MOUTH TWICE DAILY 60 tablet 8  . PARoxetine (PAXIL) 20 MG tablet TAKE 1 TABLET BY MOUTH EVERY DAY 30 tablet 2  . pravastatin (PRAVACHOL) 80 MG tablet Take 1 tablet (80 mg total) by mouth daily. 90 tablet 0  . quinapril (ACCUPRIL) 10 MG tablet TAKE 1 TABLET BY MOUTH DAILY 30 tablet 1  . verapamil (VERELAN PM) 180 MG 24 hr capsule TAKE 1 CAPSULE BY MOUTH EVERY DAY 90 capsule 3  . vitamin B-12 (CYANOCOBALAMIN) 1000 MCG tablet Take 1 tablet (1,000 mcg total) by mouth daily. 30 tablet 5  . Vitamin D, Ergocalciferol, (DRISDOL) 50000 units CAPS capsule TAKE 1 CAPSULE BY MOUTH EVERY 7 DAYS 4 capsule 6  No facility-administered medications prior to visit.     PAST MEDICAL HISTORY: Past Medical History:  Diagnosis Date  . Anxiety   . CAD (coronary artery disease)   . Depression   . Diabetes mellitus type II    without complication  . DJD (degenerative joint disease) of lumbar spine   . Hypercholesteremia   . Hyperlipidemia   . Hypertension    benign   . Thyroid cancer ([facility]) 2001    PAST SURGICAL HISTORY: Past Surgical History:  Procedure Laterality Date  . ABDOMINAL HYSTERECTOMY    . CARDIAC CATHETERIZATION    . CATARACT EXTRACTION W/PHACO Right 07/17/2012   Procedure: CATARACT  EXTRACTION PHACO AND INTRAOCULAR LENS PLACEMENT (IOC);  Surgeon: Tonny Branch, MD;  Location: [facility] ORS;  Service: Ophthalmology;  Laterality: Right;  CDE:25.51  . COLONOSCOPY    . COLONOSCOPY N/A 01/15/2014   Procedure: COLONOSCOPY;  Surgeon: Daneil Dolin, MD;  Location: [facility] ENDO SUITE;  Service: Endoscopy;  Laterality: N/A;  9:00 AM  . DOPPLER ECHOCARDIOGRAPHY    . JOINT REPLACEMENT  07/01/2010   left hip  . KNEE SURGERY Right    arthroscopy  . left hip replaced  07/01/2010  . SPINE SURGERY  2006   cervical  . stress dipyridamole myocardial perfusion    . THYROIDECTOMY    . TONSILLECTOMY    . VESICOVAGINAL FISTULA CLOSURE W/ TAH      FAMILY HISTORY: Family History  Problem Relation Age of Onset  . Heart attack Father   . Heart failure Mother   . Colon cancer Neg Hx     SOCIAL HISTORY:  Social History   Social History  . Marital status: Married    Spouse name: N/A  . Number of children: 2  . Years of education: 14   Occupational History  . retired      Merchandiser, retail   Social History Main Topics  . Smoking status: Passive Smoke Exposure - Never Smoker  . Smokeless tobacco: Never Used  . Alcohol use No  . Drug use: No  . Sexual activity: Not on file   Other Topics Concern  . Not on file   Social History Narrative   Lives with husband, at home   No caffeine     PHYSICAL EXAM  GENERAL EXAM/CONSTITUTIONAL: Vitals:  Vitals:   12/09/15 1257  BP: 125/70  Pulse: 69  Weight: 194 lb 6.4 oz (88.2 kg)   Wt Readings from Last 3 Encounters:  12/09/15 194 lb 6.4 oz (88.2 kg)  11/11/15 194 lb (88 kg)  10/27/15 196 lb 9.6 oz (89.2 kg)    Body mass index is 32.35 kg/m. No exam data present  Patient is in no distress; well developed, nourished and groomed; neck is supple  CARDIOVASCULAR:  Examination of carotid arteries is normal; no carotid bruits  Regular rate and rhythm, no murmurs  Examination of peripheral vascular system by observation and palpation is  normal  EYES:  Ophthalmoscopic exam of optic discs and posterior segments is normal; no papilledema or hemorrhages  MUSCULOSKELETAL:  Gait, strength, tone, movements noted in Neurologic exam below  NEUROLOGIC: MENTAL STATUS:  No flowsheet data found.  awake, alert, oriented to person, place and time  recent and remote memory intact  normal attention and concentration  language fluent, comprehension intact, naming intact,   fund of knowledge appropriate  CRANIAL NERVE:   2nd - no papilledema on fundoscopic exam  2nd, 3rd, 4th, 6th - pupils equal and reactive to light, visual  fields full to confrontation, extraocular muscles intact, no nystagmus  5th - facial sensation symmetric  7th - facial strength symmetric  8th - hearing intact  9th - palate elevates symmetrically, uvula midline  11th - shoulder shrug symmetric  12th - tongue protrusion midline  MOTOR:   normal bulk and tone, full strength in the BUE, BLE  SENSORY:   normal and symmetric to light touch, temperature, vibration  COORDINATION:   finger-nose-finger, fine finger movements normal  REFLEXES:   deep tendon reflexes present and symmetric  GAIT/STATION:   narrow based gait    DIAGNOSTIC DATA (LABS, IMAGING, TESTING) - I reviewed patient records, labs, notes, testing and imaging myself where available.  Lab Results  Component Value Date   WBC 6.1 08/20/2014   HGB 11.6 (L) 08/20/2014   HCT 35.9 (L) 08/20/2014   MCV 83.9 08/20/2014   PLT 254 08/20/2014      Component Value Date/Time   NA 141 11/14/2014 0836   K 4.1 11/14/2014 0836   CL 100 11/14/2014 0836   CO2 31 11/14/2014 0836   GLUCOSE 172 (H) 11/14/2014 0836   BUN 9 11/14/2014 0836   CREATININE 0.73 11/14/2014 0836   CALCIUM 8.9 11/14/2014 0836   PROT 7.2 11/14/2014 0836   ALBUMIN 4.1 11/14/2014 0836   AST 22 11/14/2014 0836   ALT 21 11/14/2014 0836   ALKPHOS 36 11/14/2014 0836   BILITOT 0.3 11/14/2014 0836    GFRNONAA 84 11/14/2014 0836   GFRAA >89 11/14/2014 0836   Lab Results  Component Value Date   CHOL 175 11/14/2014   HDL 42 (L) 11/14/2014   LDLCALC 132 10/20/2015   TRIG 106 11/14/2014   CHOLHDL 4.2 11/14/2014   Lab Results  Component Value Date   HGBA1C 8.0 10/20/2015   Lab Results  Component Value Date   VITAMINB12 183 (L) 08/20/2014   Lab Results  Component Value Date   TSH 0.070 (L) 10/13/2010    10/08/15 EKG - normal sinus rhythm  10/08/15 MRI brain [report only] - No acute intracranial pathology identified, specifically no acute infarct. - Mild to moderate chronic small vessel ischemic change. - Chronic sphenoid sinus disease.  10/29/15 Carotid u/s - Mild atherosclerotic disease in the carotid arteries. No significant carotid artery stenosis. Estimated degree of stenosis in the internal carotid arteries is less than 50% bilaterally.  - Patent vertebral arteries.    ASSESSMENT AND PLAN  70 y.o. year old female here with intermittent right face and arm numbness and tingling in July (and minor event Sept 2017), likely representing transient ischemic attacks. Symptoms localize to left parietal lobe versus left brain subcortical (posterior limb of internal capsule). We'll complete workup and recommend medical management for secondary stroke prevention.   Dx: TIA (left brain)  1. Transient cerebral ischemia, unspecified transient cerebral ischemia type   2. Type 2 diabetes mellitus with vascular disease ([place])   3. Right facial numbness   4. Obesity (BMI 30.0-34.9)      PLAN: - continue aspirin 81mg  daily - follow up TTE (echo) - continue statin and diabetes medications - check sleep study to rule out sleep apnea - TIA/stroke warning signs and education reviewed  Return in about 4 months (around 04/09/2016).    Penni Bombard, MD [id number], 0000000 PM Certified in Neurology, Neurophysiology and Neuroimaging  [facility] [address]  [place], [place] [address] [phone]

## 2015-12-11 DIAGNOSIS — E1165 Type 2 diabetes mellitus with hyperglycemia: Secondary | ICD-10-CM | POA: Diagnosis not present

## 2015-12-11 DIAGNOSIS — C73 Malignant neoplasm of thyroid gland: Secondary | ICD-10-CM | POA: Diagnosis not present

## 2015-12-11 DIAGNOSIS — E784 Other hyperlipidemia: Secondary | ICD-10-CM | POA: Diagnosis not present

## 2015-12-15 ENCOUNTER — Ambulatory Visit (HOSPITAL_COMMUNITY)
Admission: RE | Admit: 2015-12-15 | Discharge: 2015-12-15 | Disposition: A | Discharge status: Home / Self Care | Payer: Medicare Other | Source: Ambulatory Visit | Attending: Family Medicine | Admitting: Family Medicine

## 2015-12-15 ENCOUNTER — Ambulatory Visit (INDEPENDENT_AMBULATORY_CARE_PROVIDER_SITE_OTHER): Payer: Medicare Other | Admitting: Family Medicine

## 2015-12-15 ENCOUNTER — Encounter: Payer: Self-pay | Admitting: Family Medicine

## 2015-12-15 VITALS — BP 130/66 | HR 76 | Resp 18 | Ht 65.0 in | Wt 195.1 lb

## 2015-12-15 DIAGNOSIS — M47816 Spondylosis without myelopathy or radiculopathy, lumbar region: Secondary | ICD-10-CM | POA: Insufficient documentation

## 2015-12-15 DIAGNOSIS — I2510 Atherosclerotic heart disease of native coronary artery without angina pectoris: Secondary | ICD-10-CM | POA: Diagnosis not present

## 2015-12-15 DIAGNOSIS — G8911 Acute pain due to trauma: Secondary | ICD-10-CM

## 2015-12-15 DIAGNOSIS — N200 Calculus of kidney: Secondary | ICD-10-CM | POA: Insufficient documentation

## 2015-12-15 DIAGNOSIS — M545 Low back pain: Secondary | ICD-10-CM | POA: Insufficient documentation

## 2015-12-15 DIAGNOSIS — M5136 Other intervertebral disc degeneration, lumbar region: Secondary | ICD-10-CM | POA: Diagnosis not present

## 2015-12-15 DIAGNOSIS — M4316 Spondylolisthesis, lumbar region: Secondary | ICD-10-CM | POA: Diagnosis not present

## 2015-12-15 MED ORDER — TIZANIDINE HCL 4 MG PO CAPS: 4.0000 mg | ORAL_CAPSULE | Freq: Three times a day (TID) | ORAL | 0 refills | Status: DC | PRN

## 2015-12-15 NOTE — Progress Notes (Signed)
Chief Complaint  Patient presents with  . Back Pain    lower back (fall)    Pleasant 70 year old patient of Dr. Moshe Cipro. She is here for low back pain. She states she was sitting at home on office chair that broke. She fell backwards and landed on her ROM and low back, and then rolled back to hit the back of her head. She had pain immediately. The pain is persisted. It is in the central low back just above her pelvis. She has some pain up towards the shoulder blades. No radiation of pain, no numbness or weakness into the hips or legs. No problems with gait or ambulation. No bowel or bladder complaint. No sensory deficits. She has known degenerative disc disease in the low back, she has had spinal surgery on her neck. No known osteoporosis. The pain is worse with movement and better with rest. She is taking ibuprofen 400 mg once a day for pain. This helps marginally, and then wears off. She is afraid to take more.  Patient Active Problem List   Diagnosis Date Noted  . Right facial numbness 10/15/2015  . Chronic sinusitis 05/22/2015  . Type 2 diabetes mellitus with vascular disease ([place]) 01/13/2015  . GERD (gastroesophageal reflux disease) 04/11/2014  . At high risk for falls 12/31/2013  . Left anterior knee pain 11/06/2013  . Postsurgical hypothyroidism 01/05/2013  . Snoring 01/05/2013  . Back pain with radiation 11/28/2012  . TMJ arthritis 12/06/2011  . Anxiety and depression 10/16/2011  . Anemia 03/30/2011  . Vitamin D deficiency 10/14/2010  . Carotid bruit present 10/13/2010  . Obesity (BMI 30.0-34.9) 03/30/2008  . Allergic rhinitis 11/23/2007  . Hyperlipidemia 04/14/2007  . CARPAL TUNNEL SYNDROME, BILATERAL 04/14/2007  . ESSENTIAL HYPERTENSION, BENIGN 04/14/2007  . Coronary atherosclerosis 04/14/2007  . Headache 04/14/2007    Outpatient Encounter Prescriptions as of 12/15/2015  Medication Sig  . aspirin EC 81 MG tablet Take 1 tablet (81 mg total) by mouth daily.  Marland Kitchen CINNAMON  PO Take by mouth.  . gabapentin (NEURONTIN) 100 MG capsule Take 1 capsule (100 mg total) by mouth 3 (three) times daily.  Marland Kitchen glucose blood (ACCU-CHEK AVIVA PLUS) test strip Use as instructed four times daily dx E11.65  . imipramine (TOFRANIL) 25 MG tablet TAKE 4 TABLETS BY MOUTH EVERY NIGHT AT BEDTIME  . Insulin Glargine (TOUJEO SOLOSTAR) 300 UNIT/ML SOPN Inject 60 Units into the skin at bedtime.  . Insulin Pen Needle (B-D ULTRAFINE III SHORT PEN) 31G X 8 MM MISC Use to inject insulin daily  . Lancets (ACCU-CHEK MULTICLIX) lancets Use as instructed three times daily dx 250.01  . levothyroxine (SYNTHROID) 200 MCG tablet Take 1 tablet (200 mcg total) by mouth daily before breakfast.  . linagliptin (TRADJENTA) 5 MG TABS tablet Take 5 mg by mouth daily.  Marland Kitchen loratadine (CLARITIN) 10 MG tablet Take 10 mg by mouth daily.  . metFORMIN (GLUCOPHAGE) 1000 MG tablet TAKE 1 TABLET BY MOUTH TWICE DAILY  . metoprolol (LOPRESSOR) 50 MG tablet TAKE 1 TABLET BY MOUTH TWICE DAILY  . PARoxetine (PAXIL) 20 MG tablet TAKE 1 TABLET BY MOUTH EVERY DAY  . pravastatin (PRAVACHOL) 80 MG tablet Take 1 tablet (80 mg total) by mouth daily.  . quinapril (ACCUPRIL) 10 MG tablet TAKE 1 TABLET BY MOUTH DAILY  . verapamil (VERELAN PM) 180 MG 24 hr capsule TAKE 1 CAPSULE BY MOUTH EVERY DAY  . vitamin B-12 (CYANOCOBALAMIN) 1000 MCG tablet Take 1 tablet (1,000 mcg total) by mouth  daily.  . Vitamin D, Ergocalciferol, (DRISDOL) 50000 units CAPS capsule TAKE 1 CAPSULE BY MOUTH EVERY 7 DAYS  . tiZANidine (ZANAFLEX) 4 MG capsule Take 1 capsule (4 mg total) by mouth 3 (three) times daily as needed for muscle spasms.   No facility-administered encounter medications on file as of 12/15/2015.     Allergies  Allergen Reactions  . Sulfonamide Derivatives Hives    Review of Systems  Constitutional: Positive for activity change. Negative for appetite change and fever.       Decreased activity  HENT: Negative.   Eyes: Negative.  Negative  for visual disturbance.  Respiratory: Negative for cough and shortness of breath.   Cardiovascular: Negative for chest pain, palpitations and leg swelling.  Gastrointestinal: Negative for constipation and diarrhea.  Genitourinary: Negative for difficulty urinating and flank pain.  Musculoskeletal: Positive for back pain. Negative for neck pain and neck stiffness.  Skin: Negative for rash and wound.       No bruising  Neurological: Negative for weakness and numbness.  Psychiatric/Behavioral: Positive for sleep disturbance. Negative for dysphoric mood. The patient is not nervous/anxious.        Sleep impaired due to pain    BP 130/66   Pulse 76   Resp 18   Ht 5\' 5"  (1.651 m)   Wt 195 lb 1.9 oz (88.5 kg)   SpO2 98%   BMI 32.47 kg/m   Physical Exam  Constitutional: She is oriented to person, place, and time. She appears well-developed and well-nourished. No distress.  Uncomfortable with movement  HENT:  Head: Normocephalic and atraumatic.  Mouth/Throat: Oropharynx is clear and moist.  Neck: Normal range of motion. Neck supple.  No tenderness to palpation neck or upper back  Cardiovascular: Normal rate, regular rhythm and normal heart sounds.   Pulmonary/Chest: Effort normal and breath sounds normal. No respiratory distress.  Musculoskeletal: Normal range of motion. She exhibits edema.  Lumbar spine is straight and symmetric. Tender to palpation over L 4 and 5 spines. Mild increased muscle tension in the lumbar muscles. Limited flexion due to pain. No pain across S I joint or posterior pelvis  Lymphadenopathy:    She has no cervical adenopathy.  Neurological: She is alert and oriented to person, place, and time. She has normal reflexes.  Strength, sensation, range of motion and reflexes are normal in both lower extremities. Straight leg raise is negative bilaterally.  Skin: Skin is warm and dry.  Psychiatric: She has a normal mood and affect. Her behavior is normal. Thought content  normal.    ASSESSMENT/PLAN:  1. Acute low back pain due to trauma Discussed lumbar strain. I felt this is likely musculoligamentous. Getting an x-ray to be sure there is no compression fracture or alignment issue. - DG Lumbar Spine Complete; Future   Patient Instructions  Activity as tolerated Increase the ibuprofen to 2 to 3 times a day with food for one week Use the tizanidine as needed for muscle spasm Use ice or heat to back  Go for x ray,  If there is anything of concern, we will call right away.  Call if not improving in a week or two   Raylene Everts, MD

## 2015-12-15 NOTE — Patient Instructions (Signed)
Activity as tolerated Increase the ibuprofen to 2 to 3 times a day with food for one week Use the tizanidine as needed for muscle spasm Use ice or heat to back  Go for x ray,  If there is anything of concern, we will call right away.  Call if not improving in a week or two

## 2015-12-16 ENCOUNTER — Other Ambulatory Visit: Payer: Self-pay

## 2015-12-16 ENCOUNTER — Ambulatory Visit (HOSPITAL_COMMUNITY): Payer: Medicare Other | Attending: Cardiology

## 2015-12-16 ENCOUNTER — Other Ambulatory Visit: Payer: Self-pay | Admitting: Family Medicine

## 2015-12-16 DIAGNOSIS — I371 Nonrheumatic pulmonary valve insufficiency: Secondary | ICD-10-CM | POA: Diagnosis not present

## 2015-12-16 DIAGNOSIS — Z8249 Family history of ischemic heart disease and other diseases of the circulatory system: Secondary | ICD-10-CM | POA: Diagnosis not present

## 2015-12-16 DIAGNOSIS — G458 Other transient cerebral ischemic attacks and related syndromes: Secondary | ICD-10-CM | POA: Diagnosis not present

## 2015-12-16 DIAGNOSIS — E785 Hyperlipidemia, unspecified: Secondary | ICD-10-CM | POA: Diagnosis not present

## 2015-12-16 DIAGNOSIS — I340 Nonrheumatic mitral (valve) insufficiency: Secondary | ICD-10-CM | POA: Diagnosis not present

## 2015-12-16 DIAGNOSIS — E119 Type 2 diabetes mellitus without complications: Secondary | ICD-10-CM | POA: Insufficient documentation

## 2015-12-16 DIAGNOSIS — I2510 Atherosclerotic heart disease of native coronary artery without angina pectoris: Secondary | ICD-10-CM | POA: Diagnosis not present

## 2015-12-16 DIAGNOSIS — I119 Hypertensive heart disease without heart failure: Secondary | ICD-10-CM | POA: Insufficient documentation

## 2015-12-16 DIAGNOSIS — G459 Transient cerebral ischemic attack, unspecified: Secondary | ICD-10-CM | POA: Diagnosis present

## 2015-12-16 MED ORDER — CYCLOBENZAPRINE HCL 5 MG PO TABS: 5.0000 mg | ORAL_TABLET | Freq: Three times a day (TID) | ORAL | 0 refills | Status: DC | PRN

## 2015-12-18 ENCOUNTER — Ambulatory Visit (INDEPENDENT_AMBULATORY_CARE_PROVIDER_SITE_OTHER): Payer: Medicare Other | Admitting: Neurology

## 2015-12-18 DIAGNOSIS — G4720 Circadian rhythm sleep disorder, unspecified type: Secondary | ICD-10-CM

## 2015-12-18 DIAGNOSIS — E669 Obesity, unspecified: Secondary | ICD-10-CM | POA: Diagnosis not present

## 2015-12-18 DIAGNOSIS — E1165 Type 2 diabetes mellitus with hyperglycemia: Secondary | ICD-10-CM | POA: Diagnosis not present

## 2015-12-18 DIAGNOSIS — C73 Malignant neoplasm of thyroid gland: Secondary | ICD-10-CM | POA: Diagnosis not present

## 2015-12-18 DIAGNOSIS — G4761 Periodic limb movement disorder: Secondary | ICD-10-CM

## 2015-12-18 DIAGNOSIS — E039 Hypothyroidism, unspecified: Secondary | ICD-10-CM | POA: Diagnosis not present

## 2015-12-18 DIAGNOSIS — G4710 Hypersomnia, unspecified: Secondary | ICD-10-CM | POA: Diagnosis not present

## 2015-12-18 DIAGNOSIS — E785 Hyperlipidemia, unspecified: Secondary | ICD-10-CM | POA: Diagnosis not present

## 2015-12-18 DIAGNOSIS — E1159 Type 2 diabetes mellitus with other circulatory complications: Secondary | ICD-10-CM | POA: Diagnosis not present

## 2015-12-18 DIAGNOSIS — I10 Essential (primary) hypertension: Secondary | ICD-10-CM | POA: Diagnosis not present

## 2015-12-24 ENCOUNTER — Other Ambulatory Visit: Payer: Self-pay | Admitting: Family Medicine

## 2015-12-24 DIAGNOSIS — M549 Dorsalgia, unspecified: Secondary | ICD-10-CM

## 2015-12-26 DIAGNOSIS — E113293 Type 2 diabetes mellitus with mild nonproliferative diabetic retinopathy without macular edema, bilateral: Secondary | ICD-10-CM | POA: Diagnosis not present

## 2015-12-26 DIAGNOSIS — Z961 Presence of intraocular lens: Secondary | ICD-10-CM | POA: Diagnosis not present

## 2015-12-26 DIAGNOSIS — Z9841 Cataract extraction status, right eye: Secondary | ICD-10-CM | POA: Diagnosis not present

## 2015-12-26 DIAGNOSIS — H43811 Vitreous degeneration, right eye: Secondary | ICD-10-CM | POA: Diagnosis not present

## 2015-12-26 LAB — HM DIABETES EYE EXAM

## 2015-12-31 ENCOUNTER — Telehealth: Payer: Self-pay | Admitting: Neurology

## 2015-12-31 NOTE — Progress Notes (Signed)
194PATIENT'S NAME:  Jennifer Blackburn, Jennifer Blackburn DOB:      1945-05-21      MR#:    [id number]     DATE OF RECORDING: 12/18/2015 REFERRING M.D.:  Marylene Land Study Performed:   Baseline Polysomnogram HISTORY:  70 year old right-handed woman with an underlying medical history of anxiety, depression, type 2 diabetes, degenerative joint disease, hyperlipidemia, hypertension, thyroid cancer, coronary artery disease, status post multiple operations, including right knee surgery arthroscopic, left hip replacement surgery, cervical spine surgery, hysterectomy, cataract repair on the right, and history of obesity, who reports poor sleep consolidation, and excessive daytime somnolence. The patient endorsed the Epworth Sleepiness Scale at 4 points and the Fatigue Score at 10 points.  The patient's weight ---pounds with a height of ---5'5 (inches), resulting in a BMI of 32.28/ kg/m2. The patient's neck circumference measured 16 inches.  CURRENT MEDICATIONS: ASA, gabapentin, insulin, synthroid, Tradjenta, metformin, metoprolol, Paxil, pravachol, Accupril, verapamil, B12, vit. D.   PROCEDURE:  This is a multichannel digital polysomnogram utilizing the Somnostar 11.2 system.  Electrodes and sensors were applied and monitored per AASM Specifications.   EEG, EOG, Chin and Limb EMG, were sampled at 200 Hz.  ECG, Snore and Nasal Pressure, Thermal Airflow, Respiratory Effort, CPAP Flow and Pressure, Oximetry was sampled at 50 Hz. Digital video and audio were recorded.      BASELINE STUDY  Lights Out was at 22:16 and Lights On at 05:01.  Total recording time (TRT) was 405, with a total sleep time (TST) of 306.5 minutes.   The patient's sleep latency was 70 minutes.  REM latency was 54.5 minutes.  The sleep efficiency was 75.7 %.     SLEEP ARCHITECTURE: Wake after sleep onset was 28.5 minutes with mild sleep fragmentation noted, Stage N1 2.3%, Stage N2 81.9%, which is increased, Stage N3 11.1% and Stage R (REM sleep) 4.7%, which  is markedly decreased.   The video and audio analysis did not show any abnormal or unusual behaviors, movements, or vocalizations.   RESPIRATORY ANALYSIS:  There was a total of 0 respiratory events:  0 obstructive apneas, 0 central apneas and0 mixed apneas with a total of 0 apneas and an apnea index (AI) of 0. There were 0 hypopneas with a hypopnea index of 0. The patient also had 0 respiratory event related arousals (RERAs).      The total APNEA/HYPOPNEA INDEX (AHI) was 0 and the total RESPIRATORY DISTURBANCE INDEX was 0.  0 events occurred in REM sleep and 0 events in NREM. The REM AHI was 0, versus a non-REM AHI of 0. The patient spent 0% of total sleep time in the supine position. The supine AHI was 0.0 versus a non-supine AHI of 0.0. Mild intermittent snoring was noted.   OXYGEN SATURATION & C02:  The baseline 02 saturation was 96%, with the lowest being 92%. Time spent below 89% saturation equaled 0 minutes.   PERIODIC LIMB MOVEMENTS:   The patient had a total of 47 Periodic Limb Movements.  The Periodic Limb Movement (PLM) index was 9.2 and the PLM Arousal index was 0.   IMPRESSION:  1.  Periodic Limb Movement Disorder  2.  Dysfunctions associated with sleep stages or arousal from sleep  RECOMMENDATIONS:  1.  This study does not demonstrate any significant obstructive or central sleep disordered breathing. This study does not support an intrinsic sleep disorder as a cause of the patient's symptoms. Other causes, including circadian rhythm disturbances, an underlying mood disorder, medication effect and/or an underlying medical problem  cannot be ruled out. 2.  Mild PLMs (periodic limb movements) were noted without significant arousals; clinical correlation is recommended. Medication effect from the patient's antidepressant medication should be considered.  3.  This study shows sleep fragmentation and abnormal sleep stage percentages; these are nonspecific findings and per se do not  signify an intrinsic sleep disorder or a cause for the patient's sleep-related symptoms. Causes include (but are not limited to) the first night effect of the sleep study, circadian rhythm disturbances, medication effect or an underlying mood disorder or medical problem.  4.  The patient will be seen in follow-up by Dr. Rexene Alberts at [facility] for discussion of the test results and further management strategies. The referring provider will be notified of the test results.   I certify that I have reviewed the entire raw data recording prior to the issuance of this report in accordance with the Standards of Accreditation of the American Academy of Sleep Medicine (AASM)       Star Age, MD, PhD Diplomat, American Board of Psychiatry and Neurology  Diplomat, [place] [facility]

## 2015-12-31 NOTE — Telephone Encounter (Signed)
  Patient referred by Dr. Leta Baptist, seen by me on 11/11/15, diagnostic PSG on 12/18/15.   Please call and notify the patient that the recent sleep study did not show any significant obstructive sleep apnea. Please inform patient that I would like to go over the details of the study during a follow up appointment. Arrange a followup appointment. Also, route or fax report to PCP and referring MD, if other than PCP.  Once you have spoken to patient, you can close this encounter.   Thanks,  Star Age, MD, PhD [facility])

## 2016-01-01 ENCOUNTER — Ambulatory Visit (INDEPENDENT_AMBULATORY_CARE_PROVIDER_SITE_OTHER): Payer: Medicare Other

## 2016-01-01 VITALS — BP 126/70 | HR 62 | Resp 18 | Ht 65.0 in | Wt 193.0 lb

## 2016-01-01 DIAGNOSIS — Z0000 Encounter for general adult medical examination without abnormal findings: Secondary | ICD-10-CM | POA: Diagnosis not present

## 2016-01-01 NOTE — Patient Instructions (Signed)
Thank you for choosing [facility] for your health care needs  The Annual Wellness Visit is designed to allow [place] the chance to assist you in preserving and improving you health.   Dr. Moshe Cipro will see you back in January for your next visit  Any needed labs will be mailed to you with the date to have them drawn

## 2016-01-05 NOTE — Telephone Encounter (Signed)
I spoke to patient and they are aware of results and recommendations. I will send report to PCP. I made appt for patient in December but will call her back if any sooner openings.

## 2016-01-06 NOTE — Telephone Encounter (Signed)
I called patient to offer an appt for tomorrow but she could not take it.

## 2016-01-08 NOTE — Telephone Encounter (Signed)
I called patient back and she was able to take open appt next week.

## 2016-01-10 NOTE — Progress Notes (Signed)
Subjective:    Jennifer Blackburn is a 70 y.o. female who presents for Medicare Annual/Subsequent preventive examination.  Preventive Screening-Counseling & Management  Tobacco History  Smoking Status  . Passive Smoke Exposure - Never Smoker  Smokeless Tobacco  . Never Used     Current Problems (verified) Patient Active Problem List   Diagnosis Date Noted  . Right facial numbness 10/15/2015  . Chronic sinusitis 05/22/2015  . Type 2 diabetes mellitus with vascular disease ([place]) 01/13/2015  . GERD (gastroesophageal reflux disease) 04/11/2014  . At high risk for falls 12/31/2013  . Left anterior knee pain 11/06/2013  . Postsurgical hypothyroidism 01/05/2013  . Snoring 01/05/2013  . Back pain with radiation 11/28/2012  . TMJ arthritis 12/06/2011  . Anxiety and depression 10/16/2011  . Anemia 03/30/2011  . Vitamin D deficiency 10/14/2010  . Carotid bruit present 10/13/2010  . Obesity (BMI 30.0-34.9) 03/30/2008  . Allergic rhinitis 11/23/2007  . Hyperlipidemia 04/14/2007  . CARPAL TUNNEL SYNDROME, BILATERAL 04/14/2007  . ESSENTIAL HYPERTENSION, BENIGN 04/14/2007  . Coronary atherosclerosis 04/14/2007  . Headache 04/14/2007    Medications Prior to Visit Current Outpatient Prescriptions on File Prior to Visit  Medication Sig Dispense Refill  . aspirin EC 81 MG tablet Take 1 tablet (81 mg total) by mouth daily.    Marland Kitchen CINNAMON PO Take by mouth.    . cyclobenzaprine (FLEXERIL) 5 MG tablet Take 1 tablet (5 mg total) by mouth 3 (three) times daily as needed for muscle spasms. 30 tablet 0  . gabapentin (NEURONTIN) 100 MG capsule TAKE 1 CAPSULE(100 MG) BY MOUTH THREE TIMES DAILY 90 capsule 3  . glucose blood (ACCU-CHEK AVIVA PLUS) test strip Use as instructed four times daily dx E11.65 150 each 5  . imipramine (TOFRANIL) 25 MG tablet TAKE 4 TABLETS BY MOUTH EVERY NIGHT AT BEDTIME 120 tablet 1  . Insulin Glargine (TOUJEO SOLOSTAR) 300 UNIT/ML SOPN Inject 60 Units into the skin at  bedtime.    . Insulin Pen Needle (B-D ULTRAFINE III SHORT PEN) 31G X 8 MM MISC Use to inject insulin daily 50 each 1  . Lancets (ACCU-CHEK MULTICLIX) lancets Use as instructed three times daily dx 250.01 100 each 5  . levothyroxine (SYNTHROID) 200 MCG tablet Take 1 tablet (200 mcg total) by mouth daily before breakfast. 30 tablet 5  . linagliptin (TRADJENTA) 5 MG TABS tablet Take 5 mg by mouth daily.    Marland Kitchen loratadine (CLARITIN) 10 MG tablet Take 10 mg by mouth daily.    . metFORMIN (GLUCOPHAGE) 1000 MG tablet TAKE 1 TABLET BY MOUTH TWICE DAILY 180 tablet 0  . metoprolol (LOPRESSOR) 50 MG tablet TAKE 1 TABLET BY MOUTH TWICE DAILY 60 tablet 8  . PARoxetine (PAXIL) 20 MG tablet TAKE 1 TABLET BY MOUTH EVERY DAY 30 tablet 2  . pravastatin (PRAVACHOL) 80 MG tablet Take 1 tablet (80 mg total) by mouth daily. 90 tablet 0  . quinapril (ACCUPRIL) 10 MG tablet TAKE 1 TABLET BY MOUTH DAILY 30 tablet 1  . tiZANidine (ZANAFLEX) 4 MG capsule Take 1 capsule (4 mg total) by mouth 3 (three) times daily as needed for muscle spasms. 30 capsule 0  . verapamil (VERELAN PM) 180 MG 24 hr capsule TAKE 1 CAPSULE BY MOUTH EVERY DAY 90 capsule 3  . vitamin B-12 (CYANOCOBALAMIN) 1000 MCG tablet Take 1 tablet (1,000 mcg total) by mouth daily. 30 tablet 5  . Vitamin D, Ergocalciferol, (DRISDOL) 50000 units CAPS capsule TAKE 1 CAPSULE BY MOUTH EVERY 7 DAYS  4 capsule 6   No current facility-administered medications on file prior to visit.     Current Medications (verified) Current Outpatient Prescriptions  Medication Sig Dispense Refill  . aspirin EC 81 MG tablet Take 1 tablet (81 mg total) by mouth daily.    Marland Kitchen CINNAMON PO Take by mouth.    . cyclobenzaprine (FLEXERIL) 5 MG tablet Take 1 tablet (5 mg total) by mouth 3 (three) times daily as needed for muscle spasms. 30 tablet 0  . gabapentin (NEURONTIN) 100 MG capsule TAKE 1 CAPSULE(100 MG) BY MOUTH THREE TIMES DAILY 90 capsule 3  . glucose blood (ACCU-CHEK AVIVA PLUS) test  strip Use as instructed four times daily dx E11.65 150 each 5  . imipramine (TOFRANIL) 25 MG tablet TAKE 4 TABLETS BY MOUTH EVERY NIGHT AT BEDTIME 120 tablet 1  . Insulin Glargine (TOUJEO SOLOSTAR) 300 UNIT/ML SOPN Inject 60 Units into the skin at bedtime.    . Insulin Pen Needle (B-D ULTRAFINE III SHORT PEN) 31G X 8 MM MISC Use to inject insulin daily 50 each 1  . Lancets (ACCU-CHEK MULTICLIX) lancets Use as instructed three times daily dx 250.01 100 each 5  . levothyroxine (SYNTHROID) 200 MCG tablet Take 1 tablet (200 mcg total) by mouth daily before breakfast. 30 tablet 5  . linagliptin (TRADJENTA) 5 MG TABS tablet Take 5 mg by mouth daily.    Marland Kitchen loratadine (CLARITIN) 10 MG tablet Take 10 mg by mouth daily.    . metFORMIN (GLUCOPHAGE) 1000 MG tablet TAKE 1 TABLET BY MOUTH TWICE DAILY 180 tablet 0  . metoprolol (LOPRESSOR) 50 MG tablet TAKE 1 TABLET BY MOUTH TWICE DAILY 60 tablet 8  . PARoxetine (PAXIL) 20 MG tablet TAKE 1 TABLET BY MOUTH EVERY DAY 30 tablet 2  . pravastatin (PRAVACHOL) 80 MG tablet Take 1 tablet (80 mg total) by mouth daily. 90 tablet 0  . quinapril (ACCUPRIL) 10 MG tablet TAKE 1 TABLET BY MOUTH DAILY 30 tablet 1  . tiZANidine (ZANAFLEX) 4 MG capsule Take 1 capsule (4 mg total) by mouth 3 (three) times daily as needed for muscle spasms. 30 capsule 0  . verapamil (VERELAN PM) 180 MG 24 hr capsule TAKE 1 CAPSULE BY MOUTH EVERY DAY 90 capsule 3  . vitamin B-12 (CYANOCOBALAMIN) 1000 MCG tablet Take 1 tablet (1,000 mcg total) by mouth daily. 30 tablet 5  . Vitamin D, Ergocalciferol, (DRISDOL) 50000 units CAPS capsule TAKE 1 CAPSULE BY MOUTH EVERY 7 DAYS 4 capsule 6   No current facility-administered medications for this visit.      Allergies (verified) Sulfonamide derivatives   PAST HISTORY  Family History Family History  Problem Relation Age of Onset  . Heart attack Father   . Heart failure Mother   . Colon cancer Neg Hx     Social History Social History  Substance  Use Topics  . Smoking status: Passive Smoke Exposure - Never Smoker  . Smokeless tobacco: Never Used  . Alcohol use No     Are there smokers in your home (other than you)? No  Risk Factors Current exercise habits: The patient does not participate in regular exercise at present.  Dietary issues discussed: Limiting carbohydrates, portion sizes, upcoming changes with food labels    Cardiac risk factors: advanced age (older than 38 for men, 76 for women), diabetes mellitus, dyslipidemia and hypertension.  Depression Screen (Note: if answer to either of the following is "Yes", a more complete depression screening is indicated)   Over the  past two weeks, have you felt down, depressed or hopeless? No  Over the past two weeks, have you felt little interest or pleasure in doing things? No  Have you lost interest or pleasure in daily life? No  Do you often feel hopeless? No  Do you cry easily over simple problems? No  Activities of Daily Living In your present state of health, do you have any difficulty performing the following activities?:  Driving? No Managing money?  No Feeding yourself? No Getting from bed to chair? No Climbing a flight of stairs? Yes Preparing food and eating?: No Bathing or showering? No Getting dressed: No Getting to the toilet? No Using the toilet:No Moving around from place to place: No In the past year have you fallen or had a near fall?:Yes   Are you sexually active?  Yes  Do you have more than one partner?  No  Hearing Difficulties: No Do you often ask people to speak up or repeat themselves? No Do you experience ringing or noises in your ears? No Do you have difficulty understanding soft or whispered voices? No   Do you feel that you have a problem with memory? No  Do you often misplace items? No  Do you feel safe at home?  Yes  Cognitive Testing  Alert? Yes  Normal Appearance?Yes  Oriented to person? Yes  Place? Yes   Time? Yes  Recall of  three objects?  Yes  Can perform simple calculations? Yes  Displays appropriate judgment?Yes  Can read the correct time from a watch face?Yes   Advanced Directives have been discussed with the patient? Yes  List the Names of Other Physician/Practitioners you currently use: 1.  [facility]  2.  Dr. Ronnald Collum (endocrinology) 3.  Dr. Jorja Loa (opthamology)  4. Dr Gwenlyn Found (cardiology) Indicate any recent Medical Services you may have received from other than [facility] providers in the past year (date may be approximate).  Immunization History  Administered Date(s) Administered  . Influenza Split 03/10/2012  . Influenza Whole 01/04/2007  . Influenza,inj,Quad PF,36+ Mos 11/28/2012, 12/31/2013  . Pneumococcal Conjugate-13 07/15/2014  . Pneumococcal Polysaccharide-23 02/17/2004, 09/07/2012  . Tdap 12/21/2010    Screening Tests Health Maintenance  Topic Date Due  . Hepatitis C Screening  Feb 23, 1946  . INFLUENZA VACCINE  10/21/2015  . OPHTHALMOLOGY EXAM  10/23/2015  . HEMOGLOBIN A1C  04/21/2016  . MAMMOGRAM  05/28/2016  . FOOT EXAM  10/16/2016  . TETANUS/TDAP  12/20/2020  . COLONOSCOPY  01/16/2024  . DEXA SCAN  Completed  . ZOSTAVAX  Completed  . PNA vac Low Risk Adult  Completed    All answers were reviewed with the patient and necessary referrals were made:  Vanetta Mulders, LPN   [id number]   History reviewed: allergies, current medications, past family history, past medical history, past social history, past surgical history and problem list  Review of Systems A comprehensive review of systems was negative.    Objective:     Vision by Snellen chart: right eye:20/20, left eye:20/20  Body mass index is 32.12 kg/m. BP 126/70   Pulse 62   Resp 18   Ht 5\' 5"  (1.651 m)   Wt 193 lb 0.6 oz (87.6 kg)   SpO2 97%   BMI 32.12 kg/m   No exam performed today, annual wellness without physical exam.     Assessment:     Medicare annual wellness visit,  subsequent Annual exam as documented. Counseling done  re healthy lifestyle involving commitment to  150 minutes exercise per week, heart healthy diet, and attaining healthy weight.The importance of adequate sleep also discussed. Regular seat belt use and home safety, is also discussed. Changes in health habits are decided on by the patient with goals and time frames  set for achieving them. Immunization and cancer screening needs are specifically addressed at this visit.        Plan:     During the course of the visit the patient was educated and counseled about appropriate screening and preventive services including:    Influenza vaccine  Nutrition counseling   Diet review for nutrition referral? Yes ____  Not Indicated _x___   Patient Instructions (the written plan) was given to the patient.  Medicare Attestation I have personally reviewed: The patient's medical and social history Their use of alcohol, tobacco or illicit drugs Their current medications and supplements The patient's functional ability including ADLs,fall risks, home safety risks, cognitive, and hearing and visual impairment Diet and physical activities Evidence for depression or mood disorders  The patient's weight, height, BMI, and visual acuity have been recorded in the chart.  I have made referrals, counseling, and provided education to the patient based on review of the above and I have provided the patient with a written personalized care plan for preventive services.     Denman George [place], [place]   [id number]

## 2016-01-12 ENCOUNTER — Encounter: Payer: Self-pay | Admitting: Neurology

## 2016-01-12 ENCOUNTER — Ambulatory Visit (INDEPENDENT_AMBULATORY_CARE_PROVIDER_SITE_OTHER): Payer: Medicare Other | Admitting: Neurology

## 2016-01-12 VITALS — BP 132/60 | HR 88 | Resp 16 | Ht 65.0 in | Wt 196.0 lb

## 2016-01-12 DIAGNOSIS — G479 Sleep disorder, unspecified: Secondary | ICD-10-CM

## 2016-01-12 DIAGNOSIS — I2510 Atherosclerotic heart disease of native coronary artery without angina pectoris: Secondary | ICD-10-CM

## 2016-01-12 NOTE — Patient Instructions (Signed)
Your sleep study did not show any significant obstructive sleep apnea.  You had very little leg twitching in sleep, which did not disturb your sleep very much.  You can be monitored for restless legs symptoms. Keep in mind restless legs syndrome (RLS) is associated with anemia and iron deficiency.  Please remember to try to maintain good sleep hygiene, which means: Keep a regular sleep and wake schedule, try not to exercise or have a meal within 2 hours of your bedtime, try to keep your bedroom conducive for sleep, that is, cool and dark, without light distractors such as an illuminated alarm clock, and refrain from watching TV right before sleep or in the middle of the night and do not keep the TV or radio on during the night. Also, try not to use or play on electronic devices at bedtime, such as your cell phone, tablet PC or laptop. If you like to read at bedtime on an electronic device, try to dim the background light as much as possible. Do not eat in the middle of the night.   I will see you back as needed. Keep your follow up with Dr. Leta Baptist.

## 2016-01-12 NOTE — Progress Notes (Signed)
Subjective:    Patient ID: Jennifer Blackburn is a 70 y.o. female.  HPI     Interim history:   Jennifer Blackburn is a 70 year old right-handed woman with an underlying medical history of anxiety, depression, type 2 diabetes, degenerative joint disease, hyperlipidemia, hypertension, thyroid cancer, coronary artery disease, status post multiple operations, including right knee surgery arthroscopic, left hip replacement surgery, cervical spine surgery, hysterectomy, cataract repair on the right, and history of obesity, who presents for follow-up consultation after her recent sleep study. The patient is unaccompanied today. I first met her on 11/11/2015 at the request of Dr. Leta Baptist, at which time the patient reported difficulty with sleep consolidation, daytime somnolence. She had a recent concern for TIA. I invited her for sleep study. She had a baseline sleep study on 12/18/2015. I went over her test results with her in detail today. Her sleep latency was elongated at 70 minutes, REM latency was 54.5 minutes, mildly reduced. Sleep efficiency was mildly reduced at 75.7%. She had an increased percentage of stage II sleep, slow-wave sleep at 11.1% and very little REM sleep at 4.7%. She had no significant respiratory events and mild intermittent snoring was noted, total AHI was 0 per hour. Average oxygen saturation was 96%, nadir was 92%. She had borderline PLMS with an index of 9.2 per hour, PLM associated arousal index was 0 per hour.  Today, 01/12/2016: She reports doing okay, no new symptoms, no new neurological Sx, sleeps okay, rare and mild RLS symptoms, not bothersome. She saw Dr. Leta Baptist in follow-up on 12/09/2015 and I reviewed the note. She had her echo on 12/16/15 and I reviewed the results with her, interpreted by Dr. Meda Coffee. Normal wall motion, mild LVH, no valvular disease. EF was up a little at 65-70%. She feels that she slept reasonably well during the sleep study. Has good family support, no recent  neurological symptoms, has been on Paxil for years. Typical bedtime at home is closer to midnight so sleep study started to early for her. She watch TV until about 11:30 she reports but still felt not very sleepy, but generally did well with sleep since she feels.  Previously:  11/11/2015: She reports poor sleep consolidation, and excessive daytime somnolence. She is a I reviewed your office note from 10/27/2015, there was concern for possible TIA, has had intermittent R facial numbness and she went to [facility] in July for this, then ER.  W/u thus far has included MRI brain, but results are not available for my review. She had a C. Doppler on 10/29/15: IMPRESSION: Mild atherosclerotic disease in the carotid arteries. No significant carotid artery stenosis. Estimated degree of stenosis in the internal carotid arteries is less than 50% bilaterally. Patent vertebral arteries. She has occasional AM HAs, no nightly nocturia, had RLS Sx off and on.  She reports stress.  She has one son and one daughter.  She has 4 granddaughters.  She is a non-smoker but is exposed to secondhand smoke at the house because of her husband smoking. Her husband has obstructive sleep apnea and uses a CPAP machine, if he falls asleep without it his snoring will disturb her. They tend to watch TV in bed but turn the TV off before falling asleep. Bedtime is generally between 11:30 and midnight for her and wake up time is 9 AM, she wakes up fairly adequately rested typically, Epworth sleepiness score is 4 out of 24 today, fatigue score is 10 out of 63. She used to have intermittent restless  leg symptoms, not so much lately. She is retired from [employer] for 32 years.  Her Past Medical History Is Significant For: Past Medical History:  Diagnosis Date  . Anxiety   . CAD (coronary artery disease)   . Depression   . Diabetes mellitus type II    without complication  . DJD (degenerative joint disease) of lumbar spine   .  Hypercholesteremia   . Hyperlipidemia   . Hypertension    benign   . Thyroid cancer [facility]) 2001    Her Past Surgical History Is Significant For: Past Surgical History:  Procedure Laterality Date  . ABDOMINAL HYSTERECTOMY    . CARDIAC CATHETERIZATION    . CATARACT EXTRACTION W/PHACO Right 07/17/2012   Procedure: CATARACT EXTRACTION PHACO AND INTRAOCULAR LENS PLACEMENT (IOC);  Surgeon: Tonny Branch, MD;  Location: [facility] ORS;  Service: Ophthalmology;  Laterality: Right;  CDE:25.51  . COLONOSCOPY    . COLONOSCOPY N/A 01/15/2014   Procedure: COLONOSCOPY;  Surgeon: Daneil Dolin, MD;  Location: [facility] ENDO SUITE;  Service: Endoscopy;  Laterality: N/A;  9:00 AM  . DOPPLER ECHOCARDIOGRAPHY    . JOINT REPLACEMENT  07/01/2010   left hip  . KNEE SURGERY Right    arthroscopy  . left hip replaced  07/01/2010  . SPINE SURGERY  2006   cervical  . stress dipyridamole myocardial perfusion    . THYROIDECTOMY    . TONSILLECTOMY    . VESICOVAGINAL FISTULA CLOSURE W/ TAH      Her Family History Is Significant For: Family History  Problem Relation Age of Onset  . Heart attack Father   . Heart failure Mother   . Colon cancer Neg Hx     Her Social History Is Significant For: Social History   Social History  . Marital status: Married    Spouse name: N/A  . Number of children: 2  . Years of education: 14   Occupational History  . retired      Merchandiser, retail   Social History Main Topics  . Smoking status: Passive Smoke Exposure - Never Smoker  . Smokeless tobacco: Never Used  . Alcohol use No  . Drug use: No  . Sexual activity: Not Asked   Other Topics Concern  . None   Social History Narrative   Lives with husband, at home   No caffeine    Her Allergies Are:  Allergies  Allergen Reactions  . Sulfonamide Derivatives Hives  :   Her Current Medications Are:  Outpatient Encounter Prescriptions as of 01/12/2016  Medication Sig  . aspirin EC 81 MG tablet Take 1 tablet (81 mg total) by mouth  daily.  Marland Kitchen CINNAMON PO Take by mouth.  . gabapentin (NEURONTIN) 100 MG capsule TAKE 1 CAPSULE(100 MG) BY MOUTH THREE TIMES DAILY  . gemfibrozil (LOPID) 600 MG tablet   . glucose blood (ACCU-CHEK AVIVA PLUS) test strip Use as instructed four times daily dx E11.65  . imipramine (TOFRANIL) 25 MG tablet TAKE 4 TABLETS BY MOUTH EVERY NIGHT AT BEDTIME  . Insulin Glargine (TOUJEO SOLOSTAR) 300 UNIT/ML SOPN Inject 60 Units into the skin at bedtime.  . Insulin Pen Needle (B-D ULTRAFINE III SHORT PEN) 31G X 8 MM MISC Use to inject insulin daily  . Lancets (ACCU-CHEK MULTICLIX) lancets Use as instructed three times daily dx 250.01  . levothyroxine (SYNTHROID) 200 MCG tablet Take 1 tablet (200 mcg total) by mouth daily before breakfast.  . linagliptin (TRADJENTA) 5 MG TABS tablet Take 5 mg by mouth daily.  Marland Kitchen  loratadine (CLARITIN) 10 MG tablet Take 10 mg by mouth daily.  . metFORMIN (GLUCOPHAGE) 1000 MG tablet TAKE 1 TABLET BY MOUTH TWICE DAILY  . metoprolol (LOPRESSOR) 50 MG tablet TAKE 1 TABLET BY MOUTH TWICE DAILY  . PARoxetine (PAXIL) 20 MG tablet TAKE 1 TABLET BY MOUTH EVERY DAY  . pravastatin (PRAVACHOL) 80 MG tablet Take 1 tablet (80 mg total) by mouth daily.  . quinapril (ACCUPRIL) 10 MG tablet TAKE 1 TABLET BY MOUTH DAILY  . tiZANidine (ZANAFLEX) 4 MG capsule Take 1 capsule (4 mg total) by mouth 3 (three) times daily as needed for muscle spasms.  . verapamil (VERELAN PM) 180 MG 24 hr capsule TAKE 1 CAPSULE BY MOUTH EVERY DAY  . vitamin B-12 (CYANOCOBALAMIN) 1000 MCG tablet Take 1 tablet (1,000 mcg total) by mouth daily.  . Vitamin D, Ergocalciferol, (DRISDOL) 50000 units CAPS capsule TAKE 1 CAPSULE BY MOUTH EVERY 7 DAYS  . [DISCONTINUED] cyclobenzaprine (FLEXERIL) 5 MG tablet Take 1 tablet (5 mg total) by mouth 3 (three) times daily as needed for muscle spasms.   No facility-administered encounter medications on file as of 01/12/2016.   : Review of Systems:  Out of a complete 14 point review of  systems, all are reviewed and negative with the exception of these symptoms as listed below:Evaluating all symptoms are Review of Systems  Neurological:       Patient is here to discuss results of sleep study. No new concerns.     Objective:  Neurologic Exam  Physical Exam Physical Examination:   Vitals:   01/12/16 1507  BP: 132/60  Pulse: 88  Resp: 16    General Examination: The patient is a very pleasant 70 y.o. female in no acute distress. She appears well-developed and well-nourished and very well groomed. She is in good spirits today.   HEENT: Normocephalic, atraumatic, pupils are equal, round and reactive to light and accommodation. Extraocular tracking is good without limitation to gaze excursion or nystagmus noted. Normal smooth pursuit is noted. Hearing is grossly intact. Face is symmetric with normal facial animation and normal facial sensation. Speech is clear with no dysarthria noted. There is no hypophonia. There is no lip, neck/head, jaw or voice tremor. Neck is supple with full range of passive and active motion. There are no carotid bruits on auscultation. Oropharynx exam reveals: mild mouth dryness, adequate dental hygiene and mild airway crowding, due to smaller airway. Mallampati is class II. Tongue protrudes centrally and palate elevates symmetrically. Tonsils are absent.    Chest: Clear to auscultation without wheezing, rhonchi or crackles noted.  Heart: S1+S2+0, regular and normal without murmurs, rubs or gallops noted.   Abdomen: Soft, non-tender and non-distended with normal bowel sounds appreciated on auscultation.  Extremities: There is no pitting edema in the distal lower extremities bilaterally. Pedal pulses are intact.  Skin: Warm and dry without trophic changes noted. There are no varicose veins.  Musculoskeletal: exam reveals no obvious joint deformities, tenderness or joint swelling or erythema.   Neurologically:  Mental status: The patient is  awake, alert and oriented in all 4 spheres. Her immediate and remote memory, attention, language skills and fund of knowledge are appropriate. There is no evidence of aphasia, agnosia, apraxia or anomia. Speech is clear with normal prosody and enunciation. Thought process is linear. Mood is normal and affect is normal.  Cranial nerves II - XII are as described above under HEENT exam. In addition: shoulder shrug is normal with equal shoulder height noted. Motor exam:  Normal bulk, strength and tone is noted. There is no drift, tremor or rebound. Romberg is negative. Reflexes are 2+ throughout. Babinski: Toes are flexor bilaterally. Fine motor skills and coordination: intact with normal finger taps, normal hand movements, normal rapid alternating patting, normal foot taps and normal foot agility.  Cerebellar testing: No dysmetria or intention tremor on finger to nose testing. Heel to shin is unremarkable bilaterally. There is no truncal or gait ataxia.  Sensory exam: intact to light touch in the upper and lower extremities.  Gait, station and balance: She stands easily. No veering to one side is noted. No leaning to one side is noted. Posture is age-appropriate and stance is narrow based. Gait shows normal stride length and normal pace. No problems turning are noted. Tandem walk is unremarkable.  Assessment and Plan:  In summary, Jennifer Blackburn is a very pleasant 70 year old female with an underlying medical history of anxiety, depression, type 2 diabetes, degenerative joint disease, hyperlipidemia, hypertension, thyroid cancer, coronary artery disease, status post multiple operations, including right knee surgery arthroscopic, left hip replacement surgery, cervical spine surgery, hysterectomy, cataract repair on the right, and history of obesity, who Presents for follow-up consultation after her recent sleep study. She had a baseline sleep study at the end of September 2017 which showed no sleep disordered  breathing and preserved oxygen saturations throughout the night. She had some sleep disruption and delay in sleep onset but typically at home goes to bed around midnight so her study started to early for her. Otherwise, she had increase in light stage sleep and very little REM sleep which in part could be medication effect. She had very little PLMS but no arousals, has occasional restless leg symptoms but these are not bothersome to her at this time. Physical exam and neurological exam are nonfocal, she recently had her echocardiogram which we reviewed together in showed benign results, she was reassured. At this juncture, I suggested as needed follow-up with me. She was reminded of maintaining good sleep hygiene.  She does not have an appointment pending with Dr. Leta Baptist and is encouraged to make one if she needs one. I answered all her questions today and she was in agreement. I spent 25 minutes in total face-to-face time with the patient, more than 50% of which was spent in counseling and coordination of care, reviewing test results, reviewing medication and discussing or reviewing the diagnosis of sleep disturbance, its prognosis and treatment options.

## 2016-01-13 ENCOUNTER — Telehealth: Payer: Self-pay | Admitting: Family Medicine

## 2016-01-13 DIAGNOSIS — I10 Essential (primary) hypertension: Secondary | ICD-10-CM

## 2016-01-13 DIAGNOSIS — E1159 Type 2 diabetes mellitus with other circulatory complications: Secondary | ICD-10-CM

## 2016-01-13 DIAGNOSIS — E785 Hyperlipidemia, unspecified: Secondary | ICD-10-CM

## 2016-01-13 DIAGNOSIS — E559 Vitamin D deficiency, unspecified: Secondary | ICD-10-CM

## 2016-01-13 NOTE — Telephone Encounter (Signed)
pls let pt know I encourage her to take the flu vaccine, see that she has not had it yet Also needs fasting lipid, cmp and EGFr and Vit D and CBC for Jan appt, cholesterol was high in August when Dr Carmela Rima checked it, thanks, pls order

## 2016-01-13 NOTE — Assessment & Plan Note (Signed)

## 2016-01-14 ENCOUNTER — Other Ambulatory Visit: Payer: Self-pay | Admitting: Family Medicine

## 2016-01-20 DIAGNOSIS — I10 Essential (primary) hypertension: Secondary | ICD-10-CM | POA: Diagnosis not present

## 2016-01-20 DIAGNOSIS — E1159 Type 2 diabetes mellitus with other circulatory complications: Secondary | ICD-10-CM | POA: Diagnosis not present

## 2016-01-20 DIAGNOSIS — E039 Hypothyroidism, unspecified: Secondary | ICD-10-CM | POA: Diagnosis not present

## 2016-01-20 DIAGNOSIS — E785 Hyperlipidemia, unspecified: Secondary | ICD-10-CM | POA: Diagnosis not present

## 2016-01-20 DIAGNOSIS — C73 Malignant neoplasm of thyroid gland: Secondary | ICD-10-CM | POA: Diagnosis not present

## 2016-01-20 DIAGNOSIS — E1165 Type 2 diabetes mellitus with hyperglycemia: Secondary | ICD-10-CM | POA: Diagnosis not present

## 2016-01-20 DIAGNOSIS — E669 Obesity, unspecified: Secondary | ICD-10-CM | POA: Diagnosis not present

## 2016-01-23 NOTE — Telephone Encounter (Signed)
Lab order mailed to her to do before jan appt

## 2016-01-23 NOTE — Addendum Note (Signed)
Addended by: Eual Fines on: 01/23/2016 08:25 AM   Modules accepted: Orders

## 2016-01-26 ENCOUNTER — Other Ambulatory Visit: Payer: Self-pay | Admitting: Family Medicine

## 2016-02-03 DIAGNOSIS — E669 Obesity, unspecified: Secondary | ICD-10-CM | POA: Diagnosis not present

## 2016-02-03 DIAGNOSIS — C73 Malignant neoplasm of thyroid gland: Secondary | ICD-10-CM | POA: Diagnosis not present

## 2016-02-03 DIAGNOSIS — E1159 Type 2 diabetes mellitus with other circulatory complications: Secondary | ICD-10-CM | POA: Diagnosis not present

## 2016-02-03 DIAGNOSIS — E785 Hyperlipidemia, unspecified: Secondary | ICD-10-CM | POA: Diagnosis not present

## 2016-02-03 DIAGNOSIS — I10 Essential (primary) hypertension: Secondary | ICD-10-CM | POA: Diagnosis not present

## 2016-02-03 DIAGNOSIS — E1165 Type 2 diabetes mellitus with hyperglycemia: Secondary | ICD-10-CM | POA: Diagnosis not present

## 2016-02-03 DIAGNOSIS — E039 Hypothyroidism, unspecified: Secondary | ICD-10-CM | POA: Diagnosis not present

## 2016-02-09 ENCOUNTER — Encounter: Payer: Self-pay | Admitting: Family Medicine

## 2016-02-09 ENCOUNTER — Ambulatory Visit (INDEPENDENT_AMBULATORY_CARE_PROVIDER_SITE_OTHER): Payer: Medicare Other | Admitting: Family Medicine

## 2016-02-09 VITALS — BP 118/72 | HR 82 | Temp 98.8°F | Resp 16 | Ht 65.0 in | Wt 195.0 lb

## 2016-02-09 DIAGNOSIS — J0120 Acute ethmoidal sinusitis, unspecified: Secondary | ICD-10-CM

## 2016-02-09 DIAGNOSIS — I2510 Atherosclerotic heart disease of native coronary artery without angina pectoris: Secondary | ICD-10-CM

## 2016-02-09 MED ORDER — AMOXICILLIN 875 MG PO TABS: 875.0000 mg | ORAL_TABLET | Freq: Two times a day (BID) | ORAL | 0 refills | Status: DC

## 2016-02-09 MED ORDER — FLUTICASONE PROPIONATE 50 MCG/ACT NA SUSP: 2.0000 | Freq: Every day | NASAL | 6 refills | Status: DC

## 2016-02-09 NOTE — Patient Instructions (Signed)
Drink plenty of water Use the flonase twice a day until symptoms improve Take 10 days of antibiotics Call if not improving in a few days

## 2016-02-09 NOTE — Progress Notes (Signed)
Chief Complaint  Patient presents with  . Headache    headache in the forehead area and also has been having a sharp pain in the back left side of her head that shoots upward. Has been taking loratadine but its not helping   . Sinusitis    yellow sinus drainage going on for awhile now. Every am when she wakes up she has to clear it out of her throat  . Fatigue    c/o feeling tired all the time    Nursing note and above is reviewed Patient states for about 2 weeks she has had sinus pressure and pain, headaches, postnasal drip that is thick and yellow, coughing especially in the morning. She has headaches. No fever or chills. Some nausea but no vomiting. She's been taking loratadine with no help. She is using some Ocean spray nasal rinse. Not consistent. No dizzy spells. No falls or trauma. No sore throat or ear pain. No chest congestion or wheezing. She does feel tired.  Patient Active Problem List   Diagnosis Date Noted  . Right facial numbness 10/15/2015  . Type 2 diabetes mellitus with vascular disease ([place]) 01/13/2015  . GERD (gastroesophageal reflux disease) 04/11/2014  . Medicare annual wellness visit, subsequent 12/31/2013  . At high risk for falls 12/31/2013  . Left anterior knee pain 11/06/2013  . Postsurgical hypothyroidism 01/05/2013  . Snoring 01/05/2013  . Back pain with radiation 11/28/2012  . TMJ arthritis 12/06/2011  . Anxiety and depression 10/16/2011  . Anemia 03/30/2011  . Vitamin D deficiency 10/14/2010  . Carotid bruit present 10/13/2010  . Obesity (BMI 30.0-34.9) 03/30/2008  . Allergic rhinitis 11/23/2007  . Hyperlipidemia 04/14/2007  . CARPAL TUNNEL SYNDROME, BILATERAL 04/14/2007  . ESSENTIAL HYPERTENSION, BENIGN 04/14/2007  . Coronary atherosclerosis 04/14/2007  . Headache 04/14/2007    Outpatient Encounter Prescriptions as of 02/09/2016  Medication Sig  . amoxicillin (AMOXIL) 875 MG tablet Take 1 tablet (875 mg total) by mouth 2 (two) times daily.    Marland Kitchen aspirin EC 81 MG tablet Take 1 tablet (81 mg total) by mouth daily.  Marland Kitchen CINNAMON PO Take by mouth.  . fluticasone (FLONASE) 50 MCG/ACT nasal spray Place 2 sprays into both nostrils daily.  Marland Kitchen gabapentin (NEURONTIN) 100 MG capsule TAKE 1 CAPSULE(100 MG) BY MOUTH THREE TIMES DAILY  . gemfibrozil (LOPID) 600 MG tablet   . glucose blood (ACCU-CHEK AVIVA PLUS) test strip Use as instructed four times daily dx E11.65  . imipramine (TOFRANIL) 25 MG tablet TAKE 4 TABLETS BY MOUTH EVERY NIGHT AT BEDTIME  . Insulin Glargine (TOUJEO SOLOSTAR) 300 UNIT/ML SOPN Inject 60 Units into the skin at bedtime.  . Insulin Pen Needle (B-D ULTRAFINE III SHORT PEN) 31G X 8 MM MISC Use to inject insulin daily  . Lancets (ACCU-CHEK MULTICLIX) lancets Use as instructed three times daily dx 250.01  . levothyroxine (SYNTHROID) 200 MCG tablet Take 1 tablet (200 mcg total) by mouth daily before breakfast.  . linagliptin (TRADJENTA) 5 MG TABS tablet Take 5 mg by mouth daily.  Marland Kitchen loratadine (CLARITIN) 10 MG tablet Take 10 mg by mouth daily.  . metFORMIN (GLUCOPHAGE) 1000 MG tablet TAKE 1 TABLET BY MOUTH TWICE DAILY  . metoprolol (LOPRESSOR) 50 MG tablet TAKE 1 TABLET BY MOUTH TWICE DAILY  . PARoxetine (PAXIL) 20 MG tablet TAKE 1 TABLET BY MOUTH EVERY DAY  . pravastatin (PRAVACHOL) 80 MG tablet Take 1 tablet (80 mg total) by mouth daily.  . quinapril (ACCUPRIL) 10 MG tablet  TAKE 1 TABLET BY MOUTH DAILY  . tiZANidine (ZANAFLEX) 4 MG capsule Take 1 capsule (4 mg total) by mouth 3 (three) times daily as needed for muscle spasms.  . verapamil (VERELAN PM) 180 MG 24 hr capsule TAKE 1 CAPSULE BY MOUTH EVERY DAY  . vitamin B-12 (CYANOCOBALAMIN) 1000 MCG tablet Take 1 tablet (1,000 mcg total) by mouth daily.  . Vitamin D, Ergocalciferol, (DRISDOL) 50000 units CAPS capsule TAKE 1 CAPSULE BY MOUTH EVERY 7 DAYS   No facility-administered encounter medications on file as of 02/09/2016.     Allergies  Allergen Reactions  . Sulfonamide  Derivatives Hives    Review of Systems  Constitutional: Positive for fatigue. Negative for activity change, appetite change, chills and fever.  HENT: Positive for congestion, postnasal drip, rhinorrhea, sinus pain and sinus pressure. Negative for ear pain, sore throat, trouble swallowing and voice change.   Eyes: Negative for redness and visual disturbance.  Respiratory: Positive for cough. Negative for shortness of breath and wheezing.   Cardiovascular: Negative for chest pain, palpitations and leg swelling.  Gastrointestinal: Positive for nausea. Negative for constipation, diarrhea and vomiting.  Genitourinary: Negative for difficulty urinating.  Musculoskeletal: Positive for back pain.       Chronic back pain  Neurological: Positive for headaches. Negative for dizziness.  Psychiatric/Behavioral: Negative for sleep disturbance. The patient is not nervous/anxious.     BP 118/72   Pulse 82   Temp 98.8 F (37.1 C) (Oral)   Resp 16   Ht 5\' 5"  (1.651 m)   Wt 195 lb (88.5 kg)   SpO2 100%   BMI 32.45 kg/m   Physical Exam  Constitutional: She is oriented to person, place, and time. She appears well-developed and well-nourished. No distress.  HENT:  Head: Normocephalic and atraumatic.  Right Ear: External ear normal.  Left Ear: External ear normal.  Nose: Nose normal.  Mouth/Throat: Oropharynx is clear and moist. No oropharyngeal exudate.  Posterior pharynx slightly injected. Facial sinuses are tender throughout  Eyes: Conjunctivae are normal. Pupils are equal, round, and reactive to light.  Neck: Normal range of motion. No thyromegaly present.  Cardiovascular: Normal rate, regular rhythm and normal heart sounds.   Pulmonary/Chest: Effort normal and breath sounds normal. No respiratory distress. She has no wheezes.  Abdominal: Soft. Bowel sounds are normal.  Musculoskeletal: Normal range of motion. She exhibits no edema.  Lymphadenopathy:    She has no cervical adenopathy.    Neurological: She is alert and oriented to person, place, and time.  Psychiatric: She has a normal mood and affect. Her behavior is normal. Thought content normal.    ASSESSMENT/PLAN:  1. Acute non-recurrent ethmoidal sinusitis    Patient Instructions  Drink plenty of water Use the flonase twice a day until symptoms improve Take 10 days of antibiotics Call if not improving in a few days   Raylene Everts, MD

## 2016-02-10 ENCOUNTER — Other Ambulatory Visit: Payer: Self-pay | Admitting: Family Medicine

## 2016-02-16 ENCOUNTER — Other Ambulatory Visit: Payer: Self-pay | Admitting: Family Medicine

## 2016-02-24 DIAGNOSIS — E039 Hypothyroidism, unspecified: Secondary | ICD-10-CM | POA: Diagnosis not present

## 2016-02-24 DIAGNOSIS — E785 Hyperlipidemia, unspecified: Secondary | ICD-10-CM | POA: Diagnosis not present

## 2016-02-24 DIAGNOSIS — I10 Essential (primary) hypertension: Secondary | ICD-10-CM | POA: Diagnosis not present

## 2016-02-24 DIAGNOSIS — C73 Malignant neoplasm of thyroid gland: Secondary | ICD-10-CM | POA: Diagnosis not present

## 2016-02-24 DIAGNOSIS — E669 Obesity, unspecified: Secondary | ICD-10-CM | POA: Diagnosis not present

## 2016-02-24 DIAGNOSIS — E1165 Type 2 diabetes mellitus with hyperglycemia: Secondary | ICD-10-CM | POA: Diagnosis not present

## 2016-02-24 DIAGNOSIS — E1159 Type 2 diabetes mellitus with other circulatory complications: Secondary | ICD-10-CM | POA: Diagnosis not present

## 2016-03-02 ENCOUNTER — Ambulatory Visit: Payer: Medicare Other | Admitting: Cardiovascular Disease

## 2016-03-09 ENCOUNTER — Ambulatory Visit: Payer: Medicare Other | Admitting: Cardiovascular Disease

## 2016-03-13 DIAGNOSIS — Z23 Encounter for immunization: Secondary | ICD-10-CM | POA: Diagnosis not present

## 2016-03-18 ENCOUNTER — Ambulatory Visit: Payer: Self-pay | Admitting: Neurology

## 2016-03-20 ENCOUNTER — Other Ambulatory Visit: Payer: Self-pay | Admitting: Family Medicine

## 2016-03-23 ENCOUNTER — Encounter: Payer: Self-pay | Admitting: Family Medicine

## 2016-03-23 ENCOUNTER — Ambulatory Visit (INDEPENDENT_AMBULATORY_CARE_PROVIDER_SITE_OTHER): Payer: Medicare Other | Admitting: Family Medicine

## 2016-03-23 VITALS — BP 126/70 | HR 88 | Temp 98.1°F | Resp 18 | Ht 65.0 in | Wt 194.1 lb

## 2016-03-23 DIAGNOSIS — R252 Cramp and spasm: Secondary | ICD-10-CM

## 2016-03-23 DIAGNOSIS — E559 Vitamin D deficiency, unspecified: Secondary | ICD-10-CM | POA: Diagnosis not present

## 2016-03-23 DIAGNOSIS — E1159 Type 2 diabetes mellitus with other circulatory complications: Secondary | ICD-10-CM | POA: Diagnosis not present

## 2016-03-23 DIAGNOSIS — E890 Postprocedural hypothyroidism: Secondary | ICD-10-CM

## 2016-03-23 NOTE — Patient Instructions (Signed)
Go for lab tests today I will send you a letter with your test results.  If there is anything of concern, we will call right away. Call the neurologist tomorrow if the face numbness persists Drink more water  Call if not improved in a few days

## 2016-03-23 NOTE — Progress Notes (Signed)
Chief Complaint  Patient presents with  . Leg Pain   Patient called requesting an acute visit. She is normally a patient of Dr. Tula Nakayama. She states that she has been under increased stress lately because of illness in her family This episode started with severe "charley horse" cramps in both legs that awakened her Saturday night. She states that it took almost an hour to resolve. The next day her legs were sore. It has not happened again. She had been doing increased walking and activity the day prior to the cramps. She states she is not good about drinking water and may have been dehydrated. She did have a sleep study that showed she might have borderline restless leg syndrome. She states that because of leg pain and concern, her right face numbness has come back. She states this first started in July 2017, she was referred to a neurologist, but her workup was negative. She had a brain MRI, carotid ultrasounds, echocardiogram, and lab studies that were all reassuring. Now in addition to the right face being numb, her right hand has had some numbness in a glove distribution and she worries about having a stroke. She has not had any change in her cognition or memory, no weakness in her arms and legs, no falls, no head injury, no recent infection. She has poorly controlled diabetes. I do discuss with her that diabetics have polyuria which sometimes will cause electrolyte imbalance. If she doesn't keep up with her water intake this could contribute to muscle cramping. Regular walking, and stretching will help her legs.  Patient Active Problem List   Diagnosis Date Noted  . Right facial numbness 10/15/2015  . Type 2 diabetes mellitus with vascular disease ([place]) 01/13/2015  . GERD (gastroesophageal reflux disease) 04/11/2014  . Medicare annual wellness visit, subsequent 12/31/2013  . At high risk for falls 12/31/2013  . Left anterior knee pain 11/06/2013  . Postsurgical hypothyroidism  01/05/2013  . Snoring 01/05/2013  . Back pain with radiation 11/28/2012  . TMJ arthritis 12/06/2011  . Anxiety and depression 10/16/2011  . Anemia 03/30/2011  . Vitamin D deficiency 10/14/2010  . Carotid bruit present 10/13/2010  . Obesity (BMI 30.0-34.9) 03/30/2008  . Allergic rhinitis 11/23/2007  . Hyperlipidemia 04/14/2007  . CARPAL TUNNEL SYNDROME, BILATERAL 04/14/2007  . ESSENTIAL HYPERTENSION, BENIGN 04/14/2007  . Coronary atherosclerosis 04/14/2007  . Headache 04/14/2007    Outpatient Encounter Prescriptions as of 03/23/2016  Medication Sig  . amoxicillin (AMOXIL) 875 MG tablet Take 1 tablet (875 mg total) by mouth 2 (two) times daily.  Marland Kitchen aspirin EC 81 MG tablet Take 1 tablet (81 mg total) by mouth daily.  Marland Kitchen CINNAMON PO Take by mouth.  . fluticasone (FLONASE) 50 MCG/ACT nasal spray Place 2 sprays into both nostrils daily.  Marland Kitchen gabapentin (NEURONTIN) 100 MG capsule TAKE 1 CAPSULE(100 MG) BY MOUTH THREE TIMES DAILY  . gemfibrozil (LOPID) 600 MG tablet   . glucose blood (ACCU-CHEK AVIVA PLUS) test strip Use as instructed four times daily dx E11.65  . imipramine (TOFRANIL) 25 MG tablet TAKE 4 TABLETS BY MOUTH EVERY NIGHT AT BEDTIME  . Insulin Glargine (TOUJEO SOLOSTAR) 300 UNIT/ML SOPN Inject 60 Units into the skin at bedtime.  . Insulin Pen Needle (B-D ULTRAFINE III SHORT PEN) 31G X 8 MM MISC Use to inject insulin daily  . Lancets (ACCU-CHEK MULTICLIX) lancets Use as instructed three times daily dx 250.01  . levothyroxine (SYNTHROID) 200 MCG tablet Take 1 tablet (200 mcg total)  by mouth daily before breakfast.  . linagliptin (TRADJENTA) 5 MG TABS tablet Take 5 mg by mouth daily.  Marland Kitchen loratadine (CLARITIN) 10 MG tablet Take 10 mg by mouth daily.  . metFORMIN (GLUCOPHAGE) 1000 MG tablet TAKE 1 TABLET BY MOUTH TWICE DAILY  . metoprolol (LOPRESSOR) 50 MG tablet TAKE 1 TABLET BY MOUTH TWICE DAILY  . PARoxetine (PAXIL) 20 MG tablet TAKE 1 TABLET BY MOUTH EVERY DAY  . pravastatin  (PRAVACHOL) 80 MG tablet TAKE 1 TABLET BY MOUTH DAILY  . quinapril (ACCUPRIL) 10 MG tablet TAKE 1 TABLET BY MOUTH DAILY  . tiZANidine (ZANAFLEX) 4 MG capsule Take 1 capsule (4 mg total) by mouth 3 (three) times daily as needed for muscle spasms.  . verapamil (VERELAN PM) 180 MG 24 hr capsule TAKE 1 CAPSULE BY MOUTH EVERY DAY  . vitamin B-12 (CYANOCOBALAMIN) 1000 MCG tablet Take 1 tablet (1,000 mcg total) by mouth daily.  . Vitamin D, Ergocalciferol, (DRISDOL) 50000 units CAPS capsule TAKE 1 CAPSULE BY MOUTH EVERY 7 DAYS   No facility-administered encounter medications on file as of 03/23/2016.     Allergies  Allergen Reactions  . Sulfonamide Derivatives Hives   Review of Systems  Constitutional: Negative for activity change, appetite change and fatigue.  HENT: Negative for congestion, sinus pain and sinus pressure.   Eyes: Negative for photophobia and visual disturbance.  Respiratory: Negative for cough and shortness of breath.   Cardiovascular: Negative for chest pain, palpitations and leg swelling.  Gastrointestinal: Negative for constipation, diarrhea and nausea.  Genitourinary: Negative for difficulty urinating and frequency.  Musculoskeletal:       Muscle cramps in legs  Neurological: Positive for numbness. Negative for facial asymmetry, speech difficulty and weakness.  Psychiatric/Behavioral: Negative for dysphoric mood. The patient is nervous/anxious.    BP 126/70 (BP Location: Right Arm, Patient Position: Sitting, Cuff Size: Normal)   Pulse 88   Temp 98.1 F (36.7 C) (Oral)   Resp 18   Ht 5\' 5"  (1.651 m)   Wt 194 lb 1.9 oz (88.1 kg)   SpO2 98%   BMI 32.30 kg/m   Physical Exam  Constitutional: She is oriented to person, place, and time. She appears well-developed and well-nourished. No distress.  Well-groomed. No distress. Appears concerned.  HENT:  Head: Normocephalic and atraumatic.  Right Ear: External ear normal.  Left Ear: External ear normal.  Nose: Nose  normal.  Mouth/Throat: Oropharynx is clear and moist.  No face asymmetry  Eyes: EOM are normal. Pupils are equal, round, and reactive to light.  Neck: Normal range of motion. Neck supple. No thyromegaly present.  Cardiovascular: Normal rate, regular rhythm and normal heart sounds.   Pulmonary/Chest: Effort normal and breath sounds normal. No respiratory distress.  Musculoskeletal: Normal range of motion. She exhibits no edema.  Strength, sensation, range of motion, reflexes  intact in all extremities. Trigger fingers.  Lymphadenopathy:    She has no cervical adenopathy.  Neurological: She is alert and oriented to person, place, and time. She displays normal reflexes. No cranial nerve deficit.  Psychiatric: She has a normal mood and affect. Her behavior is normal.  Mild anxiety    ASSESSMENT/PLAN:  1. Leg cramping * I have ordered lab work to look for electrolyte imbalance, thyroid or vitamin disorder. Discussed importance of good diabetes control and adequate hydration. I feel like her problems largely due to stress. I have recommended she abstain from caring for her sick family member this week for some rest and recuperation.  -  TSH - VITAMIN D 25 Hydroxy (Vit-D Deficiency, Fractures) - Magnesium - Basic Metabolic Panel (BMET) - Calcium  2. Type 2 diabetes mellitus with vascular disease ([place]) *  3. Postsurgical hypothyroidism *  4. Vitamin D deficiency * - VITAMIN D 25 Hydroxy (Vit-D Deficiency, Fractures)   Patient Instructions  Go for lab tests today I will send you a letter with your test results.  If there is anything of concern, we will call right away. Call the neurologist tomorrow if the face numbness persists Drink more water  Call if not improved in a few days   Raylene Everts, MD

## 2016-03-24 LAB — BASIC METABOLIC PANEL
BUN: 18 mg/dL (ref 7–25)
CO2: 24 mmol/L (ref 20–31)
Calcium: 9.6 mg/dL (ref 8.6–10.4)
Chloride: 94 mmol/L — ABNORMAL LOW (ref 98–110)
Creat: 0.78 mg/dL (ref 0.60–0.93)
Glucose, Bld: 314 mg/dL — ABNORMAL HIGH (ref 65–99)
Potassium: 4.6 mmol/L (ref 3.5–5.3)
Sodium: 134 mmol/L — ABNORMAL LOW (ref 135–146)

## 2016-03-24 LAB — MAGNESIUM: Magnesium: 1.5 mg/dL (ref 1.5–2.5)

## 2016-03-24 LAB — TSH: TSH: 2.49 mIU/L

## 2016-03-24 LAB — VITAMIN D 25 HYDROXY (VIT D DEFICIENCY, FRACTURES): Vit D, 25-Hydroxy: 31 ng/mL (ref 30–100)

## 2016-03-24 LAB — CALCIUM: Calcium: 9.6 mg/dL (ref 8.6–10.4)

## 2016-03-30 ENCOUNTER — Other Ambulatory Visit: Payer: Self-pay | Admitting: Cardiovascular Disease

## 2016-03-30 NOTE — Telephone Encounter (Signed)
Rx has been sent to the pharmacy electronically. ° °

## 2016-04-06 ENCOUNTER — Ambulatory Visit: Payer: Medicare Other | Admitting: Family Medicine

## 2016-04-12 ENCOUNTER — Encounter: Payer: Self-pay | Admitting: Diagnostic Neuroimaging

## 2016-04-12 ENCOUNTER — Ambulatory Visit (INDEPENDENT_AMBULATORY_CARE_PROVIDER_SITE_OTHER): Payer: Medicare Other | Admitting: Diagnostic Neuroimaging

## 2016-04-12 VITALS — BP 111/62 | HR 67 | Wt 197.0 lb

## 2016-04-12 DIAGNOSIS — E1159 Type 2 diabetes mellitus with other circulatory complications: Secondary | ICD-10-CM

## 2016-04-12 DIAGNOSIS — G8929 Other chronic pain: Secondary | ICD-10-CM | POA: Diagnosis not present

## 2016-04-12 DIAGNOSIS — R200 Anesthesia of skin: Secondary | ICD-10-CM

## 2016-04-12 DIAGNOSIS — G479 Sleep disorder, unspecified: Secondary | ICD-10-CM

## 2016-04-12 DIAGNOSIS — K089 Disorder of teeth and supporting structures, unspecified: Secondary | ICD-10-CM | POA: Diagnosis not present

## 2016-04-12 NOTE — Progress Notes (Signed)
[facility]  PATIENT: Jennifer Blackburn DOB: [birth year]  REFERRING CLINICIAN: Moshe Cipro HISTORY FROM: patient  REASON FOR VISIT: follow up   HISTORICAL  CHIEF COMPLAINT:  Chief Complaint  Patient presents with  . Transient cerebral ischemia    rm 6, " one episode of numbness on R side of face, assoc w/stress; when I put my partial in and attach to crown I get a jolt; I want to see my dentist"  . Follow-up    4 month    HISTORY OF PRESENT ILLNESS:   UPDATE 04/12/16: Since last visit, has a few more right facial numbness attacks, (1-2 minutes), then resolved. Triggers include stress. Patient notes that ever since a crown in right upper region (maxillary), since Jan 2017, she has had intermittent right facial numbness and right facial shock sensations. Also TTE was unremarkable. Also sleep study was unremarkable.   UPDATE 12/09/15: Since last visit, had 1 more minor facial event (last week), but not other sxs. TTE and sleep study are pending. Overall doing well.   PRIOR HPI (10/27/15): 71 year old right-handed female with history of diabetes, hypothyroidism, hypertension, hypercholesterolemia, here for evaluation of right face, throat, tongue and hand numbness. 10/07/15 patient noticed sudden onset right face numbness lasting 2-3 minutes at a time intermittently. This went on for 1-2 weeks. July 19 patient also noticed right face and right hand numbness. Patient apparently went to urgent care or emergency room had CT scan and MRI scan as of the brain for the symptoms which ruled out acute stroke. Patient presented here for further evaluation and treatment. Patient previously was taking aspirin on a daily basis. This was stopped 2-3 years ago.   REVIEW OF SYSTEMS: Full 14 system review of systems performed and negative with exception of: back pain.   ALLERGIES: Allergies  Allergen Reactions  . Sulfonamide Derivatives Hives    HOME MEDICATIONS: Outpatient Medications  Prior to Visit  Medication Sig Dispense Refill  . aspirin EC 81 MG tablet Take 1 tablet (81 mg total) by mouth daily.    . fluticasone (FLONASE) 50 MCG/ACT nasal spray Place 2 sprays into both nostrils daily. 16 g 6  . gabapentin (NEURONTIN) 100 MG capsule TAKE 1 CAPSULE(100 MG) BY MOUTH THREE TIMES DAILY 90 capsule 3  . glucose blood (ACCU-CHEK AVIVA PLUS) test strip Use as instructed four times daily dx E11.65 150 each 5  . imipramine (TOFRANIL) 25 MG tablet TAKE 4 TABLETS BY MOUTH EVERY NIGHT AT BEDTIME 120 tablet 4  . Insulin Glargine (TOUJEO SOLOSTAR) 300 UNIT/ML SOPN Inject 60 Units into the skin at bedtime.    . Insulin Pen Needle (B-D ULTRAFINE III SHORT PEN) 31G X 8 MM MISC Use to inject insulin daily 50 each 1  . Lancets (ACCU-CHEK MULTICLIX) lancets Use as instructed three times daily dx 250.01 100 each 5  . levothyroxine (SYNTHROID) 200 MCG tablet Take 1 tablet (200 mcg total) by mouth daily before breakfast. 30 tablet 5  . linagliptin (TRADJENTA) 5 MG TABS tablet Take 5 mg by mouth daily.    Marland Kitchen loratadine (CLARITIN) 10 MG tablet Take 10 mg by mouth daily.    . metFORMIN (GLUCOPHAGE) 1000 MG tablet TAKE 1 TABLET BY MOUTH TWICE DAILY 180 tablet 1  . metoprolol (LOPRESSOR) 50 MG tablet TAKE 1 TABLET BY MOUTH TWICE DAILY 60 tablet 8  . PARoxetine (PAXIL) 20 MG tablet TAKE 1 TABLET BY MOUTH EVERY DAY 90 tablet 1  . pravastatin (PRAVACHOL) 80 MG tablet TAKE 1  TABLET BY MOUTH DAILY 90 tablet 1  . quinapril (ACCUPRIL) 10 MG tablet TAKE 1 TABLET BY MOUTH DAILY 90 tablet 1  . verapamil (VERELAN PM) 180 MG 24 hr capsule Take 1 capsule (180 mg total) by mouth daily. Need appointment before anymore refill 30 capsule 0  . vitamin B-12 (CYANOCOBALAMIN) 1000 MCG tablet Take 1 tablet (1,000 mcg total) by mouth daily. 30 tablet 5  . Vitamin D, Ergocalciferol, (DRISDOL) 50000 units CAPS capsule TAKE 1 CAPSULE BY MOUTH EVERY 7 DAYS 4 capsule 6  . CINNAMON PO Take by mouth.    Marland Kitchen amoxicillin (AMOXIL) 875  MG tablet Take 1 tablet (875 mg total) by mouth 2 (two) times daily. 20 tablet 0  . gemfibrozil (LOPID) 600 MG tablet     . tiZANidine (ZANAFLEX) 4 MG capsule Take 1 capsule (4 mg total) by mouth 3 (three) times daily as needed for muscle spasms. 30 capsule 0   No facility-administered medications prior to visit.     PAST MEDICAL HISTORY: Past Medical History:  Diagnosis Date  . Anxiety   . CAD (coronary artery disease)   . Depression   . Diabetes mellitus type II    without complication  . DJD (degenerative joint disease) of lumbar spine   . Hypercholesteremia   . Hyperlipidemia   . Hypertension    benign   . Thyroid cancer ([place]) 2001    PAST SURGICAL HISTORY: Past Surgical History:  Procedure Laterality Date  . ABDOMINAL HYSTERECTOMY    . CARDIAC CATHETERIZATION    . CATARACT EXTRACTION W/PHACO Right 07/17/2012   Procedure: CATARACT EXTRACTION PHACO AND INTRAOCULAR LENS PLACEMENT (IOC);  Surgeon: Tonny Branch, MD;  Location: [facility];  Service: Ophthalmology;  Laterality: Right;  CDE:25.51  . COLONOSCOPY    . COLONOSCOPY N/A 01/15/2014   Procedure: COLONOSCOPY;  Surgeon: Daneil Dolin, MD;  Location: [facility] SUITE;  Service: Endoscopy;  Laterality: N/A;  9:00 AM  . DOPPLER ECHOCARDIOGRAPHY    . JOINT REPLACEMENT  07/01/2010   left hip  . KNEE SURGERY Right    arthroscopy  . left hip replaced  07/01/2010  . SPINE SURGERY  2006   cervical  . stress dipyridamole myocardial perfusion    . THYROIDECTOMY    . TONSILLECTOMY    . VESICOVAGINAL FISTULA CLOSURE W/ TAH      FAMILY HISTORY: Family History  Problem Relation Age of Onset  . Heart attack Father   . Heart failure Mother   . Colon cancer Neg Hx     SOCIAL HISTORY:  Social History   Social History  . Marital status: Married    Spouse name: N/A  . Number of children: 2  . Years of education: 14   Occupational History  . retired      Merchandiser, retail   Social History Main Topics  . Smoking status: Passive Smoke  Exposure - Never Smoker  . Smokeless tobacco: Never Used  . Alcohol use No  . Drug use: No  . Sexual activity: Not on file   Other Topics Concern  . Not on file   Social History Narrative   Lives with husband, at home   No caffeine     PHYSICAL EXAM  GENERAL EXAM/CONSTITUTIONAL: Vitals:  Vitals:   04/12/16 1204  BP: 111/62  Pulse: 67  Weight: 197 lb (89.4 kg)   Wt Readings from Last 3 Encounters:  04/12/16 197 lb (89.4 kg)  03/23/16 194 lb 1.9 oz (88.1 kg)  02/09/16 195  lb (88.5 kg)    Body mass index is 32.78 kg/m. No exam data present  Patient is in no distress; well developed, nourished and groomed; neck is supple  CARDIOVASCULAR:  Examination of carotid arteries is normal; no carotid bruits  Regular rate and rhythm, no murmurs  Examination of peripheral vascular system by observation and palpation is normal  EYES:  Ophthalmoscopic exam of optic discs and posterior segments is normal; no papilledema or hemorrhages  MUSCULOSKELETAL:  Gait, strength, tone, movements noted in Neurologic exam below  NEUROLOGIC: MENTAL STATUS:  No flowsheet data found.  awake, alert, oriented to person, place and time  recent and remote memory intact  normal attention and concentration  language fluent, comprehension intact, naming intact,   fund of knowledge appropriate  CRANIAL NERVE:   2nd - no papilledema on fundoscopic exam  2nd, 3rd, 4th, 6th - pupils equal and reactive to light, visual fields full to confrontation, extraocular muscles intact, no nystagmus  5th - facial sensation symmetric  7th - facial strength symmetric  8th - hearing intact  9th - palate elevates symmetrically, uvula midline  11th - shoulder shrug symmetric  12th - tongue protrusion midline  MOTOR:   normal bulk and tone, full strength in the BUE, BLE  SENSORY:   normal and symmetric to light touch, temperature, vibration  COORDINATION:   finger-nose-finger, fine  finger movements normal  REFLEXES:   deep tendon reflexes present and symmetric  GAIT/STATION:   narrow based gait, TANDEM STABLE    DIAGNOSTIC DATA (LABS, IMAGING, TESTING) - I reviewed patient records, labs, notes, testing and imaging myself where available.  Lab Results  Component Value Date   WBC 6.1 08/20/2014   HGB 11.6 (L) 08/20/2014   HCT 35.9 (L) 08/20/2014   MCV 83.9 08/20/2014   PLT 254 08/20/2014      Component Value Date/Time   NA 134 (L) 03/23/2016 1631   K 4.6 03/23/2016 1631   CL 94 (L) 03/23/2016 1631   CO2 24 03/23/2016 1631   GLUCOSE 314 (H) 03/23/2016 1631   BUN 18 03/23/2016 1631   CREATININE 0.78 03/23/2016 1631   CALCIUM 9.6 03/23/2016 1631   CALCIUM 9.6 03/23/2016 1631   PROT 7.2 11/14/2014 0836   ALBUMIN 4.1 11/14/2014 0836   AST 22 11/14/2014 0836   ALT 21 11/14/2014 0836   ALKPHOS 36 11/14/2014 0836   BILITOT 0.3 11/14/2014 0836   GFRNONAA 84 11/14/2014 0836   GFRAA >89 11/14/2014 0836   Lab Results  Component Value Date   CHOL 175 11/14/2014   HDL 42 (L) 11/14/2014   LDLCALC 132 10/20/2015   TRIG 106 11/14/2014   CHOLHDL 4.2 11/14/2014   Lab Results  Component Value Date   HGBA1C 8.0 10/20/2015   Lab Results  Component Value Date   VITAMINB12 183 (L) 08/20/2014   Lab Results  Component Value Date   TSH 2.49 03/23/2016    10/08/15 EKG - normal sinus rhythm  10/08/15 MRI brain [report only] - No acute intracranial pathology identified, specifically no acute infarct. - Mild to moderate chronic small vessel ischemic change. - Chronic sphenoid sinus disease.  10/29/15 Carotid u/s - Mild atherosclerotic disease in the carotid arteries. No significant carotid artery stenosis. Estimated degree of stenosis in the internal carotid arteries is less than 50% bilaterally.  - Patent vertebral arteries.  12/16/15 TTE  - Left ventricle: The cavity size was normal. There was mild   concentric hypertrophy. Systolic function was  vigorous. The  estimated ejection fraction was in the range of 65% to 70%. Wall   motion was normal; there were no regional wall motion   abnormalities. Doppler parameters are consistent with abnormal   left ventricular relaxation (grade 1 diastolic dysfunction).   Doppler parameters are consistent with elevated ventricular   end-diastolic filling pressure. - Aortic valve: Trileaflet; normal thickness leaflets.   Transvalvular velocity was within the normal range. There was no   stenosis. There was no regurgitation. - Aortic root: The aortic root was normal in size. - Mitral valve: Structurally normal valve. Transvalvular velocity   was within the normal range. There was no evidence for stenosis.   There was trivial regurgitation. - Right atrium: The atrium was normal in size. - Pulmonic valve: There was trivial regurgitation. - Pulmonary arteries: The main pulmonary artery was normal-sized.   Systolic pressure was within the normal range. - Inferior vena cava: The vessel was normal in size. The   respirophasic diameter changes were in the normal range (= 50%),   consistent with normal central venous pressure. - Pericardium, extracardiac: There was no pericardial effusion.    ASSESSMENT AND PLAN  71 y.o. year old female here with intermittent right face and arm numbness and tingling in July (and minor event Sept 2017), likely representing transient ischemic attacks. Symptoms localize to left parietal lobe versus left brain subcortical (posterior limb of internal capsule). Workup completed. Continue medical management for secondary stroke prevention.   Dx: TIA (left brain) vs post-dental procedure symptoms (right V2 trigeminal hypersensitivity/neuralgia)  1. Right facial numbness   2. Type 2 diabetes mellitus with vascular disease ([place])   3. Sleep disturbance   4. Chronic dental pain     PLAN: - continue aspirin 81mg  daily - continue statin and diabetes medications -  TIA/stroke warning signs and education reviewed  Return if symptoms worsen or fail to improve, for return to PCP.    Penni Bombard, MD [id number], [id number] PM Certified in Neurology, Neurophysiology and Neuroimaging  [facility] [address] [place], [place] [address] [phone]

## 2016-04-19 ENCOUNTER — Other Ambulatory Visit: Payer: Self-pay | Admitting: Cardiovascular Disease

## 2016-05-03 ENCOUNTER — Other Ambulatory Visit: Payer: Self-pay | Admitting: Cardiovascular Disease

## 2016-05-04 NOTE — Telephone Encounter (Signed)
Rx(s) sent to pharmacy electronically.  

## 2016-05-14 ENCOUNTER — Other Ambulatory Visit: Payer: Self-pay | Admitting: Family Medicine

## 2016-05-14 ENCOUNTER — Other Ambulatory Visit: Payer: Self-pay | Admitting: Cardiovascular Disease

## 2016-05-14 ENCOUNTER — Encounter: Payer: Self-pay | Admitting: Cardiovascular Disease

## 2016-05-14 ENCOUNTER — Telehealth: Payer: Self-pay | Admitting: Cardiovascular Disease

## 2016-05-14 ENCOUNTER — Ambulatory Visit (INDEPENDENT_AMBULATORY_CARE_PROVIDER_SITE_OTHER): Payer: Medicare Other | Admitting: Cardiovascular Disease

## 2016-05-14 VITALS — BP 136/76 | HR 80 | Ht 64.0 in | Wt 197.4 lb

## 2016-05-14 DIAGNOSIS — I10 Essential (primary) hypertension: Secondary | ICD-10-CM

## 2016-05-14 MED ORDER — METOPROLOL TARTRATE 50 MG PO TABS
50.0000 mg | ORAL_TABLET | Freq: Two times a day (BID) | ORAL | 12 refills | Status: DC
Start: 2016-05-14 — End: 2017-05-14

## 2016-05-14 MED ORDER — VERAPAMIL HCL ER 180 MG PO CP24: 180.0000 mg | ORAL_CAPSULE | Freq: Every day | ORAL | 2 refills | Status: DC

## 2016-05-14 NOTE — Assessment & Plan Note (Signed)
History of hyperlipidemia on statin therapy with LDL of 132 performed 10/20/15

## 2016-05-14 NOTE — Telephone Encounter (Signed)
Refill sent to the pharmacy electronically.  

## 2016-05-14 NOTE — Patient Instructions (Signed)

## 2016-05-14 NOTE — Telephone Encounter (Signed)
New message     *STAT* If patient is at the pharmacy, call can be transferred to refill team.   1. Which medications need to be refilled? (please list name of each medication and dose if known) metoprolol 50 mg   2. Which pharmacy/location (including street and city if local pharmacy) is medication to be sent to? [facility] in [place]  3. Do they need a 30 day or 90 day supply? 30 day

## 2016-05-14 NOTE — Assessment & Plan Note (Signed)
History of hypertension blood pressure measures 136/76. She is on metoprolol, verapamil and quinapril. Continue current meds at current dosing

## 2016-05-14 NOTE — Progress Notes (Signed)
05/14/2016 Jennifer Blackburn   1945-04-11  [id number]  Primary Physician Tula Nakayama, MD Primary Cardiologist: Lorretta Harp MD Jennifer Blackburn, [place]  HPI:  The patient is a 71 year old, moderately overweight, married [place] American female, mother of 2, grandmother to 4 grandchildren who I last saw in the office 02/19/15 . She has a history of minimal CAD by cath back in June 2005 with normal LV function. Her other problems include treated hypertension, diabetes and dyslipidemia. She does have a strong family history of heart disease with a father that died of an MI at age 12. Most recent lipid profile performed by Dr. Moshe Cipro on 11/14/14 revealing a total cholesterol 175, LDL of 112 and HDL of 42.. Dr. Moshe Cipro continues to follow her with profile . She has had several episodes of nocturnal chest pressure which has awakened her from sleep with a "heavy sensation on her chest and a feeling of diaphoresis since I last saw her but these are infrequent and have not changed frequency or severity.   Current Outpatient Prescriptions  Medication Sig Dispense Refill  . aspirin EC 81 MG tablet Take 1 tablet (81 mg total) by mouth daily.    Marland Kitchen CINNAMON PO Take by mouth.    . fluticasone (FLONASE) 50 MCG/ACT nasal spray Place 2 sprays into both nostrils daily. 16 g 6  . gabapentin (NEURONTIN) 100 MG capsule TAKE 1 CAPSULE(100 MG) BY MOUTH THREE TIMES DAILY 90 capsule 3  . glucose blood (ACCU-CHEK AVIVA PLUS) test strip Use as instructed four times daily dx E11.65 150 each 5  . imipramine (TOFRANIL) 25 MG tablet TAKE 4 TABLETS BY MOUTH EVERY NIGHT AT BEDTIME 120 tablet 4  . Insulin Glargine (TOUJEO SOLOSTAR) 300 UNIT/ML SOPN Inject 60 Units into the skin at bedtime.    . Insulin Pen Needle (B-D ULTRAFINE III SHORT PEN) 31G X 8 MM MISC Use to inject insulin daily 50 each 1  . Lancets (ACCU-CHEK MULTICLIX) lancets Use as instructed three times daily dx 250.01 100 each 5  . levothyroxine  (SYNTHROID) 200 MCG tablet Take 1 tablet (200 mcg total) by mouth daily before breakfast. 30 tablet 5  . linagliptin (TRADJENTA) 5 MG TABS tablet Take 5 mg by mouth daily.    Marland Kitchen loratadine (CLARITIN) 10 MG tablet Take 10 mg by mouth daily.    . metFORMIN (GLUCOPHAGE) 1000 MG tablet TAKE 1 TABLET BY MOUTH TWICE DAILY 180 tablet 1  . metoprolol (LOPRESSOR) 50 MG tablet TAKE 1 TABLET BY MOUTH TWICE DAILY 60 tablet 0  . PARoxetine (PAXIL) 20 MG tablet TAKE 1 TABLET BY MOUTH EVERY DAY 90 tablet 1  . pravastatin (PRAVACHOL) 80 MG tablet TAKE 1 TABLET BY MOUTH DAILY 90 tablet 1  . quinapril (ACCUPRIL) 10 MG tablet TAKE 1 TABLET BY MOUTH DAILY 90 tablet 1  . verapamil (VERELAN PM) 180 MG 24 hr capsule Take 1 capsule (180 mg total) by mouth daily. 30 capsule 2  . vitamin B-12 (CYANOCOBALAMIN) 1000 MCG tablet Take 1 tablet (1,000 mcg total) by mouth daily. 30 tablet 5  . Vitamin D, Ergocalciferol, (DRISDOL) 50000 units CAPS capsule TAKE 1 CAPSULE BY MOUTH EVERY 7 DAYS 4 capsule 6   No current facility-administered medications for this visit.     Allergies  Allergen Reactions  . Sulfonamide Derivatives Hives    Social History   Social History  . Marital status: Married    Spouse name: N/A  . Number of children: 2  .  Years of education: 4   Occupational History  . retired      Merchandiser, retail   Social History Main Topics  . Smoking status: Passive Smoke Exposure - Never Smoker  . Smokeless tobacco: Never Used  . Alcohol use No  . Drug use: No  . Sexual activity: Not on file   Other Topics Concern  . Not on file   Social History Narrative   Lives with husband, at home   No caffeine     Review of Systems: General: negative for chills, fever, night sweats or weight changes.  Cardiovascular: negative for chest pain, dyspnea on exertion, edema, orthopnea, palpitations, paroxysmal nocturnal dyspnea or shortness of breath Dermatological: negative for rash Respiratory: negative for cough or  wheezing Urologic: negative for hematuria Abdominal: negative for nausea, vomiting, diarrhea, bright red blood per rectum, melena, or hematemesis Neurologic: negative for visual changes, syncope, or dizziness All other systems reviewed and are otherwise negative except as noted above.    Blood pressure 136/76, pulse 80, height 5\' 4"  (1.626 m), weight 197 lb 6.4 oz (89.5 kg), SpO2 98 %.  General appearance: alert and no distress Neck: no adenopathy, no carotid bruit, no JVD, supple, symmetrical, trachea midline and thyroid not enlarged, symmetric, no tenderness/mass/nodules Lungs: clear to auscultation bilaterally Heart: regular rate and rhythm, S1, S2 normal, no murmur, click, rub or gallop Extremities: extremities normal, atraumatic, no cyanosis or edema  EKG sinus rhythm at 80 without ST or T-wave changes. I personally reviewed this EKG.  ASSESSMENT AND PLAN:   Hyperlipidemia History of hyperlipidemia on statin therapy with LDL of 132 performed 10/20/15   ESSENTIAL HYPERTENSION, BENIGN History of hypertension blood pressure measures 136/76. She is on metoprolol, verapamil and quinapril. Continue current meds at current dosing      Lorretta Harp MD [facility], [facility] 05/14/2016 11:32 AM

## 2016-06-08 DIAGNOSIS — E1142 Type 2 diabetes mellitus with diabetic polyneuropathy: Secondary | ICD-10-CM | POA: Diagnosis not present

## 2016-06-08 DIAGNOSIS — M2040 Other hammer toe(s) (acquired), unspecified foot: Secondary | ICD-10-CM | POA: Diagnosis not present

## 2016-06-14 ENCOUNTER — Other Ambulatory Visit: Payer: Self-pay | Admitting: Family Medicine

## 2016-06-24 DIAGNOSIS — K219 Gastro-esophageal reflux disease without esophagitis: Secondary | ICD-10-CM | POA: Diagnosis not present

## 2016-07-13 ENCOUNTER — Other Ambulatory Visit: Payer: Self-pay | Admitting: Family Medicine

## 2016-07-13 DIAGNOSIS — I10 Essential (primary) hypertension: Secondary | ICD-10-CM | POA: Diagnosis not present

## 2016-07-13 DIAGNOSIS — E1159 Type 2 diabetes mellitus with other circulatory complications: Secondary | ICD-10-CM | POA: Diagnosis not present

## 2016-07-13 DIAGNOSIS — Z1159 Encounter for screening for other viral diseases: Secondary | ICD-10-CM | POA: Diagnosis not present

## 2016-07-13 DIAGNOSIS — E559 Vitamin D deficiency, unspecified: Secondary | ICD-10-CM | POA: Diagnosis not present

## 2016-07-13 DIAGNOSIS — E785 Hyperlipidemia, unspecified: Secondary | ICD-10-CM | POA: Diagnosis not present

## 2016-07-13 LAB — COMPLETE METABOLIC PANEL WITH GFR
ALT: 15 U/L (ref 6–29)
AST: 17 U/L (ref 10–35)
Albumin: 4 g/dL (ref 3.6–5.1)
Alkaline Phosphatase: 33 U/L (ref 33–130)
BUN: 11 mg/dL (ref 7–25)
CO2: 28 mmol/L (ref 20–31)
Calcium: 8.7 mg/dL (ref 8.6–10.4)
Chloride: 102 mmol/L (ref 98–110)
Creat: 0.69 mg/dL (ref 0.60–0.93)
GFR, Est African American: >89 mL/min (ref >=60)
GFR, Est Non African American: 88 mL/min (ref >=60)
Glucose, Bld: 156 mg/dL — ABNORMAL HIGH (ref 65–99)
Potassium: 4.4 mmol/L (ref 3.5–5.3)
Sodium: 139 mmol/L (ref 135–146)
Total Bilirubin: 0.3 mg/dL (ref 0.2–1.2)
Total Protein: 7.1 g/dL (ref 6.1–8.1)

## 2016-07-13 LAB — LIPID PANEL
Cholesterol: 172 mg/dL (ref <200)
HDL: 42 mg/dL — ABNORMAL LOW (ref >50)
LDL Cholesterol: 100 mg/dL — ABNORMAL HIGH (ref <100)
Total CHOL/HDL Ratio: 4.1 Ratio (ref <5.0)
Triglycerides: 148 mg/dL (ref <150)
VLDL: 30 mg/dL (ref <30)

## 2016-07-13 LAB — CBC
HCT: 34.9 % — ABNORMAL LOW (ref 35.0–45.0)
Hemoglobin: 11.3 g/dL — ABNORMAL LOW (ref 11.7–15.5)
MCH: 26.7 pg — ABNORMAL LOW (ref 27.0–33.0)
MCHC: 32.4 g/dL (ref 32.0–36.0)
MCV: 82.5 fL (ref 80.0–100.0)
MPV: 9.7 fL (ref 7.5–12.5)
Platelets: 253 10*3/uL (ref 140–400)
RBC: 4.23 MIL/uL (ref 3.80–5.10)
RDW: 13.9 % (ref 11.0–15.0)
WBC: 6.4 10*3/uL (ref 3.8–10.8)

## 2016-07-14 ENCOUNTER — Other Ambulatory Visit: Payer: Self-pay | Admitting: Family Medicine

## 2016-07-14 ENCOUNTER — Ambulatory Visit (INDEPENDENT_AMBULATORY_CARE_PROVIDER_SITE_OTHER): Payer: Medicare Other | Admitting: Family Medicine

## 2016-07-14 ENCOUNTER — Encounter: Payer: Self-pay | Admitting: Family Medicine

## 2016-07-14 VITALS — BP 146/72 | HR 68 | Temp 97.4°F | Resp 18 | Ht 64.0 in | Wt 195.0 lb

## 2016-07-14 DIAGNOSIS — E890 Postprocedural hypothyroidism: Secondary | ICD-10-CM | POA: Diagnosis not present

## 2016-07-14 DIAGNOSIS — M549 Dorsalgia, unspecified: Secondary | ICD-10-CM

## 2016-07-14 DIAGNOSIS — I10 Essential (primary) hypertension: Secondary | ICD-10-CM | POA: Diagnosis not present

## 2016-07-14 DIAGNOSIS — M4716 Other spondylosis with myelopathy, lumbar region: Secondary | ICD-10-CM | POA: Diagnosis not present

## 2016-07-14 DIAGNOSIS — E1159 Type 2 diabetes mellitus with other circulatory complications: Secondary | ICD-10-CM | POA: Diagnosis not present

## 2016-07-14 DIAGNOSIS — E784 Other hyperlipidemia: Secondary | ICD-10-CM

## 2016-07-14 DIAGNOSIS — E7849 Other hyperlipidemia: Secondary | ICD-10-CM

## 2016-07-14 DIAGNOSIS — J3089 Other allergic rhinitis: Secondary | ICD-10-CM | POA: Diagnosis not present

## 2016-07-14 DIAGNOSIS — G4489 Other headache syndrome: Secondary | ICD-10-CM | POA: Diagnosis not present

## 2016-07-14 DIAGNOSIS — E669 Obesity, unspecified: Secondary | ICD-10-CM | POA: Diagnosis not present

## 2016-07-14 DIAGNOSIS — F419 Anxiety disorder, unspecified: Secondary | ICD-10-CM

## 2016-07-14 DIAGNOSIS — F32A Depression, unspecified: Secondary | ICD-10-CM

## 2016-07-14 DIAGNOSIS — F329 Major depressive disorder, single episode, unspecified: Secondary | ICD-10-CM | POA: Diagnosis not present

## 2016-07-14 LAB — VITAMIN D 25 HYDROXY (VIT D DEFICIENCY, FRACTURES): Vit D, 25-Hydroxy: 28 ng/mL — ABNORMAL LOW (ref 30–100)

## 2016-07-14 NOTE — Patient Instructions (Addendum)
f/u in 5 months, call if you need me before  You are referred to therapy  Pls find volunteer activity that you will enjoy  Consider trips, girl trips  You are referred to Dr Vertell Limber for re evaluation, woll order MRI if needed  Will call with labs  Thank you  for choosing [facility]. We consider it a privelige to serve you.  Delivering excellent health care in a caring and  compassionate way is our goal.  Partnering with you,  so that together we can achieve this goal is our strategy.  \

## 2016-07-15 LAB — HEPATITIS C ANTIBODY: HCV Ab: NEGATIVE

## 2016-07-16 ENCOUNTER — Other Ambulatory Visit: Payer: Self-pay | Admitting: Family Medicine

## 2016-07-16 DIAGNOSIS — E1165 Type 2 diabetes mellitus with hyperglycemia: Secondary | ICD-10-CM | POA: Diagnosis not present

## 2016-07-16 DIAGNOSIS — E784 Other hyperlipidemia: Secondary | ICD-10-CM | POA: Diagnosis not present

## 2016-07-16 DIAGNOSIS — E890 Postprocedural hypothyroidism: Secondary | ICD-10-CM | POA: Diagnosis not present

## 2016-07-16 DIAGNOSIS — C73 Malignant neoplasm of thyroid gland: Secondary | ICD-10-CM | POA: Diagnosis not present

## 2016-07-18 ENCOUNTER — Encounter: Payer: Self-pay | Admitting: Family Medicine

## 2016-07-18 ENCOUNTER — Telehealth: Payer: Self-pay | Admitting: Family Medicine

## 2016-07-18 DIAGNOSIS — M4716 Other spondylosis with myelopathy, lumbar region: Secondary | ICD-10-CM | POA: Insufficient documentation

## 2016-07-18 NOTE — Assessment & Plan Note (Signed)
Reports worsened and uncontrolled pain in past 8 to 12 months, radiates down leg, has established pathology on imaging 4 years ago, updated study and neurosurgery evaluatiuon

## 2016-07-18 NOTE — Telephone Encounter (Signed)
Pt stated she had labs done for me , the only lab I see is the hepatitis screen added, pls see if lipid, cmp and EGFr, CBC  is available, she sees Dr Carmela Rima so I believe he will have her hBA1C , TSH and Vit D, she is expecting a call re her labs . Pls give me feedback info also Her Hep C is negative, let her know Also pls let her know I have referred her for MRI of her low ack also to see P Bynum for therapy and to Dr Vertell Limber re uncontrolled back pain

## 2016-07-18 NOTE — Assessment & Plan Note (Signed)
Sub optimal control, no med change DASH diet and commitment to daily physical activity for a minimum of 30 minutes discussed and encouraged, as a part of hypertension management. The importance of attaining a healthy weight is also discussed.  BP/Weight 07/14/2016 05/14/2016 04/12/2016 03/23/2016 02/09/2016 01/12/2016 80/99/8338  Systolic BP 250 539 767 341 937 902 409  Diastolic BP 72 76 62 70 72 60 70  Wt. (Lbs) 195 197.4 197 194.12 195 196 193.04  BMI 33.47 33.88 32.78 32.3 32.45 32.62 32.12

## 2016-07-18 NOTE — Assessment & Plan Note (Signed)
Controlled, no change in medication  

## 2016-07-18 NOTE — Assessment & Plan Note (Signed)
Hyperlipidemia:Low fat diet discussed and encouraged.   Lipid Panel  Lab Results  Component Value Date   CHOL 175 11/14/2014   HDL 42 (L) 11/14/2014   LDLCALC 132 10/20/2015   TRIG 106 11/14/2014   CHOLHDL 4.2 11/14/2014     Updated lab needed, states had blood drawn 1 day prior will attempt to track this down

## 2016-07-18 NOTE — Assessment & Plan Note (Signed)
Uncontrolled and managed by endo. Updated lab needed

## 2016-07-18 NOTE — Assessment & Plan Note (Signed)
6 month h/o increased and uncontrolled pain and lower extremity numbness , requests referral to neurosurgeon for evaluation, has had C spine surgery, no lumbar surgery, may need lumbar imaging will see, last image was is 2014, update image needed

## 2016-07-18 NOTE — Assessment & Plan Note (Signed)
Medication management is adequate , however , she needs to return to therapist, will refer back and she agrees that this is beneficial

## 2016-07-18 NOTE — Assessment & Plan Note (Signed)
Unchnaged Patient re-educated about  the importance of commitment to a  minimum of 150 minutes of exercise per week.  The importance of healthy food choices with portion control discussed. Encouraged to start a food diary, count calories and to consider  joining a support group. Sample diet sheets offered. Goals set by the patient for the next several months.   Weight /BMI 07/14/2016 05/14/2016 04/12/2016  WEIGHT 195 lb 197 lb 6.4 oz 197 lb  HEIGHT 5\' 4"  5\' 4"  -  BMI 33.47 kg/m2 33.88 kg/m2 32.78 kg/m2

## 2016-07-18 NOTE — Assessment & Plan Note (Signed)
Managed by endo

## 2016-07-18 NOTE — Progress Notes (Signed)
Jennifer Blackburn     MRN: [id number]      DOB: January 22, 1946   HPI Jennifer Blackburn is here for follow up and re-evaluation of chronic medical conditions, medication management and review of any available recent lab and radiology data.  Preventive health is updated, specifically  Cancer screening and Immunization.   Questions or concerns regarding consultations or procedures which the PT has had in the interim are  addressed. The PT denies any adverse reactions to current medications since the last visit.  c/o increased anxiety and stress at hpome , will return to therapy c/o increased back and lower extremity pain Blood sugar remains high, but she is working with endo  ROS Denies recent fever or chills. Denies sinus pressure, nasal congestion, ear pain or sore throat. Denies chest congestion, productive cough or wheezing. Denies chest pains, palpitations and leg swelling Denies abdominal pain, nausea, vomiting,diarrhea or constipation.   Denies dysuria, frequency, hesitancy or incontinence.  Denies headaches, seizures,   Denies skin break down or rash.   PE  BP (!) 146/72 (BP Location: Left Arm, Patient Position: Sitting, Cuff Size: Normal)   Pulse 68   Temp 97.4 F (36.3 C) (Temporal)   Resp 18   Ht 5\' 4"  (1.626 m)   Wt 195 lb (88.5 kg)   SpO2 98%   BMI 33.47 kg/m   Patient alert and oriented and in no cardiopulmonary distress.  HEENT: No facial asymmetry, EOMI,   oropharynx pink and moist.  Neck supple no JVD, no mass.  Chest: Clear to auscultation bilaterally.  CVS: S1, S2 no murmurs, no S3.Regular rate.  ABD: Soft non tender.   Ext: No edema  MS: Decreased  ROM lumbar spine, normal in shoulders, hips and knees.  Skin: Intact, no ulcerations or rash noted.  Psych: Good eye contact, normal affect. Memory intact not anxious or depressed appearing.  CNS: CN 2-12 intact,grade 4  Power in right leg otherwise  normal throughout., decreased sensation in right  leg Assessment & Plan  Back pain with radiation 6 month h/o increased and uncontrolled pain and lower extremity numbness , requests referral to neurosurgeon for evaluation, has had C spine surgery, no lumbar surgery, may need lumbar imaging will see, last image was is 2014, update image needed  Lumbar spondylosis with myelopathy Reports worsened and uncontrolled pain in past 8 to 12 months, radiates down leg, has established pathology on imaging 4 years ago, updated study and neurosurgery evaluatiuon  Hyperlipidemia Hyperlipidemia:Low fat diet discussed and encouraged.   Lipid Panel  Lab Results  Component Value Date   CHOL 175 11/14/2014   HDL 42 (L) 11/14/2014   LDLCALC 132 10/20/2015   TRIG 106 11/14/2014   CHOLHDL 4.2 11/14/2014     Updated lab needed, states had blood drawn 1 day prior will attempt to track this down   Anxiety and depression Medication management is adequate , however , she needs to return to therapist, will refer back and she agrees that this is beneficial  Postsurgical hypothyroidism Managed by endo  Headache Controlled, no change in medication   ESSENTIAL HYPERTENSION, BENIGN Sub optimal control, no med change DASH diet and commitment to daily physical activity for a minimum of 30 minutes discussed and encouraged, as a part of hypertension management. The importance of attaining a healthy weight is also discussed.  BP/Weight 07/14/2016 05/14/2016 04/12/2016 03/23/2016 02/09/2016 01/12/2016 16/96/7893  Systolic BP 810 175 102 585 277 824 235  Diastolic BP 72 76 62 70  72 60 70  Wt. (Lbs) 195 197.4 197 194.12 195 196 193.04  BMI 33.47 33.88 32.78 32.3 32.45 32.62 32.12       Allergic rhinitis Controlled, no change in medication   Obesity (BMI 30.0-34.9) Unchnaged Patient re-educated about  the importance of commitment to a  minimum of 150 minutes of exercise per week.  The importance of healthy food choices with portion control  discussed. Encouraged to start a food diary, count calories and to consider  joining a support group. Sample diet sheets offered. Goals set by the patient for the next several months.   Weight /BMI 07/14/2016 05/14/2016 04/12/2016  WEIGHT 195 lb 197 lb 6.4 oz 197 lb  HEIGHT 5\' 4"  5\' 4"  -  BMI 33.47 kg/m2 33.88 kg/m2 32.78 kg/m2      Type 2 diabetes mellitus with vascular disease ([place]) Uncontrolled and managed by endo. Updated lab needed

## 2016-07-19 DIAGNOSIS — E119 Type 2 diabetes mellitus without complications: Secondary | ICD-10-CM | POA: Diagnosis not present

## 2016-07-19 DIAGNOSIS — Z961 Presence of intraocular lens: Secondary | ICD-10-CM | POA: Diagnosis not present

## 2016-07-19 DIAGNOSIS — H25812 Combined forms of age-related cataract, left eye: Secondary | ICD-10-CM | POA: Diagnosis not present

## 2016-07-20 ENCOUNTER — Other Ambulatory Visit: Payer: Self-pay

## 2016-07-20 ENCOUNTER — Telehealth: Payer: Self-pay | Admitting: Cardiovascular Disease

## 2016-07-20 MED ORDER — VITAMIN D (ERGOCALCIFEROL) 1.25 MG (50000 UNIT) PO CAPS: ORAL_CAPSULE | ORAL | 1 refills | Status: DC

## 2016-07-20 NOTE — Telephone Encounter (Signed)
Pt brought paperwork to be filled out and signed by Dr. Gwenlyn Found because she is signing up for new Medicare Supplement plan.  Papers were signed by Dr. Gwenlyn Found today and mailed out.  Pt made aware.

## 2016-07-21 NOTE — Telephone Encounter (Signed)
Her labs that you were looking for are available. There is no result note entered. After sent back I will call patient

## 2016-07-21 NOTE — Telephone Encounter (Signed)
Kidney and liver function are normal Vit D level is slight low, once weekly vit D is prescribed for 6 months, after this take once daily OTC vit D3 2000 IU  Hepatitis C is negative, no exposure/ infection  Please continue pravachol for cholesterol and reduce fried and fatty foods, bad cholesterol is around 100, should be UNDER 100

## 2016-07-22 DIAGNOSIS — C73 Malignant neoplasm of thyroid gland: Secondary | ICD-10-CM | POA: Diagnosis not present

## 2016-07-22 DIAGNOSIS — E1159 Type 2 diabetes mellitus with other circulatory complications: Secondary | ICD-10-CM | POA: Diagnosis not present

## 2016-07-22 DIAGNOSIS — E785 Hyperlipidemia, unspecified: Secondary | ICD-10-CM | POA: Diagnosis not present

## 2016-07-22 DIAGNOSIS — I10 Essential (primary) hypertension: Secondary | ICD-10-CM | POA: Diagnosis not present

## 2016-07-22 DIAGNOSIS — E1165 Type 2 diabetes mellitus with hyperglycemia: Secondary | ICD-10-CM | POA: Diagnosis not present

## 2016-07-22 DIAGNOSIS — E039 Hypothyroidism, unspecified: Secondary | ICD-10-CM | POA: Diagnosis not present

## 2016-07-22 DIAGNOSIS — E669 Obesity, unspecified: Secondary | ICD-10-CM | POA: Diagnosis not present

## 2016-07-23 NOTE — Telephone Encounter (Signed)
Called pt left message.  Results mailed and made aware on the voicemail that was left

## 2016-07-26 DIAGNOSIS — I10 Essential (primary) hypertension: Secondary | ICD-10-CM | POA: Diagnosis not present

## 2016-07-26 DIAGNOSIS — E1159 Type 2 diabetes mellitus with other circulatory complications: Secondary | ICD-10-CM | POA: Diagnosis not present

## 2016-07-26 DIAGNOSIS — E669 Obesity, unspecified: Secondary | ICD-10-CM | POA: Diagnosis not present

## 2016-07-26 DIAGNOSIS — E039 Hypothyroidism, unspecified: Secondary | ICD-10-CM | POA: Diagnosis not present

## 2016-07-26 DIAGNOSIS — E785 Hyperlipidemia, unspecified: Secondary | ICD-10-CM | POA: Diagnosis not present

## 2016-07-26 DIAGNOSIS — E1165 Type 2 diabetes mellitus with hyperglycemia: Secondary | ICD-10-CM | POA: Diagnosis not present

## 2016-07-26 DIAGNOSIS — C73 Malignant neoplasm of thyroid gland: Secondary | ICD-10-CM | POA: Diagnosis not present

## 2016-07-28 ENCOUNTER — Ambulatory Visit (HOSPITAL_COMMUNITY): Payer: Medicare Other

## 2016-08-02 ENCOUNTER — Ambulatory Visit (HOSPITAL_COMMUNITY): Payer: Medicare Other

## 2016-08-02 ENCOUNTER — Telehealth: Payer: Self-pay | Admitting: Cardiovascular Disease

## 2016-08-02 NOTE — Telephone Encounter (Signed)
Spoke with patient and reviewed findings from her 2005 cardiac cath Patient appreciated call back

## 2016-08-02 NOTE — Telephone Encounter (Signed)
New message      Patient said she was told by her ins company that she had CAD.  She has never been told that she has that diagnosis and want to know what is CAD.  Please call

## 2016-08-05 ENCOUNTER — Ambulatory Visit (HOSPITAL_COMMUNITY)
Admission: RE | Admit: 2016-08-05 | Discharge: 2016-08-05 | Disposition: A | Discharge status: Home / Self Care | Payer: Medicare Other | Source: Ambulatory Visit | Attending: Family Medicine | Admitting: Family Medicine

## 2016-08-05 DIAGNOSIS — M4807 Spinal stenosis, lumbosacral region: Secondary | ICD-10-CM | POA: Diagnosis not present

## 2016-08-05 DIAGNOSIS — M545 Low back pain: Secondary | ICD-10-CM | POA: Insufficient documentation

## 2016-08-05 DIAGNOSIS — M4316 Spondylolisthesis, lumbar region: Secondary | ICD-10-CM | POA: Diagnosis not present

## 2016-08-05 DIAGNOSIS — M549 Dorsalgia, unspecified: Secondary | ICD-10-CM

## 2016-08-05 DIAGNOSIS — M48061 Spinal stenosis, lumbar region without neurogenic claudication: Secondary | ICD-10-CM | POA: Diagnosis not present

## 2016-08-05 DIAGNOSIS — M4716 Other spondylosis with myelopathy, lumbar region: Secondary | ICD-10-CM | POA: Diagnosis not present

## 2016-08-18 ENCOUNTER — Ambulatory Visit: Payer: Medicare Other | Admitting: Family Medicine

## 2016-08-19 ENCOUNTER — Telehealth: Payer: Self-pay | Admitting: Family Medicine

## 2016-08-19 NOTE — Telephone Encounter (Signed)
Pt left message on nurse line regarding her Rx being faxed to [phone] (silverscripts).  90 supply please of metformin, parastatin, quinapril, imipramine, and gabapentin.   Pharmacy [phone]

## 2016-08-20 ENCOUNTER — Other Ambulatory Visit: Payer: Self-pay

## 2016-08-20 DIAGNOSIS — M549 Dorsalgia, unspecified: Secondary | ICD-10-CM

## 2016-08-20 MED ORDER — QUINAPRIL HCL 10 MG PO TABS: 10.0000 mg | ORAL_TABLET | Freq: Every day | ORAL | 1 refills | Status: DC

## 2016-08-20 MED ORDER — GABAPENTIN 100 MG PO CAPS: ORAL_CAPSULE | ORAL | 1 refills | Status: DC

## 2016-08-20 MED ORDER — METFORMIN HCL 1000 MG PO TABS: 1000.0000 mg | ORAL_TABLET | Freq: Two times a day (BID) | ORAL | 1 refills | Status: DC

## 2016-08-20 MED ORDER — PRAVASTATIN SODIUM 80 MG PO TABS: 80.0000 mg | ORAL_TABLET | Freq: Every day | ORAL | 1 refills | Status: DC

## 2016-08-20 MED ORDER — IMIPRAMINE HCL 25 MG PO TABS: ORAL_TABLET | ORAL | 1 refills | Status: DC

## 2016-08-20 NOTE — Telephone Encounter (Signed)
meds sent in

## 2016-08-23 ENCOUNTER — Telehealth: Payer: Self-pay | Admitting: Family Medicine

## 2016-08-23 NOTE — Telephone Encounter (Signed)
[place] Reference #: [id number]  Received a prescription for to tofranil 25mg  4 tablets before bed every night quantity 360  The pharmacy wants to know if she can simplify this to 50mg  , 2 tablets at bedtime, 180 quantity.

## 2016-08-24 NOTE — Patient Instructions (Signed)
Jennifer Blackburn  08/24/2016     @PREFPERIOPPHARMACY @   Your procedure is scheduled on 08/30/2016.  Report to [facility] at 6:30 A.M.  Call this number if you have problems the morning of surgery:  [phone]   Remember:  Do not eat food or drink liquids after midnight.  Take these medicines the morning of surgery with A SIP OF WATER Flonase, Neurontin, Tofranil, Synthroid, Claritin, Metoprolol, Paxil, Verapamil  Accupril  DO NOT TAKE DIABETIC MEDICATIONS AM OF SURGERY  TAKE ONLY 1/2 DOSE OF INSULIN EVENING PRIOR TO SURGERY   Do not wear jewelry, make-up or nail polish.  Do not wear lotions, powders, or perfumes, or deoderant.  Do not shave 48 hours prior to surgery.  Men may shave face and neck.  Do not bring valuables to the hospital.  [facility] is not responsible for any belongings or valuables.  Contacts, dentures or bridgework may not be worn into surgery.  Leave your suitcase in the car.  After surgery it may be brought to your room.  For patients admitted to the hospital, discharge time will be determined by your treatment team.  Patients discharged the day of surgery will not be allowed to drive home.    Please read over the following fact sheets that you were given. Anesthesia Post-op Instructions     PATIENT INSTRUCTIONS POST-ANESTHESIA  IMMEDIATELY FOLLOWING SURGERY:  Do not drive or operate machinery for the first twenty four hours after surgery.  Do not make any important decisions for twenty four hours after surgery or while taking narcotic pain medications or sedatives.  If you develop intractable nausea and vomiting or a severe headache please notify your doctor immediately.  FOLLOW-UP:  Please make an appointment with your surgeon as instructed. You do not need to follow up with anesthesia unless specifically instructed to do so.  WOUND CARE INSTRUCTIONS (if applicable):  Keep a dry clean dressing on the anesthesia/puncture wound site if there is  drainage.  Once the wound has quit draining you may leave it open to air.  Generally you should leave the bandage intact for twenty four hours unless there is drainage.  If the epidural site drains for more than 36-48 hours please call the anesthesia department.  QUESTIONS?:  Please feel free to call your physician or the hospital operator if you have any questions, and they will be happy to assist you.      Cataract Surgery Cataract surgery is a procedure to remove a cataract from your eye. A cataract is cloudiness on the lens of your eye. The lens focuses light inside the eye. When a lens becomes cloudy, your vision is affected. Cataract surgery is a procedure to remove the cloudy lens. A substitute lens (intraocular lens or IOL) is usually inserted as a replacement for the cloudy lens. Tell a health care provider about:  Any allergies you have.  All medicines you are taking, including vitamins, herbs, eye drops, creams, and over-the-counter medicines.  Any problems you or family members have had with anesthetic medicines.  Any blood disorders you have.  Any surgeries you have had, especially eye surgeries that include refractive surgery, such as PRK and LASIK.  Any medical conditions you have.  Whether you are pregnant or may be pregnant. What are the risks? Generally, this is a safe procedure. However, problems may occur, including:  Infection.  Bleeding.  Glaucoma.  Retinal detachment.  Allergic reactions to medicines.  Damage to other structures or organs.  Inflammation of the eye.  Clouding of the part of your eye that holds an IOL in place (after-cataract), if an IOL was inserted. This is fairly common.  An IOL moving out of position, if an IOL was inserted. This is very rare.  Loss of vision. This is rare.  What happens before the procedure?  Follow instructions from your health care provider about eating or drinking restrictions.  Ask your health care  provider about: ? Changing or stopping your regular medicines, including any eye drops you have been prescribed. This is especially important if you are taking diabetes medicines or blood thinners. ? Taking medicines such as aspirin and ibuprofen. These medicines can thin your blood. Do not take these medicines before your procedure if your health care provider instructs you not to.  Do not put contact lenses in either eye on the day of your surgery.  Plan for someone to drive you to and from the procedure.  If you will be going home right after the procedure, plan to have someone with you for 24 hours. What happens during the procedure?  An IV tube may be inserted into one of your veins.  You will be given one or more of the following: ? A medicine to help you relax (sedative). ? A medicine to numb the area (local anesthetic). This may be numbing eye drops or an injection that is given behind the eye.  A small cut (incision) will be made to the edge of the clear, dome-shaped surface that covers the front of the eye (cornea).  A small probe will be inserted into the eye. This device gives off ultrasound waves that soften and break up the cloudy center of the lens. This makes it easier for the cloudy lens to be removed by suction.  An IOL may be implanted.  Part of the capsule that surrounds the lens will be left in the eye to support the IOL.  Your surgeon may use stitches (sutures) to close the incision. The procedure may vary among health care providers and hospitals. What happens after the procedure?  Your blood pressure, heart rate, breathing rate, and blood oxygen level will be monitored often until the medicines you were given have worn off.  You may be given a protective shield to wear over your eyes.  Do not drive for 24 hours if you received a sedative. This information is not intended to replace advice given to you by your health care provider. Make sure you discuss any  questions you have with your health care provider. Document Released: 02/25/2011 Document Revised: 08/14/2015 Document Reviewed: 01/16/2015 Elsevier Interactive Patient Education  2017 [employer].

## 2016-08-25 ENCOUNTER — Other Ambulatory Visit: Payer: Self-pay

## 2016-08-25 ENCOUNTER — Encounter (HOSPITAL_COMMUNITY)
Admission: RE | Admit: 2016-08-25 | Discharge: 2016-08-25 | Disposition: A | Discharge status: Home / Self Care | Payer: Medicare Other | Source: Ambulatory Visit | Attending: Ophthalmology | Admitting: Ophthalmology

## 2016-08-25 MED ORDER — IMIPRAMINE HCL 50 MG PO TABS: 100.0000 mg | ORAL_TABLET | Freq: Every day | ORAL | 1 refills | Status: DC

## 2016-08-25 NOTE — Telephone Encounter (Signed)
Yes that is fine

## 2016-08-25 NOTE — Telephone Encounter (Signed)
Done

## 2016-08-25 NOTE — Telephone Encounter (Signed)
Do you agree with this change?

## 2016-08-30 DIAGNOSIS — E669 Obesity, unspecified: Secondary | ICD-10-CM | POA: Diagnosis not present

## 2016-08-30 DIAGNOSIS — I10 Essential (primary) hypertension: Secondary | ICD-10-CM | POA: Diagnosis not present

## 2016-08-30 DIAGNOSIS — C73 Malignant neoplasm of thyroid gland: Secondary | ICD-10-CM | POA: Diagnosis not present

## 2016-08-30 DIAGNOSIS — E039 Hypothyroidism, unspecified: Secondary | ICD-10-CM | POA: Diagnosis not present

## 2016-08-30 DIAGNOSIS — E785 Hyperlipidemia, unspecified: Secondary | ICD-10-CM | POA: Diagnosis not present

## 2016-08-30 DIAGNOSIS — E1165 Type 2 diabetes mellitus with hyperglycemia: Secondary | ICD-10-CM | POA: Diagnosis not present

## 2016-08-30 DIAGNOSIS — E1159 Type 2 diabetes mellitus with other circulatory complications: Secondary | ICD-10-CM | POA: Diagnosis not present

## 2016-09-10 ENCOUNTER — Encounter (HOSPITAL_COMMUNITY): Payer: Self-pay

## 2016-09-10 ENCOUNTER — Encounter (HOSPITAL_COMMUNITY)
Admission: RE | Admit: 2016-09-10 | Discharge: 2016-09-10 | Disposition: A | Discharge status: Home / Self Care | Payer: Medicare Other | Source: Ambulatory Visit | Attending: Ophthalmology | Admitting: Ophthalmology

## 2016-09-13 DIAGNOSIS — M545 Low back pain: Secondary | ICD-10-CM | POA: Diagnosis not present

## 2016-09-13 DIAGNOSIS — Z6832 Body mass index (BMI) 32.0-32.9, adult: Secondary | ICD-10-CM | POA: Diagnosis not present

## 2016-09-13 DIAGNOSIS — M5416 Radiculopathy, lumbar region: Secondary | ICD-10-CM | POA: Diagnosis not present

## 2016-09-13 DIAGNOSIS — M4120 Other idiopathic scoliosis, site unspecified: Secondary | ICD-10-CM | POA: Diagnosis not present

## 2016-09-13 DIAGNOSIS — I10 Essential (primary) hypertension: Secondary | ICD-10-CM | POA: Diagnosis not present

## 2016-09-17 ENCOUNTER — Ambulatory Visit (HOSPITAL_COMMUNITY)
Admission: RE | Admit: 2016-09-17 | Discharge: 2016-09-17 | Disposition: A | Discharge status: Home / Self Care | Payer: Medicare Other | Source: Ambulatory Visit | Attending: Ophthalmology | Admitting: Ophthalmology

## 2016-09-17 ENCOUNTER — Ambulatory Visit (HOSPITAL_COMMUNITY): Payer: Medicare Other | Admitting: Anesthesiology

## 2016-09-17 ENCOUNTER — Encounter (HOSPITAL_COMMUNITY): Payer: Self-pay

## 2016-09-17 ENCOUNTER — Encounter (HOSPITAL_COMMUNITY)
Admission: RE | Disposition: A | Discharge status: Home / Self Care | Payer: Self-pay | Source: Ambulatory Visit | Attending: Ophthalmology

## 2016-09-17 DIAGNOSIS — I2510 Atherosclerotic heart disease of native coronary artery without angina pectoris: Secondary | ICD-10-CM | POA: Diagnosis not present

## 2016-09-17 DIAGNOSIS — I10 Essential (primary) hypertension: Secondary | ICD-10-CM | POA: Diagnosis not present

## 2016-09-17 DIAGNOSIS — K219 Gastro-esophageal reflux disease without esophagitis: Secondary | ICD-10-CM | POA: Insufficient documentation

## 2016-09-17 DIAGNOSIS — H269 Unspecified cataract: Secondary | ICD-10-CM | POA: Diagnosis not present

## 2016-09-17 DIAGNOSIS — Z79899 Other long term (current) drug therapy: Secondary | ICD-10-CM | POA: Insufficient documentation

## 2016-09-17 DIAGNOSIS — H25812 Combined forms of age-related cataract, left eye: Secondary | ICD-10-CM | POA: Diagnosis not present

## 2016-09-17 DIAGNOSIS — F329 Major depressive disorder, single episode, unspecified: Secondary | ICD-10-CM | POA: Insufficient documentation

## 2016-09-17 DIAGNOSIS — F419 Anxiety disorder, unspecified: Secondary | ICD-10-CM | POA: Insufficient documentation

## 2016-09-17 DIAGNOSIS — E039 Hypothyroidism, unspecified: Secondary | ICD-10-CM | POA: Diagnosis not present

## 2016-09-17 DIAGNOSIS — E119 Type 2 diabetes mellitus without complications: Secondary | ICD-10-CM | POA: Diagnosis not present

## 2016-09-17 DIAGNOSIS — Z794 Long term (current) use of insulin: Secondary | ICD-10-CM | POA: Insufficient documentation

## 2016-09-17 HISTORY — PX: CATARACT EXTRACTION W/PHACO: SHX586

## 2016-09-17 LAB — GLUCOSE, CAPILLARY: Glucose-Capillary: 183 mg/dL — ABNORMAL HIGH (ref 65–99)

## 2016-09-17 SURGERY — PHACOEMULSIFICATION, CATARACT, WITH IOL INSERTION
Anesthesia: Monitor Anesthesia Care | Site: Eye | Laterality: Left

## 2016-09-17 MED ORDER — EPINEPHRINE PF 1 MG/ML IJ SOLN
INTRAOCULAR | Status: DC | PRN
  Administered 2016-09-17: 500 mL

## 2016-09-17 MED ORDER — BSS IO SOLN
INTRAOCULAR | Status: DC | PRN
  Administered 2016-09-17: 30 mL via INTRAOCULAR

## 2016-09-17 MED ORDER — CYCLOPENTOLATE-PHENYLEPHRINE 0.2-1 % OP SOLN
1.0000 [drp] | OPHTHALMIC | Status: AC
  Administered 2016-09-17: 1 [drp] via OPHTHALMIC

## 2016-09-17 MED ORDER — POVIDONE-IODINE 5 % OP SOLN
OPHTHALMIC | Status: DC | PRN
  Administered 2016-09-17: 1 via OPHTHALMIC

## 2016-09-17 MED ORDER — LIDOCAINE HCL 3.5 % OP GEL: 1.0000 "application " | Freq: Once | OPHTHALMIC | Status: DC

## 2016-09-17 MED ORDER — MIDAZOLAM HCL 2 MG/2ML IJ SOLN
INTRAMUSCULAR | Status: AC
  Filled 2016-09-17: qty 2

## 2016-09-17 MED ORDER — PHENYLEPHRINE HCL 2.5 % OP SOLN: 1.0000 [drp] | OPHTHALMIC | Status: AC

## 2016-09-17 MED ORDER — PHENYLEPHRINE HCL 2.5 % OP SOLN
1.0000 [drp] | OPHTHALMIC | Status: AC
  Administered 2016-09-17 (×2): 1 [drp] via OPHTHALMIC

## 2016-09-17 MED ORDER — CYCLOPENTOLATE-PHENYLEPHRINE 0.2-1 % OP SOLN: 1.0000 [drp] | OPHTHALMIC | Status: AC

## 2016-09-17 MED ORDER — MIDAZOLAM HCL 2 MG/2ML IJ SOLN
1.0000 mg | Freq: Once | INTRAMUSCULAR | Status: AC | PRN
  Administered 2016-09-17: 1 mg via INTRAVENOUS

## 2016-09-17 MED ORDER — MIDAZOLAM HCL 2 MG/2ML IJ SOLN: 1.0000 mg | Freq: Once | INTRAMUSCULAR | Status: DC | PRN

## 2016-09-17 MED ORDER — LIDOCAINE HCL 3.5 % OP GEL
1.0000 "application " | Freq: Once | OPHTHALMIC | Status: AC
  Administered 2016-09-17: 1 via OPHTHALMIC

## 2016-09-17 MED ORDER — PROVISC 10 MG/ML IO SOLN
INTRAOCULAR | Status: DC | PRN
  Administered 2016-09-17: 0.85 mL via INTRAOCULAR

## 2016-09-17 MED ORDER — TETRACAINE HCL 0.5 % OP SOLN: 1.0000 [drp] | OPHTHALMIC | Status: AC

## 2016-09-17 MED ORDER — TETRACAINE HCL 0.5 % OP SOLN
1.0000 [drp] | OPHTHALMIC | Status: AC
  Administered 2016-09-17 (×2): 1 [drp] via OPHTHALMIC

## 2016-09-17 MED ORDER — LIDOCAINE HCL 3.5 % OP GEL
OPHTHALMIC | Status: DC | PRN
  Administered 2016-09-17: 1 via OPHTHALMIC

## 2016-09-17 MED ORDER — LIDOCAINE HCL (PF) 1 % IJ SOLN
INTRAMUSCULAR | Status: DC | PRN
  Administered 2016-09-17: 1.5 mL

## 2016-09-17 MED ORDER — LACTATED RINGERS IV SOLN
INTRAVENOUS | Status: DC
  Administered 2016-09-17 (×2): via INTRAVENOUS

## 2016-09-17 MED ORDER — MIDAZOLAM HCL 5 MG/5ML IJ SOLN
INTRAMUSCULAR | Status: DC | PRN
  Administered 2016-09-17 (×2): 1 mg via INTRAVENOUS

## 2016-09-17 MED ORDER — NEOMYCIN-POLYMYXIN-DEXAMETH 3.5-10000-0.1 OP SUSP
OPHTHALMIC | Status: DC | PRN
  Administered 2016-09-17: 2 [drp] via OPHTHALMIC

## 2016-09-17 SURGICAL SUPPLY — 10 items
CLOTH BEACON ORANGE TIMEOUT ST (SAFETY) ×2 IMPLANT
EYE SHIELD UNIVERSAL CLEAR (GAUZE/BANDAGES/DRESSINGS) ×2 IMPLANT
GLOVE BIOGEL PI IND STRL 7.0 (GLOVE) IMPLANT
GLOVE BIOGEL PI INDICATOR 7.0 (GLOVE) ×2
PAD ARMBOARD 7.5X6 YLW CONV (MISCELLANEOUS) ×2 IMPLANT
SIGHTPATH CAT PROC W REG LENS (Ophthalmic Related) ×3 IMPLANT
SYRINGE LUER LOK 1CC (MISCELLANEOUS) ×2 IMPLANT
TAPE SURG TRANSPORE 1 IN (GAUZE/BANDAGES/DRESSINGS) IMPLANT
TAPE SURGICAL TRANSPORE 1 IN (GAUZE/BANDAGES/DRESSINGS) ×2
WATER STERILE IRR 250ML POUR (IV SOLUTION) ×2 IMPLANT

## 2016-09-17 NOTE — Anesthesia Postprocedure Evaluation (Signed)
Anesthesia Post Note  Patient: Jennifer Blackburn  Procedure(s) Performed: Procedure(s) (LRB): CATARACT EXTRACTION PHACO AND INTRAOCULAR LENS PLACEMENT LEFT EYE (Left)  Patient location during evaluation: Short Stay Anesthesia Type: MAC Level of consciousness: awake and alert and oriented Pain management: pain level controlled Vital Signs Assessment: post-procedure vital signs reviewed and stable Respiratory status: spontaneous breathing Cardiovascular status: blood pressure returned to baseline Postop Assessment: no signs of nausea or vomiting Anesthetic complications: no     Last Vitals:  Vitals:   09/17/16 0815  BP: 131/63  Pulse: 64  Resp: 16  Temp: 37.1 C    Last Pain:  Vitals:   09/17/16 0815  TempSrc: Oral                 [name]

## 2016-09-17 NOTE — Op Note (Signed)
Date of Admission: 09/17/2016  Date of Surgery: 09/17/2016  Pre-Op Dx: Cataract Left  Eye  Post-Op Dx: Senile Combined Cataract  Left  Eye,  Dx Code J57.017  Surgeon: Tonny Branch, M.D.  Assistants: None  Anesthesia: Topical with MAC  Indications: Painless, progressive loss of vision with compromise of daily activities.  Surgery: Cataract Extraction with Intraocular lens Implant Left Eye  Discription: The patient had dilating drops and viscous lidocaine placed into the Left eye in the pre-op holding area. After transfer to the operating room, a time out was performed. The patient was then prepped and draped. Beginning with a 93m blade a paracentesis port was made at the surgeon's 2 o'clock position. The anterior chamber was then filled with 1% non-preserved lidocaine. This was followed by filling the anterior chamber with Provisc.  A 2.446mkeratome blade was used to make a clear corneal incision at the temporal limbus.  A bent cystatome needle was used to create a continuous tear capsulotomy. Hydrodissection was performed with balanced salt solution on a Fine canula. The lens nucleus was then removed using the phacoemulsification handpiece. Residual cortex was removed with the I&A handpiece. The anterior chamber and capsular bag were refilled with Provisc. A posterior chamber intraocular lens was placed into the capsular bag with it's injector. The implant was positioned with the Kuglan hook. The Provisc was then removed from the anterior chamber and capsular bag with the I&A handpiece. Stromal hydration of the main incision and paracentesis port was performed with BSS on a Fine canula. The wounds were tested for leak which was negative. The patient tolerated the procedure well. There were no operative complications. The patient was then transferred to the recovery room in stable condition.  Complications: None  Specimen: None  EBL: None  Prosthetic device: Abbott Technis, PCB00, power 17.5, SN  [id number]

## 2016-09-17 NOTE — H&P (Signed)
I have reviewed the H&P, the patient was re-examined, and I have identified no interval changes in medical condition and plan of care since the history and physical of record  

## 2016-09-17 NOTE — Anesthesia Preprocedure Evaluation (Signed)
Anesthesia Evaluation  Patient identified by MRN, date of birth, ID band  History of Anesthesia Complications (+) PONV  Airway Mallampati: I  TM Distance: >3 FB Neck ROM: Full    Dental  (+) Poor Dentition   Pulmonary    Pulmonary exam normal        Cardiovascular hypertension, Pt. on medications + CAD and + Peripheral Vascular Disease  Normal cardiovascular exam Rhythm:Regular Rate:Normal     Neuro/Psych  Headaches, Anxiety Depression  Neuromuscular disease    GI/Hepatic GERD  ,  Endo/Other  diabetes, Well Controlled, Type 2Hypothyroidism   Renal/GU      Musculoskeletal  (+) Arthritis ,   Abdominal Normal abdominal exam  (+)   Peds  Hematology  (+) anemia ,   Anesthesia Other Findings   Reproductive/Obstetrics                             Anesthesia Physical Anesthesia Plan  ASA: III  Anesthesia Plan: MAC   Post-op Pain Management:    Induction: Intravenous  PONV Risk Score and Plan:   Airway Management Planned: Nasal Cannula  Additional Equipment:   Intra-op Plan:   Post-operative Plan:   Informed Consent: I have reviewed the patients History and Physical, chart, labs and discussed the procedure including the risks, benefits and alternatives for the proposed anesthesia with the patient or authorized representative who has indicated his/her understanding and acceptance.     Plan Discussed with: CRNA  Anesthesia Plan Comments:         Anesthesia Quick Evaluation

## 2016-09-17 NOTE — Discharge Instructions (Signed)
Moderate Conscious Sedation, Adult, Care After  These instructions provide you with information about caring for yourself after your procedure. Your health care provider may also give you more specific instructions. Your treatment has been planned according to current medical practices, but problems sometimes occur. Call your health care provider if you have any problems or questions after your procedure.  What can I expect after the procedure?  After your procedure, it is common:   To feel sleepy for several hours.   To feel clumsy and have poor balance for several hours.   To have poor judgment for several hours.   To vomit if you eat too soon.    Follow these instructions at home:  For at least 24 hours after the procedure:     Do not:  ? Participate in activities where you could fall or become injured.  ? Drive.  ? Use heavy machinery.  ? Drink alcohol.  ? Take sleeping pills or medicines that cause drowsiness.  ? Make important decisions or sign legal documents.  ? Take care of children on your own.   Rest.  Eating and drinking   Follow the diet recommended by your health care provider.   If you vomit:  ? Drink water, juice, or soup when you can drink without vomiting.  ? Make sure you have little or no nausea before eating solid foods.  General instructions   Have a responsible adult stay with you until you are awake and alert.   Take over-the-counter and prescription medicines only as told by your health care provider.   If you smoke, do not smoke without supervision.   Keep all follow-up visits as told by your health care provider. This is important.  Contact a health care provider if:   You keep feeling nauseous or you keep vomiting.   You feel light-headed.   You develop a rash.   You have a fever.  Get help right away if:   You have trouble breathing.  This information is not intended to replace advice given to you by your health care provider. Make sure you discuss any questions you have  with your health care provider.  Document Released: 12/27/2012 Document Revised: 08/11/2015 Document Reviewed: 06/28/2015  Elsevier Interactive Patient Education  2018 Elsevier Inc.

## 2016-09-17 NOTE — Transfer of Care (Signed)
Immediate Anesthesia Transfer of Care Note  Patient: Jennifer Blackburn  Procedure(s) Performed: Procedure(s) with comments: CATARACT EXTRACTION PHACO AND INTRAOCULAR LENS PLACEMENT LEFT EYE (Left) - CDE: 19.23  Patient Location: PACU  Anesthesia Type:MAC  Level of Consciousness: awake  Airway & Oxygen Therapy: Patient Spontanous Breathing  Post-op Assessment: Report given to RN  Post vital signs: Reviewed and stable  Last Vitals:  Vitals:   09/17/16 0815  BP: 131/63  Pulse: 64  Resp: 16  Temp: 37.1 C    Last Pain:  Vitals:   09/17/16 0815  TempSrc: Oral         Complications: No apparent anesthesia complications

## 2016-09-29 ENCOUNTER — Telehealth: Payer: Self-pay | Admitting: Family Medicine

## 2016-09-29 NOTE — Telephone Encounter (Signed)
Cannot treat without seeing.  Eye drainage is rarely related to sinus.  Would use a liquid tears for the irritation.

## 2016-09-29 NOTE — Telephone Encounter (Signed)
Patient informed of message below, verbalized understanding. Appt scheduled with Dr Meda Coffee

## 2016-09-29 NOTE — Telephone Encounter (Signed)
Patient had L eye surgery June 29th (cat removal) and now R eye swelling up every morning.  She thinks it is a sinus infection. It's oozing and feels "yucky".  She mentions that she's had this one other time and Dr Moshe Cipro prescribed something that cleared it up.  She is aware that Dr Moshe Cipro is not here this week and no doctor available this afternoon.  Please advise.  No open slots for her doctor Monday or Tuesday.    Please try her on mobile # first

## 2016-09-30 DIAGNOSIS — E1165 Type 2 diabetes mellitus with hyperglycemia: Secondary | ICD-10-CM | POA: Diagnosis not present

## 2016-09-30 DIAGNOSIS — C73 Malignant neoplasm of thyroid gland: Secondary | ICD-10-CM | POA: Diagnosis not present

## 2016-10-05 ENCOUNTER — Ambulatory Visit: Payer: Medicare Other | Admitting: Family Medicine

## 2016-10-28 DIAGNOSIS — E669 Obesity, unspecified: Secondary | ICD-10-CM | POA: Diagnosis not present

## 2016-10-28 DIAGNOSIS — E1159 Type 2 diabetes mellitus with other circulatory complications: Secondary | ICD-10-CM | POA: Diagnosis not present

## 2016-10-28 DIAGNOSIS — I10 Essential (primary) hypertension: Secondary | ICD-10-CM | POA: Diagnosis not present

## 2016-10-28 DIAGNOSIS — E785 Hyperlipidemia, unspecified: Secondary | ICD-10-CM | POA: Diagnosis not present

## 2016-10-28 DIAGNOSIS — E1165 Type 2 diabetes mellitus with hyperglycemia: Secondary | ICD-10-CM | POA: Diagnosis not present

## 2016-10-28 DIAGNOSIS — E039 Hypothyroidism, unspecified: Secondary | ICD-10-CM | POA: Diagnosis not present

## 2016-10-28 DIAGNOSIS — C73 Malignant neoplasm of thyroid gland: Secondary | ICD-10-CM | POA: Diagnosis not present

## 2016-11-01 ENCOUNTER — Telehealth: Payer: Self-pay

## 2016-11-01 DIAGNOSIS — H25812 Combined forms of age-related cataract, left eye: Secondary | ICD-10-CM | POA: Diagnosis not present

## 2016-11-01 DIAGNOSIS — D539 Nutritional anemia, unspecified: Secondary | ICD-10-CM

## 2016-11-01 DIAGNOSIS — E113293 Type 2 diabetes mellitus with mild nonproliferative diabetic retinopathy without macular edema, bilateral: Secondary | ICD-10-CM | POA: Diagnosis not present

## 2016-11-01 DIAGNOSIS — E11319 Type 2 diabetes mellitus with unspecified diabetic retinopathy without macular edema: Secondary | ICD-10-CM | POA: Diagnosis not present

## 2016-11-01 NOTE — Telephone Encounter (Signed)
Patient called and stated Dr Bary Leriche did labs on her recently and was told to call her pcp because her iron levels were very low. Sent urgent request to Dr Nicki Reaper office for them to fax lab results to [place]. Will let patient know.

## 2016-11-03 NOTE — Telephone Encounter (Signed)
Dr Bary Leriche will fax the labs over now for dr to review

## 2016-11-03 NOTE — Telephone Encounter (Signed)
Called [facility] office and they tried faxing over the labs but they wouldn't show up so they verbally gave me the results Hemoglobin- 10.6 Hematocrit- 32.5 Iron 39  Wants to know if she needs any iron supplements

## 2016-11-04 ENCOUNTER — Other Ambulatory Visit: Payer: Self-pay | Admitting: Family Medicine

## 2016-11-04 DIAGNOSIS — Z1231 Encounter for screening mammogram for malignant neoplasm of breast: Secondary | ICD-10-CM

## 2016-11-04 NOTE — Telephone Encounter (Signed)
Ordered and will give to her tomorrow

## 2016-11-04 NOTE — Telephone Encounter (Signed)
I spoke with the pt and advise her to start oTC iron 325 mg one twice daily She is to come tomorrow Friday to collect three stool cards with directions to return for fOB testing next week, please give them to her and explain  She will also need a lab order to get CBC and anemia panel 3 days before her octoiber appt with me, pls also give that to her

## 2016-11-04 NOTE — Addendum Note (Signed)
Addended by: Eual Fines on: 11/04/2016 10:12 AM   Modules accepted: Orders

## 2016-11-05 ENCOUNTER — Telehealth: Payer: Self-pay | Admitting: Family Medicine

## 2016-11-05 DIAGNOSIS — E1159 Type 2 diabetes mellitus with other circulatory complications: Secondary | ICD-10-CM

## 2016-11-05 NOTE — Telephone Encounter (Signed)
Patient was asking if you can assist her in finding an affordable Rx. Tuojeu is too expensive and she is forced to go off of this.

## 2016-11-08 NOTE — Telephone Encounter (Signed)
TESTING

## 2016-11-11 ENCOUNTER — Ambulatory Visit (HOSPITAL_COMMUNITY)
Admission: RE | Admit: 2016-11-11 | Discharge: 2016-11-11 | Disposition: A | Discharge status: Home / Self Care | Payer: Medicare Other | Source: Ambulatory Visit | Attending: Family Medicine | Admitting: Family Medicine

## 2016-11-11 DIAGNOSIS — Z1231 Encounter for screening mammogram for malignant neoplasm of breast: Secondary | ICD-10-CM | POA: Insufficient documentation

## 2016-11-12 ENCOUNTER — Telehealth: Payer: Self-pay | Admitting: Family Medicine

## 2016-11-12 NOTE — Telephone Encounter (Signed)
Patient left message on nurse line. She states a receptionist told her last Friday someone would call her Monday regarding her diabetic medications, and she has not heard anything yet. She states that she is in 'the gap' again and cannot afford both the [place]. She states that Dr. Moshe Cipro has given her the name of a lady in [place] in the past, and she would like that contact number again to see if she could help.   Callback# [phone]

## 2016-11-12 NOTE — Telephone Encounter (Signed)
Person who used to do this was Denyse Amass, (unfortunately no longer living) However , there is a new person doing this I believe, you will need to check with one of the other nurses and have that info for future reference as well as how the process is carried out Also the [place],  IS our resource for on site, as in , "in home " assistance for patient needs, as we just discussed, I will do some  Groundwork on that and get back to you. Give pt a call and let her knwo that you are trying to get her linked to someone who can help her please

## 2016-11-12 NOTE — Telephone Encounter (Signed)
Patient informed of message below, verbalized understanding.  

## 2016-11-16 ENCOUNTER — Other Ambulatory Visit: Payer: Self-pay | Admitting: Family Medicine

## 2016-11-16 ENCOUNTER — Telehealth: Payer: Self-pay | Admitting: Family Medicine

## 2016-11-16 DIAGNOSIS — E1159 Type 2 diabetes mellitus with other circulatory complications: Secondary | ICD-10-CM

## 2016-11-16 NOTE — Telephone Encounter (Signed)
Called patient to inform of referral to [place], [facility]. Left message.

## 2016-11-17 ENCOUNTER — Other Ambulatory Visit (INDEPENDENT_AMBULATORY_CARE_PROVIDER_SITE_OTHER): Payer: Medicare Other

## 2016-11-17 DIAGNOSIS — Z1211 Encounter for screening for malignant neoplasm of colon: Secondary | ICD-10-CM | POA: Diagnosis not present

## 2016-11-17 LAB — HEMOCCULT GUIAC POC 1CARD (OFFICE)
Card #2 Fecal Occult Blod, POC: NEGATIVE
Card #3 Fecal Occult Blood, POC: NEGATIVE
Fecal Occult Blood, POC: NEGATIVE

## 2016-11-17 NOTE — Telephone Encounter (Signed)
Patient stopped by office today and was informed.

## 2016-12-07 DIAGNOSIS — E1142 Type 2 diabetes mellitus with diabetic polyneuropathy: Secondary | ICD-10-CM | POA: Diagnosis not present

## 2016-12-07 DIAGNOSIS — M2040 Other hammer toe(s) (acquired), unspecified foot: Secondary | ICD-10-CM | POA: Diagnosis not present

## 2016-12-08 ENCOUNTER — Other Ambulatory Visit: Payer: Self-pay

## 2016-12-08 NOTE — Patient Outreach (Signed)
[facility]) Care Management  12/08/2016  Jennifer Blackburn [birth year] [id number]   Telephone Screen  Referral Date: 12/07/16 Referral Source: MD office(Dr. Moshe Cipro) Referral Reason: "patietn needs assistance affording her diabetes meds(Tradjenta & Toujeo). She is in the donut hole and is not taking her meds everyday because she can't afford it." Insurance: Medicare   Outreach attempt # 1 to patient. Spoke briefly with patient who reported she was busy at present and would call this RN CM back.      Plan: RN CM will make outreach attempt to patient within three business days if no return call from patient.   Enzo Montgomery, RN,BSN,CCM [place] [facility] Telephonic Care Management Coordinator Direct Phone: [phone] Toll Free: [phone] Fax: [phone]

## 2016-12-08 NOTE — Patient Outreach (Addendum)
[facility]) Care Management  12/08/2016  Jennifer Blackburn 1946/01/26 [id number]   Telephone Screen  Referral Date: 12/07/16 Referral Source: MD office(Dr. Moshe Cipro) Referral Reason: "patietn needs assistance affording her diabetes meds(Tradjenta & Toujeo). She is in the donut hole and is not taking her meds everyday because she can't afford it." Insurance: Medicare    Incoming call from patient. Screening completed.   Social: Patient resides in her home along with her spouse. She voices that she is independent with ADLs/IADLs. She denies any recent falls. She voices she does not use any assistive devices. She drives herself to medical appts. DME in the home include cbg meter.   Conditions: Per chart reviewed patient has PMH of DM, HLD, obesity, HTN, hypothyroidism and anxiety/depression. Patient voices that she is checking her blood sugars about 1-2x/day. e reports cbgs range in the 100s-200s. She voices that since she has not been taking her meds as directed her blood sugars have been slightly higher than normal. Most recent A1C was 8.3(July 2018).  She does not monitor BP in the home as she states it is "controlled."   Medications: She voices that she that she is taking about 12 meds. She reports that she got into the donut hole the end of July/beginning of August. She states that she ran out pf [place] about two weeks ago. She still has one sample left of Toujeo that she got from MD office. She voices that she has been trying to rationalize med out and so she has only been taking it every other day instead of daily. [facility] pharmacy referral already pending.    Appointments: She is followed by PCP as well as cardiologist(Dr. Wynetta Emery) and Dr. Carter(opthalmalogist).   Advance Directives: None. Declined info.    Consent: [facility] services reviewed and discussed with patient. Verbal consent for services given. She has no RN CM care coordination needs. She is agreeable to [place] for further diabetes education and support.    Plan: RN CM will notify [facility] administrative assistant of case status. RN CM will send [place] for further disease management, education and support.    Enzo Montgomery, RN,BSN,CCM [place] [facility] Telephonic Care Management Coordinator Direct Phone: [phone] Toll Free: [phone] Fax: [phone]

## 2016-12-09 ENCOUNTER — Other Ambulatory Visit: Payer: Self-pay

## 2016-12-09 NOTE — Patient Outreach (Signed)
[facility]) Care Management  [place]  12/09/2016   GLENDON FISER 10-15-1945 [id number]   Subjective: RN Health Coach spoke with the patient and HIPAA verified.The patient states that she lives with her husband. She states that she is able to perform her ADLS/IADLS. She states she is able to drive. The patient states she has not had any falls but sometimes she will walk with a cane. The patient states that she tries to check her blood sugars everyday but has been having problems with her meter and has felt her blood sugars have been fine. Patient states her blood sugar this morning was 101. RN Health Coach discussed steps to check her glucometer. Patient verbalized understanding. Patient states that she has been having problems purchasing her insulin.  A referral has been placed.  The patient has some samples that she received from her doctor's office.  Objective:   Encounter Medications:  Outpatient Encounter Prescriptions as of 12/09/2016  Medication Sig Note  . aspirin EC 81 MG tablet Take 1 tablet (81 mg total) by mouth daily.   . fluticasone (FLONASE) 50 MCG/ACT nasal spray Place 2 sprays into both nostrils daily. (Patient taking differently: Place 2 sprays into both nostrils daily as needed for allergies. )   . gabapentin (NEURONTIN) 100 MG capsule TAKE 1 CAPSULE(100 MG) BY MOUTH THREE TIMES DAILY (Patient taking differently: Take 100 mg by mouth at bedtime. )   . glucose blood (ACCU-CHEK AVIVA PLUS) test strip Use as instructed four times daily dx E11.65   . imipramine (TOFRANIL) 25 MG tablet TAKE 4 TABLETS BY MOUTH EVERY NIGHT AT BEDTIME (Patient taking differently: Take 25-50 mg by mouth at bedtime. )   . Insulin Glargine (TOUJEO SOLOSTAR) 300 UNIT/ML SOPN Inject 70 Units into the skin at bedtime.    . Insulin Pen Needle (B-D ULTRAFINE III SHORT PEN) 31G X 8 MM MISC Use to inject insulin daily   . Lancets (ACCU-CHEK MULTICLIX) lancets Use as instructed  three times daily dx 250.01   . levothyroxine (SYNTHROID) 200 MCG tablet Take 1 tablet (200 mcg total) by mouth daily before breakfast.   . linagliptin (TRADJENTA) 5 MG TABS tablet Take 5 mg by mouth daily.   Marland Kitchen loratadine (CLARITIN) 10 MG tablet Take 10 mg by mouth daily as needed for allergies.    . Menthol, Topical Analgesic, (ICY HOT EX) Apply 1 application topically 3 (three) times daily as needed (for back pain.).   Marland Kitchen metFORMIN (GLUCOPHAGE) 1000 MG tablet Take 1 tablet (1,000 mg total) by mouth 2 (two) times daily.   . metoprolol (LOPRESSOR) 50 MG tablet Take 1 tablet (50 mg total) by mouth 2 (two) times daily.   Marland Kitchen PARoxetine (PAXIL) 20 MG tablet TAKE 1 TABLET BY MOUTH EVERY DAY   . pravastatin (PRAVACHOL) 80 MG tablet Take 1 tablet (80 mg total) by mouth daily. (Patient taking differently: Take 80 mg by mouth at bedtime. )   . PRESCRIPTION MEDICATION Apply 1 drop to eye. Prednisolone-gatifloxacin suspension 08/23/2016: Begin 3 days prior to procedure.  . quinapril (ACCUPRIL) 10 MG tablet Take 1 tablet (10 mg total) by mouth daily.   . verapamil (VERELAN PM) 180 MG 24 hr capsule Take 1 capsule (180 mg total) by mouth daily.   . vitamin B-12 (CYANOCOBALAMIN) 1000 MCG tablet Take 1 tablet (1,000 mcg total) by mouth daily.   . Vitamin D, Ergocalciferol, (DRISDOL) 50000 units CAPS capsule TAKE 1 CAPSULE BY MOUTH EVERY 7 DAYS (Patient taking  differently: Take 50,000 Units by mouth every Wednesday. )   . imipramine (TOFRANIL) 50 MG tablet Take 2 tablets (100 mg total) by mouth at bedtime. (Patient not taking: Reported on 12/09/2016)   . prednisoLONE sodium phosphate (INFLAMASE FORTE) 1 % ophthalmic solution 1 drop. Begin therapy 8 days following procedure. 08/23/2016: Begin 8 day following procedure.   No facility-administered encounter medications on file as of 12/09/2016.     Functional Status:  In your present state of health, do you have any difficulty performing the following activities: 12/09/2016  08/25/2016  Hearing? Y N  Comment patient stated soemtimes she has problems hearing -  Vision? N N  Difficulty concentrating or making decisions? N N  Walking or climbing stairs? N N  Dressing or bathing? N N  Doing errands, shopping? N -  Preparing Food and eating ? Y -  Using the Toilet? Y -  In the past six months, have you accidently leaked urine? N -  Do you have problems with loss of bowel control? N -  Managing your Medications? Y -  Managing your Finances? Y -  Housekeeping or managing your Housekeeping? Y -  Some recent data might be hidden    Fall/Depression Screening: Fall Risk  12/09/2016 12/08/2016 07/14/2016  Falls in the past year? No No No  Number falls in past yr: - - -  Injury with Fall? - - -  Comment - - -  Risk Factor Category  - - -  Risk for fall due to : - - -  Risk for fall due to: Comment - - -   PHQ 2/9 Scores 12/09/2016 12/08/2016 07/14/2016 03/23/2016 02/11/2015 01/13/2015 11/19/2014  PHQ - 2 Score 0 0 0 0 0 0 1  PHQ- 9 Score - - - - - - 5   THN CM Care Plan Problem One     Most Recent Value  Care Plan for Problem One  Active  THN Long Term Goal   Patient will work on lowering her a1c by 0,2 point in 90 days  [place] Term Goal Start Date  12/09/16  Interventions for Problem One [place] will send educational information for reduction of a1c  THN CM Short Term Goal #1   patient will check her blood sugars daily for the next 30 days  THN CM Short Term Goal #1 Start Date  12/09/16  Interventions for Short Term Goal #1  RN Health Coach will reinforce the importance of cheking blood sugars daily  THN CM Short Term Goal #2   Patient will work on her diet to lose two pounds in 30 days   THN CM Short Term Goal #2 Start Date  12/09/16  Interventions for Short Term Goal #2  RN Health Coach will send the patient educational information on diet.      Assessment: Patient will benefit from [facility] for disease management and  support.   Plan: RN Health Coach will provide ongoing education for patient on diabetes through phone calls and sending printed information to patient for further discussion.  RN Health Coach will send welcome packet with consent to patient as well as information on Diabetes.  RN Health Coach will send initial barriers letter, assessment, and care plan to primary care physician. RN Health Coach will contact patient in the month of October and patient agrees to next outreach.  Lazaro Arms RN, BSN, [place] [facility] Direct Dial:  [phone] Fax: [phone]

## 2016-12-14 ENCOUNTER — Other Ambulatory Visit: Payer: Self-pay | Admitting: Pharmacist

## 2016-12-14 NOTE — Patient Outreach (Signed)
[facility]) Care Management  [place]   12/14/2016  ADRINNE SZE [birth year] [id number]  71 year old female referred to [facility] [facility] by Dr. Tula Nakayama.  [place] services requested for medication assistance with Toujeo and Tradjenta and for medication adherence.  PMHx includes, but not limited to, CAD, HTN, HLD, anemia, hypothyroidism, type 2 diabetes with vascular disease, GERD, anxiety, depression, and TMJ arthritis.   Unsuccessful outreach attempt #1 to Ms. Tofte.  I left a HIPAA compliant voicemail on her mobile phone.  No voicemail available on home phone.    Plan: I will follow-up with Ms. Oncale later this week unless I hear back from her beforehand.   Ralene Bathe, PharmD, [place] [phone]

## 2016-12-16 ENCOUNTER — Other Ambulatory Visit: Payer: Self-pay | Admitting: Pharmacist

## 2016-12-16 ENCOUNTER — Ambulatory Visit: Payer: Self-pay | Admitting: Pharmacist

## 2016-12-16 NOTE — Patient Outreach (Signed)
[facility]) Care Management  12/16/2016  ANAJAH STERBENZ Dec 21, 1945 [id number]   Successful telephone outreach call to Ms. Hommes.  She was unable to talk with me today but requested a return phone call tomorrow morning at 10:00AM.   Plan: Call Ms. Busler tomorrow morning to review medication assistance and medication adherence.   Ralene Bathe, PharmD, [place] [phone]

## 2016-12-17 ENCOUNTER — Other Ambulatory Visit: Payer: Self-pay | Admitting: Pharmacist

## 2016-12-17 ENCOUNTER — Ambulatory Visit: Payer: Self-pay | Admitting: Pharmacist

## 2016-12-17 NOTE — Patient Outreach (Addendum)
[facility]) Care Management  12/17/2016  MERRYN THAKER 08/25/1945 [id number]   Unsuccessful telephone call to Ms. Riling today.  I left a HIPPA compliant voicemail on the mobile phone.  I also called her home phone which did not have option to leave a voicemail.      Plan: I will follow-up with Ms. Prevette next week regarding her medications.   Ralene Bathe, PharmD, [place] [phone]   Addendum:  Subjective: Incoming telephone call with Ms. [name].  HIPAA identifiers verified. I explained my reason for calling Ms. Gironda to review eligibility for medication assistance plans and medication reconciliation and she is agreeable to discuss her medications with me telephonically.    Ms. Galvis reports that she is now in the coverage gap with her insurance and can no longer afford her Toujeo or Tradjenta.  She has been using samples from her provider however she ran out of her [place] and is now on her last vial of Toujeo.   She reports that between herself and her husband, estimated annual income is $42,768.  Her insurance is [employer] [id number].    Objective:  Hemoglobin A1C = 8.0 (10/20/15) SCr = 0.69 (07/13/16) Hgb = 11.3 (07/13/16)  Medications Reviewed Today    Reviewed by Lazaro Arms, RN (Registered Nurse) on 12/09/16 at [id number]  Med List Status: <None>  Medication Order Taking? Sig Documenting Provider Last Dose Status Informant  aspirin EC 81 MG tablet [id number] Yes Take 1 tablet (81 mg total) by mouth daily. Penumalli, Earlean Polka, MD Taking Active Self  fluticasone (FLONASE) 50 MCG/ACT nasal spray [id number] Yes Place 2 sprays into both nostrils daily.  Patient taking differently:  Place 2 sprays into both nostrils daily as needed for allergies.    Raylene Everts, MD Taking Active Self  gabapentin (NEURONTIN) 100 MG capsule [id number] Yes TAKE 1 CAPSULE(100 MG) BY MOUTH THREE TIMES DAILY   Patient taking differently:  Take 100 mg by mouth at bedtime.    Fayrene Helper, MD Taking Active Self  glucose blood (ACCU-CHEK AVIVA PLUS) test strip [id number] Yes Use as instructed four times daily dx E11.65 Cassandria Anger, MD Taking Active Self  imipramine (TOFRANIL) 25 MG tablet [id number] Yes TAKE 4 TABLETS BY MOUTH EVERY NIGHT AT BEDTIME  Patient taking differently:  Take 25-50 mg by mouth at bedtime.    Fayrene Helper, MD Taking Active Self  imipramine (TOFRANIL) 50 MG tablet [id number] No Take 2 tablets (100 mg total) by mouth at bedtime.  Patient not taking:  Reported on 12/09/2016   Fayrene Helper, MD Not Taking Active   Insulin Glargine (TOUJEO SOLOSTAR) 300 UNIT/ML SOPN [id number] Yes Inject 70 Units into the skin at bedtime.  [provider] Taking Active Self  Insulin Pen Needle (B-D ULTRAFINE III SHORT PEN) 31G X 8 MM MISC [id number] Yes Use to inject insulin daily Fayrene Helper, MD Taking Active Self  Lancets (ACCU-CHEK MULTICLIX) lancets [id number] Yes Use as instructed three times daily dx 250.01 Fayrene Helper, MD Taking Active Self  levothyroxine (SYNTHROID) 200 MCG tablet [id number] Yes Take 1 tablet (200 mcg total) by mouth daily before breakfast. Fayrene Helper, MD Taking Active Self  linagliptin (TRADJENTA) 5 MG TABS tablet [id number] Yes Take 5 mg by mouth daily. [provider] Taking Active Self  loratadine (CLARITIN) 10 MG tablet [id number] Yes Take 10 mg by mouth daily as needed for allergies.  [provider] Taking Active Self  Menthol, Topical Analgesic, (ICY HOT EX) [id number] Yes Apply 1 application topically 3 (three) times daily as needed (for back pain.). [provider] Taking Active Self  metFORMIN (GLUCOPHAGE) 1000 MG tablet [id number] Yes Take 1 tablet (1,000 mg total) by mouth 2 (two) times daily. Fayrene Helper, MD Taking Active Self  metoprolol (LOPRESSOR) 50 MG tablet [id number] Yes Take  1 tablet (50 mg total) by mouth 2 (two) times daily. Lorretta Harp, MD Taking Active Self  PARoxetine (PAXIL) 20 MG tablet [id number] Yes TAKE 1 TABLET BY MOUTH EVERY DAY Fayrene Helper, MD Taking Active Self  pravastatin (PRAVACHOL) 80 MG tablet [id number] Yes Take 1 tablet (80 mg total) by mouth daily.  Patient taking differently:  Take 80 mg by mouth at bedtime.    Fayrene Helper, MD Taking Active Self  prednisoLONE sodium phosphate (INFLAMASE FORTE) 1 % ophthalmic solution [id number] No 1 drop. Begin therapy 8 days following procedure. [provider] Not Taking Active Self           Med Note CARLETA, WOODROW Aug 23, 2016 12:15 PM) Begin 8 day following procedure.  PRESCRIPTION MEDICATION [id number] Yes Apply 1 drop to eye. Prednisolone-gatifloxacin suspension [provider] Taking Active Self           Med Note KATORA, FINI Aug 23, 2016 12:13 PM) Begin 3 days prior to procedure.  quinapril (ACCUPRIL) 10 MG tablet [id number] Yes Take 1 tablet (10 mg total) by mouth daily. Fayrene Helper, MD Taking Active Self  verapamil (VERELAN PM) 180 MG 24 hr capsule [id number] Yes Take 1 capsule (180 mg total) by mouth daily. Lorretta Harp, MD Taking Active Self  vitamin B-12 (CYANOCOBALAMIN) 1000 MCG tablet [id number] Yes Take 1 tablet (1,000 mcg total) by mouth daily. Fayrene Helper, MD Taking Active Self  Vitamin D, Ergocalciferol, (DRISDOL) 50000 units CAPS capsule [id number] Yes TAKE 1 CAPSULE BY MOUTH EVERY 7 DAYS  Patient taking differently:  Take 50,000 Units by mouth every Wednesday.    Fayrene Helper, MD Taking Active Self         Assessment:  Drugs sorted by system:  Neurologic/Psychologic: gabapentin, imipramine, paroxetine  Cardiovascular: ASA 81mg , metoprolol, pravastatin, quinapril, verapamil  Pulmonary/Allergy: fluticasone NS, loratadine  Endocrine: Toujeo, linagliptin, meformin, levothyroxine  Topical:  difluprednate eye drops, menthol topical analgesic  Vitamins/Minerals:MVI, vitamin B12, vitamin D, ferrous sulfate   Medications to avoid in the elderly:  Imipramine, Paroxetine: Per the Be[place] list, this medications has been identified as a potentially inappropriate medication to be avoided in patients 65 years and older (independent of diagnosis or condition) due to strong anticholinergic properties and potential for sedation and orthostatic hypotension. Per the Beers List, this medication should be avoided in patients with a history of falls and fracture unless no alternatives are available. This medication may produce ataxia, impaired psychomotor function, syncope, and additional falls in such patients.   Per Beers list, this medication has been identified as a potentially inappropriate medication to be used with caution in patients 65 years and older due to the potential to cause or exacerbate syndrome of inappropriate antidiuretic hormone secretion (SIADH) or hyponatremia; monitor sodium concentration closely when initiating or adjusting the dose.This medication, as a single agent or as part of a combination product, has been identified as a high-risk medication in patients 65 years and older on the [place] Use of High-Risk Medications in the  Elderly performance measure.   Monitor closely and adjust as warranted.   Medication assistance: Ms. Shuping does not qualify for Extra Help / LIS due to her combined household income with spouse.   Ms. Kayes does not qualify for Toujeo PAP with [employer] due to combined household income not less than 250% of [employer].    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    Ms Pestka does not qualify for Tradjenta PAP with [employer] due to income requirements.   She does qualify for Sitagliptin through [employer] (No TROOP) and Saxagliptin through [employer] ([place] 3%  annual income)  Ms. Foister will check with her pharmacy and most recent EOB to find out her TROOP.  She believes she may have spent > $1000 this calendar year so far.    Plan: Ms. Rudell will call me on Monday with her [place].  If I do no hear back from her, I will follow-up with her later next week.   Ralene Bathe, PharmD, [place] [phone]

## 2016-12-17 NOTE — Addendum Note (Signed)
Addended by: Rudean Haskell on: 12/17/2016 02:39 PM   Modules accepted: Orders

## 2016-12-20 ENCOUNTER — Ambulatory Visit: Payer: Medicare Other | Admitting: Family Medicine

## 2016-12-21 ENCOUNTER — Ambulatory Visit: Payer: Self-pay | Admitting: Pharmacist

## 2016-12-21 ENCOUNTER — Other Ambulatory Visit: Payer: Self-pay | Admitting: Pharmacist

## 2016-12-21 NOTE — Patient Outreach (Signed)
[place] [facility]) Care Management  12/21/2016  [name] 03/13/1946 [id number]  Unsuccessful telephone call to [name] today.  I left a HIPPA compliant voicemail on the home and mobile phone numbers listed in [place]: I will follow-up with [name] regarding medication assistance later this week.   Ralene Bathe, PharmD, [place] [phone]

## 2016-12-24 ENCOUNTER — Ambulatory Visit: Payer: Self-pay | Admitting: Pharmacist

## 2016-12-24 ENCOUNTER — Other Ambulatory Visit: Payer: Self-pay | Admitting: Pharmacist

## 2016-12-24 NOTE — Patient Outreach (Signed)
[place] [facility]) Care Management  12/24/2016  Jennifer Blackburn 1945-09-04 [id number]  Successful call to [name] today on her mobile number.  HIPAA identifiers verified. [name] has not been able to obtain her TROOP yet.  She will call this weekend for this information and will be in touch with me on Monday.   Plan: I will wait to hear from patient on Monday, Oct 8.  Otherwise I will call her later next week.   Ralene Bathe, PharmD, [place] [phone]

## 2016-12-27 ENCOUNTER — Other Ambulatory Visit: Payer: Self-pay | Admitting: Pharmacist

## 2016-12-27 ENCOUNTER — Other Ambulatory Visit: Payer: Self-pay | Admitting: Cardiovascular Disease

## 2016-12-27 DIAGNOSIS — Z961 Presence of intraocular lens: Secondary | ICD-10-CM | POA: Diagnosis not present

## 2016-12-27 DIAGNOSIS — H20022 Recurrent acute iridocyclitis, left eye: Secondary | ICD-10-CM | POA: Diagnosis not present

## 2016-12-27 NOTE — Patient Outreach (Signed)
[facility]ennifer Blackburn) Care Management  12/27/2016  Jennifer Blackburn June 26, 1945 [id number]   Incoming call received from [name] today. HIPAA identifiers verified. Jennifer Blackburn reports she called her pharmacy for her Jennifer Blackburn and she has not spent $1000 yet this calendar year on medications.  She is appreciative of the information regarding medication assistance and will reach out to me once she has spent $1000 in order to apply for her Jennifer Blackburn and Jennifer Blackburn.  I advised her to continue to request samples from her provider as needed.  Patient denies further medication questions or concerns.   Plan: Pharmacy will close case at this time but am happy to assist in the future as needed. I will alert [facility] RN Jennifer Blackburn of pharmacy case closure.     Jennifer Blackburn, PharmD, [place] [phone]

## 2016-12-27 NOTE — Telephone Encounter (Signed)
Rx request sent to pharmacy.  

## 2017-01-03 ENCOUNTER — Ambulatory Visit (INDEPENDENT_AMBULATORY_CARE_PROVIDER_SITE_OTHER): Payer: Medicare Other

## 2017-01-03 VITALS — BP 138/66 | HR 66 | Temp 98.6°F | Ht 65.0 in | Wt 202.1 lb

## 2017-01-03 DIAGNOSIS — Z0000 Encounter for general adult medical examination without abnormal findings: Secondary | ICD-10-CM

## 2017-01-03 NOTE — Progress Notes (Signed)
Subjective:   Jennifer Blackburn is a 71 y.o. female who presents for Medicare Annual (Subsequent) preventive examination.  Review of Systems:  Cardiac Risk Factors include: advanced age (>54men, >69 women);diabetes mellitus;hypertension;dyslipidemia;obesity (BMI >30kg/m2);sedentary lifestyle;smoking/ tobacco exposure     Objective:     Vitals: BP 138/66   Pulse 66   Temp 98.6 F (37 C) (Temporal)   Ht 5\' 5"  (1.651 m)   Wt 202 lb 1.9 oz (91.7 kg)   LMP 11/11/2016   SpO2 98%   BMI 33.63 kg/m   Body mass index is 33.63 kg/m.   Tobacco History  Smoking Status  . Passive Smoke Exposure - Never Smoker  Smokeless Tobacco  . Never Used    Comment: Husband smokes in the home     Counseling given: Not Answered   Past Medical History:  Diagnosis Date  . Anxiety   . CAD (coronary artery disease)   . Depression   . Diabetes mellitus type II    without complication  . DJD (degenerative joint disease) of lumbar spine   . Hypercholesteremia   . Hyperlipidemia   . Hypertension    benign   . Thyroid cancer ([place]) 2001   Past Surgical History:  Procedure Laterality Date  . ABDOMINAL HYSTERECTOMY    . CARDIAC CATHETERIZATION    . CATARACT EXTRACTION W/PHACO Right 07/17/2012   Procedure: CATARACT EXTRACTION PHACO AND INTRAOCULAR LENS PLACEMENT (IOC);  Surgeon: Tonny Branch, MD;  Location: [facility];  Service: Ophthalmology;  Laterality: Right;  CDE:25.51  . CATARACT EXTRACTION W/PHACO Left 09/17/2016   Procedure: CATARACT EXTRACTION PHACO AND INTRAOCULAR LENS PLACEMENT LEFT EYE;  Surgeon: Tonny Branch, MD;  Location: [facility];  Service: Ophthalmology;  Laterality: Left;  CDE: 19.23  . COLONOSCOPY    . COLONOSCOPY N/A 01/15/2014   Procedure: COLONOSCOPY;  Surgeon: Daneil Dolin, MD;  Location: [facility] SUITE;  Service: Endoscopy;  Laterality: N/A;  9:00 AM  . DOPPLER ECHOCARDIOGRAPHY    . JOINT REPLACEMENT  07/01/2010   left hip  . KNEE SURGERY Right    arthroscopy  . left hip  replaced  07/01/2010  . SPINE SURGERY  2006   cervical  . stress dipyridamole myocardial perfusion    . THYROIDECTOMY    . TONSILLECTOMY    . VESICOVAGINAL FISTULA CLOSURE W/ TAH     Family History  Problem Relation Age of Onset  . Heart attack Father   . Heart failure Mother   . Asthma Daughter   . Sleep apnea Son        CPAP  . Colon cancer Neg Hx    History  Sexual Activity  . Sexual activity: No    Outpatient Encounter Prescriptions as of 01/03/2017  Medication Sig  . aspirin EC 81 MG tablet Take 1 tablet (81 mg total) by mouth daily.  . Difluprednate (DUREZOL) 0.05 % EMUL Apply 1 drop to eye daily. Instill 1 drop in left eye once daily  . ferrous sulfate 325 (65 FE) MG tablet Take 325 mg by mouth daily with breakfast.  . fluticasone (FLONASE) 50 MCG/ACT nasal spray Place 2 sprays into both nostrils daily. (Patient taking differently: Place 2 sprays into both nostrils daily as needed for allergies. )  . gabapentin (NEURONTIN) 100 MG capsule TAKE 1 CAPSULE(100 MG) BY MOUTH THREE TIMES DAILY (Patient taking differently: Take 100 mg by mouth at bedtime. )  . glucose blood (ACCU-CHEK AVIVA PLUS) test strip Use as instructed four times daily dx E11.65  .  imipramine (TOFRANIL) 25 MG tablet TAKE 4 TABLETS BY MOUTH EVERY NIGHT AT BEDTIME (Patient taking differently: Take 50 mg by mouth at bedtime. )  . Lancets (ACCU-CHEK MULTICLIX) lancets Use as instructed three times daily dx 250.01  . levothyroxine (SYNTHROID) 200 MCG tablet Take 1 tablet (200 mcg total) by mouth daily before breakfast.  . loratadine (CLARITIN) 10 MG tablet Take 10 mg by mouth daily as needed for allergies.   . Menthol, Topical Analgesic, (ICY HOT EX) Apply 1 application topically 3 (three) times daily as needed (for back pain.).  Marland Kitchen metFORMIN (GLUCOPHAGE) 1000 MG tablet Take 1 tablet (1,000 mg total) by mouth 2 (two) times daily.  . metoprolol (LOPRESSOR) 50 MG tablet Take 1 tablet (50 mg total) by mouth 2 (two)  times daily.  . Multiple Vitamin (MULTIVITAMIN) tablet Take 1 tablet by mouth daily.  Marland Kitchen PARoxetine (PAXIL) 20 MG tablet TAKE 1 TABLET BY MOUTH EVERY DAY  . pioglitazone (ACTOS) 30 MG tablet Take 1 tablet by mouth daily.  . pravastatin (PRAVACHOL) 80 MG tablet Take 1 tablet (80 mg total) by mouth daily. (Patient taking differently: Take 80 mg by mouth at bedtime. )  . quinapril (ACCUPRIL) 10 MG tablet Take 1 tablet (10 mg total) by mouth daily.  . verapamil (VERELAN PM) 180 MG 24 hr capsule TAKE ONE CAPSULE BY MOUTH EVERY DAY  . vitamin B-12 (CYANOCOBALAMIN) 1000 MCG tablet Take 1 tablet (1,000 mcg total) by mouth daily.  . Vitamin D, Ergocalciferol, (DRISDOL) 50000 units CAPS capsule TAKE 1 CAPSULE BY MOUTH EVERY 7 DAYS (Patient taking differently: Take 50,000 Units by mouth every Wednesday. )  . Insulin Glargine (TOUJEO SOLOSTAR) 300 UNIT/ML SOPN Inject 70 Units into the skin at bedtime.   . Insulin Pen Needle (B-D ULTRAFINE III SHORT PEN) 31G X 8 MM MISC Use to inject insulin daily (Patient not taking: Reported on 01/03/2017)  . linagliptin (TRADJENTA) 5 MG TABS tablet Take 5 mg by mouth daily.   No facility-administered encounter medications on file as of 01/03/2017.     Activities of Daily Living In your present state of health, do you have any difficulty performing the following activities: 01/03/2017 12/09/2016  Hearing? N Y  Comment - patient stated soemtimes she has problems hearing  Vision? N N  Difficulty concentrating or making decisions? N N  Walking or climbing stairs? N N  Dressing or bathing? N N  Doing errands, shopping? N N  Preparing Food and eating ? N Y  Using the Toilet? N Y  In the past six months, have you accidently leaked urine? N N  Do you have problems with loss of bowel control? N N  Managing your Medications? N Y  Managing your Finances? N Y  Housekeeping or managing your Housekeeping? N Y  Some recent data might be hidden    Patient Care Team: Fayrene Helper, MD as PCP - General Sherlynn Stalls, MD (Ophthalmology) Lorretta Harp, MD as Consulting Physician (Cardiology) Lenard Simmer, MD as Attending Physician (Endocrinology) Gala Romney Cristopher Estimable, MD as Consulting Physician (Gastroenterology) Cassandria Anger, MD as Consulting Physician (Endocrinology) Lazaro Arms, RN as [place] Management    Assessment:    Exercise Activities and Dietary recommendations Current Exercise Habits: The patient does not participate in regular exercise at present, Exercise limited by: None identified  Goals    . Blood Pressure < 140/90    . Exercise 150 minutes per week (moderate activity)    .  HEMOGLOBIN A1C < 7.0    . LDL CALC < 70      Fall Risk Fall Risk  01/03/2017 12/09/2016 12/08/2016 07/14/2016 04/12/2016  Falls in the past year? Yes No No No No  Number falls in past yr: 1 - - - -  Injury with Fall? No - - - -  Comment - - - - -  Risk Factor Category  - - - - -  Risk for fall due to : - - - - -  Risk for fall due to: Comment - - - - -  Follow up Falls evaluation completed;Education provided;Falls prevention discussed - - - -  Comment reveals patient accidentally tripped up the stairs - - - -   Depression Screen PHQ 2/9 Scores 01/03/2017 12/09/2016 12/08/2016 07/14/2016  PHQ - 2 Score 0 0 0 0  PHQ- 9 Score 0 - - -     Cognitive Function: Normal   6CIT Screen 01/03/2017  What Year? 0 points  What month? 0 points  What time? 0 points  Count back from 20 0 points  Months in reverse 0 points  Repeat phrase 0 points  Total Score 0    Immunization History  Administered Date(s) Administered  . Influenza Split 03/10/2012  . Influenza Whole 01/04/2007  . Influenza,inj,Quad PF,6+ Mos 11/28/2012, 12/31/2013  . Influenza-Unspecified 02/09/2016  . Pneumococcal Conjugate-13 07/15/2014  . Pneumococcal Polysaccharide-23 02/17/2004, 09/07/2012  . Tdap 12/21/2010   Screening Tests Health Maintenance  Topic  Date Due  . HEMOGLOBIN A1C  04/21/2016  . FOOT EXAM  10/16/2016  . INFLUENZA VACCINE  10/20/2016  . OPHTHALMOLOGY EXAM  12/25/2016  . MAMMOGRAM  11/12/2018  . TETANUS/TDAP  12/20/2020  . COLONOSCOPY  01/16/2024  . DEXA SCAN  Completed  . Hepatitis C Screening  Completed  . PNA vac Low Risk Adult  Completed      Plan:   I have personally reviewed and noted the following in the patient's chart:   . Medical and social history . Use of alcohol, tobacco or illicit drugs  . Current medications and supplements . Functional ability and status . Nutritional status . Physical activity . Advanced directives . List of other physicians . Hospitalizations, surgeries, and ER visits in previous 12 months . Vitals . Screenings to include cognitive, depression, and falls . Referrals and appointments  In addition, I have reviewed and discussed with patient certain preventive protocols, quality metrics, and best practice recommendations. A written personalized care plan for preventive services as well as general preventive health recommendations were provided to patient.     Naimah Yingst, LPN  [id number]

## 2017-01-03 NOTE — Patient Instructions (Addendum)
Jennifer Blackburn , Thank you for taking time to come for your Medicare Wellness Visit. I appreciate your ongoing commitment to your health goals. Please review the following plan we discussed and let me know if I can assist you in the future.   Screening recommendations/referrals: Colonoscopy: Up to date, next due 12/2018 Mammogram: Up to date, next due 10/2017 Bone Density: Up to date Diabetic Eye Exam: Up to date, will request records from Dr. Jorja Loa Recommended yearly dental visit for hygiene and checkup  Vaccinations: Influenza vaccine: Due, please have this done as soon as possible Pneumococcal vaccine: Up to date Tdap vaccine: Up to date, next due 12/2020 Shingles vaccine: Done    Advanced directives: Advance directive discussed with you today. I have provided a copy for you to complete at home and have notarized. Once this is complete please bring a copy in to our office so we can scan it into your chart.  Conditions/risks identified: Obese, recommend starting a routine exercise program at least 3 days a week for 30-45 minutes at a time as tolerated.   Next appointment: Follow up with Dr. Moshe Cipro as soon as possible. Follow up in 1 year for your annual wellness visit.  Preventive Care 50 Years and Older, Female Preventive care refers to lifestyle choices and visits with your health care provider that can promote health and wellness. What does preventive care include?  A yearly physical exam. This is also called an annual well check.  Dental exams once or twice a year.  Routine eye exams. Ask your health care provider how often you should have your eyes checked.  Personal lifestyle choices, including:  Daily care of your teeth and gums.  Regular physical activity.  Eating a healthy diet.  Avoiding tobacco and drug use.  Limiting alcohol use.  Practicing safe sex.  Taking low-dose aspirin every day.  Taking vitamin and mineral supplements as recommended by your health  care provider. What happens during an annual well check? The services and screenings done by your health care provider during your annual well check will depend on your age, overall health, lifestyle risk factors, and family history of disease. Counseling  Your health care provider may ask you questions about your:  Alcohol use.  Tobacco use.  Drug use.  Emotional well-being.  Home and relationship well-being.  Sexual activity.  Eating habits.  History of falls.  Memory and ability to understand (cognition).  Work and work Statistician.  Reproductive health. Screening  You may have the following tests or measurements:  Height, weight, and BMI.  Blood pressure.  Lipid and cholesterol levels. These may be checked every 5 years, or more frequently if you are over 38 years old.  Skin check.  Lung cancer screening. You may have this screening every year starting at age 67 if you have a 30-pack-year history of smoking and currently smoke or have quit within the past 15 years.  Fecal occult blood test (FOBT) of the stool. You may have this test every year starting at age 31.  Flexible sigmoidoscopy or colonoscopy. You may have a sigmoidoscopy every 5 years or a colonoscopy every 10 years starting at age 16.  Hepatitis C blood test.  Hepatitis B blood test.  Sexually transmitted disease (STD) testing.  Diabetes screening. This is done by checking your blood sugar (glucose) after you have not eaten for a while (fasting). You may have this done every 1-3 years.  Bone density scan. This is done to screen for osteoporosis.  You may have this done starting at age 41.  Mammogram. This may be done every 1-2 years. Talk to your health care provider about how often you should have regular mammograms. Talk with your health care provider about your test results, treatment options, and if necessary, the need for more tests. Vaccines  Your health care provider may recommend certain  vaccines, such as:  Influenza vaccine. This is recommended every year.  Tetanus, diphtheria, and acellular pertussis (Tdap, Td) vaccine. You may need a Td booster every 10 years.  Zoster vaccine. You may need this after age 90.  Pneumococcal 13-valent conjugate (PCV13) vaccine. One dose is recommended after age 62.  Pneumococcal polysaccharide (PPSV23) vaccine. One dose is recommended after age 62. Talk to your health care provider about which screenings and vaccines you need and how often you need them. This information is not intended to replace advice given to you by your health care provider. Make sure you discuss any questions you have with your health care provider. Document Released: 04/04/2015 Document Revised: 11/26/2015 Document Reviewed: 01/07/2015 Elsevier Interactive Patient Education  2017 [employer] Prevention in the Home Falls can cause injuries. They can happen to people of all ages. There are many things you can do to make your home safe and to help prevent falls. What can I do on the outside of my home?  Regularly fix the edges of walkways and driveways and fix any cracks.  Remove anything that might make you trip as you walk through a door, such as a raised step or threshold.  Trim any bushes or trees on the path to your home.  Use bright outdoor lighting.  Clear any walking paths of anything that might make someone trip, such as rocks or tools.  Regularly check to see if handrails are loose or broken. Make sure that both sides of any steps have handrails.  Any raised decks and porches should have guardrails on the edges.  Have any leaves, snow, or ice cleared regularly.  Use sand or salt on walking paths during winter.  Clean up any spills in your garage right away. This includes oil or grease spills. What can I do in the bathroom?  Use night lights.  Install grab bars by the toilet and in the tub and shower. Do not use towel bars as grab  bars.  Use non-skid mats or decals in the tub or shower.  If you need to sit down in the shower, use a plastic, non-slip stool.  Keep the floor dry. Clean up any water that spills on the floor as soon as it happens.  Remove soap buildup in the tub or shower regularly.  Attach bath mats securely with double-sided non-slip rug tape.  Do not have throw rugs and other things on the floor that can make you trip. What can I do in the bedroom?  Use night lights.  Make sure that you have a light by your bed that is easy to reach.  Do not use any sheets or blankets that are too big for your bed. They should not hang down onto the floor.  Have a firm chair that has side arms. You can use this for support while you get dressed.  Do not have throw rugs and other things on the floor that can make you trip. What can I do in the kitchen?  Clean up any spills right away.  Avoid walking on wet floors.  Keep items that you use a  lot in easy-to-reach places.  If you need to reach something above you, use a strong step stool that has a grab bar.  Keep electrical cords out of the way.  Do not use floor polish or wax that makes floors slippery. If you must use wax, use non-skid floor wax.  Do not have throw rugs and other things on the floor that can make you trip. What can I do with my stairs?  Do not leave any items on the stairs.  Make sure that there are handrails on both sides of the stairs and use them. Fix handrails that are broken or loose. Make sure that handrails are as long as the stairways.  Check any carpeting to make sure that it is firmly attached to the stairs. Fix any carpet that is loose or worn.  Avoid having throw rugs at the top or bottom of the stairs. If you do have throw rugs, attach them to the floor with carpet tape.  Make sure that you have a light switch at the top of the stairs and the bottom of the stairs. If you do not have them, ask someone to add them for  you. What else can I do to help prevent falls?  Wear shoes that:  Do not have high heels.  Have rubber bottoms.  Are comfortable and fit you well.  Are closed at the toe. Do not wear sandals.  If you use a stepladder:  Make sure that it is fully opened. Do not climb a closed stepladder.  Make sure that both sides of the stepladder are locked into place.  Ask someone to hold it for you, if possible.  Clearly mark and make sure that you can see:  Any grab bars or handrails.  First and last steps.  Where the edge of each step is.  Use tools that help you move around (mobility aids) if they are needed. These include:  Canes.  Walkers.  Scooters.  Crutches.  Turn on the lights when you go into a dark area. Replace any light bulbs as soon as they burn out.  Set up your furniture so you have a clear path. Avoid moving your furniture around.  If any of your floors are uneven, fix them.  If there are any pets around you, be aware of where they are.  Review your medicines with your doctor. Some medicines can make you feel dizzy. This can increase your chance of falling. Ask your doctor what other things that you can do to help prevent falls. This information is not intended to replace advice given to you by your health care provider. Make sure you discuss any questions you have with your health care provider. Document Released: 01/02/2009 Document Revised: 08/14/2015 Document Reviewed: 04/12/2014 Elsevier Interactive Patient Education  2017 [employer].

## 2017-01-05 ENCOUNTER — Other Ambulatory Visit: Payer: Self-pay

## 2017-01-05 ENCOUNTER — Telehealth: Payer: Self-pay | Admitting: *Deleted

## 2017-01-05 NOTE — Patient Outreach (Addendum)
[facility]) Care Management  [place]  01/05/2017   Jennifer Blackburn Aug 28, 1945 [id number]  Subjective: RN health Coach spoke with the patient and HIPAA verified.  The patient voiced that she is doing well this morning.  She had not checked her blood sugar this morning  but her fasting morning checks have been running 110-120.  She stated that she has checked  Her blood sugar post prandial this week and her it ran between 230-270.  She states that she has gained about 10 lbs over the last 2-3 months. The patient states that she still does not have her Toujeo and Tradjenta.  The patient has spoken with our pharmacist  Jaclyn Shaggy and she is not eligible for help from the companies which is noted in her chart. The patient states that she called her endocrinologist office for samples but they were unable to provide them at this time.  She also states that she is concerned about her blood sugars and how they will affect her since she is not taking her insulin.  She states that she is on metformin and Actos.  The Actos is causing her feet to swell.  She states that she informed her endocrinologist office and they notified that she was able to stop the Actos.  [place] discussed  Diabetic and low salt diet with the patient. [place] also talked with the patient about keeping a record of her blood sugars and to check at different times of the day so that  she can give her physician a picture of how her blood sugars are running.  The patient was advised to elevate her legs when sitting. RN Health Coach also advise the patient to call her PCP office to express her concerns. Patient verbalized understanding.  Objective:   Encounter Medications:  Outpatient Encounter Prescriptions as of 01/05/2017  Medication Sig Note  . aspirin EC 81 MG tablet Take 1 tablet (81 mg total) by mouth daily.   . Difluprednate (DUREZOL) 0.05 % EMUL Apply 1 drop to eye daily. Instill 1 drop in  left eye once daily   . ferrous sulfate 325 (65 FE) MG tablet Take 325 mg by mouth daily with breakfast.   . fluticasone (FLONASE) 50 MCG/ACT nasal spray Place 2 sprays into both nostrils daily. (Patient taking differently: Place 2 sprays into both nostrils daily as needed for allergies. )   . gabapentin (NEURONTIN) 100 MG capsule TAKE 1 CAPSULE(100 MG) BY MOUTH THREE TIMES DAILY (Patient taking differently: Take 100 mg by mouth at bedtime. )   . glucose blood (ACCU-CHEK AVIVA PLUS) test strip Use as instructed four times daily dx E11.65   . imipramine (TOFRANIL) 25 MG tablet TAKE 4 TABLETS BY MOUTH EVERY NIGHT AT BEDTIME (Patient taking differently: Take 50 mg by mouth at bedtime. )   . Insulin Pen Needle (B-D ULTRAFINE III SHORT PEN) 31G X 8 MM MISC Use to inject insulin daily   . Lancets (ACCU-CHEK MULTICLIX) lancets Use as instructed three times daily dx 250.01   . levothyroxine (SYNTHROID) 200 MCG tablet Take 1 tablet (200 mcg total) by mouth daily before breakfast.   . loratadine (CLARITIN) 10 MG tablet Take 10 mg by mouth daily as needed for allergies.    . Menthol, Topical Analgesic, (ICY HOT EX) Apply 1 application topically 3 (three) times daily as needed (for back pain.).   Marland Kitchen metFORMIN (GLUCOPHAGE) 1000 MG tablet Take 1 tablet (1,000 mg total) by mouth 2 (  two) times daily.   . metoprolol (LOPRESSOR) 50 MG tablet Take 1 tablet (50 mg total) by mouth 2 (two) times daily.   . Multiple Vitamin (MULTIVITAMIN) tablet Take 1 tablet by mouth daily.   Marland Kitchen PARoxetine (PAXIL) 20 MG tablet TAKE 1 TABLET BY MOUTH EVERY DAY   . pioglitazone (ACTOS) 30 MG tablet Take 1 tablet by mouth daily.   . pravastatin (PRAVACHOL) 80 MG tablet Take 1 tablet (80 mg total) by mouth daily. (Patient taking differently: Take 80 mg by mouth at bedtime. )   . quinapril (ACCUPRIL) 10 MG tablet Take 1 tablet (10 mg total) by mouth daily.   . verapamil (VERELAN PM) 180 MG 24 hr capsule TAKE ONE CAPSULE BY MOUTH EVERY DAY   .  vitamin B-12 (CYANOCOBALAMIN) 1000 MCG tablet Take 1 tablet (1,000 mcg total) by mouth daily.   . Vitamin D, Ergocalciferol, (DRISDOL) 50000 units CAPS capsule TAKE 1 CAPSULE BY MOUTH EVERY 7 DAYS (Patient taking differently: Take 50,000 Units by mouth every Wednesday. )   . Insulin Glargine (TOUJEO SOLOSTAR) 300 UNIT/ML SOPN Inject 70 Units into the skin at bedtime.  01/05/2017: The patient does not have the medication at this time.   Marland Kitchen linagliptin (TRADJENTA) 5 MG TABS tablet Take 5 mg by mouth daily. 01/05/2017: The patient does not have the medication at this time.   No facility-administered encounter medications on file as of 01/05/2017.     Functional Status:  In your present state of health, do you have any difficulty performing the following activities: 01/03/2017 12/09/2016  Hearing? N Y  Comment - patient stated soemtimes she has problems hearing  Vision? N N  Difficulty concentrating or making decisions? N N  Walking or climbing stairs? N N  Dressing or bathing? N N  Doing errands, shopping? N N  Preparing Food and eating ? N Y  Using the Toilet? N Y  In the past six months, have you accidently leaked urine? N N  Do you have problems with loss of bowel control? N N  Managing your Medications? N Y  Managing your Finances? N Y  Housekeeping or managing your Housekeeping? N Y  Some recent data might be hidden    Fall/Depression Screening: Fall Risk  01/05/2017 01/03/2017 12/09/2016  Falls in the past year? No Yes No  Number falls in past yr: - 1 -  Injury with Fall? - No -  Comment - - -  Risk Factor Category  - - -  Risk for fall due to : - - -  Risk for fall due to: Comment - - -  Follow up - Falls evaluation completed;Education provided;Falls prevention discussed -  Comment - reveals patient accidentally tripped up the stairs -   PHQ 2/9 Scores 01/05/2017 01/03/2017 12/09/2016 12/08/2016 07/14/2016 03/23/2016 02/11/2015  PHQ - 2 Score 0 0 0 0 0 0 0  PHQ- 9 Score - 0 - - -  - -    Assessment: Patient continues to benefit from health coach outreach for disease management and support.   THN CM Care Plan Problem One     Most Recent Value  Care Plan Problem One  knowledge deficit related to diabetes  Role Documenting the Problem One  Health Coach  Care Plan for Problem One  Active  [facility] Long Term Goal   Patient will work on lowering her a1c by 0,2 point in 90 days  [place] Term Goal Start Date  01/05/17  Interventions for Problem One  [place] sent educational information for reduction of a1c. [place] talked information sent   [facility] CM Short Term Goal #1   patient will check her blood sugars daily for the next 30 days  TH[facility] CM Short Term Goal #1 Start Date  01/05/17  Interventions for Short Term Goal #1  RN Health Coach discussed the importance of cheking blood sugars daily  THN CM Short Term Goal #2   Patient will work on her diet to lose two pounds in 30 days   [facility]N CM Short Term Goal #2 Start Date  01/05/17  Interventions for Short Term Goal #2  [place] sent the patient educational information on diet. Also talked about information sent       Plan: RN Health Coach will send note to PCP. RN Health Coach will contact patient in the month of November and patient agrees to next outreach.  Lazaro Arms RN, BSN, [place] Direct Dial:  [phone] Fax: [phone]

## 2017-01-05 NOTE — Telephone Encounter (Signed)
Patient called stating that she has been denied by [facility]. And wanted to know if she could go without taking her insulin until the new year? She is currently on Metformin & Actos.

## 2017-01-06 NOTE — Telephone Encounter (Signed)
I recommend she discuss getting a more affordable type of insulin from dr Carmela Rima, who is treating her diabetes there is lower cost affordable insulin, he needs to know what is going on with her ,,her blood sugar is too uncontrolled to go without

## 2017-01-07 NOTE — Telephone Encounter (Signed)
Patient informed of message below, verbalized understanding.  

## 2017-01-12 DIAGNOSIS — E785 Hyperlipidemia, unspecified: Secondary | ICD-10-CM | POA: Diagnosis not present

## 2017-01-12 DIAGNOSIS — E039 Hypothyroidism, unspecified: Secondary | ICD-10-CM | POA: Diagnosis not present

## 2017-01-12 DIAGNOSIS — C73 Malignant neoplasm of thyroid gland: Secondary | ICD-10-CM | POA: Diagnosis not present

## 2017-01-12 DIAGNOSIS — E1165 Type 2 diabetes mellitus with hyperglycemia: Secondary | ICD-10-CM | POA: Diagnosis not present

## 2017-01-12 DIAGNOSIS — I10 Essential (primary) hypertension: Secondary | ICD-10-CM | POA: Diagnosis not present

## 2017-01-12 DIAGNOSIS — E669 Obesity, unspecified: Secondary | ICD-10-CM | POA: Diagnosis not present

## 2017-01-12 DIAGNOSIS — E1159 Type 2 diabetes mellitus with other circulatory complications: Secondary | ICD-10-CM | POA: Diagnosis not present

## 2017-01-24 DIAGNOSIS — D539 Nutritional anemia, unspecified: Secondary | ICD-10-CM | POA: Diagnosis not present

## 2017-01-24 LAB — FERRITIN: Ferritin: 99 ng/mL (ref 20–288)

## 2017-01-24 LAB — CBC
HCT: 32.7 % — ABNORMAL LOW (ref 35.0–45.0)
Hemoglobin: 10.5 g/dL — ABNORMAL LOW (ref 11.7–15.5)
MCH: 26.1 pg — ABNORMAL LOW (ref 27.0–33.0)
MCHC: 32.1 g/dL (ref 32.0–36.0)
MCV: 81.3 fL (ref 80.0–100.0)
MPV: 10.3 fL (ref 7.5–12.5)
Platelets: 260 10*3/uL (ref 140–400)
RBC: 4.02 10*6/uL (ref 3.80–5.10)
RDW: 13.4 % (ref 11.0–15.0)
WBC: 5.4 10*3/uL (ref 3.8–10.8)

## 2017-01-24 LAB — IRON, TOTAL/TOTAL IRON BINDING CAP
%SAT: 17 % (calc) (ref 11–50)
Iron: 58 ug/dL (ref 45–160)
TIBC: 349 mcg/dL (calc) (ref 250–450)

## 2017-01-24 LAB — VITAMIN B12: Vitamin B-12: 463 pg/mL (ref 200–1100)

## 2017-01-24 LAB — FOLATE: Folate: 19.2 ng/mL

## 2017-01-31 ENCOUNTER — Encounter: Payer: Self-pay | Admitting: Family Medicine

## 2017-01-31 ENCOUNTER — Ambulatory Visit (INDEPENDENT_AMBULATORY_CARE_PROVIDER_SITE_OTHER): Payer: Medicare Other | Admitting: Family Medicine

## 2017-01-31 VITALS — BP 130/70 | HR 71 | Resp 16 | Ht 65.0 in | Wt 201.0 lb

## 2017-01-31 DIAGNOSIS — E559 Vitamin D deficiency, unspecified: Secondary | ICD-10-CM

## 2017-01-31 DIAGNOSIS — E66811 Obesity, class 1: Secondary | ICD-10-CM

## 2017-01-31 DIAGNOSIS — F419 Anxiety disorder, unspecified: Secondary | ICD-10-CM

## 2017-01-31 DIAGNOSIS — I10 Essential (primary) hypertension: Secondary | ICD-10-CM | POA: Diagnosis not present

## 2017-01-31 DIAGNOSIS — E669 Obesity, unspecified: Secondary | ICD-10-CM | POA: Diagnosis not present

## 2017-01-31 DIAGNOSIS — F329 Major depressive disorder, single episode, unspecified: Secondary | ICD-10-CM

## 2017-01-31 DIAGNOSIS — Z23 Encounter for immunization: Secondary | ICD-10-CM | POA: Diagnosis not present

## 2017-01-31 DIAGNOSIS — E7849 Other hyperlipidemia: Secondary | ICD-10-CM | POA: Diagnosis not present

## 2017-01-31 DIAGNOSIS — E890 Postprocedural hypothyroidism: Secondary | ICD-10-CM | POA: Diagnosis not present

## 2017-01-31 DIAGNOSIS — E1159 Type 2 diabetes mellitus with other circulatory complications: Secondary | ICD-10-CM

## 2017-01-31 DIAGNOSIS — F32A Depression, unspecified: Secondary | ICD-10-CM

## 2017-01-31 NOTE — Patient Instructions (Signed)
F/u end March, call if you need me before  Fl;u vaccine today  Blood pressure is good  Congrats on much better blood sugar  It is important that you exercise regularly at least 30 minutes 5 times a week. If you develop chest pain, have severe difficulty breathing, or feel very tired, stop exercising immediately and seek medical attention    Fasting lipid, cmp ad EGFR, and vit D  In 5 months, 1 week before visit  Thank you  for choosing [facility]. We consider it a privelige to serve you.  Delivering excellent health care in a caring and  compassionate way is our goal.  Partnering with you,  so that together we can achieve this goal is our strategy.

## 2017-02-06 ENCOUNTER — Encounter: Payer: Self-pay | Admitting: Family Medicine

## 2017-02-06 NOTE — Assessment & Plan Note (Signed)
Controlled, no change in medication DASH diet and commitment to daily physical activity for a minimum of 30 minutes discussed and encouraged, as a part of hypertension management. The importance of attaining a healthy weight is also discussed.  BP/Weight 01/31/2017 01/03/2017 12/09/2016 09/17/2016 07/14/2016 05/14/2016 0/92/3300  Systolic BP 762 263 - 335 456 256 389  Diastolic BP 70 66 - 57 72 76 62  Wt. (Lbs) 201 202.12 193 191 195 197.4 197  BMI 33.45 33.63 32.12 31.78 33.47 33.88 32.78

## 2017-02-06 NOTE — Assessment & Plan Note (Addendum)
Unchanged Patient re-educated about  the importance of commitment to a  minimum of 150 minutes of exercise per week.  The importance of healthy food choices with portion control discussed. Encouraged to start a food diary, count calories and to consider  joining a support group. Sample diet sheets offered. Goals set by the patient for the next several months.   Weight /BMI 01/31/2017 01/03/2017 12/09/2016  WEIGHT 201 lb 202 lb 1.9 oz 193 lb  HEIGHT 5\' 5"  5\' 5"  5\' 5"   BMI 33.45 kg/m2 33.63 kg/m2 32.12 kg/m2

## 2017-02-06 NOTE — Assessment & Plan Note (Signed)
Maintained on  prophylactic imipramine successfully, remains symptom free a lot of the time

## 2017-02-06 NOTE — Assessment & Plan Note (Signed)
Hyperlipidemia:Low fat diet discussed and encouraged.   Lipid Panel  Lab Results  Component Value Date   CHOL 172 07/13/2016   HDL 42 (L) 07/13/2016   LDLCALC 100 (H) 07/13/2016   TRIG 148 07/13/2016   CHOLHDL 4.1 07/13/2016  not at goal Updated lab needed at/ before next visit.

## 2017-02-06 NOTE — Assessment & Plan Note (Signed)
Maintained on weekly vit d Updated lab needed at/ before next visit.

## 2017-02-06 NOTE — Assessment & Plan Note (Signed)
uncontroled but reports marked improvement with lifestyle change and on current medication regime which incidentally does not include insulin. Followed by [name] is reminded of the importance of commitment to daily physical activity for 30 minutes or more, as able and the need to limit carbohydrate intake to 30 to 60 grams per meal to help with blood sugar control.   The need to take medication as prescribed, test blood sugar as directed, and to call between visits if there is a concern that blood sugar is uncontrolled is also discussed.   Jennifer Blackburn is reminded of the importance of daily foot exam, annual eye examination, and good blood sugar, blood pressure and cholesterol control.  Diabetic Labs Latest Ref Rng & Units 07/13/2016 03/23/2016 10/20/2015 06/06/2015 11/14/2014  HbA1c - - - 8.0 - 9.0(H)  Microalbumin <2.0 mg/dL - - - - -  Micro/Creat Ratio 0.0 - 30.0 mg/g - - - - -  Chol <200 mg/dL 172 - - - 175  HDL >50 mg/dL 42(L) - - - 42(L)  Calc LDL <100 mg/dL 100(H) - 132 97 112  Triglycerides <150 mg/dL 148 - - - 106  Creatinine 0.60 - 0.93 mg/dL 0.69 0.78 - - 0.73   BP/Weight 01/31/2017 01/03/2017 12/09/2016 09/17/2016 07/14/2016 05/14/2016 5/00/3704  Systolic BP 888 916 - 945 038 882 800  Diastolic BP 70 66 - 57 72 76 62  Wt. (Lbs) 201 202.12 193 191 195 197.4 197  BMI 33.45 33.63 32.12 31.78 33.47 33.88 32.78   Foot/eye exam completion dates Latest Ref Rng & Units 12/26/2015 10/15/2015  Eye Exam No Retinopathy Retinopathy(A) -  Foot exam Order - - -  Foot Form Completion - - Done      '

## 2017-02-06 NOTE — Progress Notes (Signed)
Jennifer Blackburn     MRN: [id number]      DOB: April 06, 1945   HPI Ms. Groll is here for follow up and re-evaluation of chronic medical conditions, medication management and review of any available recent lab and radiology data.  Preventive health is updated, specifically  Cancer screening and Immunization.   Questions or concerns regarding consultations or procedures which the PT has had in the interim are  addressed. The PT denies any adverse reactions to current medications since the last visit.  Has been challenged affording her insulin, and now reports good control on oral agents only, she continues to be followed by endocrinology. Denies polyuria, polydipsia, blurred vision , or hypoglycemic episodes. There are no specific complaints   ROS Denies recent fever or chills. Denies sinus pressure, nasal congestion, ear pain or sore throat. Denies chest congestion, productive cough or wheezing. Denies chest pains, palpitations and leg swelling Denies abdominal pain, nausea, vomiting,diarrhea or constipation.   Denies dysuria, frequency, hesitancy or incontinence. Denies uncontrolled  joint pain, swelling and limitation in mobility. Denies headaches, seizures, numbness, or tingling. Denies uncontrolled depression, anxiety or insomnia. Denies skin break down or rash.   PE  BP 130/70   Pulse 71   Resp 16   Ht 5\' 5"  (1.651 m)   Wt 201 lb (91.2 kg)   LMP 11/11/2016   SpO2 93%   BMI 33.45 kg/m   Patient alert and oriented and in no cardiopulmonary distress.  HEENT: No facial asymmetry, EOMI,   oropharynx pink and moist.  Neck supple no JVD, no mass.  Chest: Clear to auscultation bilaterally.  CVS: S1, S2 no murmurs, no S3.Regular rate.  ABD: Soft non tender.   Ext: No edema  MS: Adequate ROM spine, shoulders, hips and knees.  Skin: Intact, no ulcerations or rash noted.  Psych: Good eye contact, normal affect. Memory intact not anxious or depressed appearing.  CNS: CN  2-12 intact, power,  normal throughout.no focal deficits noted.   Assessment & Plan  ESSENTIAL HYPERTENSION, BENIGN Controlled, no change in medication DASH diet and commitment to daily physical activity for a minimum of 30 minutes discussed and encouraged, as a part of hypertension management. The importance of attaining a healthy weight is also discussed.  BP/Weight 01/31/2017 01/03/2017 12/09/2016 09/17/2016 07/14/2016 05/14/2016 7/82/9562  Systolic BP 130 865 - 784 696 295 284  Diastolic BP 70 66 - 57 72 76 62  Wt. (Lbs) 201 202.12 193 191 195 197.4 197  BMI 33.45 33.63 32.12 31.78 33.47 33.88 32.78       Hyperlipidemia Hyperlipidemia:Low fat diet discussed and encouraged.   Lipid Panel  Lab Results  Component Value Date   CHOL 172 07/13/2016   HDL 42 (L) 07/13/2016   LDLCALC 100 (H) 07/13/2016   TRIG 148 07/13/2016   CHOLHDL 4.1 07/13/2016  not at goal Updated lab needed at/ before next visit.      Type 2 diabetes mellitus with vascular disease ([place]) uncontroled but reports marked improvement with lifestyle change and on current medication regime which incidentally does not include insulin. Followed by endo Ms. Degeorge is reminded of the importance of commitment to daily physical activity for 30 minutes or more, as able and the need to limit carbohydrate intake to 30 to 60 grams per meal to help with blood sugar control.   The need to take medication as prescribed, test blood sugar as directed, and to call between visits if there is a concern that blood sugar  is uncontrolled is also discussed.   Ms. Pavia is reminded of the importance of daily foot exam, annual eye examination, and good blood sugar, blood pressure and cholesterol control.  Diabetic Labs Latest Ref Rng & Units 07/13/2016 03/23/2016 10/20/2015 06/06/2015 11/14/2014  HbA1c - - - 8.0 - 9.0(H)  Microalbumin <2.0 mg/dL - - - - -  Micro/Creat Ratio 0.0 - 30.0 mg/g - - - - -  Chol <200 mg/dL 172 - - - 175  HDL  >50 mg/dL 42(L) - - - 42(L)  Calc LDL <100 mg/dL 100(H) - 132 97 112  Triglycerides <150 mg/dL 148 - - - 106  Creatinine 0.60 - 0.93 mg/dL 0.69 0.78 - - 0.73   BP/Weight 01/31/2017 01/03/2017 12/09/2016 09/17/2016 07/14/2016 05/14/2016 3/57/0177  Systolic BP 939 030 - 092 330 076 226  Diastolic BP 70 66 - 57 72 76 62  Wt. (Lbs) 201 202.12 193 191 195 197.4 197  BMI 33.45 33.63 32.12 31.78 33.47 33.88 32.78   Foot/eye exam completion dates Latest Ref Rng & Units 12/26/2015 10/15/2015  Eye Exam No Retinopathy Retinopathy(A) -  Foot exam Order - - -  Foot Form Completion - - Done      '   Obesity (BMI 30.0-34.9) Unchanged Patient re-educated about  the importance of commitment to a  minimum of 150 minutes of exercise per week.  The importance of healthy food choices with portion control discussed. Encouraged to start a food diary, count calories and to consider  joining a support group. Sample diet sheets offered. Goals set by the patient for the next several months.   Weight /BMI 01/31/2017 01/03/2017 12/09/2016  WEIGHT 201 lb 202 lb 1.9 oz 193 lb  HEIGHT 5\' 5"  5\' 5"  5\' 5"   BMI 33.45 kg/m2 33.63 kg/m2 32.12 kg/m2      Postsurgical hypothyroidism Managed by endo and controlled  Vitamin d deficiency Maintained on weekly vit d Updated lab needed at/ before next visit.   Headache Maintained on  prophylactic imipramine successfully, remains symptom free a lot of the time  Anxiety and depression Improved and adequately controlled on medication, not currently in therapy and no apparent need at this time

## 2017-02-06 NOTE — Assessment & Plan Note (Signed)
Managed by endo and controlled 

## 2017-02-06 NOTE — Assessment & Plan Note (Signed)
Improved and adequately controlled on medication, not currently in therapy and no apparent need at this time

## 2017-02-07 ENCOUNTER — Other Ambulatory Visit: Payer: Self-pay

## 2017-02-07 NOTE — Patient Outreach (Signed)
[place]) Care Management  02/07/2017  Jennifer Blackburn Jan 24, 1946 [id number]   Telephone call to patient for monthly outreach. HIPAA verified with patient The patient was on another call and unable to talk.  She asked if I could call her back at a later time.  Plan: RN Health Coach will make outreach attempt within three business days.  Lazaro Arms RN, BSN, [place] Direct Dial:  [phone] Fax: [phone]

## 2017-02-08 ENCOUNTER — Other Ambulatory Visit: Payer: Self-pay

## 2017-02-08 NOTE — Patient Outreach (Signed)
[name]) Care Management  [place]  02/08/2017   Jennifer Blackburn 1945-06-10 [id number]  Subjective: Telephone call to patient for monthly outreach. HIPAA verified. The patient states that she is doing well. She denies andy pain or falls.  The patient states that she had a visit with her physician three weeks ago and her a1c was 7.1.  She states that she is adherent with her medications.  She is exercising more. She has been able to lose about two pounds since her doctors visit. She is becoming more cognizant about the foods that she is eating. RN Health Coach discussed with her about carbohydrates in her diet. Patient verbalized understanding.   Objective:   Encounter Medications:  Outpatient Encounter Medications as of 02/08/2017  Medication Sig  . aspirin EC 81 MG tablet Take 1 tablet (81 mg total) by mouth daily.  . Difluprednate (DUREZOL) 0.05 % EMUL Apply 1 drop to eye daily. Instill 1 drop in left eye once daily  . ferrous sulfate 325 (65 FE) MG tablet Take 325 mg by mouth daily with breakfast.  . fluticasone (FLONASE) 50 MCG/ACT nasal spray Place 2 sprays into both nostrils daily. (Patient taking differently: Place 2 sprays into both nostrils daily as needed for allergies. )  . gabapentin (NEURONTIN) 100 MG capsule TAKE 1 CAPSULE(100 MG) BY MOUTH THREE TIMES DAILY (Patient taking differently: Take 100 mg by mouth at bedtime. )  . glipiZIDE (GLUCOTROL XL) 5 MG 24 hr tablet Take 1 tablet (5 mg total) daily with breakfast by mouth.  Marland Kitchen glucose blood (ACCU-CHEK AVIVA PLUS) test strip Use as instructed four times daily dx E11.65  . imipramine (TOFRANIL) 25 MG tablet TAKE 4 TABLETS BY MOUTH EVERY NIGHT AT BEDTIME (Patient taking differently: Take 50 mg by mouth at bedtime. )  . Lancets (ACCU-CHEK MULTICLIX) lancets Use as instructed three times daily dx 250.01  . levothyroxine (SYNTHROID) 200 MCG tablet Take 1 tablet (200 mcg total) by mouth daily before  breakfast.  . loratadine (CLARITIN) 10 MG tablet Take 10 mg by mouth daily as needed for allergies.   . Menthol, Topical Analgesic, (ICY HOT EX) Apply 1 application topically 3 (three) times daily as needed (for back pain.).  Marland Kitchen metFORMIN (GLUCOPHAGE) 1000 MG tablet Take 1 tablet (1,000 mg total) by mouth 2 (two) times daily.  . metoprolol (LOPRESSOR) 50 MG tablet Take 1 tablet (50 mg total) by mouth 2 (two) times daily.  . Multiple Vitamin (MULTIVITAMIN) tablet Take 1 tablet by mouth daily.  Marland Kitchen PARoxetine (PAXIL) 20 MG tablet TAKE 1 TABLET BY MOUTH EVERY DAY  . pioglitazone (ACTOS) 30 MG tablet Take 1 tablet by mouth daily.  . pravastatin (PRAVACHOL) 80 MG tablet Take 1 tablet (80 mg total) by mouth daily. (Patient taking differently: Take 80 mg by mouth at bedtime. )  . quinapril (ACCUPRIL) 10 MG tablet Take 1 tablet (10 mg total) by mouth daily.  . verapamil (VERELAN PM) 180 MG 24 hr capsule TAKE ONE CAPSULE BY MOUTH EVERY DAY  . vitamin B-12 (CYANOCOBALAMIN) 1000 MCG tablet Take 1 tablet (1,000 mcg total) by mouth daily.  . Vitamin D, Ergocalciferol, (DRISDOL) 50000 units CAPS capsule TAKE 1 CAPSULE BY MOUTH EVERY 7 DAYS (Patient taking differently: Take 50,000 Units by mouth every Wednesday. )   No facility-administered encounter medications on file as of 02/08/2017.     Functional Status:  In your present state of health, do you have any difficulty performing the following activities: 01/03/2017  12/09/2016  Hearing? N Y  Comment - patient stated soemtimes she has problems hearing  Vision? N N  Difficulty concentrating or making decisions? N N  Walking or climbing stairs? N N  Dressing or bathing? N N  Doing errands, shopping? N N  Preparing Food and eating ? N Y  Using the Toilet? N Y  In the past six months, have you accidently leaked urine? N N  Do you have problems with loss of bowel control? N N  Managing your Medications? N Y  Managing your Finances? N Y  Housekeeping or  managing your Housekeeping? N Y  Some recent data might be hidden    Fall/Depression Screening: Fall Risk  02/08/2017 01/05/2017 01/03/2017  Falls in the past year? No No Yes  Number falls in past yr: - - 1  Injury with Fall? - - No  Comment - - -  Risk Factor Category  - - -  Risk for fall due to : - - -  Risk for fall due to: Comment - - -  Follow up - - Falls evaluation completed;Education provided;Falls prevention discussed  Comment - - reveals patient accidentally tripped up the stairs   PHQ 2/9 Scores 01/05/2017 01/03/2017 12/09/2016 12/08/2016 07/14/2016 03/23/2016 02/11/2015  PHQ - 2 Score 0 0 0 0 0 0 0  PHQ- 9 Score - 0 - - - - -    Assessment: Patient continues to benefit from health coach outreach for disease management and support.   THN CM Care Plan Problem One     Most Recent Value  Care Plan Problem One  knowledge deficit related to diabetes  Role Documenting the Problem One  Health Coach  Care Plan for Problem One  Active  THN Long Term Goal   Patient will work on lowering her a1c by 0,2 point in 90 days  [place] Term Goal Start Date  01/05/17  Interventions for Problem One Long Term Goal  RN Health Coach reviewed information about diet with the paitent to help with weight loss    THN CM Short Term Goal #1   patient will check her blood sugars daily for the next 30 days  THN CM Short Term Goal #1 Start Date  01/05/17  Interventions for Short Term Goal #1  RN Health Coach encouraged the patient to check blood sugars daily  THN CM Short Term Goal #2   Patient will work on her diet to lose two pounds in 30 days   THN CM Short Term Goal #2 Start Date  01/05/17  [facility] CM Short Term Goal #2 Met Date  02/08/17  Interventions for Short Term Goal #2  [place] sent the patient educational information on diet. Also talked about information sent      Plan: RN Health Coach will contact patient in the month of December and patient agrees to next outreach.

## 2017-02-16 ENCOUNTER — Other Ambulatory Visit: Payer: Self-pay | Admitting: Family Medicine

## 2017-02-16 ENCOUNTER — Telehealth: Payer: Self-pay | Admitting: *Deleted

## 2017-02-16 ENCOUNTER — Telehealth: Payer: Self-pay | Admitting: Cardiovascular Disease

## 2017-02-16 MED ORDER — PAROXETINE HCL 20 MG PO TABS: 20.0000 mg | ORAL_TABLET | Freq: Every day | ORAL | 3 refills | Status: DC

## 2017-02-16 NOTE — Progress Notes (Signed)
-  paxil

## 2017-02-16 NOTE — Telephone Encounter (Signed)
Patient called in stating that she was woken in the middle of the night with chest discomfort. It was on the left side and now she has a pain in her left arm. She stated that she got the flu shot last week and her left arm has been hurting since then. The chest discomfort resolved on its own and she has not had the discomfort since the one incidence.   An appointment has been made for tomorrow at 11:30 with Kerin Ransom, PA. She has been advised to the go the ED but stated that she will wait and if the pain returned she would call 911.

## 2017-02-16 NOTE — Telephone Encounter (Signed)
Patient called stating she woke up with chest pain on her left side, patient states it went away when she laid in the bed for a while. Please advise [phone] or [phone]

## 2017-02-16 NOTE — Telephone Encounter (Signed)
Called patient back and advised she contact her cardiologist. She stated she called them first but was left on hold so she called here. I sent a fax to the attention of scheduling letting them know to call her back to schedule an appt. Patient aware

## 2017-02-17 ENCOUNTER — Ambulatory Visit (INDEPENDENT_AMBULATORY_CARE_PROVIDER_SITE_OTHER): Payer: Medicare Other | Admitting: Cardiology

## 2017-02-17 ENCOUNTER — Encounter: Payer: Self-pay | Admitting: Cardiology

## 2017-02-17 ENCOUNTER — Encounter: Payer: Self-pay | Admitting: *Deleted

## 2017-02-17 VITALS — BP 124/74 | HR 71 | Ht 65.0 in | Wt 203.0 lb

## 2017-02-17 DIAGNOSIS — E1159 Type 2 diabetes mellitus with other circulatory complications: Secondary | ICD-10-CM

## 2017-02-17 DIAGNOSIS — Z8249 Family history of ischemic heart disease and other diseases of the circulatory system: Secondary | ICD-10-CM | POA: Diagnosis not present

## 2017-02-17 DIAGNOSIS — M1990 Unspecified osteoarthritis, unspecified site: Secondary | ICD-10-CM | POA: Insufficient documentation

## 2017-02-17 DIAGNOSIS — I10 Essential (primary) hypertension: Secondary | ICD-10-CM

## 2017-02-17 DIAGNOSIS — E785 Hyperlipidemia, unspecified: Secondary | ICD-10-CM

## 2017-02-17 DIAGNOSIS — R079 Chest pain, unspecified: Secondary | ICD-10-CM | POA: Diagnosis not present

## 2017-02-17 DIAGNOSIS — C73 Malignant neoplasm of thyroid gland: Secondary | ICD-10-CM | POA: Insufficient documentation

## 2017-02-17 DIAGNOSIS — N951 Menopausal and female climacteric states: Secondary | ICD-10-CM | POA: Insufficient documentation

## 2017-02-17 MED ORDER — ISOSORBIDE MONONITRATE ER 30 MG PO TB24: 15.0000 mg | ORAL_TABLET | Freq: Every day | ORAL | 1 refills | Status: DC

## 2017-02-17 NOTE — Assessment & Plan Note (Signed)
On oral agents 

## 2017-02-17 NOTE — Assessment & Plan Note (Signed)
Father had an MI in his 50's 

## 2017-02-17 NOTE — Assessment & Plan Note (Signed)
Pt seen as a referral from her PCP after she complained of SSCP that awakened her at 3 am 48 hrs ago

## 2017-02-17 NOTE — Assessment & Plan Note (Signed)
On statin Rx 

## 2017-02-17 NOTE — Assessment & Plan Note (Addendum)
Controlled. Echo Sept 2017-normal LVF, mild LVH, garde 1 DD

## 2017-02-17 NOTE — Progress Notes (Signed)
02/17/2017 Jennifer Blackburn   1945-11-14  [id number]  Primary Physician Fayrene Helper, MD Primary Cardiologist: Dr Gwenlyn Found  HPI:  71 year old, moderately overweight, married [place] American female, who has a history of minimal CAD by cath in June 2005 with normal LV function. She had a Lexiscan in 2016 that was low risk. Her other problems include treated hypertension, diabetes and dyslipidemia. She does have a strong family history of heart disease with a father that died of an MI at age 67. She was referred to [place] today for evaluation of chest pain. The pt says she was awakened around 3 am 48 hrs ago with chest discomfort that lasted about 20 minutes. She denies any palpations, SOB, orthopnea, nausea or diaphoresis. She has noted mild LE edema. She didn't take anything for this "I just lay there and didn't move". She denies any new exertional chest pain or DOE. She says she had a flu shot two weeks and felt poorly after that but this was improving. She had an echo and sleep study last year that were unremarkable.    Current Outpatient Medications  Medication Sig Dispense Refill  . aspirin EC 81 MG tablet Take 1 tablet (81 mg total) by mouth daily.    . ferrous sulfate 325 (65 FE) MG tablet Take 325 mg by mouth daily with breakfast.    . fluticasone (FLONASE) 50 MCG/ACT nasal spray Place 2 sprays into both nostrils daily. (Patient taking differently: Place 2 sprays into both nostrils daily as needed for allergies. ) 16 g 6  . gabapentin (NEURONTIN) 100 MG capsule TAKE 1 CAPSULE(100 MG) BY MOUTH THREE TIMES DAILY (Patient taking differently: Take 100 mg by mouth at bedtime. ) 270 capsule 1  . glipiZIDE (GLUCOTROL XL) 5 MG 24 hr tablet Take 1 tablet (5 mg total) daily with breakfast by mouth. 30 tablet 3  . glucose blood (ACCU-CHEK AVIVA PLUS) test strip Use as instructed four times daily dx E11.65 150 each 5  . imipramine (TOFRANIL) 25 MG tablet TAKE 4 TABLETS BY MOUTH EVERY NIGHT AT  BEDTIME (Patient taking differently: Take 50 mg by mouth at bedtime. ) 360 tablet 1  . Lancets (ACCU-CHEK MULTICLIX) lancets Use as instructed three times daily dx 250.01 100 each 5  . levothyroxine (SYNTHROID) 200 MCG tablet Take 1 tablet (200 mcg total) by mouth daily before breakfast. 30 tablet 5  . loratadine (CLARITIN) 10 MG tablet Take 10 mg by mouth daily as needed for allergies.     . Menthol, Topical Analgesic, (ICY HOT EX) Apply 1 application topically 3 (three) times daily as needed (for back pain.).    Marland Kitchen metFORMIN (GLUCOPHAGE) 1000 MG tablet Take 1 tablet (1,000 mg total) by mouth 2 (two) times daily. 180 tablet 1  . metoprolol (LOPRESSOR) 50 MG tablet Take 1 tablet (50 mg total) by mouth 2 (two) times daily. 60 tablet 12  . Multiple Vitamin (MULTIVITAMIN) tablet Take 1 tablet by mouth daily.    Marland Kitchen PARoxetine (PAXIL) 20 MG tablet Take 1 tablet (20 mg total) by mouth daily. 90 tablet 3  . pioglitazone (ACTOS) 30 MG tablet Take 1 tablet by mouth daily.  0  . pravastatin (PRAVACHOL) 80 MG tablet Take 1 tablet (80 mg total) by mouth daily. (Patient taking differently: Take 80 mg by mouth at bedtime. ) 90 tablet 1  . quinapril (ACCUPRIL) 10 MG tablet Take 1 tablet (10 mg total) by mouth daily. 90 tablet 1  . verapamil (VERELAN PM) 180  MG 24 hr capsule TAKE ONE CAPSULE BY MOUTH EVERY DAY 30 capsule 6  . vitamin B-12 (CYANOCOBALAMIN) 1000 MCG tablet Take 1 tablet (1,000 mcg total) by mouth daily. 30 tablet 5  . Vitamin D, Ergocalciferol, (DRISDOL) 50000 units CAPS capsule TAKE 1 CAPSULE BY MOUTH EVERY 7 DAYS (Patient taking differently: Take 50,000 Units by mouth every Wednesday. ) 12 capsule 1  . isosorbide mononitrate (IMDUR) 30 MG 24 hr tablet Take 0.5 tablets (15 mg total) by mouth daily. 45 tablet 1   No current facility-administered medications for this visit.     Allergies  Allergen Reactions  . Daypro [Oxaprozin] Hives  . Sulfonamide Derivatives Hives    Past Medical History:    Diagnosis Date  . Anxiety   . CAD (coronary artery disease)   . Depression   . Diabetes mellitus type II    without complication  . DJD (degenerative joint disease) of lumbar spine   . Hypercholesteremia   . Hyperlipidemia   . Hypertension    benign   . Thyroid cancer ([place]) 2001    Social History   Socioeconomic History  . Marital status: Married    Spouse name: Not on file  . Number of children: 2  . Years of education: 72  . Highest education level: Not on file  Social Needs  . Financial resource strain: Not on file  . Food insecurity - worry: Not on file  . Food insecurity - inability: Not on file  . Transportation needs - medical: Not on file  . Transportation needs - non-medical: Not on file  Occupational History  . Occupation: retired     Comment: bank  Tobacco Use  . Smoking status: Passive Smoke Exposure - Never Smoker  . Smokeless tobacco: Never Used  . Tobacco comment: Husband smokes in the home  Substance and Sexual Activity  . Alcohol use: No  . Drug use: No  . Sexual activity: No  Other Topics Concern  . Not on file  Social History Narrative   Lives with husband, at home   No caffeine     Family History  Problem Relation Age of Onset  . Heart attack Father   . Heart failure Mother   . Asthma Daughter   . Sleep apnea Son        CPAP  . Colon cancer Neg Hx      Review of Systems: General: negative for chills, fever, night sweats or weight changes.  Cardiovascular: negative for chest pain, dyspnea on exertion, edema, orthopnea, palpitations, paroxysmal nocturnal dyspnea or shortness of breath Dermatological: negative for rash Respiratory: negative for cough or wheezing Urologic: negative for hematuria Abdominal: negative for nausea, vomiting, diarrhea, bright red blood per rectum, melena, or hematemesis Neurologic: negative for visual changes, syncope, or dizziness All other systems reviewed and are otherwise negative except as noted  above.    Blood pressure 124/74, pulse 71, height 5\' 5"  (1.651 m), weight 203 lb (92.1 kg), last menstrual period 11/11/2016, SpO2 98 %.  General appearance: alert, cooperative, no distress and mildly obese Neck: no carotid bruit and no JVD Lungs: clear to auscultation bilaterally Heart: regular rate and rhythm Extremities: 1+ LE edema Skin: Skin color, texture, turgor normal. No rashes or lesions Neurologic: Grossly normal  EKG NSR  ASSESSMENT AND PLAN:   Chest pain Pt seen as a referral from her PCP after she complained of SSCP that awakened her at 3 am 48 hrs ago  Dyslipidemia On statin Rx  Essential hypertension Controlled. Echo Sept 2017-normal LVF, mild LVH, garde 1 DD  Type 2 diabetes mellitus with vascular disease (H[facility]) On oral agents  Family history of coronary artery disease in father Father had an MI in his 34's   PLAN  I added low dose Imdur. Check treadmill Myoview.   Kerin Ransom PA-C 02/17/2017 1:04 PM

## 2017-02-17 NOTE — Patient Instructions (Signed)
Medication Instructions: Your physician recommends that you continue on your current medications as directed. Please refer to the Current Medication list given to you today.  START Isosorbide (Imdur) 30 mg--take 1/2 tab (15 mg) daily.   Testing/Procedures: Your physician has requested that you have an exercise stress myoview. For further information please visit [email or web address]. Please follow instruction sheet, as given.  Hold Metoprolol for this test.  Follow-Up: Your physician recommends that you schedule a follow-up appointment in: January with Dr. Gwenlyn Found.  If you need a refill on your cardiac medications before your next appointment, please call your pharmacy.

## 2017-02-18 ENCOUNTER — Telehealth (HOSPITAL_COMMUNITY): Payer: Self-pay

## 2017-02-18 NOTE — Telephone Encounter (Signed)
Encounter complete. 

## 2017-02-22 ENCOUNTER — Inpatient Hospital Stay (HOSPITAL_COMMUNITY)
Admission: RE | Admit: 2017-02-22 | Payer: Medicare Other | Source: Ambulatory Visit | Attending: Cardiology | Admitting: Cardiology

## 2017-02-23 ENCOUNTER — Ambulatory Visit (HOSPITAL_COMMUNITY)
Admission: RE | Admit: 2017-02-23 | Discharge: 2017-02-23 | Disposition: A | Discharge status: Home / Self Care | Payer: Medicare Other | Source: Ambulatory Visit | Attending: Cardiovascular Disease | Admitting: Cardiovascular Disease

## 2017-02-23 DIAGNOSIS — R0602 Shortness of breath: Secondary | ICD-10-CM | POA: Insufficient documentation

## 2017-02-23 DIAGNOSIS — Z8249 Family history of ischemic heart disease and other diseases of the circulatory system: Secondary | ICD-10-CM | POA: Diagnosis not present

## 2017-02-23 DIAGNOSIS — I10 Essential (primary) hypertension: Secondary | ICD-10-CM | POA: Insufficient documentation

## 2017-02-23 DIAGNOSIS — R5383 Other fatigue: Secondary | ICD-10-CM | POA: Diagnosis not present

## 2017-02-23 DIAGNOSIS — E119 Type 2 diabetes mellitus without complications: Secondary | ICD-10-CM | POA: Insufficient documentation

## 2017-02-23 DIAGNOSIS — R079 Chest pain, unspecified: Secondary | ICD-10-CM

## 2017-02-23 DIAGNOSIS — E669 Obesity, unspecified: Secondary | ICD-10-CM | POA: Insufficient documentation

## 2017-02-23 DIAGNOSIS — Z6833 Body mass index (BMI) 33.0-33.9, adult: Secondary | ICD-10-CM | POA: Insufficient documentation

## 2017-02-23 LAB — MYOCARDIAL PERFUSION IMAGING
LV dias vol: 113 mL (ref 46–106)
LV sys vol: 37 mL
Peak HR: 89 {beats}/min
Rest HR: 74 {beats}/min
SDS: 0
SRS: 0
SSS: 0
TID: 1.14

## 2017-02-23 MED ORDER — TECHNETIUM TC 99M TETROFOSMIN IV KIT
9.8000 | PACK | Freq: Once | INTRAVENOUS | Status: AC | PRN
  Administered 2017-02-23: 9.8 via INTRAVENOUS
  Filled 2017-02-23: qty 10

## 2017-02-23 MED ORDER — TECHNETIUM TC 99M TETROFOSMIN IV KIT
29.2000 | PACK | Freq: Once | INTRAVENOUS | Status: AC | PRN
  Administered 2017-02-23: 29.2 via INTRAVENOUS
  Filled 2017-02-23: qty 30

## 2017-02-23 MED ORDER — REGADENOSON 0.4 MG/5ML IV SOLN
0.4000 mg | Freq: Once | INTRAVENOUS | Status: AC
  Administered 2017-02-23: 0.4 mg via INTRAVENOUS

## 2017-03-02 ENCOUNTER — Telehealth: Payer: Self-pay | Admitting: Cardiovascular Disease

## 2017-03-02 NOTE — Telephone Encounter (Signed)
New Message  Pt call requesting to speak with RN about MYO results. Please call back to discuss

## 2017-03-02 NOTE — Telephone Encounter (Signed)
Notes recorded by Erlene Quan, PA-C on 02/25/2017 at 4:05 PM EST Please let the pt know her stress test was normal   Patient aware and verbalized understanding.

## 2017-03-11 ENCOUNTER — Other Ambulatory Visit: Payer: Self-pay

## 2017-03-11 NOTE — Patient Outreach (Signed)
[place] [facility]) Care Management  03/11/2017  Jennifer Blackburn Sep 23, 1945 [id number]   Telephone call to patient for monthly outreach.  No answer. HIPAA compliant voice message left with contact information.  Plan:  RN Health Coach will contact patient in the month of January.  Lazaro Arms RN, BSN, [place] Direct Dial:  [phone] Fax: [phone]

## 2017-03-21 DIAGNOSIS — F32A Depression, unspecified: Secondary | ICD-10-CM | POA: Insufficient documentation

## 2017-03-21 DIAGNOSIS — I10 Essential (primary) hypertension: Secondary | ICD-10-CM | POA: Insufficient documentation

## 2017-03-21 DIAGNOSIS — F329 Major depressive disorder, single episode, unspecified: Secondary | ICD-10-CM | POA: Insufficient documentation

## 2017-03-21 DIAGNOSIS — E7800 Pure hypercholesterolemia, unspecified: Secondary | ICD-10-CM | POA: Insufficient documentation

## 2017-03-21 DIAGNOSIS — E785 Hyperlipidemia, unspecified: Secondary | ICD-10-CM | POA: Insufficient documentation

## 2017-03-21 DIAGNOSIS — I2510 Atherosclerotic heart disease of native coronary artery without angina pectoris: Secondary | ICD-10-CM | POA: Insufficient documentation

## 2017-03-21 DIAGNOSIS — F419 Anxiety disorder, unspecified: Secondary | ICD-10-CM | POA: Insufficient documentation

## 2017-03-23 ENCOUNTER — Other Ambulatory Visit: Payer: Self-pay | Admitting: Family Medicine

## 2017-03-24 ENCOUNTER — Telehealth: Payer: Self-pay | Admitting: Cardiovascular Disease

## 2017-03-24 MED ORDER — VERAPAMIL HCL ER 180 MG PO TBCR: 180.0000 mg | EXTENDED_RELEASE_TABLET | Freq: Every day | ORAL | 1 refills | Status: DC

## 2017-03-24 NOTE — Telephone Encounter (Signed)
Routed to pharmD for options

## 2017-03-24 NOTE — Telephone Encounter (Signed)
New message  Patient calling to request tablet instead of capsule. Please call  Pt c/o medication issue:  1. Name of Medication: verapamil (VERELAN PM) 180 MG 24 hr capsule  2. How are you currently taking this medication (dosage and times per day)? TAKE ONE CAPSULE BY MOUTH EVERY DAY  3. Are you having a reaction (difficulty breathing--STAT)? No  4. What is your medication issue? Capsule is too costly. Patient states her pharmacy suggested the tablet instead (cheaper cost)

## 2017-03-24 NOTE — Telephone Encounter (Signed)
Rx for verapamil 180mg  CR tablet sent to prefer pharmacy

## 2017-03-29 ENCOUNTER — Ambulatory Visit (INDEPENDENT_AMBULATORY_CARE_PROVIDER_SITE_OTHER): Payer: Medicare Other | Admitting: Family Medicine

## 2017-03-29 ENCOUNTER — Encounter: Payer: Self-pay | Admitting: Family Medicine

## 2017-03-29 VITALS — BP 110/74 | HR 75 | Resp 16 | Ht 65.0 in | Wt 212.0 lb

## 2017-03-29 DIAGNOSIS — I10 Essential (primary) hypertension: Secondary | ICD-10-CM | POA: Diagnosis not present

## 2017-03-29 DIAGNOSIS — E1159 Type 2 diabetes mellitus with other circulatory complications: Secondary | ICD-10-CM | POA: Diagnosis not present

## 2017-03-29 DIAGNOSIS — R600 Localized edema: Secondary | ICD-10-CM

## 2017-03-29 DIAGNOSIS — E785 Hyperlipidemia, unspecified: Secondary | ICD-10-CM

## 2017-03-29 NOTE — Patient Instructions (Signed)
F/u as before  Actos does cause leg selling which you have so stopping was right. Keep blood sugar controlled with diet aand medication and exercise as you are doing   I will let Dr Bradly Bienenstock know that you were here with a faxed letter andd that you did have one plus leg swelling, keep appt next weeek,  Congrats on much improved blood sugar  All the best for 2019!

## 2017-03-29 NOTE — Progress Notes (Signed)
   Jennifer Blackburn     MRN: [id number]      DOB: 1946/01/29   HPI Jennifer Blackburn is here with a c/o leg swelling since staring actosResumed tujeo 40 units last week Saturday and stopped actos am blood sugar today is 95 ROS Denies recent fever or chills. Denies sinus pressure, nasal congestion, ear pain or sore throat. Denies chest congestion, productive cough or wheezing. Denies chest pains, palpitations , pND or orthopnea Denies abdominal pain, nausea, vomiting,diarrhea or constipation.   Denies dysuria, frequency, hesitancy or incontinence. Chronic  joint pain,  and limitation in mobility. Denies headaches, seizures, numbness, or tingling. Denies uncontrolled  depression, anxiety or insomnia. Denies skin break down or rash.   PE  BP 110/74   Pulse 75   Resp 16   Ht 5\' 5"  (1.651 m)   Wt 212 lb (96.2 kg)   LMP 11/11/2016   SpO2 94%   BMI 35.28 kg/m    Patient alert and oriented and in no cardiopulmonary distress.  HEENT: No facial asymmetry, EOMI,   oropharynx pink and moist.  Neck supple no JVD, no mass.  Chest: Clear to auscultation bilaterally.  CVS: S1, S2 no murmurs, no S3.Regular rate.  ABD: Soft non tender.   Ext: one plus pitting edema  MS: Adequate ROM spine, shoulders, hips and knees.  Skin: Intact, no ulcerations or rash noted.  Psych: Good eye contact, normal affect. Memory intact not anxious or depressed appearing.  CNS: CN 2-12 intact, power,  normal throughout.no focal deficits noted.   Assessment & Plan  Lower leg edema Bilateral lower extremity edema precipitated by use of actos, marked improvement in past 5 days per pt history after discontinuing the medication ,and she reports good blood sugar control on current regime now that she has her insulin once more, she is advised to disciuss with her endo next week and I believe that actos is indeed the trigger for her leg edema  Type 2 diabetes mellitus with vascular disease ([place]) Improved markedly ,  managed by endo, will need medication adjustment since actos has caused leg swelling   Hyperlipidemia Hyperlipidemia:Low fat diet discussed and encouraged.   Lipid Panel  Lab Results  Component Value Date   CHOL 172 07/13/2016   HDL 42 (L) 07/13/2016   LDLCALC 100 (H) 07/13/2016   TRIG 148 07/13/2016   CHOLHDL 4.1 07/13/2016   Updated lab needed at/ before next visit.     Essential hypertension Controlled, no change in medication DASH diet and commitment to daily physical activity for a minimum of 30 minutes discussed and encouraged, as a part of hypertension management. The importance of attaining a healthy weight is also discussed.  BP/Weight 04/05/2017 03/29/2017 02/23/2017 02/17/2017 01/31/2017 01/03/2017 0/63/0160  Systolic BP 109 323 - 557 322 025 -  Diastolic BP 64 74 - 74 70 66 -  Wt. (Lbs) 203.6 212 203 203 201 202.12 193  BMI 33.88 35.28 33.78 33.78 33.45 33.63 32.12

## 2017-04-04 ENCOUNTER — Other Ambulatory Visit: Payer: Self-pay

## 2017-04-04 DIAGNOSIS — E7849 Other hyperlipidemia: Secondary | ICD-10-CM | POA: Diagnosis not present

## 2017-04-04 DIAGNOSIS — I10 Essential (primary) hypertension: Secondary | ICD-10-CM | POA: Diagnosis not present

## 2017-04-04 DIAGNOSIS — E1165 Type 2 diabetes mellitus with hyperglycemia: Secondary | ICD-10-CM | POA: Diagnosis not present

## 2017-04-04 NOTE — Patient Outreach (Signed)
[facility] [facility]) Care Management  04/04/2017  Jennifer Blackburn [birth year] [id number]   RN Health made an outreach to the patient for monthly assessment. No answer the line was busy.  Plan: RN Health Coach will attempt to make outreach attempt to the patient in one business.  Lazaro Arms RN, BSN, [place] Direct Dial:  [phone] Fax: [phone]

## 2017-04-04 NOTE — Patient Outreach (Signed)
[place] [facility]) Care Management  04/04/2017  Jennifer Blackburn [birth year] [id number]   Telephone call place to the patient for monthly assessment.  The patient asked if I could call her at a later time.  She stated that she has a headache.  Plan:  RN Health Coach will contact the patient within three business days.  Lazaro Arms RN, BSN, [place] Direct Dial:  [phone] Fax: [phone]

## 2017-04-05 ENCOUNTER — Encounter: Payer: Self-pay | Admitting: Cardiovascular Disease

## 2017-04-05 ENCOUNTER — Ambulatory Visit (INDEPENDENT_AMBULATORY_CARE_PROVIDER_SITE_OTHER): Payer: Medicare Other | Admitting: Cardiovascular Disease

## 2017-04-05 ENCOUNTER — Other Ambulatory Visit: Payer: Self-pay

## 2017-04-05 DIAGNOSIS — E785 Hyperlipidemia, unspecified: Secondary | ICD-10-CM

## 2017-04-05 DIAGNOSIS — I10 Essential (primary) hypertension: Secondary | ICD-10-CM

## 2017-04-05 NOTE — Progress Notes (Signed)
04/05/2017 Jennifer Blackburn   Jun 23, 1945  [id number]  Primary Physician Fayrene Helper, MD Primary Cardiologist: Lorretta Harp MD FACP, [place], [place], [place]  HPI:  Jennifer Blackburn is a 72 y.o.  moderately overweight, married [place] American female, mother of 2, grandmother to 4 grandchildren who I last saw in the office  05/14/16  . She has a history of minimal CAD by cath back in June 2005 with normal LV function. Her other problems include treated hypertension, diabetes and dyslipidemia. She does have a strong family history of heart disease with a father that died of an MI at age 63. Most recent lipid profile performed by Dr. Moshe Cipro on 11/14/14 revealing a total cholesterol 175, LDL of 112 and HDL of 42.. Dr. Moshe Cipro continues to follow her with profile . She has had several episodes of nocturnal chest pressure which has awakened her from sleep with a "heavy sensation on her chest and a feeling of diaphoresis since I last saw her but these are infrequent and have not changed frequency or severity. She did see Kerin Ransom in the office 02/17/17 complaining of chest pain and a Myoview stress test performed 02/23/17 was low risk. Since that time she's had no recurrent symptoms.     Current Meds  Medication Sig  . aspirin EC 81 MG tablet Take 1 tablet (81 mg total) by mouth daily.  . ferrous sulfate 325 (65 FE) MG tablet Take 325 mg by mouth daily with breakfast.  . fluticasone (FLONASE) 50 MCG/ACT nasal spray Place 2 sprays into both nostrils daily. (Patient taking differently: Place 2 sprays into both nostrils daily as needed for allergies. )  . gabapentin (NEURONTIN) 100 MG capsule TAKE 1 CAPSULE(100 MG) BY MOUTH THREE TIMES DAILY (Patient taking differently: Take 100 mg by mouth at bedtime. )  . glipiZIDE (GLUCOTROL XL) 5 MG 24 hr tablet Take 1 tablet (5 mg total) daily with breakfast by mouth.  Marland Kitchen glucose blood (ACCU-CHEK AVIVA PLUS) test strip Use as instructed four times daily dx  E11.65  . imipramine (TOFRANIL) 25 MG tablet TAKE 4 TABLETS BY MOUTH EVERY NIGHT AT BEDTIME (Patient taking differently: Take 50 mg by mouth at bedtime. )  . isosorbide mononitrate (IMDUR) 30 MG 24 hr tablet Take 0.5 tablets (15 mg total) by mouth daily.  . Lancets (ACCU-CHEK MULTICLIX) lancets Use as instructed three times daily dx 250.01  . levothyroxine (SYNTHROID) 200 MCG tablet Take 1 tablet (200 mcg total) by mouth daily before breakfast.  . loratadine (CLARITIN) 10 MG tablet Take 10 mg by mouth daily as needed for allergies.   . Menthol, Topical Analgesic, (ICY HOT EX) Apply 1 application topically 3 (three) times daily as needed (for back pain.).  Marland Kitchen metFORMIN (GLUCOPHAGE) 1000 MG tablet Take 1 tablet (1,000 mg total) by mouth 2 (two) times daily.  . metoprolol (LOPRESSOR) 50 MG tablet Take 1 tablet (50 mg total) by mouth 2 (two) times daily.  . Multiple Vitamin (MULTIVITAMIN) tablet Take 1 tablet by mouth daily.  Marland Kitchen PARoxetine (PAXIL) 20 MG tablet Take 1 tablet (20 mg total) by mouth daily.  . pioglitazone (ACTOS) 30 MG tablet Take 1 tablet by mouth daily.  . pravastatin (PRAVACHOL) 80 MG tablet Take 1 tablet (80 mg total) by mouth daily. (Patient taking differently: Take 80 mg by mouth at bedtime. )  . quinapril (ACCUPRIL) 10 MG tablet Take 1 tablet (10 mg total) by mouth daily.  . verapamil (CALAN-SR) 180 MG CR  tablet Take 1 tablet (180 mg total) by mouth at bedtime.  . vitamin B-12 (CYANOCOBALAMIN) 1000 MCG tablet Take 1 tablet (1,000 mcg total) by mouth daily.  . Vitamin D, Ergocalciferol, (DRISDOL) 50000 units CAPS capsule TAKE 1 CAPSULE BY MOUTH EVERY 7 DAYS (Patient taking differently: Take 50,000 Units by mouth every Wednesday. )     Allergies  Allergen Reactions  . Daypro [Oxaprozin] Hives  . Sulfonamide Derivatives Hives    Social History   Socioeconomic History  . Marital status: Married    Spouse name: Not on file  . Number of children: 2  . Years of education: 43    . Highest education level: Not on file  Social Needs  . Financial resource strain: Not on file  . Food insecurity - worry: Not on file  . Food insecurity - inability: Not on file  . Transportation needs - medical: Not on file  . Transportation needs - non-medical: Not on file  Occupational History  . Occupation: retired     Comment: bank  Tobacco Use  . Smoking status: Passive Smoke Exposure - Never Smoker  . Smokeless tobacco: Never Used  . Tobacco comment: Husband smokes in the home  Substance and Sexual Activity  . Alcohol use: No  . Drug use: No  . Sexual activity: No  Other Topics Concern  . Not on file  Social History Narrative   Lives with husband, at home   No caffeine     Review of Systems: General: negative for chills, fever, night sweats or weight changes.  Cardiovascular: negative for chest pain, dyspnea on exertion, edema, orthopnea, palpitations, paroxysmal nocturnal dyspnea or shortness of breath Dermatological: negative for rash Respiratory: negative for cough or wheezing Urologic: negative for hematuria Abdominal: negative for nausea, vomiting, diarrhea, bright red blood per rectum, melena, or hematemesis Neurologic: negative for visual changes, syncope, or dizziness All other systems reviewed and are otherwise negative except as noted above.    Blood pressure 124/64, pulse 72, height 5\' 5"  (1.651 m), weight 203 lb 9.6 oz (92.4 kg), last menstrual period 11/11/2016.  General appearance: alert and no distress Neck: no adenopathy, no carotid bruit, no JVD, supple, symmetrical, trachea midline and thyroid not enlarged, symmetric, no tenderness/mass/nodules Lungs: clear to auscultation bilaterally Heart: regular rate and rhythm, S1, S2 normal, no murmur, click, rub or gallop Extremities: extremities normal, atraumatic, no cyanosis or edema Pulses: 2+ and symmetric Skin: Skin color, texture, turgor normal. No rashes or lesions Neurologic: Alert and oriented  X 3, normal strength and tone. Normal symmetric reflexes. Normal coordination and gait  EKG not performed today  ASSESSMENT AND PLAN:   Dyslipidemia History of dyslipidemia on statin therapy with lipid profile performed 07/13/16 revealed a total cholesterol 172, LDL 100 and HDL of 42.  Essential hypertension History of essential hypertension blood pressures measured at 153/70 which subsequently fell to 124/64 at the end of the office visit.. She is on metoprolol, quinapril and verapamil. Continue current meds at current dosing.      Lorretta Harp MD FACP,FACC,FAHA, [facility] 04/05/2017 11:04 AM

## 2017-04-05 NOTE — Patient Outreach (Signed)
[facility]) Care Management  04/05/2017  Jennifer Blackburn September 13, 1945 [id number]   Telephone call placed to the patient for monthly outreach. Husband answered the phone and stated that the patient is at a doctor's appointment. HIPAA compliant message with contact information left   Plan:  Cassia will outreach the patient in the month of February.  Lazaro Arms RN, BSN, [place] Direct Dial:  [phone] Fax: [phone]

## 2017-04-05 NOTE — Assessment & Plan Note (Signed)
History of dyslipidemia on statin therapy with lipid profile performed 07/13/16 revealed a total cholesterol 172, LDL 100 and HDL of 42.

## 2017-04-05 NOTE — Assessment & Plan Note (Addendum)
History of essential hypertension blood pressures measured at 153/70 which subsequently fell to 124/64 at the end of the office visit.. She is on metoprolol, quinapril and verapamil. Continue current meds at current dosing.

## 2017-04-05 NOTE — Patient Instructions (Signed)

## 2017-04-07 ENCOUNTER — Ambulatory Visit: Payer: Medicare Other | Admitting: Family Medicine

## 2017-04-08 NOTE — Assessment & Plan Note (Signed)
Bilateral lower extremity edema precipitated by use of actos, marked improvement in past 5 days per pt history after discontinuing the medication ,and she reports good blood sugar control on current regime now that she has her insulin once more, she is advised to disciuss with her endo next week and I believe that actos is indeed the trigger for her leg edema

## 2017-04-08 NOTE — Assessment & Plan Note (Signed)
Controlled, no change in medication DASH diet and commitment to daily physical activity for a minimum of 30 minutes discussed and encouraged, as a part of hypertension management. The importance of attaining a healthy weight is also discussed.  BP/Weight 04/05/2017 03/29/2017 02/23/2017 02/17/2017 01/31/2017 01/03/2017 4/69/5072  Systolic BP 257 505 - 183 358 251 -  Diastolic BP 64 74 - 74 70 66 -  Wt. (Lbs) 203.6 212 203 203 201 202.12 193  BMI 33.88 35.28 33.78 33.78 33.45 33.63 32.12

## 2017-04-08 NOTE — Assessment & Plan Note (Signed)
Hyperlipidemia:Low fat diet discussed and encouraged.   Lipid Panel  Lab Results  Component Value Date   CHOL 172 07/13/2016   HDL 42 (L) 07/13/2016   LDLCALC 100 (H) 07/13/2016   TRIG 148 07/13/2016   CHOLHDL 4.1 07/13/2016   Updated lab needed at/ before next visit.

## 2017-04-08 NOTE — Assessment & Plan Note (Signed)
Improved markedly , managed by endo, will need medication adjustment since actos has caused leg swelling

## 2017-04-12 DIAGNOSIS — C73 Malignant neoplasm of thyroid gland: Secondary | ICD-10-CM | POA: Diagnosis not present

## 2017-04-12 DIAGNOSIS — E7801 Familial hypercholesterolemia: Secondary | ICD-10-CM | POA: Diagnosis not present

## 2017-04-12 DIAGNOSIS — E669 Obesity, unspecified: Secondary | ICD-10-CM | POA: Diagnosis not present

## 2017-04-12 DIAGNOSIS — E785 Hyperlipidemia, unspecified: Secondary | ICD-10-CM | POA: Diagnosis not present

## 2017-04-12 DIAGNOSIS — I10 Essential (primary) hypertension: Secondary | ICD-10-CM | POA: Diagnosis not present

## 2017-04-12 DIAGNOSIS — E1165 Type 2 diabetes mellitus with hyperglycemia: Secondary | ICD-10-CM | POA: Diagnosis not present

## 2017-04-12 DIAGNOSIS — E118 Type 2 diabetes mellitus with unspecified complications: Secondary | ICD-10-CM | POA: Diagnosis not present

## 2017-04-12 DIAGNOSIS — E6609 Other obesity due to excess calories: Secondary | ICD-10-CM | POA: Diagnosis not present

## 2017-04-12 DIAGNOSIS — E890 Postprocedural hypothyroidism: Secondary | ICD-10-CM | POA: Diagnosis not present

## 2017-04-12 DIAGNOSIS — E039 Hypothyroidism, unspecified: Secondary | ICD-10-CM | POA: Diagnosis not present

## 2017-04-12 DIAGNOSIS — E1159 Type 2 diabetes mellitus with other circulatory complications: Secondary | ICD-10-CM | POA: Diagnosis not present

## 2017-04-12 DIAGNOSIS — D509 Iron deficiency anemia, unspecified: Secondary | ICD-10-CM | POA: Diagnosis not present

## 2017-04-30 ENCOUNTER — Other Ambulatory Visit: Payer: Self-pay | Admitting: Family Medicine

## 2017-05-10 ENCOUNTER — Other Ambulatory Visit: Payer: Self-pay

## 2017-05-10 NOTE — Patient Outreach (Signed)
[place] [facility]) Care Management  [place]  05/10/2017   Jennifer Blackburn 06/11/1945 [id number]  Subjective:  Telephone call to the patient for monthly assessment.  HIPAA verified. The patient is doing well.  She denies any pain or falls. The patient states that she is adherent with her medications. The patient states that her blood sugars have been running a little higher than usual because her medications were changed. She was taken off the Actos due to swelling and started on Toujeo 30 units bid.   She states that he blood sugars are ranging from 125-150.  She is watching the carb's in her diet and riding her stationary bike 20 minutes daily.  She stated that she stays hydrated drinking water and unsweetened tea.  The patient states that she is having dental surgery to have two wisdom teeth removed under conscience sedation next week.  The patient has an appointment with Dr. Ronnald Collum in the middle of March and have her a1c rechecked. She will also see Dr Moshe Cipro her primary care.  She just had an appointment with Dr Gwenlyn Found her cardiologist in December.  Objective:   Encounter Medications:  Outpatient Encounter Medications as of 05/10/2017  Medication Sig  . aspirin EC 81 MG tablet Take 1 tablet (81 mg total) by mouth daily.  . ferrous sulfate 325 (65 FE) MG tablet Take 325 mg by mouth daily with breakfast.  . fluticasone (FLONASE) 50 MCG/ACT nasal spray Place 2 sprays into both nostrils daily. (Patient taking differently: Place 2 sprays into both nostrils daily as needed for allergies. )  . gabapentin (NEURONTIN) 100 MG capsule TAKE 1 CAPSULE(100 MG) BY MOUTH THREE TIMES DAILY (Patient taking differently: Take 100 mg by mouth at bedtime. )  . glipiZIDE (GLUCOTROL XL) 5 MG 24 hr tablet Take 1 tablet (5 mg total) daily with breakfast by mouth.  Marland Kitchen imipramine (TOFRANIL) 25 MG tablet TAKE 4 TABLETS BY MOUTH EVERY NIGHT AT BEDTIME (Patient taking differently: Take 50 mg by mouth  at bedtime. )  . isosorbide mononitrate (IMDUR) 30 MG 24 hr tablet Take 0.5 tablets (15 mg total) by mouth daily.  Marland Kitchen levothyroxine (SYNTHROID) 200 MCG tablet Take 1 tablet (200 mcg total) by mouth daily before breakfast.  . loratadine (CLARITIN) 10 MG tablet Take 10 mg by mouth daily as needed for allergies.   . Menthol, Topical Analgesic, (ICY HOT EX) Apply 1 application topically 3 (three) times daily as needed (for back pain.).  Marland Kitchen metFORMIN (GLUCOPHAGE) 1000 MG tablet TAKE 1 TABLET TWICE A DAY  . metoprolol (LOPRESSOR) 50 MG tablet Take 1 tablet (50 mg total) by mouth 2 (two) times daily.  . Multiple Vitamin (MULTIVITAMIN) tablet Take 1 tablet by mouth daily.  Marland Kitchen PARoxetine (PAXIL) 20 MG tablet Take 1 tablet (20 mg total) by mouth daily.  . pravastatin (PRAVACHOL) 80 MG tablet Take 1 tablet (80 mg total) by mouth daily. (Patient taking differently: Take 80 mg by mouth at bedtime. )  . quinapril (ACCUPRIL) 10 MG tablet Take 1 tablet (10 mg total) by mouth daily.  . verapamil (CALAN-SR) 180 MG CR tablet Take 1 tablet (180 mg total) by mouth at bedtime.  . vitamin B-12 (CYANOCOBALAMIN) 1000 MCG tablet Take 1 tablet (1,000 mcg total) by mouth daily.  . [DISCONTINUED] calcium-vitamin D (OSCAL WITH D) 500-200 MG-UNIT tablet Take 1 tablet by mouth.  Marland Kitchen glucose blood (ACCU-CHEK AVIVA PLUS) test strip Use as instructed four times daily dx E11.65  . Lancets (ACCU-CHEK  MULTICLIX) lancets Use as instructed three times daily dx 250.01  . pioglitazone (ACTOS) 30 MG tablet Take 1 tablet by mouth daily.  . Vitamin D, Ergocalciferol, (DRISDOL) 50000 units CAPS capsule TAKE 1 CAPSULE BY MOUTH EVERY 7 DAYS (Patient not taking: Reported on 05/10/2017)   No facility-administered encounter medications on file as of 05/10/2017.     Functional Status:  In your present state of health, do you have any difficulty performing the following activities: 01/03/2017 12/09/2016  Hearing? N Y  Comment - patient stated  soemtimes she has problems hearing  Vision? N N  Difficulty concentrating or making decisions? N N  Walking or climbing stairs? N N  Dressing or bathing? N N  Doing errands, shopping? N N  Preparing Food and eating ? N Y  Using the Toilet? N Y  In the past six months, have you accidently leaked urine? N N  Do you have problems with loss of bowel control? N N  Managing your Medications? N Y  Managing your Finances? N Y  Housekeeping or managing your Housekeeping? N Y  Some recent data might be hidden    Fall/Depression Screening: Fall Risk  05/10/2017 02/08/2017 01/05/2017  Falls in the past year? No No No  Number falls in past yr: - - -  Injury with Fall? - - -  Comment - - -  Risk Factor Category  - - -  Risk for fall due to : - - -  Risk for fall due to: Comment - - -  Follow up - - -  Comment - - -   PHQ 2/9 Scores 01/05/2017 01/03/2017 12/09/2016 12/08/2016 07/14/2016 03/23/2016 02/11/2015  PHQ - 2 Score 0 0 0 0 0 0 0  PHQ- 9 Score - 0 - - - - -    Assessment: Patient continues to benefit from health coach outreach for disease management and support.   THN CM Care Plan Problem One     Most Recent Value  THN Long Term Goal   Patient will work on lowering her a1c by 0,2 point in 90 days  [place] Term Goal Start Date  05/10/17  Interventions for Problem One Long Term Goal  [facility] talked with the patient about her medications and diet.  THN CM Short Term Goal #1   patient will check her blood sugars daily for the next 30 days  THN CM Short Term Goal #1 Start Date  05/10/17  [facility] CM Short Term Goal #1 Met Date  05/10/17  Interventions for Short Term Goal #1  [facility] talked with the patient about recording her values.     Plan:  RN Health Coach will contact patient in the month of March and patient agrees to next outreach.  Lazaro Arms RN, BSN, [place] Direct Dial:  [phone] Fax: [phone]

## 2017-05-14 ENCOUNTER — Other Ambulatory Visit: Payer: Self-pay | Admitting: Cardiovascular Disease

## 2017-05-16 NOTE — Telephone Encounter (Signed)
REFILL 

## 2017-05-19 ENCOUNTER — Other Ambulatory Visit: Payer: Self-pay | Admitting: Family Medicine

## 2017-05-27 ENCOUNTER — Ambulatory Visit (INDEPENDENT_AMBULATORY_CARE_PROVIDER_SITE_OTHER): Payer: Medicare Other | Admitting: Family Medicine

## 2017-05-27 ENCOUNTER — Encounter: Payer: Self-pay | Admitting: Family Medicine

## 2017-05-27 VITALS — BP 124/70 | HR 77 | Temp 97.9°F | Ht 65.0 in | Wt 202.5 lb

## 2017-05-27 DIAGNOSIS — M79605 Pain in left leg: Secondary | ICD-10-CM

## 2017-05-27 NOTE — Patient Instructions (Signed)
Increase the gabapentin to 200 to 300 at bedtime Call Dr Moshe Cipro if this does not help

## 2017-05-27 NOTE — Progress Notes (Signed)
Chief Complaint  Patient presents with  . Acute Visit    sleeping issues due to left leg pain in calf area.    Patient is here for an acute visit. Her usual primary care doctor is Tula Nakayama MD. She asks to be seen today because she is worried about left lateral leg pain that awakens her at night every night for the last 6 or 7 days. She states she does not get out of bed, she repositions, the pain goes away.  She is able to go back to sleep. She has not had any fall or injury.  No change in her activity level.  No change in her medications.  No change in her bed or the position where she sleeps. The pain is in the lateral lower leg area, it feels a "burning" and a  "prickling" feeling. She asked to be seen today because she heard from a friend that it might be a "blood clot".  Patient Active Problem List   Diagnosis Date Noted  . Hypertension   . Hyperlipidemia   . Hypercholesteremia   . Depression   . CAD (coronary artery disease)   . Anxiety   . Hypertensive disorder 02/17/2017  . Malignant tumor of thyroid gland ([place]) 02/17/2017  . Menopausal symptom 02/17/2017  . Osteoarthritis 02/17/2017  . Family history of coronary artery disease in father 02/17/2017  . Lumbar spondylosis with myelopathy 07/18/2016  . Laryngopharyngeal reflux (LPR) 06/24/2016  . Type 2 diabetes mellitus with vascular disease ([place]) 01/13/2015  . GERD (gastroesophageal reflux disease) 04/11/2014  . At high risk for falls 12/31/2013  . Postsurgical hypothyroidism 01/05/2013  . Snoring 01/05/2013  . Lower leg edema 09/02/2012  . TMJ arthritis 12/06/2011  . Anxiety and depression 10/16/2011  . Anemia 03/30/2011  . Vitamin D deficiency 10/14/2010  . Chest pain 07/18/2009  . Obesity with body mass index greater than 30 03/30/2008  . Allergic rhinitis 11/23/2007  . Diabetes mellitus type 2 in obese ([place]) 04/14/2007  . Dyslipidemia 04/14/2007  . CARPAL TUNNEL SYNDROME, BILATERAL 04/14/2007  .  Essential hypertension 04/14/2007  . Coronary atherosclerosis 04/14/2007  . Headache 04/14/2007  . Thyroid cancer ([place]) 03/23/1999    Outpatient Encounter Medications as of 05/27/2017  Medication Sig  . aspirin EC 81 MG tablet Take 1 tablet (81 mg total) by mouth daily.  . ferrous sulfate 325 (65 FE) MG tablet Take 325 mg by mouth daily with breakfast.  . fluticasone (FLONASE) 50 MCG/ACT nasal spray Place 2 sprays into both nostrils daily. (Patient taking differently: Place 2 sprays into both nostrils daily as needed for allergies. )  . gabapentin (NEURONTIN) 100 MG capsule TAKE 1 CAPSULE(100 MG) BY MOUTH THREE TIMES DAILY (Patient taking differently: Take 100 mg by mouth at bedtime. )  . glipiZIDE (GLUCOTROL XL) 5 MG 24 hr tablet Take 1 tablet (5 mg total) daily with breakfast by mouth.  Marland Kitchen glucose blood (ACCU-CHEK AVIVA PLUS) test strip Use as instructed four times daily dx E11.65  . imipramine (TOFRANIL) 25 MG tablet TAKE 4 TABLETS BY MOUTH EVERY NIGHT AT BEDTIME (Patient taking differently: Take 50 mg by mouth at bedtime. )  . Insulin Glargine (TOUJEO SOLOSTAR [place]) Inject 30 Units into the skin 2 (two) times daily.  . isosorbide mononitrate (IMDUR) 30 MG 24 hr tablet Take 0.5 tablets (15 mg total) by mouth daily.  . Lancets (ACCU-CHEK MULTICLIX) lancets Use as instructed three times daily dx 250.01  . levothyroxine (SYNTHROID) 200 MCG tablet  Take 1 tablet (200 mcg total) by mouth daily before breakfast.  . loratadine (CLARITIN) 10 MG tablet Take 10 mg by mouth daily as needed for allergies.   . Menthol, Topical Analgesic, (ICY HOT EX) Apply 1 application topically 3 (three) times daily as needed (for back pain.).  Marland Kitchen metFORMIN (GLUCOPHAGE) 1000 MG tablet TAKE 1 TABLET TWICE A DAY  . metoprolol tartrate (LOPRESSOR) 50 MG tablet TAKE 1 TABLET(50 MG) BY MOUTH TWICE DAILY  . Multiple Vitamin (MULTIVITAMIN) tablet Take 1 tablet by mouth daily.  Marland Kitchen PARoxetine (PAXIL) 20 MG tablet Take 1 tablet (20 mg  total) by mouth daily.  . pravastatin (PRAVACHOL) 80 MG tablet Take 1 tablet (80 mg total) by mouth daily. (Patient taking differently: Take 80 mg by mouth at bedtime. )  . quinapril (ACCUPRIL) 10 MG tablet TAKE 1 TABLET DAILY  . verapamil (CALAN-SR) 180 MG CR tablet Take 1 tablet (180 mg total) by mouth at bedtime.  . vitamin B-12 (CYANOCOBALAMIN) 1000 MCG tablet Take 1 tablet (1,000 mcg total) by mouth daily.  . pioglitazone (ACTOS) 30 MG tablet Take 1 tablet by mouth daily.  . Vitamin D, Ergocalciferol, (DRISDOL) 50000 units CAPS capsule TAKE 1 CAPSULE BY MOUTH EVERY 7 DAYS (Patient not taking: Reported on 05/27/2017)   No facility-administered encounter medications on file as of 05/27/2017.     Allergies  Allergen Reactions  . Daypro [Oxaprozin] Hives  . Sulfonamide Derivatives Hives    Review of Systems  Constitutional: Negative for activity change, appetite change and unexpected weight change.  Cardiovascular: Negative for chest pain, palpitations and leg swelling.  Gastrointestinal: Negative for constipation, diarrhea and vomiting.  Genitourinary: Positive for flank pain. Negative for difficulty urinating.  Musculoskeletal: Negative for arthralgias, back pain and gait problem.  Skin: Negative for color change and rash.  Psychiatric/Behavioral: Positive for sleep disturbance. The patient is nervous/anxious.   All other systems reviewed and are negative.   BP 124/70 (BP Location: Right Arm, Patient Position: Sitting, Cuff Size: Normal)   Pulse 77   Temp 97.9 F (36.6 C) (Temporal)   Ht 5\' 5"  (1.651 m)   Wt 202 lb 8 oz (91.9 kg)   LMP 11/11/2016   SpO2 97%   BMI 33.70 kg/m   Physical Exam  Constitutional: She is oriented to person, place, and time. She appears well-developed and well-nourished. No distress.  Well-groomed. No distress. Appears concerned.  HENT:  Head: Normocephalic and atraumatic.  Right Ear: External ear normal.  Left Ear: External ear normal.  Nose:  Nose normal.  Mouth/Throat: Oropharynx is clear and moist.  No face asymmetry  Eyes: EOM are normal. Pupils are equal, round, and reactive to light.  Neck: Normal range of motion. Neck supple. No thyromegaly present.  Cardiovascular: Normal rate, regular rhythm and normal heart sounds.  Pulmonary/Chest: Effort normal and breath sounds normal. No respiratory distress.  Musculoskeletal: Normal range of motion. She exhibits no edema.  Strength, sensation, range of motion, reflexes  intact in all extremities.  No pain or limitation of the back.  Joints of lower extremities move well.  Skin is examined it is clear.  No tenderness to deep palpation of calf.  Straight leg raise on the left causes mild reproduction of pain  Lymphadenopathy:    She has no cervical adenopathy.  Neurological: She is alert and oriented to person, place, and time. She displays normal reflexes. No cranial nerve deficit.  Psychiatric: She has a normal mood and affect. Her behavior is normal.  Mild anxiety    ASSESSMENT/PLAN:  1. Leg pain, lateral, left I explained to the patient with her known lumbar degenerative disc disease and neuroforaminal stenosis, some abnormal sensation in her leg is not unusual.  I told her it is a form of neuralgia.  I do not think there is anything serious going on.  She should increase her Neurontin.  She will let us know if this does not help   Patient Instructions  Increase the gabapentin to 200 to 300 at bedtime Call Dr Moshe Cipro if this does not help     Raylene Everts, MD

## 2017-06-06 ENCOUNTER — Other Ambulatory Visit: Payer: Self-pay | Admitting: Cardiovascular Disease

## 2017-06-07 DIAGNOSIS — M2040 Other hammer toe(s) (acquired), unspecified foot: Secondary | ICD-10-CM | POA: Diagnosis not present

## 2017-06-07 DIAGNOSIS — L851 Acquired keratosis [keratoderma] palmaris et plantaris: Secondary | ICD-10-CM | POA: Diagnosis not present

## 2017-06-07 DIAGNOSIS — E1142 Type 2 diabetes mellitus with diabetic polyneuropathy: Secondary | ICD-10-CM | POA: Diagnosis not present

## 2017-06-09 DIAGNOSIS — E7849 Other hyperlipidemia: Secondary | ICD-10-CM | POA: Diagnosis not present

## 2017-06-09 DIAGNOSIS — E559 Vitamin D deficiency, unspecified: Secondary | ICD-10-CM | POA: Diagnosis not present

## 2017-06-09 DIAGNOSIS — I10 Essential (primary) hypertension: Secondary | ICD-10-CM | POA: Diagnosis not present

## 2017-06-10 LAB — COMPLETE METABOLIC PANEL WITH GFR
AG Ratio: 1.3 (calc) (ref 1.0–2.5)
ALT: 15 U/L (ref 6–29)
AST: 15 U/L (ref 10–35)
Albumin: 4 g/dL (ref 3.6–5.1)
Alkaline phosphatase (APISO): 44 U/L (ref 33–130)
BUN: 10 mg/dL (ref 7–25)
CO2: 32 mmol/L (ref 20–32)
Calcium: 8.8 mg/dL (ref 8.6–10.4)
Chloride: 103 mmol/L (ref 98–110)
Creat: 0.61 mg/dL (ref 0.60–0.93)
GFR, Est African American: 105 mL/min/{1.73_m2} (ref >=60)
GFR, Est Non African American: 91 mL/min/{1.73_m2} (ref >=60)
Globulin: 3.1 g/dL (calc) (ref 1.9–3.7)
Glucose, Bld: 177 mg/dL — ABNORMAL HIGH (ref 65–99)
Potassium: 4.1 mmol/L (ref 3.5–5.3)
Sodium: 140 mmol/L (ref 135–146)
Total Bilirubin: 0.3 mg/dL (ref 0.2–1.2)
Total Protein: 7.1 g/dL (ref 6.1–8.1)

## 2017-06-10 LAB — LIPID PANEL
Cholesterol: 191 mg/dL (ref <200)
HDL: 45 mg/dL — ABNORMAL LOW (ref >50)
LDL Cholesterol (Calc): 120 mg/dL (calc) — ABNORMAL HIGH
Non-HDL Cholesterol (Calc): 146 mg/dL (calc) — ABNORMAL HIGH (ref <130)
Total CHOL/HDL Ratio: 4.2 (calc) (ref <5.0)
Triglycerides: 150 mg/dL — ABNORMAL HIGH (ref <150)

## 2017-06-10 LAB — VITAMIN D 25 HYDROXY (VIT D DEFICIENCY, FRACTURES): Vit D, 25-Hydroxy: 17 ng/mL — ABNORMAL LOW (ref 30–100)

## 2017-06-13 ENCOUNTER — Telehealth: Payer: Self-pay | Admitting: Family Medicine

## 2017-06-13 ENCOUNTER — Encounter: Payer: Self-pay | Admitting: Family Medicine

## 2017-06-13 ENCOUNTER — Ambulatory Visit (INDEPENDENT_AMBULATORY_CARE_PROVIDER_SITE_OTHER): Payer: Medicare Other | Admitting: Family Medicine

## 2017-06-13 VITALS — BP 132/80 | HR 73 | Resp 16 | Ht 65.0 in | Wt 206.0 lb

## 2017-06-13 DIAGNOSIS — F419 Anxiety disorder, unspecified: Secondary | ICD-10-CM

## 2017-06-13 DIAGNOSIS — I10 Essential (primary) hypertension: Secondary | ICD-10-CM

## 2017-06-13 DIAGNOSIS — E669 Obesity, unspecified: Secondary | ICD-10-CM

## 2017-06-13 DIAGNOSIS — F329 Major depressive disorder, single episode, unspecified: Secondary | ICD-10-CM | POA: Diagnosis not present

## 2017-06-13 DIAGNOSIS — E1169 Type 2 diabetes mellitus with other specified complication: Secondary | ICD-10-CM

## 2017-06-13 DIAGNOSIS — M4722 Other spondylosis with radiculopathy, cervical region: Secondary | ICD-10-CM | POA: Diagnosis not present

## 2017-06-13 DIAGNOSIS — E782 Mixed hyperlipidemia: Secondary | ICD-10-CM | POA: Diagnosis not present

## 2017-06-13 DIAGNOSIS — E785 Hyperlipidemia, unspecified: Secondary | ICD-10-CM

## 2017-06-13 DIAGNOSIS — F32A Depression, unspecified: Secondary | ICD-10-CM

## 2017-06-13 NOTE — Progress Notes (Signed)
Jennifer Blackburn     MRN: [id number]      DOB: Mar 01, 1946   HPI Jennifer Blackburn is here for follow up and re-evaluation of chronic medical conditions, medication management and review of any available recent lab and radiology data.  Preventive health is updated, specifically  Cancer screening and Immunization.   Questions or concerns regarding consultations or procedures which the PT has had in the interim are  addressed. The PT denies any adverse reactions to current medications since the last visit.  6 month h/o right posterior neck pain extending to posterior head awakens pt , rated at an 8, awakens her , to posterior ear, now down arm  Pain in arm to fingers awakens her also tingling , no weakness noted Foot and ankle swelling mild x 2 3 months since isordil, no new ankle pain, , no heart  Failure symptoms, I assure here that indeed this may be a s/e  of the medication Contemplating changing to a new endo,and will call with the name Denies polyuria, polydipsia, blurred vision , or hypoglycemic episodes. HBA1C is 7.8 in 03/2017 which is an improvement  ROS Denies recent fever or chills. Denies sinus pressure, nasal congestion, ear pain or sore throat. Denies chest congestion, productive cough or wheezing. Denies chest pains, palpitations and leg swelling Denies abdominal pain, nausea, vomiting,diarrhea or constipation.   Denies dysuria, frequency, hesitancy or incontinence. . Denies  Uncontrolled depression, anxiety or insomnia. Denies skin break down or rash.   PE  BP 132/80   Pulse 73   Resp 16   Ht 5\' 5"  (1.651 m)   Wt 206 lb (93.4 kg)   LMP 11/11/2016   SpO2 96%   BMI 34.28 kg/m   Patient alert and oriented and in no cardiopulmonary distress.  HEENT: No facial asymmetry, EOMI,   oropharynx pink and moist.  Neck decreased ROM with spasmno JVD, no mass.  Chest: Clear to auscultation bilaterally.  CVS: S1, S2 no murmurs, no S3.Regular rate.  ABD: Soft non tender.    Ext: No edema  MS: Adequate though reduced  RO lumbar spine,adequate in shoulders, hips and knees.  Skin: Intact, no ulcerations or rash noted.  Psych: Good eye contact, normal affect. Memory intact not anxious or depressed appearing.  CNS: CN 2-12 intact, power,  normal throughout.decrased sensation in RUE  Assessment & Plan  Cervical spondylosis with radiculopathy 6 month h/o progressively worsening neck pain radiating down RUE to hand with numbness and tingling rated at an 8 to 10 and disturbing sleep established disease, since 2011, needs re imaging and re eval by neurosurgery  Essential hypertension Controlled, no change in medication DASH diet and commitment to daily physical activity for a minimum of 30 minutes discussed and encouraged, as a part of hypertension management. The importance of attaining a healthy weight is also discussed.  BP/Weight 06/13/2017 05/27/2017 04/05/2017 03/29/2017 02/23/2017 02/17/2017 78/29/5621  Systolic BP 308 657 846 962 - 952 841  Diastolic BP 80 70 64 74 - 74 70  Wt. (Lbs) 206 202.5 203.6 212 203 203 201  BMI 34.28 33.7 33.88 35.28 33.78 33.78 33.45       Hyperlipidemia Hyperlipidemia:Low fat diet discussed and encouraged.   Lipid Panel  Lab Results  Component Value Date   CHOL 191 06/09/2017   HDL 45 (L) 06/09/2017   LDLCALC 120 (H) 06/09/2017   TRIG 150 (H) 06/09/2017   CHOLHDL 4.2 06/09/2017  uncontrolled and not at goal, needs to lower intake of  fried and fatty foods     Diabetes mellitus type 2 in obese [facility]) Jennifer Blackburn is reminded of the importance of commitment to daily physical activity for 30 minutes or more, as able and the need to limit carbohydrate intake to 30 to 60 grams per meal to help with blood sugar control.   The need to take medication as prescribed, test blood sugar as directed, and to call between visits if there is a concern that blood sugar is uncontrolled is also discussed.   Jennifer Blackburn is reminded of  the importance of daily foot exam, annual eye examination, and good blood sugar, blood pressure and cholesterol control. Improved, HBA1C is 7.8 in 03/2017  Diabetic Labs Latest Ref Rng & Units 06/14/2017 06/09/2017 07/13/2016 03/23/2016 10/20/2015  HbA1c - - - - - 8.0  Microalbumin Not Estab. ug/mL 19.5(H) - - - -  Micro/Creat Ratio 0.0 - 30.0 mg/g creat 11.5 - - - -  Chol <200 mg/dL - 191 172 - -  HDL >50 mg/dL - 45(L) 42(L) - -  Calc LDL mg/dL (calc) - 120(H) 100(H) - 132  Triglycerides <150 mg/dL - 150(H) 148 - -  Creatinine 0.60 - 0.93 mg/dL - 0.61 0.69 0.78 -   BP/Weight 06/13/2017 05/27/2017 04/05/2017 03/29/2017 02/23/2017 02/17/2017 56/43/3295  Systolic BP 188 416 606 301 - 601 093  Diastolic BP 80 70 64 74 - 74 70  Wt. (Lbs) 206 202.5 203.6 212 203 203 201  BMI 34.28 33.7 33.88 35.28 33.78 33.78 33.45   Foot/eye exam completion dates Latest Ref Rng & Units 12/26/2015 10/15/2015  Eye Exam No Retinopathy Retinopathy(A) -  Foot exam Order - - -  Foot Form Completion - - Done         Anxiety and depression Controlled, no change in medication

## 2017-06-13 NOTE — Patient Instructions (Addendum)
F/u in with MD early September , call if you need me before  Microalb today from office  You will be referred for an MRI of your neck, and then to DR Vertell Limber for review   Please call and leave name of endo with front desk staff , I will refer you  Call Cardiology for medication advice  BP , kidney, liver are excellent  Please cut backl on nuts, and fat  Fasting lipid cmp and EGFR1 week before visit  All the best

## 2017-06-13 NOTE — Telephone Encounter (Signed)
Has appt today. Will do required foot exam and address then.

## 2017-06-13 NOTE — Telephone Encounter (Signed)
Pt is calling in wanting to know if she can get diabetic shoes, please advise

## 2017-06-14 ENCOUNTER — Other Ambulatory Visit (HOSPITAL_COMMUNITY)
Admission: RE | Admit: 2017-06-14 | Discharge: 2017-06-14 | Disposition: A | Discharge status: Home / Self Care | Payer: Medicare Other | Source: Other Acute Inpatient Hospital | Attending: Family Medicine | Admitting: Family Medicine

## 2017-06-14 DIAGNOSIS — E669 Obesity, unspecified: Secondary | ICD-10-CM | POA: Insufficient documentation

## 2017-06-14 DIAGNOSIS — E1169 Type 2 diabetes mellitus with other specified complication: Secondary | ICD-10-CM | POA: Insufficient documentation

## 2017-06-15 LAB — MICROALBUMIN / CREATININE URINE RATIO
Creatinine, Urine: 170.1 mg/dL
Microalb Creat Ratio: 11.5 mg/g creat (ref 0.0–30.0)
Microalb, Ur: 19.5 ug/mL — ABNORMAL HIGH

## 2017-06-17 ENCOUNTER — Other Ambulatory Visit: Payer: Self-pay

## 2017-06-17 NOTE — Patient Outreach (Signed)
[facility]) Care Management  [place]  06/17/2017   Jennifer Blackburn 05-16-1945 [id number]  Subjective: Telephone call placed to the patient for monthly assessment.  HIPAA verified.  The patient states that she has been doing ok.  She states that she has been having some back and leg pain that she rates a 8/10.  She denies having any falls. The patient has been adherent with her medication and she does her chores around the home.  The patient checked her blood sugar yesterday and it was 165.  She states that it has been a little high lately.  She was taken off of Actos because of swelling and the swelling has stopped. The patient states that she has been drinking sweet tea and some grape juice.  RN Health Coach talked with the patient about her food and fluid intake.  She verbalized understanding.  The patient saw her primary physician on 3/25 and has an appointment with her endocrinologist in two weeks.    Encounter Medications:  Outpatient Encounter Medications as of 06/17/2017  Medication Sig Note  . aspirin EC 81 MG tablet Take 1 tablet (81 mg total) by mouth daily.   . ferrous sulfate 325 (65 FE) MG tablet Take 325 mg by mouth daily with breakfast.   . fluticasone (FLONASE) 50 MCG/ACT nasal spray Place 2 sprays into both nostrils daily. (Patient taking differently: Place 2 sprays into both nostrils daily as needed for allergies. )   . gabapentin (NEURONTIN) 100 MG capsule TAKE 1 CAPSULE(100 MG) BY MOUTH THREE TIMES DAILY (Patient taking differently: Take 100 mg by mouth at bedtime. ) 06/17/2017: Patient states once a day   . glipiZIDE (GLUCOTROL XL) 5 MG 24 hr tablet Take 1 tablet (5 mg total) daily with breakfast by mouth.   Marland Kitchen glucose blood (ACCU-CHEK AVIVA PLUS) test strip Use as instructed four times daily dx E11.65   . imipramine (TOFRANIL) 25 MG tablet TAKE 4 TABLETS BY MOUTH EVERY NIGHT AT BEDTIME (Patient taking differently: Take 50 mg by mouth at bedtime. )    . Insulin Glargine (TOUJEO SOLOSTAR [place]) Inject 30 Units into the skin 2 (two) times daily.   . isosorbide mononitrate (IMDUR) 30 MG 24 hr tablet Take 0.5 tablets (15 mg total) by mouth daily.   . Lancets (ACCU-CHEK MULTICLIX) lancets Use as instructed three times daily dx 250.01   . levothyroxine (SYNTHROID) 200 MCG tablet Take 1 tablet (200 mcg total) by mouth daily before breakfast.   . loratadine (CLARITIN) 10 MG tablet Take 10 mg by mouth daily as needed for allergies.    . Menthol, Topical Analgesic, (ICY HOT EX) Apply 1 application topically 3 (three) times daily as needed (for back pain.).   Marland Kitchen metFORMIN (GLUCOPHAGE) 1000 MG tablet TAKE 1 TABLET TWICE A DAY   . metoprolol tartrate (LOPRESSOR) 50 MG tablet TAKE 1 TABLET(50 MG) BY MOUTH TWICE DAILY   . Multiple Vitamin (MULTIVITAMIN) tablet Take 1 tablet by mouth daily.   Marland Kitchen PARoxetine (PAXIL) 20 MG tablet Take 1 tablet (20 mg total) by mouth daily.   . pravastatin (PRAVACHOL) 80 MG tablet Take 1 tablet (80 mg total) by mouth daily. (Patient taking differently: Take 80 mg by mouth at bedtime. )   . quinapril (ACCUPRIL) 10 MG tablet TAKE 1 TABLET DAILY   . verapamil (CALAN-SR) 180 MG CR tablet TAKE 1 TABLET(180 MG) BY MOUTH AT BEDTIME   . vitamin B-12 (CYANOCOBALAMIN) 1000 MCG tablet Take 1 tablet (1,000  mcg total) by mouth daily.   . Vitamin D, Ergocalciferol, (DRISDOL) 50000 units CAPS capsule TAKE 1 CAPSULE BY MOUTH EVERY 7 DAYS   . pioglitazone (ACTOS) 30 MG tablet Take 1 tablet by mouth daily.    No facility-administered encounter medications on file as of 06/17/2017.     Functional Status:  In your present state of health, do you have any difficulty performing the following activities: 01/03/2017 12/09/2016  Hearing? N Y  Comment - patient stated soemtimes she has problems hearing  Vision? N N  Difficulty concentrating or making decisions? N N  Walking or climbing stairs? N N  Dressing or bathing? N N  Doing errands, shopping? N N   Preparing Food and eating ? N Y  Using the Toilet? N Y  In the past six months, have you accidently leaked urine? N N  Do you have problems with loss of bowel control? N N  Managing your Medications? N Y  Managing your Finances? N Y  Housekeeping or managing your Housekeeping? N Y  Some recent data might be hidden    Fall/Depression Screening: Fall Risk  06/17/2017 05/10/2017 02/08/2017  Falls in the past year? No No No  Number falls in past yr: - - -  Injury with Fall? - - -  Comment - - -  Risk Factor Category  - - -  Risk for fall due to : - - -  Risk for fall due to: Comment - - -  Follow up - - -  Comment - - -   PHQ 2/9 Scores 01/05/2017 01/03/2017 12/09/2016 12/08/2016 07/14/2016 03/23/2016 02/11/2015  PHQ - 2 Score 0 0 0 0 0 0 0  PHQ- 9 Score - 0 - - - - -    Assessment: Patient continues to benefit from health coach outreach for disease management and support.   THN CM Care Plan Problem One     Most Recent Value  THN Long Term Goal   Patient will work on lowering her a1c by 0,2 point in 90 days  [place] Term Goal Start Date  06/17/17  Interventions for Problem One Long Term Goal  [facility] talked with the patient about her food nd fluid intake to lower her blood sugars  THN CM Short Term Goal #1   patient will check her blood sugars daily for the next 30 days  THN CM Short Term Goal #1 Start Date  06/17/17  [facility] CM Short Term Goal #1 Met Date  06/17/17  Interventions for Short Term Goal #1  Patient states tht she is cheking her blood sugars daily  THN CM Short Term Goal #2   Patient will work on her diet to lose two pounds in 30 days   THN CM Short Term Goal #2 Start Date  06/17/17  [facility] CM Short Term Goal #2 Met Date  06/17/17  Interventions for Short Term Goal #2  [facility] talked with the patient and she has went fro 212 lbs in January to 206 lbs on 3/25     Plan: Fellows will contact patient in the month of April and patient agrees to next outreach.  Lazaro Arms RN,  BSN, [place] Direct Dial:  [phone]  Fax: [phone]

## 2017-06-20 ENCOUNTER — Encounter: Payer: Self-pay | Admitting: Family Medicine

## 2017-06-20 DIAGNOSIS — M4722 Other spondylosis with radiculopathy, cervical region: Secondary | ICD-10-CM | POA: Insufficient documentation

## 2017-06-20 NOTE — Assessment & Plan Note (Signed)
Hyperlipidemia:Low fat diet discussed and encouraged.   Lipid Panel  Lab Results  Component Value Date   CHOL 191 06/09/2017   HDL 45 (L) 06/09/2017   LDLCALC 120 (H) 06/09/2017   TRIG 150 (H) 06/09/2017   CHOLHDL 4.2 06/09/2017  uncontrolled and not at goal, needs to lower intake of fried and fatty foods

## 2017-06-20 NOTE — Assessment & Plan Note (Signed)
Controlled, no change in medication DASH diet and commitment to daily physical activity for a minimum of 30 minutes discussed and encouraged, as a part of hypertension management. The importance of attaining a healthy weight is also discussed.  BP/Weight 06/13/2017 05/27/2017 04/05/2017 03/29/2017 02/23/2017 02/17/2017 35/57/3220  Systolic BP 254 270 623 762 - 831 517  Diastolic BP 80 70 64 74 - 74 70  Wt. (Lbs) 206 202.5 203.6 212 203 203 201  BMI 34.28 33.7 33.88 35.28 33.78 33.78 33.45

## 2017-06-20 NOTE — Assessment & Plan Note (Addendum)
Jennifer Blackburn is reminded of the importance of commitment to daily physical activity for 30 minutes or more, as able and the need to limit carbohydrate intake to 30 to 60 grams per meal to help with blood sugar control.   The need to take medication as prescribed, test blood sugar as directed, and to call between visits if there is a concern that blood sugar is uncontrolled is also discussed.   Jennifer Blackburn is reminded of the importance of daily foot exam, annual eye examination, and good blood sugar, blood pressure and cholesterol control. Improved, HBA1C is 7.8 in 03/2017  Diabetic Labs Latest Ref Rng & Units 06/14/2017 06/09/2017 07/13/2016 03/23/2016 10/20/2015  HbA1c - - - - - 8.0  Microalbumin Not Estab. ug/mL 19.5(H) - - - -  Micro/Creat Ratio 0.0 - 30.0 mg/g creat 11.5 - - - -  Chol <200 mg/dL - 191 172 - -  HDL >50 mg/dL - 45(L) 42(L) - -  Calc LDL mg/dL (calc) - 120(H) 100(H) - 132  Triglycerides <150 mg/dL - 150(H) 148 - -  Creatinine 0.60 - 0.93 mg/dL - 0.61 0.69 0.78 -   BP/Weight 06/13/2017 05/27/2017 04/05/2017 03/29/2017 02/23/2017 02/17/2017 32/04/3341  Systolic BP 568 616 837 290 - 211 155  Diastolic BP 80 70 64 74 - 74 70  Wt. (Lbs) 206 202.5 203.6 212 203 203 201  BMI 34.28 33.7 33.88 35.28 33.78 33.78 33.45   Foot/eye exam completion dates Latest Ref Rng & Units 12/26/2015 10/15/2015  Eye Exam No Retinopathy Retinopathy(A) -  Foot exam Order - - -  Foot Form Completion - - Done

## 2017-06-20 NOTE — Assessment & Plan Note (Signed)
6 month h/o progressively worsening neck pain radiating down RUE to hand with numbness and tingling rated at an 8 to 10 and disturbing sleep established disease, since 2011, needs re imaging and re eval by neurosurgery

## 2017-06-20 NOTE — Assessment & Plan Note (Signed)
Controlled, no change in medication  

## 2017-06-22 ENCOUNTER — Other Ambulatory Visit: Payer: Self-pay | Admitting: Family Medicine

## 2017-06-22 DIAGNOSIS — M4722 Other spondylosis with radiculopathy, cervical region: Secondary | ICD-10-CM

## 2017-06-22 NOTE — Progress Notes (Signed)
amb neurosurg 

## 2017-06-27 ENCOUNTER — Ambulatory Visit (HOSPITAL_COMMUNITY)
Admission: RE | Admit: 2017-06-27 | Discharge: 2017-06-27 | Disposition: A | Discharge status: Home / Self Care | Payer: Medicare Other | Source: Ambulatory Visit | Attending: Family Medicine | Admitting: Family Medicine

## 2017-06-27 DIAGNOSIS — Z9889 Other specified postprocedural states: Secondary | ICD-10-CM | POA: Insufficient documentation

## 2017-06-27 DIAGNOSIS — M542 Cervicalgia: Secondary | ICD-10-CM | POA: Diagnosis not present

## 2017-06-27 DIAGNOSIS — M4322 Fusion of spine, cervical region: Secondary | ICD-10-CM | POA: Diagnosis not present

## 2017-06-27 DIAGNOSIS — M4802 Spinal stenosis, cervical region: Secondary | ICD-10-CM | POA: Insufficient documentation

## 2017-06-27 DIAGNOSIS — M50221 Other cervical disc displacement at C4-C5 level: Secondary | ICD-10-CM | POA: Diagnosis not present

## 2017-06-27 DIAGNOSIS — M4722 Other spondylosis with radiculopathy, cervical region: Secondary | ICD-10-CM | POA: Diagnosis not present

## 2017-07-09 ENCOUNTER — Other Ambulatory Visit: Payer: Self-pay | Admitting: Family Medicine

## 2017-07-11 DIAGNOSIS — E7801 Familial hypercholesterolemia: Secondary | ICD-10-CM | POA: Diagnosis not present

## 2017-07-11 DIAGNOSIS — E1165 Type 2 diabetes mellitus with hyperglycemia: Secondary | ICD-10-CM | POA: Diagnosis not present

## 2017-07-14 ENCOUNTER — Other Ambulatory Visit: Payer: Self-pay

## 2017-07-14 NOTE — Patient Outreach (Signed)
[facility]) Care Management  07/14/2017  Jennifer Blackburn 25-Aug-1945 [id number]     Telephone call to the patient for monthly assessment. Husband answered the phone and stated that the patient was still asleep.  HIPAA compliant message left.  Plan: RN Health Coach will make an outreach attempt to the patient within four business days.  Lazaro Arms RN, BSN, [place] Direct Dial:  [phone]  Fax: [phone]

## 2017-07-14 NOTE — Patient Outreach (Signed)
[facility]) Care Management  [place]  07/14/2017   Jennifer Blackburn [birth year] [id number]  Subjective: Telephone call received from the patient.  HIPAA verified.  The patient states that she is doing ok.  She denies any pain of falls.  The patient states that she is adherent with her medications.  She states that her blood sugars have been ranging between 169-180 in the morning fasting.  [facility] Health Coach discussed diet with the patient and she states that she is trying to eat more baked meats decreasing her bread intake  and putting more vegetables in her diet.  She states that she is doing mild exercise by walking but she has back pain.  The patient states that she has an appointment with her endocrinologist in the morning.   Encounter Medications:  Outpatient Encounter Medications as of 07/14/2017  Medication Sig Note  . aspirin EC 81 MG tablet Take 1 tablet (81 mg total) by mouth daily.   . ferrous sulfate 325 (65 FE) MG tablet Take 325 mg by mouth daily with breakfast.   . fluticasone (FLONASE) 50 MCG/ACT nasal spray Place 2 sprays into both nostrils daily. (Patient taking differently: Place 2 sprays into both nostrils daily as needed for allergies. )   . gabapentin (NEURONTIN) 100 MG capsule TAKE 1 CAPSULE(100 MG) BY MOUTH THREE TIMES DAILY (Patient taking differently: Take 100 mg by mouth at bedtime. ) 06/17/2017: Patient states once a day   . glipiZIDE (GLUCOTROL XL) 5 MG 24 hr tablet Take 1 tablet (5 mg total) daily with breakfast by mouth.   Marland Kitchen glucose blood (ACCU-CHEK AVIVA PLUS) test strip Use as instructed four times daily dx E11.65   . imipramine (TOFRANIL) 25 MG tablet TAKE 4 TABLETS BY MOUTH EVERY NIGHT AT BEDTIME (Patient taking differently: Take 50 mg by mouth at bedtime. )   . Insulin Glargine (TOUJEO SOLOSTAR [place]) Inject 30 Units into the skin 2 (two) times daily.   . isosorbide mononitrate (IMDUR) 30 MG 24 hr tablet Take 0.5 tablets (15 mg total) by  mouth daily.   . Lancets (ACCU-CHEK MULTICLIX) lancets Use as instructed three times daily dx 250.01   . levothyroxine (SYNTHROID) 200 MCG tablet Take 1 tablet (200 mcg total) by mouth daily before breakfast.   . loratadine (CLARITIN) 10 MG tablet Take 10 mg by mouth daily as needed for allergies.    . Menthol, Topical Analgesic, (ICY HOT EX) Apply 1 application topically 3 (three) times daily as needed (for back pain.).   Marland Kitchen metFORMIN (GLUCOPHAGE) 1000 MG tablet TAKE 1 TABLET TWICE A DAY   . metoprolol tartrate (LOPRESSOR) 50 MG tablet TAKE 1 TABLET(50 MG) BY MOUTH TWICE DAILY   . Multiple Vitamin (MULTIVITAMIN) tablet Take 1 tablet by mouth daily.   Marland Kitchen PARoxetine (PAXIL) 20 MG tablet Take 1 tablet (20 mg total) by mouth daily.   . pravastatin (PRAVACHOL) 80 MG tablet Take 1 tablet (80 mg total) by mouth daily. (Patient taking differently: Take 80 mg by mouth at bedtime. )   . quinapril (ACCUPRIL) 10 MG tablet TAKE 1 TABLET DAILY   . verapamil (CALAN-SR) 180 MG CR tablet TAKE 1 TABLET(180 MG) BY MOUTH AT BEDTIME   . vitamin B-12 (CYANOCOBALAMIN) 1000 MCG tablet Take 1 tablet (1,000 mcg total) by mouth daily.   . Vitamin D, Ergocalciferol, (DRISDOL) 50000 units CAPS capsule TAKE 1 CAPSULE BY MOUTH EVERY 7 DAYS   . imipramine (TOFRANIL) 25 MG tablet TAKE 4 TABLETS BY  MOUTH EVERY NIGHT AT BEDTIME   . pioglitazone (ACTOS) 30 MG tablet Take 1 tablet by mouth daily.    No facility-administered encounter medications on file as of 07/14/2017.     Functional Status:  In your present state of health, do you have any difficulty performing the following activities: 01/03/2017 12/09/2016  Hearing? N Y  Comment - patient stated soemtimes she has problems hearing  Vision? N N  Difficulty concentrating or making decisions? N N  Walking or climbing stairs? N N  Dressing or bathing? N N  Doing errands, shopping? N N  Preparing Food and eating ? N Y  Using the Toilet? N Y  In the past six months, have you  accidently leaked urine? N N  Do you have problems with loss of bowel control? N N  Managing your Medications? N Y  Managing your Finances? N Y  Housekeeping or managing your Housekeeping? N Y  Some recent data might be hidden    Fall/Depression Screening: Fall Risk  07/14/2017 06/17/2017 05/10/2017  Falls in the past year? No No No  Number falls in past yr: - - -  Injury with Fall? - - -  Comment - - -  Risk Factor Category  - - -  Risk for fall due to : - - -  Risk for fall due to: Comment - - -  Follow up - - -  Comment - - -   PHQ 2/9 Scores 01/05/2017 01/03/2017 12/09/2016 12/08/2016 07/14/2016 03/23/2016 02/11/2015  PHQ - 2 Score 0 0 0 0 0 0 0  PHQ- 9 Score - 0 - - - - -    Assessment: Patient continues to benefit from health coach outreach for disease management and support.  THN CM Care Plan Problem One     Most Recent Value  THN Long Term Goal   Patient will work on lowering her a1c  8.3 by 0,2 point in 90 days  [place] Term Goal Start Date  07/14/17  Interventions for Problem One Long Term Goal  [facility] discussed her food intake and adherence t medications     Plan: [place] will contact patient in the month of May and patient agrees to next outreach.  Lazaro Arms RN, BSN, [place] Direct Dial:  [phone]  Fax: [phone]

## 2017-07-15 DIAGNOSIS — E1165 Type 2 diabetes mellitus with hyperglycemia: Secondary | ICD-10-CM | POA: Diagnosis not present

## 2017-07-15 DIAGNOSIS — Z6833 Body mass index (BMI) 33.0-33.9, adult: Secondary | ICD-10-CM | POA: Diagnosis not present

## 2017-07-15 DIAGNOSIS — E1159 Type 2 diabetes mellitus with other circulatory complications: Secondary | ICD-10-CM | POA: Diagnosis not present

## 2017-07-15 DIAGNOSIS — E785 Hyperlipidemia, unspecified: Secondary | ICD-10-CM | POA: Diagnosis not present

## 2017-07-15 DIAGNOSIS — E118 Type 2 diabetes mellitus with unspecified complications: Secondary | ICD-10-CM | POA: Diagnosis not present

## 2017-07-15 DIAGNOSIS — E669 Obesity, unspecified: Secondary | ICD-10-CM | POA: Diagnosis not present

## 2017-07-15 DIAGNOSIS — E7801 Familial hypercholesterolemia: Secondary | ICD-10-CM | POA: Diagnosis not present

## 2017-07-15 DIAGNOSIS — E039 Hypothyroidism, unspecified: Secondary | ICD-10-CM | POA: Diagnosis not present

## 2017-07-15 DIAGNOSIS — C73 Malignant neoplasm of thyroid gland: Secondary | ICD-10-CM | POA: Diagnosis not present

## 2017-07-15 DIAGNOSIS — E6609 Other obesity due to excess calories: Secondary | ICD-10-CM | POA: Diagnosis not present

## 2017-07-15 DIAGNOSIS — D509 Iron deficiency anemia, unspecified: Secondary | ICD-10-CM | POA: Diagnosis not present

## 2017-07-15 DIAGNOSIS — I10 Essential (primary) hypertension: Secondary | ICD-10-CM | POA: Diagnosis not present

## 2017-08-05 ENCOUNTER — Other Ambulatory Visit: Payer: Self-pay | Admitting: *Deleted

## 2017-08-05 DIAGNOSIS — E1165 Type 2 diabetes mellitus with hyperglycemia: Secondary | ICD-10-CM | POA: Diagnosis not present

## 2017-08-05 NOTE — Patient Outreach (Signed)
[place] [facility]) Care Management  08/05/2017  Jennifer Blackburn [birth year] [id number]   Outreach Attempt:  Received notification from [place] patient calling in with general question.  Successful telephone outreach to patient.  HIPAA verified with patient.  Patient has concerns and questions about starting new diabetes medications.  Patient reports she is now own Metformin, Glipizide, Jardiance, Toujeo, and Victoza for her diabetes.  States she is "wondering if this is too much medication".  RN Health Coach inquired if patient discussed all medications and her concerns with her Endocrinologist, and she stated no.  Patient also concerned she will not be able to afford all the medications in the next month and fells she will be in the donut hole sooner this year than last year with the addition of the new medications.  Encouraged patient to discuss medications and concerns with her Endocrinologist.  Discussed with patient [facility] Pharmacist referral for medication education, medication reconciliation, and medication assistance.  Patient verbally agrees to [facility] Pharmacist referral.  Plan:  [place] will make [facility] Pharmacist  referral for medication education, medication reconciliation, and medication assistance.    [place] [phone] [email or web address]

## 2017-08-06 LAB — HEMOGLOBIN A1C: Hemoglobin A1C: 8.9

## 2017-08-08 ENCOUNTER — Other Ambulatory Visit: Payer: Self-pay

## 2017-08-08 NOTE — Patient Outreach (Signed)
[place] [facility]) Care Management  08/08/2017  IVALEE STRAUSER 05-02-1945 [id number]  72 year old female referred to [place] services for poly pharmacy med review and medication assistance.   PMHx includes, but not limited to, hypertension, coronary atherosclerosis, Type II diabetes mellitus, GERD, h/o thyroid cancer,osteoarthritis and  hyperlipidemia.  Unsuccessful outreach attempt #1 to Ms. Borland.  Left HIPAA compliant voice message requesting return call.  Plan: Outreach attempt #2 on Thursday 5/23.  Will send unsuccessful outreach attempt letter Ms. Bertoli.  Joetta Manners, PharmD Clinical Pharmacist [place] [phone]

## 2017-08-09 ENCOUNTER — Other Ambulatory Visit: Payer: Self-pay

## 2017-08-09 NOTE — Patient Outreach (Signed)
[place] [facility]) Care Management  08/09/2017  OLUWATOMISIN HUSTEAD 1945/08/27 [id number]   72 year old female referred to [place] services for poly pharmacy med review and medication assistance.   PMHx includes, but not limited to, hypertension, coronary atherosclerosis, Type II diabetes mellitus, GERD, h/o thyroid cancer,osteoarthritis and  hyperlipidemia.  Incoming call received after 1700 on 08/08/17.  HIPAA identifiers verified.    Subjective: Patient states that she feels she is taking too many diabetes medications.  She reports take Toujeo,  Jardiance, Victoza, glipizide and metformin. She reports that she sees an endocrinogist, Dr. Ronnald Collum in [place].  She is worried that she will go into the donut hole soon if she stays on all of her expensive medications.  I made an appointment to call the patient back on 5/21 after 11:00 to discuss her diabetes medications and patient assistance.  Patients states she would be available the rest of the afternoon, however, I was unable to make contact with her despite several attempts.   Plan: Outreach phone call Thursday.  Joetta Manners, PharmD Clinical Pharmacist [place] [phone]

## 2017-08-10 ENCOUNTER — Other Ambulatory Visit: Payer: Self-pay

## 2017-08-10 DIAGNOSIS — M19019 Primary osteoarthritis, unspecified shoulder: Secondary | ICD-10-CM | POA: Diagnosis not present

## 2017-08-10 DIAGNOSIS — M542 Cervicalgia: Secondary | ICD-10-CM | POA: Diagnosis not present

## 2017-08-10 DIAGNOSIS — M5412 Radiculopathy, cervical region: Secondary | ICD-10-CM | POA: Diagnosis not present

## 2017-08-10 NOTE — Patient Outreach (Signed)
[place] [facility]) Care Management  [place]   08/10/2017  Jennifer Blackburn [birth year] [id number]   21year oldfemalereferred to [facility] services for poly pharmacy med review and medication assistance.PMHx includes, but not limited to, hypertension, coronary atherosclerosis, Type II diabetes mellitus, GERD, h/o thyroid cancer,osteoarthritis and hyperlipidemia.  Subjective: Jennifer Blackburn reports that she is worried that she is on too many diabetes medications and that she will go into the donut hole soon.  She reports that she is prescribed Toujeo, Jardiance, Victoza, metformin and glipizide by her endocrinologist.  Patient admits that she never got the Jardiance filled while doing her medication history.  She states that she saw how much the Victoza cost and decided she would not fill the Jardiance because it was very expensive too.  She states that her last HgA1c was > 9%, but that she had it rechecked last Friday in preparation for an appointment with her endocrinologist on 5/23.  She states that she checks her CBGs daily and that it runs between 135-145.  Patient states she has Therapist, occupational for insurance.   Objective:  KPN down for maintenance cannot see recent HgA1c values.   Current Medications: Current Outpatient Medications  Medication Sig Dispense Refill  . aspirin EC 81 MG tablet Take 1 tablet (81 mg total) by mouth daily.    . ferrous sulfate 325 (65 FE) MG tablet Take 325 mg by mouth daily with breakfast.    . fluticasone (FLONASE) 50 MCG/ACT nasal spray Place 2 sprays into both nostrils daily. (Patient taking differently: Place 2 sprays into both nostrils daily as needed for allergies. ) 16 g 6  . gabapentin (NEURONTIN) 100 MG capsule TAKE 1 CAPSULE(100 MG) BY MOUTH THREE TIMES DAILY (Patient taking differently: Take 100 mg by mouth at bedtime. ) 270 capsule 1  . glipiZIDE (GLUCOTROL XL) 5 MG 24 hr tablet Take 1 tablet (5 mg total) daily with  breakfast by mouth. 30 tablet 3  . glucose blood (ACCU-CHEK AVIVA PLUS) test strip Use as instructed four times daily dx E11.65 150 each 5  . imipramine (TOFRANIL) 25 MG tablet TAKE 4 TABLETS BY MOUTH EVERY NIGHT AT BEDTIME (Patient taking differently: Take 1-2 mg by mouth at bedtime. ) 360 tablet 1  . Insulin Glargine (TOUJEO SOLOSTAR [place]) Inject 30 Units into the skin 2 (two) times daily.    . isosorbide mononitrate (IMDUR) 30 MG 24 hr tablet Take 0.5 tablets (15 mg total) by mouth daily. 45 tablet 1  . Lancets (ACCU-CHEK MULTICLIX) lancets Use as instructed three times daily dx 250.01 100 each 5  . levothyroxine (SYNTHROID) 200 MCG tablet Take 1 tablet (200 mcg total) by mouth daily before breakfast. 30 tablet 5  . liraglutide (VICTOZA) 18 MG/3ML SOPN Inject 1.8 mg into the skin daily before supper.    . loratadine (CLARITIN) 10 MG tablet Take 10 mg by mouth daily as needed for allergies.     . Menthol, Topical Analgesic, (ICY HOT EX) Apply 1 application topically 3 (three) times daily as needed (for back pain.).    Marland Kitchen metFORMIN (GLUCOPHAGE) 1000 MG tablet TAKE 1 TABLET TWICE A DAY 180 tablet 1  . metoprolol tartrate (LOPRESSOR) 50 MG tablet TAKE 1 TABLET(50 MG) BY MOUTH TWICE DAILY 60 tablet 11  . Multiple Vitamin (MULTIVITAMIN) tablet Take 1 tablet by mouth daily.    Marland Kitchen PARoxetine (PAXIL) 20 MG tablet Take 1 tablet (20 mg total) by mouth daily. (Patient taking differently: Take 20 mg by  mouth daily. Patient reports that she takes it 1-2 times week.) 90 tablet 3  . pravastatin (PRAVACHOL) 80 MG tablet Take 1 tablet (80 mg total) by mouth daily. (Patient taking differently: Take 80 mg by mouth at bedtime. ) 90 tablet 1  . quinapril (ACCUPRIL) 10 MG tablet TAKE 1 TABLET DAILY 90 tablet 1  . verapamil (CALAN-SR) 180 MG CR tablet TAKE 1 TABLET(180 MG) BY MOUTH AT BEDTIME 30 tablet 11  . vitamin B-12 (CYANOCOBALAMIN) 1000 MCG tablet Take 1 tablet (1,000 mcg total) by mouth daily. 30 tablet 5  . VITAMIN  D, CHOLECALCIFEROL, PO Take 1 capsule by mouth daily.    . Empagliflozin (JARDIANCE PO) Take by mouth. Was prescribed, but patient never filled.  She does not know the script     No current facility-administered medications for this visit.     Functional Status: In your present state of health, do you have any difficulty performing the following activities: 01/03/2017 12/09/2016  Hearing? N Y  Comment - patient stated soemtimes she has problems hearing  Vision? N N  Difficulty concentrating or making decisions? N N  Walking or climbing stairs? N N  Dressing or bathing? N N  Doing errands, shopping? N N  Preparing Food and eating ? N Y  Using the Toilet? N Y  In the past six months, have you accidently leaked urine? N N  Do you have problems with loss of bowel control? N N  Managing your Medications? N Y  Managing your Finances? N Y  Housekeeping or managing your Housekeeping? N Y  Some recent data might be hidden    Fall/Depression Screening: Fall Risk  07/14/2017 06/17/2017 05/10/2017  Falls in the past year? No No No  Number falls in past yr: - - -  Injury with Fall? - - -  Comment - - -  Risk Factor Category  - - -  Risk for fall due to : - - -  Risk for fall due to: Comment - - -  Follow up - - -  Comment - - -   PHQ 2/9 Scores 01/05/2017 01/03/2017 12/09/2016 12/08/2016 07/14/2016 03/23/2016 02/11/2015  PHQ - 2 Score 0 0 0 0 0 0 0  PHQ- 9 Score - 0 - - - - -    Medication Assistance: [place] informed patient that we could look at medication assistance progam eligibility after her doctor's appointment tomorrow. He may want to change her regimen.  Per financial discussion she is not eligible for Extra Help LIS.  Based on her co-pays it is unlikely that she has met OOP requirements in order to apply for patient assistance.  Jennifer Blackburn will call [employer] to determine her OOP for 2019.  Toujeo ([employer]) requires 5% OOP Jardiance Horticulturist, commercial) requires $1100 OOP Victoza [facility]) requires $1000 OOP  I asked patient for a good time to call her after her endocrinologist's visit and she states that she will call me back.   Plan: Outreach attempt in 3-4 business days.   Joetta Manners, PharmD Clinical Pharmacist [place] [phone]

## 2017-08-11 DIAGNOSIS — E118 Type 2 diabetes mellitus with unspecified complications: Secondary | ICD-10-CM | POA: Diagnosis not present

## 2017-08-11 DIAGNOSIS — E785 Hyperlipidemia, unspecified: Secondary | ICD-10-CM | POA: Diagnosis not present

## 2017-08-11 DIAGNOSIS — E1159 Type 2 diabetes mellitus with other circulatory complications: Secondary | ICD-10-CM | POA: Diagnosis not present

## 2017-08-11 DIAGNOSIS — E039 Hypothyroidism, unspecified: Secondary | ICD-10-CM | POA: Diagnosis not present

## 2017-08-11 DIAGNOSIS — C73 Malignant neoplasm of thyroid gland: Secondary | ICD-10-CM | POA: Diagnosis not present

## 2017-08-11 DIAGNOSIS — E6609 Other obesity due to excess calories: Secondary | ICD-10-CM | POA: Diagnosis not present

## 2017-08-11 DIAGNOSIS — E1165 Type 2 diabetes mellitus with hyperglycemia: Secondary | ICD-10-CM | POA: Diagnosis not present

## 2017-08-11 DIAGNOSIS — I10 Essential (primary) hypertension: Secondary | ICD-10-CM | POA: Diagnosis not present

## 2017-08-11 DIAGNOSIS — D509 Iron deficiency anemia, unspecified: Secondary | ICD-10-CM | POA: Diagnosis not present

## 2017-08-15 ENCOUNTER — Ambulatory Visit: Payer: Self-pay

## 2017-08-17 ENCOUNTER — Ambulatory Visit: Payer: Self-pay

## 2017-08-17 ENCOUNTER — Other Ambulatory Visit: Payer: Self-pay

## 2017-08-17 NOTE — Patient Outreach (Signed)
[place] [facility]) Care Management  08/17/2017  Jennifer Blackburn 1946/03/10 [id number]  33year oldfemalereferred to [place] services for poly pharmacy med review and medication assistance.PMHx includes, but not limited to, hypertension, coronary atherosclerosis, Type II diabetes mellitus, GERD, h/o thyroid cancer,osteoarthritis and hyperlipidemia.  Successful outreach attempt to Ms. Bollman.  HIPAA identifiers verified.   Subjective: Patient reports that she saw her endocrinologist last week and that her HgA1c was 9%, which is down slightly from "nine point something."  She reports that she is still trying to eat better and that she has lost 6 lbs.  She reports that her endocrinologist increased her Toujeo to 40 units twice daily.  She reports that she told him she never picked up her Jardiance and he said it was there if she decided that she wanted to get it filled.  Patient reports that the doctor's office gave her some forms to fill out for patient assistance that would not be from a drug company, but that she hasn't looked at the paperwork because she has been too busy.  She states that she thinks her OOP is $600 and that she is about $900 from going into the donut hole.   She states that she will look for her forms from the doctor and from [place] and call me back later today.    Medication Assistance: Per previous financial discussion, patient does not qualify for patient assistance at this point.  She has not spent enough OOP on medications .  I reached out to patient this afternoon to finish conversation, but she did not answer.   Left HIPAA compliant message requesting return call.   Plan: Outreach to patient Friday 5/31.   Joetta Manners, PharmD Clinical Pharmacist [place] [phone]

## 2017-08-18 ENCOUNTER — Ambulatory Visit: Payer: Self-pay

## 2017-08-19 ENCOUNTER — Ambulatory Visit: Payer: Self-pay

## 2017-08-19 ENCOUNTER — Other Ambulatory Visit: Payer: Self-pay

## 2017-08-19 NOTE — Patient Outreach (Signed)
[place] [facility]) Care Management  08/19/2017  LEIGHLA CHESTNUTT 28-Nov-1945 [id number]  46year oldfemalereferred to [place] services for poly pharmacy med review and medication assistance.PMHx includes, but not limited to, hypertension, coronary atherosclerosis, Type II diabetes mellitus, GERD, h/o thyroid cancer,osteoarthritis and hyperlipidemia.  Successful outreach attempt to Ms. Minich.  HIPAA identifiers verified.   Medication Assistance: Patient states that her insurance reports her OOP is $395 for this year.  She believes she is about $900 from the donut hole.  She does not qualify for Extra Help LIS.  Her medications aren't available through the [facility].  At this time, she will not qualify for patient assistance.  She must spend the following amounts:  Toujeo ([employer]ofi) requires 5% OOP Jardiance Horticulturist, commercial) requires $1100 OOP Victoza [employer]) requires $1000 OOP  Patient verbalizes understanding and states that she will call me once she has met the required OOP amounts.  She reports that she will ask her physician for samples because they gave her some last year.   Informed patient that I will close her [place] case at this time.    Plan: I will notify [facility] RNs, Lazaro Arms and Hubert Azure of case closure.    Joetta Manners, PharmD [place] [phone]

## 2017-08-23 ENCOUNTER — Other Ambulatory Visit: Payer: Self-pay

## 2017-08-23 NOTE — Patient Outreach (Signed)
[place] [facility]) Care Management  08/23/2017   Jennifer Blackburn 22-Sep-1945 [id number]  Subjective: Telephone call  to the patient for monthly assessment. HIPAA verified. The patient states that she is doing well.  She denies any pain or falls.  Her blood sugar this morning was 136.  She is eating less but admits she still needs to work on the type of foods she eats.  I advised her on how diet affects her diabetes and readings.  She verbalized understanding.   The patient states that she is taking her medications but having a hard time paying for them.  She has been talking with our pharmacist Joetta Manners, Pharm D here at [facility].  She states that she is walking daily as much as she can with her back arm and leg pain.  The patient has an appointment with Ortho on Friday for her issues.  She has an appointment with her primary care on Monday 08/29/2017.  Current Medications:  Current Outpatient Medications  Medication Sig Dispense Refill  . aspirin EC 81 MG tablet Take 1 tablet (81 mg total) by mouth daily.    . Empagliflozin (JARDIANCE PO) Take by mouth. Was prescribed, but patient never filled.  She does not know the script    . ferrous sulfate 325 (65 FE) MG tablet Take 325 mg by mouth daily with breakfast.    . fluticasone (FLONASE) 50 MCG/ACT nasal spray Place 2 sprays into both nostrils daily. (Patient taking differently: Place 2 sprays into both nostrils daily as needed for allergies. ) 16 g 6  . gabapentin (NEURONTIN) 100 MG capsule TAKE 1 CAPSULE(100 MG) BY MOUTH THREE TIMES DAILY (Patient taking differently: Take 100 mg by mouth at bedtime. ) 270 capsule 1  . glipiZIDE (GLUCOTROL XL) 5 MG 24 hr tablet Take 1 tablet (5 mg total) daily with breakfast by mouth. 30 tablet 3  . glucose blood (ACCU-CHEK AVIVA PLUS) test strip Use as instructed four times daily dx E11.65 150 each 5  . imipramine (TOFRANIL) 25 MG tablet TAKE 4 TABLETS BY MOUTH EVERY NIGHT AT BEDTIME (Patient taking  differently: Take 1-2 mg by mouth at bedtime. ) 360 tablet 1  . Insulin Glargine (TOUJEO SOLOSTAR ) Inject 40 Units into the skin 2 (two) times daily.     . Lancets (ACCU-CHEK MULTICLIX) lancets Use as instructed three times daily dx 250.01 100 each 5  . levothyroxine (SYNTHROID) 200 MCG tablet Take 1 tablet (200 mcg total) by mouth daily before breakfast. 30 tablet 5  . liraglutide (VICTOZA) 18 MG/3ML SOPN Inject 1.8 mg into the skin daily before supper.    . loratadine (CLARITIN) 10 MG tablet Take 10 mg by mouth daily as needed for allergies.     . Menthol, Topical Analgesic, (ICY HOT EX) Apply 1 application topically 3 (three) times daily as needed (for back pain.).    Marland Kitchen metFORMIN (GLUCOPHAGE) 1000 MG tablet TAKE 1 TABLET TWICE A DAY 180 tablet 1  . metoprolol tartrate (LOPRESSOR) 50 MG tablet TAKE 1 TABLET(50 MG) BY MOUTH TWICE DAILY 60 tablet 11  . Multiple Vitamin (MULTIVITAMIN) tablet Take 1 tablet by mouth daily.    Marland Kitchen PARoxetine (PAXIL) 20 MG tablet Take 1 tablet (20 mg total) by mouth daily. (Patient taking differently: Take 20 mg by mouth daily. Patient reports that she takes it 1-2 times week.) 90 tablet 3  . pravastatin (PRAVACHOL) 80 MG tablet Take 1 tablet (80 mg total) by mouth daily. (Patient taking differently: Take  80 mg by mouth at bedtime. ) 90 tablet 1  . quinapril (ACCUPRIL) 10 MG tablet TAKE 1 TABLET DAILY 90 tablet 1  . verapamil (CALAN-SR) 180 MG CR tablet TAKE 1 TABLET(180 MG) BY MOUTH AT BEDTIME 30 tablet 11  . vitamin B-12 (CYANOCOBALAMIN) 1000 MCG tablet Take 1 tablet (1,000 mcg total) by mouth daily. 30 tablet 5  . VITAMIN D, CHOLECALCIFEROL, PO Take 1 capsule by mouth daily.    . isosorbide mononitrate (IMDUR) 30 MG 24 hr tablet Take 0.5 tablets (15 mg total) by mouth daily. 45 tablet 1   No current facility-administered medications for this visit.     Functional Status:  In your present state of health, do you have any difficulty performing the following  activities: 01/03/2017 12/09/2016  Hearing? N Y  Comment - patient stated soemtimes she has problems hearing  Vision? N N  Difficulty concentrating or making decisions? N N  Walking or climbing stairs? N N  Dressing or bathing? N N  Doing errands, shopping? N N  Preparing Food and eating ? N Y  Using the Toilet? N Y  In the past six months, have you accidently leaked urine? N N  Do you have problems with loss of bowel control? N N  Managing your Medications? N Y  Managing your Finances? N Y  Housekeeping or managing your Housekeeping? N Y  Some recent data might be hidden    Fall/Depression Screening: Fall Risk  08/23/2017 07/14/2017 06/17/2017  Falls in the past year? No No No  Number falls in past yr: - - -  Injury with Fall? - - -  Comment - - -  Risk Factor Category  - - -  Risk for fall due to : - - -  Risk for fall due to: Comment - - -  Follow up - - -  Comment - - -   PHQ 2/9 Scores 01/05/2017 01/03/2017 12/09/2016 12/08/2016 07/14/2016 03/23/2016 02/11/2015  PHQ - 2 Score 0 0 0 0 0 0 0  PHQ- 9 Score - 0 - - - - -    Assessment: Patient will continue to benefit from health coach outreach for disease management and support.  THN CM Care Plan Problem One     Most Recent Value  THN Long Term Goal   Patient will work on lowering her a1c  8.3 by 0,2 point in 90 days  [place] Term Goal Start Date  08/23/17  Interventions for Problem One Long Term Goal  discussed with the pateint about her diet and how it can affect her blood sugar levels     Plan: [place] will contact patient in the month of July and patient agrees to next outreach.  Lazaro Arms RN, BSN, [place] Direct Dial:  [phone]  Fax: [phone]

## 2017-08-26 DIAGNOSIS — M25511 Pain in right shoulder: Secondary | ICD-10-CM | POA: Diagnosis not present

## 2017-08-29 ENCOUNTER — Ambulatory Visit (INDEPENDENT_AMBULATORY_CARE_PROVIDER_SITE_OTHER): Payer: Medicare Other | Admitting: Family Medicine

## 2017-08-29 ENCOUNTER — Encounter: Payer: Self-pay | Admitting: Family Medicine

## 2017-08-29 VITALS — BP 140/72 | HR 83 | Resp 16 | Ht 65.0 in | Wt 191.0 lb

## 2017-08-29 DIAGNOSIS — E1159 Type 2 diabetes mellitus with other circulatory complications: Secondary | ICD-10-CM | POA: Diagnosis not present

## 2017-08-29 DIAGNOSIS — E669 Obesity, unspecified: Secondary | ICD-10-CM | POA: Diagnosis not present

## 2017-08-29 DIAGNOSIS — I10 Essential (primary) hypertension: Secondary | ICD-10-CM | POA: Diagnosis not present

## 2017-08-29 DIAGNOSIS — E1169 Type 2 diabetes mellitus with other specified complication: Secondary | ICD-10-CM

## 2017-08-29 DIAGNOSIS — Z1231 Encounter for screening mammogram for malignant neoplasm of breast: Secondary | ICD-10-CM | POA: Diagnosis not present

## 2017-08-29 DIAGNOSIS — C73 Malignant neoplasm of thyroid gland: Secondary | ICD-10-CM | POA: Diagnosis not present

## 2017-08-29 DIAGNOSIS — E890 Postprocedural hypothyroidism: Secondary | ICD-10-CM

## 2017-08-29 DIAGNOSIS — E559 Vitamin D deficiency, unspecified: Secondary | ICD-10-CM

## 2017-08-29 DIAGNOSIS — E785 Hyperlipidemia, unspecified: Secondary | ICD-10-CM

## 2017-08-29 NOTE — Patient Instructions (Addendum)
F/U in 3 to 3.5   months, call if you need me before  Pls sched mammogram 8/24 or after at [facility] at checkout  Please DO leave the name of the endo you wish to start seeing here today  We will refer you to diabetic educator in [place]   Pls call Dr Moryati in the interim for concern re blood sugars or s/e of meds , the vision changes should be getting better or gone  It is important that you exercise regularly at least 30 minutes 7 times a week. If you develop chest pain, have severe difficulty breathing, or feel very tired, stop exercising immediately and seek medical attention   Need to reduce fat in diet  Fasting lipid, cmp and EGFr 1 week before next visit       

## 2017-09-02 ENCOUNTER — Telehealth: Payer: Self-pay | Admitting: Family Medicine

## 2017-09-02 NOTE — Telephone Encounter (Signed)
Patient called in to request a referral to Dr.Jeffrey [facility] in [place] for Endocrinology [phone])

## 2017-09-04 ENCOUNTER — Encounter: Payer: Self-pay | Admitting: Family Medicine

## 2017-09-04 NOTE — Assessment & Plan Note (Signed)
Elevated at visit No med change as generally controlled DASH diet and commitment to daily physical activity for a minimum of 30 minutes discussed and encouraged, as a part of hypertension management. The importance of attaining a healthy weight is also discussed.  BP/Weight 08/29/2017 06/13/2017 05/27/2017 04/05/2017 03/29/2017 02/23/2017 70/17/7939  Systolic BP 030 092 330 076 226 - 333  Diastolic BP 72 80 70 64 74 - 74  Wt. (Lbs) 191 206 202.5 203.6 212 203 203  BMI 31.78 34.28 33.7 33.88 35.28 33.78 33.78

## 2017-09-04 NOTE — Assessment & Plan Note (Signed)
Pt reports ongoing difficulty in management and deterioration in her control, has finally decided that she wants to change to a new endocrinologist , will call back with the name of the specific MD, in the interim, she had questions about number of medications taking and dosing , I advised her to continue to get directions from Dr Carmela Rima due to the complexity of her regime and the fact that the parting was not adverserial Jennifer Blackburn is reminded of the importance of commitment to daily physical activity for 30 minutes or more, as able and the need to limit carbohydrate intake to 30 to 60 grams per meal to help with blood sugar control.   The need to take medication as prescribed, test blood sugar as directed, and to call between visits if there is a concern that blood sugar is uncontrolled is also discussed.   Jennifer Blackburn is reminded of the importance of daily foot exam, annual eye examination, and good blood sugar, blood pressure and cholesterol control.  Diabetic Labs Latest Ref Rng & Units 08/06/2017 06/14/2017 06/09/2017 07/13/2016 03/23/2016  HbA1c - 8.9 - - - -  Microalbumin Not Estab. ug/mL - 19.5(H) - - -  Micro/Creat Ratio 0.0 - 30.0 mg/g creat - 11.5 - - -  Chol <200 mg/dL - - 191 172 -  HDL >50 mg/dL - - 45(L) 42(L) -  Calc LDL mg/dL (calc) - - 120(H) 100(H) -  Triglycerides <150 mg/dL - - 150(H) 148 -  Creatinine 0.60 - 0.93 mg/dL - - 0.61 0.69 0.78   BP/Weight 08/29/2017 06/13/2017 05/27/2017 04/05/2017 03/29/2017 02/23/2017 45/85/9292  Systolic BP 446 286 381 771 165 - 790  Diastolic BP 72 80 70 64 74 - 74  Wt. (Lbs) 191 206 202.5 203.6 212 203 203  BMI 31.78 34.28 33.7 33.88 35.28 33.78 33.78   Foot/eye exam completion dates Latest Ref Rng & Units 12/26/2015 10/15/2015  Eye Exam No Retinopathy Retinopathy(A) -  Foot exam Order - - -  Foot Form Completion - - Done

## 2017-09-04 NOTE — Progress Notes (Signed)
Jennifer Blackburn     MRN: [id number]      DOB: [birth year]   HPI Jennifer Blackburn is here for follow up and re-evaluation of chronic medical conditions, medication management and review of any available recent lab and radiology data.  Her main reason for the visit is her ongoing concern about her uncontrolled diabetes and the cost of her medication as well as the increasing number of medications she is needing to control her diabetes. States she is now at the point where she has decided on changing her Endocrinologist and will call back with the name of the Doc she wishes to see, whom she has already identified Recently had intra articular injection in the right shoulder , more painful than she expected, hopeful for improved symptoms in the near future .     ROS Denies recent fever or chills. Denies sinus pressure, nasal congestion, ear pain or sore throat. Denies chest congestion, productive cough or wheezing. Denies chest pains, palpitations and leg swelling Denies abdominal pain, nausea, vomiting,diarrhea or constipation.   y. Denies headaches, seizures, numbness, or tingling. Denies depression, anxiety or insomnia. Denies skin break down or rash.   PE  BP 140/72   Pulse 83   Resp 16   Ht 5\' 5"  (1.651 m)   Wt 191 lb (86.6 kg)   LMP 11/11/2016   SpO2 98%   BMI 31.78 kg/m   Patient alert and oriented and in no cardiopulmonary distress.  HEENT: No facial asymmetry, EOMI,   oropharynx pink and moist.  Neck decreased ROM no JVD, no mass.  Chest: Clear to auscultation bilaterally.  CVS: S1, S2 no murmurs, no S3.Regular rate.  ABD: Soft non tender.   Ext: No edema  MS: decreased ROM spine, shoulder adequate in hips and knees  Skin: Intact, no ulcerations or rash noted.  Psych: Good eye contact, normal affect. Memory intact not anxious or depressed appearing.  CNS: CN 2-12 intact, power,  normal throughout.no focal deficits noted.   Assessment & Plan  Type 2 diabetes  mellitus with vascular disease ([place]) Pt reports ongoing difficulty in management and deterioration in her control, has finally decided that she wants to change to a new endocrinologist , will call back with the name of the specific MD, in the interim, she had questions about number of medications taking and dosing , I advised her to continue to get directions from Dr Carmela Rima due to the complexity of her regime and the fact that the parting was not adverserial Jennifer Blackburn is reminded of the importance of commitment to daily physical activity for 30 minutes or more, as able and the need to limit carbohydrate intake to 30 to 60 grams per meal to help with blood sugar control.   The need to take medication as prescribed, test blood sugar as directed, and to call between visits if there is a concern that blood sugar is uncontrolled is also discussed.   Jennifer Blackburn is reminded of the importance of daily foot exam, annual eye examination, and good blood sugar, blood pressure and cholesterol control.  Diabetic Labs Latest Ref Rng & Units 08/06/2017 06/14/2017 06/09/2017 07/13/2016 03/23/2016  HbA1c - 8.9 - - - -  Microalbumin Not Estab. ug/mL - 19.5(H) - - -  Micro/Creat Ratio 0.0 - 30.0 mg/g creat - 11.5 - - -  Chol <200 mg/dL - - 191 172 -  HDL >50 mg/dL - - 45(L) 42(L) -  Calc LDL mg/dL (calc) - - 120(H) 100(H) -  Triglycerides <150 mg/dL - - 150(H) 148 -  Creatinine 0.60 - 0.93 mg/dL - - 0.61 0.69 0.78   BP/Weight 08/29/2017 06/13/2017 05/27/2017 04/05/2017 03/29/2017 02/23/2017 56/25/6389  Systolic BP 373 428 768 115 726 - 203  Diastolic BP 72 80 70 64 74 - 74  Wt. (Lbs) 191 206 202.5 203.6 212 203 203  BMI 31.78 34.28 33.7 33.88 35.28 33.78 33.78   Foot/eye exam completion dates Latest Ref Rng & Units 12/26/2015 10/15/2015  Eye Exam No Retinopathy Retinopathy(A) -  Foot exam Order - - -  Foot Form Completion - - Done        Thyroid cancer ([place]) H/o thyroid cancer diagnosed in 2000, requiring post  surgical replacement , manged by endocrine will refer to her new Provider for this also  Postsurgical hypothyroidism Managed by endocrinology, following removal of thyroid gland in 2000 because of thyroid cancer  Obesity with body mass index greater than 30 Improved Patient re-educated about  the importance of commitment to a  minimum of 150 minutes of exercise per week.  The importance of healthy food choices with portion control discussed. Encouraged to start a food diary, count calories and to consider  joining a support group. Sample diet sheets offered. Goals set by the patient for the next several months.   Weight /BMI 08/29/2017 06/13/2017 05/27/2017  WEIGHT 191 lb 206 lb 202 lb 8 oz  HEIGHT 5\' 5"  5\' 5"  5\' 5"   BMI 31.78 kg/m2 34.28 kg/m2 33.7 kg/m2      Hypertension Elevated at visit,   Essential hypertension Elevated at visit No med change as generally controlled DASH diet and commitment to daily physical activity for a minimum of 30 minutes discussed and encouraged, as a part of hypertension management. The importance of attaining a healthy weight is also discussed.  BP/Weight 08/29/2017 06/13/2017 05/27/2017 04/05/2017 03/29/2017 02/23/2017 55/97/4163  Systolic BP 845 364 680 321 224 - 825  Diastolic BP 72 80 70 64 74 - 74  Wt. (Lbs) 191 206 202.5 203.6 212 203 203  BMI 31.78 34.28 33.7 33.88 35.28 33.78 33.78

## 2017-09-04 NOTE — Assessment & Plan Note (Signed)
Improved Patient re-educated about  the importance of commitment to a  minimum of 150 minutes of exercise per week.  The importance of healthy food choices with portion control discussed. Encouraged to start a food diary, count calories and to consider  joining a support group. Sample diet sheets offered. Goals set by the patient for the next several months.   Weight /BMI 08/29/2017 06/13/2017 05/27/2017  WEIGHT 191 lb 206 lb 202 lb 8 oz  HEIGHT 5\' 5"  5\' 5"  5\' 5"   BMI 31.78 kg/m2 34.28 kg/m2 33.7 kg/m2

## 2017-09-04 NOTE — Assessment & Plan Note (Signed)
Managed by endocrinology, following removal of thyroid gland in 2000 because of thyroid cancer

## 2017-09-04 NOTE — Assessment & Plan Note (Signed)
H/o thyroid cancer diagnosed in 2000, requiring post surgical replacement , manged by endocrine will refer to her new Provider for this also

## 2017-09-04 NOTE — Assessment & Plan Note (Signed)
Elevated at visit,

## 2017-09-05 NOTE — Telephone Encounter (Signed)
Done

## 2017-09-09 DIAGNOSIS — E1142 Type 2 diabetes mellitus with diabetic polyneuropathy: Secondary | ICD-10-CM | POA: Diagnosis not present

## 2017-09-17 ENCOUNTER — Other Ambulatory Visit: Payer: Self-pay | Admitting: Family Medicine

## 2017-09-26 DIAGNOSIS — M7501 Adhesive capsulitis of right shoulder: Secondary | ICD-10-CM | POA: Diagnosis not present

## 2017-09-28 ENCOUNTER — Other Ambulatory Visit: Payer: Self-pay | Admitting: Cardiology

## 2017-09-30 ENCOUNTER — Other Ambulatory Visit: Payer: Self-pay

## 2017-09-30 NOTE — Patient Outreach (Addendum)
[facility] [facility]) Care Management  [place]  09/30/2017   Jennifer Blackburn December 09, 1945 [id number]  Subjective: Successful call to the patient for assessment.  HIPAA verified.  The patient states that she is doing ok.  Her blood sugars having been ranging from 130 to 170.  This morning it was 165. She denies any pain or falls.  She states that she started Victoza and was feeling sick with nausea and stopped taking it.  I explained to the patient that nauseas is one of the side effect and to discuss it with her physician.  She states that she has an appointment with Dr Moshe Cipro next Tuesday.  She is adherent with all of other medications.  She will be going into the doughnut hole with her Toujeo. She knows her limit that she will need to spend to be eligible for assistance. The patient has also stopped taking her Jardiance due to cost. I discussed with the patient about making a referral to pharmacy and she agreed.  The patient has an appointment scheduled to see a nutritionist in August.   Encounter Medications:  Outpatient Encounter Medications as of 09/30/2017  Medication Sig Note  . aspirin EC 81 MG tablet Take 1 tablet (81 mg total) by mouth daily.   . ferrous sulfate 325 (65 FE) MG tablet Take 325 mg by mouth daily with breakfast.   . fluticasone (FLONASE) 50 MCG/ACT nasal spray Place 2 sprays into both nostrils daily. (Patient taking differently: Place 2 sprays into both nostrils daily as needed for allergies. )   . gabapentin (NEURONTIN) 100 MG capsule TAKE 1 CAPSULE(100 MG) BY MOUTH THREE TIMES DAILY (Patient taking differently: Take 100 mg by mouth at bedtime. ) 06/17/2017: Patient states once a day   . glipiZIDE (GLUCOTROL XL) 5 MG 24 hr tablet Take 1 tablet (5 mg total) daily with breakfast by mouth.   Marland Kitchen glucose blood (ACCU-CHEK AVIVA PLUS) test strip Use as instructed four times daily dx E11.65   . imipramine (TOFRANIL) 25 MG tablet TAKE 4 TABLETS BY MOUTH EVERY NIGHT  AT BEDTIME (Patient taking differently: Take 1-2 mg by mouth at bedtime. )   . Insulin Glargine (TOUJEO SOLOSTAR [place]) Inject 40 Units into the skin 2 (two) times daily.    . isosorbide mononitrate (IMDUR) 30 MG 24 hr tablet TAKE 1/2 TABLET(15 MG) BY MOUTH DAILY   . Lancets (ACCU-CHEK MULTICLIX) lancets Use as instructed three times daily dx 250.01   . levothyroxine (SYNTHROID) 200 MCG tablet Take 1 tablet (200 mcg total) by mouth daily before breakfast.   . liraglutide (VICTOZA) 18 MG/3ML SOPN Inject 1.8 mg into the skin daily before supper.   . loratadine (CLARITIN) 10 MG tablet Take 10 mg by mouth daily as needed for allergies.    . Menthol, Topical Analgesic, (ICY HOT EX) Apply 1 application topically 3 (three) times daily as needed (for back pain.).   Marland Kitchen metFORMIN (GLUCOPHAGE) 1000 MG tablet TAKE 1 TABLET TWICE A DAY   . metoprolol tartrate (LOPRESSOR) 50 MG tablet TAKE 1 TABLET(50 MG) BY MOUTH TWICE DAILY   . Multiple Vitamin (MULTIVITAMIN) tablet Take 1 tablet by mouth daily.   Marland Kitchen PARoxetine (PAXIL) 20 MG tablet Take 1 tablet (20 mg total) by mouth daily. (Patient taking differently: Take 20 mg by mouth daily. Patient reports that she takes it 1-2 times week.)   . pravastatin (PRAVACHOL) 80 MG tablet TAKE 1 TABLET DAILY   . quinapril (ACCUPRIL) 10 MG tablet TAKE  1 TABLET DAILY   . verapamil (CALAN-SR) 180 MG CR tablet TAKE 1 TABLET(180 MG) BY MOUTH AT BEDTIME   . vitamin B-12 (CYANOCOBALAMIN) 1000 MCG tablet Take 1 tablet (1,000 mcg total) by mouth daily.   Marland Kitchen VITAMIN D, CHOLECALCIFEROL, PO Take 1 capsule by mouth daily.   . Empagliflozin (JARDIANCE PO) Take by mouth. Was prescribed, but patient never filled.  She does not know the script    No facility-administered encounter medications on file as of 09/30/2017.     Functional Status:  In your present state of health, do you have any difficulty performing the following activities: 01/03/2017 12/09/2016  Hearing? N Y  Comment - patient  stated soemtimes she has problems hearing  Vision? N N  Difficulty concentrating or making decisions? N N  Walking or climbing stairs? N N  Dressing or bathing? N N  Doing errands, shopping? N N  Preparing Food and eating ? N Y  Using the Toilet? N Y  In the past six months, have you accidently leaked urine? N N  Do you have problems with loss of bowel control? N N  Managing your Medications? N Y  Managing your Finances? N Y  Housekeeping or managing your Housekeeping? N Y  Some recent data might be hidden    Fall/Depression Screening: Fall Risk  09/30/2017 08/29/2017 08/23/2017  Falls in the past year? No No No  Number falls in past yr: - - -  Injury with Fall? - - -  Comment - - -  Risk Factor Category  - - -  Risk for fall due to : - - -  Risk for fall due to: Comment - - -  Follow up - - -  Comment - - -   PHQ 2/9 Scores 08/29/2017 01/05/2017 01/03/2017 12/09/2016 12/08/2016 07/14/2016 03/23/2016  PHQ - 2 Score 0 0 0 0 0 0 0  PHQ- 9 Score - - 0 - - - -    Assessment: Patient will continue to benefit from health coach outreach for disease management and support.  THN CM Care Plan Problem One     Most Recent Value  THN Long Term Goal   Patient will work on lowering her a1c  8.9 by 0,2 point in 90 days  THN Long Term Goal Start Date  09/30/17  Interventions for Problem One Long Term Goal  discussed medication adherence and diet     Plan: [place] will contact patient in the month of August  and patient agrees to next outreach. RN Health Coach will send referral to pharmacy for medication assistance.  Lazaro Arms RN, BSN, [place] Direct Dial:  [phone]  Fax: [phone]

## 2017-09-30 NOTE — Addendum Note (Signed)
Addended by: Waldon Reining on: 09/30/2017 12:55 PM   Modules accepted: Orders

## 2017-10-04 ENCOUNTER — Encounter: Payer: Self-pay | Admitting: Family Medicine

## 2017-10-04 ENCOUNTER — Ambulatory Visit (INDEPENDENT_AMBULATORY_CARE_PROVIDER_SITE_OTHER): Payer: Medicare Other | Admitting: Family Medicine

## 2017-10-04 VITALS — BP 110/70 | HR 68 | Resp 12 | Ht 65.0 in | Wt 192.8 lb

## 2017-10-04 DIAGNOSIS — R600 Localized edema: Secondary | ICD-10-CM | POA: Diagnosis not present

## 2017-10-04 DIAGNOSIS — F329 Major depressive disorder, single episode, unspecified: Secondary | ICD-10-CM

## 2017-10-04 DIAGNOSIS — I10 Essential (primary) hypertension: Secondary | ICD-10-CM

## 2017-10-04 DIAGNOSIS — F32A Depression, unspecified: Secondary | ICD-10-CM

## 2017-10-04 DIAGNOSIS — E1159 Type 2 diabetes mellitus with other circulatory complications: Secondary | ICD-10-CM

## 2017-10-04 DIAGNOSIS — C73 Malignant neoplasm of thyroid gland: Secondary | ICD-10-CM

## 2017-10-04 DIAGNOSIS — E782 Mixed hyperlipidemia: Secondary | ICD-10-CM | POA: Diagnosis not present

## 2017-10-04 DIAGNOSIS — E669 Obesity, unspecified: Secondary | ICD-10-CM | POA: Diagnosis not present

## 2017-10-04 DIAGNOSIS — F419 Anxiety disorder, unspecified: Secondary | ICD-10-CM

## 2017-10-04 DIAGNOSIS — E890 Postprocedural hypothyroidism: Secondary | ICD-10-CM | POA: Diagnosis not present

## 2017-10-04 DIAGNOSIS — M159 Polyosteoarthritis, unspecified: Secondary | ICD-10-CM

## 2017-10-04 DIAGNOSIS — M8949 Other hypertrophic osteoarthropathy, multiple sites: Secondary | ICD-10-CM

## 2017-10-04 DIAGNOSIS — M150 Primary generalized (osteo)arthritis: Secondary | ICD-10-CM | POA: Diagnosis not present

## 2017-10-04 NOTE — Progress Notes (Signed)
Jennifer Blackburn     MRN: [id number]      DOB: 01/22/1946   HPI Jennifer Blackburn is here for follow up and re-evaluation of chronic medical conditions, medication management and review of any available recent lab and radiology data.  Preventive health is updated, specifically  Cancer screening and Immunization.     5 day h/o bilateral foot swelling ongoing cost and s/e issues with diabetic mediciation will see Dr Carmela Rima next week  She discontinued Victoza 1 week ago, cost of her medication remains prohibitive and se states that her appt with the new Provider she requested is in the new year, so she is asking that she be referred to someone else Denies polyuria, polydipsia, blurred vision , or hypoglycemic episodes. States that her blood sugars remain above goal more often than not ROS Denies recent fever or chills. Denies sinus pressure, nasal congestion, ear pain or sore throat. Denies chest congestion, productive cough or wheezing. Denies chest pains, palpitations , PND or orthopnea Denies abdominal pain, nausea, vomiting,diarrhea or constipation.   Denies dysuria, frequency, hesitancy or incontinence. Denies uncontrolled  joint pain, swelling and limitation in mobility. Denies uncontrolled  Headaches, denies seizures,  C/o intermittent numbness, and  tingling. Denies uncontrolled  depression, anxiety or insomnia. Denies skin break down or rash.   PE BP 110/70 (BP Location: Left Arm, Patient Position: Sitting, Cuff Size: Normal)   Pulse 68   Resp 12   Wt 192 lb 12.8 oz (87.5 kg)   LMP 11/11/2016   SpO2 97%   BMI 32.08 kg/m     Patient alert and oriented and in no cardiopulmonary distress.  HEENT: No facial asymmetry, EOMI,   oropharynx pink and moist.  Neck supple no JVD, no mass.  Chest: Clear to auscultation bilaterally.  CVS: S1, S2 no murmurs, no S3.Regular rate.  ABD: Soft non tender.   Ext: trace  Edema bilaterally in the feet, deformity of both ankles  MS:  Adequate though reduced  ROM spine, shoulders, hips and knees.Deformity of both ankles from osteoarthritis  Skin: Intact, no ulcerations or rash noted.  Psych: Good eye contact, normal affect. Memory intact not anxious or depressed appearing.  CNS: CN 2-12 intact, power,  normal throughout.no focal deficits noted.   Assessment & Plan  Osteoarthritis Pt re assured that her perceived leg swelling is really ankle deformity from ostearthritis and that she has normal heart and kidney function  Type 2 diabetes mellitus with vascular disease ([place]) Uncontrolled and pt repeatedly states that she is unable to afford  Her medication. Requests referral to a new Endocrinologist, and states that long delay in wait time for the original MD requested Will refer to Dr Renne Crigler Jennifer Blackburn is reminded of the importance of commitment to daily physical activity for 30 minutes or more, as able and the need to limit carbohydrate intake to 30 to 60 grams per meal to help with blood sugar control.   The need to take medication as prescribed, test blood sugar as directed, and to call between visits if there is a concern that blood sugar is uncontrolled is also discussed.   [name] is reminded of the importance of daily foot exam, annual eye examination, and good blood sugar, blood pressure and cholesterol control.  Diabetic Labs Latest Ref Rng & Units 08/06/2017 06/14/2017 06/09/2017 07/13/2016 03/23/2016  HbA1c - 8.9 - - - -  Microalbumin Not Estab. ug/mL - 19.5(H) - - -  Micro/Creat Ratio 0.0 - 30.0 mg/g creat -  11.5 - - -  Chol <200 mg/dL - - 191 172 -  HDL >50 mg/dL - - 45(L) 42(L) -  Calc LDL mg/dL (calc) - - 120(H) 100(H) -  Triglycerides <150 mg/dL - - 150(H) 148 -  Creatinine 0.60 - 0.93 mg/dL - - 0.61 0.69 0.78   BP/Weight 10/25/2017 10/04/2017 08/29/2017 06/13/2017 05/27/2017 8/67/6195 0/11/3265  Systolic BP - 124 580 998 338 250 539  Diastolic BP - 70 72 80 70 64 74  Wt. (Lbs) 195 192.8 191 206 202.5 203.6  212  BMI 32.45 32.08 31.78 34.28 33.7 33.88 35.28   Foot/eye exam completion dates Latest Ref Rng & Units 12/26/2015 10/15/2015  Eye Exam No Retinopathy Retinopathy(A) -  Foot exam Order - - -  Foot Form Completion - - Done        Hypertension Controlled, no change in medication DASH diet and commitment to daily physical activity for a minimum of 30 minutes discussed and encouraged, as a part of hypertension management. The importance of attaining a healthy weight is also discussed.  BP/Weight 10/25/2017 10/04/2017 08/29/2017 06/13/2017 05/27/2017 7/67/3419 05/27/9022  Systolic BP - 097 353 299 242 683 419  Diastolic BP - 70 72 80 70 64 74  Wt. (Lbs) 195 192.8 191 206 202.5 203.6 212  BMI 32.45 32.08 31.78 34.28 33.7 33.88 35.28       Hyperlipidemia Hyperlipidemia:Low fat diet discussed and encouraged.   Lipid Panel  Lab Results  Component Value Date   CHOL 191 06/09/2017   HDL 45 (L) 06/09/2017   LDLCALC 120 (H) 06/09/2017   TRIG 150 (H) 06/09/2017   CHOLHDL 4.2 06/09/2017  uncontrolled , needs to reduce fat in diet and take,medication as prescribed, also needs to increase exercise commitment Updated lab needed at/ before next visit.      Obesity with body mass index greater than 30 Deteriorated. Patient re-educated about  the importance of commitment to a  minimum of 150 minutes of exercise per week.  The importance of healthy food choices with portion control discussed. Encouraged to start a food diary, count calories and to consider  joining a support group. Sample diet sheets offered. Goals set by the patient for the next several months.   Weight /BMI 10/25/2017 10/04/2017 08/29/2017  WEIGHT 195 lb 192 lb 12.8 oz 191 lb  HEIGHT 5\' 5"  - 5\' 5"   BMI 32.45 kg/m2 32.08 kg/m2 31.78 kg/m2      Anemia On supplemental iron   Anxiety and depression Controlled, no change in medication   Postsurgical hypothyroidism Pt on thyroid replacement which is monitored and  managed  by endo  Bilateral leg edema Trac edema of oth feet, which is physiologic , and not pathologic, this is explained to patient

## 2017-10-04 NOTE — Patient Instructions (Addendum)
F/U  As before, call if you need me sooner  Your kidneys, heart and liver work very well.  You have arthritis in your ankles . So they do not look like they did when you were 15!  Keep doing what you are doing!!  I will refer you to another Endocrinologist

## 2017-10-05 ENCOUNTER — Other Ambulatory Visit: Payer: Self-pay

## 2017-10-05 ENCOUNTER — Other Ambulatory Visit: Payer: Self-pay | Admitting: Pharmacist

## 2017-10-05 NOTE — Patient Outreach (Signed)
[facility]) Care Management  10/05/2017  MEKENNA FINAU 10-23-1945 [id number]   [facility] has called and notified the patient that I will be closing the case due to the patient is not eligible for services at this time.  Plan:  [facility] will also send notification to the patient and the physician of case closure.  Lazaro Arms RN, BSN, [place] Direct Dial:  [phone]  Fax: [phone]

## 2017-10-05 NOTE — Patient Outreach (Signed)
[place] [facility]) Care Management  10/05/2017  Jennifer Blackburn [birth year] [id number]  Communication received from [place] regarding Jennifer Blackburn and medication assistance.  Per RN, patient has been followed for diabetes management however is no longer eligible for [facility] services based on current insurance plan.  RN requests that pharmacy reach out to patient to offer medication assistance information prior to closing case.   Successful call to Jennifer Blackburn today.  I explained reason for calling and offered information on [employer] PAP (Toujeo --> Basaglar substitution) and BI PAP (Jardiance).  I reviewed with patient the application process and needing to have provider and patient complete paperwork for each company.  Patient voiced understanding.  I also provided [facility] contact number to patient to call regarding Medicare plans and counseling on best plan for 2020.    Plan: [place] will close case.  I am happy to assist once patient is eligible again for [facility] services.   Ralene Bathe, PharmD, [place] [phone]

## 2017-10-12 ENCOUNTER — Other Ambulatory Visit: Payer: Self-pay

## 2017-10-12 ENCOUNTER — Ambulatory Visit (HOSPITAL_COMMUNITY): Payer: Medicare Other | Attending: Orthopedic Surgery

## 2017-10-12 ENCOUNTER — Encounter (HOSPITAL_COMMUNITY): Payer: Self-pay

## 2017-10-12 DIAGNOSIS — R29898 Other symptoms and signs involving the musculoskeletal system: Secondary | ICD-10-CM | POA: Diagnosis not present

## 2017-10-12 DIAGNOSIS — M25611 Stiffness of right shoulder, not elsewhere classified: Secondary | ICD-10-CM | POA: Diagnosis not present

## 2017-10-12 DIAGNOSIS — G8929 Other chronic pain: Secondary | ICD-10-CM

## 2017-10-12 DIAGNOSIS — M25511 Pain in right shoulder: Secondary | ICD-10-CM | POA: Diagnosis not present

## 2017-10-12 NOTE — Therapy (Signed)
[place], [place], [address] Phone: [phone]   Fax:  [phone]  Occupational Therapy Evaluation  Patient Details  Name: Jennifer Blackburn MRN: [id number] Date of Birth: 18-May-1945 Referring Provider: Justice Britain, MD   Encounter Date: 10/12/2017  OT End of Session - 10/12/17 1011    Visit Number  1    Number of Visits  5    Date for OT Re-Evaluation  11/09/17    Authorization Type  Primary is Medicare; secondary is [employer]; no visit limit    Authorization Time Period  no autorization required    OT Start Time  0902    OT Stop Time  0944    OT Time Calculation (min)  42 min    Activity Tolerance  Patient tolerated treatment well    Behavior During Therapy  [facility] for tasks assessed/performed       Past Medical History:  Diagnosis Date  . Anxiety   . CAD (coronary artery disease)   . Depression   . Diabetes mellitus type II    without complication  . DJD (degenerative joint disease) of lumbar spine   . Hypercholesteremia   . Hyperlipidemia   . Hypertension    benign   . Thyroid cancer ([place]) 2001    Past Surgical History:  Procedure Laterality Date  . ABDOMINAL HYSTERECTOMY    . CARDIAC CATHETERIZATION    . CATARACT EXTRACTION W/PHACO Right 07/17/2012   Procedure: CATARACT EXTRACTION PHACO AND INTRAOCULAR LENS PLACEMENT (IOC);  Surgeon: Tonny Branch, MD;  Location: [facility] ORS;  Service: Ophthalmology;  Laterality: Right;  CDE:25.51  . CATARACT EXTRACTION W/PHACO Left 09/17/2016   Procedure: CATARACT EXTRACTION PHACO AND INTRAOCULAR LENS PLACEMENT LEFT EYE;  Surgeon: Tonny Branch, MD;  Location: [facility];  Service: Ophthalmology;  Laterality: Left;  CDE: 19.23  . COLONOSCOPY    . COLONOSCOPY N/A 01/15/2014   Procedure: COLONOSCOPY;  Surgeon: Daneil Dolin, MD;  Location: [facility] SUITE;  Service: Endoscopy;  Laterality: N/A;  9:00 AM  . DOPPLER ECHOCARDIOGRAPHY    . JOINT REPLACEMENT  07/01/2010   left hip  . KNEE SURGERY Right    arthroscopy  . left hip replaced  07/01/2010  . SPINE SURGERY  2006   cervical  . stress dipyridamole myocardial perfusion    . THYROIDECTOMY    . TONSILLECTOMY    . VESICOVAGINAL FISTULA CLOSURE W/ TAH      There were no vitals filed for this visit.  Subjective Assessment - 10/12/17 0911    Subjective   S: I just kept ignoring it for a while but it has gotten to the point where I can't reach back and clip my bra anymore.    Pertinent History  Patient is a 72 y/o female presenting with adhesive capsulitis in right shoulder. She reports symptoms began approximately 6 months ago but have gradually worsened. She received a cortisone injection in the posterior shoulder on June 7th and reports it has helped increase her ROM.  Patient reporting pain in the top of the shoulder and upper trapezius region. She also reports an occasional light pins and needles sensation in the right hand first presenting approximately 3 months ago. She feels her grip has weakened and she is experiencing increased difficulty opening containers and water bottles. Patient has hx of carpal tunnel surgery 3 years ago on right wrist. Dr. Onnie Graham has referred the patient to skilled OT services for evaluation and treatment.  Patient Stated Goals  To reduce the pain so that she does not have to take a pain pill every time she goes out.    Currently in Pain?  Yes    Pain Score  7     Pain Location  Shoulder    Pain Orientation  Right    Pain Descriptors / Indicators  Aching    Pain Type  Chronic pain    Pain Radiating Towards  neck    Pain Onset  More than a month ago approximately 6 months ago    Pain Frequency  Constant    Aggravating Factors   certain movements    Pain Relieving Factors  pain medication    Effect of Pain on Daily Activities  moderate effect on ADLs    Multiple Pain Sites  No        [facility] OT Assessment - 10/12/17 0912      Assessment   Medical Diagnosis  right  adhesive capsulitis    Referring Provider  Justice Britain, MD    Onset Date/Surgical Date  -- approximately 6 months ago    Hand Dominance  Right    Next MD Visit  -- in August    Prior Therapy  none      Precautions   Precautions  None      Restrictions   Weight Bearing Restrictions  No      Balance Screen   Has the patient fallen in the past 6 months  No    Has the patient had a decrease in activity level because of a fear of falling?   No    Is the patient reluctant to leave their home because of a fear of falling?   No      Home  Environment   Family/patient expects to be discharged to:  Private residence    Lives With  Spouse      Prior Function   Level of [place]  Retired    Leisure  She enjoys going to church, cooking, traveling with grandkids, working in the yard, reading, and going shopping with friends.      ADL   ADL comments  She has trouble reaching back to fasten/unfasten her bra, and overhead reaching activities. She also reports difficulty with opening containers.      Mobility   Mobility Status  Independent      Vision - History   Baseline Vision  No visual deficits      Cognition   Overall Cognitive Status  Within Functional Limits for tasks assessed      ROM / Strength   AROM / PROM / Strength  AROM;PROM;Strength      Palpation   Palpation comment  Moderate fascial restrictions in upper arm, trapezius, and scapularis regions      AROM   Overall AROM   -- cervical ROM WFL    Overall AROM Comments  assessed seated; IR/er adducted    AROM Assessment Site  Shoulder    Right/Left Shoulder  Right    Right Shoulder Flexion  120 Degrees    Right Shoulder ABduction  105 Degrees    Right Shoulder Internal Rotation  75 Degrees    Right Shoulder External Rotation  70 Degrees      PROM   Overall PROM Comments  assessed supine; IR/er adducted    PROM Assessment Site  Shoulder    Right/Left Shoulder  Right    Right Shoulder  Flexion  122 Degrees    Right Shoulder ABduction  170 Degrees    Right Shoulder Internal Rotation  75 Degrees    Right Shoulder External Rotation  70 Degrees      Strength   Overall Strength Comments  assessed seated; IR/er adducted    Strength Assessment Site  Shoulder    Right/Left Shoulder  Right    Right Shoulder Flexion  3+/5    Right Shoulder Extension  3+/5    Right Shoulder ABduction  3+/5    Right Shoulder Internal Rotation  4/5    Right Shoulder External Rotation  4-/5      Hand Function   Right Hand Grip (lbs)  21    Right Hand Lateral Pinch  9 lbs    Right Hand 3 Point Pinch  8 lbs    Left Hand Grip (lbs)  23    Left Hand Lateral Pinch  7 lbs    Left 3 point pinch  5 lbs               OT Treatments/Exercises (OP) - 10/12/17 1007      Exercises   Exercises  Shoulder;Neck            OT Education - 10/12/17 0951    Education Details  Patient given shoulder and cervical stretches to work on as part of HEP.    Person(s) Educated  Patient    Methods  Explanation;Demonstration;Verbal cues;Handout    Comprehension  Verbalized understanding;Returned demonstration       OT Short Term Goals - 10/12/17 1026      OT SHORT TERM GOAL #1   Title  Patient will be educated and independent with HEP to faciliate progress in therapy and allow her to return to using her RUE as her dominant extremity for all daily tasks.    Time  4    Period  Weeks    Status  New    Target Date  11/09/17      OT SHORT TERM GOAL #2   Title  Patient will increase RUE strength to 4+/5 in order to complete daily tasks and be able to complete light lifting tasks.    Time  4    Period  Weeks    Status  New      OT SHORT TERM GOAL #3   Title  Patient will increase RUE A/ROM to [facility] to increase ability to complete dressing tasks with dominant extremity with less difficulty.      Time  4    Period  Weeks    Status  New      OT SHORT TERM GOAL #4   Title  Patient will decrease  pain level in RUE during daily tasks to 4/10 or less.      Time  4    Period  Weeks    Status  New      OT SHORT TERM GOAL #5   Title  Patient will decrease fascial restrictions in RUE to min amount or less in order to increase functional mobility needed to complete functional reaching tasks.      Time  4    Period  Weeks    Status  New      Additional Short Term Goals   Additional Short Term Goals  Yes      OT SHORT TERM GOAL #6   Title  Patient will increase grip strength to 10# and pinch strength to 3# in order  to be able to hold onto light weight items and open containers with less difficulty.    Time  4    Period  Weeks    Status  New               Plan - 10/12/17 1015    Clinical Impression Statement  Patient is a 72 y/o female s/p right shoulder adhesive capsulitis causing increased fascial restrictions, decreased strength, decreased ROM, decreased grip/pinch strength, and difficulty completing daily tasks using her dominant RUE. Patient is reporting an occasional pins and needles sensation in right hand as well as pain radiating towards her neck. Patient and therapist in agreement with MD reccomendation and will schedule appointments for skilled OT services 1X a week for 4 weeks.    Occupational Profile and client history currently impacting functional performance  Patient is motivated to return to prior level of function. Patient was independent prior to injury.     Occupational performance deficits (Please refer to evaluation for details):  ADL's;IADL's;Rest and Sleep;Leisure    Rehab Potential  Good    Current Impairments/barriers affecting progress:  previous hx of carpal tunnel; high pain level with movement    OT Frequency  1x / week    OT Duration  4 weeks    OT Treatment/Interventions  Self-care/ADL training;Moist Heat    Plan  P: Patient will benefit from skilled OT services to increase functional performane during daily and leisure tasks. Treatment Plan:  Myofascial release, manual stretching, P/ROM, AA/ROM, A/ROM, general shoulder and scapular strengthening, grip strength, and pinch strength. Modalities PRN.      Clinical Decision Making  Several treatment options, min-mod task modification necessary    OT Home Exercise Plan  cervical and shoulder stretches    Consulted and Agree with Plan of Care  Patient       Patient will benefit from skilled therapeutic intervention in order to improve the following deficits and impairments:  Decreased range of motion, Decreased endurance, Impaired sensation, Decreased activity tolerance, Increased fascial restrictions, Impaired UE functional use, Pain, Decreased strength  Visit Diagnosis: Stiffness of right shoulder, not elsewhere classified  Chronic right shoulder pain  Other symptoms and signs involving the musculoskeletal system  Right hand weakness    Problem List Patient Active Problem List   Diagnosis Date Noted  . Cervical spondylosis with radiculopathy 06/20/2017  . Hypertension   . Hyperlipidemia   . Hypercholesteremia   . Depression   . CAD (coronary artery disease)   . Anxiety   . Hypertensive disorder 02/17/2017  . Malignant tumor of thyroid gland ([place]) 02/17/2017  . Menopausal symptom 02/17/2017  . Osteoarthritis 02/17/2017  . Family history of coronary artery disease in father 02/17/2017  . Lumbar spondylosis with myelopathy 07/18/2016  . Laryngopharyngeal reflux (LPR) 06/24/2016  . Type 2 diabetes mellitus with vascular disease ([place]) 01/13/2015  . GERD (gastroesophageal reflux disease) 04/11/2014  . At high risk for falls 12/31/2013  . Postsurgical hypothyroidism 01/05/2013  . Snoring 01/05/2013  . Lower leg edema 09/02/2012  . TMJ arthritis 12/06/2011  . Anxiety and depression 10/16/2011  . Anemia 03/30/2011  . Vitamin D deficiency 10/14/2010  . Chest pain 07/18/2009  . Obesity with body mass index greater than 30 03/30/2008  . Allergic rhinitis 11/23/2007  .  Diabetes mellitus type 2 in obese ([place]) 04/14/2007  . Dyslipidemia 04/14/2007  . CARPAL TUNNEL SYNDROME, BILATERAL 04/14/2007  . Essential hypertension 04/14/2007  . Coronary atherosclerosis 04/14/2007  . Headache 04/14/2007  .  Thyroid cancer ([place]) 03/23/1999    Roderic Palau, OT student 10/12/2017, 10:34 AM  [place], [place], [address] Phone: [phone]   Fax:  [phone]  Name: Jennifer Blackburn MRN: [id number] Date of Birth: Nov 28, 1945

## 2017-10-12 NOTE — Patient Instructions (Signed)
Complete the following exercises 2-3 times a day.  Doorway Stretch  Place each hand opposite each other on the doorway. (You can change where you feel the stretch by moving arms higher or lower.) Step through with one foot and bend front knee until a stretch is felt and hold. Step through with the opposite foot on the next rep. Hold for __10-15___ seconds. Repeat __2__times.     Scapular Retraction (Standing)   With arms at sides, pinch shoulder blades together. Repeat __10__ times per set. Do __1__ sets per session. Do __2__ sessions per day.  http://orth.exer.us/944   Copyright  VHI. All rights reserved.   Internal Rotation Across Back  Grab the end of a towel with your affected side, palm facing backwards. Grab the towel with your unaffected side and pull your affected hand across your back until you feel a stretch in the front of your shoulder. If you feel pain, pull just to the pain, do not pull through the pain. Hold. Return your affected arm to your side. Try to keep your hand/arm close to your body during the entire movement.     Hold for 10-15 seconds. Complete 2 times.        Posterior Capsule Stretch   Stand or sit, one arm across body so hand rests over opposite shoulder. Gently push on crossed elbow with other hand until stretch is felt in shoulder of crossed arm. Hold _10-15__ seconds.  Repeat _2__ times per session. Do ___ sessions per day.   Wall Flexion  Slide your arm up the wall or door frame until a stretch is felt in your shoulder . Hold for 10-15 seconds. Complete 2 times     Shoulder Abduction Stretch  Stand side ways by a wall with affected up on wall. Gently step in toward wall to feel stretch. Hold for 10-15 seconds. Complete 2 times.    Scalene Stretch, Sitting  Repeat __10_ times per session. Do _1-2__ sessions per day.  Copyright  VHI. All rights reserved.  Scalene Stretch, Sitting   Sit, one hand tucked under hip on side to be  stretched, other hand over top of head. Gently pull head to side and backwards. Hold ___ seconds. For more stretch, lean body in direction of head pull. Repeat ___ times per session. Do ___ sessions per day.  Copyright  VHI. All rights reserved.  Side Bend, Standing   Stand, one hand grasping other arm above wrist behind body. Pull down while gently tilting head toward pulling side. Hold ___ seconds. Repeat ___ times per session. Do ___ sessions per day.  Copyright  VHI. All rights reserved.  Levator Scapula Stretch, Sitting   Sit, one hand on same-side shoulder blade, other hand on head. Gently pull head down and away. Hold ___ seconds. Repeat ___ times per session. Do ___ sessions per day.  Copyright  VHI. All rights reserved.

## 2017-10-13 ENCOUNTER — Other Ambulatory Visit: Payer: Self-pay | Admitting: Family Medicine

## 2017-10-13 ENCOUNTER — Telehealth (HOSPITAL_COMMUNITY): Payer: Self-pay | Admitting: Family Medicine

## 2017-10-13 NOTE — Telephone Encounter (Signed)
10/13/17  Called and left patient a message asking if she could come in Monday, 7/29 at 5:30 instead of Tuesday, 7/30 ... Mickel Baas is leaving early

## 2017-10-17 ENCOUNTER — Ambulatory Visit (HOSPITAL_COMMUNITY): Payer: Medicare Other

## 2017-10-18 ENCOUNTER — Encounter (HOSPITAL_COMMUNITY): Payer: Medicare Other

## 2017-10-25 ENCOUNTER — Encounter: Payer: Medicare Other | Attending: Family Medicine | Admitting: Nutrition

## 2017-10-25 VITALS — Ht 65.0 in | Wt 195.0 lb

## 2017-10-25 DIAGNOSIS — E118 Type 2 diabetes mellitus with unspecified complications: Secondary | ICD-10-CM

## 2017-10-25 DIAGNOSIS — Z6832 Body mass index (BMI) 32.0-32.9, adult: Secondary | ICD-10-CM | POA: Diagnosis not present

## 2017-10-25 DIAGNOSIS — E1169 Type 2 diabetes mellitus with other specified complication: Secondary | ICD-10-CM | POA: Insufficient documentation

## 2017-10-25 DIAGNOSIS — E669 Obesity, unspecified: Secondary | ICD-10-CM | POA: Insufficient documentation

## 2017-10-25 DIAGNOSIS — IMO0002 Reserved for concepts with insufficient information to code with codable children: Secondary | ICD-10-CM

## 2017-10-25 DIAGNOSIS — Z713 Dietary counseling and surveillance: Secondary | ICD-10-CM | POA: Insufficient documentation

## 2017-10-25 DIAGNOSIS — E1165 Type 2 diabetes mellitus with hyperglycemia: Secondary | ICD-10-CM

## 2017-10-25 NOTE — Progress Notes (Signed)
  Medical Nutrition Therapy:  Appt start time: 1330 end time:  1430.   Assessment:  Primary concerns today: Diabetes Type 2, Obesity. She lives with her husband. She cooks and shops. Her husband brings a lot of foods in the house.  Had thryroid cancer.  Had DM for 20 yrs.  Sees. Dr. Donney Rankins- Endocrinologist in [place]. She eats 3 meals per day.  Testing blood sugars 1-2 times per day. FBS 180-190's.  Toujeo 40 units a day. Metformin 1000 mg BID. Glipizide. Can't afford the Victoza from being in the donut hole.   Lab Results  Component Value Date   HGBA1C 8.9 08/06/2017   Not exercising.  Preferred Learning Style:   No preference indicated    Learning Readiness:    Contemplating   MEDICATIONS: see list   DIETARY INTAKE:  24-hr recall:  B ( AM): 1 low calorie bread, sausage, egg and coffee  Snk ( AM):  L ( PM): tomato sandwich, sunsweet  tea Snk ( PM): cantaloupe 1 cup D ( PM):Cube stead, fried squash and potatoes, 1 biscuit and water Snk ( PM): 1/2 c applesauce and 2 saltine crackers.  Beverages: water, unsweet tea  Usual physical activity: ADL  Estimated energy needs: 1200  calories 135 g carbohydrates 90 g protein 33 g fat  Progress Towards Goal(s):  In progress.   Nutritional Diagnosis:  NB-1.1 Food and nutrition-related knowledge deficit As related to Diabetes and Obesity.  As evidenced by A1C 8.9  and BMI 32    Intervention:  Nutrition and Diabetes education provided on My Plate, CHO counting, meal planning, portion sizes, timing of meals, avoiding snacks between meals unless having a low blood sugar, target ranges for A1C and blood sugars, signs/symptoms and treatment of hyper/hypoglycemia, monitoring blood sugars, taking medications as prescribed, benefits of exercising 30 minutes per day and prevention of complications of DM. Marland Kitchen Goals 1. Follow MY Plate 2. Increase fresh fruits and vegetables Shop with a list. 3. Eat 2-3 carb choices per meal 3. Cut  out snacks  And don't eat past 7 pm Lose 1 lb per week Get FBS less than 150 and less than 150 mg/dl before bedtime  Teaching Method Utilized:  Visual Auditory Hands on  Handouts given during visit include:  The Plate Method   Meal Plan Card   Barriers to learning/adherence to lifestyle change: None   Demonstrated degree of understanding via:  Teach Back   Monitoring/Evaluation:  Dietary intake, exercise, meal planning , and body weight in 1 month(s).

## 2017-10-25 NOTE — Patient Instructions (Signed)
Goals 1. Follow MY Plate 2. Increase fresh fruits and vegetables Shop with a list. 3. Eat 2-3 carb choices per meal 3. Cut out snacks  And don't eat past 7 pm Lose 1 lb per week Get FBS less than 150 and less than 150 mg/dl before bedtime

## 2017-10-26 ENCOUNTER — Encounter (HOSPITAL_COMMUNITY): Payer: Self-pay

## 2017-10-26 ENCOUNTER — Ambulatory Visit (HOSPITAL_COMMUNITY): Payer: Medicare Other | Attending: Orthopedic Surgery

## 2017-10-26 DIAGNOSIS — R29898 Other symptoms and signs involving the musculoskeletal system: Secondary | ICD-10-CM | POA: Insufficient documentation

## 2017-10-26 DIAGNOSIS — M25611 Stiffness of right shoulder, not elsewhere classified: Secondary | ICD-10-CM | POA: Insufficient documentation

## 2017-10-26 DIAGNOSIS — G8929 Other chronic pain: Secondary | ICD-10-CM | POA: Diagnosis not present

## 2017-10-26 DIAGNOSIS — M25511 Pain in right shoulder: Secondary | ICD-10-CM | POA: Diagnosis not present

## 2017-10-26 NOTE — Therapy (Signed)
[place], [place], [address] Phone: [phone]   Fax:  [phone]  Occupational Therapy Treatment  Patient Details  Name: Jennifer Blackburn MRN: [id number] Date of Birth: 23-Dec-1945 Referring Provider: Justice Britain, MD   Encounter Date: 10/26/2017  OT End of Session - 10/26/17 1137    Visit Number  2    Number of Visits  5    Date for OT Re-Evaluation  11/16/17    Authorization Type  Primary is Medicare; secondary is [employer] [facility]; no visit limit    Authorization Time Period  no autorization required    OT Start Time  1118    OT Stop Time  1157    OT Time Calculation (min)  39 min    Activity Tolerance  Patient tolerated treatment well    Behavior During Therapy  [facility] for tasks assessed/performed       Past Medical History:  Diagnosis Date  . Anxiety   . CAD (coronary artery disease)   . Depression   . Diabetes mellitus type II    without complication  . DJD (degenerative joint disease) of lumbar spine   . Hypercholesteremia   . Hyperlipidemia   . Hypertension    benign   . Thyroid cancer ([place]) 2001    Past Surgical History:  Procedure Laterality Date  . ABDOMINAL HYSTERECTOMY    . CARDIAC CATHETERIZATION    . CATARACT EXTRACTION W/PHACO Right 07/17/2012   Procedure: CATARACT EXTRACTION PHACO AND INTRAOCULAR LENS PLACEMENT (IOC);  Surgeon: Tonny Branch, MD;  Location: [facility] ORS;  Service: Ophthalmology;  Laterality: Right;  CDE:25.51  . CATARACT EXTRACTION W/PHACO Left 09/17/2016   Procedure: CATARACT EXTRACTION PHACO AND INTRAOCULAR LENS PLACEMENT LEFT EYE;  Surgeon: Tonny Branch, MD;  Location: [facility] ORS;  Service: Ophthalmology;  Laterality: Left;  CDE: 19.23  . COLONOSCOPY    . COLONOSCOPY N/A 01/15/2014   Procedure: COLONOSCOPY;  Surgeon: Daneil Dolin, MD;  Location: [facility] ENDO SUITE;  Service: Endoscopy;  Laterality: N/A;  9:00 AM  . DOPPLER ECHOCARDIOGRAPHY    . JOINT REPLACEMENT  07/01/2010   left hip  . KNEE SURGERY Right    arthroscopy  . left hip replaced  07/01/2010  . SPINE SURGERY  2006   cervical  . stress dipyridamole myocardial perfusion    . THYROIDECTOMY    . TONSILLECTOMY    . VESICOVAGINAL FISTULA CLOSURE W/ TAH      There were no vitals filed for this visit.  Subjective Assessment - 10/26/17 1124    Subjective   S: It's sore but I've been working on my stretches and I think it's going good.     Currently in Pain?  No/denies         [facility] OT Assessment - 10/26/17 1138      Assessment   Medical Diagnosis  right adhesive capsulitis    Onset Date/Surgical Date  -- approximately 6 months ago      Precautions   Precautions  None      Prior Function   Level of Independence  Independent               OT Treatments/Exercises (OP) - 10/26/17 1138      Exercises   Exercises  Shoulder      Shoulder Exercises: Supine   Protraction  PROM;5 reps;AROM;12 reps    Horizontal ABduction  PROM;5 reps;AROM;12 reps    External Rotation  PROM;5 reps;AROM;12 reps  Internal Rotation  PROM;5 reps;AROM;12 reps    Flexion  PROM;5 reps;AROM;12 reps    ABduction  PROM;5 reps;AROM;12 reps      Shoulder Exercises: Standing   Protraction  AROM;10 reps    Horizontal ABduction  AROM;10 reps    External Rotation  AROM;10 reps    Internal Rotation  AROM;10 reps    Flexion  AROM;10 reps    ABduction  AROM;10 reps    Extension  Theraband;10 reps    Theraband Level (Shoulder Extension)  Level 2 (Red)    Row  Theraband;10 reps    Theraband Level (Shoulder Row)  Level 2 (Red)    Other Standing Exercises  Scapular depression; A/ROM; RUE; 10X      Manual Therapy   Manual Therapy  Soft tissue mobilization    Manual therapy comments  Manual therapy completed prior to exercises.     Soft tissue mobilization  Myofascial release and manual stretching completed to right upper arm, trapezius, and scapularis region to decrease fascial restrictions and increase joint  mobility and range of motion during functional tasks.              OT Education - 10/26/17 1136    Education Details  Pt given OT evaluation print out. reviewed goals and plan of care.     Person(s) Educated  Patient    Methods  Explanation;Handout    Comprehension  Verbalized understanding       OT Short Term Goals - 10/26/17 1137      OT SHORT TERM GOAL #1   Title  Patient will be educated and independent with HEP to faciliate progress in therapy and allow her to return to using her RUE as her dominant extremity for all daily tasks.    Time  4    Period  Weeks    Status  On-going      OT SHORT TERM GOAL #2   Title  Patient will increase RUE strength to 4+/5 in order to complete daily tasks and be able to complete light lifting tasks.    Time  4    Period  Weeks    Status  On-going      OT SHORT TERM GOAL #3   Title  Patient will increase RUE A/ROM to [facility] to increase ability to complete dressing tasks with dominant extremity with less difficulty.      Time  4    Period  Weeks    Status  On-going      OT SHORT TERM GOAL #4   Title  Patient will decrease pain level in RUE during daily tasks to 4/10 or less.      Time  4    Period  Weeks    Status  On-going      OT SHORT TERM GOAL #5   Title  Patient will decrease fascial restrictions in RUE to min amount or less in order to increase functional mobility needed to complete functional reaching tasks.      Time  4    Period  Weeks    Status  On-going      OT SHORT TERM GOAL #6   Title  Patient will increase grip strength to 10# and pinch strength to 3# in order to be able to hold onto light weight items and open containers with less difficulty.    Time  4    Period  Weeks    Status  On-going  Plan - 10/26/17 1208    Clinical Impression Statement  A: Initiated myofascial release, manual stretching, and A/ROM during session. patient presents with increased passive and active ROM compared to  measurements at evaluation. Patient required VC for form and technique. Rest breaks taken when needed during to fatigue and muscle soreness. Resending MD certification due to missing OT interventions from previous certification.     OT Treatment/Interventions  Self-care/ADL training;Moist Heat;DME and/or AE instruction;Therapeutic activities;Therapeutic exercise;Ultrasound;Cryotherapy;Electrical Stimulation;Manual Therapy;Patient/family education;Passive range of motion    Plan  P: Provide scapular strengthening to HEP with red band. Increased repetitions with A/ROM or add 1# weights if able to tolerate.     Consulted and Agree with Plan of Care  Patient       Patient will benefit from skilled therapeutic intervention in order to improve the following deficits and impairments:  Decreased range of motion, Decreased endurance, Impaired sensation, Decreased activity tolerance, Increased fascial restrictions, Impaired UE functional use, Pain, Decreased strength  Visit Diagnosis: Stiffness of right shoulder, not elsewhere classified - Plan: Ot plan of care cert/re-cert  Chronic right shoulder pain - Plan: Ot plan of care cert/re-cert  Other symptoms and signs involving the musculoskeletal system - Plan: Ot plan of care cert/re-cert  Right hand weakness - Plan: Ot plan of care cert/re-cert    Problem List Patient Active Problem List   Diagnosis Date Noted  . Cervical spondylosis with radiculopathy 06/20/2017  . Hypertension   . Hyperlipidemia   . Hypercholesteremia   . Depression   . CAD (coronary artery disease)   . Anxiety   . Hypertensive disorder 02/17/2017  . Malignant tumor of thyroid gland ([place]) 02/17/2017  . Menopausal symptom 02/17/2017  . Osteoarthritis 02/17/2017  . Family history of coronary artery disease in father 02/17/2017  . Lumbar spondylosis with myelopathy 07/18/2016  . Laryngopharyngeal reflux (LPR) 06/24/2016  . Type 2 diabetes mellitus with vascular disease ([place])  01/13/2015  . GERD (gastroesophageal reflux disease) 04/11/2014  . At high risk for falls 12/31/2013  . Postsurgical hypothyroidism 01/05/2013  . Snoring 01/05/2013  . Lower leg edema 09/02/2012  . TMJ arthritis 12/06/2011  . Anxiety and depression 10/16/2011  . Anemia 03/30/2011  . Vitamin D deficiency 10/14/2010  . Chest pain 07/18/2009  . Obesity with body mass index greater than 30 03/30/2008  . Allergic rhinitis 11/23/2007  . Diabetes mellitus type 2 in obese ([place]) 04/14/2007  . Dyslipidemia 04/14/2007  . CARPAL TUNNEL SYNDROME, BILATERAL 04/14/2007  . Essential hypertension 04/14/2007  . Coronary atherosclerosis 04/14/2007  . Headache 04/14/2007  . Thyroid cancer [facility]) 03/23/1999   Ailene Ravel, OTR/L,CBIS  [phone]  10/26/2017, 12:16 PM  [place] [address] [place], [place], [address] Phone: [phone]   Fax:  [phone]  Name: AKIRRA LACERDA MRN: [id number] Date of Birth: [birth year]

## 2017-11-02 ENCOUNTER — Ambulatory Visit (HOSPITAL_COMMUNITY): Payer: Medicare Other

## 2017-11-02 ENCOUNTER — Encounter (HOSPITAL_COMMUNITY): Payer: Self-pay

## 2017-11-02 DIAGNOSIS — M25611 Stiffness of right shoulder, not elsewhere classified: Secondary | ICD-10-CM | POA: Diagnosis not present

## 2017-11-02 DIAGNOSIS — M25511 Pain in right shoulder: Secondary | ICD-10-CM

## 2017-11-02 DIAGNOSIS — R29898 Other symptoms and signs involving the musculoskeletal system: Secondary | ICD-10-CM

## 2017-11-02 DIAGNOSIS — G8929 Other chronic pain: Secondary | ICD-10-CM

## 2017-11-02 NOTE — Therapy (Addendum)
[place], [place], [address] Phone: [phone]   Fax:  [phone]  Occupational Therapy Treatment  Patient Details  Name: Jennifer Blackburn MRN: [id number] Date of Birth: [birth year] Referring Provider: Justice Britain, MD   Encounter Date: 11/02/2017  OT End of Session - 11/02/17 1139    Visit Number  3    Number of Visits  5    Date for OT Re-Evaluation  11/09/17    Authorization Type  Primary is Medicare; secondary is [employer] [facility]; no visit limit    Authorization Time Period  no autorization required    OT Start Time  1120    OT Stop Time  1205    OT Time Calculation (min)  45 min    Activity Tolerance  Patient tolerated treatment well    Behavior During Therapy  [facility] for tasks assessed/performed       Past Medical History:  Diagnosis Date  . Anxiety   . CAD (coronary artery disease)   . Depression   . Diabetes mellitus type II    without complication  . DJD (degenerative joint disease) of lumbar spine   . Hypercholesteremia   . Hyperlipidemia   . Hypertension    benign   . Thyroid cancer ([place]) 2001    Past Surgical History:  Procedure Laterality Date  . ABDOMINAL HYSTERECTOMY    . CARDIAC CATHETERIZATION    . CATARACT EXTRACTION W/PHACO Right 07/17/2012   Procedure: CATARACT EXTRACTION PHACO AND INTRAOCULAR LENS PLACEMENT (IOC);  Surgeon: Tonny Branch, MD;  Location: [facility];  Service: Ophthalmology;  Laterality: Right;  CDE:25.51  . CATARACT EXTRACTION W/PHACO Left 09/17/2016   Procedure: CATARACT EXTRACTION PHACO AND INTRAOCULAR LENS PLACEMENT LEFT EYE;  Surgeon: Tonny Branch, MD;  Location: [facility];  Service: Ophthalmology;  Laterality: Left;  CDE: 19.23  . COLONOSCOPY    . COLONOSCOPY N/A 01/15/2014   Procedure: COLONOSCOPY;  Surgeon: Daneil Dolin, MD;  Location: [facility] SU[facility];  Service: Endoscopy;  Laterality: N/A;  9:00 AM  . DOPPLER ECHOCARDIOGRAPHY    . JOINT REPLACEMENT  07/01/2010   left hip  . KNEE SURGERY Right    arthroscopy  . left hip replaced  07/01/2010  . SPINE SURGERY  2006   cervical  . stress dipyridamole myocardial perfusion    . THYROIDECTOMY    . TONSILLECTOMY    . VESICOVAGINAL FISTULA CLOSURE W/ TAH      There were no vitals filed for this visit.  Subjective Assessment - 11/02/17 1137    Subjective   S: I helped my daughter move this weekend so i just feel sore but no pain.     Currently in Pain?  No/denies         [facility] OT Assessment - 11/02/17 1140      Assessment   Medical Diagnosis  right adhesive capsulitis      Precautions   Precautions  None               OT Treatments/Exercises (OP) - 11/02/17 1140      Exercises   Exercises  Shoulder      Shoulder Exercises: Supine   Protraction  PROM;5 reps;Strengthening;10 reps    Protraction Weight (lbs)  1    Horizontal ABduction  PROM;5 reps;Strengthening;10 reps    Horizontal ABduction Weight (lbs)  1    External Rotation  PROM;5 reps;Strengthening;10 reps    External Rotation Weight (lbs)  1  Internal Rotation  PROM;5 reps;Strengthening;10 reps    Internal Rotation Weight (lbs)  1    Flexion  PROM;5 reps;Strengthening;10 reps    Shoulder Flexion Weight (lbs)  1    ABduction  PROM;5 reps;Strengthening;10 reps    Shoulder ABduction Weight (lbs)  1      Shoulder Exercises: Standing   Protraction  Strengthening;10 reps    Protraction Weight (lbs)  1    Extension  Theraband;10 reps    Theraband Level (Shoulder Extension)  Level 2 (Red)    Row  Theraband;10 reps    Theraband Level (Shoulder Row)  Level 2 (Red)    Retraction  Theraband;10 reps    Theraband Level (Shoulder Retraction)  Level 2 (Red)      Manual Therapy   Manual Therapy  Soft tissue mobilization    Manual therapy comments  Manual therapy completed prior to exercises.     Soft tissue mobilization  Myofascial release and manual stretching completed to right upper arm, trapezius, and scapularis region to  decrease fascial restrictions and increase joint mobility and range of motion during functional tasks.              OT Education - 11/02/17 1138    Education Details  Pt provided with scapular strengthening with red band.     Person(s) Educated  Patient    Methods  Explanation;Handout;Verbal cues;Tactile cues;Demonstration    Comprehension  Verbalized understanding       OT Short Term Goals - 10/26/17 1137      OT SHORT TERM GOAL #1   Title  Patient will be educated and independent with HEP to faciliate progress in therapy and allow her to return to using her RUE as her dominant extremity for all daily tasks.    Time  4    Period  Weeks    Status  On-going      OT SHORT TERM GOAL #2   Title  Patient will increase RUE strength to 4+/5 in order to complete daily tasks and be able to complete light lifting tasks.    Time  4    Period  Weeks    Status  On-going      OT SHORT TERM GOAL #3   Title  Patient will increase RUE A/ROM to [facility] to increase ability to complete dressing tasks with dominant extremity with less difficulty.      Time  4    Period  Weeks    Status  On-going      OT SHORT TERM GOAL #4   Title  Patient will decrease pain level in RUE during daily tasks to 4/10 or less.      Time  4    Period  Weeks    Status  On-going      OT SHORT TERM GOAL #5   Title  Patient will decrease fascial restrictions in RUE to min amount or less in order to increase functional mobility needed to complete functional reaching tasks.      Time  4    Period  Weeks    Status  On-going      OT SHORT TERM GOAL #6   Title  Patient will increase grip strength to 10# and pinch strength to 3# in order to be able to hold onto light weight items and open containers with less difficulty.    Time  4    Period  Weeks    Status  On-going  Plan - 11/02/17 1257    Clinical Impression Statement  A: Updated HEP to include red theraband scapular strengthening. Patient  was able to progress to 1# handweights to focus on shoulder and scapular strengthening and stability. VC for form and technique. Min fascial restrictions palpated in RUE with manual techniques completed to address.     Plan  P: Reassessment due to end of cert date. Extend cert if needed. or discharge with HEP.    Consulted and Agree with Plan of Care  Patient       Patient will benefit from skilled therapeutic intervention in order to improve the following deficits and impairments:  Decreased range of motion, Decreased endurance, Impaired sensation, Decreased activity tolerance, Increased fascial restrictions, Impaired UE functional use, Pain, Decreased strength  Visit Diagnosis: Stiffness of right shoulder, not elsewhere classified  Other symptoms and signs involving the musculoskeletal system  Chronic right shoulder pain  Right hand weakness    Problem List Patient Active Problem List   Diagnosis Date Noted  . Cervical spondylosis with radiculopathy 06/20/2017  . Hypertension   . Hyperlipidemia   . Hypercholesteremia   . Depression   . CAD (coronary artery disease)   . Anxiety   . Hypertensive disorder 02/17/2017  . Malignant tumor of thyroid gland ([place]) 02/17/2017  . Menopausal symptom 02/17/2017  . Osteoarthritis 02/17/2017  . Family history of coronary artery disease in father 02/17/2017  . Lumbar spondylosis with myelopathy 07/18/2016  . Laryngopharyngeal reflux (LPR) 06/24/2016  . Type 2 diabetes mellitus with vascular disease ([place]) 01/13/2015  . GERD (gastroesophageal reflux disease) 04/11/2014  . At high risk for falls 12/31/2013  . Postsurgical hypothyroidism 01/05/2013  . Snoring 01/05/2013  . Lower leg edema 09/02/2012  . TMJ arthritis 12/06/2011  . Anxiety and depression 10/16/2011  . Anemia 03/30/2011  . Vitamin D deficiency 10/14/2010  . Chest pain 07/18/2009  . Obesity with body mass index greater than 30 03/30/2008  . Allergic rhinitis 11/23/2007  .  Diabetes mellitus type 2 in obese ([place]) 04/14/2007  . Dyslipidemia 04/14/2007  . CARPAL TUNNEL SYNDROME, BILATERAL 04/14/2007  . Essential hypertension 04/14/2007  . Coronary atherosclerosis 04/14/2007  . Headache 04/14/2007  . Thyroid cancer [facility]) 03/23/1999   Ailene Ravel, OTR/L,CBIS  [phone]  11/02/2017, 12:59 PM  [facility] [address] [place], [place], [address] Phone: [phone]   Fax:  [phone]  Name: Jennifer Blackburn MRN: [id number] Date of Birth: 1945/06/12

## 2017-11-02 NOTE — Patient Instructions (Signed)

## 2017-11-03 ENCOUNTER — Ambulatory Visit: Payer: Medicare Other

## 2017-11-03 ENCOUNTER — Other Ambulatory Visit: Payer: Self-pay | Admitting: Family Medicine

## 2017-11-03 DIAGNOSIS — E7801 Familial hypercholesterolemia: Secondary | ICD-10-CM | POA: Diagnosis not present

## 2017-11-03 DIAGNOSIS — E890 Postprocedural hypothyroidism: Secondary | ICD-10-CM | POA: Diagnosis not present

## 2017-11-03 DIAGNOSIS — E1165 Type 2 diabetes mellitus with hyperglycemia: Secondary | ICD-10-CM | POA: Diagnosis not present

## 2017-11-03 DIAGNOSIS — D509 Iron deficiency anemia, unspecified: Secondary | ICD-10-CM | POA: Diagnosis not present

## 2017-11-03 DIAGNOSIS — I10 Essential (primary) hypertension: Secondary | ICD-10-CM | POA: Diagnosis not present

## 2017-11-03 DIAGNOSIS — E6609 Other obesity due to excess calories: Secondary | ICD-10-CM | POA: Diagnosis not present

## 2017-11-03 DIAGNOSIS — C73 Malignant neoplasm of thyroid gland: Secondary | ICD-10-CM | POA: Diagnosis not present

## 2017-11-06 ENCOUNTER — Encounter: Payer: Self-pay | Admitting: Family Medicine

## 2017-11-06 NOTE — Assessment & Plan Note (Signed)
Uncontrolled and pt repeatedly states that she is unable to afford  Her medication. Requests referral to a new Endocrinologist, and states that long delay in wait time for the original MD requested Will refer to Dr Renne Crigler Ms. Ludke is reminded of the importance of commitment to daily physical activity for 30 minutes or more, as able and the need to limit carbohydrate intake to 30 to 60 grams per meal to help with blood sugar control.   The need to take medication as prescribed, test blood sugar as directed, and to call between visits if there is a concern that blood sugar is uncontrolled is also discussed.   Jennifer Blackburn is reminded of the importance of daily foot exam, annual eye examination, and good blood sugar, blood pressure and cholesterol control.  Diabetic Labs Latest Ref Rng & Units 08/06/2017 06/14/2017 06/09/2017 07/13/2016 03/23/2016  HbA1c - 8.9 - - - -  Microalbumin Not Estab. ug/mL - 19.5(H) - - -  Micro/Creat Ratio 0.0 - 30.0 mg/g creat - 11.5 - - -  Chol <200 mg/dL - - 191 172 -  HDL >50 mg/dL - - 45(L) 42(L) -  Calc LDL mg/dL (calc) - - 120(H) 100(H) -  Triglycerides <150 mg/dL - - 150(H) 148 -  Creatinine 0.60 - 0.93 mg/dL - - 0.61 0.69 0.78   BP/Weight 10/25/2017 10/04/2017 08/29/2017 06/13/2017 05/27/2017 11/06/7114 07/26/9036  Systolic BP - 333 832 919 166 060 045  Diastolic BP - 70 72 80 70 64 74  Wt. (Lbs) 195 192.8 191 206 202.5 203.6 212  BMI 32.45 32.08 31.78 34.28 33.7 33.88 35.28   Foot/eye exam completion dates Latest Ref Rng & Units 12/26/2015 10/15/2015  Eye Exam No Retinopathy Retinopathy(A) -  Foot exam Order - - -  Foot Form Completion - - Done

## 2017-11-06 NOTE — Assessment & Plan Note (Signed)
Deteriorated. Patient re-educated about  the importance of commitment to a  minimum of 150 minutes of exercise per week.  The importance of healthy food choices with portion control discussed. Encouraged to start a food diary, count calories and to consider  joining a support group. Sample diet sheets offered. Goals set by the patient for the next several months.   Weight /BMI 10/25/2017 10/04/2017 08/29/2017  WEIGHT 195 lb 192 lb 12.8 oz 191 lb  HEIGHT 5\' 5"  - 5\' 5"   BMI 32.45 kg/m2 32.08 kg/m2 31.78 kg/m2

## 2017-11-06 NOTE — Assessment & Plan Note (Signed)
Pt re assured that her perceived leg swelling is really ankle deformity from ostearthritis and that she has normal heart and kidney function

## 2017-11-06 NOTE — Assessment & Plan Note (Signed)
On supplemental iron

## 2017-11-06 NOTE — Assessment & Plan Note (Signed)
Pt on thyroid replacement which is monitored and managed  by endo

## 2017-11-06 NOTE — Assessment & Plan Note (Signed)
Controlled, no change in medication DASH diet and commitment to daily physical activity for a minimum of 30 minutes discussed and encouraged, as a part of hypertension management. The importance of attaining a healthy weight is also discussed.  BP/Weight 10/25/2017 10/04/2017 08/29/2017 06/13/2017 05/27/2017 1/64/3539 03/23/2581  Systolic BP - 462 194 712 527 129 290  Diastolic BP - 70 72 80 70 64 74  Wt. (Lbs) 195 192.8 191 206 202.5 203.6 212  BMI 32.45 32.08 31.78 34.28 33.7 33.88 35.28

## 2017-11-06 NOTE — Assessment & Plan Note (Signed)
Controlled, no change in medication  

## 2017-11-06 NOTE — Assessment & Plan Note (Signed)
Trac edema of oth feet, which is physiologic , and not pathologic, this is explained to patient

## 2017-11-06 NOTE — Assessment & Plan Note (Signed)
Hyperlipidemia:Low fat diet discussed and encouraged.   Lipid Panel  Lab Results  Component Value Date   CHOL 191 06/09/2017   HDL 45 (L) 06/09/2017   LDLCALC 120 (H) 06/09/2017   TRIG 150 (H) 06/09/2017   CHOLHDL 4.2 06/09/2017  uncontrolled , needs to reduce fat in diet and take,medication as prescribed, also needs to increase exercise commitment Updated lab needed at/ before next visit.

## 2017-11-09 ENCOUNTER — Encounter (HOSPITAL_COMMUNITY): Payer: Self-pay

## 2017-11-09 ENCOUNTER — Ambulatory Visit (HOSPITAL_COMMUNITY): Payer: Medicare Other

## 2017-11-09 DIAGNOSIS — G8929 Other chronic pain: Secondary | ICD-10-CM

## 2017-11-09 DIAGNOSIS — M25511 Pain in right shoulder: Secondary | ICD-10-CM | POA: Diagnosis not present

## 2017-11-09 DIAGNOSIS — M25611 Stiffness of right shoulder, not elsewhere classified: Secondary | ICD-10-CM

## 2017-11-09 DIAGNOSIS — R29898 Other symptoms and signs involving the musculoskeletal system: Secondary | ICD-10-CM | POA: Diagnosis not present

## 2017-11-09 NOTE — Therapy (Signed)
[place], [place], [address] Phone: [phone]   Fax:  [phone]  Occupational Therapy Treatment  Patient Details  Name: Jennifer Blackburn MRN: [id number] Date of Birth: 1946/02/24 Referring Provider: Justice Britain, MD   Encounter Date: 11/09/2017  OT End of Session - 11/09/17 1133    Visit Number  4    Number of Visits  5    Date for OT Re-Evaluation  11/16/17    Authorization Type  Primary is Medicare; secondary is [employer] of [facility]; no visit limit    Authorization Time Period  no autorization required    OT Start Time  1120   Pt requested to leave early.   OT Stop Time  1150    OT Time Calculation (min)  30 min    Activity Tolerance  Patient tolerated treatment well    Behavior During Therapy  WFL for tasks assessed/performed       Past Medical History:  Diagnosis Date  . Anxiety   . CAD (coronary artery disease)   . Depression   . Diabetes mellitus type II    without complication  . DJD (degenerative joint disease) of lumbar spine   . Hypercholesteremia   . Hyperlipidemia   . Hypertension    benign   . Thyroid cancer ([place]) 2001    Past Surgical History:  Procedure Laterality Date  . ABDOMINAL HYSTERECTOMY    . CARDIAC CATHETERIZATION    . CATARACT EXTRACTION W/PHACO Right 07/17/2012   Procedure: CATARACT EXTRACTION PHACO AND INTRAOCULAR LENS PLACEMENT (IOC);  Surgeon: Tonny Branch, MD;  Location: [facility];  Service: Ophthalmology;  Laterality: Right;  CDE:25.51  . CATARACT EXTRACTION W/PHACO Left 09/17/2016   Procedure: CATARACT EXTRACTION PHACO AND INTRAOCULAR LENS PLACEMENT LEFT EYE;  Surgeon: Tonny Branch, MD;  Location: [facility];  Service: Ophthalmology;  Laterality: Left;  CDE: 19.23  . COLONOSCOPY    . COLONOSCOPY N/A 01/15/2014   Procedure: COLONOSCOPY;  Surgeon: Daneil Dolin, MD;  Location: [facility];  Service: Endoscopy;  Laterality: N/A;  9:00 AM  . DOPPLER ECHOCARDIOGRAPHY    .  JOINT REPLACEMENT  07/01/2010   left hip  . KNEE SURGERY Right    arthroscopy  . left hip replaced  07/01/2010  . SPINE SURGERY  2006   cervical  . stress dipyridamole myocardial perfusion    . THYROIDECTOMY    . TONSILLECTOMY    . VESICOVAGINAL FISTULA CLOSURE W/ TAH      There were no vitals filed for this visit.  Subjective Assessment - 11/09/17 1134    Subjective   S: I reached up the other day overhead and it scared me and took my breathe away because it hurt. I may have moved too quickly.     Currently in Pain?  No/denies         [facility] OT Assessment - 11/09/17 1135      Assessment   Medical Diagnosis  right adhesive capsulitis      Precautions   Precautions  None               OT Treatments/Exercises (OP) - 11/09/17 1135      Exercises   Exercises  Shoulder      Shoulder Exercises: Supine   Protraction  PROM;5 reps;Strengthening;10 reps    Protraction Weight (lbs)  2    Horizontal ABduction  PROM;5 reps;Strengthening;10 reps    Horizontal ABduction Weight (lbs)  2  External Rotation  PROM;5 reps;Strengthening;10 reps    External Rotation Weight (lbs)  2    Internal Rotation  PROM;5 reps;Strengthening;10 reps    Internal Rotation Weight (lbs)  2    Flexion  PROM;5 reps;Strengthening;10 reps    Shoulder Flexion Weight (lbs)  2    ABduction  PROM;5 reps;Strengthening;10 reps    Shoulder ABduction Weight (lbs)  2      Shoulder Exercises: ROM/Strengthening   UBE (Upper Arm Bike)  level 1 2' reverse 2' forward    X to V Arms  10X 2#      Shoulder Exercises: Stretch   Wall Stretch - Flexion  2 reps;30 seconds      Manual Therapy   Manual Therapy  Soft tissue mobilization    Manual therapy comments  Manual therapy completed prior to exercises.     Soft tissue mobilization  Myofascial release and manual stretching completed to right upper arm, trapezius, and scapularis region to decrease fascial restrictions and increase joint mobility and range of  motion during functional tasks.                OT Short Term Goals - 10/26/17 1137      OT SHORT TERM GOAL #1   Title  Patient will be educated and independent with HEP to faciliate progress in therapy and allow her to return to using her RUE as her dominant extremity for all daily tasks.    Time  4    Period  Weeks    Status  On-going      OT SHORT TERM GOAL #2   Title  Patient will increase RUE strength to 4+/5 in order to complete daily tasks and be able to complete light lifting tasks.    Time  4    Period  Weeks    Status  On-going      OT SHORT TERM GOAL #3   Title  Patient will increase RUE A/ROM to [facility] to increase ability to complete dressing tasks with dominant extremity with less difficulty.      Time  4    Period  Weeks    Status  On-going      OT SHORT TERM GOAL #4   Title  Patient will decrease pain level in RUE during daily tasks to 4/10 or less.      Time  4    Period  Weeks    Status  On-going      OT SHORT TERM GOAL #5   Title  Patient will decrease fascial restrictions in RUE to min amount or less in order to increase functional mobility needed to complete functional reaching tasks.      Time  4    Period  Weeks    Status  On-going      OT SHORT TERM GOAL #6   Title  Patient will increase grip strength to 10# and pinch strength to 3# in order to be able to hold onto light weight items and open containers with less difficulty.    Time  4    Period  Weeks    Status  On-going               Plan - 11/09/17 1136    Clinical Impression Statement  A: Increased to 2# supine, added X to V arms, and UBE bike for shoulder and scapular strengthening. patient required VC for form and technique throughout session. pt required increased time to complete X to  V arms.     Plan  P: Reassesment and discharge if all goals have been met. Update HEP if needed.     Consulted and Agree with Plan of Care  Patient       Patient will benefit from skilled  therapeutic intervention in order to improve the following deficits and impairments:  Decreased range of motion, Decreased endurance, Impaired sensation, Decreased activity tolerance, Increased fascial restrictions, Impaired UE functional use, Pain, Decreased strength  Visit Diagnosis: Stiffness of right shoulder, not elsewhere classified  Other symptoms and signs involving the musculoskeletal system  Chronic right shoulder pain  Right hand weakness    Problem List Patient Active Problem List   Diagnosis Date Noted  . Cervical spondylosis with radiculopathy 06/20/2017  . Hypertension   . Hyperlipidemia   . Hypercholesteremia   . Depression   . CAD (coronary artery disease)   . Anxiety   . Malignant tumor of thyroid gland ([place]) 02/17/2017  . Menopausal symptom 02/17/2017  . Osteoarthritis 02/17/2017  . Family history of coronary artery disease in father 02/17/2017  . Lumbar spondylosis with myelopathy 07/18/2016  . Laryngopharyngeal reflux (LPR) 06/24/2016  . Type 2 diabetes mellitus with vascular disease ([place]) 01/13/2015  . GERD (gastroesophageal reflux disease) 04/11/2014  . At high risk for falls 12/31/2013  . Postsurgical hypothyroidism 01/05/2013  . Snoring 01/05/2013  . Bilateral leg edema 09/02/2012  . Anxiety and depression 10/16/2011  . Anemia 03/30/2011  . Vitamin D deficiency 10/14/2010  . Obesity with body mass index greater than 30 03/30/2008  . Allergic rhinitis 11/23/2007  . Diabetes mellitus type 2 in obese ([place]) 04/14/2007  . Dyslipidemia 04/14/2007  . CARPAL TUNNEL SYNDROME, BILATERAL 04/14/2007  . Essential hypertension 04/14/2007  . Coronary atherosclerosis 04/14/2007  . Headache 04/14/2007  . Thyroid cancer [facility]) 03/23/1999   Ailene Ravel, OTR/L,CBIS  [phone]  11/09/2017, 12:33 PM  [place] [address] [place], [place], [address] Phone: [phone]   Fax:  [phone]  Name: Jennifer Blackburn MRN: [id number] Date of Birth: [birth year]

## 2017-11-15 ENCOUNTER — Other Ambulatory Visit: Payer: Self-pay | Admitting: Family Medicine

## 2017-11-16 ENCOUNTER — Encounter (HOSPITAL_COMMUNITY): Payer: Medicare Other | Admitting: Occupational Therapy

## 2017-11-16 ENCOUNTER — Encounter: Payer: Self-pay | Admitting: Nutrition

## 2017-11-17 ENCOUNTER — Ambulatory Visit (HOSPITAL_COMMUNITY): Payer: Medicare Other

## 2017-11-22 ENCOUNTER — Telehealth (HOSPITAL_COMMUNITY): Payer: Self-pay | Admitting: Family Medicine

## 2017-11-22 ENCOUNTER — Ambulatory Visit (HOSPITAL_COMMUNITY): Payer: Medicare Other

## 2017-11-22 DIAGNOSIS — E1165 Type 2 diabetes mellitus with hyperglycemia: Secondary | ICD-10-CM | POA: Diagnosis not present

## 2017-11-22 DIAGNOSIS — E039 Hypothyroidism, unspecified: Secondary | ICD-10-CM | POA: Diagnosis not present

## 2017-11-22 DIAGNOSIS — E1159 Type 2 diabetes mellitus with other circulatory complications: Secondary | ICD-10-CM | POA: Diagnosis not present

## 2017-11-22 DIAGNOSIS — Z6833 Body mass index (BMI) 33.0-33.9, adult: Secondary | ICD-10-CM | POA: Diagnosis not present

## 2017-11-22 DIAGNOSIS — E785 Hyperlipidemia, unspecified: Secondary | ICD-10-CM | POA: Diagnosis not present

## 2017-11-22 DIAGNOSIS — E118 Type 2 diabetes mellitus with unspecified complications: Secondary | ICD-10-CM | POA: Diagnosis not present

## 2017-11-22 DIAGNOSIS — E6609 Other obesity due to excess calories: Secondary | ICD-10-CM | POA: Diagnosis not present

## 2017-11-22 DIAGNOSIS — I10 Essential (primary) hypertension: Secondary | ICD-10-CM | POA: Diagnosis not present

## 2017-11-22 DIAGNOSIS — C73 Malignant neoplasm of thyroid gland: Secondary | ICD-10-CM | POA: Diagnosis not present

## 2017-11-22 DIAGNOSIS — D509 Iron deficiency anemia, unspecified: Secondary | ICD-10-CM | POA: Diagnosis not present

## 2017-11-22 NOTE — Telephone Encounter (Signed)
11/22/17  has a 1:30 appointment in [place] and can't come here today... RS this appt for 9/9

## 2017-11-23 ENCOUNTER — Ambulatory Visit (HOSPITAL_COMMUNITY)
Admission: RE | Admit: 2017-11-23 | Discharge: 2017-11-23 | Disposition: A | Discharge status: Home / Self Care | Payer: Medicare Other | Source: Ambulatory Visit | Attending: Family Medicine | Admitting: Family Medicine

## 2017-11-23 DIAGNOSIS — Z1231 Encounter for screening mammogram for malignant neoplasm of breast: Secondary | ICD-10-CM | POA: Diagnosis not present

## 2017-11-28 ENCOUNTER — Encounter (HOSPITAL_COMMUNITY): Payer: Self-pay | Admitting: Specialist

## 2017-11-28 ENCOUNTER — Ambulatory Visit (HOSPITAL_COMMUNITY): Payer: Medicare Other | Attending: Orthopedic Surgery | Admitting: Specialist

## 2017-11-28 DIAGNOSIS — G8929 Other chronic pain: Secondary | ICD-10-CM | POA: Diagnosis not present

## 2017-11-28 DIAGNOSIS — M25511 Pain in right shoulder: Secondary | ICD-10-CM | POA: Insufficient documentation

## 2017-11-28 DIAGNOSIS — M25611 Stiffness of right shoulder, not elsewhere classified: Secondary | ICD-10-CM

## 2017-11-28 DIAGNOSIS — R29898 Other symptoms and signs involving the musculoskeletal system: Secondary | ICD-10-CM

## 2017-11-28 NOTE — Patient Instructions (Signed)
Complete each stretch 2-3 times each, 3 times per day, holding each stretch for 15 to 30 seconds.   Flexion    Stand with hands clasped over head. Extend arms upward as far as possible. Hold ____ seconds. Repeat ____ times. Do ____ sessions per day.  Copyright  VHI. All rights reserved.  Walk Up Exercise (Active/Assistive)    With elbow straight, use fingers to "crawl" up wall or door frame as far as possible. Hold ____ seconds. Repeat ____ times. Do ____ sessions per day.  Copyright  VHI. All rights reserved.  SHOULDER: Flexion At Wall    Slide both arms up wall while leaning gently into wall. Maintain upright posture and tuck in stomach. Hold ___ seconds. ___ reps per set, ___ sets per day, ___ days per week  Copyright  VHI. All rights reserved.  Internal Rotation    Stand with hands clasped behind back. Pull elbows back as far as possible. Hold ____ seconds. Repeat ____ times. Do ____ sessions per day.  Copyright  VHI. All rights reserved.  Internal Rotation (Active)    Bring hand behind back and across to other side. Repeat ____ times. Do ____ sessions per day. Activity: Use this motion when getting dressed to tuck shirttail or fasten brassiere.  Copyright  VHI. All rights reserved.  Shoulder Internal Rotation    Standing, feet shoulder width apart, grasp club with one hand palm forward, arm extended above head, and other hand palm back behind back, arm bent elbow down. Pull gently upward. Hold ____ seconds. Switch arms and repeat. Repeat ____ times. Do ____ sessions per day.  Copyright  VHI. All rights reserved.

## 2017-11-28 NOTE — Therapy (Signed)
[place], [place], [address] Phone: [phone]   Fax:  [phone]  Occupational Therapy Treatment  Patient Details  Name: Jennifer Blackburn MRN: [id number] Date of Birth: 1946/03/18 Referring Provider: Lennette Bihari SUpple   Encounter Date: 11/28/2017  OT End of Session - 11/28/17 2206    Visit Number  5    Number of Visits  5    Date for OT Re-Evaluation  11/28/17    Authorization Type  Primary is Medicare; secondary is [employer] of [facility]; no visit limit    Authorization Time Period  no autorization required    OT Start Time  0950    OT Stop Time  1023    OT Time Calculation (min)  33 min    Activity Tolerance  Patient tolerated treatment well    Behavior During Therapy  [facility] for tasks assessed/performed       Past Medical History:  Diagnosis Date  . Anxiety   . CAD (coronary artery disease)   . Depression   . Diabetes mellitus type II    without complication  . DJD (degenerative joint disease) of lumbar spine   . Hypercholesteremia   . Hyperlipidemia   . Hypertension    benign   . Thyroid cancer ([place]) 2001    Past Surgical History:  Procedure Laterality Date  . ABDOMINAL HYSTERECTOMY    . CARDIAC CATHETERIZATION    . CATARACT EXTRACTION W/PHACO Right 07/17/2012   Procedure: CATARACT EXTRACTION PHACO AND INTRAOCULAR LENS PLACEMENT (IOC);  Surgeon: Tonny Branch, MD;  Location: [facility];  Service: Ophthalmology;  Laterality: Right;  CDE:25.51  . CATARACT EXTRACTION W/PHACO Left 09/17/2016   Procedure: CATARACT EXTRACTION PHACO AND INTRAOCULAR LENS PLACEMENT LEFT EYE;  Surgeon: Tonny Branch, MD;  Location: [facility];  Service: Ophthalmology;  Laterality: Left;  CDE: 19.23  . COLONOSCOPY    . COLONOSCOPY N/A 01/15/2014   Procedure: COLONOSCOPY;  Surgeon: Daneil Dolin, MD;  Location: [facility];  Service: Endoscopy;  Laterality: N/A;  9:00 AM  . DOPPLER ECHOCARDIOGRAPHY    . JOINT REPLACEMENT  07/01/2010   left  hip  . KNEE SURGERY Right    arthroscopy  . left hip replaced  07/01/2010  . SPINE SURGERY  2006   cervical  . stress dipyridamole myocardial perfusion    . THYROIDECTOMY    . TONSILLECTOMY    . VESICOVAGINAL FISTULA CLOSURE W/ TAH      There were no vitals filed for this visit.  Subjective Assessment - 11/28/17 2205    Subjective   S:  Its feeling much better, I think I can do the rest of this on my own.      Currently in Pain?  No/denies         [facility] OT Assessment - 11/28/17 0001      Assessment   Medical Diagnosis  right adhesive capsulitis    Referring Provider  Lennette Bihari SUpple    Next MD Visit  12/07/17      Precautions   Precautions  None      Restrictions   Weight Bearing Restrictions  No      ADL   ADL comments  no longer difficult to reach overhead.  Continues to have difficulty with reaching behind her back to fasten her bra.      Palpation   Palpation comment  minimal fascial restrictions       AROM   Overall AROM Comments  assessed  seated; IR/er adducted    Right Shoulder Flexion  150 Degrees   120   Right Shoulder ABduction  160 Degrees   105   Right Shoulder Internal Rotation  81 Degrees   75   Right Shoulder External Rotation  75 Degrees   70     PROM   Overall PROM Comments  WFL in supine      Strength   Right Shoulder Flexion  4-/5   3+/5   Right Shoulder ABduction  4-/5   3+/5   Right Shoulder Internal Rotation  4+/5   4/5   Right Shoulder External Rotation  4+/5   4-/5     Hand Function   Right Hand Grip (lbs)  35   21   Right Hand Lateral Pinch  12 lbs   9              OT Treatments/Exercises (OP) - 11/28/17 0001      Manual Therapy   Manual Therapy  Myofascial release    Manual therapy comments  Manual therapy completed prior to exercises.     Soft tissue mobilization  Myofascial release and manual stretching completed to right upper arm, trapezius, and scapularis region to decrease fascial restrictions and increase  joint mobility and range of motion during functional tasks.              OT Education - 11/28/17 2206    Education Details  Educated on shoulder stretches, focusing on flexion and internal rotation    Person(s) Educated  Patient    Methods  Explanation;Handout;Demonstration    Comprehension  Verbalized understanding;Returned demonstration       OT Short Term Goals - 11/28/17 1007      OT SHORT TERM GOAL #1   Title  Patient will be educated and independent with HEP to faciliate progress in therapy and allow her to return to using her RUE as her dominant extremity for all daily tasks.    Time  4    Period  Weeks    Status  Achieved      OT SHORT TERM GOAL #2   Title  Patient will increase RUE strength to 4+/5 in order to complete daily tasks and be able to complete light lifting tasks.    Time  4    Period  Weeks    Status  Achieved      OT SHORT TERM GOAL #3   Title  Patient will increase RUE A/ROM to [facility] to increase ability to complete dressing tasks with dominant extremity with less difficulty.      Time  4    Period  Weeks    Status  Achieved      OT SHORT TERM GOAL #4   Title  Patient will decrease pain level in RUE during daily tasks to 4/10 or less.      Time  4    Period  Weeks    Status  Achieved      OT SHORT TERM GOAL #5   Title  Patient will decrease fascial restrictions in RUE to min amount or less in order to increase functional mobility needed to complete functional reaching tasks.      Time  4    Period  Weeks    Status  Achieved      OT SHORT TERM GOAL #6   Title  Patient will increase grip strength to 10# and pinch strength to 3# in order to be able to  hold onto light weight items and open containers with less difficulty.    Time  4    Period  Weeks    Status  On-going               Plan - 11/28/17 2208    Clinical Impression Statement  A:  Patient has signficantly improved her range of motion and strength in her right shoulder  region, with flexion lacking 5 degrees of range of motion from her left arm.  Paitent states she no longer feels pain when reaching overhead.  Her primary deficit is her inability to reach her back to fasten her bra.  Patient was educated on shoulder stretches to focus on end range flexion and internal rotation needed for functional activities suich as fastening her bra and reaching overhead.  Patient is pleased with her current status and is requesting dc and will continue with a HEP for end range range of motion of flexion and internal rotation.      Plan  P:  DC from skilled OT intervention with HEP for end range shoulder mobility.     Consulted and Agree with Plan of Care  Patient       Patient will benefit from skilled therapeutic intervention in order to improve the following deficits and impairments:  Decreased range of motion, Decreased endurance, Impaired sensation, Decreased activity tolerance, Increased fascial restrictions, Impaired UE functional use, Pain, Decreased strength  Visit Diagnosis: Stiffness of right shoulder, not elsewhere classified - Plan: Ot plan of care cert/re-cert  Other symptoms and signs involving the musculoskeletal system - Plan: Ot plan of care cert/re-cert  Chronic right shoulder pain - Plan: Ot plan of care cert/re-cert  Right hand weakness - Plan: Ot plan of care cert/re-cert    Problem List Patient Active Problem List   Diagnosis Date Noted  . Cervical spondylosis with radiculopathy 06/20/2017  . Hypertension   . Hyperlipidemia   . Hypercholesteremia   . Depression   . CAD (coronary artery disease)   . Anxiety   . Malignant tumor of thyroid gland ([place]) 02/17/2017  . Menopausal symptom 02/17/2017  . Osteoarthritis 02/17/2017  . Family history of coronary artery disease in father 02/17/2017  . Lumbar spondylosis with myelopathy 07/18/2016  . Laryngopharyngeal reflux (LPR) 06/24/2016  . Type 2 diabetes mellitus with vascular disease ([place])  01/13/2015  . GERD (gastroesophageal reflux disease) 04/11/2014  . At high risk for falls 12/31/2013  . Postsurgical hypothyroidism 01/05/2013  . Snoring 01/05/2013  . Bilateral leg edema 09/02/2012  . Anxiety and depression 10/16/2011  . Anemia 03/30/2011  . Vitamin D deficiency 10/14/2010  . Obesity with body mass index greater than 30 03/30/2008  . Allergic rhinitis 11/23/2007  . Diabetes mellitus type 2 in obese ([place]) 04/14/2007  . Dyslipidemia 04/14/2007  . CARPAL TUNNEL SYNDROME, BILATERAL 04/14/2007  . Essential hypertension 04/14/2007  . Coronary atherosclerosis 04/14/2007  . Headache 04/14/2007  . Thyroid cancer ([place]) 03/23/1999  OCCUPATIONAL THERAPY DISCHARGE SUMMARY  Visits from Start of Care: 5  Current functional level related to goals / functional outcomes: See above Remaining deficits: See above   Education / Equipment: See above Plan: Patient agrees to discharge.  Patient goals were met. Patient is being discharged due to meeting the stated rehab goals.  ?????          Vangie Bicker, [place], OTR/L [phone]  11/28/2017, 10:18 PM  [place], [place], [address] Phone:  [phone]   Fax:  [phone]  Name: FATE GALANTI MRN: [id number] Date of Birth: 05-Feb-1946

## 2017-11-29 ENCOUNTER — Ambulatory Visit: Payer: Medicare Other | Admitting: Family Medicine

## 2017-11-30 DIAGNOSIS — M7501 Adhesive capsulitis of right shoulder: Secondary | ICD-10-CM | POA: Diagnosis not present

## 2017-12-01 ENCOUNTER — Ambulatory Visit: Payer: Medicare Other | Admitting: Nutrition

## 2017-12-16 DIAGNOSIS — E1142 Type 2 diabetes mellitus with diabetic polyneuropathy: Secondary | ICD-10-CM | POA: Diagnosis not present

## 2017-12-16 DIAGNOSIS — M2040 Other hammer toe(s) (acquired), unspecified foot: Secondary | ICD-10-CM | POA: Diagnosis not present

## 2017-12-16 DIAGNOSIS — L851 Acquired keratosis [keratoderma] palmaris et plantaris: Secondary | ICD-10-CM | POA: Diagnosis not present

## 2017-12-21 ENCOUNTER — Ambulatory Visit: Payer: Medicare Other | Admitting: Nutrition

## 2017-12-22 ENCOUNTER — Ambulatory Visit: Payer: Medicare Other | Admitting: Family Medicine

## 2018-01-10 ENCOUNTER — Encounter: Payer: Self-pay | Admitting: Family Medicine

## 2018-01-10 ENCOUNTER — Ambulatory Visit (INDEPENDENT_AMBULATORY_CARE_PROVIDER_SITE_OTHER): Payer: Medicare Other | Admitting: Family Medicine

## 2018-01-10 VITALS — BP 116/60 | HR 64 | Resp 12 | Ht 65.0 in | Wt 191.0 lb

## 2018-01-10 DIAGNOSIS — F3289 Other specified depressive episodes: Secondary | ICD-10-CM

## 2018-01-10 DIAGNOSIS — Z23 Encounter for immunization: Secondary | ICD-10-CM | POA: Diagnosis not present

## 2018-01-10 DIAGNOSIS — E669 Obesity, unspecified: Secondary | ICD-10-CM

## 2018-01-10 DIAGNOSIS — I10 Essential (primary) hypertension: Secondary | ICD-10-CM | POA: Diagnosis not present

## 2018-01-10 DIAGNOSIS — E1159 Type 2 diabetes mellitus with other circulatory complications: Secondary | ICD-10-CM

## 2018-01-10 DIAGNOSIS — E782 Mixed hyperlipidemia: Secondary | ICD-10-CM | POA: Diagnosis not present

## 2018-01-10 NOTE — Progress Notes (Signed)
Jennifer Blackburn     MRN: [id number]      DOB: [birth year]   HPI Jennifer Blackburn is here for follow up and re-evaluation of chronic medical conditions, medication management and review of any available recent lab and radiology data.  Preventive health is updated, specifically  Cancer screening and Immunization.   Questions or concerns regarding consultations or procedures which the PT has had in the interim are  addressed. The PT denies any adverse reactions to current medications since the last visit.  Has seen ENT and being managed for GERD without meds just reductioon of the excessive caffeine she usually has she has seen improvementb Shoulder PT has gone veery well  Has upcoming new endo appt, med cost is a major issue and fADFBG around 160 stretching her med  ROS Denies recent fever or chills. Denies sinus pressure, nasal congestion, ear pain or sore throat. Denies chest congestion, productive cough or wheezing. Denies chest pains, palpitations and leg swelling Denies abdominal pain, nausea, vomiting,diarrhea or constipation.   Denies dysuria, frequency, hesitancy or incontinence. Denies joint pain, swelling and limitation in mobility. Denies headaches, seizures, numbness, or tingling. Denies uncontrolled depression, anxiety or insomnia. Denies skin break down or rash. Denies polyuria, polydipsia, blurred vision , or hypoglycemic episodes. Still challenged with medication cost   PE  BP 116/60 (BP Location: Left Arm, Patient Position: Sitting, Cuff Size: Normal)   Pulse 64   Resp 12   Ht 5\' 5"  (1.651 m)   Wt 191 lb (86.6 kg)   LMP 11/11/2016   HC 65" (165.1 cm)   SpO2 98% Comment: room air  BMI 31.78 kg/m    Patient alert and oriented and in no cardiopulmonary distress.  HEENT: No facial asymmetry, EOMI,   oropharynx pink and moist.  Neck supple no JVD, no mass.  Chest: Clear to auscultation bilaterally.  CVS: S1, S2 no murmurs, no S3.Regular rate.  ABD: Soft non  tender.   Ext: No edema  MS: Adequate ROM spine, shoulders, hips and knees.  Skin: Intact, no ulcerations or rash noted.  Psych: Good eye contact, normal affect. Memory intact not anxious or depressed appearing.  CNS: CN 2-12 intact, power,  normal throughout.no focal deficits noted.   Assessment & Plan  Type 2 diabetes mellitus with vascular disease ([place]) Still uncontrolled and cost a challenge  Hypertension Controlled, no change in medication DASH diet and commitment to daily physical activity for a minimum of 30 minutes discussed and encouraged, as a part of hypertension management. The importance of attaining a healthy weight is also discussed.  BP/Weight 01/10/2018 10/25/2017 10/04/2017 08/29/2017 06/13/2017 05/27/2017 06/12/4008  Systolic BP 272 - 536 644 034 742 595  Diastolic BP 60 - 70 72 80 70 64  Wt. (Lbs) 191 195 192.8 191 206 202.5 203.6  BMI 37.3 32.45 32.08 31.78 34.28 33.7 33.88       Hyperlipidemia Hyperlipidemia:Low fat diet discussed and encouraged.   Lipid Panel  Lab Results  Component Value Date   CHOL 191 06/09/2017   HDL 45 (L) 06/09/2017   LDLCALC 120 (H) 06/09/2017   TRIG 150 (H) 06/09/2017   CHOLHDL 4.2 06/09/2017   Updated lab needed at/ before next visit. Not at goal    Obesity with body mass index greater than 30 Unchanged Patient re-educated about  the importance of commitment to a  minimum of 150 minutes of exercise per week.  The importance of healthy food choices with portion control discussed. Encouraged to start  a food diary, count calories and to consider  joining a support group. Sample diet sheets offered. Goals set by the patient for the next several months.   Weight /BMI 01/10/2018 10/25/2017 10/04/2017  WEIGHT 191 lb 195 lb 192 lb 12.8 oz  HEIGHT 5\' 5"  5\' 5"  5\' 5"   BMI 31.78 kg/m2 32.45 kg/m2 32.08 kg/m2

## 2018-01-10 NOTE — Assessment & Plan Note (Signed)
Still uncontrolled and cost a challenge

## 2018-01-10 NOTE — Patient Instructions (Signed)
F/U in 4.5 to 5 months, call if you need me sooner  Flu vaccine today  Please get fasting lipid, cmp and EGFr and CBC  This week, you need a new order( [facility])  Please schedule your eye exam  It is important that you exercise regularly at least 30 minutes 5 times a week. If you develop chest pain, have severe difficulty breathing, or feel very tired, stop exercising immediately and seek medical attention  Please work on good  health habits so that your health will improve. 1. Commitment to daily physical activity for 30 to 60  minutes, if you are able to do this.  2. Commitment to wise food choices. Aim for half of your  food intake to be vegetable and fruit, one quarter starchy foods, and one quarter protein. Try to eat on a regular schedule  3 meals per day, snacking between meals should be limited to vegetables or fruits or small portions of nuts. 64 ounces of water per day is generally recommended, unless you have specific health conditions, like heart failure or kidney failure where you will need to limit fluid intake.  3. Commitment to sufficient and a  good quality of physical and mental rest daily, generally between 6 to 8 hours per day.  WITH PERSISTANCE AND PERSEVERANCE, THE IMPOSSIBLE , BECOMES THE NORM!

## 2018-01-12 DIAGNOSIS — Z6833 Body mass index (BMI) 33.0-33.9, adult: Secondary | ICD-10-CM | POA: Diagnosis not present

## 2018-01-12 DIAGNOSIS — E1165 Type 2 diabetes mellitus with hyperglycemia: Secondary | ICD-10-CM | POA: Diagnosis not present

## 2018-01-12 DIAGNOSIS — E1141 Type 2 diabetes mellitus with diabetic mononeuropathy: Secondary | ICD-10-CM | POA: Diagnosis not present

## 2018-01-12 DIAGNOSIS — D509 Iron deficiency anemia, unspecified: Secondary | ICD-10-CM | POA: Diagnosis not present

## 2018-01-12 DIAGNOSIS — I10 Essential (primary) hypertension: Secondary | ICD-10-CM | POA: Diagnosis not present

## 2018-01-12 DIAGNOSIS — C73 Malignant neoplasm of thyroid gland: Secondary | ICD-10-CM | POA: Diagnosis not present

## 2018-01-12 DIAGNOSIS — E039 Hypothyroidism, unspecified: Secondary | ICD-10-CM | POA: Diagnosis not present

## 2018-01-12 DIAGNOSIS — E6609 Other obesity due to excess calories: Secondary | ICD-10-CM | POA: Diagnosis not present

## 2018-01-12 DIAGNOSIS — E785 Hyperlipidemia, unspecified: Secondary | ICD-10-CM | POA: Diagnosis not present

## 2018-01-12 DIAGNOSIS — E118 Type 2 diabetes mellitus with unspecified complications: Secondary | ICD-10-CM | POA: Diagnosis not present

## 2018-01-12 DIAGNOSIS — E1159 Type 2 diabetes mellitus with other circulatory complications: Secondary | ICD-10-CM | POA: Diagnosis not present

## 2018-01-15 ENCOUNTER — Encounter: Payer: Self-pay | Admitting: Family Medicine

## 2018-01-15 NOTE — Assessment & Plan Note (Signed)
Controlled, no change in medication DASH diet and commitment to daily physical activity for a minimum of 30 minutes discussed and encouraged, as a part of hypertension management. The importance of attaining a healthy weight is also discussed.  BP/Weight 01/10/2018 10/25/2017 10/04/2017 08/29/2017 06/13/2017 05/27/2017 2/86/7519  Systolic BP 824 - 299 806 999 672 277  Diastolic BP 60 - 70 72 80 70 64  Wt. (Lbs) 191 195 192.8 191 206 202.5 203.6  BMI 37.3 32.45 32.08 31.78 34.28 33.7 33.88

## 2018-01-15 NOTE — Assessment & Plan Note (Signed)
Unchanged Patient re-educated about  the importance of commitment to a  minimum of 150 minutes of exercise per week.  The importance of healthy food choices with portion control discussed. Encouraged to start a food diary, count calories and to consider  joining a support group. Sample diet sheets offered. Goals set by the patient for the next several months.   Weight /BMI 01/10/2018 10/25/2017 10/04/2017  WEIGHT 191 lb 195 lb 192 lb 12.8 oz  HEIGHT 5\' 5"  5\' 5"  5\' 5"   BMI 31.78 kg/m2 32.45 kg/m2 32.08 kg/m2

## 2018-01-15 NOTE — Assessment & Plan Note (Signed)
Controlled, no change in medication  

## 2018-01-15 NOTE — Assessment & Plan Note (Signed)
Hyperlipidemia:Low fat diet discussed and encouraged.   Lipid Panel  Lab Results  Component Value Date   CHOL 191 06/09/2017   HDL 45 (L) 06/09/2017   LDLCALC 120 (H) 06/09/2017   TRIG 150 (H) 06/09/2017   CHOLHDL 4.2 06/09/2017   Updated lab needed at/ before next visit. Not at goal

## 2018-01-17 ENCOUNTER — Ambulatory Visit: Payer: Medicare Other | Admitting: Internal Medicine

## 2018-01-18 DIAGNOSIS — E785 Hyperlipidemia, unspecified: Secondary | ICD-10-CM | POA: Diagnosis not present

## 2018-01-18 DIAGNOSIS — E1169 Type 2 diabetes mellitus with other specified complication: Secondary | ICD-10-CM | POA: Diagnosis not present

## 2018-01-18 DIAGNOSIS — E669 Obesity, unspecified: Secondary | ICD-10-CM | POA: Diagnosis not present

## 2018-01-18 LAB — CBC
HCT: 36.2 % (ref 35.0–45.0)
Hemoglobin: 11.6 g/dL — ABNORMAL LOW (ref 11.7–15.5)
MCH: 26.3 pg — ABNORMAL LOW (ref 27.0–33.0)
MCHC: 32 g/dL (ref 32.0–36.0)
MCV: 82.1 fL (ref 80.0–100.0)
MPV: 10.5 fL (ref 7.5–12.5)
Platelets: 275 10*3/uL (ref 140–400)
RBC: 4.41 10*6/uL (ref 3.80–5.10)
RDW: 12.7 % (ref 11.0–15.0)
WBC: 6 10*3/uL (ref 3.8–10.8)

## 2018-01-18 LAB — COMPLETE METABOLIC PANEL WITH GFR
AG Ratio: 1.4 (calc) (ref 1.0–2.5)
ALT: 14 U/L (ref 6–29)
AST: 17 U/L (ref 10–35)
Albumin: 4.2 g/dL (ref 3.6–5.1)
Alkaline phosphatase (APISO): 43 U/L (ref 33–130)
BUN: 11 mg/dL (ref 7–25)
CO2: 30 mmol/L (ref 20–32)
Calcium: 9 mg/dL (ref 8.6–10.4)
Chloride: 99 mmol/L (ref 98–110)
Creat: 0.79 mg/dL (ref 0.60–0.93)
GFR, Est African American: 86 mL/min/{1.73_m2} (ref >=60)
GFR, Est Non African American: 74 mL/min/{1.73_m2} (ref >=60)
Globulin: 2.9 g/dL (calc) (ref 1.9–3.7)
Glucose, Bld: 180 mg/dL — ABNORMAL HIGH (ref 65–99)
Potassium: 4.8 mmol/L (ref 3.5–5.3)
Sodium: 138 mmol/L (ref 135–146)
Total Bilirubin: 0.3 mg/dL (ref 0.2–1.2)
Total Protein: 7.1 g/dL (ref 6.1–8.1)

## 2018-01-18 LAB — LIPID PANEL
Cholesterol: 167 mg/dL (ref <200)
HDL: 45 mg/dL — ABNORMAL LOW (ref >50)
LDL Cholesterol (Calc): 100 mg/dL (calc) — ABNORMAL HIGH
Non-HDL Cholesterol (Calc): 122 mg/dL (calc) (ref <130)
Total CHOL/HDL Ratio: 3.7 (calc) (ref <5.0)
Triglycerides: 127 mg/dL (ref <150)

## 2018-01-19 ENCOUNTER — Encounter: Payer: Self-pay | Admitting: Family Medicine

## 2018-01-23 ENCOUNTER — Other Ambulatory Visit: Payer: Self-pay | Admitting: Cardiology

## 2018-01-24 ENCOUNTER — Ambulatory Visit: Payer: Medicare Other | Admitting: Internal Medicine

## 2018-01-24 NOTE — Telephone Encounter (Signed)
Please advise if patient needs to continue Imdur 30 mg. Last OV with Dr. Gwenlyn Found on 04/05/17 he did not state whether patient needs to continue medication. Patient was started on medication on 02/17/17 on your OV. No further f/u on schedule.

## 2018-02-06 ENCOUNTER — Encounter: Payer: Medicare Other | Attending: Family Medicine | Admitting: Nutrition

## 2018-02-06 VITALS — Ht 65.0 in | Wt 189.0 lb

## 2018-02-06 DIAGNOSIS — Z713 Dietary counseling and surveillance: Secondary | ICD-10-CM | POA: Diagnosis not present

## 2018-02-06 DIAGNOSIS — Z6832 Body mass index (BMI) 32.0-32.9, adult: Secondary | ICD-10-CM | POA: Diagnosis not present

## 2018-02-06 DIAGNOSIS — E118 Type 2 diabetes mellitus with unspecified complications: Secondary | ICD-10-CM

## 2018-02-06 DIAGNOSIS — E119 Type 2 diabetes mellitus without complications: Secondary | ICD-10-CM | POA: Insufficient documentation

## 2018-02-06 DIAGNOSIS — E669 Obesity, unspecified: Secondary | ICD-10-CM | POA: Diagnosis not present

## 2018-02-06 DIAGNOSIS — IMO0002 Reserved for concepts with insufficient information to code with codable children: Secondary | ICD-10-CM

## 2018-02-06 DIAGNOSIS — E1165 Type 2 diabetes mellitus with hyperglycemia: Secondary | ICD-10-CM

## 2018-02-06 NOTE — Progress Notes (Signed)
Medical Nutrition Therapy:  Appt start time: 1330 end time:  1400  Assessment:  Primary concerns today: Diabetes Type 2, Obesity. She lives with her husband. She cooks and shops. Her husband brings a lot of foods in the house. She admits to struggling with unhealthy snacks..  Had thryroid cancer.  Had DM for 20 yrs. A1C 8.9% in May. Will get it done with new ENDO, Dr. Cruzita Lederer Choosing healthier foods. 55 units Toujeo and Metformin 1000  mg BID, Glipizide.  Changing Endo MD's to Dr. Cruzita Lederer in [place]. Will see her in a few weeks. Working on making changes slowly but realizes she has to reduce her outside commitments with organizations and take more time for herself and work on improving her DM.  She tends to stay up late and sleep in the morning which throws off her meal and medication schedule. Not drinking much water.  Lab Results  Component Value Date   HGBA1C 8.9 08/06/2017   CMP Latest Ref Rng & Units 01/18/2018 06/09/2017 07/13/2016  Glucose 65 - 99 mg/dL 180(H) 177(H) 156(H)  BUN 7 - 25 mg/dL 11 10 11   Creatinine 0.60 - 0.93 mg/dL 0.79 0.61 0.69  Sodium 135 - 146 mmol/L 138 140 139  Potassium 3.5 - 5.3 mmol/L 4.8 4.1 4.4  Chloride 98 - 110 mmol/L 99 103 102  CO2 20 - 32 mmol/L 30 32 28  Calcium 8.6 - 10.4 mg/dL 9.0 8.8 8.7  Total Protein 6.1 - 8.1 g/dL 7.1 7.1 7.1  Total Bilirubin 0.2 - 1.2 mg/dL 0.3 0.3 0.3  Alkaline Phos 33 - 130 U/L - - 33  AST 10 - 35 U/L 17 15 17   ALT 6 - 29 U/L 14 15 15    Lipid Panel     Component Value Date/Time   CHOL 167 01/18/2018 1001   TRIG 127 01/18/2018 1001   HDL 45 (L) 01/18/2018 1001   CHOLHDL 3.7 01/18/2018 1001   VLDL 30 07/13/2016 1020   LDLCALC 100 (H) 01/18/2018 1001    Not exercising.  Preferred Learning Style:   No preference indicated    Learning Readiness:    Contemplating   MEDICATIONS: see list   DIETARY INTAKE:  24-hr recall:  B (10  AM): oatmeal, toast, milk coffee, or bacon,egg and low calorie toast. Snk (  AM):  L ( PM): tomato sandwich, sunsweet  tea Snk ( PM):  D ( PM):Cube stead, fried squash and potatoes, 1 biscuit and water Snk ( PM): 1/2 c applesauce and 2 saltine crackers.  Beverages: water, unsweet tea  Usual physical activity: ADL  Estimated energy needs: 1200  calories 135 g carbohydrates 90 g protein 33 g fat  Progress Towards Goal(s):  In progress.   Nutritional Diagnosis:  NB-1.1 Food and nutrition-related knowledge deficit As related to Diabetes and Obesity.  As evidenced by A1C 8.9  and BMI 32    Intervention:  Nutrition and Diabetes education provided on My Plate, CHO counting, meal planning, portion sizes, timing of meals, avoiding snacks between meals unless having a low blood sugar, target ranges for A1C and blood sugars, signs/symptoms and treatment of hyper/hypoglycemia, monitoring blood sugars, taking medications as prescribed, benefits of exercising 30 minutes per day and prevention of complications of DM. Marland Kitchen Goals 1. Focus more on fresh fruits and vegetables. 2. Cut out snacks 3. Lose 6 lbs in 3 months Try not to eat past 7 pm Get A1C to 7.5% Drink 5-6 bottles per day Teaching Method Utilized:  Visual Auditory  Hands on  Handouts given during visit include:  The Plate Method   Meal Plan Card   Barriers to learning/adherence to lifestyle change: None   Demonstrated degree of understanding via:  Teach Back   Monitoring/Evaluation:  Dietary intake, exercise, meal planning , and body weight in 3 month(s). She may benefit from stopping GLipizide and consider meal time insulin for better blood sugar control.

## 2018-02-06 NOTE — Patient Instructions (Addendum)
Goals 1. Focus more on fresh fruits and vegetables. 2. Cut out snacks 3. Lose 6 lbs in 3 months Try not to eat past 7 pm Get A1C to 7.5% Drink 5-6 bottles per day

## 2018-02-07 ENCOUNTER — Encounter: Payer: Self-pay | Admitting: Internal Medicine

## 2018-02-07 ENCOUNTER — Ambulatory Visit (INDEPENDENT_AMBULATORY_CARE_PROVIDER_SITE_OTHER): Payer: Medicare Other | Admitting: Internal Medicine

## 2018-02-07 ENCOUNTER — Encounter: Payer: Self-pay | Admitting: Nutrition

## 2018-02-07 VITALS — BP 118/60 | HR 67 | Ht 65.0 in | Wt 188.0 lb

## 2018-02-07 DIAGNOSIS — E1159 Type 2 diabetes mellitus with other circulatory complications: Secondary | ICD-10-CM

## 2018-02-07 DIAGNOSIS — E890 Postprocedural hypothyroidism: Secondary | ICD-10-CM | POA: Diagnosis not present

## 2018-02-07 DIAGNOSIS — C73 Malignant neoplasm of thyroid gland: Secondary | ICD-10-CM

## 2018-02-07 LAB — POCT GLYCOSYLATED HEMOGLOBIN (HGB A1C): Hemoglobin A1C: 8 % — AB (ref 4.0–5.6)

## 2018-02-07 MED ORDER — SEMAGLUTIDE(0.25 OR 0.5MG/DOS) 2 MG/1.5ML ~~LOC~~ SOPN: 0.5000 mg | PEN_INJECTOR | SUBCUTANEOUS | 5 refills | Status: DC

## 2018-02-07 NOTE — Patient Instructions (Addendum)
Please continue: - Metformin 1000 mg 2x a day with meals.  Please decrease:  - Toujeo to 30 units in am and 50 units at bedtime  Please start Ozempic 0.25 mg weekly in a.m. (for example on Sunday morning) x 4 weeks, then increase to 0.5 mg weekly in a.m. if no nausea or hypoglycemia.  Please let me know if the sugars are consistently <80 or >200.  Please return in 3 months with your sugar log.   PATIENT INSTRUCTIONS FOR TYPE 2 DIABETES:  **Please join MyChart!** - see attached instructions about how to join if you have not done so already.  DIET AND EXERCISE Diet and exercise is an important part of diabetic treatment.  We recommended aerobic exercise in the form of brisk walking (working between 40-60% of maximal aerobic capacity, similar to brisk walking) for 150 minutes per week (such as 30 minutes five days per week) along with 3 times per week performing 'resistance' training (using various gauge rubber tubes with handles) 5-10 exercises involving the major muscle groups (upper body, lower body and core) performing 10-15 repetitions (or near fatigue) each exercise. Start at half the above goal but build slowly to reach the above goals. If limited by weight, joint pain, or disability, we recommend daily walking in a swimming pool with water up to waist to reduce pressure from joints while allow for adequate exercise.    BLOOD GLUCOSES Monitoring your blood glucoses is important for continued management of your diabetes. Please check your blood glucoses 2-4 times a day: fasting, before meals and at bedtime (you can rotate these measurements - e.g. one day check before the 3 meals, the next day check before 2 of the meals and before bedtime, etc.).   HYPOGLYCEMIA (low blood sugar) Hypoglycemia is usually a reaction to not eating, exercising, or taking too much insulin/ other diabetes drugs.  Symptoms include tremors, sweating, hunger, confusion, headache, etc. Treat IMMEDIATELY with 15  grams of Carbs: . 4 glucose tablets .  cup regular juice/soda . 2 tablespoons raisins . 4 teaspoons sugar . 1 tablespoon honey Recheck blood glucose in 15 mins and repeat above if still symptomatic/blood glucose <100.  RECOMMENDATIONS TO REDUCE YOUR RISK OF DIABETIC COMPLICATIONS: * Take your prescribed MEDICATION(S) * Follow a DIABETIC diet: Complex carbs, fiber rich foods, (monounsaturated and polyunsaturated) fats * AVOID saturated/trans fats, high fat foods, >2,300 mg salt per day. * EXERCISE at least 5 times a week for 30 minutes or preferably daily.  * DO NOT SMOKE OR DRINK more than 1 drink a day. * Check your FEET every day. Do not wear tightfitting shoes. Contact us if you develop an ulcer * See your EYE doctor once a year or more if needed * Get a FLU shot once a year * Get a PNEUMONIA vaccine once before and once after age 11 years  GOALS:  * Your Hemoglobin A1c of <7%  * fasting sugars need to be <130 * after meals sugars need to be <180 (2h after you start eating) * Your Systolic BP should be 147 or lower  * Your Diastolic BP should be 80 or lower  * Your HDL (Good Cholesterol) should be 40 or higher  * Your LDL (Bad Cholesterol) should be 100 or lower. * Your Triglycerides should be 150 or lower  * Your Urine microalbumin (kidney function) should be <30 * Your Body Mass Index should be 25 or lower    Please consider the following ways to cut down  carbs and fat and increase fiber and micronutrients in your diet: - substitute whole grain for white bread or pasta - substitute brown rice for white rice - substitute 90-calorie flat bread pieces for slices of bread when possible - substitute sweet potatoes or yams for white potatoes - substitute humus for margarine - substitute tofu for cheese when possible - substitute almond or rice milk for regular milk (would not drink soy milk daily due to concern for soy estrogen influence on breast cancer risk) - substitute  dark chocolate for other sweets when possible - substitute water - can add lemon or orange slices for taste - for diet sodas (artificial sweeteners will trick your body that you can eat sweets without getting calories and will lead you to overeating and weight gain in the long run) - do not skip breakfast or other meals (this will slow down the metabolism and will result in more weight gain over time)  - can try smoothies made from fruit and almond/rice milk in am instead of regular breakfast - can also try old-fashioned (not instant) oatmeal made with almond/rice milk in am - order the dressing on the side when eating salad at a restaurant (pour less than half of the dressing on the salad) - eat as little meat as possible - can try juicing, but should not forget that juicing will get rid of the fiber, so would alternate with eating raw veg./fruits or drinking smoothies - use as little oil as possible, even when using olive oil - can dress a salad with a mix of balsamic vinegar and lemon juice, for e.g. - use agave nectar, stevia sugar, or regular sugar rather than artificial sweateners - steam or broil/roast veggies  - snack on veggies/fruit/nuts (unsalted, preferably) when possible, rather than processed foods - reduce or eliminate aspartame in diet (it is in diet sodas, chewing gum, etc) Read the labels!  Try to read Dr. Janene Harvey book: "Program for Reversing Diabetes" for other ideas for healthy eating.

## 2018-02-07 NOTE — Progress Notes (Addendum)
Patient ID: Jennifer Blackburn, female   DOB: 11/09/1945, 72 y.o.   MRN: [id number]   HPI: Jennifer Blackburn is a 72 y.o.-year-old female, referred by her PCP, Dr. Moshe Cipro, for management of DM2, dx in 2001, insulin-dependent since 2017, uncontrolled, with long-term complications (DR, CAD).  Unfortunately, she cannot afford her medicines as she is in the donut hole now >> stretching doses, uses samples.  DM2: Reviewed latest HbA1c level: 01/12/2018: HbA1c 8.7% Lab Results  Component Value Date   HGBA1C 8.9 08/06/2017   HGBA1C 8.0 10/20/2015   HGBA1C 9.0 (H) 11/14/2014   HGBA1C 9.0 (H) 07/10/2014   HGBA1C 9.2 (H) 12/28/2013   HGBA1C 8.0 (H) 07/30/2013   HGBA1C 9.1 (H) 05/01/2013   HGBA1C 8.0 (H) 09/04/2012   HGBA1C 9.4 (H) 04/03/2012   HGBA1C 7.7 (H) 10/15/2011   Pt is on a regimen of: - Metformin 1000 mg 2x a day, with meals - Toujeo 50 units 2x a day - increased 3 weeks ago She was on Victoza >> $$$. She was on Jardiance >> $$$. She was on Actos >> swelling - stopped Spring 2019. She was on Glipizide XL 5 mg daily >> stopped 07/2017.  Pt checks her sugars 1-2x a day and they are: - am: 150-180 - 2h after b'fast: n/c - before lunch: n/c - 2h after lunch: n/c - before dinner: n/c - 2h after dinner: n/c - bedtime: 198-210, 320 after eating out - nighttime: n/c Lowest sugar was 110; she has hypoglycemia awareness at 60.  Highest sugar was 320.  Glucometer: AccuChek Aviva  Pt's meals are: - Breakfast:toast, cereal, milk; oatmeal; bacon + egg - Lunch: skips  - Dinner: hamburger, or chicken, or fish, + veggies, bread, water and tea - Snacks: 2- fruit Sees dietitian in [place] -saw her yesterday for the second time..  - no CKD, last BUN/creatinine:  Lab Results  Component Value Date   BUN 11 01/18/2018   BUN 10 06/09/2017   CREATININE 0.79 01/18/2018   CREATININE 0.61 06/09/2017  On Quinapril 10.  - last set of lipids: Lab Results  Component Value Date   CHOL  167 01/18/2018   HDL 45 (L) 01/18/2018   LDLCALC 100 (H) 01/18/2018   TRIG 127 01/18/2018   CHOLHDL 3.7 01/18/2018  On Pravastatin 80.  - last eye exam was in 12/2016: + DR. Dr. Eulas Post in [place].  - no numbness and tingling in her feet. Has a podiatrist.   On ASA 81.  Pt has no FH of DM.  She also has a distant h/o Thyroid cancer (2001), s/p RAI tx, now with postsurgical hypothyroidism. - She cannot remember details about her thyroid cancer but she seems to remember that it was papillary type - She was followed with ultrasounds and thyroglobulin levels and these have been normal, reportedly (we will try to obtain records). - on LT4 DAW 200 mcg daily - dose not changed in last year.  Latest TSH normal: Lab Results  Component Value Date   TSH 2.49 03/23/2016   TSH 0.070 (L) 10/13/2010   TSH 0.035 (L) 09/22/2006   TSH 0.015 (L) 06/17/2006   She sees Dr. Ronnald Collum but would like to switch to see me for her thyroid disease and her diabetes.  She also has a history of HTN, anemia.  ROS: Constitutional: no weight gain, no weight loss, no fatigue, no subjective hyperthermia, no subjective hypothermia, no nocturia Eyes: no blurry vision, no xerophthalmia ENT: no sore throat, no nodules palpated in  neck, no dysphagia, no odynophagia, no hoarseness, no tinnitus, no hypoacusis Cardiovascular: no CP, no SOB, no palpitations, no leg swelling Respiratory: no cough, no SOB, no wheezing Gastrointestinal: no N, no V, no D, no C, no acid reflux Musculoskeletal: + Muscle aches, + joint aches Skin: no rash, + hair loss Neurological: no tremors, no numbness or tingling/no dizziness/+ hAs Psychiatric: no depression, no anxiety  Past Medical History:  Diagnosis Date  . Anxiety   . CAD (coronary artery disease)   . Depression   . Diabetes mellitus type II    without complication  . DJD (degenerative joint disease) of lumbar spine   . Hypercholesteremia   . Hyperlipidemia   .  Hypertension    benign   . Thyroid cancer ([place]) 2001   Past Surgical History:  Procedure Laterality Date  . ABDOMINAL HYSTERECTOMY    . CARDIAC CATHETERIZATION    . CATARACT EXTRACTION W/PHACO Right 07/17/2012   Procedure: CATARACT EXTRACTION PHACO AND INTRAOCULAR LENS PLACEMENT (IOC);  Surgeon: Tonny Branch, MD;  Location: [facility];  Service: Ophthalmology;  Laterality: Right;  CDE:25.51  . CATARACT EXTRACTION W/PHACO Left 09/17/2016   Procedure: CATARACT EXTRACTION PHACO AND INTRAOCULAR LENS PLACEMENT LEFT EYE;  Surgeon: Tonny Branch, MD;  Location: [facility];  Service: Ophthalmology;  Laterality: Left;  CDE: 19.23  . COLONOSCOPY    . COLONOSCOPY N/A 01/15/2014   Procedure: COLONOSCOPY;  Surgeon: Daneil Dolin, MD;  Location: [facility] SUITE;  Service: Endoscopy;  Laterality: N/A;  9:00 AM  . DOPPLER ECHOCARDIOGRAPHY    . JOINT REPLACEMENT  07/01/2010   left hip  . KNEE SURGERY Right    arthroscopy  . left hip replaced  07/01/2010  . SPINE SURGERY  2006   cervical  . stress dipyridamole myocardial perfusion    . THYROIDECTOMY    . TONSILLECTOMY    . VESICOVAGINAL FISTULA CLOSURE W/ TAH     Social History   Socioeconomic History  . Marital status: Married    Spouse name: Not on file  . Number of children: 2  . Years of education: 72  . Highest education level: Not on file  Occupational History  . Occupation: retired     Comment: Company secretary  . Financial resource strain: Not on file  . Food insecurity:    Worry: Not on file    Inability: Not on file  . Transportation needs:    Medical: Not on file    Non-medical: Not on file  Tobacco Use  . Smoking status: Passive Smoke Exposure - Never Smoker  . Smokeless tobacco: Never Used  . Tobacco comment: Husband smokes in the home  Substance and Sexual Activity  . Alcohol use: No  . Drug use: No  . Sexual activity: Never  Lifestyle  . Physical activity:    Days per week: Not on file    Minutes per session: Not on file  .  Stress: Not on file  Relationships  . Social connections:    Talks on phone: Not on file    Gets together: Not on file    Attends religious service: Not on file    Active member of club or organization: Not on file    Attends meetings of clubs or organizations: Not on file    Relationship status: Not on file  . Intimate partner violence:    Fear of current or ex partner: Not on file    Emotionally abused: Not on file  Physically abused: Not on file    Forced sexual activity: Not on file  Other Topics Concern  . Not on file  Social History Narrative   Lives with husband, at home   No caffeine   Current Outpatient Medications on File Prior to Visit  Medication Sig Dispense Refill  . aspirin EC 81 MG tablet Take 1 tablet (81 mg total) by mouth daily.    . ferrous sulfate 325 (65 FE) MG tablet Take 325 mg by mouth daily with breakfast.    . fluticasone (FLONASE) 50 MCG/ACT nasal spray Place 2 sprays into both nostrils daily. 16 g 6  . gabapentin (NEURONTIN) 100 MG capsule TAKE 1 CAPSULE(100 MG) BY MOUTH THREE TIMES DAILY (Patient taking differently: Take 100 mg by mouth at bedtime. ) 270 capsule 1  . glucose blood (ACCU-CHEK AVIVA PLUS) test strip Use as instructed four times daily dx E11.65 150 each 5  . imipramine (TOFRANIL) 25 MG tablet TAKE 4 TABLETS BY MOUTH EVERY NIGHT AT BEDTIME 120 tablet 0  . Insulin Glargine (TOUJEO SOLOSTAR [place]) Inject 50 Units into the skin 2 (two) times daily.    . isosorbide mononitrate (IMDUR) 30 MG 24 hr tablet TAKE 1/2 TABLET(15 MG) BY MOUTH DAILY 45 tablet 0  . Lancets (ACCU-CHEK MULTICLIX) lancets Use as instructed three times daily dx 250.01 100 each 5  . levothyroxine (SYNTHROID) 200 MCG tablet Take 1 tablet (200 mcg total) by mouth daily before breakfast. 30 tablet 5  . loratadine (CLARITIN) 10 MG tablet Take 10 mg by mouth daily as needed for allergies.     . Menthol, Topical Analgesic, (ICY HOT EX) Apply 1 application topically 3 (three) times  daily as needed (for back pain.).    Marland Kitchen metFORMIN (GLUCOPHAGE) 1000 MG tablet TAKE 1 TABLET TWICE A DAY 180 tablet 1  . metoprolol tartrate (LOPRESSOR) 50 MG tablet TAKE 1 TABLET(50 MG) BY MOUTH TWICE DAILY 60 tablet 11  . Multiple Vitamin (MULTIVITAMIN) tablet Take 1 tablet by mouth daily.    Marland Kitchen PARoxetine (PAXIL) 20 MG tablet Take 1 tablet (20 mg total) by mouth daily. (Patient taking differently: Take 20 mg by mouth daily. Patient reports that she takes it 1-2 times week.) 90 tablet 3  . pravastatin (PRAVACHOL) 80 MG tablet TAKE 1 TABLET DAILY 90 tablet 1  . quinapril (ACCUPRIL) 10 MG tablet TAKE 1 TABLET DAILY 90 tablet 1  . verapamil (CALAN-SR) 180 MG CR tablet TAKE 1 TABLET(180 MG) BY MOUTH AT BEDTIME 30 tablet 11  . vitamin B-12 (CYANOCOBALAMIN) 1000 MCG tablet Take 1 tablet (1,000 mcg total) by mouth daily. 30 tablet 5  . VITAMIN D, CHOLECALCIFEROL, PO Take 1 capsule by mouth daily.     No current facility-administered medications on file prior to visit.    Allergies  Allergen Reactions  . Daypro [Oxaprozin] Hives  . Sulfonamide Derivatives Hives   Family History  Problem Relation Age of Onset  . Heart attack Father   . Heart failure Mother   . Asthma Daughter   . Sleep apnea Son        CPAP  . Colon cancer Neg Hx     PE: BP 118/60   Pulse 67   Ht 5\' 5"  (1.651 m) Comment: measured  Wt 188 lb (85.3 kg)   LMP 11/11/2016   BMI 31.28 kg/m  Wt Readings from Last 3 Encounters:  02/07/18 188 lb (85.3 kg)  02/06/18 189 lb (85.7 kg)  01/10/18 191 lb (86.6 kg)  Constitutional: overweight, in NAD Eyes: PERRLA, EOMI, no exophthalmos ENT: moist mucous membranes, no neck masses palpated, thyroidectomy scar healed, no cervical lymphadenopathy Cardiovascular: RRR, No MRG Respiratory: CTA B Gastrointestinal: abdomen soft, NT, ND, BS+ Musculoskeletal: no deformities, strength intact in all 4 Skin: moist, warm, no rashes Neurological: no tremor with outstretched hands, DTR normal  in all 4  ASSESSMENT: 1. DM2, insulin-dependent, uncontrolled, with long-term complications - CAD - DR  2.  Papillary thyroid cancer  3.  Postsurgical hypothyroidism  PLAN:  1. Patient with long-standing, uncontrolled diabetes, on oral antidiabetic regimen + basal insulin, which became insufficient.  At today's visit, an HbA1c is still high, at 8.9%.  She is in the donut hole and cannot afford to go back to Victoza and Jardiance as she was taking earlier this year.  She also was not sure why she needed to take all of these medicines to control her diabetes and wanted to check with me if this is really necessary. -At today's visit we discussed about best way to treat diabetes and I explained that these medicines target different mechanisms of diabetes and they work best if taken together rather than taking higher doses of the same medicines. -At this visit we discussed about improving her diet especially by decreasing fatty foods decrease her insulin resistance.  I also advised her to stop drinking milk. -I suggested to start a GLP-1 receptor agonist (Ozempic), and given her samples for it.  After the first of the year, I hope that we can get this covered by her insurance.  She agrees to start this.  We discussed about benefits and possible side effects.  After starting the medication, approximately a week later, I advised her to try to decrease the Toujeo.  My goal is to decrease Toujeo to just once a day as soon as possible.  For now, we will continue metformin.  After the first of the year, if still needed, we can add back Jardiance, and I explained that this would be helpful not only for her diabetes but also to help her cardiovascular status. - I suggested to:  Patient Instructions  Please continue: - Metformin 1000 mg 2x a day with meals.  Please decrease:  - Toujeo to 30 units in am and 50 units at bedtime  Please start Ozempic 0.25 mg weekly in a.m. (for example on Sunday morning) x 4  weeks, then increase to 0.5 mg weekly in a.m. if no nausea or hypoglycemia.  Please let me know if the sugars are consistently <80 or >200.  Please return in 3 months with your sugar log.   - Strongly advised her to start checking sugars at different times of the day - check 1x a day, rotating checks - discussed about CBG targets for treatment: 80-130 mg/dL before meals and <180 mg/dL after meals; target HbA1c <7%. - given sugar log and advised how to fill it and to bring it at next appt  - given foot care handout and explained the principles  - given instructions for hypoglycemia management "15-15 rule"  - advised for yearly eye exams  - Return to clinic in 3 mo with sugar log   2.  Papillary thyroid cancer -Patient signed a release of information consent form for [place] to obtain records from Dr. Ronnald Collum -At next visit I plan to check her thyroglobulin and ATA and possibly even a thyroid ultrasound  3.  Postsurgical hypothyroidism -Continue levothyroxine 200 mcg daily. -She had an appointment with Dr. Ronnald Collum  3 weeks ago and we will try to get the records. -I plan to check her TFTs at next visit if not checked recently by her previous endocrinologist.  Addendum 03/02/2018: Received records from Dr. Ronnald Collum -these will be scanned.  In summary:  Patient is status post total thyroidectomy in 2004 1.3 cm papillary thyroid cancer of the right lobe, followed by 3 small palpation 33 mCi I-131 ablation and a fourth dose inpatient dose of 125 mCi at [facility].  Whole-body scan on 09/08/2007 showed an increase in the thyroid bed activity and the follow up study with PET scan 10/08/2007 showed mildly hypermetabolic cervical lymph nodes.  Repeat PET 05/10/2008 showed no significant changes felt likely to be due to to a reactive process.  11/03/2017: TSH 0.044, thyroglobulin 0.2, ATA <1.0  08/05/2017: HbA1c 8.9%  07/11/2017: HbA1c 9.1%  04/05/2017:  Glucose 119, BUN/creatinine 15/0.82, GFR 83. Lipids  184/164/51/100 TSH 0.021 (0.45-4.5), thyroglobulin 0.3 (by IMA), ATA <1.0   Philemon Kingdom, MD PhD [facility] Endocrinology

## 2018-02-08 ENCOUNTER — Other Ambulatory Visit: Payer: Self-pay | Admitting: Family Medicine

## 2018-02-09 ENCOUNTER — Telehealth: Payer: Self-pay | Admitting: Internal Medicine

## 2018-02-09 ENCOUNTER — Telehealth: Payer: Self-pay | Admitting: Family Medicine

## 2018-02-09 NOTE — Telephone Encounter (Signed)
Pt would like someone to call her to tell her what kind of thyroid cancer she has.

## 2018-02-09 NOTE — Telephone Encounter (Signed)
Patient called per Dr. Arman Filter question re: type of cancer patient had to make sure patient can take a particular medication. Type of cancer-mulifocal pappillary. Please call patient at ph# [phone].

## 2018-02-09 NOTE — Telephone Encounter (Signed)
Endocrinologist wants to know what kind of thyroid cancer patient had so that they can make sure new medication Ozempic will be ok to take. Patient has the medication, but has not taken it yet.

## 2018-02-09 NOTE — Telephone Encounter (Signed)
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

## 2018-02-10 NOTE — Telephone Encounter (Signed)
Please advise if this is sufficient information or if I need to call patient

## 2018-02-10 NOTE — Telephone Encounter (Signed)
Noted. Good! This is not the one that is a contraindication for the diabetic medicine use.

## 2018-03-03 ENCOUNTER — Telehealth: Payer: Self-pay | Admitting: Family Medicine

## 2018-03-03 ENCOUNTER — Other Ambulatory Visit: Payer: Self-pay

## 2018-03-03 DIAGNOSIS — I10 Essential (primary) hypertension: Secondary | ICD-10-CM

## 2018-03-03 MED ORDER — QUINAPRIL HCL 10 MG PO TABS: 10.0000 mg | ORAL_TABLET | Freq: Every day | ORAL | 1 refills | Status: DC

## 2018-03-03 NOTE — Telephone Encounter (Signed)
Pt needs Medicine sent to [facility]- Quinapril 10mg 

## 2018-03-03 NOTE — Progress Notes (Signed)
Quinapril refilled

## 2018-03-06 DIAGNOSIS — Z01419 Encounter for gynecological examination (general) (routine) without abnormal findings: Secondary | ICD-10-CM | POA: Diagnosis not present

## 2018-03-06 DIAGNOSIS — Z6832 Body mass index (BMI) 32.0-32.9, adult: Secondary | ICD-10-CM | POA: Diagnosis not present

## 2018-03-28 DIAGNOSIS — K760 Fatty (change of) liver, not elsewhere classified: Secondary | ICD-10-CM | POA: Diagnosis not present

## 2018-03-28 DIAGNOSIS — N200 Calculus of kidney: Secondary | ICD-10-CM | POA: Diagnosis not present

## 2018-03-28 DIAGNOSIS — N8184 Pelvic muscle wasting: Secondary | ICD-10-CM | POA: Diagnosis not present

## 2018-03-28 DIAGNOSIS — K8020 Calculus of gallbladder without cholecystitis without obstruction: Secondary | ICD-10-CM | POA: Diagnosis not present

## 2018-03-28 DIAGNOSIS — K5790 Diverticulosis of intestine, part unspecified, without perforation or abscess without bleeding: Secondary | ICD-10-CM | POA: Diagnosis not present

## 2018-04-11 ENCOUNTER — Encounter: Payer: Self-pay | Admitting: Cardiovascular Disease

## 2018-04-11 ENCOUNTER — Ambulatory Visit (INDEPENDENT_AMBULATORY_CARE_PROVIDER_SITE_OTHER): Payer: Medicare Other | Admitting: Cardiovascular Disease

## 2018-04-11 VITALS — BP 146/74 | HR 73 | Ht 65.0 in | Wt 193.6 lb

## 2018-04-11 DIAGNOSIS — I2510 Atherosclerotic heart disease of native coronary artery without angina pectoris: Secondary | ICD-10-CM | POA: Diagnosis not present

## 2018-04-11 DIAGNOSIS — I10 Essential (primary) hypertension: Secondary | ICD-10-CM

## 2018-04-11 DIAGNOSIS — I2583 Coronary atherosclerosis due to lipid rich plaque: Secondary | ICD-10-CM

## 2018-04-11 DIAGNOSIS — E785 Hyperlipidemia, unspecified: Secondary | ICD-10-CM | POA: Diagnosis not present

## 2018-04-11 NOTE — Assessment & Plan Note (Signed)
History of dyslipidemia on statin therapy with lipid profile performed 01/18/2018 revealing a total cholesterol 167, LDL 100 and HDL 45.

## 2018-04-11 NOTE — Patient Instructions (Signed)
Medication Instructions:  NONE If you need a refill on your cardiac medications before your next appointment, please call your pharmacy.   Lab work: NONE If you have labs (blood work) drawn today and your tests are completely normal, you will receive your results only by: . MyChart Message (if you have MyChart) OR . A paper copy in the mail If you have any lab test that is abnormal or we need to change your treatment, we will call you to review the results.  Testing/Procedures: NONE  Follow-Up: At [facility], you and your health needs are our priority.  As part of our continuing mission to provide you with exceptional heart care, we have created designated Provider Care Teams.  These Care Teams include your primary Cardiologist (physician) and Advanced Practice Providers (APPs -  Physician Assistants and Nurse Practitioners) who all work together to provide you with the care you need, when you need it. . You will need a follow up appointment in 12 months.  Please call our office 2 months in advance to schedule this appointment.  You may see Dr. Berry or one of the following Advanced Practice Providers on your designated Care Team:   . Luke Kilroy, PA-C . Hao Meng, PA-C . Angela Duke, PA-C . Kathryn Lawrence, DNP . Rhonda Barrett, PA-C . Krista Kroeger, PA-C . Callie Goodrich, PA-C   

## 2018-04-11 NOTE — Assessment & Plan Note (Signed)
History of essential hypertension her blood pressure measured today at 146/74.  She is on metoprolol and quinapril as well as verapamil.

## 2018-04-11 NOTE — Progress Notes (Signed)
04/11/2018 Jennifer Blackburn   [birth year]  [id number]  Primary Physician Fayrene Helper, MD Primary Cardiologist: Lorretta Harp MD FACP, [place], [place], [place]  HPI:  Jennifer Blackburn is a 73 y.o.  moderately overweight, married [place] American female, mother of 2, grandmother to 4 grandchildren who I last saw in the office  04/05/2017 . She has a history of minimal CAD by cath back in June 2005 with normal LV function. Her other problems include treated hypertension, diabetes and dyslipidemia. She does have a strong family history of heart disease with a father that died of an MI at age 81. Most recent lipid profile performed by Dr. Moshe Cipro on 11/14/14 revealing a total cholesterol 175, LDL of 112 and HDL of 42.. Dr. Moshe Cipro continues to follow her with profile . She has had several episodes of nocturnal chest pressure which has awakened her from sleep with a "heavy sensation on her chest and a feeling of diaphoresis since I last saw her but these are infrequent and have not changed frequency or severity. She did see Kerin Ransom in the office 02/17/17 complaining of chest pain and a Myoview stress test performed 02/23/17 was low risk.   Since I saw her in the office a year ago she is been asymptomatic denying chest pain or shortness of breath.   Current Meds  Medication Sig  . aspirin EC 81 MG tablet Take 1 tablet (81 mg total) by mouth daily.  . ferrous sulfate 325 (65 FE) MG tablet Take 325 mg by mouth daily with breakfast.  . fluticasone (FLONASE) 50 MCG/ACT nasal spray Place 2 sprays into both nostrils daily.  Marland Kitchen gabapentin (NEURONTIN) 100 MG capsule TAKE 1 CAPSULE(100 MG) BY MOUTH THREE TIMES DAILY (Patient taking differently: Take 100 mg by mouth at bedtime. )  . glucose blood (ACCU-CHEK AVIVA PLUS) test strip Use as instructed four times daily dx E11.65  . imipramine (TOFRANIL) 25 MG tablet TAKE 4 TABLETS BY MOUTH EVERY NIGHT AT BEDTIME  . Insulin Glargine (TOUJEO SOLOSTAR [place])  Inject 30-50 Units into the skin 2 (two) times daily.  . isosorbide mononitrate (IMDUR) 30 MG 24 hr tablet TAKE 1/2 TABLET(15 MG) BY MOUTH DAILY  . Lancets (ACCU-CHEK MULTICLIX) lancets Use as instructed three times daily dx 250.01  . levothyroxine (SYNTHROID) 200 MCG tablet Take 1 tablet (200 mcg total) by mouth daily before breakfast.  . loratadine (CLARITIN) 10 MG tablet Take 10 mg by mouth daily as needed for allergies.   . Menthol, Topical Analgesic, (ICY HOT EX) Apply 1 application topically 3 (three) times daily as needed (for back pain.).  Marland Kitchen metFORMIN (GLUCOPHAGE) 1000 MG tablet TAKE 1 TABLET TWICE A DAY  . metoprolol tartrate (LOPRESSOR) 50 MG tablet TAKE 1 TABLET(50 MG) BY MOUTH TWICE DAILY  . Multiple Vitamin (MULTIVITAMIN) tablet Take 1 tablet by mouth daily.  Marland Kitchen PARoxetine (PAXIL) 20 MG tablet Take 1 tablet (20 mg total) by mouth daily. (Patient taking differently: Take 20 mg by mouth daily. Patient reports that she takes it 1-2 times week.)  . pravastatin (PRAVACHOL) 80 MG tablet TAKE 1 TABLET DAILY  . quinapril (ACCUPRIL) 10 MG tablet Take 1 tablet (10 mg total) by mouth daily.  . Semaglutide,0.25 or 0.5MG /DOS, (OZEMPIC, 0.25 OR 0.5 MG/DOSE,) 2 MG/1.5ML SOPN Inject 0.5 mg into the skin once a week.  . verapamil (CALAN-SR) 180 MG CR tablet TAKE 1 TABLET(180 MG) BY MOUTH AT BEDTIME  . vitamin B-12 (CYANOCOBALAMIN) 1000 MCG tablet Take  1 tablet (1,000 mcg total) by mouth daily.  Marland Kitchen VITAMIN D, CHOLECALCIFEROL, PO Take 1 capsule by mouth daily.     Allergies  Allergen Reactions  . Daypro [Oxaprozin] Hives  . Sulfonamide Derivatives Hives    Social History   Socioeconomic History  . Marital status: Married    Spouse name: Not on file  . Number of children: 2  . Years of education: 21  . Highest education level: Not on file  Occupational History  . Occupation: retired     Comment: Company secretary  . Financial resource strain: Not on file  . Food insecurity:    Worry:  Not on file    Inability: Not on file  . Transportation needs:    Medical: Not on file    Non-medical: Not on file  Tobacco Use  . Smoking status: Passive Smoke Exposure - Never Smoker  . Smokeless tobacco: Never Used  . Tobacco comment: Husband smokes in the home  Substance and Sexual Activity  . Alcohol use: No  . Drug use: No  . Sexual activity: Never  Lifestyle  . Physical activity:    Days per week: Not on file    Minutes per session: Not on file  . Stress: Not on file  Relationships  . Social connections:    Talks on phone: Not on file    Gets together: Not on file    Attends religious service: Not on file    Active member of club or organization: Not on file    Attends meetings of clubs or organizations: Not on file    Relationship status: Not on file  . Intimate partner violence:    Fear of current or ex partner: Not on file    Emotionally abused: Not on file    Physically abused: Not on file    Forced sexual activity: Not on file  Other Topics Concern  . Not on file  Social History Narrative   Lives with husband, at home   No caffeine     Review of Systems: General: negative for chills, fever, night sweats or weight changes.  Cardiovascular: negative for chest pain, dyspnea on exertion, edema, orthopnea, palpitations, paroxysmal nocturnal dyspnea or shortness of breath Dermatological: negative for rash Respiratory: negative for cough or wheezing Urologic: negative for hematuria Abdominal: negative for nausea, vomiting, diarrhea, bright red blood per rectum, melena, or hematemesis Neurologic: negative for visual changes, syncope, or dizziness All other systems reviewed and are otherwise negative except as noted above.    Blood pressure (!) 146/74, pulse 73, height 5\' 5"  (1.651 m), weight 193 lb 9.6 oz (87.8 kg), last menstrual period 11/11/2016.  General appearance: alert and no distress Neck: no adenopathy, no carotid bruit, no JVD, supple, symmetrical,  trachea midline and thyroid not enlarged, symmetric, no tenderness/mass/nodules Lungs: clear to auscultation bilaterally Heart: regular rate and rhythm, S1, S2 normal, no murmur, click, rub or gallop Extremities: extremities normal, atraumatic, no cyanosis or edema Pulses: 2+ and symmetric Skin: Skin color, texture, turgor normal. No rashes or lesions Neurologic: Alert and oriented X 3, normal strength and tone. Normal symmetric reflexes. Normal coordination and gait  EKG sinus rhythm at 73 without ST or T wave changes.  I personally reviewed this EKG.  ASSESSMENT AND PLAN:   Dyslipidemia History of dyslipidemia on statin therapy with lipid profile performed 01/18/2018 revealing a total cholesterol 167, LDL 100 and HDL 45.  Essential hypertension History of essential hypertension her blood pressure measured  today at 146/74.  She is on metoprolol and quinapril as well as verapamil.  CAD (coronary artery disease) History of minimal CAD by cath which I performed in June 2005.  Her most recent stress test performed 02/23/2017 was low risk and she is asymptomatic.      Lorretta Harp MD FACP,FACC,FAHA, [facility] 04/11/2018 3:02 PM

## 2018-04-11 NOTE — Assessment & Plan Note (Signed)
History of minimal CAD by cath which I performed in June 2005.  Her most recent stress test performed 02/23/2017 was low risk and she is asymptomatic.

## 2018-04-19 ENCOUNTER — Other Ambulatory Visit: Payer: Self-pay | Admitting: Cardiology

## 2018-05-01 ENCOUNTER — Other Ambulatory Visit: Payer: Self-pay | Admitting: Family Medicine

## 2018-05-03 IMAGING — MR MR CERVICAL SPINE W/O CM
4 of 5 series · 14 of 48 positions shown · non-contrast
Comparison: Plain film cervical spine 09/13/2016 performed at
[HOSPITAL] [REDACTED]. MRI cervical spine 10/24/2006.

CLINICAL DATA: Neck pain radiating into the right arm for 2 months.
History of prior cervical spine surgery.

EXAM:
MRI CERVICAL SPINE WITHOUT CONTRAST
TECHNIQUE: Multiplanar, multisequence MR imaging of the cervical spine was
performed. No intravenous contrast was administered.

[Series 3: T2 · sagittal · 3.0mm · 0.36mm/px · 5 of 13 slices shown (1 of 2)]
[im 1/13]
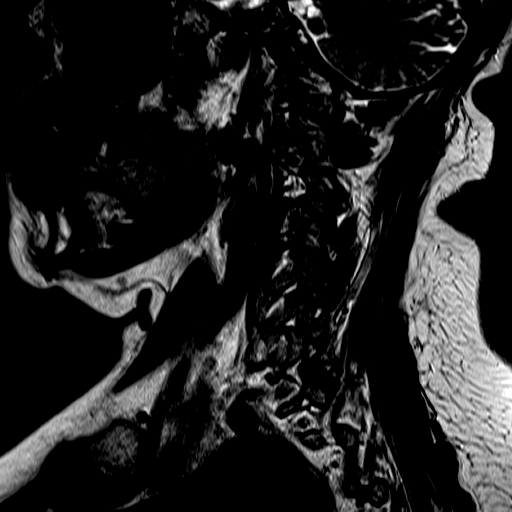
[im 3/13]
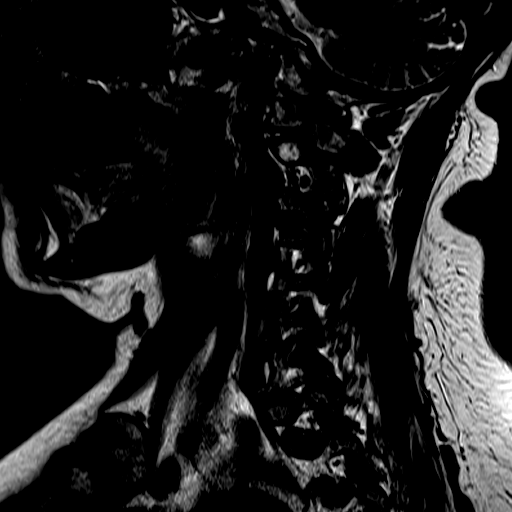
[im 5/13]
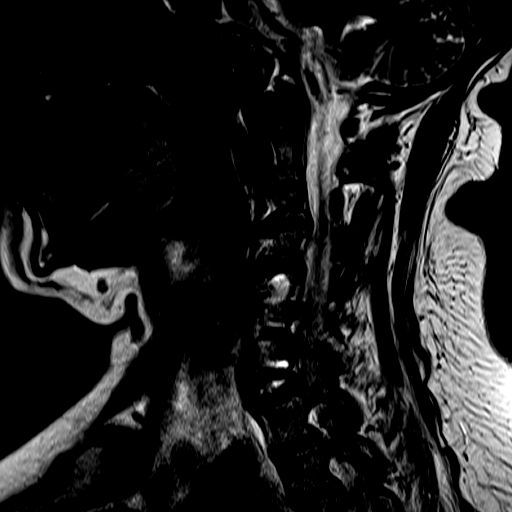
[im 8/13]
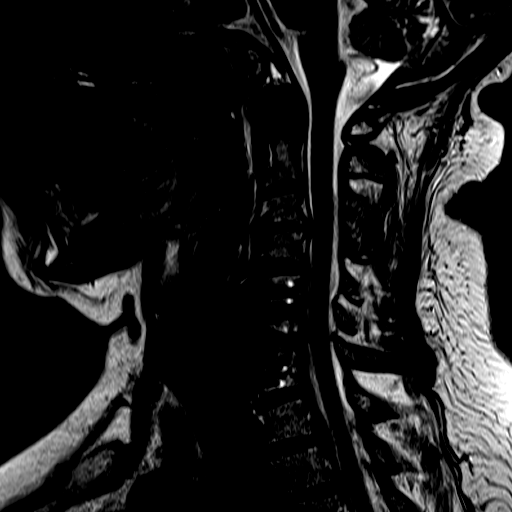
[im 13/13]
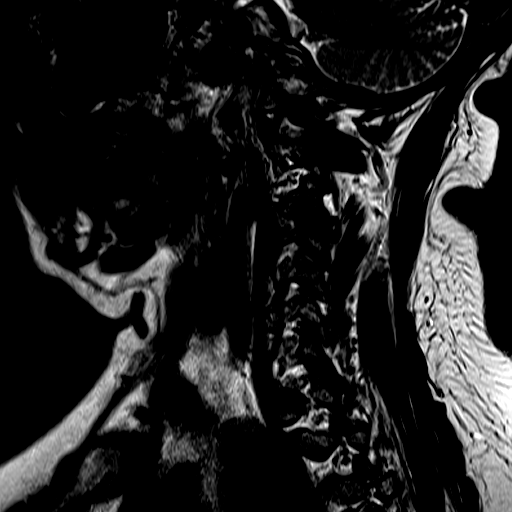

[Series 4: FLAIR · sagittal · 3.0mm · 0.41mm/px · 3 of 13 slices shown]
[im 3/13]
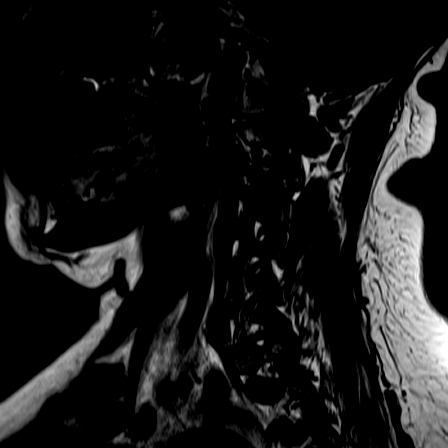
[im 8/13]
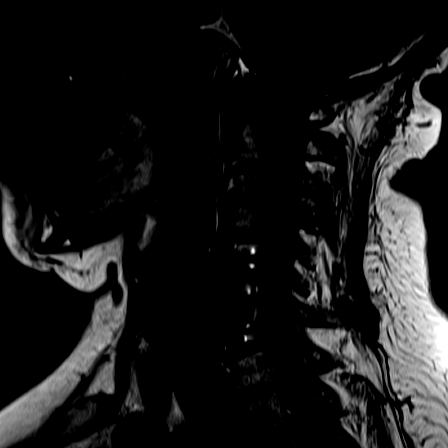
[im 13/13]
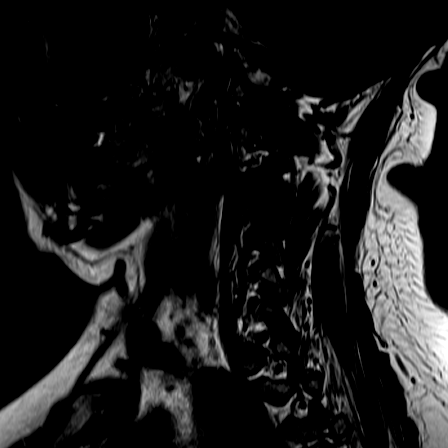

[Series 5: ir sagital · sagittal · 3.0mm · 0.21mm/px · 3 of 13 slices shown]
[im 3/13]
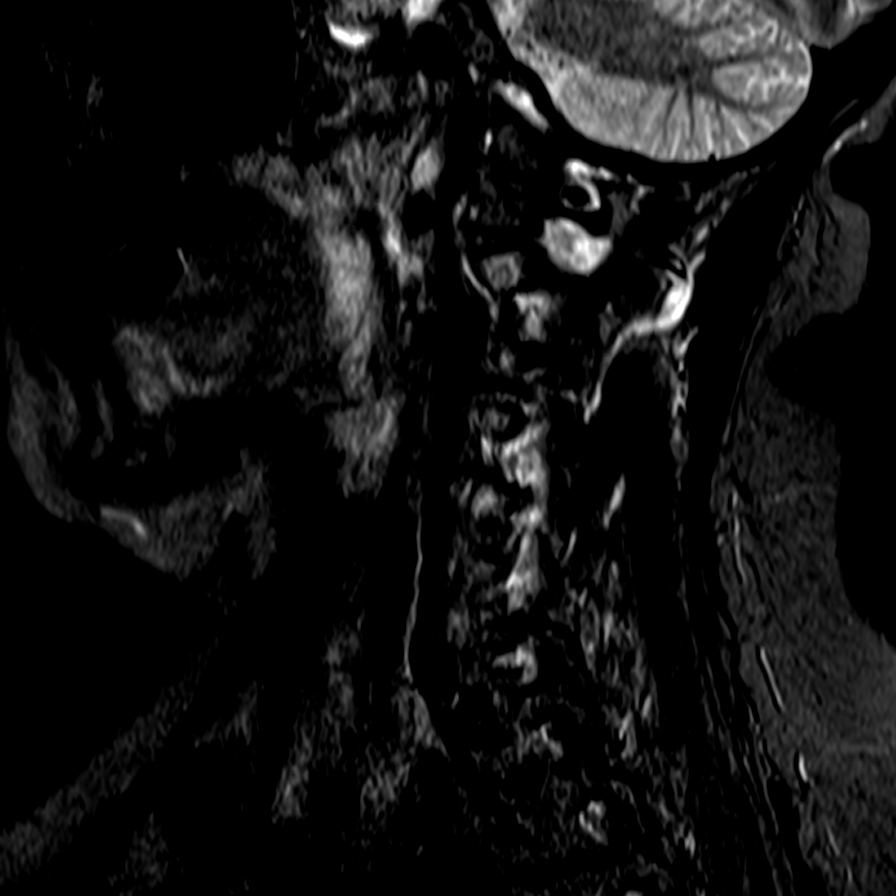
[im 8/13]
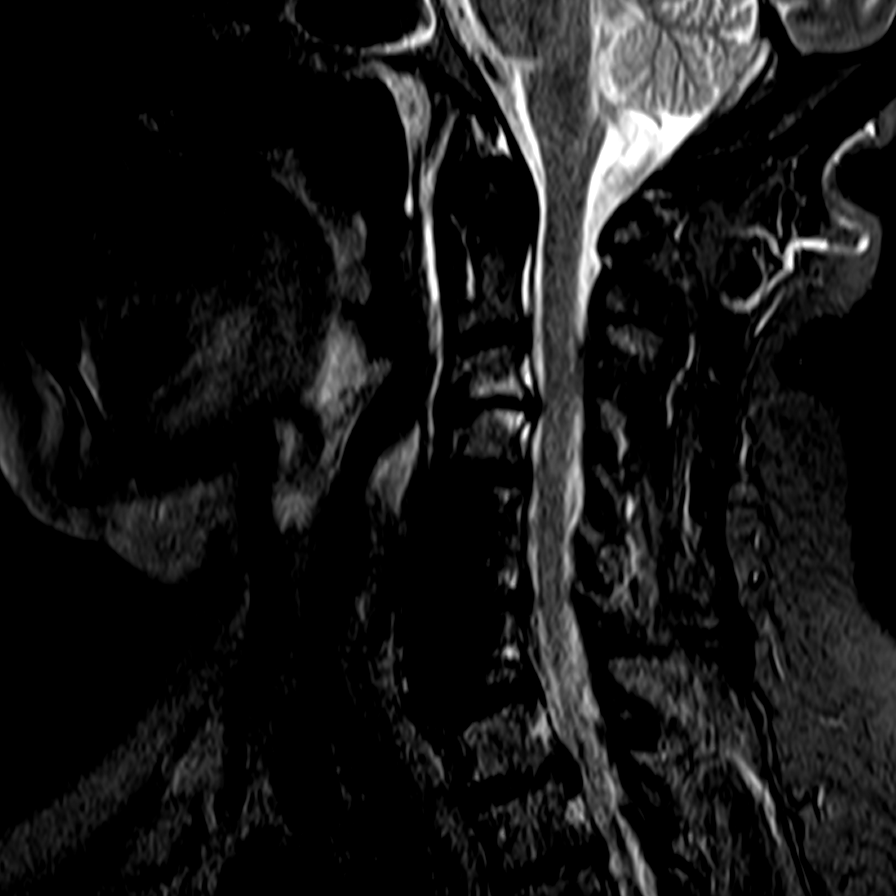
[im 13/13]
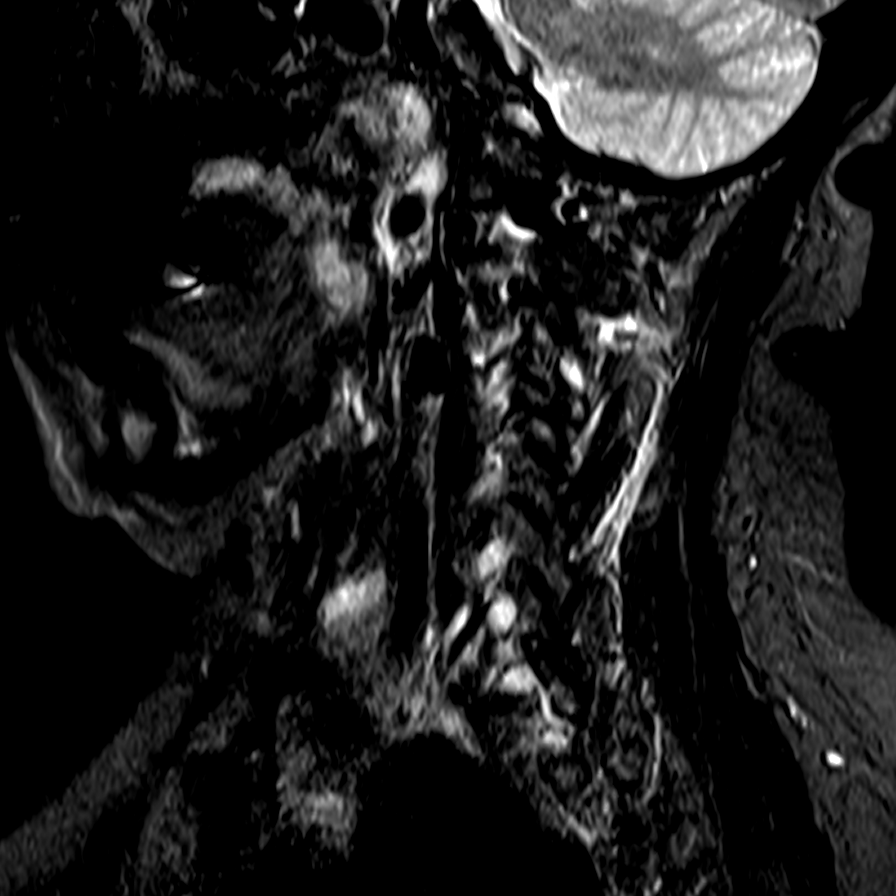

[Series 7: T2 · axial · 3.0mm · 0.22mm/px · z∈[-92,-20]mm · 3 of 32 slices shown (2 of 2)]
[im 5/32]
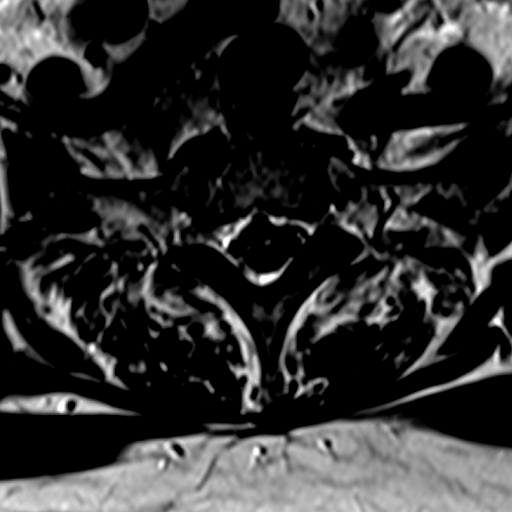
[im 16/32]
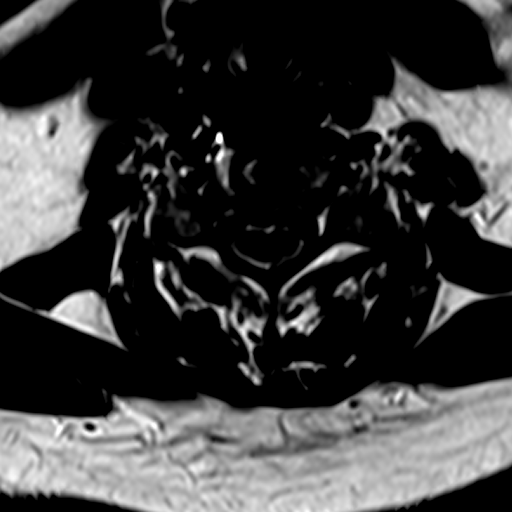
[im 27/32]
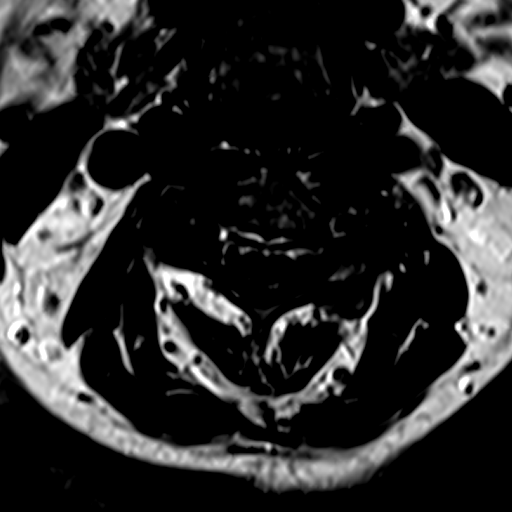

[14 of 48 positions shown; findings below may reference images not displayed]

FINDINGS: Alignment: There is some straightening of the normal cervical
lordosis. No listhesis.

Vertebrae: No fracture or worrisome lesion. Degenerative endplate
signal change C3-4 noted.

Cord: Normal signal throughout.

Posterior Fossa, vertebral arteries, paraspinal tissues: Negative.

Disc levels:

C2-3: Negative.

C3-4: There has been progression of degenerative disease at this
level. Loss of disc space height and a disc osteophyte complex and
uncovertebral spurring are seen. Bilateral facet degenerative change
is worse on the right where there is some marrow edema about the
joint. There is mild deformity of the ventral cord and moderately
severe to severe bilateral foraminal narrowing.

C4-5: Very shallow disc bulge and endplate spurring are identified.
The central canal and foramina are open.

C5-6: Status post discectomy and fusion. The central canal and
foramina are open.

C6-7: Status post discectomy and fusion. The central canal and
foramina are open.

C7-T1: There is a shallow disc bulge and some uncovertebral
spurring. The central canal is open. Mild bilateral foraminal
narrowing is unchanged.

T1-2: Broad-based protrusion with caudal extension is again seen.
The central canal and foramina are open.
IMPRESSION: Since the prior MRI, degenerative change at C3-4 has worsened. The
ventral cord is mildly deformed by disc and endplate spur.
Uncovertebral disease and right worse than left facet degenerative
disease cause moderately severe to severe foraminal narrowing. Mild
marrow edema about the right facet joint is noted.

Shallow disc bulge at C4-5 without central canal or foraminal
stenosis is unchanged.

Status post C5-7 discectomy and fusion. The central canal and
foramina are open.

## 2018-05-09 ENCOUNTER — Other Ambulatory Visit: Payer: Self-pay | Admitting: Cardiovascular Disease

## 2018-05-09 ENCOUNTER — Ambulatory Visit: Payer: Medicare Other | Admitting: Nutrition

## 2018-05-10 NOTE — Telephone Encounter (Signed)
Rx(s) sent to pharmacy electronically.  

## 2018-05-11 ENCOUNTER — Ambulatory Visit: Payer: Medicare Other | Admitting: Nutrition

## 2018-05-17 ENCOUNTER — Telehealth: Payer: Self-pay | Admitting: Family Medicine

## 2018-05-17 NOTE — Telephone Encounter (Signed)
Noted, thanks!

## 2018-05-17 NOTE — Telephone Encounter (Signed)
Called patient to offer appt and discuss Dr.Simpson's message. No answer. Left message letting her know we would need to see her and asked her to please call the office.

## 2018-05-17 NOTE — Telephone Encounter (Signed)
Please work in with Jennifer Blackburn this week if she ha availability, preferably on Thursday as pt may need [place], let her kniow if develops pain and redness and warmth, needs to go to ED

## 2018-05-17 NOTE — Telephone Encounter (Signed)
Left calf pain, not swollen, not warm to touch, hurts up on it, and laying in the bed. Has been going on for a month now. Just concerned.  Please call -- CELL --[phone].

## 2018-05-22 ENCOUNTER — Ambulatory Visit: Payer: Medicare Other | Admitting: Internal Medicine

## 2018-05-23 ENCOUNTER — Other Ambulatory Visit: Payer: Self-pay | Admitting: Family Medicine

## 2018-05-24 LAB — HM DIABETES EYE EXAM

## 2018-05-26 ENCOUNTER — Encounter: Payer: Self-pay | Admitting: Internal Medicine

## 2018-05-26 ENCOUNTER — Ambulatory Visit (INDEPENDENT_AMBULATORY_CARE_PROVIDER_SITE_OTHER): Payer: Medicare Other | Admitting: Internal Medicine

## 2018-05-26 ENCOUNTER — Other Ambulatory Visit: Payer: Self-pay

## 2018-05-26 VITALS — BP 126/70 | HR 68 | Ht 65.0 in | Wt 190.0 lb

## 2018-05-26 DIAGNOSIS — I2510 Atherosclerotic heart disease of native coronary artery without angina pectoris: Secondary | ICD-10-CM | POA: Diagnosis not present

## 2018-05-26 DIAGNOSIS — E1159 Type 2 diabetes mellitus with other circulatory complications: Secondary | ICD-10-CM

## 2018-05-26 DIAGNOSIS — E890 Postprocedural hypothyroidism: Secondary | ICD-10-CM

## 2018-05-26 DIAGNOSIS — C73 Malignant neoplasm of thyroid gland: Secondary | ICD-10-CM | POA: Diagnosis not present

## 2018-05-26 DIAGNOSIS — I2583 Coronary atherosclerosis due to lipid rich plaque: Secondary | ICD-10-CM | POA: Diagnosis not present

## 2018-05-26 LAB — POCT GLYCOSYLATED HEMOGLOBIN (HGB A1C): Hemoglobin A1C: 8.4 % — AB (ref 4.0–5.6)

## 2018-05-26 LAB — TSH: TSH: 0.61 u[IU]/mL (ref 0.35–4.50)

## 2018-05-26 LAB — T4, FREE: Free T4: 1.09 ng/dL (ref 0.60–1.60)

## 2018-05-26 MED ORDER — SEMAGLUTIDE (1 MG/DOSE) 2 MG/1.5ML ~~LOC~~ SOPN: 1.0000 mg | PEN_INJECTOR | SUBCUTANEOUS | 3 refills | Status: DC

## 2018-05-26 NOTE — Patient Instructions (Addendum)
Please continue: - Metformin 1000 mg 2x a day with meals. - Toujeo 30 units in a.m. and 50 units at bedtime  Increase: - Ozempic to 1 mg weekly in a.m.  Please continue levothyroxine 200 mcg daily.  Take the thyroid hormone every day, with water, at least 30 minutes before breakfast, separated by at least 4 hours from: - acid reflux medications - calcium - iron - multivitamins  Please stop at the lab.  Please return in 3 months with your sugar log.

## 2018-05-26 NOTE — Progress Notes (Addendum)
Patient ID: Jennifer Blackburn, female   DOB: 12/14/1945, 73 y.o.   MRN: [id number]   HPI: Jennifer Blackburn is a 72 y.o.-year-old female, returning for follow-up for DM2, dx in 2001, insulin-dependent since 2017, uncontrolled, with long-term complications (DR, CAD) and also papillary thyroid cancer and postsurgical hypothyroidism.  Since last visit, she has been traveling a lot between here and [place] (daughter sick).  Sugars are higher.  DM2: Reviewed latest HbA1c level: Lab Results  Component Value Date   HGBA1C 8.0 (A) 02/07/2018   HGBA1C 8.9 08/06/2017   HGBA1C 8.0 10/20/2015   HGBA1C 9.0 (H) 11/14/2014   HGBA1C 9.0 (H) 07/10/2014   HGBA1C 9.2 (H) 12/28/2013   HGBA1C 8.0 (H) 07/30/2013   HGBA1C 9.1 (H) 05/01/2013   HGBA1C 8.0 (H) 09/04/2012   HGBA1C 9.4 (H) 04/03/2012  01/12/2018: HbA1c 8.7%  Pt is on a regimen of: - Metformin 1000 mg 2x a day with meals. - Toujeo 30 units in a.m. and 50 units at bedtime - Ozempic 0.5 mg weekly in a.m. She was on Victoza >> $$$. She was on Jardiance >> $$$. She was on Actos >> swelling - stopped Spring 2019. She was on Glipizide XL 5 mg daily >> stopped 07/2017.  Pt checks her sugars 1-2 times a day: - am: 150-180 >> 137-163 - 2h after b'fast: n/c - before lunch: n/c - 2h after lunch: n/c - before dinner: n/c - 2h after dinner: n/c - bedtime: 198-210, 320 after eating out >> 230-240 - nighttime: n/c Lowest sugar was 110 >> 137; she has hypoglycemia awareness in the 60s. Highest sugar was 320 >> 240.  Glucometer: AccuChek Aviva  Pt's meals are: - Breakfast:toast, cereal, milk; oatmeal; bacon + egg - Lunch: skips  - Dinner: hamburger, or chicken, or fish, + veggies, bread, water and tea - Snacks: 2- fruit Sees dietitian in [place]. She walks for exercise.   -No CKD, last BUN/creatinine:  Lab Results  Component Value Date   BUN 11 01/18/2018   BUN 10 06/09/2017   CREATININE 0.79 01/18/2018   CREATININE 0.61  06/09/2017  On quinapril 10.  -+ HL; last set of lipids: Lab Results  Component Value Date   CHOL 167 01/18/2018   HDL 45 (L) 01/18/2018   LDLCALC 100 (H) 01/18/2018   TRIG 127 01/18/2018   CHOLHDL 3.7 01/18/2018  On pravastatin 80.  - last eye exam was in 05/24/2018: ?+ DR. Dr. Eulas Post in [place]. + cataract sx in both eyes.  -No numbness and tingling in her feet. Has a podiatrist.   On ASA 81.  Pt has no FH of DM.  She also has a distant h/o Thyroid cancer (2001), s/p RAI tx, now with postsurgical hypothyroidism.  Pt is on levothyroxine 200 mcg daily, taken: - in am - fasting - at least 30 min from b'fast - no Ca, MVI, PPIs - +  Fe 4h later - not on Biotin  Latest TSH suppressed: 11/03/2017: TSH 0.044 Lab Results  Component Value Date   TSH 2.49 03/23/2016   TSH 0.070 (L) 10/13/2010   TSH 0.035 (L) 09/22/2006   TSH 0.015 (L) 06/17/2006   Records per Dr. Ronnald Collum: Patient is status post total thyroidectomy in 2004 1.3 cm papillary thyroid cancer of the right lobe, followed by 3x RAI tx's with  33 mCi I-131 and a fourth RAI dose inpatient: 125 mCi, at [facility].    Whole-body scan on 09/08/2007 showed an increase in the thyroid bed activity and  the follow up study with PET scan 10/08/2007 showed mildly hypermetabolic cervical lymph nodes.    Repeat PET 05/10/2008 showed no significant changes felt likely to be due to to a reactive process.  11/03/2017: TSH 0.044, thyroglobulin 0.2, ATA <1.0  08/05/2017: HbA1c 8.9%  07/11/2017: HbA1c 9.1%  04/05/2017:  Glucose 119, BUN/creatinine 15/0.82, GFR 83. Lipids 184/164/51/100 TSH 0.021 (0.45-4.5), thyroglobulin 0.3 (by IMA), ATA <1.0  She also has a history of HTN, anemia.  ROS: Constitutional: no weight gain/no weight loss, no fatigue, no subjective hyperthermia, no subjective hypothermia Eyes: no blurry vision, no xerophthalmia ENT: no sore throat, no nodules palpated in neck, no dysphagia, no odynophagia, no  hoarseness Cardiovascular: no CP/no SOB/no palpitations/no leg swelling Respiratory: no cough/no SOB/no wheezing Gastrointestinal: no N/no V/no D/+ C/no acid reflux Musculoskeletal: no muscle aches/no joint aches Skin: no rashes, no hair loss Neurological: no tremors/no numbness/no tingling/no dizziness  I reviewed pt's medications, allergies, PMH, social hx, family hx, and changes were documented in the history of present illness. Otherwise, unchanged from my initial visit note.  Past Medical History:  Diagnosis Date  . Anxiety   . CAD (coronary artery disease)   . Depression   . Diabetes mellitus type II    without complication  . DJD (degenerative joint disease) of lumbar spine   . Hypercholesteremia   . Hyperlipidemia   . Hypertension    benign   . Thyroid cancer ([place]) 2001   Past Surgical History:  Procedure Laterality Date  . ABDOMINAL HYSTERECTOMY    . CARDIAC CATHETERIZATION    . CATARACT EXTRACTION W/PHACO Right 07/17/2012   Procedure: CATARACT EXTRACTION PHACO AND INTRAOCULAR LENS PLACEMENT (IOC);  Surgeon: Tonny Branch, MD;  Location: [facility] ORS;  Service: Ophthalmology;  Laterality: Right;  CDE:25.51  . CATARACT EXTRACTION W/PHACO Left 09/17/2016   Procedure: CATARACT EXTRACTION PHACO AND INTRAOCULAR LENS PLACEMENT LEFT EYE;  Surgeon: Tonny Branch, MD;  Location: [facility];  Service: Ophthalmology;  Laterality: Left;  CDE: 19.23  . COLONOSCOPY    . COLONOSCOPY N/A 01/15/2014   Procedure: COLONOSCOPY;  Surgeon: Daneil Dolin, MD;  Location: [facility] ENDO SUITE;  Service: Endoscopy;  Laterality: N/A;  9:00 AM  . DOPPLER ECHOCARDIOGRAPHY    . JOINT REPLACEMENT  07/01/2010   left hip  . KNEE SURGERY Right    arthroscopy  . left hip replaced  07/01/2010  . SPINE SURGERY  2006   cervical  . stress dipyridamole myocardial perfusion    . THYROIDECTOMY    . TONSILLECTOMY    . VESICOVAGINAL FISTULA CLOSURE W/ TAH     Social History   Socioeconomic History  . Marital status: Married     Spouse name: Not on file  . Number of children: 2  . Years of education: 81  . Highest education level: Not on file  Occupational History  . Occupation: retired     Comment: Company secretary  . Financial resource strain: Not on file  . Food insecurity:    Worry: Not on file    Inability: Not on file  . Transportation needs:    Medical: Not on file    Non-medical: Not on file  Tobacco Use  . Smoking status: Passive Smoke Exposure - Never Smoker  . Smokeless tobacco: Never Used  . Tobacco comment: Husband smokes in the home  Substance and Sexual Activity  . Alcohol use: No  . Drug use: No  . Sexual activity: Never  Lifestyle  . Physical activity:  Days per week: Not on file    Minutes per session: Not on file  . Stress: Not on file  Relationships  . Social connections:    Talks on phone: Not on file    Gets together: Not on file    Attends religious service: Not on file    Active member of club or organization: Not on file    Attends meetings of clubs or organizations: Not on file    Relationship status: Not on file  . Intimate partner violence:    Fear of current or ex partner: Not on file    Emotionally abused: Not on file    Physically abused: Not on file    Forced sexual activity: Not on file  Other Topics Concern  . Not on file  Social History Narrative   Lives with husband, at home   No caffeine   Current Outpatient Medications  Medication Sig Dispense Refill  . aspirin EC 81 MG tablet Take 1 tablet (81 mg total) by mouth daily.    . ferrous sulfate 325 (65 FE) MG tablet Take 325 mg by mouth daily with breakfast.    . fluticasone (FLONASE) 50 MCG/ACT nasal spray Place 2 sprays into both nostrils daily. 16 g 6  . gabapentin (NEURONTIN) 100 MG capsule TAKE 1 CAPSULE(100 MG) BY MOUTH THREE TIMES DAILY (Patient taking differently: Take 100 mg by mouth at bedtime. ) 270 capsule 1  . glucose blood (ACCU-CHEK AVIVA PLUS) test strip Use as instructed four  times daily dx E11.65 150 each 5  . imipramine (TOFRANIL) 25 MG tablet TAKE 4 TABLETS BY MOUTH EVERY NIGHT AT BEDTIME 120 tablet 0  . Insulin Glargine (TOUJEO SOLOSTAR [place]) Inject 30-50 Units into the skin 2 (two) times daily.    . isosorbide mononitrate (IMDUR) 30 MG 24 hr tablet TAKE 1/2 TABLET(15 MG) BY MOUTH DAILY 45 tablet 2  . Lancets (ACCU-CHEK MULTICLIX) lancets Use as instructed three times daily dx 250.01 100 each 5  . levothyroxine (SYNTHROID) 200 MCG tablet Take 1 tablet (200 mcg total) by mouth daily before breakfast. 30 tablet 5  . loratadine (CLARITIN) 10 MG tablet Take 10 mg by mouth daily as needed for allergies.     . Menthol, Topical Analgesic, (ICY HOT EX) Apply 1 application topically 3 (three) times daily as needed (for back pain.).    Marland Kitchen metFORMIN (GLUCOPHAGE) 1000 MG tablet TAKE 1 TABLET TWICE A DAY 180 tablet 1  . metoprolol tartrate (LOPRESSOR) 50 MG tablet Take 1 tablet (50 mg total) by mouth 2 (two) times daily. 180 tablet 3  . Multiple Vitamin (MULTIVITAMIN) tablet Take 1 tablet by mouth daily.    Marland Kitchen PARoxetine (PAXIL) 20 MG tablet Take 1 tablet (20 mg total) by mouth daily. Patient reports that she takes it 1-2 times week. 90 tablet 3  . pravastatin (PRAVACHOL) 80 MG tablet TAKE 1 TABLET DAILY 90 tablet 1  . quinapril (ACCUPRIL) 10 MG tablet Take 1 tablet (10 mg total) by mouth daily. 90 tablet 1  . Semaglutide,0.25 or 0.5MG /DOS, (OZEMPIC, 0.25 OR 0.5 MG/DOSE,) 2 MG/1.5ML SOPN Inject 0.5 mg into the skin once a week. 2 pen 5  . verapamil (CALAN-SR) 180 MG CR tablet TAKE 1 TABLET(180 MG) BY MOUTH AT BEDTIME 30 tablet 11  . vitamin B-12 (CYANOCOBALAMIN) 1000 MCG tablet Take 1 tablet (1,000 mcg total) by mouth daily. 30 tablet 5  . VITAMIN D, CHOLECALCIFEROL, PO Take 1 capsule by mouth daily.    Marland Kitchen  TOUJEO SOLOSTAR 300 UNIT/ML SOPN INJECT 55 UNITS INTO THE SKIN BID     No current facility-administered medications for this visit.      Allergies  Allergen Reactions  .  Daypro [Oxaprozin] Hives  . Sulfonamide Derivatives Hives   Family History  Problem Relation Age of Onset  . Heart attack Father   . Heart failure Mother   . Asthma Daughter   . Sleep apnea Son        CPAP  . Colon cancer Neg Hx     PE: BP 126/70   Pulse 68   Ht 5\' 5"  (1.651 m)   Wt 190 lb (86.2 kg)   LMP 11/11/2016   SpO2 98%   BMI 31.62 kg/m  Wt Readings from Last 3 Encounters:  05/26/18 190 lb (86.2 kg)  04/11/18 193 lb 9.6 oz (87.8 kg)  02/07/18 188 lb (85.3 kg)   Constitutional: overweight, in NAD Eyes: PERRLA, EOMI, no exophthalmos ENT: moist mucous membranes, no neck masses palpated, no cervical lymphadenopathy Cardiovascular: RRR, No MRG Respiratory: CTA B Gastrointestinal: abdomen soft, NT, ND, BS+ Musculoskeletal: no deformities, strength intact in all 4 Skin: moist, warm, no rashes Neurological: no tremor with outstretched hands, DTR normal in all 4  ASSESSMENT: 1. DM2, insulin-dependent, uncontrolled, with long-term complications - CAD - DR  2.  Papillary thyroid cancer  3.  Postsurgical hypothyroidism  PLAN:  1. Patient with longstanding, uncontrolled, type 2 diabetes, on oral antidiabetic regimen with metformin, long-acting insulin, and now Ozempic added at last visit.  He did not feel that Ozempic made a large difference in her CBGs, but she does not have nausea or vomiting with the medication, and we discussed that I would advise her to continue with this especially for the cardiovascular benefits.  She does have some constipation and we discussed about increasing dietary fiber and water. -At this visit, sugars are high especially after dinner and we discussed about increasing Ozempic further, to 1 mg weekly. -We will continue the same dose of Toujeo for now with I am hoping that we can start decreasing it further at next visit - I suggested to:  Patient Instructions  Please continue: - Metformin 1000 mg 2x a day with meals. - Toujeo 30 units in  a.m. and 50 units at bedtime  Increase: - Ozempic to 1 mg weekly in a.m.  Please continue levothyroxine 200 mcg daily.  Take the thyroid hormone every day, with water, at least 30 minutes before breakfast, separated by at least 4 hours from: - acid reflux medications - calcium - iron - multivitamins  Please stop at the lab.  Please return in 3 months with your sugar log.   - today, HbA1c is 8.4% (higher) - continue checking sugars at different times of the day - check 2x a day, rotating checks - advised for yearly eye exams >> she is UTD - Return to clinic in 3 mo with sugar log    2.  Papillary thyroid cancer -Reviewed previous thyroid cancer history per records from Dr. Ronnald Collum -This appeared to have been metastatic, as patient is status post for RAI treatments.  She also had increased signal on a PET scan from 2009, however, in 2010, another PET scan showed possible inflammatory lymph nodes in the same area. -At this visit, we will check a thyroglobulin and ATA antibodies -After the results are back, we discussed about getting a new thyroid ultrasound  3.  Postsurgical hypothyroidism - she continues on LT4 200 mcg  daily - pt feels good on this dose. - we discussed about taking the thyroid hormone every day, with water, >30 minutes before breakfast, separated by >4 hours from acid reflux medications, calcium, iron, multivitamins. Pt. is taking it correctly. - will check thyroid tests today: TSH and fT4 - If labs are abnormal, she will need to return for repeat TFTs in 1.5 months  Component     Latest Ref Rng & Units 05/26/2018  Thyroglobulin     ng/mL 0.7 (L)  Comment        Hemoglobin A1C     4.0 - 5.6 % 8.4 (A)  TSH     0.35 - 4.50 uIU/mL 0.61  T4,Free(Direct)     0.60 - 1.60 ng/dL 1.09  Thyroglobulin Ab     < or = 1 IU/mL <1   Detectable thyroglobulin.  We will check another thyroid ultrasound.  She may need a whole-body scan. TFTs are normal.  Status:  Final  result Visible to patient:  No (Not Released) Dx:  Thyroid cancer [facility])  Details   Reading Physician Reading Date Result Priority  Marybelle Killings, MD 06/13/2018     Narrative & Impression    CLINICAL DATA:  Other.  History of thyroid carcinoma.  COMPARISON:  None.  FINDINGS: There is no residual tissue in the right or left thyroid beds.  Subcentimeter short axis diameter lymph nodes are identified with fatty hila  There is a densely calcified mass in the superior right neck soft tissues measuring up to 1.1 cm in size.  IMPRESSION: There is no residual or recurrent tissue in the right or left thyroid beds.  There is no evidence of abnormal adenopathy by short axis diameter measurement criteria  There is a nonspecific calcified soft tissue mass measuring 1.1 cm in the right superior neck. This is of unknown significance. CT neck can be performed to further characterize.  The above is in keeping with the ACR TI-RADS recommendations - J Am Coll Radiol 2017;14:587-595.  Electronically Signed   By: Marybelle Killings M.D.   On: 06/13/2018 12:39       Will discuss with patient to check a CT of the neck to further characterize the calcified soft tissue mass from the right superior neck.  However, I would first suggest to perform a Thyrogen-WBS to see if we can find the reason for the increase in thyroglobulin.  Philemon Kingdom, MD PhD [facility] Endocrinology

## 2018-05-29 LAB — THYROGLOBULIN ANTIBODY: Thyroglobulin Ab: <1 IU/mL (ref <=1)

## 2018-05-29 LAB — THYROGLOBULIN LEVEL: Thyroglobulin: 0.7 ng/mL — ABNORMAL LOW

## 2018-05-30 ENCOUNTER — Encounter: Payer: Self-pay | Admitting: Internal Medicine

## 2018-05-30 ENCOUNTER — Telehealth: Payer: Self-pay

## 2018-05-30 NOTE — Telephone Encounter (Signed)
-----   Message from Philemon Kingdom, MD sent at 05/30/2018 11:48 AM EDT ----- Lenna Sciara, can you please call pt: Her thyroid test are normal, therefore, please continue the current dose of levothyroxine.  Her thyroglobulin is higher, at 0.7 (previously 0.3).  At this point, I ordered a neck ultrasound to be done downstairs, and [place] [facility]  .

## 2018-05-31 NOTE — Telephone Encounter (Signed)
I spoke to patient and let her know of results and that she will be contacted to schedule.  While on phone patient again mentioned the constipation from [place], she states over last weekend it did get worse and she is worried about increasing the dose and making it worse. Patient can only have a BM if using a laxative.  Please advise.

## 2018-05-31 NOTE — Telephone Encounter (Signed)
OK, let's not increase the dose then (if she did not pick it up yet) and continue 0.5 mg weekly. However, I would suggest to start 8-10 units Novolog or Humalog 15 min before dinner, whichever covered. Can you please send 1 box of 5 pens?

## 2018-06-02 NOTE — Telephone Encounter (Signed)
Left message for patient to return our call at [phone].  

## 2018-06-05 ENCOUNTER — Encounter: Payer: Self-pay | Admitting: Internal Medicine

## 2018-06-05 ENCOUNTER — Other Ambulatory Visit: Payer: Self-pay

## 2018-06-05 ENCOUNTER — Telehealth: Payer: Self-pay | Admitting: Family Medicine

## 2018-06-05 DIAGNOSIS — M549 Dorsalgia, unspecified: Secondary | ICD-10-CM

## 2018-06-05 MED ORDER — GABAPENTIN 100 MG PO CAPS: ORAL_CAPSULE | ORAL | 1 refills | Status: DC

## 2018-06-05 MED ORDER — SEMAGLUTIDE(0.25 OR 0.5MG/DOS) 2 MG/1.5ML ~~LOC~~ SOPN: 0.5000 mg | PEN_INJECTOR | SUBCUTANEOUS | 2 refills | Status: DC

## 2018-06-05 NOTE — Telephone Encounter (Signed)
Pt is out of her gabapentin, and would like it called in locally

## 2018-06-05 NOTE — Telephone Encounter (Signed)
Patient states she is returning call from our office. Patient has still not heard from [place]. Please call patient at [phone].

## 2018-06-05 NOTE — Telephone Encounter (Signed)
Refill sent in

## 2018-06-05 NOTE — Telephone Encounter (Signed)
Notified patient, she does not want to start insulin at this time.  RX for the 0.5 ozempic sent.  Gave patient phone number to call for [place].

## 2018-06-05 NOTE — Telephone Encounter (Signed)
Copied and pasted this message into the already open phone encounter regarding this.

## 2018-06-05 NOTE — Telephone Encounter (Signed)
Jennifer Blackburn 1 hour ago (12:03 PM)      Patient states she is returning call from our office. Patient has still not heard from [place]. Please call patient at [phone].

## 2018-06-12 ENCOUNTER — Ambulatory Visit (INDEPENDENT_AMBULATORY_CARE_PROVIDER_SITE_OTHER): Payer: Medicare Other | Admitting: Family Medicine

## 2018-06-12 DIAGNOSIS — I10 Essential (primary) hypertension: Secondary | ICD-10-CM | POA: Diagnosis not present

## 2018-06-12 DIAGNOSIS — E782 Mixed hyperlipidemia: Secondary | ICD-10-CM

## 2018-06-12 DIAGNOSIS — E669 Obesity, unspecified: Secondary | ICD-10-CM | POA: Diagnosis not present

## 2018-06-12 DIAGNOSIS — E1159 Type 2 diabetes mellitus with other circulatory complications: Secondary | ICD-10-CM

## 2018-06-12 NOTE — Progress Notes (Signed)
Virtual Visit via Telephone Note  I connected with Jennifer Blackburn on 06/12/18 at  1:00 PM EDT by telephone and verified that I am speaking with the correct person using two identifiers.1:05pm   I discussed the limitations, risks, security and privacy concerns of performing an evaluation and management service by telephone and the availability of in person appointments. I also discussed with the patient that there may be a patient responsible charge related to this service. The patient expressed understanding and agreed to proceed.   History of Present Illness: F/U chronic conditions and review of recent Consultation  With Endocrinology,   States calf pain that had been present has resolved  Has  Had appointments with Endo, has thyroid scan upcoming and has started new additional medication for her diabetes Denies polyuria, polydipsia, blurred vision , or hypoglycemic episodes. Is working on regular exercise and watching food choice   Observations/Objective: Denies recent fever or chills. Denies sinus pressure, nasal congestion, ear pain or sore throat. Denies chest congestion, productive cough or wheezing. Denies chest pains, palpitations and leg swelling Denies abdominal pain, nausea, vomiting,diarrhea or constipation.   Denies dysuria, frequency, hesitancy or incontinence. Denies uncontrolled joint pain, swelling and limitation in mobility. Denies headaches, seizures, numbness, or tingling. Denies depression, anxiety or insomnia. Denies skin break down or rash.        Assessment and Plan: Hypertension DASH diet and commitment to daily physical activity for a minimum of 30 minutes discussed and encouraged, as a part of hypertension management. The importance of attaining a healthy weight is also discussed. Generally well controlled will assess at next visit  BP/Weight 05/26/2018 04/11/2018 02/07/2018 02/06/2018 01/10/2018 10/25/2017 2/63/7858  Systolic BP 850 277 412 - 878 -  676  Diastolic BP 70 74 60 - 60 - 70  Wt. (Lbs) 190 193.6 188 189 191 195 192.8  BMI 31.62 32.22 31.28 31.45 31.78 32.45 32.08       Hyperlipidemia Hyperlipidemia:Low fat diet discussed and encouraged.   Lipid Panel  Lab Results  Component Value Date   CHOL 167 01/18/2018   HDL 45 (L) 01/18/2018   LDLCALC 100 (H) 01/18/2018   TRIG 127 01/18/2018   CHOLHDL 3.7 01/18/2018   Needs to lower fat intake, not at goal Updated lab needed at/ before next visit.     Obesity with body mass index greater than 30   Patient re-educated about  the importance of commitment to a  minimum of 150 minutes of exercise per week as able.  The importance of healthy food choices with portion control discussed, as well as eating regularly and within a 12 hour window most days. The need to choose "clean , green" food 50 to 75% of the time is discussed, as well as to make water the primary drink and set a goal of 64 ounces water daily.  Encouraged to start a food diary,  and to consider  joining a support group. Sample diet sheets offered. Goals set by the patient for the next several months.   Weight /BMI 05/26/2018 04/11/2018 02/07/2018  WEIGHT 190 lb 193 lb 9.6 oz 188 lb  HEIGHT 5\' 5"  5\' 5"  5\' 5"   BMI 31.62 kg/m2 32.22 kg/m2 31.28 kg/m2      Type 2 diabetes mellitus with vascular disease (HCC) Uncontrolled, managed by endo, anticipate improvement with next lab draw Jennifer Blackburn is reminded of the importance of commitment to daily physical activity for 30 minutes or more, as able and the need to limit carbohydrate  intake to 30 to 60 grams per meal to help with blood sugar control.   The need to take medication as prescribed, test blood sugar as directed, and to call between visits if there is a concern that blood sugar is uncontrolled is also discussed.   Jennifer Blackburn is reminded of the importance of daily foot exam, annual eye examination, and good blood sugar, blood pressure and cholesterol  control.  Diabetic Labs Latest Ref Rng & Units 05/26/2018 02/07/2018 01/18/2018 08/06/2017 06/14/2017  HbA1c 4.0 - 5.6 % 8.4(A) 8.0(A) - 8.9 -  Microalbumin Not Estab. ug/mL - - - - 19.5(H)  Micro/Creat Ratio 0.0 - 30.0 mg/g creat - - - - 11.5  Chol <200 mg/dL - - 167 - -  HDL >50 mg/dL - - 45(L) - -  Calc LDL mg/dL (calc) - - 100(H) - -  Triglycerides <150 mg/dL - - 127 - -  Creatinine 0.60 - 0.93 mg/dL - - 0.79 - -   BP/Weight 05/26/2018 04/11/2018 02/07/2018 02/06/2018 01/10/2018 10/25/2017 05/20/6008  Systolic BP 932 355 732 - 202 - 542  Diastolic BP 70 74 60 - 60 - 70  Wt. (Lbs) 190 193.6 188 189 191 195 192.8  BMI 31.62 32.22 31.28 31.45 31.78 32.45 32.08   Foot/eye exam completion dates Latest Ref Rng & Units 05/24/2018 01/10/2018  Eye Exam No Retinopathy No Retinopathy -  Foot exam Order - - -  Foot Form Completion - - Done          Follow Up Instructions:    I discussed the assessment and treatment plan with the patient. The patient was provided an opportunity to ask questions and all were answered. The patient agreed with the plan and demonstrated an understanding of the instructions.   The patient was advised to call back or seek an in-person evaluation if the symptoms worsen or if the condition fails to improve as anticipated.  I provided 22  minutes of non-face-to-face time during this encounter.   Tula Nakayama, MD

## 2018-06-13 ENCOUNTER — Other Ambulatory Visit: Payer: Self-pay

## 2018-06-13 ENCOUNTER — Telehealth: Payer: Self-pay

## 2018-06-13 ENCOUNTER — Ambulatory Visit
Admission: RE | Admit: 2018-06-13 | Discharge: 2018-06-13 | Disposition: A | Discharge status: Home / Self Care | Payer: Medicare Other | Source: Ambulatory Visit | Attending: Internal Medicine | Admitting: Internal Medicine

## 2018-06-13 DIAGNOSIS — C73 Malignant neoplasm of thyroid gland: Secondary | ICD-10-CM | POA: Diagnosis not present

## 2018-06-13 NOTE — Telephone Encounter (Signed)
-----   Message from Philemon Kingdom, MD sent at 06/13/2018  2:09 PM EDT ----- Lenna Sciara, can you please call pt: There are no suspicious masses after her thyroid surgery, however, there is a calcified small mass in the upper part of her right neck (the soft tissue) and we may need to check a CT scan for this.  However, as her thyroglobulin was a little higher at last check, I would first suggest a whole-body scan to see if we can find the reason for the increase in thyroglobulin.  This is done usually at [facility].  Please let me know if she agrees to have this done.  It may need to wait 1 or 2 months due to the corona pandemic, though.

## 2018-06-14 ENCOUNTER — Encounter: Payer: Self-pay | Admitting: Family Medicine

## 2018-06-14 ENCOUNTER — Telehealth: Payer: Self-pay

## 2018-06-14 DIAGNOSIS — I10 Essential (primary) hypertension: Secondary | ICD-10-CM

## 2018-06-14 DIAGNOSIS — E785 Hyperlipidemia, unspecified: Secondary | ICD-10-CM

## 2018-06-14 NOTE — Telephone Encounter (Signed)
Labs ordered per AVS

## 2018-06-14 NOTE — Assessment & Plan Note (Signed)
Uncontrolled, managed by endo, anticipate improvement with next lab draw Jennifer Blackburn is reminded of the importance of commitment to daily physical activity for 30 minutes or more, as able and the need to limit carbohydrate intake to 30 to 60 grams per meal to help with blood sugar control.   The need to take medication as prescribed, test blood sugar as directed, and to call between visits if there is a concern that blood sugar is uncontrolled is also discussed.   Jennifer Blackburn is reminded of the importance of daily foot exam, annual eye examination, and good blood sugar, blood pressure and cholesterol control.  Diabetic Labs Latest Ref Rng & Units 05/26/2018 02/07/2018 01/18/2018 08/06/2017 06/14/2017  HbA1c 4.0 - 5.6 % 8.4(A) 8.0(A) - 8.9 -  Microalbumin Not Estab. ug/mL - - - - 19.5(H)  Micro/Creat Ratio 0.0 - 30.0 mg/g creat - - - - 11.5  Chol <200 mg/dL - - 167 - -  HDL >50 mg/dL - - 45(L) - -  Calc LDL mg/dL (calc) - - 100(H) - -  Triglycerides <150 mg/dL - - 127 - -  Creatinine 0.60 - 0.93 mg/dL - - 0.79 - -   BP/Weight 05/26/2018 04/11/2018 02/07/2018 02/06/2018 01/10/2018 10/25/2017 3/76/2831  Systolic BP 517 616 073 - 710 - 626  Diastolic BP 70 74 60 - 60 - 70  Wt. (Lbs) 190 193.6 188 189 191 195 192.8  BMI 31.62 32.22 31.28 31.45 31.78 32.45 32.08   Foot/eye exam completion dates Latest Ref Rng & Units 05/24/2018 01/10/2018  Eye Exam No Retinopathy No Retinopathy -  Foot exam Order - - -  Foot Form Completion - - Done

## 2018-06-14 NOTE — Telephone Encounter (Signed)
Left message for patient to return our call at [phone].  

## 2018-06-14 NOTE — Assessment & Plan Note (Signed)
   Patient re-educated about  the importance of commitment to a  minimum of 150 minutes of exercise per week as able.  The importance of healthy food choices with portion control discussed, as well as eating regularly and within a 12 hour window most days. The need to choose "clean , green" food 50 to 75% of the time is discussed, as well as to make water the primary drink and set a goal of 64 ounces water daily.  Encouraged to start a food diary,  and to consider  joining a support group. Sample diet sheets offered. Goals set by the patient for the next several months.   Weight /BMI 05/26/2018 04/11/2018 02/07/2018  WEIGHT 190 lb 193 lb 9.6 oz 188 lb  HEIGHT 5\' 5"  5\' 5"  5\' 5"   BMI 31.62 kg/m2 32.22 kg/m2 31.28 kg/m2

## 2018-06-14 NOTE — Telephone Encounter (Signed)
Notified patient of message from Dr. Cruzita Lederer, patient expressed understanding and agreement. No further questions.  Patient agrees to the scan.

## 2018-06-14 NOTE — Patient Instructions (Signed)
F/U in 6 months, call if you need me sooner  Please get fasting lipid , cmp and EGFr the 2nd week in September  Thankful that you are very happy with your new Endocrinologist, as you continue to work with her closely, your blood sugar wil become controlled and your health will improve  Social distancing. Frequent hand washing with soap and water Keeping your hands off of your face. These 3 practices will help to keep both you and your community healthy during this time. Please practice them faithfully!  It is important that you exercise regularly at least 30 minutes 5 times a week. If you develop chest pain, have severe difficulty breathing, or feel very tired, stop exercising immediately and seek medical attention  Think about what you will eat, plan ahead. Choose " clean, green, fresh or frozen" over canned, processed or packaged foods which are more sugary, salty and fatty. 70 to 75% of food eaten should be vegetables and fruit. Three meals at set times with snacks allowed between meals, but they must be fruit or vegetables. Aim to eat over a 12 hour period , example 7 am to 7 pm, and STOP after  your last meal of the day. Drink water,generally about 64 ounces per day, no other drink is as healthy. Fruit juice is best enjoyed in a healthy way, by EATING the fruit.   Thank you  for choosing [facility]. We consider it a privelige to serve you.  Delivering excellent health care in a caring and  compassionate way is our goal.  Partnering with you,  so that together we can achieve this goal is our strategy.

## 2018-06-14 NOTE — Assessment & Plan Note (Signed)
Hyperlipidemia:Low fat diet discussed and encouraged.   Lipid Panel  Lab Results  Component Value Date   CHOL 167 01/18/2018   HDL 45 (L) 01/18/2018   LDLCALC 100 (H) 01/18/2018   TRIG 127 01/18/2018   CHOLHDL 3.7 01/18/2018   Needs to lower fat intake, not at goal Updated lab needed at/ before next visit.

## 2018-06-14 NOTE — Assessment & Plan Note (Signed)
DASH diet and commitment to daily physical activity for a minimum of 30 minutes discussed and encouraged, as a part of hypertension management. The importance of attaining a healthy weight is also discussed. Generally well controlled will assess at next visit  BP/Weight 05/26/2018 04/11/2018 02/07/2018 02/06/2018 01/10/2018 10/25/2017 5/63/8937  Systolic BP 342 876 811 - 572 - 620  Diastolic BP 70 74 60 - 60 - 70  Wt. (Lbs) 190 193.6 188 189 191 195 192.8  BMI 31.62 32.22 31.28 31.45 31.78 32.45 32.08

## 2018-06-15 ENCOUNTER — Other Ambulatory Visit: Payer: Self-pay | Admitting: Internal Medicine

## 2018-06-15 DIAGNOSIS — C73 Malignant neoplasm of thyroid gland: Secondary | ICD-10-CM

## 2018-06-15 NOTE — Telephone Encounter (Signed)
Ordered the WBS.

## 2018-06-16 ENCOUNTER — Other Ambulatory Visit: Payer: Self-pay | Admitting: Cardiovascular Disease

## 2018-06-19 ENCOUNTER — Ambulatory Visit: Payer: Medicare Other | Admitting: Nutrition

## 2018-06-19 NOTE — Telephone Encounter (Signed)
RX refill

## 2018-06-23 ENCOUNTER — Other Ambulatory Visit: Payer: Self-pay | Admitting: Family Medicine

## 2018-06-24 ENCOUNTER — Other Ambulatory Visit: Payer: Self-pay | Admitting: Family Medicine

## 2018-07-04 ENCOUNTER — Ambulatory Visit: Payer: Medicare Other | Admitting: Nutrition

## 2018-07-10 ENCOUNTER — Other Ambulatory Visit: Payer: Self-pay

## 2018-07-10 ENCOUNTER — Ambulatory Visit (INDEPENDENT_AMBULATORY_CARE_PROVIDER_SITE_OTHER): Payer: Medicare Other | Admitting: Family Medicine

## 2018-07-10 VITALS — BP 120/70 | Ht 65.0 in | Wt 180.0 lb

## 2018-07-10 DIAGNOSIS — E1159 Type 2 diabetes mellitus with other circulatory complications: Secondary | ICD-10-CM | POA: Diagnosis not present

## 2018-07-10 DIAGNOSIS — A084 Viral intestinal infection, unspecified: Secondary | ICD-10-CM

## 2018-07-10 DIAGNOSIS — I10 Essential (primary) hypertension: Secondary | ICD-10-CM

## 2018-07-10 DIAGNOSIS — K529 Noninfective gastroenteritis and colitis, unspecified: Secondary | ICD-10-CM | POA: Insufficient documentation

## 2018-07-10 NOTE — Assessment & Plan Note (Signed)
Acute onset

## 2018-07-10 NOTE — Patient Instructions (Addendum)
F/u as before , call if you need me before  Please get fasting labs 1 week before your September visit ( you already have the order sheet) Symptoms are consistent with acute viral gastroenteritis, and I am thankful this has  cleared entirely  It is important that you exercise regularly at least 30 minutes 5 times a week. If you develop chest pain, have severe difficulty breathing, or feel very tired, stop exercising immediately and seek medical attention    Think about what you will eat, plan ahead. Choose " clean, green, fresh or frozen" over canned, processed or packaged foods which are more sugary, salty and fatty. 70 to 75% of food eaten should be vegetables and fruit. Three meals at set times with snacks allowed between meals, but they must be fruit or vegetables. Aim to eat over a 12 hour period , example 7 am to 7 pm, and STOP after  your last meal of the day. Drink water,generally about 64 ounces per day, no other drink is as healthy. Fruit juice is best enjoyed in a healthy way, by EATING the fruit.   Call Endo re blood sugar if it remains a concern  Social distancing. Frequent hand washing with soap and water Keeping your hands off of your face.Wear a face mask whenever you leave home These 3 practices will help to keep both you and your community healthy during this time. Please practice them faithfully!    Viral Gastroenteritis, Adult  Viral gastroenteritis is also known as the stomach flu. This condition is caused by certain germs (viruses). These germs can be passed from person to person very easily (are very contagious). This condition can cause sudden watery poop (diarrhea), fever, and throwing up (vomiting). Having watery poop and throwing up can make you feel weak and cause you to get dehydrated. Dehydration can make you tired and thirsty, make you have a dry mouth, and make it so you pee (urinate) less often. Older adults and people with other diseases or a weak  defense system (immune system) are at higher risk for dehydration. It is important to replace the fluids that you lose from having watery poop and throwing up. Follow these instructions at home: Follow instructions from your doctor about how to care for yourself at home. Eating and drinking Follow these instructions as told by your doctor:  Take an oral rehydration solution (ORS). This is a drink that is sold at pharmacies and stores.  Drink clear fluids in small amounts as you are able, such as: ? Water. ? Ice chips. ? Diluted fruit juice. ? Low-calorie sports drinks.  Eat bland, easy-to-digest foods in small amounts as you are able, such as: ? Bananas. ? Applesauce. ? Rice. ? Low-fat (lean) meats. ? Toast. ? Crackers.  Avoid fluids that have a lot of sugar or caffeine in them.  Avoid alcohol.  Avoid spicy or fatty foods. General instructions   Drink enough fluid to keep your pee (urine) clear or pale yellow.  Wash your hands often. If you cannot use soap and water, use hand sanitizer.  Make sure that all people in your home wash their hands well and often.  Rest at home while you get better.  Take over-the-counter and prescription medicines only as told by your doctor.  Watch your condition for any changes.  Take a warm bath to help with any burning or pain from having watery poop.  Keep all follow-up visits as told by your doctor. This is important. Contact  a doctor if:  You cannot keep fluids down.  Your symptoms get worse.  You have new symptoms.  You feel light-headed or dizzy.  You have muscle cramps. Get help right away if:  You have chest pain.  You feel very weak or you pass out (faint).  You see blood in your throw-up.  Your throw-up looks like coffee grounds.  You have bloody or black poop (stools) or poop that look like tar.  You have a very bad headache, a stiff neck, or both.  You have a rash.  You have very bad pain, cramping, or  bloating in your belly (abdomen).  You have trouble breathing.  You are breathing very quickly.  Your heart is beating very quickly.  Your skin feels cold and clammy.  You feel confused.  You have pain when you pee.  You have signs of dehydration, such as: ? Dark pee, hardly any pee, or no pee. ? Cracked lips. ? Dry mouth. ? Sunken eyes. ? Sleepiness. ? Weakness. This information is not intended to replace advice given to you by your health care provider. Make sure you discuss any questions you have with your health care provider. Document Released: 08/25/2007 Document Revised: 11/30/2017 Document Reviewed: 11/12/2014 Elsevier Interactive Patient Education  2019 [employer].

## 2018-07-10 NOTE — Progress Notes (Signed)
Virtual Visit via Telephone Note  I connected with Antoine Primas on 07/10/18 at  3:00 PM EDT by telephone and verified that I am speaking with the correct person using two identifiers.   I discussed the limitations, risks, security and privacy concerns of performing an evaluation and management service by telephone and the availability of in person appointments. I also discussed with the patient that there may be a patient responsible charge related to this service. The patient expressed understanding and agreed to proceed. Pt is at her home and I am in the office, webex attempted , but not successful  History of Present Illness: 4/14 woke up with abdominal pain and loose stool and nausea , for the entire last week. No other family member affected A lot of loose stool , felt light headed No BM since x 3 days, eating and drinking is back to normal States since starting ozempic she is having GI disturbance  As she was advised may happen, will contact Endo about this  Denies polyuria, polydipsia, blurred vision , or hypoglycemic episodes. Testing regularly , still not where she needs to be with blood sugar control, but is very hopeful about this Review of Systems  Constitutional: Negative for chills and fever.  HENT: Negative for congestion and sinus pain.   Respiratory: Negative for cough and sputum production.   Cardiovascular: Negative for chest pain.  Gastrointestinal: Positive for diarrhea. Negative for blood in stool, heartburn and nausea.  Psychiatric/Behavioral: Negative for depression. The patient is not nervous/anxious.      Observations/Objective: BP 120/70   Ht 5\' 5"  (1.651 m)   Wt 180 lb (81.6 kg)   LMP 11/11/2016   BMI 29.95 kg/m    Assessment and Plan: Gastroenteritis Acute onset   Viral gastroenteritis Acute episode of loose stool which cleared within 72 hrs,m which is reassuring , asymptomatic x 3 days. General education provided re gastroenteritis and the   Need to practice good hygiene and ensure adequate fluid intake    Hypertension Controlled, no change in medication DASH diet and commitment to daily physical activity for a minimum of 30 minutes discussed and encouraged, as a part of hypertension management. The importance of attaining a healthy weight is also discussed.  BP/Weight 07/10/2018 05/26/2018 04/11/2018 02/07/2018 02/06/2018 16/12/9602 07/24/979  Systolic BP 191 478 295 621 - 308 -  Diastolic BP 70 70 74 60 - 60 -  Wt. (Lbs) 180 190 193.6 188 189 191 195  BMI 29.95 31.62 32.22 31.28 31.45 31.78 32.45       Type 2 diabetes mellitus with vascular disease ([place]) Ms. Mcphee is reminded of the importance of commitment to daily physical activity for 30 minutes or more, as able and the need to limit carbohydrate intake to 30 to 60 grams per meal to help with blood sugar control.   The need to take medication as prescribed, test blood sugar as directed, and to call between visits if there is a concern that blood sugar is uncontrolled is also discussed.   Ms. Braun is reminded of the importance of daily foot exam, annual eye examination, and good blood sugar, blood pressure and cholesterol control. Uncontrolled , managed by Endo, pot to contact endo re  elevated blood sugar  Diabetic Labs Latest Ref Rng & Units 05/26/2018 02/07/2018 01/18/2018 08/06/2017 06/14/2017  HbA1c 4.0 - 5.6 % 8.4(A) 8.0(A) - 8.9 -  Microalbumin Not Estab. ug/mL - - - - 19.5(H)  Micro/Creat Ratio 0.0 - 30.0 mg/g creat - - - -  11.5  Chol <200 mg/dL - - 167 - -  HDL >50 mg/dL - - 45(L) - -  Calc LDL mg/dL (calc) - - 100(H) - -  Triglycerides <150 mg/dL - - 127 - -  Creatinine 0.60 - 0.93 mg/dL - - 0.79 - -   BP/Weight 07/10/2018 05/26/2018 04/11/2018 02/07/2018 02/06/2018 81/38/8719 07/28/7469  Systolic BP 855 015 868 257 - 493 -  Diastolic BP 70 70 74 60 - 60 -  Wt. (Lbs) 180 190 193.6 188 189 191 195  BMI 29.95 31.62 32.22 31.28 31.45 31.78 32.45   Foot/eye exam  completion dates Latest Ref Rng & Units 05/24/2018 01/10/2018  Eye Exam No Retinopathy No Retinopathy -  Foot exam Order - - -  Foot Form Completion - - Done           Follow Up Instructions:    I discussed the assessment and treatment plan with the patient. The patient was provided an opportunity to ask questions and all were answered. The patient agreed with the plan and demonstrated an understanding of the instructions.   The patient was advised to call back or seek an in-person evaluation if the symptoms worsen or if the condition fails to improve as anticipated.  I provided 18 minutes of non-face-to-face time during this encounter.   Tula Nakayama, MD

## 2018-07-16 ENCOUNTER — Encounter: Payer: Self-pay | Admitting: Family Medicine

## 2018-07-16 DIAGNOSIS — A084 Viral intestinal infection, unspecified: Secondary | ICD-10-CM | POA: Insufficient documentation

## 2018-07-16 NOTE — Assessment & Plan Note (Signed)
Controlled, no change in medication DASH diet and commitment to daily physical activity for a minimum of 30 minutes discussed and encouraged, as a part of hypertension management. The importance of attaining a healthy weight is also discussed.  BP/Weight 07/10/2018 05/26/2018 04/11/2018 02/07/2018 02/06/2018 83/50/7573 04/23/5670  Systolic BP 091 980 221 798 - 102 -  Diastolic BP 70 70 74 60 - 60 -  Wt. (Lbs) 180 190 193.6 188 189 191 195  BMI 29.95 31.62 32.22 31.28 31.45 31.78 32.45

## 2018-07-16 NOTE — Assessment & Plan Note (Signed)
Jennifer Blackburn is reminded of the importance of commitment to daily physical activity for 30 minutes or more, as able and the need to limit carbohydrate intake to 30 to 60 grams per meal to help with blood sugar control.   The need to take medication as prescribed, test blood sugar as directed, and to call between visits if there is a concern that blood sugar is uncontrolled is also discussed.   Jennifer Blackburn is reminded of the importance of daily foot exam, annual eye examination, and good blood sugar, blood pressure and cholesterol control. Uncontrolled , managed by Endo, pot to contact endo re  elevated blood sugar  Diabetic Labs Latest Ref Rng & Units 05/26/2018 02/07/2018 01/18/2018 08/06/2017 06/14/2017  HbA1c 4.0 - 5.6 % 8.4(A) 8.0(A) - 8.9 -  Microalbumin Not Estab. ug/mL - - - - 19.5(H)  Micro/Creat Ratio 0.0 - 30.0 mg/g creat - - - - 11.5  Chol <200 mg/dL - - 167 - -  HDL >50 mg/dL - - 45(L) - -  Calc LDL mg/dL (calc) - - 100(H) - -  Triglycerides <150 mg/dL - - 127 - -  Creatinine 0.60 - 0.93 mg/dL - - 0.79 - -   BP/Weight 07/10/2018 05/26/2018 04/11/2018 02/07/2018 02/06/2018 62/37/6283 03/27/1759  Systolic BP 607 371 062 694 - 854 -  Diastolic BP 70 70 74 60 - 60 -  Wt. (Lbs) 180 190 193.6 188 189 191 195  BMI 29.95 31.62 32.22 31.28 31.45 31.78 32.45   Foot/eye exam completion dates Latest Ref Rng & Units 05/24/2018 01/10/2018  Eye Exam No Retinopathy No Retinopathy -  Foot exam Order - - -  Foot Form Completion - - Done

## 2018-07-16 NOTE — Assessment & Plan Note (Signed)
Acute episode of loose stool which cleared within 72 hrs,m which is reassuring , asymptomatic x 3 days. General education provided re gastroenteritis and the  Need to practice good hygiene and ensure adequate fluid intake

## 2018-07-26 ENCOUNTER — Ambulatory Visit (INDEPENDENT_AMBULATORY_CARE_PROVIDER_SITE_OTHER): Payer: Medicare Other | Admitting: Family Medicine

## 2018-07-26 ENCOUNTER — Other Ambulatory Visit: Payer: Self-pay

## 2018-07-26 ENCOUNTER — Encounter: Payer: Self-pay | Admitting: Family Medicine

## 2018-07-26 VITALS — BP 120/70 | Temp 99.1°F | Ht 65.0 in | Wt 184.0 lb

## 2018-07-26 DIAGNOSIS — G4489 Other headache syndrome: Secondary | ICD-10-CM | POA: Diagnosis not present

## 2018-07-26 DIAGNOSIS — J0140 Acute pansinusitis, unspecified: Secondary | ICD-10-CM | POA: Diagnosis not present

## 2018-07-26 MED ORDER — AZITHROMYCIN 250 MG PO TABS: ORAL_TABLET | ORAL | 0 refills | Status: DC

## 2018-07-26 NOTE — Patient Instructions (Signed)
    Thank you for completing your office visit today via telemedicine. I appreciate the opportunity to provide you with the care for your health and wellness. Today we discussed: Discussed your overall sinus health  Today your signs and symptoms are consistent with sinusitis.  Could be driven by allergies as well.  I have sent an antibiotic for you to take for the next 5 days.  Please take this as directed and complete in full course.  Please drink plenty of water to help wash out your sinuses and your body.  Please start taking loratadine daily.  You can continue to use the saline as needed. You can continue to take Tylenol and ibuprofen for headaches as needed.  If your symptoms fail to improve after completing the full course of antibiotics please call us back.  Or if you develop an ongoing fever, cough, shortness of breath, wheezing, or chest pain please call us.  Please continue to practice social distancing during this time to help you and our community stay safe.  [place] YOUR HANDS WELL AND FREQUENTLY. AVOID TOUCHING YOUR FACE, UNLESS YOUR HANDS ARE FRESHLY WASHED.  GET FRESH AIR DAILY. STAY HYDRATED WITH WATER.   It was a pleasure to see you and I look forward to continuing to work together on your health and well-being. Please do not hesitate to call the office if you need care or have questions about your care.  Have a wonderful day and week. With Gratitude, Cherly Beach, DNP, AGNP-BC

## 2018-07-26 NOTE — Progress Notes (Signed)
Virtual Visit via Telephone Note   This visit type was conducted due to national recommendations for restrictions regarding the COVID-19 Pandemic (e.g. social distancing) in an effort to limit this patient's exposure and mitigate transmission in our community.  Due to her co-morbid illnesses, this patient is at least at moderate risk for complications without adequate follow up.  This format is felt to be most appropriate for this patient at this time.  The patient did not have access to video technology/had technical difficulties with video requiring transitioning to audio format only (telephone).  All issues noted in this document were discussed and addressed.  No physical exam could be performed with this format.    Evaluation Performed:  Follow-up visit  Date:  07/26/2018   ID:  Jennifer Blackburn, DOB 09/14/1945, MRN [id number]  Patient Location: Home Provider Location: Other:  telemedicine  Location of Patient: Home Location of Provider: Telehealth Consent was obtain for visit to be over via telehealth. I verified that I am speaking with the correct person using two identifiers.  PCP:  Fayrene Helper, MD   Chief Complaint:  Ongoing headache  History of Present Illness:    Jennifer Blackburn is a 73 y.o. female with set of achiness in left thigh, with movement, tooth pain on left side, ear pain on left side.  And headache on left side.  Reports waking up with her left eye crusted over from drainage.  Reports having some discomfort with chewing.  Reports that she has frontal sinus pain and maxillary discomfort when she taps on it.  Additionally, she had a low-grade fever 99.1 over Monday and Tuesday.  All symptoms were onset on Sunday.  Nothing really aggravates it at this time.  She is tried to relieve it with the use of ibuprofen which help with some of her headache discomfort as Tylenol did not help with headache, loratadine, Mucinex reports that she did not really feel like she  had any allergy symptoms but took these and did have some mild improvement but with no symptoms she has stopped them.  Reported that the Mucinex helped her pretty well along with ibuprofen the most.  She reports that it comes and goes but if she does not take her medication it comes back.  When she does have a headache or the discomfort or pain she rates it 7-8 out of 10.  Denies having any dizziness, cough, chest pain, chest tightness, wheezing, shortness of breath, congestion, drainage from ears or nose.  Past Medical, Surgical, Social History, Allergies, and Medications have been Reviewed.  Past Medical History:  Diagnosis Date  . Anxiety   . CAD (coronary artery disease)   . Depression   . Diabetes mellitus type II    without complication  . DJD (degenerative joint disease) of lumbar spine   . Hypercholesteremia   . Hyperlipidemia   . Hypertension    benign   . Thyroid cancer ([place]) 2001   Past Surgical History:  Procedure Laterality Date  . ABDOMINAL HYSTERECTOMY    . CARDIAC CATHETERIZATION    . CATARACT EXTRACTION W/PHACO Right 07/17/2012   Procedure: CATARACT EXTRACTION PHACO AND INTRAOCULAR LENS PLACEMENT (IOC);  Surgeon: Tonny Branch, MD;  Location: [facility];  Service: Ophthalmology;  Laterality: Right;  CDE:25.51  . CATARACT EXTRACTION W/PHACO Left 09/17/2016   Procedure: CATARACT EXTRACTION PHACO AND INTRAOCULAR LENS PLACEMENT LEFT EYE;  Surgeon: Tonny Branch, MD;  Location: [facility];  Service: Ophthalmology;  Laterality: Left;  CDE: 19.23  . COLONOSCOPY    . COLONOSCOPY N/A 01/15/2014   Procedure: COLONOSCOPY;  Surgeon: Daneil Dolin, MD;  Location: [facility];  Service: Endoscopy;  Laterality: N/A;  9:00 AM  . DOPPLER ECHOCARDIOGRAPHY    . JOINT REPLACEMENT  07/01/2010   left hip  . KNEE SURGERY Right    arthroscopy  . left hip replaced  07/01/2010  . SPINE SURGERY  2006   cervical  . stress dipyridamole myocardial perfusion    . THYROIDECTOMY    . TONSILLECTOMY    .  VESICOVAGINAL FISTULA CLOSURE W/ TAH       Current Meds  Medication Sig  . aspirin EC 81 MG tablet Take 1 tablet (81 mg total) by mouth daily.  . ferrous sulfate 325 (65 FE) MG tablet Take 325 mg by mouth daily with breakfast.  . fluticasone (FLONASE) 50 MCG/ACT nasal spray Place 2 sprays into both nostrils daily.  Marland Kitchen gabapentin (NEURONTIN) 100 MG capsule TAKE 1 CAPSULE(100 MG) BY MOUTH THREE TIMES DAILY  . glucose blood (ACCU-CHEK AVIVA PLUS) test strip Use as instructed four times daily dx E11.65  . imipramine (TOFRANIL) 25 MG tablet TAKE 4 TABLETS BY MOUTH EVERY NIGHT AT BEDTIME  . Insulin Glargine (TOUJEO SOLOSTAR [place]) Inject 30-50 Units into the skin 2 (two) times daily.  . isosorbide mononitrate (IMDUR) 30 MG 24 hr tablet TAKE 1/2 TABLET(15 MG) BY MOUTH DAILY  . Lancets (ACCU-CHEK MULTICLIX) lancets Use as instructed three times daily dx 250.01  . levothyroxine (SYNTHROID) 200 MCG tablet Take 1 tablet (200 mcg total) by mouth daily before breakfast.  . loratadine (CLARITIN) 10 MG tablet Take 10 mg by mouth daily as needed for allergies.   . Menthol, Topical Analgesic, (ICY HOT EX) Apply 1 application topically 3 (three) times daily as needed (for back pain.).  Marland Kitchen metFORMIN (GLUCOPHAGE) 1000 MG tablet TAKE 1 TABLET TWICE A DAY  . metoprolol tartrate (LOPRESSOR) 50 MG tablet Take 1 tablet (50 mg total) by mouth 2 (two) times daily.  . Multiple Vitamin (MULTIVITAMIN) tablet Take 1 tablet by mouth daily.  Marland Kitchen PARoxetine (PAXIL) 20 MG tablet Take 1 tablet (20 mg total) by mouth daily. Patient reports that she takes it 1-2 times week.  . pravastatin (PRAVACHOL) 80 MG tablet TAKE 1 TABLET DAILY  . quinapril (ACCUPRIL) 10 MG tablet Take 1 tablet (10 mg total) by mouth daily.  . Semaglutide,0.25 or 0.5MG /DOS, (OZEMPIC, 0.25 OR 0.5 MG/DOSE,) 2 MG/1.5ML SOPN Inject 0.5 mg into the skin once a week.  . verapamil (CALAN-SR) 180 MG CR tablet TAKE 1 TABLET(180 MG) BY MOUTH AT BEDTIME  . vitamin B-12  (CYANOCOBALAMIN) 1000 MCG tablet Take 1 tablet (1,000 mcg total) by mouth daily.  Marland Kitchen VITAMIN D, CHOLECALCIFEROL, PO Take 1 capsule by mouth daily.     Allergies:   Daypro [oxaprozin] and Sulfonamide derivatives   Social History   Tobacco Use  . Smoking status: Passive Smoke Exposure - Never Smoker  . Smokeless tobacco: Never Used  . Tobacco comment: Husband smokes in the home  Substance Use Topics  . Alcohol use: No  . Drug use: No     Family Hx: The patient's family history includes Asthma in her daughter; Heart attack in her father; Heart failure in her mother; Sleep apnea in her son. There is no history of Colon cancer.  ROS:    Review of Systems  Constitutional: Positive for fever. Negative for activity change, appetite change and chills.  Low grade 99.1 Monday and Tuesday   HENT: Positive for dental problem, ear pain and sinus pressure.        Left eye, and ear, and dental pain  Eyes: Positive for pain and discharge.  Respiratory: Negative for cough, chest tightness, shortness of breath and wheezing.   Cardiovascular: Negative for chest pain, palpitations and leg swelling.  Gastrointestinal: Negative.   Genitourinary: Negative.   Musculoskeletal: Negative.   Skin: Negative.   Allergic/Immunologic: Negative for environmental allergies.  Neurological: Positive for headaches. Negative for dizziness.  Hematological: Negative.   Psychiatric/Behavioral: Negative.   All other systems reviewed and are negative.   Labs/Other Tests and Data Reviewed:    Recent Labs: 01/18/2018: ALT 14; BUN 11; Creat 0.79; Hemoglobin 11.6; Platelets 275; Potassium 4.8; Sodium 138 05/26/2018: TSH 0.61   Recent Lipid Panel Lab Results  Component Value Date/Time   CHOL 167 01/18/2018 10:01 AM   TRIG 127 01/18/2018 10:01 AM   HDL 45 (L) 01/18/2018 10:01 AM   CHOLHDL 3.7 01/18/2018 10:01 AM   LDLCALC 100 (H) 01/18/2018 10:01 AM    Wt Readings from Last 3 Encounters:  07/26/18 184 lb  (83.5 kg)  07/10/18 180 lb (81.6 kg)  05/26/18 190 lb (86.2 kg)     Objective:    Vital Signs:  BP 120/70   Temp 99.1 F (37.3 C) (Axillary) Comment (Src): 07/25/2018  Ht 5\' 5"  (1.651 m)   Wt 184 lb (83.5 kg)   LMP 11/11/2016   BMI 30.62 kg/m    GEN:  alert and oriented RESPIRATORY:  No signs of shortness of breath during conversation PSYCH:  Normal mood, affect, memory intact, good communication during visit.  ASSESSMENT & PLAN:   1. Acute non-recurrent pansinusitis Symptoms today are consistent with sinusitis.  Advised to take Zithromax as directed.  And to complete the full course of antibiotics.  Reviewed side effects, risks and benefits of medication.  She is advised to call or return to the clinic if she has a elevated temperature that stays elevated longer than 24 to 48 hours, cough, wheezing, chest congestion, chest pain shortness of breath development. Patient acknowledged agreement and understanding of the plan.   - azithromycin (ZITHROMAX Z-PAK) 250 MG tablet; Take 2 tablets (500 mg) on  Day 1,  followed by 1 tablet (250 mg) once daily on Days 2 through 5. Complete full course.  Dispense: 6 each; Refill: 0  2. Other headache syndrome Reports having ongoing headache possibly secondary to sinusitis.  Does have history of having headaches.  Advised to take Tylenol and ibuprofen as needed for this discomfort.  If this is related to the sinusitis this will be cleared up when she finishes her antibiotic.  Time:   Today, I have spent 10 minutes with the patient with telehealth technology discussing the above problems.     Medication Adjustments/Labs and Tests Ordered: Current medicines are reviewed at length with the patient today.  Concerns regarding medicines are outlined above.   Tests Ordered: No orders of the defined types were placed in this encounter.   Medication Changes: Meds ordered this encounter  Medications  . azithromycin (ZITHROMAX Z-PAK) 250 MG tablet     Sig: Take 2 tablets (500 mg) on  Day 1,  followed by 1 tablet (250 mg) once daily on Days 2 through 5. Complete full course.    Dispense:  6 each    Refill:  0    Order Specific Question:   Supervising Provider  Answer:   Fayrene Helper [[id number]]    Disposition:  Follow up prn  Signed, Perlie Mayo, NP  07/26/2018 11:16 AM     [address]

## 2018-08-02 ENCOUNTER — Ambulatory Visit (INDEPENDENT_AMBULATORY_CARE_PROVIDER_SITE_OTHER): Payer: Medicare Other | Admitting: Family Medicine

## 2018-08-02 ENCOUNTER — Encounter: Payer: Self-pay | Admitting: Family Medicine

## 2018-08-02 VITALS — BP 120/70 | Ht 65.0 in | Wt 184.0 lb

## 2018-08-02 DIAGNOSIS — Z0000 Encounter for general adult medical examination without abnormal findings: Secondary | ICD-10-CM

## 2018-08-02 NOTE — Progress Notes (Signed)
Subjective:   Jennifer Blackburn is a 73 y.o. female who presents for Medicare Annual (Subsequent) preventive examination.  Location of Patient: Home Location of Provider: Telehealth Consent was obtain for visit to be over via telehealth.  I verified that I am speaking with the correct person using two identifiers.   Review of Systems:    Cardiac Risk Factors include: advanced age (>31men, >76 women);diabetes mellitus;dyslipidemia;hypertension;obesity (BMI >30kg/m2)     Objective:     Vitals: BP 120/70   Ht 5\' 5"  (1.651 m)   Wt 184 lb (83.5 kg)   LMP 11/11/2016   BMI 30.62 kg/m   Body mass index is 30.62 kg/m.  Advanced Directives 10/12/2017 01/03/2017 12/09/2016 12/08/2016 09/17/2016 02/11/2015 02/01/2014  Does Patient Have a Medical Advance Directive? No No No No No No No  Would patient like information on creating a medical advance directive? No - Patient declined Yes (MAU/Ambulatory/Procedural Areas - Information given) (No Data) No - Patient declined No - Patient declined No - patient declined information No - patient declined information  Pre-existing out of facility DNR order (yellow form or pink MOST form) - - - - - - -    Tobacco Social History   Tobacco Use  Smoking Status Passive Smoke Exposure - Never Smoker  Smokeless Tobacco Never Used  Tobacco Comment   Husband smokes in the home     Counseling given: Yes Comment: Husband smokes in the home   Clinical Intake:  Pre-visit preparation completed: Yes  Pain : No/denies pain     BMI - recorded: 30.62 Nutritional Risks: None Diabetes: Yes CBG done?: No Did pt. bring in CBG monitor from home?: No  How often do you need to have someone help you when you read instructions, pamphlets, or other written materials from your doctor or pharmacy?: 1 - Never What is the last grade level you completed in school?: high school  Interpreter Needed?: No     Past Medical History:  Diagnosis Date  . Anxiety   .  CAD (coronary artery disease)   . Depression   . Diabetes mellitus type II    without complication  . DJD (degenerative joint disease) of lumbar spine   . Hypercholesteremia   . Hyperlipidemia   . Hypertension    benign   . Thyroid cancer ([place]) 2001   Past Surgical History:  Procedure Laterality Date  . ABDOMINAL HYSTERECTOMY    . CARDIAC CATHETERIZATION    . CATARACT EXTRACTION W/PHACO Right 07/17/2012   Procedure: CATARACT EXTRACTION PHACO AND INTRAOCULAR LENS PLACEMENT (IOC);  Surgeon: Tonny Branch, MD;  Location: [facility];  Service: Ophthalmology;  Laterality: Right;  CDE:25.51  . CATARACT EXTRACTION W/PHACO Left 09/17/2016   Procedure: CATARACT EXTRACTION PHACO AND INTRAOCULAR LENS PLACEMENT LEFT EYE;  Surgeon: Tonny Branch, MD;  Location: [facility];  Service: Ophthalmology;  Laterality: Left;  CDE: 19.23  . COLONOSCOPY    . COLONOSCOPY N/A 01/15/2014   Procedure: COLONOSCOPY;  Surgeon: Daneil Dolin, MD;  [facility];  Service: Endoscopy;  Laterality: N/A;  9:00 AM  . DOPPLER ECHOCARDIOGRAPHY    . JOINT REPLACEMENT  07/01/2010   left hip  . KNEE SURGERY Right    arthroscopy  . left hip replaced  07/01/2010  . SPINE SURGERY  2006   cervical  . stress dipyridamole myocardial perfusion    . THYROIDECTOMY    . TONSILLECTOMY    . VESICOVAGINAL FISTULA CLOSURE W/ TAH     Family History  Problem Relation Age of Onset  . Heart attack Father   . Heart failure Mother   . Asthma Daughter   . Sleep apnea Son        CPAP  . Colon cancer Neg Hx    Social History   Socioeconomic History  . Marital status: Married    Spouse name: Not on file  . Number of children: 2  . Years of education: 110  . Highest education level: Not on file  Occupational History  . Occupation: retired     Comment: Company secretary  . Financial resource strain: Not hard at all  . Food insecurity:    Worry: Never true    Inability: Never true  . Transportation needs:    Medical: No     Non-medical: No  Tobacco Use  . Smoking status: Passive Smoke Exposure - Never Smoker  . Smokeless tobacco: Never Used  . Tobacco comment: Husband smokes in the home  Substance and Sexual Activity  . Alcohol use: No  . Drug use: No  . Sexual activity: Never  Lifestyle  . Physical activity:    Days per week: 5 days    Minutes per session: 40 min  . Stress: Not at all  Relationships  . Social connections:    Talks on phone: More than three times a week    Gets together: Three times a week    Attends religious service: More than 4 times per year    Active member of club or organization: Yes    Attends meetings of clubs or organizations: More than 4 times per year    Relationship status: Married  Other Topics Concern  . Not on file  Social History Narrative   Lives with husband, at home   No caffeine    Outpatient Encounter Medications as of 08/02/2018  Medication Sig  . aspirin EC 81 MG tablet Take 1 tablet (81 mg total) by mouth daily.  . ferrous sulfate 325 (65 FE) MG tablet Take 325 mg by mouth daily with breakfast.  . fluticasone (FLONASE) 50 MCG/ACT nasal spray Place 2 sprays into both nostrils daily.  Marland Kitchen gabapentin (NEURONTIN) 100 MG capsule TAKE 1 CAPSULE(100 MG) BY MOUTH THREE TIMES DAILY  . glucose blood (ACCU-CHEK AVIVA PLUS) test strip Use as instructed four times daily dx E11.65  . imipramine (TOFRANIL) 25 MG tablet TAKE 4 TABLETS BY MOUTH EVERY NIGHT AT BEDTIME  . Insulin Glargine (TOUJEO SOLOSTAR [place]) Inject 30-50 Units into the skin 2 (two) times daily.  . isosorbide mononitrate (IMDUR) 30 MG 24 hr tablet TAKE 1/2 TABLET(15 MG) BY MOUTH DAILY  . Lancets (ACCU-CHEK MULTICLIX) lancets Use as instructed three times daily dx 250.01  . levothyroxine (SYNTHROID) 200 MCG tablet Take 1 tablet (200 mcg total) by mouth daily before breakfast.  . loratadine (CLARITIN) 10 MG tablet Take 10 mg by mouth daily as needed for allergies.   . Menthol, Topical Analgesic, (ICY HOT EX)  Apply 1 application topically 3 (three) times daily as needed (for back pain.).  Marland Kitchen metFORMIN (GLUCOPHAGE) 1000 MG tablet TAKE 1 TABLET TWICE A DAY  . metoprolol tartrate (LOPRESSOR) 50 MG tablet Take 1 tablet (50 mg total) by mouth 2 (two) times daily.  . Multiple Vitamin (MULTIVITAMIN) tablet Take 1 tablet by mouth daily.  Marland Kitchen PARoxetine (PAXIL) 20 MG tablet Take 1 tablet (20 mg total) by mouth daily. Patient reports that she takes it 1-2 times week.  . pravastatin (PRAVACHOL) 80 MG  tablet TAKE 1 TABLET DAILY  . quinapril (ACCUPRIL) 10 MG tablet Take 1 tablet (10 mg total) by mouth daily.  . Semaglutide,0.25 or 0.5MG /DOS, (OZEMPIC, 0.25 OR 0.5 MG/DOSE,) 2 MG/1.5ML SOPN Inject 0.5 mg into the skin once a week.  . verapamil (CALAN-SR) 180 MG CR tablet TAKE 1 TABLET(180 MG) BY MOUTH AT BEDTIME  . vitamin B-12 (CYANOCOBALAMIN) 1000 MCG tablet Take 1 tablet (1,000 mcg total) by mouth daily.  Marland Kitchen VITAMIN D, CHOLECALCIFEROL, PO Take 1 capsule by mouth daily.  . [DISCONTINUED] azithromycin (ZITHROMAX Z-PAK) 250 MG tablet Take 2 tablets (500 mg) on  Day 1,  followed by 1 tablet (250 mg) once daily on Days 2 through 5. Complete full course. (Patient not taking: Reported on 08/02/2018)   No facility-administered encounter medications on file as of 08/02/2018.     Activities of Daily Living In your present state of health, do you have any difficulty performing the following activities: 08/02/2018  Hearing? N  Vision? N  Difficulty concentrating or making decisions? N  Walking or climbing stairs? N  Dressing or bathing? N  Doing errands, shopping? N  Preparing Food and eating ? N  Using the Toilet? N  In the past six months, have you accidently leaked urine? N  Do you have problems with loss of bowel control? N  Managing your Medications? N  Managing your Finances? N  Housekeeping or managing your Housekeeping? N  Some recent data might be hidden    Patient Care Team: Fayrene Helper, MD as PCP -  General Gwenlyn Found Pearletha Forge, MD as PCP - Cardiology (Cardiology) Sherlynn Stalls, MD (Ophthalmology) Lorretta Harp, MD as Consulting Physician (Cardiology) Ronnald Collum, Lourdes Sledge, MD as Attending Physician (Endocrinology) Gala Romney Cristopher Estimable, MD as Consulting Physician (Gastroenterology) Cassandria Anger, MD as Consulting Physician (Endocrinology)    Assessment:   This is a routine wellness examination for [place].  Exercise Activities and Dietary recommendations Current Exercise Habits: Home exercise routine, Type of exercise: walking, Time (Minutes): 40, Frequency (Times/Week): 5, Weekly Exercise (Minutes/Week): 200, Intensity: Mild, Exercise limited by: None identified  Goals    . Blood Pressure < 140/90    . Exercise 150 minutes per week (moderate activity)    . HEMOGLOBIN A1C < 7.0    . LDL CALC < 70       Fall Risk Fall Risk  08/02/2018 07/26/2018 02/07/2018 01/10/2018 10/25/2017  Falls in the past year? 0 0 0 No No  Number falls in past yr: - - - - -  Injury with Fall? 0 0 - - -  Comment - - - - -  Risk Factor Category  - - - - -  Risk for fall due to : - - - - -  Risk for fall due to: Comment - - - - -  Follow up - - - - -  Comment - - - - -   Is the patient's home free of loose throw rugs in walkways, pet beds, electrical cords, etc?   yes      Grab bars in the bathroom? yes      Handrails on the stairs?   yes      Adequate lighting?   yes  Timed Get Up and Go performed: telemedicine-telephone not performed   Depression Screen PHQ 2/9 Scores 08/02/2018 07/26/2018 02/07/2018 01/10/2018  PHQ - 2 Score 0 0 1 0  PHQ- 9 Score - - - -     Cognitive Function  6CIT Screen 08/02/2018 01/03/2017  What Year? 0 points 0 points  What month? 0 points 0 points  What time? 0 points 0 points  Count back from 20 0 points 0 points  Months in reverse 0 points 0 points  Repeat phrase 0 points 0 points  Total Score 0 0    Immunization History  Administered Date(s) Administered   . Influenza Split 03/10/2012  . Influenza Whole 01/04/2007  . Influenza,inj,Quad PF,6+ Mos 11/28/2012, 12/31/2013, 01/31/2017, 01/10/2018  . Influenza-Unspecified 02/09/2016  . Pneumococcal Conjugate-13 07/15/2014  . Pneumococcal Polysaccharide-23 02/17/2004, 09/07/2012  . Tdap 12/21/2010    Qualifies for Shingles Vaccine? yes  Screening Tests Health Maintenance  Topic Date Due  . INFLUENZA VACCINE  10/21/2018  . HEMOGLOBIN A1C  11/26/2018  . FOOT EXAM  01/16/2019  . OPHTHALMOLOGY EXAM  05/24/2019  . MAMMOGRAM  11/24/2019  . TETANUS/TDAP  12/20/2020  . COLONOSCOPY  01/16/2024  . DEXA SCAN  Completed  . Hepatitis C Screening  Completed  . PNA vac Low Risk Adult  Completed    Cancer Screenings: Lung: Low Dose CT Chest recommended if Age 36-80 years, 30 pack-year currently smoking OR have quit w/in 15years. Patient does not qualify. Breast:  Up to date on Mammogram? Yes   Up to date of Bone Density/Dexa? Yes Colorectal: Due 2025  Additional Screenings:   Hepatitis C Screening: completed      Plan:       1. Encounter for Medicare annual wellness exam  I have personally reviewed and noted the following in the patient's chart:   . Medical and social history . Use of alcohol, tobacco or illicit drugs  . Current medications and supplements . Functional ability and status . Nutritional status . Physical activity . Advanced directives . List of other physicians . Hospitalizations, surgeries, and ER visits in previous 12 months . Vitals . Screenings to include cognitive, depression, and falls . Referrals and appointments  In addition, I have reviewed and discussed with patient certain preventive protocols, quality metrics, and best practice recommendations. A written personalized care plan for preventive services as well as general preventive health recommendations were provided to patient.     I provided 20 minutes of non-face-to-face time during this encounter.     Perlie Mayo, NP  08/02/2018

## 2018-08-02 NOTE — Patient Instructions (Addendum)
Jennifer Blackburn , Thank you for taking time to come for your Medicare Wellness Visit. I appreciate your ongoing commitment to your health goals. Please review the following plan we discussed and let me know if I can assist you in the future.   I will be praying for good news on your scan. We are here if you need anything. Glad you are feeling better from sinus infection.   Screening recommendations/referrals: Colonoscopy: Up to date, next due 12/2018 Mammogram: Up to date, next due 10/2018 Bone Density: Up to date Diabetic Eye Exam: Up to date Recommended yearly dental visit for hygiene and checkup  Vaccinations: Influenza vaccine: Due Fall 2020 Pneumococcal vaccine: Up to date Tdap vaccine: Up to date, next due 12/2020 Shingles vaccine: Discuss with Dr Moshe Cipro    Advanced directives: previously   Conditions/risks identified: Obese, recommend starting a routine exercise program at least 3 days a week for 30-45 minutes at a time as tolerated.   Next appointment: 12/18/2018 at 1 pm with Dr Moshe Cipro for 6 month follow up  Preventive Care 74 Years and Older, Female Preventive care refers to lifestyle choices and visits with your health care provider that can promote health and wellness. What does preventive care include?  A yearly physical exam. This is also called an annual well check.  Dental exams once or twice a year.  Routine eye exams. Ask your health care provider how often you should have your eyes checked.  Personal lifestyle choices, including:  Daily care of your teeth and gums.  Regular physical activity.  Eating a healthy diet.  Avoiding tobacco and drug use.  Limiting alcohol use.  Practicing safe sex.  Taking low-dose aspirin every day.  Taking vitamin and mineral supplements as recommended by your health care provider. What happens during an annual well check? The services and screenings done by your health care provider during your annual well check will  depend on your age, overall health, lifestyle risk factors, and family history of disease. Counseling  Your health care provider may ask you questions about your:  Alcohol use.  Tobacco use.  Drug use.  Emotional well-being.  Home and relationship well-being.  Sexual activity.  Eating habits.  History of falls.  Memory and ability to understand (cognition).  Work and work Statistician.  Reproductive health. Screening  You may have the following tests or measurements:  Height, weight, and BMI.  Blood pressure.  Lipid and cholesterol levels. These may be checked every 5 years, or more frequently if you are over 37 years old.  Skin check.  Lung cancer screening. You may have this screening every year starting at age 43 if you have a 30-pack-year history of smoking and currently smoke or have quit within the past 15 years.  Fecal occult blood test (FOBT) of the stool. You may have this test every year starting at age 8.  Flexible sigmoidoscopy or colonoscopy. You may have a sigmoidoscopy every 5 years or a colonoscopy every 10 years starting at age 44.  Hepatitis C blood test.  Hepatitis B blood test.  Sexually transmitted disease (STD) testing.  Diabetes screening. This is done by checking your blood sugar (glucose) after you have not eaten for a while (fasting). You may have this done every 1-3 years.  Bone density scan. This is done to screen for osteoporosis. You may have this done starting at age 32.  Mammogram. This may be done every 1-2 years. Talk to your health care provider about how often  you should have regular mammograms. Talk with your health care provider about your test results, treatment options, and if necessary, the need for more tests. Vaccines  Your health care provider may recommend certain vaccines, such as:  Influenza vaccine. This is recommended every year.  Tetanus, diphtheria, and acellular pertussis (Tdap, Td) vaccine. You may need a  Td booster every 10 years.  Zoster vaccine. You may need this after age 71.  Pneumococcal 13-valent conjugate (PCV13) vaccine. One dose is recommended after age 55.  Pneumococcal polysaccharide (PPSV23) vaccine. One dose is recommended after age 74. Talk to your health care provider about which screenings and vaccines you need and how often you need them. This information is not intended to replace advice given to you by your health care provider. Make sure you discuss any questions you have with your health care provider. Document Released: 04/04/2015 Document Revised: 11/26/2015 Document Reviewed: 01/07/2015 Elsevier Interactive Patient Education  2017 [place] Prevention in the Home Falls can cause injuries. They can happen to people of all ages. There are many things you can do to make your home safe and to help prevent falls. What can I do on the outside of my home?  Regularly fix the edges of walkways and driveways and fix any cracks.  Remove anything that might make you trip as you walk through a door, such as a raised step or threshold.  Trim any bushes or trees on the path to your home.  Use bright outdoor lighting.  Clear any walking paths of anything that might make someone trip, such as rocks or tools.  Regularly check to see if handrails are loose or broken. Make sure that both sides of any steps have handrails.  Any raised decks and porches should have guardrails on the edges.  Have any leaves, snow, or ice cleared regularly.  Use sand or salt on walking paths during winter.  Clean up any spills in your garage right away. This includes oil or grease spills. What can I do in the bathroom?  Use night lights.  Install grab bars by the toilet and in the tub and shower. Do not use towel bars as grab bars.  Use non-skid mats or decals in the tub or shower.  If you need to sit down in the shower, use a plastic, non-slip stool.  Keep the floor dry. Clean  up any water that spills on the floor as soon as it happens.  Remove soap buildup in the tub or shower regularly.  Attach bath mats securely with double-sided non-slip rug tape.  Do not have throw rugs and other things on the floor that can make you trip. What can I do in the bedroom?  Use night lights.  Make sure that you have a light by your bed that is easy to reach.  Do not use any sheets or blankets that are too big for your bed. They should not hang down onto the floor.  Have a firm chair that has side arms. You can use this for support while you get dressed.  Do not have throw rugs and other things on the floor that can make you trip. What can I do in the kitchen?  Clean up any spills right away.  Avoid walking on wet floors.  Keep items that you use a lot in easy-to-reach places.  If you need to reach something above you, use a strong step stool that has a grab bar.  Keep electrical cords  out of the way.  Do not use floor polish or wax that makes floors slippery. If you must use wax, use non-skid floor wax.  Do not have throw rugs and other things on the floor that can make you trip. What can I do with my stairs?  Do not leave any items on the stairs.  Make sure that there are handrails on both sides of the stairs and use them. Fix handrails that are broken or loose. Make sure that handrails are as long as the stairways.  Check any carpeting to make sure that it is firmly attached to the stairs. Fix any carpet that is loose or worn.  Avoid having throw rugs at the top or bottom of the stairs. If you do have throw rugs, attach them to the floor with carpet tape.  Make sure that you have a light switch at the top of the stairs and the bottom of the stairs. If you do not have them, ask someone to add them for you. What else can I do to help prevent falls?  Wear shoes that:  Do not have high heels.  Have rubber bottoms.  Are comfortable and fit you well.  Are  closed at the toe. Do not wear sandals.  If you use a stepladder:  Make sure that it is fully opened. Do not climb a closed stepladder.  Make sure that both sides of the stepladder are locked into place.  Ask someone to hold it for you, if possible.  Clearly mark and make sure that you can see:  Any grab bars or handrails.  First and last steps.  Where the edge of each step is.  Use tools that help you move around (mobility aids) if they are needed. These include:  Canes.  Walkers.  Scooters.  Crutches.  Turn on the lights when you go into a dark area. Replace any light bulbs as soon as they burn out.  Set up your furniture so you have a clear path. Avoid moving your furniture around.  If any of your floors are uneven, fix them.  If there are any pets around you, be aware of where they are.  Review your medicines with your doctor. Some medicines can make you feel dizzy. This can increase your chance of falling. Ask your doctor what other things that you can do to help prevent falls. This information is not intended to replace advice given to you by your health care provider. Make sure you discuss any questions you have with your health care provider. Document Released: 01/02/2009 Document Revised: 08/14/2015 Document Reviewed: 04/12/2014 Elsevier Interactive Patient Education  2017 [employer].

## 2018-08-24 ENCOUNTER — Telehealth: Payer: Self-pay | Admitting: Internal Medicine

## 2018-08-24 DIAGNOSIS — E038 Other specified hypothyroidism: Secondary | ICD-10-CM

## 2018-08-24 MED ORDER — LEVOTHYROXINE SODIUM 200 MCG PO TABS: 200.0000 ug | ORAL_TABLET | Freq: Every day | ORAL | 5 refills | Status: DC

## 2018-08-24 NOTE — Telephone Encounter (Signed)
RX sent

## 2018-08-24 NOTE — Telephone Encounter (Signed)
MEDICATION: levothyroxine (SYNTHROID) 200 MCG tablet  PHARMACY:  [facility] [phone]   IS THIS A 90 DAY SUPPLY :   IS PATIENT OUT OF MEDICATION: Yes  IF NOT; HOW MUCH IS LEFT:   LAST APPOINTMENT DATE: @3 /24/2020  NEXT APPOINTMENT DATE:@7 /04/2018  DO WE HAVE YOUR PERMISSION TO LEAVE A DETAILED MESSAGE:  OTHER COMMENTS:    **Let patient know to contact pharmacy at the end of the day to make sure medication is ready. **  ** Please notify patient to allow 48-72 hours to process**  **Encourage patient to contact the pharmacy for refills or they can request refills through [facility]**

## 2018-09-01 DIAGNOSIS — M2040 Other hammer toe(s) (acquired), unspecified foot: Secondary | ICD-10-CM | POA: Diagnosis not present

## 2018-09-01 DIAGNOSIS — E1142 Type 2 diabetes mellitus with diabetic polyneuropathy: Secondary | ICD-10-CM | POA: Diagnosis not present

## 2018-09-01 DIAGNOSIS — L851 Acquired keratosis [keratoderma] palmaris et plantaris: Secondary | ICD-10-CM | POA: Diagnosis not present

## 2018-09-19 ENCOUNTER — Other Ambulatory Visit: Payer: Self-pay

## 2018-09-21 ENCOUNTER — Other Ambulatory Visit: Payer: Self-pay

## 2018-09-21 ENCOUNTER — Encounter: Payer: Self-pay | Admitting: Internal Medicine

## 2018-09-21 ENCOUNTER — Ambulatory Visit (INDEPENDENT_AMBULATORY_CARE_PROVIDER_SITE_OTHER): Payer: Medicare Other | Admitting: Internal Medicine

## 2018-09-21 VITALS — BP 110/60 | HR 60 | Ht 64.5 in | Wt 187.0 lb

## 2018-09-21 DIAGNOSIS — R221 Localized swelling, mass and lump, neck: Secondary | ICD-10-CM | POA: Diagnosis not present

## 2018-09-21 DIAGNOSIS — I2510 Atherosclerotic heart disease of native coronary artery without angina pectoris: Secondary | ICD-10-CM | POA: Diagnosis not present

## 2018-09-21 DIAGNOSIS — E890 Postprocedural hypothyroidism: Secondary | ICD-10-CM | POA: Diagnosis not present

## 2018-09-21 DIAGNOSIS — I2583 Coronary atherosclerosis due to lipid rich plaque: Secondary | ICD-10-CM | POA: Diagnosis not present

## 2018-09-21 DIAGNOSIS — C73 Malignant neoplasm of thyroid gland: Secondary | ICD-10-CM

## 2018-09-21 DIAGNOSIS — E1159 Type 2 diabetes mellitus with other circulatory complications: Secondary | ICD-10-CM | POA: Diagnosis not present

## 2018-09-21 LAB — TSH: TSH: 0.06 u[IU]/mL — ABNORMAL LOW (ref 0.35–4.50)

## 2018-09-21 LAB — POCT GLYCOSYLATED HEMOGLOBIN (HGB A1C): Hemoglobin A1C: 8.9 % — AB (ref 4.0–5.6)

## 2018-09-21 LAB — T4, FREE: Free T4: 1.26 ng/dL (ref 0.60–1.60)

## 2018-09-21 MED ORDER — OZEMPIC (0.25 OR 0.5 MG/DOSE) 2 MG/1.5ML ~~LOC~~ SOPN: 0.5000 mg | PEN_INJECTOR | SUBCUTANEOUS | 2 refills | Status: DC

## 2018-09-21 NOTE — Patient Instructions (Signed)
Please continue: - Metformin 1000 mg 2x a day with meals. - Toujeo 30 units in a.m. and 50 units at bedtime  Restart: - Ozempic 0.25 mg weekly in a.m. x 2 doses, then 0.5 mg weekly  Please continue levothyroxine 200 mcg daily.  Take the thyroid hormone every day, with water, at least 30 minutes before breakfast, separated by at least 4 hours from: - acid reflux medications - calcium - iron - multivitamins  Please stop at the lab.  Please return in 3 months with your sugar log.

## 2018-09-21 NOTE — Progress Notes (Addendum)
Patient ID: Jennifer Blackburn, female   DOB: [birth year], 73 y.o.   MRN: [id number]   HPI: Jennifer Blackburn is a 73 y.o.-year-old female, returning for follow-up for DM2, dx in 2001, insulin-dependent since 2017, uncontrolled, with long-term complications (DR, CAD) and also papillary thyroid cancer and postsurgical hypothyroidism.  Last visit 4 months ago.  After last visit, she called [place] with high blood sugars after dinner.  I advised her to add 8 to 10 units of rapid acting insulin before this meal, but she did not want to start this yet.  Moreover, she stopped Ozempic 3 months ago after she developed significant constipation due to increasing the dose at last visit.  DM2: Reviewed latest HbA1c level: Lab Results  Component Value Date   HGBA1C 8.4 (A) 05/26/2018   HGBA1C 8.0 (A) 02/07/2018   HGBA1C 8.9 08/06/2017   HGBA1C 8.0 10/20/2015   HGBA1C 9.0 (H) 11/14/2014   HGBA1C 9.0 (H) 07/10/2014   HGBA1C 9.2 (H) 12/28/2013   HGBA1C 8.0 (H) 07/30/2013   HGBA1C 9.1 (H) 05/01/2013   HGBA1C 8.0 (H) 09/04/2012  01/12/2018: HbA1c 8.7% 08/05/2017: HbA1c 8.9% 07/11/2017: HbA1c 9.1%  Pt is on a regimen of: - Metformin 1000 mg 2x a day with meals. - Toujeo 30 units in a.m. and 50 units at bedtime She was on Ozempic 0.5 mg weekly in a.m. >> 1 mg  - constipation >> stopped 06/2018. She was on Victoza >> $$$. She was on Jardiance >> $$$. She was on Actos >> swelling - stopped Spring 2019. She was on Glipizide XL 5 mg daily >> stopped 07/2017.  Pt checks her sugars 1-2 times a day: - am: 150-180 >> 137-163 >> 110-160 - 2h after b'fast: n/c - before lunch: n/c - 2h after lunch: n/c - before dinner: n/c - 2h after dinner: n/c - bedtime: 198-210, 320 after eating out >> 230-240 >> 200s - nighttime: n/c Lowest sugar was 110 >> 137; she has hypoglycemia awareness in the 60s. Highest sugar was 320 >> 240 .  Glucometer: AccuChek Aviva  Pt's meals are: - Breakfast:toast, cereal, milk; oatmeal;  bacon + egg - Lunch: skips  - Dinner: hamburger, or chicken, or fish, + veggies, bread, water and tea - Snacks: 2- fruit She was seeing a dietitian in [place]. She walks daily.  -No CKD, last BUN/creatinine:  Lab Results  Component Value Date   BUN 11 01/18/2018   BUN 10 06/09/2017   CREATININE 0.79 01/18/2018   CREATININE 0.61 06/09/2017  On quinapril 10.  -+ HL; last set of lipids: Lab Results  Component Value Date   CHOL 167 01/18/2018   HDL 45 (L) 01/18/2018   LDLCALC 100 (H) 01/18/2018   TRIG 127 01/18/2018   CHOLHDL 3.7 01/18/2018  On pravastatin 80.  - last eye exam was in 05/2018:?  + DR. Dr. Eulas Post in [place]. + cataract sx in both eyes.  -No numbness and tingling in her feet. Has a podiatrist.   On ASA 81.  Pt has no FH of DM.  She also has a distant h/o Thyroid cancer (2001), s/p RAI treatment, now with postsurgical hypothyroidism.  Pt is on levothyroxine 200 mcg daily, taken: - in am - fasting - at least 30 min from b'fast - no Ca, MVI, PPIs - + Fe 4h later - not on Biotin  Reviewed her TFTs: Lab Results  Component Value Date   TSH 0.61 05/26/2018   TSH 2.49 03/23/2016   TSH 0.070 (L) 10/13/2010  TSH 0.035 (L) 09/22/2006   TSH 0.015 (L) 06/17/2006  11/03/2017: TSH 0.044  At last visit, her thyroglobulin was detectable: Lab Results  Component Value Date   THYROGLB 0.7 (L) 05/26/2018   Lab Results  Component Value Date   THGAB <1 05/26/2018   Thyroid ultrasound (06/10/2018): 1.1 cm nonspecific calcified lesion in the right upper neck.  CT was recommended: There is no residual or recurrent tissue in the right or left thyroid beds. There is no evidence of abnormal adenopathy by short axis diameter measurement criteria There is a nonspecific calcified soft tissue mass measuring 1.1 cm in the right superior neck. This is of unknown significance. CT neck can be performed to further characterize.  I advised patient to have a whole-body scan  first, before obtaining a CT scan of her neck.  She did not have it yet.  Records per Dr. Ronnald Collum: Patient is status post total thyroidectomy in 2004 1.3 cm papillary thyroid cancer of the right lobe, followed by 3x RAI tx's with  33 mCi I-131 and a fourth RAI dose inpatient: 125 mCi, at [facility].    Whole-body scan on 09/08/2007 showed an increase in the thyroid bed activity and the follow up study with PET scan 10/08/2007 showed mildly hypermetabolic cervical lymph nodes.    Repeat PET 05/10/2008 showed no significant changes felt likely to be due to to a reactive process.  11/03/2017: TSH 0.044, thyroglobulin 0.2, ATA <1.0  04/05/2017: TSH 0.021 (0.45-4.5), thyroglobulin 0.3 (by IMA), ATA <1.0  She also has a history of HTN, anemia.  Her daughter lives in [place] and she is sick.  Patient travels between here in [place] frequently.   ROS: Constitutional: no weight gain/no weight loss, no fatigue, no subjective hyperthermia, no subjective hypothermia Eyes: no blurry vision, no xerophthalmia ENT: no sore throat, + see HPI Cardiovascular: no CP/no SOB/no palpitations/no leg swelling Respiratory: no cough/no SOB/no wheezing Gastrointestinal: no N/no V/no D/no C/no acid reflux Musculoskeletal: no muscle aches/no joint aches Skin: no rashes, no hair loss Neurological: no tremors/no numbness/no tingling/no dizziness  I reviewed pt's medications, allergies, PMH, social hx, family hx, and changes were documented in the history of present illness. Otherwise, unchanged from my initial visit note.  Past Medical History:  Diagnosis Date  . Anxiety   . CAD (coronary artery disease)   . Depression   . Diabetes mellitus type II    without complication  . DJD (degenerative joint disease) of lumbar spine   . Hypercholesteremia   . Hyperlipidemia   . Hypertension    benign   . Thyroid cancer ([place]) 2001   Past Surgical History:  Procedure Laterality Date  . ABDOMINAL HYSTERECTOMY     . CARDIAC CATHETERIZATION    . CATARACT EXTRACTION W/PHACO Right 07/17/2012   Procedure: CATARACT EXTRACTION PHACO AND INTRAOCULAR LENS PLACEMENT (IOC);  Surgeon: Tonny Branch, MD;  Location: [facility] ORS;  Service: Ophthalmology;  Laterality: Right;  CDE:25.51  . CATARACT EXTRACTION W/PHACO Left 09/17/2016   Procedure: CATARACT EXTRACTION PHACO AND INTRAOCULAR LENS PLACEMENT LEFT EYE;  Surgeon: Tonny Branch, MD;  Location: [facility] ORS;  Service: Ophthalmology;  Laterality: Left;  CDE: 19.23  . COLONOSCOPY    . COLONOSCOPY N/A 01/15/2014   Procedure: COLONOSCOPY;  Surgeon: Daneil Dolin, MD;  Location: [facility] END[facility] SUITE;  Service: Endoscopy;  Laterality: N/A;  9:00 AM  . DOPPLER ECHOCARDIOGRAPHY    . JOINT REPLACEMENT  07/01/2010   left hip  . KNEE SURGERY Right  arthroscopy  . left hip replaced  07/01/2010  . SPINE SURGERY  2006   cervical  . stress dipyridamole myocardial perfusion    . THYROIDECTOMY    . TONSILLECTOMY    . VESICOVAGINAL FISTULA CLOSURE W/ TAH     Social History   Socioeconomic History  . Marital status: Married    Spouse name: Not on file  . Number of children: 2  . Years of education: 58  . Highest education level: Not on file  Occupational History  . Occupation: retired     Comment: Company secretary  . Financial resource strain: Not hard at all  . Food insecurity    Worry: Never true    Inability: Never true  . Transportation needs    Medical: No    Non-medical: No  Tobacco Use  . Smoking status: Passive Smoke Exposure - Never Smoker  . Smokeless tobacco: Never Used  . Tobacco comment: Husband smokes in the home  Substance and Sexual Activity  . Alcohol use: No  . Drug use: No  . Sexual activity: Never  Lifestyle  . Physical activity    Days per week: 5 days    Minutes per session: 40 min  . Stress: Not at all  Relationships  . Social connections    Talks on phone: More than three times a week    Gets together: Three times a week    Attends religious  service: More than 4 times per year    Active member of club or organization: Yes    Attends meetings of clubs or organizations: More than 4 times per year    Relationship status: Married  . Intimate partner violence    Fear of current or ex partner: No    Emotionally abused: No    Physically abused: No    Forced sexual activity: No  Other Topics Concern  . Not on file  Social History Narrative   Lives with husband, at home   No caffeine   Current Outpatient Medications  Medication Sig Dispense Refill  . aspirin EC 81 MG tablet Take 1 tablet (81 mg total) by mouth daily.    . ferrous sulfate 325 (65 FE) MG tablet Take 325 mg by mouth daily with breakfast.    . fluticasone (FLONASE) 50 MCG/ACT nasal spray Place 2 sprays into both nostrils daily. 16 g 6  . gabapentin (NEURONTIN) 100 MG capsule TAKE 1 CAPSULE(100 MG) BY MOUTH THREE TIMES DAILY 270 capsule 1  . glucose blood (ACCU-CHEK AVIVA PLUS) test strip Use as instructed four times daily dx E11.65 150 each 5  . imipramine (TOFRANIL) 25 MG tablet TAKE 4 TABLETS BY MOUTH EVERY NIGHT AT BEDTIME 120 tablet 5  . Insulin Glargine (TOUJEO SOLOSTAR [place]) Inject 30-50 Units into the skin 2 (two) times daily.    . isosorbide mononitrate (IMDUR) 30 MG 24 hr tablet TAKE 1/2 TABLET(15 MG) BY MOUTH DAILY 45 tablet 2  . Lancets (ACCU-CHEK MULTICLIX) lancets Use as instructed three times daily dx 250.01 100 each 5  . levothyroxine (SYNTHROID) 200 MCG tablet Take 1 tablet (200 mcg total) by mouth daily before breakfast. 30 tablet 5  . loratadine (CLARITIN) 10 MG tablet Take 10 mg by mouth daily as needed for allergies.     . Menthol, Topical Analgesic, (ICY HOT EX) Apply 1 application topically 3 (three) times daily as needed (for back pain.).    Marland Kitchen metFORMIN (GLUCOPHAGE) 1000 MG tablet TAKE 1 TABLET TWICE A  DAY 180 tablet 1  . metoprolol tartrate (LOPRESSOR) 50 MG tablet Take 1 tablet (50 mg total) by mouth 2 (two) times daily. 180 tablet 3  . Multiple  Vitamin (MULTIVITAMIN) tablet Take 1 tablet by mouth daily.    Marland Kitchen PARoxetine (PAXIL) 20 MG tablet Take 1 tablet (20 mg total) by mouth daily. Patient reports that she takes it 1-2 times week. 90 tablet 3  . pravastatin (PRAVACHOL) 80 MG tablet TAKE 1 TABLET DAILY 90 tablet 1  . quinapril (ACCUPRIL) 10 MG tablet Take 1 tablet (10 mg total) by mouth daily. 90 tablet 1  . Semaglutide,0.25 or 0.5MG /DOS, (OZEMPIC, 0.25 OR 0.5 MG/DOSE,) 2 MG/1.5ML SOPN Inject 0.5 mg into the skin once a week. 2 pen 2  . verapamil (CALAN-SR) 180 MG CR tablet TAKE 1 TABLET(180 MG) BY MOUTH AT BEDTIME 30 tablet 11  . vitamin B-12 (CYANOCOBALAMIN) 1000 MCG tablet Take 1 tablet (1,000 mcg total) by mouth daily. 30 tablet 5  . VITAMIN D, CHOLECALCIFEROL, PO Take 1 capsule by mouth daily.     No current facility-administered medications for this visit.      Allergies  Allergen Reactions  . Daypro [Oxaprozin] Hives  . Sulfonamide Derivatives Hives   Family History  Problem Relation Age of Onset  . Heart attack Father   . Heart failure Mother   . Asthma Daughter   . Sleep apnea Son        CPAP  . Colon cancer Neg Hx     PE: BP 110/60   Pulse 60   Ht 5' 4.5" (1.638 m) Comment: measured  Wt 187 lb (84.8 kg)   LMP 11/11/2016   BMI 31.60 kg/m  Wt Readings from Last 3 Encounters:  09/21/18 187 lb (84.8 kg)  08/02/18 184 lb (83.5 kg)  07/26/18 184 lb (83.5 kg)   Constitutional: overweight, in NAD Eyes: PERRLA, EOMI, no exophthalmos ENT: moist mucous membranes, no  neck masses palpated, no cervical lymphadenopathy Cardiovascular: RRR, No MRG Respiratory: CTA B Gastrointestinal: abdomen soft, NT, ND, BS+ Musculoskeletal: no deformities, strength intact in all 4 Skin: moist, warm, no rashes Neurological: no tremor with outstretched hands, DTR normal in all 4  ASSESSMENT: 1. DM2, insulin-dependent, uncontrolled, with long-term complications - CAD - DR  2.  Papillary thyroid cancer  3.  Postsurgical  hypothyroidism  PLAN:  1. Patient with longstanding, uncontrolled, type 2 diabetes, on metformin, long-acting insulin and previously on GLP-1 receptor agonist which we tried to increase at last visit, but unfortunately, she developed constipation and she had to stop.  Her sugars are still high, above goal whenever she checks.  We discussed about the cardiovascular benefits of Ozempic and she is willing to try the low-dose again, since she tolerated this well in the past.  Discussed about adding Metamucil and staying well-hydrated while on this.  I advised her to let me know if sugars do not improve, in which case, we may need to add glipizide versus short-acting insulin. - I suggested to:  Patient Instructions  Please continue: - Metformin 1000 mg 2x a day with meals. - Toujeo 30 units in a.m. and 50 units at bedtime  Restart: - Ozempic 0.25 mg weekly in a.m. x 2 doses, then 0.5 mg weekly  Please continue levothyroxine 200 mcg daily.  Take the thyroid hormone every day, with water, at least 30 minutes before breakfast, separated by at least 4 hours from: - acid reflux medications - calcium - iron - multivitamins  Please stop  at the lab.  Please return in 3 months with your sugar log.   - today, HbA1c is 8.9% (higher) - continue checking sugars at different times of the day - check 2x a day, rotating checks - advised for yearly eye exams >> she is UTD - Return to clinic in 3 mo with sugar log     2.  Papillary thyroid cancer -Reviewed previous thyroid cancer history per records from Dr. Ronnald Collum -Her thyroid cancer appears to be metastatic, as patient is status post 4 RAI tx's.  She also had increased signal on the PET scan from 2009, however, in 2010, another PET scan showed possible inflammatory lymph nodes in the area. -At last visit, her thyroglobulin level was higher, at 0.7, which could have been due to the change in assay, however, it is still detectable, so we obtained a neck  ultrasound.  This showed a nonspecific calcified lesion in the right upper neck.  A CT scan was recommended.  I advised the patient to first obtain a whole-body scan and then proceed with a CT scan.  She did not have the whole body scan yet.  -I will also repeat her thyroglobulin and ATA today -After the results return, we will probably need to order the whole-body scan versus go directly with the CT scan  3.  Postsurgical hypothyroidism - latest thyroid labs reviewed with pt >> normal at last visit - she continues on LT4 200 mcg daily - pt feels good on this dose. - we discussed about taking the thyroid hormone every day, with water, >30 minutes before breakfast, separated by >4 hours from acid reflux medications, calcium, iron, multivitamins. Pt. is taking it correctly. - will check thyroid tests today: TSH and fT4 - If labs are abnormal, she will need to return for repeat TFTs in 1.5 months  Component     Latest Ref Rng & Units 09/21/2018  Thyroglobulin     ng/mL 0.3 (L)  Comment        Hemoglobin A1C     4.0 - 5.6 % 8.9 (A)  TSH     0.35 - 4.50 uIU/mL 0.06 (L)  T4,Free(Direct)     0.60 - 1.60 ng/dL 1.26  Thyroglobulin Ab     < or = 1 IU/mL <1  Thyroglobulin has improved.  It is now at her baseline.  I will order a neck CT scan. TSH is suppressed.  We need to back off the levothyroxine to 175 mcg daily.  I will have her back for recheck in 1.5 months.  CT (10/18/2018): CLINICAL DATA:  Neck mass, solitary, afebrile. Additional history: Patient with history of papillary thyroid cancer status post thyroidectomy and radiation. Recent ultrasound showing nonspecific calcified lesion in right neck. Question mild elevation in thyroglobulin.  EXAM: CT NECK WITH CONTRAST  TECHNIQUE: Multidetector CT imaging of the neck was performed using the standard protocol following the bolus administration of intravenous contrast.  CONTRAST:  34mL ISOVUE-300 IOPAMIDOL (ISOVUE-300) INJECTION  61%  COMPARISON:  Thyroid ultrasound 06/13/2018, report were neck CT 10/24/2007  FINDINGS: Pharynx and larynx: Normal. No mass or swelling.  Salivary glands: Atrophy of bilateral parotid glands. The submandibular glands are unremarkable.  Thyroid: Status post thyroidectomy with multiple surgical clips in the thyroid bed. No soft tissue mass within the thyroidectomy bed. Within the right aspect of the thyroidectomy bed, there is a nonspecific ovoid focus of calcification measuring 13 x 7 mm (series 7, image 41). This likely corresponds with the finding on  thyroid ultrasound 06/13/2018 and is unchanged as compared to neck CT 10/24/2007.  Lymph nodes: No pathologically enlarged cervical chain lymph nodes. There is a nonenlarged calcified right supraclavicular lymph node measuring 6 mm in short axis (series 5, image 85), new from prior neck CT 10/24/2007.  Vascular: Scattered atherosclerotic calcification within the imaged aortic arch and major branch vessels. This includes calcified plaque at the bilateral carotid bifurcations. Partially retropharyngeal course of the bilateral internal carotid arteries.  Limited intracranial: Unremarkable  Visualized orbits: Unremarkable  Mastoids and visualized paranasal sinuses: No significant paranasal sinus disease or mastoid effusion.  Skeleton: Sequela of prior C5-C7 ACDF. Cervical spondylosis with multilevel posterior disc osteophyte complexes and ossification of the posterior longitudinal ligament.  Upper chest: The imaged lung apices are clear.  IMPRESSION: Status post thyroidectomy. No soft tissue mass within the thyroidectomy bed. A 13 x 7 mm ovoid focus of calcification in the right aspect of the thyroidectomy bed likely corresponding with the finding on recent neck ultrasound and is unchanged as compared to neck CT 10/24/2007, benign.  No pathologically enlarged cervical chain lymph nodes. A  nonenlarged calcified right supraclavicular lymph node is new from prior neck CT 10/24/2007 but also favored benign. Attention recommended on follow-up.  Philemon Kingdom, MD PhD [facility] Endocr[facility]

## 2018-09-25 LAB — THYROGLOBULIN ANTIBODY: Thyroglobulin Ab: <1 IU/mL (ref <=1)

## 2018-09-25 LAB — THYROGLOBULIN LEVEL: Thyroglobulin: 0.3 ng/mL — ABNORMAL LOW

## 2018-09-25 MED ORDER — LEVOTHYROXINE SODIUM 175 MCG PO TABS: 175.0000 ug | ORAL_TABLET | Freq: Every day | ORAL | 3 refills | Status: DC

## 2018-09-26 ENCOUNTER — Telehealth: Payer: Self-pay

## 2018-09-26 NOTE — Telephone Encounter (Signed)
-----   Message from Philemon Kingdom, MD sent at 09/25/2018  5:24 PM EDT ----- Lenna Sciara, can you please call pt:  Thyroglobulin has improved.  It is now at her baseline.  I will order a neck CT scan to investigate her upper neck mass -we discussed at last visit.  TSH is now too low.  We need to back off the levothyroxine to 175 mcg daily.  I will have her back for recheck in 1.5 months.  Labs are in. Rx sent.

## 2018-09-27 ENCOUNTER — Encounter: Payer: Self-pay | Admitting: *Deleted

## 2018-09-27 ENCOUNTER — Telehealth: Payer: Self-pay | Admitting: *Deleted

## 2018-09-27 NOTE — Telephone Encounter (Signed)
signed

## 2018-09-27 NOTE — Telephone Encounter (Signed)
Notified patient of message from Dr. Cruzita Lederer, patient expressed understanding and agreement.  Patient did let me know before seeing [place] she was on Synthroid because her doctor felt it worked better. When labs return after new dose if she still isn't feeling better she would like [place] to send brand name and try PA.

## 2018-09-27 NOTE — Telephone Encounter (Signed)
[facility] called they received paperwork for diabetic shoes but Dr. Moshe Cipro did not sign office visit note and statement of physician services. She is faxing back to [place] and this can be refaxed to the [place] office as this is where she is working.

## 2018-09-28 DIAGNOSIS — Z961 Presence of intraocular lens: Secondary | ICD-10-CM | POA: Diagnosis not present

## 2018-09-28 DIAGNOSIS — E119 Type 2 diabetes mellitus without complications: Secondary | ICD-10-CM | POA: Diagnosis not present

## 2018-09-28 DIAGNOSIS — H26491 Other secondary cataract, right eye: Secondary | ICD-10-CM | POA: Diagnosis not present

## 2018-10-18 ENCOUNTER — Ambulatory Visit
Admission: RE | Admit: 2018-10-18 | Discharge: 2018-10-18 | Disposition: A | Discharge status: Home / Self Care | Payer: Medicare Other | Source: Ambulatory Visit | Attending: Internal Medicine | Admitting: Internal Medicine

## 2018-10-18 DIAGNOSIS — Z9089 Acquired absence of other organs: Secondary | ICD-10-CM | POA: Diagnosis not present

## 2018-10-18 DIAGNOSIS — R221 Localized swelling, mass and lump, neck: Secondary | ICD-10-CM | POA: Diagnosis not present

## 2018-10-18 MED ORDER — IOPAMIDOL (ISOVUE-300) INJECTION 61%
75.0000 mL | Freq: Once | INTRAVENOUS | Status: AC | PRN
  Administered 2018-10-18: 75 mL via INTRAVENOUS

## 2018-10-19 ENCOUNTER — Telehealth: Payer: Self-pay | Admitting: Family Medicine

## 2018-10-19 ENCOUNTER — Other Ambulatory Visit: Payer: Self-pay

## 2018-10-19 DIAGNOSIS — I10 Essential (primary) hypertension: Secondary | ICD-10-CM

## 2018-10-19 MED ORDER — QUINAPRIL HCL 10 MG PO TABS: 10.0000 mg | ORAL_TABLET | Freq: Every day | ORAL | 1 refills | Status: DC

## 2018-10-19 NOTE — Telephone Encounter (Signed)
Pt left VM that she needs a new RX on quinapril (ACCUPRIL) 10 MG tablet.

## 2018-10-19 NOTE — Telephone Encounter (Signed)
Prescription refilled.

## 2018-10-24 ENCOUNTER — Encounter: Payer: Self-pay | Admitting: Family Medicine

## 2018-10-24 ENCOUNTER — Encounter (INDEPENDENT_AMBULATORY_CARE_PROVIDER_SITE_OTHER): Payer: Self-pay

## 2018-10-24 ENCOUNTER — Ambulatory Visit (INDEPENDENT_AMBULATORY_CARE_PROVIDER_SITE_OTHER): Payer: Medicare Other | Admitting: Family Medicine

## 2018-10-24 ENCOUNTER — Other Ambulatory Visit: Payer: Self-pay

## 2018-10-24 VITALS — BP 112/70 | HR 85 | Temp 97.7°F | Resp 15 | Ht 64.5 in | Wt 185.0 lb

## 2018-10-24 DIAGNOSIS — I2510 Atherosclerotic heart disease of native coronary artery without angina pectoris: Secondary | ICD-10-CM

## 2018-10-24 DIAGNOSIS — I2583 Coronary atherosclerosis due to lipid rich plaque: Secondary | ICD-10-CM

## 2018-10-24 DIAGNOSIS — F3289 Other specified depressive episodes: Secondary | ICD-10-CM

## 2018-10-24 DIAGNOSIS — E669 Obesity, unspecified: Secondary | ICD-10-CM

## 2018-10-24 DIAGNOSIS — I10 Essential (primary) hypertension: Secondary | ICD-10-CM | POA: Diagnosis not present

## 2018-10-24 DIAGNOSIS — K219 Gastro-esophageal reflux disease without esophagitis: Secondary | ICD-10-CM | POA: Diagnosis not present

## 2018-10-24 DIAGNOSIS — R0989 Other specified symptoms and signs involving the circulatory and respiratory systems: Secondary | ICD-10-CM | POA: Diagnosis not present

## 2018-10-24 DIAGNOSIS — E1159 Type 2 diabetes mellitus with other circulatory complications: Secondary | ICD-10-CM

## 2018-10-24 DIAGNOSIS — E785 Hyperlipidemia, unspecified: Secondary | ICD-10-CM | POA: Diagnosis not present

## 2018-10-24 MED ORDER — PANTOPRAZOLE SODIUM 40 MG PO TBEC: 40.0000 mg | DELAYED_RELEASE_TABLET | Freq: Every day | ORAL | 1 refills | Status: DC

## 2018-10-24 NOTE — Patient Instructions (Addendum)
Keep Sept appt , call if you need me sooner  New for reflx is once daily Protonix, and you are referred to Dr Sydell Axon because of food sticking  You are referred for [place] of neck arteries to evaluate blockage  Phazyme will be sent for belching also  Thanks for choosing [facility], we consider it a privelige to serve you.   It is important that you exercise regularly at least 30 minutes 5 times a week. If you develop chest pain, have severe difficulty breathing, or feel very tired, stop exercising immediately and seek medical attention

## 2018-10-25 ENCOUNTER — Encounter: Payer: Self-pay | Admitting: Internal Medicine

## 2018-10-30 ENCOUNTER — Other Ambulatory Visit: Payer: Self-pay | Admitting: Family Medicine

## 2018-10-30 DIAGNOSIS — I10 Essential (primary) hypertension: Secondary | ICD-10-CM

## 2018-11-05 ENCOUNTER — Encounter: Payer: Self-pay | Admitting: Family Medicine

## 2018-11-05 DIAGNOSIS — E669 Obesity, unspecified: Secondary | ICD-10-CM | POA: Insufficient documentation

## 2018-11-05 DIAGNOSIS — E663 Overweight: Secondary | ICD-10-CM | POA: Insufficient documentation

## 2018-11-05 MED ORDER — SIMETHICONE 125 MG PO CHEW: 125.0000 mg | CHEWABLE_TABLET | Freq: Four times a day (QID) | ORAL | 0 refills | Status: DC | PRN

## 2018-11-05 NOTE — Assessment & Plan Note (Signed)
Controlled, no change in medication DASH diet and commitment to daily physical activity for a minimum of 30 minutes discussed and encouraged, as a part of hypertension management. The importance of attaining a healthy weight is also discussed.  BP/Weight 10/24/2018 09/21/2018 08/02/2018 07/26/2018 07/10/2018 05/26/2018 0/37/9444  Systolic BP 619 012 224 114 643 142 767  Diastolic BP 70 60 70 70 70 70 74  Wt. (Lbs) 185 187 184 184 180 190 193.6  BMI 31.26 31.6 30.62 30.62 29.95 31.62 32.22

## 2018-11-05 NOTE — Assessment & Plan Note (Signed)
Hyperlipidemia:Low fat diet discussed and encouraged.   Lipid Panel  Lab Results  Component Value Date   CHOL 167 01/18/2018   HDL 45 (L) 01/18/2018   LDLCALC 100 (H) 01/18/2018   TRIG 127 01/18/2018   CHOLHDL 3.7 01/18/2018   Needs to increase exercise and reduce fat intake slightly

## 2018-11-05 NOTE — Assessment & Plan Note (Signed)
Require imaging, carotid doppler ordered

## 2018-11-05 NOTE — Progress Notes (Signed)
IVER FEHRENBACH     MRN: [id number]      DOB: 01-Jan-1946   HPI [name] is here for follow up and re-evaluation of chronic medical conditions, medication management and review of any available recent lab and radiology data.  Wishes to review recent data presented by  Endo, specifically calcifications noted in her carotids on recent scan, which need additional imaging She also c/o increased gI symptoms of uncontrolled GERD, and belching x 3 weeks, as well as solid dysphagia, needs GI eval Also noted is a more prominent lymph node which requires additional follow up, she has a whole body scan ordered by Endo, she has a past h/o parathyroid cancer   ROS Denies recent fever or chills. Denies sinus pressure, nasal congestion, ear pain or sore throat. Denies chest congestion, productive cough or wheezing. Denies chest pains, palpitations and leg swelling .   Denies dysuria, frequency, hesitancy or incontinence. Denies uncontrolled joint pain, swelling and limitation in mobility. Denies headaches, seizures, numbness, or tingling. Denies uncontrolled depression, anxiety or insomnia. Denies skin break down or rash.   PE  BP 112/70   Pulse 85   Temp 97.7 F (36.5 C) (Temporal)   Resp 15   Ht 5' 4.5" (1.638 m)   Wt 185 lb (83.9 kg)   LMP 11/11/2016   SpO2 99%   BMI 31.26 kg/m   Patient alert and oriented and in no cardiopulmonary distress.  HEENT: No facial asymmetry, EOMI,   oropharynx pink and moist.  Neck supple no JVD, no mass.Bruit   Chest: Clear to auscultation bilaterally.  CVS: S1, S2 no murmurs, no S3.Regular rate.  ABD: Soft non tender.   Ext: No edema  MS: Adequate ROM spine, shoulders, hips and knees.  Skin: Intact, no ulcerations or rash noted.  Psych: Good eye contact, normal affect. Memory intact not anxious or depressed appearing.  CNS: CN 2-12 intact, power,  normal throughout.no focal deficits noted.   Assessment & Plan  Bilateral carotid bruits  Require imaging, carotid doppler ordered  Hypertension Controlled, no change in medication DASH diet and commitment to daily physical activity for a minimum of 30 minutes discussed and encouraged, as a part of hypertension management. The importance of attaining a healthy weight is also discussed.  BP/Weight 10/24/2018 09/21/2018 08/02/2018 07/26/2018 07/10/2018 05/26/2018 4/62/7035  Systolic BP 009 381 829 937 169 678 938  Diastolic BP 70 60 70 70 70 70 74  Wt. (Lbs) 185 187 184 184 180 190 193.6  BMI 31.26 31.6 30.62 30.62 29.95 31.62 32.22       Type 2 diabetes mellitus with vascular disease ([place]) Followed by Endo Ms. Weinrich is reminded of the importance of commitment to daily physical activity for 30 minutes or more, as able and the need to limit carbohydrate intake to 30 to 60 grams per meal to help with blood sugar control.   The need to take medication as prescribed, test blood sugar as directed, and to call between visits if there is a concern that blood sugar is uncontrolled is also discussed.   Ms. Coudriet is reminded of the importance of daily foot exam, annual eye examination, and good blood sugar, blood pressure and cholesterol control. Not at goal  Diabetic Labs Latest Ref Rng & Units 09/21/2018 05/26/2018 02/07/2018 01/18/2018 08/06/2017  HbA1c 4.0 - 5.6 % 8.9(A) 8.4(A) 8.0(A) - 8.9  Microalbumin Not Estab. ug/mL - - - - -  Micro/Creat Ratio 0.0 - 30.0 mg/g creat - - - - -  Chol <200 mg/dL - - - 167 -  HDL >50 mg/dL - - - 45(L) -  Calc LDL mg/dL (calc) - - - 100(H) -  Triglycerides <150 mg/dL - - - 127 -  Creatinine 0.60 - 0.93 mg/dL - - - 0.79 -   BP/Weight 10/24/2018 09/21/2018 08/02/2018 07/26/2018 07/10/2018 05/26/2018 2/95/6213  Systolic BP 086 578 469 629 528 413 244  Diastolic BP 70 60 70 70 70 70 74  Wt. (Lbs) 185 187 184 184 180 190 193.6  BMI 31.26 31.6 30.62 30.62 29.95 31.62 32.22   Foot/eye exam completion dates Latest Ref Rng & Units 05/24/2018 01/10/2018  Eye Exam No  Retinopathy No Retinopathy -  Foot exam Order - - -  Foot Form Completion - - Done        GERD (gastroesophageal reflux disease) 3 week h/o increased and uncontrolled symptoms as wellas excess belching refer GI, resume PPI  Depression Controlled, no change in medication   Dyslipidemia Hyperlipidemia:Low fat diet discussed and encouraged.   Lipid Panel  Lab Results  Component Value Date   CHOL 167 01/18/2018   HDL 45 (L) 01/18/2018   LDLCALC 100 (H) 01/18/2018   TRIG 127 01/18/2018   CHOLHDL 3.7 01/18/2018   Needs to increase exercise and reduce fat intake slightly    Obesity (BMI 30.0-34.9)  Patient re-educated about  the importance of commitment to a  minimum of 150 minutes of exercise per week as able.  The importance of healthy food choices with portion control discussed, as well as eating regularly and within a 12 hour window most days. The need to choose "clean , green" food 50 to 75% of the time is discussed, as well as to make water the primary drink and set a goal of 64 ounces water daily.    Weight /BMI 10/24/2018 09/21/2018 08/02/2018  WEIGHT 185 lb 187 lb 184 lb  HEIGHT 5' 4.5" 5' 4.5" 5\' 5"   BMI 31.26 kg/m2 31.6 kg/m2 30.62 kg/m2

## 2018-11-05 NOTE — Assessment & Plan Note (Signed)
  Patient re-educated about  the importance of commitment to a  minimum of 150 minutes of exercise per week as able.  The importance of healthy food choices with portion control discussed, as well as eating regularly and within a 12 hour window most days. The need to choose "clean , green" food 50 to 75% of the time is discussed, as well as to make water the primary drink and set a goal of 64 ounces water daily.    Weight /BMI 10/24/2018 09/21/2018 08/02/2018  WEIGHT 185 lb 187 lb 184 lb  HEIGHT 5' 4.5" 5' 4.5" 5\' 5"   BMI 31.26 kg/m2 31.6 kg/m2 30.62 kg/m2

## 2018-11-05 NOTE — Assessment & Plan Note (Signed)
3 week h/o increased and uncontrolled symptoms as wellas excess belching refer GI, resume PPI

## 2018-11-05 NOTE — Assessment & Plan Note (Signed)
Followed by [name] is reminded of the importance of commitment to daily physical activity for 30 minutes or more, as able and the need to limit carbohydrate intake to 30 to 60 grams per meal to help with blood sugar control.   The need to take medication as prescribed, test blood sugar as directed, and to call between visits if there is a concern that blood sugar is uncontrolled is also discussed.   Jennifer Blackburn is reminded of the importance of daily foot exam, annual eye examination, and good blood sugar, blood pressure and cholesterol control. Not at goal  Diabetic Labs Latest Ref Rng & Units 09/21/2018 05/26/2018 02/07/2018 01/18/2018 08/06/2017  HbA1c 4.0 - 5.6 % 8.9(A) 8.4(A) 8.0(A) - 8.9  Microalbumin Not Estab. ug/mL - - - - -  Micro/Creat Ratio 0.0 - 30.0 mg/g creat - - - - -  Chol <200 mg/dL - - - 167 -  HDL >50 mg/dL - - - 45(L) -  Calc LDL mg/dL (calc) - - - 100(H) -  Triglycerides <150 mg/dL - - - 127 -  Creatinine 0.60 - 0.93 mg/dL - - - 0.79 -   BP/Weight 10/24/2018 09/21/2018 08/02/2018 07/26/2018 07/10/2018 05/26/2018 1/85/6314  Systolic BP 970 263 785 885 027 741 287  Diastolic BP 70 60 70 70 70 70 74  Wt. (Lbs) 185 187 184 184 180 190 193.6  BMI 31.26 31.6 30.62 30.62 29.95 31.62 32.22   Foot/eye exam completion dates Latest Ref Rng & Units 05/24/2018 01/10/2018  Eye Exam No Retinopathy No Retinopathy -  Foot exam Order - - -  Foot Form Completion - - Done

## 2018-11-05 NOTE — Assessment & Plan Note (Signed)
Controlled, no change in medication  

## 2018-11-07 NOTE — Progress Notes (Signed)
Referring Provider:  Fayrene Helper, MD Primary Care Physician:  Fayrene Helper, MD Primary Gastroenterologist:  Dr. Gala Romney  Chief Complaint  Patient presents with  . Gastroesophageal Reflux    "loud belching" and burning in chest. Last episode last week. Feels acid come up in the night, throat feels clogged in the mornings    HPI:   Jennifer Blackburn is a 73 y.o. female presenting today at the request of Dr. Tula Nakayama for GERD. Patient has not been seen in our office in several years. She was last seen by Dr. Gala Romney at the time of her colonoscopy in 2005. This procedure was triaged via telephone encounter.   Last colonoscopy 01/15/14: One 8 mm polyp removed. Redundant colon. Pancolonic diverticulosis. Pathology with tubular adenoma. Recommended repeat in 5 years. (2020)  EGD in 2005 with normal esophagus, normal stomach, and normal first and second portions of duodenum.   Patient was recently saw her PCP on 10/24/18 and had uncontrolled GERD symptoms and reported solid food dysphagia. Patient was started on Protonix 40 mg daily.   Today she states she has been having reflux at night waking up feeling burning and acid in her throat for about the last year. Will wake up in the morning and have to cough to clear her throat. Will cough up green looking mucous. Afterwords without coughing. Saw ENT last year who told her she had GERD. This summer started eating a lot of tomato's, cucumbers, onions, and vinegar. This resulted in daily reflux symptoms and belching. She was taking a lot of tums at this time. Stopped eating all the garden vegetables with vinegar about 2-3 weeks ago and the daily reflux symptoms have resolved. Continued with the reflux at night. She is taking Protonix in the morning. No epigastric pain. No nausea or vomiting.  Occasional food dysphagia. Typically with breads or peanut butter. No regular trouble with meats as she is very careful to ensure she is chewing her  foods very well. This has been present for a few months at this point.   Trouble with bowels since restarting Ozempic. She had stopped Ozempic in March as it was causing her to have constipation. She was restarted on a lower dose in July and has been having postprandial diarrhea with urgency. Diarrhea daily to every other day. With diarrhea, will have 2-3 BMs a day. Associated with abdominal cramping that is resolved after a BM. No nightly BMs. No recent antibiotics or hospitalizations. No sick contacts. Bowels were soft and formed off of Ozempic. No blood in the stool. No melena. No unintentional weight loss. Good appetite.   Denies fever, chills, lightheaded, dizziness, or pre-syncope. Denies chest pain, heart palpitations, shortness of breath.   Past Medical History:  Diagnosis Date  . Anxiety   . CAD (coronary artery disease)   . Depression   . Diabetes mellitus type II    without complication  . DJD (degenerative joint disease) of lumbar spine   . Hypercholesteremia   . Hyperlipidemia   . Hypertension    benign   . Thyroid cancer ([place]) 2001    Past Surgical History:  Procedure Laterality Date  . ABDOMINAL HYSTERECTOMY    . CARDIAC CATHETERIZATION    . CATARACT EXTRACTION W/PHACO Right 07/17/2012   Procedure: CATARACT EXTRACTION PHACO AND INTRAOCULAR LENS PLACEMENT (IOC);  Surgeon: Tonny Branch, MD;  Location: [facility];  Service: Ophthalmology;  Laterality: Right;  CDE:25.51  . CATARACT EXTRACTION W/PHACO Left 09/17/2016   Procedure: CATARACT  EXTRACTION PHACO AND INTRAOCULAR LENS PLACEMENT LEFT EYE;  Surgeon: Tonny Branch, MD;  Location: [facility];  Service: Ophthalmology;  Laterality: Left;  CDE: 19.23  . COLONOSCOPY    . COLONOSCOPY N/A 01/15/2014   Procedure: COLONOSCOPY;  Surgeon: Daneil Dolin, MD;  Location: [facility] SUITE;  Service: Endoscopy;  Laterality: N/A;  9:00 AM  . DOPPLER ECHOCARDIOGRAPHY    . JOINT REPLACEMENT  07/01/2010   left hip  . KNEE SURGERY Right    arthroscopy   . left hip replaced  07/01/2010  . SPINE SURGERY  2006   cervical  . stress dipyridamole myocardial perfusion    . THYROIDECTOMY    . TONSILLECTOMY    . VESICOVAGINAL FISTULA CLOSURE W/ TAH      Current Outpatient Medications  Medication Sig Dispense Refill  . aspirin EC 81 MG tablet Take 1 tablet (81 mg total) by mouth daily.    . ferrous sulfate 325 (65 FE) MG tablet Take 325 mg by mouth daily with breakfast.    . fluticasone (FLONASE) 50 MCG/ACT nasal spray Place 2 sprays into both nostrils daily. 16 g 6  . gabapentin (NEURONTIN) 100 MG capsule TAKE 1 CAPSULE(100 MG) BY MOUTH THREE TIMES DAILY 270 capsule 1  . glucose blood (ACCU-CHEK AVIVA PLUS) test strip Use as instructed four times daily dx E11.65 150 each 5  . imipramine (TOFRANIL) 25 MG tablet TAKE 4 TABLETS BY MOUTH EVERY NIGHT AT BEDTIME 120 tablet 5  . Insulin Glargine (TOUJEO SOLOSTAR [place]) Inject 30-50 Units into the skin 2 (two) times daily.    . isosorbide mononitrate (IMDUR) 30 MG 24 hr tablet TAKE 1/2 TABLET(15 MG) BY MOUTH DAILY 45 tablet 2  . Lancets (ACCU-CHEK MULTICLIX) lancets Use as instructed three times daily dx 250.01 100 each 5  . levothyroxine (SYNTHROID) 175 MCG tablet Take 1 tablet (175 mcg total) by mouth daily before breakfast. 45 tablet 3  . loratadine (CLARITIN) 10 MG tablet Take 10 mg by mouth daily as needed for allergies.     . Menthol, Topical Analgesic, (ICY HOT EX) Apply 1 application topically 3 (three) times daily as needed (for back pain.).    Marland Kitchen metFORMIN (GLUCOPHAGE) 1000 MG tablet TAKE 1 TABLET TWICE A DAY 180 tablet 1  . metoprolol tartrate (LOPRESSOR) 50 MG tablet Take 1 tablet (50 mg total) by mouth 2 (two) times daily. 180 tablet 3  . Multiple Vitamin (MULTIVITAMIN) tablet Take 1 tablet by mouth daily.    . pantoprazole (PROTONIX) 40 MG tablet Take 1 tablet (40 mg total) by mouth daily. 30 tablet 1  . PARoxetine (PAXIL) 20 MG tablet Take 1 tablet (20 mg total) by mouth daily. Patient reports  that she takes it 1-2 times week. 90 tablet 3  . pravastatin (PRAVACHOL) 80 MG tablet TAKE 1 TABLET DAILY 90 tablet 1  . quinapril (ACCUPRIL) 10 MG tablet TAKE 1 TABLET DAILY 90 tablet 1  . Semaglutide,0.25 or 0.5MG /DOS, (OZEMPIC, 0.25 OR 0.5 MG/DOSE,) 2 MG/1.5ML SOPN Inject 0.5 mg into the skin once a week. 2 pen 2  . simethicone (PHAZYME) 125 MG chewable tablet Chew 1 tablet (125 mg total) by mouth every 6 (six) hours as needed for flatulence. 30 tablet 0  . verapamil (CALAN-SR) 180 MG CR tablet TAKE 1 TABLET(180 MG) BY MOUTH AT BEDTIME 30 tablet 11  . vitamin B-12 (CYANOCOBALAMIN) 1000 MCG tablet Take 1 tablet (1,000 mcg total) by mouth daily. 30 tablet 5  . VITAMIN D, CHOLECALCIFEROL, PO Take  1 capsule by mouth daily.     No current facility-administered medications for this visit.     Allergies as of 11/08/2018 - Review Complete 11/08/2018  Allergen Reaction Noted  . Daypro [oxaprozin] Hives 08/23/2016  . Sulfonamide derivatives Hives 04/14/2007    Family History  Problem Relation Age of Onset  . Heart attack Father   . Heart failure Mother   . Asthma Daughter   . Sleep apnea Son        CPAP  . Colon cancer Neg Hx     Social History   Socioeconomic History  . Marital status: Married    Spouse name: Not on file  . Number of children: 2  . Years of education: 29  . Highest education level: Not on file  Occupational History  . Occupation: retired     Comment: Company secretary  . Financial resource strain: Not hard at all  . Food insecurity    Worry: Never true    Inability: Never true  . Transportation needs    Medical: No    Non-medical: No  Tobacco Use  . Smoking status: Passive Smoke Exposure - Never Smoker  . Smokeless tobacco: Never Used  . Tobacco comment: Husband smokes in the home  Substance and Sexual Activity  . Alcohol use: No  . Drug use: No  . Sexual activity: Never  Lifestyle  . Physical activity    Days per week: 5 days    Minutes per  session: 40 min  . Stress: Not at all  Relationships  . Social connections    Talks on phone: More than three times a week    Gets together: Three times a week    Attends religious service: More than 4 times per year    Active member of club or organization: Yes    Attends meetings of clubs or organizations: More than 4 times per year    Relationship status: Married  . Intimate partner violence    Fear of current or ex partner: No    Emotionally abused: No    Physically abused: No    Forced sexual activity: No  Other Topics Concern  . Not on file  Social History Narrative   Lives with husband, at home   No caffeine    Review of Systems: Gen: See HPI CV: Denies peripheral edema.  Resp: See HPI  GI: See HPI GU : Denies urinary burning, urinary frequency, urinary hesitancy MS: Denies joint pain, muscle weakness Derm: Denies rash, itching, dry skin Psych: Denies worsening depression, anxiety. Fairly well controlled with medication.  Heme: Denies bruising, bleeding  Physical Exam: BP 121/76   Pulse 76   Temp (!) 96.9 F (36.1 C) (Oral)   Ht 5' (1.524 m)   Wt 184 lb (83.5 kg)   LMP 11/11/2016   BMI 35.94 kg/m  General:   Alert and oriented. Pleasant and cooperative. Well-nourished and well-developed.  Head:  Normocephalic and atraumatic. Eyes:  Without icterus, sclera clear and conjunctiva pink.  Ears:  Normal auditory acuity. Nose:  No deformity, discharge,  or lesions. Lungs:  Clear to auscultation bilaterally. No wheezes, rales, or rhonchi. No distress.  Heart:  S1, S2 present without murmurs appreciated.  Abdomen:  +BS, soft, non-tender and non-distended. No HSM noted. No guarding or rebound. No masses appreciated.  Rectal:  Deferred  Msk:  Symmetrical without gross deformities. Normal posture. Extremities:  Without edema. Neurologic:  Alert and  oriented x4;  grossly normal  neurologically. Skin:  Intact without significant lesions or rashes. Psych: Normal mood  and affect.

## 2018-11-08 ENCOUNTER — Telehealth: Payer: Self-pay | Admitting: *Deleted

## 2018-11-08 ENCOUNTER — Other Ambulatory Visit: Payer: Self-pay

## 2018-11-08 ENCOUNTER — Ambulatory Visit (INDEPENDENT_AMBULATORY_CARE_PROVIDER_SITE_OTHER): Payer: Medicare Other | Admitting: Gastroenterology

## 2018-11-08 ENCOUNTER — Encounter: Payer: Self-pay | Admitting: Gastroenterology

## 2018-11-08 VITALS — BP 121/76 | HR 76 | Temp 96.9°F | Ht 60.0 in | Wt 184.0 lb

## 2018-11-08 DIAGNOSIS — R109 Unspecified abdominal pain: Secondary | ICD-10-CM

## 2018-11-08 DIAGNOSIS — R197 Diarrhea, unspecified: Secondary | ICD-10-CM

## 2018-11-08 DIAGNOSIS — K219 Gastro-esophageal reflux disease without esophagitis: Secondary | ICD-10-CM

## 2018-11-08 DIAGNOSIS — Z86010 Personal history of colonic polyps: Secondary | ICD-10-CM | POA: Diagnosis not present

## 2018-11-08 DIAGNOSIS — R1310 Dysphagia, unspecified: Secondary | ICD-10-CM | POA: Diagnosis not present

## 2018-11-08 MED ORDER — DICYCLOMINE HCL 10 MG PO CAPS: 10.0000 mg | ORAL_CAPSULE | Freq: Three times a day (TID) | ORAL | 1 refills | Status: DC

## 2018-11-08 NOTE — Telephone Encounter (Signed)
Pt called and said Dr. Moshe Cipro had ordered her an ultrasound on her neck but no one had ever called her to set this up and she was just checking on it.

## 2018-11-08 NOTE — Patient Instructions (Addendum)
Start taking Protonix 40 mg 30 minutes before dinner.   We will get you scheduled for upper endoscopy with possible dilation as well as colonoscopy in the near future.   Bentyl 10 mg as needed for diarrhea and abdominal cramping. You can use this 3-4 times daily.   We will see you back after your procedures. Call if concerns prior.   Aliene Altes, PA-C [facility]

## 2018-11-08 NOTE — Telephone Encounter (Signed)
Called to give patient ultrasound appt info. No answer. Left generic message requesting call back. Appointment is Monday Aug 24th at 2:30 at [facility] needs to be there by 2:15 to register.

## 2018-11-09 NOTE — Telephone Encounter (Signed)
Left appointment info on cell phone number. Encouraged patient to call with any questions or concerns.

## 2018-11-09 NOTE — Telephone Encounter (Signed)
Pt is aware of the appt  And the time

## 2018-11-11 DIAGNOSIS — R1310 Dysphagia, unspecified: Secondary | ICD-10-CM | POA: Insufficient documentation

## 2018-11-11 DIAGNOSIS — Z86010 Personal history of colonic polyps: Secondary | ICD-10-CM | POA: Insufficient documentation

## 2018-11-11 HISTORY — DX: Dysphagia, unspecified: R13.10

## 2018-11-11 NOTE — Assessment & Plan Note (Signed)
73 y.o. female with history of colon polyps. Last TCS in October 2015 with one 8 mm polyp, redundant colon, and pancolonic diverticulosis. Pathology with tubular adenoma. Due for repeat at this time. She has postprandial diarrhea with abdominal cramping that is relieved by a BM since restarting Ozempic in July. No other lower GI symptoms. No alarm symptoms. No family history of colon cancer.   Proceed with TCS with propofol with Dr. Gala Romney in the near future. The risks, benefits, and alternatives have been discussed in detail with patient. They have stated understanding and desire to proceed.   Follow-up after procedure.

## 2018-11-11 NOTE — Assessment & Plan Note (Addendum)
73 y.o. female with past medical history significant for CAD, T2DM, HTN, HLD, Thyroid cancer s/p total thyroidectomy, anxiety, and depression who presents with 1 year of nightly reflux symptoms, waking up with burning in her throat, and coughing/clearing her throat in the morning. She did have increase in daily reflux symptoms about 3 weeks ago due to eating fresh vegetables with vinegar. Daily reflux has resolved since decreasing the vegetables with vinegar. She was started on Protonix 40 mg daily about 2 weeks ago. Continues with night time reflux. Also with a few months of solid food dysphagia. Mostly to breads. No epigastric pain, nausea,  or vomiting. No other alarm features.   Start taking Protonix 40 mg before dinner.  Advised to prop the head of her bed up on bricks and not to lay down within 3 hours of eating dinner.  Proceed with EGD +/- dilation for dysphagia with propofol with Dr. Gala Romney. The risks, benefits, and alternatives have been discussed in detail with patient. They have stated understanding and desire to proceed.  Follow-up after procedure.

## 2018-11-11 NOTE — Assessment & Plan Note (Signed)
Addressed under GERD 

## 2018-11-11 NOTE — Assessment & Plan Note (Signed)
Patient has had postprandial diarrhea associated with abdominal cramping that is relieved by BMs since restarting Ozempic. Diarrhea daily to every other day. With diarrhea, 2-3 BMs daily. No alarm symptoms. No recent antibiotics, hospitalizations, or sick contacts. Suspect this is med effect. Will provide Bentyl 10 mg daily before meals to help with symptom control at this time.

## 2018-11-13 ENCOUNTER — Other Ambulatory Visit: Payer: Self-pay

## 2018-11-13 ENCOUNTER — Ambulatory Visit (HOSPITAL_COMMUNITY)
Admission: RE | Admit: 2018-11-13 | Discharge: 2018-11-13 | Disposition: A | Discharge status: Home / Self Care | Payer: Medicare Other | Source: Ambulatory Visit | Attending: Family Medicine | Admitting: Family Medicine

## 2018-11-13 DIAGNOSIS — R0989 Other specified symptoms and signs involving the circulatory and respiratory systems: Secondary | ICD-10-CM | POA: Diagnosis not present

## 2018-11-13 DIAGNOSIS — I6523 Occlusion and stenosis of bilateral carotid arteries: Secondary | ICD-10-CM | POA: Diagnosis not present

## 2018-11-13 NOTE — Progress Notes (Signed)
cc'ed to pcp °

## 2018-11-17 ENCOUNTER — Other Ambulatory Visit: Payer: Self-pay | Admitting: Family Medicine

## 2018-11-17 DIAGNOSIS — K219 Gastro-esophageal reflux disease without esophagitis: Secondary | ICD-10-CM

## 2018-11-20 ENCOUNTER — Other Ambulatory Visit: Payer: Self-pay

## 2018-11-20 ENCOUNTER — Telehealth: Payer: Self-pay

## 2018-11-20 DIAGNOSIS — Z86010 Personal history of colonic polyps: Secondary | ICD-10-CM

## 2018-11-20 DIAGNOSIS — R197 Diarrhea, unspecified: Secondary | ICD-10-CM

## 2018-11-20 DIAGNOSIS — R1310 Dysphagia, unspecified: Secondary | ICD-10-CM

## 2018-11-20 DIAGNOSIS — K219 Gastro-esophageal reflux disease without esophagitis: Secondary | ICD-10-CM

## 2018-11-20 MED ORDER — PEG 3350-KCL-NA BICARB-NACL 420 G PO SOLR: 4000.0000 mL | ORAL | 0 refills | Status: DC

## 2018-11-20 NOTE — Telephone Encounter (Signed)
Pre-op appt 01/23/19 at 10:00am. COVID test appt 01/23/19 at 11:00am. Pt aware she will need to quarantine after test until procedure. Appt letters mailed with procedure instructions.

## 2018-11-20 NOTE — Telephone Encounter (Signed)
Called pt, TCS/EGD/-/+DIL w/Propofol w/RMR scheduled for 01/25/19 at 9:00am. Rx for prep sent to pharmacy. Orders entered.

## 2018-11-22 ENCOUNTER — Other Ambulatory Visit: Payer: Self-pay

## 2018-11-22 DIAGNOSIS — K219 Gastro-esophageal reflux disease without esophagitis: Secondary | ICD-10-CM

## 2018-11-22 MED ORDER — ACCU-CHEK AVIVA PLUS VI STRP: ORAL_STRIP | 5 refills | Status: DC

## 2018-11-22 MED ORDER — PANTOPRAZOLE SODIUM 40 MG PO TBEC: 40.0000 mg | DELAYED_RELEASE_TABLET | Freq: Every day | ORAL | 1 refills | Status: DC

## 2018-11-24 ENCOUNTER — Other Ambulatory Visit: Payer: Self-pay | Admitting: Family Medicine

## 2018-11-24 DIAGNOSIS — K219 Gastro-esophageal reflux disease without esophagitis: Secondary | ICD-10-CM

## 2018-11-29 ENCOUNTER — Other Ambulatory Visit: Payer: Self-pay

## 2018-11-29 DIAGNOSIS — K219 Gastro-esophageal reflux disease without esophagitis: Secondary | ICD-10-CM

## 2018-11-29 MED ORDER — PANTOPRAZOLE SODIUM 40 MG PO TBEC: 40.0000 mg | DELAYED_RELEASE_TABLET | Freq: Every day | ORAL | 1 refills | Status: DC

## 2018-12-06 ENCOUNTER — Other Ambulatory Visit: Payer: Self-pay

## 2018-12-06 DIAGNOSIS — E1159 Type 2 diabetes mellitus with other circulatory complications: Secondary | ICD-10-CM

## 2018-12-06 MED ORDER — TOUJEO SOLOSTAR 300 UNIT/ML ~~LOC~~ SOPN: 30.0000 [IU] | PEN_INJECTOR | Freq: Two times a day (BID) | SUBCUTANEOUS | 2 refills | Status: DC

## 2018-12-07 ENCOUNTER — Encounter: Payer: Self-pay | Admitting: Family Medicine

## 2018-12-07 ENCOUNTER — Other Ambulatory Visit: Payer: Self-pay

## 2018-12-07 ENCOUNTER — Ambulatory Visit (INDEPENDENT_AMBULATORY_CARE_PROVIDER_SITE_OTHER): Payer: Medicare Other | Admitting: Family Medicine

## 2018-12-07 VITALS — BP 118/70 | Ht 65.0 in | Wt 182.0 lb

## 2018-12-07 DIAGNOSIS — I2583 Coronary atherosclerosis due to lipid rich plaque: Secondary | ICD-10-CM

## 2018-12-07 DIAGNOSIS — R51 Headache: Secondary | ICD-10-CM | POA: Diagnosis not present

## 2018-12-07 DIAGNOSIS — R519 Headache, unspecified: Secondary | ICD-10-CM

## 2018-12-07 DIAGNOSIS — I2510 Atherosclerotic heart disease of native coronary artery without angina pectoris: Secondary | ICD-10-CM

## 2018-12-07 DIAGNOSIS — J3089 Other allergic rhinitis: Secondary | ICD-10-CM

## 2018-12-07 MED ORDER — FLUTICASONE PROPIONATE 50 MCG/ACT NA SUSP: 2.0000 | Freq: Every day | NASAL | 6 refills | Status: DC

## 2018-12-07 NOTE — Patient Instructions (Signed)
    Thank you for coming into the office today. I appreciate the opportunity to provide you with the care for your health and wellness. Today we discussed: Sinus/allergy symptoms  Follow up: As previously scheduled on December 18, 2018  No labs or referrals today  Happy Birthday!!!   I have sent in [facility] for you to use 2 sprays into both nostrils daily. Please start taking your Claritin daily as well. Make sure that you are hydrating really well with water to help thin out any mucus.  You do want the mucus to drain as if it stays collected it could cause a sinus infection.  You could look at getting a Nettie pot to use to help wash out your sinuses.  Make sure that you either boil your water or use distilled water as directed on the package's.  Use Tylenol as needed for any aches and pains.  If you do not feel that you are getting any better after using the above please contact the office and we will consider looking at possible need for antibiotic at that time.  It usually takes 1 to 2 weeks before the sinus infection has developed secondary to having allergies that have not been controlled.  Usually you can try to bypass get an antibiotic if you treat these appropriately.  Please continue to practice social distancing to keep you, your family, and our community safe.  If you must go out, please wear a Mask and practice good handwashing.  [place] YOUR HANDS WELL AND FREQUENTLY. AVOID TOUCHING YOUR FACE, UNLESS YOUR HANDS ARE FRESHLY WASHED.  GET FRESH AIR DAILY. STAY HYDRATED WITH WATER.   It was a pleasure to see you and I look forward to continuing to work together on your health and well-being. Please do not hesitate to call the office if you need care or have questions about your care.  Have a wonderful day and week. With Gratitude, Cherly Beach, DNP, AGNP-BC

## 2018-12-07 NOTE — Progress Notes (Signed)
Virtual Visit via Telephone Note   This visit type was conducted due to national recommendations for restrictions regarding the COVID-19 Pandemic (e.g. social distancing) in an effort to limit this patient's exposure and mitigate transmission in our community.  Due to her co-morbid illnesses, this patient is at least at moderate risk for complications without adequate follow up.  This format is felt to be most appropriate for this patient at this time.  The patient did not have access to video technology/had technical difficulties with video requiring transitioning to audio format only (telephone).  All issues noted in this document were discussed and addressed.  No physical exam could be performed with this format.    Evaluation Performed:  Follow-up visit  Date:  12/07/2018   ID:  TYLIE GOLONKA, DOB 29-Apr-1945, MRN [id number]  Patient Location: Home Provider Location: Office  Location of Patient: Home Location of Provider: Telehealth Consent was obtain for visit to be over via telehealth. I verified that I am speaking with the correct person using two identifiers.  PCP:  Fayrene Helper, MD   Chief Complaint:  Sinus issues/aches  History of Present Illness:    SHELVIE SALSBERRY is a 73 y.o. female with history of allergies normally takes Claritin during the spring and summer she says is the worst.  Also has a history of CAD, DJD, hypercholesteremia, hypertension hyperlipidemia among others.  Reports today that she has a sinus/frontal headache.  Reports that she takes Tylenol for back pain chronically she is not really received much relief when she takes a Tylenol for this.  She reports she is not been taking her Claritin since the summertime.  She additionally reports that she used to use Flonase but has not used Flonase in a while.  She has a vacation coming up next week and just wanted to check in and make sure that we thought this was just allergy related as well.  She is  not had any exposure to COVID at this time.  She denies fevers, chills, cough or any shortness of breath.  She denies having chewing pain, throat pain.  She reports some intermittent left ear pain at times.  No loss of hearing as of now.  And no chronic pain with that.  She has a nagging slight headache that she says comes and goes.  Along the brow line to the sides.  She does have some thick mucus coming nasally.  Denies having any from her chest.  And denies having a stuffy nose at this time.  She feels it is related to allergies and just wanted to double back and make sure.  The patient does not have symptoms concerning for COVID-19 infection (fever, chills, cough, or new shortness of breath).   Past Medical, Surgical, Social History, Allergies, and Medications have been Reviewed.   Past Medical History:  Diagnosis Date  . Anxiety   . CAD (coronary artery disease)   . Depression   . Diabetes mellitus type II    without complication  . DJD (degenerative joint disease) of lumbar spine   . Hypercholesteremia   . Hyperlipidemia   . Hypertension    benign   . Thyroid cancer ([place]) 2001   Past Surgical History:  Procedure Laterality Date  . ABDOMINAL HYSTERECTOMY    . CARDIAC CATHETERIZATION    . CATARACT EXTRACTION W/PHACO Right 07/17/2012   Procedure: CATARACT EXTRACTION PHACO AND INTRAOCULAR LENS PLACEMENT (IOC);  Surgeon: Tonny Branch, MD;  Location: [facility]  ORS;  Service: Ophthalmology;  Laterality: Right;  CDE:25.51  . CATARACT EXTRACTION W/PHACO Left 09/17/2016   Procedure: CATARACT EXTRACTION PHACO AND INTRAOCULAR LENS PLACEMENT LEFT EYE;  Surgeon: Tonny Branch, MD;  Location: [facility] ORS;  Service: Ophthalmology;  Laterality: Left;  CDE: 19.23  . COLONOSCOPY    . COLONOSCOPY N/A 01/15/2014   Procedure: COLONOSCOPY;  Surgeon: Daneil Dolin, MD;  Location: [facility] END[facility] SUITE;  Service: Endoscopy;  Laterality: N/A;  9:00 AM  . DOPPLER ECHOCARDIOGRAPHY    . JOINT REPLACEMENT  07/01/2010   left hip  .  KNEE SURGERY Right    arthroscopy  . left hip replaced  07/01/2010  . SPINE SURGERY  2006   cervical  . stress dipyridamole myocardial perfusion    . THYROIDECTOMY    . TONSILLECTOMY    . VESICOVAGINAL FISTULA CLOSURE W/ TAH       Current Meds  Medication Sig  . aspirin EC 81 MG tablet Take 1 tablet (81 mg total) by mouth daily.  Marland Kitchen dicyclomine (BENTYL) 10 MG capsule Take 1 capsule (10 mg total) by mouth 4 (four) times daily -  before meals and at bedtime.  . ferrous sulfate 325 (65 FE) MG tablet Take 325 mg by mouth daily with breakfast.  . fluticasone (FLONASE) 50 MCG/ACT nasal spray Place 2 sprays into both nostrils daily.  Marland Kitchen gabapentin (NEURONTIN) 100 MG capsule TAKE 1 CAPSULE(100 MG) BY MOUTH THREE TIMES DAILY  . glucose blood (ACCU-CHEK AVIVA PLUS) test strip Use as instructed two times daily dx E11.65  . imipramine (TOFRANIL) 25 MG tablet TAKE 4 TABLETS BY MOUTH EVERY NIGHT AT BEDTIME  . Insulin Glargine, 1 Unit Dial, (TOUJEO SOLOSTAR) 300 UNIT/ML SOPN Inject 30-50 Units into the skin 2 (two) times daily.  . isosorbide mononitrate (IMDUR) 30 MG 24 hr tablet TAKE 1/2 TABLET(15 MG) BY MOUTH DAILY  . Lancets (ACCU-CHEK MULTICLIX) lancets Use as instructed three times daily dx 250.01  . levothyroxine (SYNTHROID) 175 MCG tablet Take 1 tablet (175 mcg total) by mouth daily before breakfast.  . loratadine (CLARITIN) 10 MG tablet Take 10 mg by mouth daily as needed for allergies.   . Menthol, Topical Analgesic, (ICY HOT EX) Apply 1 application topically 3 (three) times daily as needed (for back pain.).  Marland Kitchen metFORMIN (GLUCOPHAGE) 1000 MG tablet TAKE 1 TABLET TWICE A DAY  . metoprolol tartrate (LOPRESSOR) 50 MG tablet Take 1 tablet (50 mg total) by mouth 2 (two) times daily.  . Multiple Vitamin (MULTIVITAMIN) tablet Take 1 tablet by mouth daily.  . pantoprazole (PROTONIX) 40 MG tablet Take 1 tablet (40 mg total) by mouth daily.  Marland Kitchen PARoxetine (PAXIL) 20 MG tablet Take 1 tablet (20 mg total) by  mouth daily. Patient reports that she takes it 1-2 times week.  . polyethylene glycol-electrolytes (TRILYTE) 420 g solution Take 4,000 mLs by mouth as directed.  . pravastatin (PRAVACHOL) 80 MG tablet TAKE 1 TABLET DAILY  . quinapril (ACCUPRIL) 10 MG tablet TAKE 1 TABLET DAILY  . Semaglutide,0.25 or 0.5MG/DOS, (OZEMPIC, 0.25 OR 0.5 MG/DOSE,) 2 MG/1.5ML SOPN Inject 0.5 mg into the skin once a week.  . simethicone (PHAZYME) 125 MG chewable tablet Chew 1 tablet (125 mg total) by mouth every 6 (six) hours as needed for flatulence.  . verapamil (CALAN-SR) 180 MG CR tablet TAKE 1 TABLET(180 MG) BY MOUTH AT BEDTIME  . vitamin B-12 (CYANOCOBALAMIN) 1000 MCG tablet Take 1 tablet (1,000 mcg total) by mouth daily.  Marland Kitchen VITAMIN D, CHOLECALCIFEROL, PO  Take 1 capsule by mouth daily.  . [DISCONTINUED] fluticasone (FLONASE) 50 MCG/ACT nasal spray Place 2 sprays into both nostrils daily.     Allergies:   Daypro [oxaprozin] and Sulfonamide derivatives   Social History   Tobacco Use  . Smoking status: Passive Smoke Exposure - Never Smoker  . Smokeless tobacco: Never Used  . Tobacco comment: Husband smokes in the home  Substance Use Topics  . Alcohol use: No  . Drug use: No     Family Hx: The patient's family history includes Asthma in her daughter; Heart attack in her father; Heart failure in her mother; Sleep apnea in her son. There is no history of Colon cancer.  ROS:   Please see the history of present illness.    All other systems reviewed and are negative.   Labs/Other Tests and Data Reviewed:     Recent Labs: 01/18/2018: ALT 14; BUN 11; Creat 0.79; Hemoglobin 11.6; Platelets 275; Potassium 4.8; Sodium 138 09/21/2018: TSH 0.06   Recent Lipid Panel Lab Results  Component Value Date/Time   CHOL 167 01/18/2018 10:01 AM   TRIG 127 01/18/2018 10:01 AM   HDL 45 (L) 01/18/2018 10:01 AM   CHOLHDL 3.7 01/18/2018 10:01 AM   LDLCALC 100 (H) 01/18/2018 10:01 AM    Wt Readings from Last 3  Encounters:  12/07/18 182 lb (82.6 kg)  11/08/18 184 lb (83.5 kg)  10/24/18 185 lb (83.9 kg)     Objective:    Vital Signs:  BP 118/70   Ht '5\' 5"'  (1.651 m)   Wt 182 lb (82.6 kg)   LMP 11/11/2016   BMI 30.29 kg/m    VITAL SIGNS:  reviewed GEN:  Alert and oriented RESPIRATORY:  No shortness of breath noted in conversation PSYCH:  Normal affect, mood, good communication  ASSESSMENT & PLAN:    1. Allergic rhinitis due to other allergic trigger, unspecified seasonality Restart Claritin and Flonase. Take as directed. Use Tylenol as needed. If symptoms do not abate by 4 to 5 days from now or they worsen please call the office back will consider calling an antibiotic at that time.  - fluticasone (FLONASE) 50 MCG/ACT nasal spray; Place 2 sprays into both nostrils daily.  Dispense: 16 g; Refill: 6  2. Sinus headache  Discussed diagnosis and treatment of sinus/allergy  Suggested symptomatic OTC remedies. Nasal saline spray for congestion.  Tylenol or Ibuprofen OTC for fever and malaise if they develop.  Consider sinus rinses (Neti-pot or Sinus Rinse Kit). Discussed Mucinex, other supportive measures as needed. Warm fluids to soothe any throat discomfort. Rest and Fresh air daily.   Call/return in 3-5 days if symptoms persist/worsen   Time:   Today, I have spent 10 minutes with the patient with telehealth technology discussing the above problems.     Medication Adjustments/Labs and Tests Ordered: Current medicines are reviewed at length with the patient today.  Concerns regarding medicines are outlined above.   Tests Ordered: No orders of the defined types were placed in this encounter.   Medication Changes: Meds ordered this encounter  Medications  . fluticasone (FLONASE) 50 MCG/ACT nasal spray    Sig: Place 2 sprays into both nostrils daily.    Dispense:  16 g    Refill:  6    Order Specific Question:   Supervising Provider    Answer:   Fayrene Helper [[id number]]     Disposition:  Follow up 12/18/2018   Signed, Perlie Mayo, NP  12/07/2018 11:23  AM     [facility]

## 2018-12-12 ENCOUNTER — Telehealth: Payer: Self-pay | Admitting: Internal Medicine

## 2018-12-12 NOTE — Telephone Encounter (Signed)
Unfortunately, we will then need to stop it >> may need to use Glipizide 5 mg 2x a day before b'fast and dinner to start with and she can let us know how this goes in 1-2 weeks.

## 2018-12-12 NOTE — Telephone Encounter (Signed)
Pt called would like to speak to you. Pt states she is in the donut hole with medication   Semaglutide,0.25 or 0.5MG /DOS, (OZEMPIC, 0.25 OR 0.5 MG/DOSE,) 2 MG/1.5ML SOPN   It's pricey for her too much for both medications. Please advise?  Pharmacy is [id number] - [place], [place]  Call pt @ [phone]. Thank you!

## 2018-12-13 NOTE — Telephone Encounter (Signed)
Left message for patient to return our call at [phone].  

## 2018-12-18 ENCOUNTER — Encounter: Payer: Self-pay | Admitting: Family Medicine

## 2018-12-18 ENCOUNTER — Ambulatory Visit (INDEPENDENT_AMBULATORY_CARE_PROVIDER_SITE_OTHER): Payer: Medicare Other | Admitting: Family Medicine

## 2018-12-18 ENCOUNTER — Other Ambulatory Visit: Payer: Self-pay

## 2018-12-18 VITALS — BP 120/70 | Temp 98.6°F | Resp 15 | Ht 65.0 in | Wt 186.0 lb

## 2018-12-18 DIAGNOSIS — E66811 Obesity, class 1: Secondary | ICD-10-CM

## 2018-12-18 DIAGNOSIS — E1159 Type 2 diabetes mellitus with other circulatory complications: Secondary | ICD-10-CM | POA: Diagnosis not present

## 2018-12-18 DIAGNOSIS — E782 Mixed hyperlipidemia: Secondary | ICD-10-CM

## 2018-12-18 DIAGNOSIS — F341 Dysthymic disorder: Secondary | ICD-10-CM

## 2018-12-18 DIAGNOSIS — I10 Essential (primary) hypertension: Secondary | ICD-10-CM

## 2018-12-18 DIAGNOSIS — Z23 Encounter for immunization: Secondary | ICD-10-CM

## 2018-12-18 DIAGNOSIS — I2583 Coronary atherosclerosis due to lipid rich plaque: Secondary | ICD-10-CM

## 2018-12-18 DIAGNOSIS — E669 Obesity, unspecified: Secondary | ICD-10-CM

## 2018-12-18 DIAGNOSIS — I2510 Atherosclerotic heart disease of native coronary artery without angina pectoris: Secondary | ICD-10-CM | POA: Diagnosis not present

## 2018-12-18 NOTE — Patient Instructions (Addendum)
F./U in 5 months, call if you need me before  Flu vaccine today  Fasting lipid, cmp and EGFR, Vit D, microab this week   Congrats on improved health habits  Discuss with Dr Sydell Axon protonix dosing and taper please  Thanks for choosing [facility], we consider it a privelige to serve you.

## 2018-12-19 ENCOUNTER — Encounter: Payer: Self-pay | Admitting: Family Medicine

## 2018-12-19 DIAGNOSIS — E1142 Type 2 diabetes mellitus with diabetic polyneuropathy: Secondary | ICD-10-CM | POA: Diagnosis not present

## 2018-12-19 NOTE — Assessment & Plan Note (Signed)
  Patient re-educated about  the importance of commitment to a  minimum of 150 minutes of exercise per week as able.  The importance of healthy food choices with portion control discussed, as well as eating regularly and within a 12 hour window most days. The need to choose "clean , green" food 50 to 75% of the time is discussed, as well as to make water the primary drink and set a goal of 64 ounces water daily.    Weight /BMI 12/18/2018 12/07/2018 11/08/2018  WEIGHT 186 lb 182 lb 184 lb  HEIGHT 5\' 5"  5\' 5"  5\' 0"   BMI 30.95 kg/m2 30.29 kg/m2 35.94 kg/m2

## 2018-12-19 NOTE — Assessment & Plan Note (Signed)
Controlled, no change in medication DASH diet and commitment to daily physical activity for a minimum of 30 minutes discussed and encouraged, as a part of hypertension management. The importance of attaining a healthy weight is also discussed.  BP/Weight 12/18/2018 12/07/2018 11/08/2018 10/24/2018 09/21/2018 [id number]  Systolic BP 123456 123456 [id number] 123456 123456  Diastolic BP 70 70 76 70 60 70 70  Wt. (Lbs) 186 182 184 185 187 184 184  BMI 30.95 30.29 35.94 31.26 31.6 30.62 30.62

## 2018-12-19 NOTE — Assessment & Plan Note (Signed)
Controlled, no change in medication  

## 2018-12-19 NOTE — Progress Notes (Signed)
Jennifer Blackburn     MRN: [id number]      DOB: [birth year]   HPI Jennifer Blackburn is here for follow up and re-evaluation of chronic medical conditions, medication management and review of any available recent lab and radiology data.  Preventive health is updated, specifically  Cancer screening and Immunization.   Questions or concerns regarding consultations or procedures which the PT has had in the interim are  addressed. The PT denies any adverse reactions to current medications since the last visit. Cost of diabetes medication ios however prohibitive as she is now in the donut hole Denies polyuria, polydipsia, blurred vision , or hypoglycemic episodes. Has stopped protonix independently however has h/o strictures so I have advised GI input in this regard There are no new concerns.  There are no specific complaints   ROS Denies recent fever or chills. Denies sinus pressure, nasal congestion, ear pain or sore throat. Denies chest congestion, productive cough or wheezing. Denies chest pains, palpitations and leg swelling Denies abdominal pain, nausea, vomiting,diarrhea or constipation.   Denies dysuria, frequency, hesitancy or incontinence. Denies uncontrolled joint pain, swelling and limitation in mobility. Denies headaches, seizures, numbness, or tingling. Denies  Uncontrolled depression, anxiety or insomnia. Denies skin break down or rash.   PE  BP 120/70   Temp 98.6 F (37 C) (Temporal)   Resp 15   Ht 5\' 5"  (1.651 m)   Wt 186 lb (84.4 kg)   LMP 11/11/2016   BMI 30.95 kg/m   Patient alert and oriented and in no cardiopulmonary distress.  HEENT: No facial asymmetry, EOMI,   oropharynx pink and moist.  Neck supple no JVD, no mass.  Chest: Clear to auscultation bilaterally.  CVS: S1, S2 no murmurs, no S3.Regular rate.  ABD: Soft non tender.   Ext: No edema  MS: Adequate ROM spine, shoulders, hips and knees.  Skin: Intact, no ulcerations or rash noted.  Psych: Good  eye contact, normal affect. Memory intact not anxious or depressed appearing.  CNS: CN 2-12 intact, power,  normal throughout.no focal deficits noted.   Assessment & Plan  Hypertension Controlled, no change in medication DASH diet and commitment to daily physical activity for a minimum of 30 minutes discussed and encouraged, as a part of hypertension management. The importance of attaining a healthy weight is also discussed.  BP/Weight 12/18/2018 12/07/2018 11/08/2018 10/24/2018 09/21/2018 [id number]  Systolic BP 123456 123456 [id number] 123456 123456  Diastolic BP 70 70 76 70 60 70 70  Wt. (Lbs) 186 182 184 185 187 184 184  BMI 30.95 30.29 35.94 31.26 31.6 30.62 30.62       Hyperlipidemia Hyperlipidemia:Low fat diet discussed and encouraged.   Lipid Panel  Lab Results  Component Value Date   CHOL 167 01/18/2018   HDL 45 (L) 01/18/2018   LDLCALC 100 (H) 01/18/2018   TRIG 127 01/18/2018   CHOLHDL 3.7 01/18/2018  Updated lab needed at/ before next visit. Not at goal     Type 2 diabetes mellitus with vascular disease [facility]) Managed by Endo however med availability an issue, uncontrolled  Jennifer Blackburn is reminded of the importance of commitment to daily physical activity for 30 minutes or more, as able and the need to limit carbohydrate intake to 30 to 60 grams per meal to help with blood sugar control.   The need to take medication as prescribed, test blood sugar as directed, and to call between visits if there is a concern that blood sugar  is uncontrolled is also discussed.   Jennifer Blackburn is reminded of the importance of daily foot exam, annual eye examination, and good blood sugar, blood pressure and cholesterol control.  Diabetic Labs Latest Ref Rng & Units 09/21/2018 05/26/2018 02/07/2018 01/18/2018 08/06/2017  HbA1c 4.0 - 5.6 % 8.9(A) 8.4(A) 8.0(A) - 8.9  Microalbumin Not Estab. ug/mL - - - - -  Micro/Creat Ratio 0.0 - 30.0 mg/g creat - - - - -  Chol <200 mg/dL - - - 167 -  HDL >50  mg/dL - - - 45(L) -  Calc LDL mg/dL (calc) - - - 100(H) -  Triglycerides <150 mg/dL - - - 127 -  Creatinine 0.60 - 0.93 mg/dL - - - 0.79 -   BP/Weight 12/18/2018 12/07/2018 11/08/2018 10/24/2018 09/21/2018 [id number]  Systolic BP 123456 123456 [id number] 123456 123456  Diastolic BP 70 70 76 70 60 70 70  Wt. (Lbs) 186 182 184 185 187 184 184  BMI 30.95 30.29 35.94 31.26 31.6 30.62 30.62   Foot/eye exam completion dates Latest Ref Rng & Units 05/24/2018 01/10/2018  Eye Exam No Retinopathy No Retinopathy -  Foot exam Order - - -  Foot Form Completion - - Done        Depression Controlled, no change in medication   Obesity (BMI 30.0-34.9)  Patient re-educated about  the importance of commitment to a  minimum of 150 minutes of exercise per week as able.  The importance of healthy food choices with portion control discussed, as well as eating regularly and within a 12 hour window most days. The need to choose "clean , green" food 50 to 75% of the time is discussed, as well as to make water the primary drink and set a goal of 64 ounces water daily.    Weight /BMI 12/18/2018 12/07/2018 11/08/2018  WEIGHT 186 lb 182 lb 184 lb  HEIGHT 5\' 5"  5\' 5"  5\' 0"   BMI 30.95 kg/m2 30.29 kg/m2 35.94 kg/m2

## 2018-12-19 NOTE — Assessment & Plan Note (Signed)
Managed by Endo however med availability an issue, uncontrolled  Jennifer Blackburn is reminded of the importance of commitment to daily physical activity for 30 minutes or more, as able and the need to limit carbohydrate intake to 30 to 60 grams per meal to help with blood sugar control.   The need to take medication as prescribed, test blood sugar as directed, and to call between visits if there is a concern that blood sugar is uncontrolled is also discussed.   Jennifer Blackburn is reminded of the importance of daily foot exam, annual eye examination, and good blood sugar, blood pressure and cholesterol control.  Diabetic Labs Latest Ref Rng & Units 09/21/2018 05/26/2018 02/07/2018 01/18/2018 08/06/2017  HbA1c 4.0 - 5.6 % 8.9(A) 8.4(A) 8.0(A) - 8.9  Microalbumin Not Estab. ug/mL - - - - -  Micro/Creat Ratio 0.0 - 30.0 mg/g creat - - - - -  Chol <200 mg/dL - - - 167 -  HDL >50 mg/dL - - - 45(L) -  Calc LDL mg/dL (calc) - - - 100(H) -  Triglycerides <150 mg/dL - - - 127 -  Creatinine 0.60 - 0.93 mg/dL - - - 0.79 -   BP/Weight 12/18/2018 12/07/2018 11/08/2018 10/24/2018 09/21/2018 [id number]  Systolic BP 123456 123456 [id number] 123456 123456  Diastolic BP 70 70 76 70 60 70 70  Wt. (Lbs) 186 182 184 185 187 184 184  BMI 30.95 30.29 35.94 31.26 31.6 30.62 30.62   Foot/eye exam completion dates Latest Ref Rng & Units 05/24/2018 01/10/2018  Eye Exam No Retinopathy No Retinopathy -  Foot exam Order - - -  Foot Form Completion - - Done

## 2018-12-19 NOTE — Assessment & Plan Note (Signed)
Hyperlipidemia:Low fat diet discussed and encouraged.   Lipid Panel  Lab Results  Component Value Date   CHOL 167 01/18/2018   HDL 45 (L) 01/18/2018   LDLCALC 100 (H) 01/18/2018   TRIG 127 01/18/2018   CHOLHDL 3.7 01/18/2018  Updated lab needed at/ before next visit. Not at goal

## 2018-12-20 MED ORDER — GLIPIZIDE 5 MG PO TABS: 5.0000 mg | ORAL_TABLET | Freq: Two times a day (BID) | ORAL | 3 refills | Status: DC

## 2018-12-20 NOTE — Addendum Note (Signed)
Addended by: Cardell Peach I on: 12/20/2018 11:50 AM   Modules accepted: Orders

## 2018-12-20 NOTE — Telephone Encounter (Signed)
Patient returned call, I let her know about the Glipizide and she agrees to try it. I have sent that in to the pharmacy.  I also gave her the number for Novo Nordisk PAP to call and if she qualifies for assistance with Ozempic, if she does she will let us know and we can get the paperwork started.  Patient expressed understanding and agreement.

## 2018-12-21 ENCOUNTER — Telehealth: Payer: Self-pay

## 2018-12-21 DIAGNOSIS — E785 Hyperlipidemia, unspecified: Secondary | ICD-10-CM

## 2018-12-21 DIAGNOSIS — E1159 Type 2 diabetes mellitus with other circulatory complications: Secondary | ICD-10-CM

## 2018-12-21 DIAGNOSIS — E559 Vitamin D deficiency, unspecified: Secondary | ICD-10-CM

## 2018-12-21 DIAGNOSIS — I10 Essential (primary) hypertension: Secondary | ICD-10-CM

## 2018-12-21 NOTE — Telephone Encounter (Signed)
Expired labs reordered  

## 2018-12-22 ENCOUNTER — Telehealth: Payer: Self-pay | Admitting: *Deleted

## 2018-12-22 DIAGNOSIS — E785 Hyperlipidemia, unspecified: Secondary | ICD-10-CM

## 2018-12-22 DIAGNOSIS — I10 Essential (primary) hypertension: Secondary | ICD-10-CM

## 2018-12-22 LAB — COMPLETE METABOLIC PANEL WITH GFR
AG Ratio: 1.2 (calc) (ref 1.0–2.5)
ALT: 19 U/L (ref 6–29)
AST: 24 U/L (ref 10–35)
Albumin: 4.2 g/dL (ref 3.6–5.1)
Alkaline phosphatase (APISO): 40 U/L (ref 37–153)
BUN: 15 mg/dL (ref 7–25)
CO2: 30 mmol/L (ref 20–32)
Calcium: 9.1 mg/dL (ref 8.6–10.4)
Chloride: 100 mmol/L (ref 98–110)
Creat: 0.76 mg/dL (ref 0.60–0.93)
GFR, Est African American: 90 mL/min/{1.73_m2} (ref >=60)
GFR, Est Non African American: 77 mL/min/{1.73_m2} (ref >=60)
Globulin: 3.4 g/dL (calc) (ref 1.9–3.7)
Glucose, Bld: 173 mg/dL — ABNORMAL HIGH (ref 65–99)
Potassium: 4.3 mmol/L (ref 3.5–5.3)
Sodium: 139 mmol/L (ref 135–146)
Total Bilirubin: 0.3 mg/dL (ref 0.2–1.2)
Total Protein: 7.6 g/dL (ref 6.1–8.1)

## 2018-12-22 LAB — LIPID PANEL
Cholesterol: 189 mg/dL (ref <200)
HDL: 42 mg/dL — ABNORMAL LOW (ref >=50)
LDL Cholesterol (Calc): 117 mg/dL (calc) — ABNORMAL HIGH
Non-HDL Cholesterol (Calc): 147 mg/dL (calc) — ABNORMAL HIGH (ref <130)
Total CHOL/HDL Ratio: 4.5 (calc) (ref <5.0)
Triglycerides: 179 mg/dL — ABNORMAL HIGH (ref <150)

## 2018-12-22 LAB — VITAMIN D 25 HYDROXY (VIT D DEFICIENCY, FRACTURES): Vit D, 25-Hydroxy: 19 ng/mL — ABNORMAL LOW (ref 30–100)

## 2018-12-22 LAB — MICROALBUMIN, URINE: Microalb, Ur: 1.5 mg/dL

## 2018-12-22 NOTE — Telephone Encounter (Signed)
Patient is fine with being changed to Atorvastatin 40mg . Gave lab results with verbal understanding.

## 2018-12-22 NOTE — Telephone Encounter (Signed)
Pt called returning abbys call would like a call back

## 2018-12-25 NOTE — Telephone Encounter (Signed)
Gave patient her lab results on Friday

## 2018-12-25 NOTE — Telephone Encounter (Signed)
Gave lab results with verbal understanding. [place] with being changed to Atorvastatin 40mg . Labs ordered.

## 2018-12-26 ENCOUNTER — Other Ambulatory Visit: Payer: Self-pay

## 2018-12-28 ENCOUNTER — Ambulatory Visit (INDEPENDENT_AMBULATORY_CARE_PROVIDER_SITE_OTHER): Payer: Medicare Other | Admitting: Internal Medicine

## 2018-12-28 ENCOUNTER — Other Ambulatory Visit: Payer: Self-pay | Admitting: Family Medicine

## 2018-12-28 ENCOUNTER — Other Ambulatory Visit: Payer: Self-pay | Admitting: Cardiovascular Disease

## 2018-12-28 ENCOUNTER — Other Ambulatory Visit: Payer: Self-pay

## 2018-12-28 ENCOUNTER — Encounter: Payer: Self-pay | Admitting: Internal Medicine

## 2018-12-28 VITALS — BP 110/60 | HR 69 | Ht 64.5 in | Wt 187.0 lb

## 2018-12-28 DIAGNOSIS — I2510 Atherosclerotic heart disease of native coronary artery without angina pectoris: Secondary | ICD-10-CM

## 2018-12-28 DIAGNOSIS — C73 Malignant neoplasm of thyroid gland: Secondary | ICD-10-CM | POA: Diagnosis not present

## 2018-12-28 DIAGNOSIS — E1159 Type 2 diabetes mellitus with other circulatory complications: Secondary | ICD-10-CM | POA: Diagnosis not present

## 2018-12-28 DIAGNOSIS — E890 Postprocedural hypothyroidism: Secondary | ICD-10-CM | POA: Diagnosis not present

## 2018-12-28 DIAGNOSIS — I2583 Coronary atherosclerosis due to lipid rich plaque: Secondary | ICD-10-CM

## 2018-12-28 LAB — POCT GLYCOSYLATED HEMOGLOBIN (HGB A1C): Hemoglobin A1C: 8.8 % — AB (ref 4.0–5.6)

## 2018-12-28 MED ORDER — ATORVASTATIN CALCIUM 40 MG PO TABS: 40.0000 mg | ORAL_TABLET | Freq: Every day | ORAL | 3 refills | Status: DC

## 2018-12-28 MED ORDER — NOVOLOG FLEXPEN 100 UNIT/ML ~~LOC~~ SOPN: 8.0000 [IU] | PEN_INJECTOR | Freq: Three times a day (TID) | SUBCUTANEOUS | 11 refills | Status: DC

## 2018-12-28 MED ORDER — INSULIN PEN NEEDLE 32G X 4 MM MISC: 3 refills | Status: AC

## 2018-12-28 NOTE — Progress Notes (Signed)
Patient ID: [place], female   DOB: [birth year], 73 y.o.   MRN: [id number]   HPI: [place] is a 73 y.o.-year-old female, returning for follow-up for DM2, dx in 2001, insulin-dependent since 2017, uncontrolled, with long-term complications (DR, CAD) and also papillary thyroid cancer and postsurgical hypothyroidism.  Last visit 3 months ago.  DM2: Reviewed latest HbA1c level: Lab Results  Component Value Date   HGBA1C 8.9 (A) 09/21/2018   HGBA1C 8.4 (A) 05/26/2018   HGBA1C 8.0 (A) 02/07/2018   HGBA1C 8.9 08/06/2017   HGBA1C 8.0 10/20/2015   HGBA1C 9.0 (H) 11/14/2014   HGBA1C 9.0 (H) 07/10/2014   HGBA1C 9.2 (H) 12/28/2013   HGBA1C 8.0 (H) 07/30/2013   HGBA1C 9.1 (H) 05/01/2013  01/12/2018: HbA1c 8.7% 08/05/2017: HbA1c 8.9% 07/11/2017: HbA1c 9.1%  Pt is on a regimen of: - Metformin 1000 mg 2x a day with meals. - Toujeo 30 units in a.m. and 50 units at bedtime  >> could not afford it (donut hole) >> now Glipizide 5 mg 2x a day She was on Ozempic 0.5 mg weekly in a.m. >> 1 mg  - constipation >> stopped 06/2018. She was on Victoza >> $$$. She was on Jardiance >> $$$. She was on Actos >> swelling - stopped Spring 2019. She was on Glipizide XL 5 mg daily >> stopped 07/2017.  Pt checks her sugars 1-2 times daily: - am: 150-180 >> 137-163 >> 110-160 >> 120-140 - 2h after b'fast: n/c - before lunch: n/c - 2h after lunch: n/c >> 265-300s - before dinner: n/c - 2h after dinner: n/c - bedtime: 198-210, 320 >> 230-240 >> 200s >> n/c - nighttime: n/c Lowest sugar was 110 >> 137; she has hypoglycemia awareness in the 60s. Highest sugar was 320 >> 240 .  Glucometer: AccuChek Aviva  Pt's meals are: - Breakfast:toast, cereal, milk; oatmeal; bacon + egg - Lunch: skips  - Dinner: hamburger, or chicken, or fish, + veggies, bread, water and tea - Snacks: 2- fruit She was seeing a dietitian in [place]. She continues to walk.  -No CKD, last BUN/creatinine:  Lab Results   Component Value Date   BUN 15 12/21/2018   BUN 11 01/18/2018   CREATININE 0.76 12/21/2018   CREATININE 0.79 01/18/2018  On quinapril 10.  -+ HL; last set of lipids: Lab Results  Component Value Date   CHOL 189 12/21/2018   HDL 42 (L) 12/21/2018   LDLCALC 117 (H) 12/21/2018   TRIG 179 (H) 12/21/2018   CHOLHDL 4.5 12/21/2018  On pravastatin 80.  - last eye exam was in 05/2018:?  DR. Dr. Eulas Post in [place]. + cataract sx in both eyes.  - no numbness and tingling in her feet. Has a podiatrist.   On ASA 81.  Pt has no FH of DM.  She also has a h/o Thyroid cancer (2001), s/p RAI treatment, now with postsurgical hypothyroidism.  At last visit, her TSH was suppressed so we decreased the dose of levothyroxine from 200 mcg to 175 mcg daily: - in am - fasting - at least 30 min from b'fast - no Ca, MVI, PPIs - + Fe 4 hours later - not on Biotin  She did not return for repeat TSH after the change in dose.  Reviewed her TFTs Lab Results  Component Value Date   TSH 0.06 (L) 09/21/2018   TSH 0.61 05/26/2018   TSH 2.49 03/23/2016   TSH 0.070 (L) 10/13/2010   TSH 0.035 (L) 09/22/2006   TSH  0.015 (L) 06/17/2006  11/03/2017: TSH 0.044  Thyroglobulin levels were detectable but decreased at last check: Lab Results  Component Value Date   THYROGLB 0.3 (L) 09/21/2018   THYROGLB 0.7 (L) 05/26/2018   Lab Results  Component Value Date   THGAB <1 09/21/2018   THGAB <1 05/26/2018   Thyroid ultrasound (06/10/2018): 1.1 cm nonspecific calcified lesion in the right upper neck.  CT was recommended: There is no residual or recurrent tissue in the right or left thyroid beds. There is no evidence of abnormal adenopathy by short axis diameter measurement criteria There is a nonspecific calcified soft tissue mass measuring 1.1 cm in the right superior neck. This is of unknown significance. CT neck can be performed to further characterize.  CT (10/18/2018): Status post thyroidectomy. No  soft tissue mass within the thyroidectomy bed. A 13 x 7 mm ovoid focus of calcification in the right aspect of the thyroidectomy bed likely corresponding with the finding on recent neck ultrasound and is unchanged as compared to neck CT 10/24/2007, benign.  No pathologically enlarged cervical chain lymph nodes. A nonenlarged calcified right supraclavicular lymph node is new from prior neck CT 10/24/2007 but also favored benign. Attention recommended on follow-up.  Reviewed records per Dr. Ronnald [place]: Patient is status post total thyroidectomy in 2004 1.3 cm papillary thyroid cancer of the right lobe, followed by 3x RAI tx's with  33 mCi I-131 and a fourth RAI dose inpatient: 125 mCi, at [facility].    Whole-body scan on 09/08/2007 showed an increase in the thyroid bed activity and the follow up study with PET scan 10/08/2007 showed mildly hypermetabolic cervical lymph nodes.    Repeat PET 05/10/2008 showed no significant changes felt likely to be due to to a reactive process.  11/03/2017: TSH 0.044, thyroglobulin 0.2, ATA <1.0  04/05/2017: TSH 0.021 (0.45-4.5), thyroglobulin 0.3 (by IMA), ATA <1.0  She also has a history of HTN, anemia  Her daughter lives in [place] and she is sick.  Patient travels between here in [place] frequently.   ROS: Constitutional: no weight gain/no weight loss, no fatigue, no subjective hyperthermia, no subjective hypothermia Eyes: no blurry vision, no xerophthalmia ENT: no sore throat, + see HPI Cardiovascular: no CP/no SOB/no palpitations/no leg swelling Respiratory: no cough/no SOB/no wheezing Gastrointestinal: no N/no V/no D/no C/no acid reflux Musculoskeletal: no muscle aches/no joint aches Skin: no rashes, no hair loss Neurological: no tremors/no numbness/no tingling/no dizziness  I reviewed pt's medications, allergies, PMH, social hx, family hx, and changes were documented in the history of present illness. Otherwise, unchanged from my initial  visit note.  Past Medical History:  Diagnosis Date  . Anxiety   . CAD (coronary artery disease)   . Depression   . Diabetes mellitus type II    without complication  . DJD (degenerative joint disease) of lumbar spine   . Hypercholesteremia   . Hyperlipidemia   . Hypertension    benign   . Thyroid cancer ([place]) 2001   Past Surgical History:  Procedure Laterality Date  . ABDOMINAL HYSTERECTOMY    . CARDIAC CATHETERIZATION    . CATARACT EXTRACTION W/PHACO Right 07/17/2012   Procedure: CATARACT EXTRACTION PHACO AND INTRAOCULAR LENS PLACEMENT (IOC);  Surgeon: Tonny Branch, MD;  Location: [facility];  Service: Ophthalmology;  Laterality: Right;  CDE:25.51  . CATARACT EXTRACTION W/PHACO Left 09/17/2016   Procedure: CATARACT EXTRACTION PHACO AND INTRAOCULAR LENS PLACEMENT LEFT EYE;  Surgeon: Tonny Branch, MD;  Location: [facility];  Service: Ophthalmology;  Laterality: Left;  CDE: 19.23  . COLONOSCOPY    . COLONOSCOPY N/A 01/15/2014   Procedure: COLONOSCOPY;  Surgeon: Daneil Dolin, MD;  Location: [facility] SUITE;  Service: Endoscopy;  Laterality: N/A;  9:00 AM  . DOPPLER ECHOCARDIOGRAPHY    . JOINT REPLACEMENT  07/01/2010   left hip  . KNEE SURGERY Right    arthroscopy  . left hip replaced  07/01/2010  . SPINE SURGERY  2006   cervical  . stress dipyridamole myocardial perfusion    . THYROIDECTOMY    . TONSILLECTOMY    . VESICOVAGINAL FISTULA CLOSURE W/ TAH     Social History   Socioeconomic History  . Marital status: Married    Spouse name: Not on file  . Number of children: 2  . Years of education: 65  . Highest education level: Not on file  Occupational History  . Occupation: retired     Comment: Company secretary  . Financial resource strain: Not hard at all  . Food insecurity    Worry: Never true    Inability: Never true  . Transportation needs    Medical: No    Non-medical: No  Tobacco Use  . Smoking status: Passive Smoke Exposure - Never Smoker  . Smokeless tobacco: Never  Used  . Tobacco comment: Husband smokes in the home  Substance and Sexual Activity  . Alcohol use: No  . Drug use: No  . Sexual activity: Never  Lifestyle  . Physical activity    Days per week: 5 days    Minutes per session: 40 min  . Stress: Not at all  Relationships  . Social connections    Talks on phone: More than three times a week    Gets together: Three times a week    Attends religious service: More than 4 times per year    Active member of club or organization: Yes    Attends meetings of clubs or organizations: More than 4 times per year    Relationship status: Married  . Intimate partner violence    Fear of current or ex partner: No    Emotionally abused: No    Physically abused: No    Forced sexual activity: No  Other Topics Concern  . Not on file  Social History Narrative   Lives with husband, at home   No caffeine   Current Outpatient Medications  Medication Sig Dispense Refill  . aspirin EC 81 MG tablet Take 1 tablet (81 mg total) by mouth daily.    Marland Kitchen atorvastatin (LIPITOR) 40 MG tablet Take 1 tablet (40 mg total) by mouth daily. 90 tablet 3  . dicyclomine (BENTYL) 10 MG capsule Take 1 capsule (10 mg total) by mouth 4 (four) times daily -  before meals and at bedtime. 90 capsule 1  . ferrous sulfate 325 (65 FE) MG tablet Take 325 mg by mouth daily with breakfast.    . fluticasone (FLONASE) 50 MCG/ACT nasal spray Place 2 sprays into both nostrils daily. 16 g 6  . gabapentin (NEURONTIN) 100 MG capsule TAKE 1 CAPSULE(100 MG) BY MOUTH THREE TIMES DAILY 270 capsule 1  . glipiZIDE (GLUCOTROL) 5 MG tablet Take 1 tablet (5 mg total) by mouth 2 (two) times daily before a meal. 60 tablet 3  . glucose blood (ACCU-CHEK AVIVA PLUS) test strip Use as instructed two times daily dx E11.65 150 each 5  . imipramine (TOFRANIL) 25 MG tablet TAKE 4 TABLETS BY MOUTH EVERY NIGHT AT  BEDTIME 120 tablet 5  . Insulin Glargine, 1 Unit Dial, (TOUJEO SOLOSTAR) 300 UNIT/ML SOPN Inject 30-50  Units into the skin 2 (two) times daily. 24 mL 2  . isosorbide mononitrate (IMDUR) 30 MG 24 hr tablet TAKE HALF A TABLET BY MOUTH EVERY DAY 45 tablet 3  . Lancets (ACCU-CHEK MULTICLIX) lancets Use as instructed three times daily dx 250.01 100 each 5  . levothyroxine (SYNTHROID) 175 MCG tablet Take 1 tablet (175 mcg total) by mouth daily before breakfast. 45 tablet 3  . loratadine (CLARITIN) 10 MG tablet Take 10 mg by mouth daily as needed for allergies.     . Menthol, Topical Analgesic, (ICY HOT EX) Apply 1 application topically 3 (three) times daily as needed (for back pain.).    Marland Kitchen metFORMIN (GLUCOPHAGE) 1000 MG tablet TAKE 1 TABLET TWICE A DAY 180 tablet 1  . metoprolol tartrate (LOPRESSOR) 50 MG tablet Take 1 tablet (50 mg total) by mouth 2 (two) times daily. 180 tablet 3  . Multiple Vitamin (MULTIVITAMIN) tablet Take 1 tablet by mouth daily.    . pantoprazole (PROTONIX) 40 MG tablet Take 1 tablet (40 mg total) by mouth daily. 90 tablet 1  . PARoxetine (PAXIL) 20 MG tablet Take 1 tablet (20 mg total) by mouth daily. Patient reports that she takes it 1-2 times week. 90 tablet 3  . polyethylene glycol-electrolytes (TRILYTE) 420 g solution Take 4,000 mLs by mouth as directed. 4000 mL 0  . pravastatin (PRAVACHOL) 80 MG tablet TAKE 1 TABLET DAILY 90 tablet 1  . quinapril (ACCUPRIL) 10 MG tablet TAKE 1 TABLET DAILY 90 tablet 1  . simethicone (PHAZYME) 125 MG chewable tablet Chew 1 tablet (125 mg total) by mouth every 6 (six) hours as needed for flatulence. 30 tablet 0  . verapamil (CALAN-SR) 180 MG CR tablet TAKE 1 TABLET(180 MG) BY MOUTH AT BEDTIME 30 tablet 11  . vitamin B-12 (CYANOCOBALAMIN) 1000 MCG tablet Take 1 tablet (1,000 mcg total) by mouth daily. 30 tablet 5  . VITAMIN D, CHOLECALCIFEROL, PO Take 1 capsule by mouth daily.    . Semaglutide,0.25 or 0.5MG /DOS, (OZEMPIC, 0.25 OR 0.5 MG/DOSE,) 2 MG/1.5ML SOPN Inject 0.5 mg into the skin once a week. (Patient not taking: Reported on 12/28/2018) 2  pen 2   No current facility-administered medications for this visit.      Allergies  Allergen Reactions  . Daypro [Oxaprozin] Hives  . Sulfonamide Derivatives Hives   Family History  Problem Relation Age of Onset  . Heart attack Father   . Heart failure Mother   . Asthma Daughter   . Sleep apnea Son        CPAP  . Colon cancer Neg Hx     PE: BP 110/60   Pulse 69   Ht 5' 4.5" (1.638 m) Comment: measured today  Wt 187 lb (84.8 kg)   LMP 11/11/2016   SpO2 99%   BMI 31.60 kg/m  Wt Readings from Last 3 Encounters:  12/28/18 187 lb (84.8 kg)  12/18/18 186 lb (84.4 kg)  12/07/18 182 lb (82.6 kg)   Constitutional: overweight, in NAD Eyes: PERRLA, EOMI, no exophthalmos ENT: moist mucous membranes, no neck masses palpated, no cervical lymphadenopathy Cardiovascular: RRR, No MRG Respiratory: CTA B Gastrointestinal: abdomen soft, NT, ND, BS+ Musculoskeletal: no deformities, strength intact in all 4 Skin: moist, warm, no rashes Neurological: no tremor with outstretched hands, DTR normal in all 4   ASSESSMENT: 1. DM2, insulin-dependent, uncontrolled, with long-term complications - CAD -  DR  2.  Papillary thyroid cancer  3.  Postsurgical hypothyroidism  PLAN:  1. Patient with longstanding, uncontrolled, type 2 diabetes, on metformin, long-acting insulin, and Ozempic added back at last visit.  In the past, she stopped this due to constipation.  At last visit she was willing to retry the lower dose.  We discussed about adding Metamucil and staying very well Hydrated while on this.  However, she could not afford the Ozempic and we started glipizide before meals.  This is now working  too well.  Her sugars are not going in the morning but they increased significantly after lunch, in the 200s to 300s range.  Therefore for now, I suggested mealtime insulin.  She agrees to start this.  I advised her to take NovoLog 15 minutes before each main meal.  We will stop glipizide.  As we are  adding rapid acting insulin, I advised her to decrease the Toujeo dose at night.  We will continue the Metformin. - I suggested to:  Patient Instructions  Please continue: - Metformin 1000 mg 2x a day with meals.  Decrease: - Toujeo 30 units in a.m. and 40 units at bedtime  Please stop Glipizide.  Start: - Novolog 8-10 mg 3x a day 15 min before the main meals  Please continue levothyroxine 175 mcg daily.  Take the thyroid hormone every day, with water, at least 30 minutes before breakfast, separated by at least 4 hours from: - acid reflux medications - calcium - iron - multivitamins  Please stop at the lab.  Please return in 3-4 months with your sugar log.   - we checked her HbA1c: 8.8% (slightly lower) - advised to check sugars at different times of the day - 3x a day, rotating check times - advised for yearly eye exams >> she is UTD - return to clinic in 3-4 months    2.  Papillary thyroid cancer -Reviewed previous thyroid cancer history per records from Dr. Ronnal[place] -Her thyroid cancer appears to be metastatic, as patient is status post 4 RAI tx's.  She also had increased signal on the PET scan from 2009, however, in 2010, another PET scan showed possible inflammatory lymph nodes in the area. -At last visit, her thyroglobulin levels were undetectable, at 0.7 before last visit and 0.3 at last visit.  Neck ultrasound showed a nonspecific calcified lesion in the right upper neck and we obtained a neck CT since last visit which showed stable calcified mass since 2009, most likely benign.  We reviewed these results together. -We will continue to follow this, will repeat thyroglobulin and ATA at next visit.  3.  Postsurgical hypothyroidism - latest thyroid labs reviewed with pt >> TSH suppressed 09/2018 -we decreased the dose of levothyroxine then - she continues on LT4 175 mcg daily - pt feels good on this dose. - we discussed about taking the thyroid hormone every day, with  water, >30 minutes before breakfast, separated by >4 hours from acid reflux medications, calcium, iron, multivitamins. Pt. is taking it correctly. - will check thyroid tests today: TSH and fT4 - If labs are abnormal, she will need to return for repeat TFTs in 1.5 months  Component     Latest Ref Rng & Units 12/28/2018  TSH     0.35 - 4.50 uIU/mL 0.04 (L)  T4,Free(Direct)     0.60 - 1.60 ng/dL 1.33   The TSH is still suppressed so need to decrease the dose of levothyroxine further, to 150 mcg  daily. We will repeat her TFTs in 1.5 months  Philemon Kingdom, MD PhD [facility] Endocrinology

## 2018-12-28 NOTE — Patient Instructions (Addendum)
Please continue: - Metformin 1000 mg 2x a day with meals.  Decrease: - Toujeo 30 units in a.m. and 40 units at bedtime  Please stop Glipizide.  Start: - Novolog 8-10 mg 3x a day 15 min before the main meals  Please continue levothyroxine 175 mcg daily.  Take the thyroid hormone every day, with water, at least 30 minutes before breakfast, separated by at least 4 hours from: - acid reflux medications - calcium - iron - multivitamins  Please stop at the lab.  Please return in 3-4 months with your sugar log.

## 2018-12-28 NOTE — Telephone Encounter (Signed)
Pt states nothing has been called into the pharmacy

## 2018-12-28 NOTE — Telephone Encounter (Signed)
Sent pls let her know

## 2018-12-28 NOTE — Telephone Encounter (Signed)
Left generic message letting patient know that medication has been sent in.

## 2018-12-28 NOTE — Progress Notes (Signed)
atorvastain

## 2018-12-28 NOTE — Telephone Encounter (Signed)
Patient is ok with being changed to Atorvastatin 40mg . Please call into the pharmacy.

## 2018-12-28 NOTE — Telephone Encounter (Signed)
Rx request sent to pharmacy.  

## 2018-12-29 LAB — T4, FREE: Free T4: 1.33 ng/dL (ref 0.60–1.60)

## 2018-12-29 LAB — TSH: TSH: 0.04 u[IU]/mL — ABNORMAL LOW (ref 0.35–4.50)

## 2018-12-29 MED ORDER — LEVOTHYROXINE SODIUM 150 MCG PO TABS: 150.0000 ug | ORAL_TABLET | Freq: Every day | ORAL | 3 refills | Status: DC

## 2019-01-01 ENCOUNTER — Telehealth: Payer: Self-pay

## 2019-01-01 NOTE — Telephone Encounter (Signed)
Notified patient of results and medication change, she expressed concern because when she was on the 200 MCG she felt great, plenty of energy.  As the dose has been lowering she has been feeling worse and less energy.

## 2019-01-01 NOTE — Telephone Encounter (Signed)
I understand, but unfortunately, it is not safe to stay with higher doses.  Her TSH was quite low even on the 175 mcg daily... High circulating thyroid hormones are associated with heart attacks, strokes, arrhythmia (atrial fibrillation, ventricular tachycardia).

## 2019-01-01 NOTE — Telephone Encounter (Signed)
-----   Message from Philemon Kingdom, MD sent at 12/29/2018  5:01 PM EDT ----- Lenna Sciara, can you please call pt: The TSH is still too low so we need to decrease the dose of levothyroxine further, to 150 mcg daily.  I sent this in. We will need to repeat her TFTs in 1.5 months.  Labs are in.

## 2019-01-02 NOTE — Telephone Encounter (Signed)
Notified patient of message from Dr. Gherghe, patient expressed understanding and agreement. No further questions.  

## 2019-01-17 ENCOUNTER — Telehealth: Payer: Self-pay

## 2019-01-17 NOTE — Telephone Encounter (Signed)
Patient has medicare, not sure if she would quality for patient assistance. Please advise.

## 2019-01-17 NOTE — Telephone Encounter (Signed)
Patient called in wanting to know if she could get number for reduce price on medicine Ohzimpic. She is having a hard time buying and getting her medicines and she has not got gotten her Humalog or needles.    Please call and advise  Pt will be back home after 3pm

## 2019-01-17 NOTE — Telephone Encounter (Signed)
Can we give her Ozempic - 3 pens to last her until next year when insurance will reset her cost, hopefully? Why did she not get the Humalog? Price? Should we change to another insulin?

## 2019-01-18 ENCOUNTER — Other Ambulatory Visit: Payer: Self-pay

## 2019-01-18 MED ORDER — INSULIN LISPRO (1 UNIT DIAL) 100 UNIT/ML (KWIKPEN): PEN_INJECTOR | SUBCUTANEOUS | 2 refills | Status: DC

## 2019-01-18 NOTE — Telephone Encounter (Signed)
Pt is already on Novolog, not Humalog. Would you still like to try Lispro first?

## 2019-01-18 NOTE — Telephone Encounter (Signed)
Spoke with the pharmacy tech at her local retail pharmacy and he stated that when he runs the claim, it is approved by insurance, but the copay just appears to be too expensive for the pt.

## 2019-01-18 NOTE — Telephone Encounter (Signed)
Rx for Lispro sent to pharmacy and pt called and notified.

## 2019-01-18 NOTE — Telephone Encounter (Signed)
Yes, let's try that.  I got some messages from the pharmacy to switch patients from Humalog brand-name to lispro generic because this is more affordable.  We can try it.

## 2019-01-18 NOTE — Telephone Encounter (Signed)
We can try Lispro or even Novolog - these may be cheaper. If not, may need regular insulin vials from [facility].Marland KitchenMarland Kitchen

## 2019-01-18 NOTE — Telephone Encounter (Signed)
Called pt and told her that there were 3 samples pens available for her to pick up to sustain her to the first of the new year. I will also call the pharmacy to get more information regarding the Humalog. Pt did not know if it was actually preferred and covered or not. All she was told by the pharmacy is that it would cost $180.

## 2019-01-19 ENCOUNTER — Telehealth: Payer: Self-pay | Admitting: *Deleted

## 2019-01-19 NOTE — Telephone Encounter (Signed)
Pt called and said ever since she has been on the atorvastatin she is having legs aching very hungry and insomnia. She wanted to know if this med needed to be changed or are these normal side effects

## 2019-01-19 NOTE — Telephone Encounter (Signed)
Spoke with patient and let her know that the leg pain is a common side effect. Encouraged her to give the medication a little more time as she has only been taking it for a couple of weeks. If she finds she still can't tolerate it to please call us back and we will see if it can be changed. She verbalized understanding.

## 2019-01-19 NOTE — Telephone Encounter (Signed)
I agree, thank you.

## 2019-01-23 ENCOUNTER — Other Ambulatory Visit: Payer: Self-pay

## 2019-01-23 ENCOUNTER — Encounter (HOSPITAL_COMMUNITY): Payer: Self-pay

## 2019-01-23 ENCOUNTER — Other Ambulatory Visit (HOSPITAL_COMMUNITY)
Admission: RE | Admit: 2019-01-23 | Discharge: 2019-01-23 | Disposition: A | Discharge status: Home / Self Care | Payer: Medicare Other | Source: Ambulatory Visit | Attending: Internal Medicine | Admitting: Internal Medicine

## 2019-01-23 ENCOUNTER — Encounter (HOSPITAL_COMMUNITY)
Admission: RE | Admit: 2019-01-23 | Discharge: 2019-01-23 | Disposition: A | Discharge status: Home / Self Care | Payer: Medicare Other | Source: Ambulatory Visit | Attending: Internal Medicine | Admitting: Internal Medicine

## 2019-01-23 DIAGNOSIS — Z20828 Contact with and (suspected) exposure to other viral communicable diseases: Secondary | ICD-10-CM | POA: Insufficient documentation

## 2019-01-23 DIAGNOSIS — Z01812 Encounter for preprocedural laboratory examination: Secondary | ICD-10-CM | POA: Diagnosis not present

## 2019-01-23 LAB — SARS CORONAVIRUS 2 (TAT 6-24 HRS): SARS Coronavirus 2: NEGATIVE

## 2019-01-23 NOTE — Patient Instructions (Signed)
Jennifer Blackburn  01/23/2019     @PREFPERIOPPHARMACY @   Your procedure is scheduled on  01/25/2019   Report to [email or web address]  Call this number if you have problems the morning of surgery:  [phone]   Remember:  Follow the diet and prep instructions given to you by Dr Roseanne Kaufman office.                       Take these medicines the morning of surgery with A SIP OF WATER  Gabapentin, isosorbide, levothyroxine, metoprolol, protonix, paxil. Take 1/2 of your usual insulin the night before your procedure. DO NOT take any medications for diabetes the morning of your procedure.    Do not wear jewelry, make-up or nail polish.  Do not wear lotions, powders, or perfumes. Please wear deodorant and brush your teeth.  Do not shave 48 hours prior to surgery.  Men may shave face and neck.  Do not bring valuables to the hospital.  [facility] is not responsible for any belongings or valuables.  Contacts, dentures or bridgework may not be worn into surgery.  Leave your suitcase in the car.  After surgery it may be brought to your room.  For patients admitted to the hospital, discharge time will be determined by your treatment team.  Patients discharged the day of surgery will not be allowed to drive home.   Name and phone number of your driver:   family Special instructions:  None  Please read over the following fact sheets that you were given. Anesthesia Post-op Instructions and Care and Recovery After Surgery       Upper Endoscopy, Adult, Care After This sheet gives you information about how to care for yourself after your procedure. Your health care provider may also give you more specific instructions. If you have problems or questions, contact your health care provider. What can I expect after the procedure? After the procedure, it is common to have:  A sore throat.  Mild stomach pain or discomfort.  Bloating.  Nausea. Follow these instructions at  home:   Follow instructions from your health care provider about what to eat or drink after your procedure.  Return to your normal activities as told by your health care provider. Ask your health care provider what activities are safe for you.  Take over-the-counter and prescription medicines only as told by your health care provider.  Do not drive for 24 hours if you were given a sedative during your procedure.  Keep all follow-up visits as told by your health care provider. This is important. Contact a health care provider if you have:  A sore throat that lasts longer than one day.  Trouble swallowing. Get help right away if:  You vomit blood or your vomit looks like coffee grounds.  You have: ? A fever. ? Bloody, black, or tarry stools. ? A severe sore throat or you cannot swallow. ? Difficulty breathing. ? Severe pain in your chest or abdomen. Summary  After the procedure, it is common to have a sore throat, mild stomach discomfort, bloating, and nausea.  Do not drive for 24 hours if you were given a sedative during the procedure.  Follow instructions from your health care provider about what to eat or drink after your procedure.  Return to your normal activities as told by your health care provider. This information is not intended to  replace advice given to you by your health care provider. Make sure you discuss any questions you have with your health care provider. Document Released: 09/07/2011 Document Revised: 08/30/2017 Document Reviewed: 08/08/2017 Elsevier Patient Education  2020 [employer].  Esophageal Dilatation Esophageal dilatation, also called esophageal dilation, is a procedure to widen or open (dilate) a blocked or narrowed part of the esophagus. The esophagus is the part of the body that moves food and liquid from the mouth to the stomach. You may need this procedure if:  You have a buildup of scar tissue in your esophagus that makes it difficult,  painful, or impossible to swallow. This can be caused by gastroesophageal reflux disease (GERD).  You have cancer of the esophagus.  There is a problem with how food moves through your esophagus. In some cases, you may need this procedure repeated at a later time to dilate the esophagus gradually. Tell a health care provider about:  Any allergies you have.  All medicines you are taking, including vitamins, herbs, eye drops, creams, and over-the-counter medicines.  Any problems you or family members have had with anesthetic medicines.  Any blood disorders you have.  Any surgeries you have had.  Any medical conditions you have.  Any antibiotic medicines you are required to take before dental procedures.  Whether you are pregnant or may be pregnant. What are the risks? Generally, this is a safe procedure. However, problems may occur, including:  Bleeding due to a tear in the lining of the esophagus.  A hole (perforation) in the esophagus. What happens before the procedure?  Follow instructions from your health care provider about eating or drinking restrictions.  Ask your health care provider about changing or stopping your regular medicines. This is especially important if you are taking diabetes medicines or blood thinners.  Plan to have someone take you home from the hospital or clinic.  Plan to have a responsible adult care for you for at least 24 hours after you leave the hospital or clinic. This is important. What happens during the procedure?  You may be given a medicine to help you relax (sedative).  A numbing medicine may be sprayed into the back of your throat, or you may gargle the medicine.  Your health care provider may perform the dilatation using various surgical instruments, such as: ? Simple dilators. This instrument is carefully placed in the esophagus to stretch it. ? Guided wire bougies. This involves using an endoscope to insert a wire into the  esophagus. A dilator is passed over this wire to enlarge the esophagus. Then the wire is removed. ? Balloon dilators. An endoscope with a small balloon at the end is inserted into the esophagus. The balloon is inflated to stretch the esophagus and open it up. The procedure may vary among health care providers and hospitals. What happens after the procedure?  Your blood pressure, heart rate, breathing rate, and blood oxygen level will be monitored until the medicines you were given have worn off.  Your throat may feel slightly sore and numb. This will improve slowly over time.  You will not be allowed to eat or drink until your throat is no longer numb.  When you are able to drink, urinate, and sit on the edge of the bed without nausea or dizziness, you may be able to return home. Follow these instructions at home:  Take over-the-counter and prescription medicines only as told by your health care provider.  Do not drive for 24 hours  if you were given a sedative during your procedure.  You should have a responsible adult with you for 24 hours after the procedure.  Follow instructions from your health care provider about any eating or drinking restrictions.  Do not use any products that contain nicotine or tobacco, such as cigarettes and e-cigarettes. If you need help quitting, ask your health care provider.  Keep all follow-up visits as told by your health care provider. This is important. Get help right away if you:  Have a fever.  Have chest pain.  Have pain that is not relieved by medication.  Have trouble breathing.  Have trouble swallowing.  Vomit blood. Summary  Esophageal dilatation, also called esophageal dilation, is a procedure to widen or open (dilate) a blocked or narrowed part of the esophagus.  Plan to have someone take you home from the hospital or clinic.  For this procedure, a numbing medicine may be sprayed into the back of your throat, or you may gargle  the medicine.  Do not drive for 24 hours if you were given a sedative during your procedure. This information is not intended to replace advice given to you by your health care provider. Make sure you discuss any questions you have with your health care provider. Document Released: 04/29/2005 Document Revised: 02/18/2017 Document Reviewed: 01/11/2017 Elsevier Patient Education  2020 [employer].  Colonoscopy, Adult, Care After This sheet gives you information about how to care for yourself after your procedure. Your health care provider may also give you more specific instructions. If you have problems or questions, contact your health care provider. What can I expect after the procedure? After the procedure, it is common to have:  A small amount of blood in your stool for 24 hours after the procedure.  Some gas.  Mild abdominal cramping or bloating. Follow these instructions at home: General instructions  For the first 24 hours after the procedure: ? Do not drive or use machinery. ? Do not sign important documents. ? Do not drink alcohol. ? Do your regular daily activities at a slower pace than normal. ? Eat soft, easy-to-digest foods.  Take over-the-counter or prescription medicines only as told by your health care provider. Relieving cramping and bloating   Try walking around when you have cramps or feel bloated.  Apply heat to your abdomen as told by your health care provider. Use a heat source that your health care provider recommends, such as a moist heat pack or a heating pad. ? Place a towel between your skin and the heat source. ? Leave the heat on for 20-30 minutes. ? Remove the heat if your skin turns bright red. This is especially important if you are unable to feel pain, heat, or cold. You may have a greater risk of getting burned. Eating and drinking   Drink enough fluid to keep your urine pale yellow.  Resume your normal diet as instructed by your health  care provider. Avoid heavy or fried foods that are hard to digest.  Avoid drinking alcohol for as long as instructed by your health care provider. Contact a health care provider if:  You have blood in your stool 2-3 days after the procedure. Get help right away if:  You have more than a small spotting of blood in your stool.  You pass large blood clots in your stool.  Your abdomen is swollen.  You have nausea or vomiting.  You have a fever.  You have increasing abdominal pain that is  not relieved with medicine. Summary  After the procedure, it is common to have a small amount of blood in your stool. You may also have mild abdominal cramping and bloating.  For the first 24 hours after the procedure, do not drive or use machinery, sign important documents, or drink alcohol.  Contact your health care provider if you have a lot of blood in your stool, nausea or vomiting, a fever, or increased abdominal pain. This information is not intended to replace advice given to you by your health care provider. Make sure you discuss any questions you have with your health care provider. Document Released: 10/21/2003 Document Revised: 12/29/2016 Document Reviewed: 05/20/2015 Elsevier Patient Education  2020 [employer] After These instructions provide you with information about caring for yourself after your procedure. Your health care provider may also give you more specific instructions. Your treatment has been planned according to current medical practices, but problems sometimes occur. Call your health care provider if you have any problems or questions after your procedure. What can I expect after the procedure? After your procedure, you may:  Feel sleepy for several hours.  Feel clumsy and have poor balance for several hours.  Feel forgetful about what happened after the procedure.  Have poor judgment for several hours.  Feel nauseous or vomit.   Have a sore throat if you had a breathing tube during the procedure. Follow these instructions at home: For at least 24 hours after the procedure:      Have a responsible adult stay with you. It is important to have someone help care for you until you are awake and alert.  Rest as needed.  Do not: ? Participate in activities in which you could fall or become injured. ? Drive. ? Use heavy machinery. ? Drink alcohol. ? Take sleeping pills or medicines that cause drowsiness. ? Make important decisions or sign legal documents. ? Take care of children on your own. Eating and drinking  Follow the diet that is recommended by your health care provider.  If you vomit, drink water, juice, or soup when you can drink without vomiting.  Make sure you have little or no nausea before eating solid foods. General instructions  Take over-the-counter and prescription medicines only as told by your health care provider.  If you have sleep apnea, surgery and certain medicines can increase your risk for breathing problems. Follow instructions from your health care provider about wearing your sleep device: ? Anytime you are sleeping, including during daytime naps. ? While taking prescription pain medicines, sleeping medicines, or medicines that make you drowsy.  If you smoke, do not smoke without supervision.  Keep all follow-up visits as told by your health care provider. This is important. Contact a health care provider if:  You keep feeling nauseous or you keep vomiting.  You feel light-headed.  You develop a rash.  You have a fever. Get help right away if:  You have trouble breathing. Summary  For several hours after your procedure, you may feel sleepy and have poor judgment.  Have a responsible adult stay with you for at least 24 hours or until you are awake and alert. This information is not intended to replace advice given to you by your health care provider. Make sure you  discuss any questions you have with your health care provider. Document Released: 06/29/2015 Document Revised: 06/06/2017 Document Reviewed: 06/29/2015 Elsevier Patient Education  2020 [employer].

## 2019-01-24 ENCOUNTER — Telehealth: Payer: Self-pay | Admitting: Internal Medicine

## 2019-01-24 NOTE — Telephone Encounter (Signed)
Called patient x 3 and line only rings once and then it stops

## 2019-01-24 NOTE — Telephone Encounter (Signed)
Spoke with pt. She just wanted to confirm her time she was to drink 2nd half of prep tomorrow. Nothing further needed

## 2019-01-24 NOTE — Telephone Encounter (Signed)
Called pt again and line rings once and then stops.

## 2019-01-24 NOTE — Telephone Encounter (Signed)
Pt has questions about the times on her instructions.      [phone]

## 2019-01-24 NOTE — Telephone Encounter (Signed)
Called pt again and line rings once and then d/c'd

## 2019-01-25 ENCOUNTER — Other Ambulatory Visit: Payer: Self-pay

## 2019-01-25 ENCOUNTER — Ambulatory Visit (HOSPITAL_COMMUNITY): Payer: Medicare Other | Admitting: Anesthesiology

## 2019-01-25 ENCOUNTER — Ambulatory Visit (HOSPITAL_COMMUNITY)
Admission: RE | Admit: 2019-01-25 | Discharge: 2019-01-25 | Disposition: A | Discharge status: Home / Self Care | Payer: Medicare Other | Attending: Internal Medicine | Admitting: Internal Medicine

## 2019-01-25 ENCOUNTER — Encounter (HOSPITAL_COMMUNITY): Payer: Self-pay | Admitting: Emergency Medicine

## 2019-01-25 ENCOUNTER — Encounter (HOSPITAL_COMMUNITY)
Admission: RE | Disposition: A | Discharge status: Home / Self Care | Payer: Self-pay | Source: Home / Self Care | Attending: Internal Medicine

## 2019-01-25 DIAGNOSIS — F419 Anxiety disorder, unspecified: Secondary | ICD-10-CM | POA: Diagnosis not present

## 2019-01-25 DIAGNOSIS — I2510 Atherosclerotic heart disease of native coronary artery without angina pectoris: Secondary | ICD-10-CM | POA: Insufficient documentation

## 2019-01-25 DIAGNOSIS — E785 Hyperlipidemia, unspecified: Secondary | ICD-10-CM | POA: Insufficient documentation

## 2019-01-25 DIAGNOSIS — Z09 Encounter for follow-up examination after completed treatment for conditions other than malignant neoplasm: Secondary | ICD-10-CM | POA: Diagnosis not present

## 2019-01-25 DIAGNOSIS — K5730 Diverticulosis of large intestine without perforation or abscess without bleeding: Secondary | ICD-10-CM | POA: Diagnosis not present

## 2019-01-25 DIAGNOSIS — Z794 Long term (current) use of insulin: Secondary | ICD-10-CM | POA: Diagnosis not present

## 2019-01-25 DIAGNOSIS — I10 Essential (primary) hypertension: Secondary | ICD-10-CM | POA: Insufficient documentation

## 2019-01-25 DIAGNOSIS — M47816 Spondylosis without myelopathy or radiculopathy, lumbar region: Secondary | ICD-10-CM | POA: Diagnosis not present

## 2019-01-25 DIAGNOSIS — E039 Hypothyroidism, unspecified: Secondary | ICD-10-CM | POA: Insufficient documentation

## 2019-01-25 DIAGNOSIS — Z79890 Hormone replacement therapy (postmenopausal): Secondary | ICD-10-CM | POA: Diagnosis not present

## 2019-01-25 DIAGNOSIS — Z85850 Personal history of malignant neoplasm of thyroid: Secondary | ICD-10-CM | POA: Diagnosis not present

## 2019-01-25 DIAGNOSIS — M1990 Unspecified osteoarthritis, unspecified site: Secondary | ICD-10-CM | POA: Diagnosis not present

## 2019-01-25 DIAGNOSIS — Z86010 Personal history of colonic polyps: Secondary | ICD-10-CM

## 2019-01-25 DIAGNOSIS — Z7722 Contact with and (suspected) exposure to environmental tobacco smoke (acute) (chronic): Secondary | ICD-10-CM | POA: Diagnosis not present

## 2019-01-25 DIAGNOSIS — E109 Type 1 diabetes mellitus without complications: Secondary | ICD-10-CM | POA: Diagnosis not present

## 2019-01-25 DIAGNOSIS — R12 Heartburn: Secondary | ICD-10-CM | POA: Insufficient documentation

## 2019-01-25 DIAGNOSIS — F329 Major depressive disorder, single episode, unspecified: Secondary | ICD-10-CM | POA: Insufficient documentation

## 2019-01-25 DIAGNOSIS — Z1211 Encounter for screening for malignant neoplasm of colon: Secondary | ICD-10-CM | POA: Insufficient documentation

## 2019-01-25 DIAGNOSIS — R1310 Dysphagia, unspecified: Secondary | ICD-10-CM | POA: Diagnosis not present

## 2019-01-25 DIAGNOSIS — Z8719 Personal history of other diseases of the digestive system: Secondary | ICD-10-CM | POA: Insufficient documentation

## 2019-01-25 DIAGNOSIS — Z7982 Long term (current) use of aspirin: Secondary | ICD-10-CM | POA: Diagnosis not present

## 2019-01-25 DIAGNOSIS — E7800 Pure hypercholesterolemia, unspecified: Secondary | ICD-10-CM | POA: Insufficient documentation

## 2019-01-25 DIAGNOSIS — K219 Gastro-esophageal reflux disease without esophagitis: Secondary | ICD-10-CM | POA: Diagnosis not present

## 2019-01-25 DIAGNOSIS — Z79899 Other long term (current) drug therapy: Secondary | ICD-10-CM | POA: Insufficient documentation

## 2019-01-25 DIAGNOSIS — K259 Gastric ulcer, unspecified as acute or chronic, without hemorrhage or perforation: Secondary | ICD-10-CM | POA: Diagnosis not present

## 2019-01-25 HISTORY — PX: ESOPHAGOGASTRODUODENOSCOPY (EGD) WITH PROPOFOL: SHX5813

## 2019-01-25 HISTORY — PX: COLONOSCOPY WITH PROPOFOL: SHX5780

## 2019-01-25 LAB — GLUCOSE, CAPILLARY
Glucose-Capillary: 138 mg/dL — ABNORMAL HIGH (ref 70–99)
Glucose-Capillary: 139 mg/dL — ABNORMAL HIGH (ref 70–99)
Glucose-Capillary: 115 mg/dL — ABNORMAL HIGH (ref 70–99)

## 2019-01-25 SURGERY — COLONOSCOPY WITH PROPOFOL
Anesthesia: General

## 2019-01-25 MED ORDER — LACTATED RINGERS IV SOLN
INTRAVENOUS | Status: DC | PRN
  Administered 2019-01-25 (×2): via INTRAVENOUS

## 2019-01-25 MED ORDER — KETAMINE HCL 10 MG/ML IJ SOLN
INTRAMUSCULAR | Status: DC | PRN
  Administered 2019-01-25: 10 mg via INTRAVENOUS

## 2019-01-25 MED ORDER — MIDAZOLAM HCL 2 MG/2ML IJ SOLN: 0.5000 mg | Freq: Once | INTRAMUSCULAR | Status: DC | PRN

## 2019-01-25 MED ORDER — HYDROMORPHONE HCL 1 MG/ML IJ SOLN: 0.2500 mg | INTRAMUSCULAR | Status: DC | PRN

## 2019-01-25 MED ORDER — PROMETHAZINE HCL 25 MG/ML IJ SOLN: 6.2500 mg | INTRAMUSCULAR | Status: DC | PRN

## 2019-01-25 MED ORDER — PROPOFOL 10 MG/ML IV BOLUS
INTRAVENOUS | Status: AC
  Filled 2019-01-25: qty 40

## 2019-01-25 MED ORDER — HYDROCODONE-ACETAMINOPHEN 7.5-325 MG PO TABS: 1.0000 | ORAL_TABLET | Freq: Once | ORAL | Status: DC | PRN

## 2019-01-25 MED ORDER — KETAMINE HCL 50 MG/5ML IJ SOSY
PREFILLED_SYRINGE | INTRAMUSCULAR | Status: AC
  Filled 2019-01-25: qty 5

## 2019-01-25 MED ORDER — LACTATED RINGERS IV SOLN: INTRAVENOUS | Status: DC

## 2019-01-25 MED ORDER — PROPOFOL 500 MG/50ML IV EMUL
INTRAVENOUS | Status: DC | PRN
  Administered 2019-01-25: 150 ug/kg/min via INTRAVENOUS

## 2019-01-25 MED ORDER — PROPOFOL 10 MG/ML IV BOLUS
INTRAVENOUS | Status: DC | PRN
  Administered 2019-01-25 (×2): 20 mg via INTRAVENOUS

## 2019-01-25 NOTE — Transfer of Care (Signed)
Immediate Anesthesia Transfer of Care Note  Patient: Jennifer Blackburn  Procedure(s) Performed: COLONOSCOPY WITH PROPOFOL (N/A ) ESOPHAGOGASTRODUODENOSCOPY (EGD) WITH PROPOFOL (N/A )  Patient Location: PACU  Anesthesia Type:General  Level of Consciousness: awake, alert , oriented and patient cooperative  Airway & Oxygen Therapy: Patient Spontanous Breathing  Post-op Assessment: Report given to RN and Post -op Vital signs reviewed and stable  Post vital signs: Reviewed and stable  Last Vitals:  Vitals Value Taken Time  BP    Temp    Pulse 66 01/25/19 1019  Resp 14 01/25/19 1019  SpO2 99 % 01/25/19 1019  Vitals shown include unvalidated device data.  Last Pain:  Vitals:   01/25/19 0804  TempSrc: Oral  PainSc: 0-No pain         Complications: No apparent anesthesia complications

## 2019-01-25 NOTE — Discharge Instructions (Signed)
Colonoscopy Discharge Instructions  Read the instructions outlined below and refer to this sheet in the next few weeks. These discharge instructions provide you with general information on caring for yourself after you leave the hospital. Your doctor may also give you specific instructions. While your treatment has been planned according to the most current medical practices available, unavoidable complications occasionally occur. If you have any problems or questions after discharge, call Dr. Gala Romney at [phone]. ACTIVITY  You may resume your regular activity, but move at a slower pace for the next 24 hours.   Take frequent rest periods for the next 24 hours.   Walking will help get rid of the air and reduce the bloated feeling in your belly (abdomen).   No driving for 24 hours (because of the medicine (anesthesia) used during the test).    Do not sign any important legal documents or operate any machinery for 24 hours (because of the anesthesia used during the test).  NUTRITION  Drink plenty of fluids.   You may resume your normal diet as instructed by your doctor.   Begin with a light meal and progress to your normal diet. Heavy or fried foods are harder to digest and may make you feel sick to your stomach (nauseated).   Avoid alcoholic beverages for 24 hours or as instructed.  MEDICATIONS  You may resume your normal medications unless your doctor tells you otherwise.  WHAT YOU CAN EXPECT TODAY  Some feelings of bloating in the abdomen.   Passage of more gas than usual.   Spotting of blood in your stool or on the toilet paper.  IF YOU HAD POLYPS REMOVED DURING THE COLONOSCOPY:  No aspirin products for 7 days or as instructed.   No alcohol for 7 days or as instructed.   Eat a soft diet for the next 24 hours.  FINDING OUT THE RESULTS OF YOUR TEST Not all test results are available during your visit. If your test results are not back during the visit, make an appointment  with your caregiver to find out the results. Do not assume everything is normal if you have not heard from your caregiver or the medical facility. It is important for you to follow up on all of your test results.  SEEK IMMEDIATE MEDICAL ATTENTION IF:  You have more than a spotting of blood in your stool.   Your belly is swollen (abdominal distention).   You are nauseated or vomiting.   You have a temperature over 101.   You have abdominal pain or discomfort that is severe or gets worse throughout the day.    EGD Discharge instructions Please read the instructions outlined below and refer to this sheet in the next few weeks. These discharge instructions provide you with general information on caring for yourself after you leave the hospital. Your doctor may also give you specific instructions. While your treatment has been planned according to the most current medical practices available, unavoidable complications occasionally occur. If you have any problems or questions after discharge, please call your doctor. ACTIVITY  You may resume your regular activity but move at a slower pace for the next 24 hours.   Take frequent rest periods for the next 24 hours.   Walking will help expel (get rid of) the air and reduce the bloated feeling in your abdomen.   No driving for 24 hours (because of the anesthesia (medicine) used during the test).   You may shower.   Do not sign  any important legal documents or operate any machinery for 24 hours (because of the anesthesia used during the test).  NUTRITION  Drink plenty of fluids.   You may resume your normal diet.   Begin with a light meal and progress to your normal diet.   Avoid alcoholic beverages for 24 hours or as instructed by your caregiver.  MEDICATIONS  You may resume your normal medications unless your caregiver tells you otherwise.  WHAT YOU CAN EXPECT TODAY  You may experience abdominal discomfort such as a feeling of  fullness or gas pains.  FOLLOW-UP  Your doctor will discuss the results of your test with you.  SEEK IMMEDIATE MEDICAL ATTENTION IF ANY OF THE FOLLOWING OCCUR:  Excessive nausea (feeling sick to your stomach) and/or vomiting.   Severe abdominal pain and distention (swelling).   Trouble swallowing.   Temperature over 101 F (37.8 C).   Rectal bleeding or vomiting of blood.   For the next 3 months, increase Protonix to 40 mg twice daily  GERD information provided  Peptic ulcer disease information provided  Diverticulosis information provided  I do not recommend future colonoscopy unless new symptoms develop  Further recommendations to follow once pathology report comes back for review  Office visit with [place] in 3 months  At patient request I called to Raford Pitcher, daughter at [phone] and reviewed impression and recommendations     Gastroesophageal Reflux Disease, Adult Gastroesophageal reflux (GER) happens when acid from the stomach flows up into the tube that connects the mouth and the stomach (esophagus). Normally, food travels down the esophagus and stays in the stomach to be digested. With GER, food and stomach acid sometimes move back up into the esophagus. You may have a disease called gastroesophageal reflux disease (GERD) if the reflux:  Happens often.  Causes frequent or very bad symptoms.  Causes problems such as damage to the esophagus. When this happens, the esophagus becomes sore and swollen (inflamed). Over time, GERD can make small holes (ulcers) in the lining of the esophagus. What are the causes? This condition is caused by a problem with the muscle between the esophagus and the stomach. When this muscle is weak or not normal, it does not close properly to keep food and acid from coming back up from the stomach. The muscle can be weak because of:  Tobacco use.  Pregnancy.  Having a certain type of hernia (hiatal hernia).  Alcohol use.  Certain foods  and drinks, such as coffee, chocolate, onions, and peppermint. What increases the risk? You are more likely to develop this condition if you:  Are overweight.  Have a disease that affects your connective tissue.  Use NSAID medicines. What are the signs or symptoms? Symptoms of this condition include:  Heartburn.  Difficult or painful swallowing.  The feeling of having a lump in the throat.  A bitter taste in the mouth.  Bad breath.  Having a lot of saliva.  Having an upset or bloated stomach.  Belching.  Chest pain. Different conditions can cause chest pain. Make sure you see your doctor if you have chest pain.  Shortness of breath or noisy breathing (wheezing).  Ongoing (chronic) cough or a cough at night.  Wearing away of the surface of teeth (tooth enamel).  Weight loss. How is this treated? Treatment will depend on how bad your symptoms are. Your doctor may suggest:  Changes to your diet.  Medicine.  Surgery. Follow these instructions at home: Eating and drinking  Follow a diet as told by your doctor. You may need to avoid foods and drinks such as: ? Coffee and tea (with or without caffeine). ? Drinks that contain alcohol. ? Energy drinks and sports drinks. ? Bubbly (carbonated) drinks or sodas. ? Chocolate and cocoa. ? Peppermint and mint flavorings. ? Garlic and onions. ? Horseradish. ? Spicy and acidic foods. These include peppers, chili powder, curry powder, vinegar, hot sauces, and BBQ sauce. ? Citrus fruit juices and citrus fruits, such as oranges, lemons, and limes. ? Tomato-based foods. These include red sauce, chili, salsa, and pizza with red sauce. ? Fried and fatty foods. These include donuts, french fries, potato chips, and high-fat dressings. ? High-fat meats. These include hot dogs, rib eye steak, sausage, ham, and bacon. ? High-fat dairy items, such as whole milk, butter, and cream cheese.  Eat small meals often. Avoid eating large  meals.  Avoid drinking large amounts of liquid with your meals.  Avoid eating meals during the 2-3 hours before bedtime.  Avoid lying down right after you eat.  Do not exercise right after you eat. Lifestyle   Do not use any products that contain nicotine or tobacco. These include cigarettes, e-cigarettes, and chewing tobacco. If you need help quitting, ask your doctor.  Try to lower your stress. If you need help doing this, ask your doctor.  If you are overweight, lose an amount of weight that is healthy for you. Ask your doctor about a safe weight loss goal. General instructions  Pay attention to any changes in your symptoms.  Take over-the-counter and prescription medicines only as told by your doctor. Do not take aspirin, ibuprofen, or other NSAIDs unless your doctor says it is okay.  Wear loose clothes. Do not wear anything tight around your waist.  Raise (elevate) the head of your bed about 6 inches (15 cm).  Avoid bending over if this makes your symptoms worse.  Keep all follow-up visits as told by your doctor. This is important. Contact a doctor if:  You have new symptoms.  You lose weight and you do not know why.  You have trouble swallowing or it hurts to swallow.  You have wheezing or a cough that keeps happening.  Your symptoms do not get better with treatment.  You have a hoarse voice. Get help right away if:  You have pain in your arms, neck, jaw, teeth, or back.  You feel sweaty, dizzy, or light-headed.  You have chest pain or shortness of breath.  You throw up (vomit) and your throw-up looks like blood or coffee grounds.  You pass out (faint).  Your poop (stool) is bloody or black.  You cannot swallow, drink, or eat. Summary  If a person has gastroesophageal reflux disease (GERD), food and stomach acid move back up into the esophagus and cause symptoms or problems such as damage to the esophagus.  Treatment will depend on how bad your  symptoms are.  Follow a diet as told by your doctor.  Take all medicines only as told by your doctor. This information is not intended to replace advice given to you by your health care provider. Make sure you discuss any questions you have with your health care provider. Document Released: 08/25/2007 Document Revised: 09/14/2017 Document Reviewed: 09/14/2017 Elsevier Patient Education  2020 [employer].     Peptic Ulcer  A peptic ulcer is a painful sore in the lining of your stomach or the first part of your small intestine. What are  the causes? Common causes of this condition include:  An infection.  Using certain pain medicines too often or too much. What increases the risk? You are more likely to get this condition if you:  Smoke.  Have a family history of ulcer disease.  Drink alcohol.  Have been hospitalized in an intensive care unit (ICU). What are the signs or symptoms? Symptoms include:  Burning pain in the area between the chest and the belly button. The pain may: ? Not go away (be persistent). ? Be worse when your stomach is empty. ? Be worse at night.  Heartburn.  Feeling sick to your stomach (nauseous) and throwing up (vomiting).  Bloating. If the ulcer results in bleeding, it can cause you to:  Have poop (stool) that is black and looks like tar.  Throw up bright red blood.  Throw up material that looks like coffee grounds. How is this treated? Treatment for this condition may include:  Stopping things that can cause the ulcer, such as: ? Smoking. ? Using pain medicines.  Medicines to reduce stomach acid.  Antibiotic medicines if the ulcer is caused by an infection.  A procedure that is done using a small, flexible tube that has a camera at the end (upper endoscopy). This may be done if you have a bleeding ulcer.  Surgery. This may be needed if: ? You have a lot of bleeding. ? The ulcer caused a hole somewhere in the digestive  system. Follow these instructions at home:  Do not drink alcohol if your doctor tells you not to drink.  Limit how much caffeine you take in.  Do not use any products that contain nicotine or tobacco, such as cigarettes, e-cigarettes, and chewing tobacco. If you need help quitting, ask your doctor.  Take over-the-counter and prescription medicines only as told by your doctor. ? Do not stop or change your medicines unless you talk with your doctor about it first. ? Do not take aspirin, ibuprofen, or other NSAIDs unless your doctor told you to do so.  Keep all follow-up visits as told by your doctor. This is important. Contact a doctor if:  You do not get better in 7 days after you start treatment.  You keep having an upset stomach (indigestion) or heartburn. Get help right away if:  You have sudden, sharp pain in your belly (abdomen).  You have belly pain that does not go away.  You have bloody poop (stool) or black, tarry poop.  You throw up blood. It may look like coffee grounds.  You feel light-headed or feel like you may pass out (faint).  You get weak.  You get sweaty or feel sticky and cold to the touch (clammy). Summary  Symptoms of a peptic ulcer include burning pain in the area between the chest and the belly button.  Take medicines only as told by your doctor.  Limit how much alcohol and caffeine you have.  Keep all follow-up visits as told by your doctor. This information is not intended to replace advice given to you by your health care provider. Make sure you discuss any questions you have with your health care provider. Document Released: 06/02/2009 Document Revised: 09/13/2017 Document Reviewed: 09/13/2017 Elsevier Patient Education  2020 [employer].    Diverticulosis  Diverticulosis is a condition that develops when small pouches (diverticula) form in the wall of the large intestine (colon). The colon is where water is absorbed and stool is formed.  The pouches form when the inside  layer of the colon pushes through weak spots in the outer layers of the colon. You may have a few pouches or many of them. What are the causes? The cause of this condition is not known. What increases the risk? The following factors may make you more likely to develop this condition:  Being older than age 30. Your risk for this condition increases with age. Diverticulosis is rare among people younger than age 4. By age 55, many people have it.  Eating a low-fiber diet.  Having frequent constipation.  Being overweight.  Not getting enough exercise.  Smoking.  Taking over-the-counter pain medicines, like aspirin and ibuprofen.  Having a family history of diverticulosis. What are the signs or symptoms? In most people, there are no symptoms of this condition. If you do have symptoms, they may include:  Bloating.  Cramps in the abdomen.  Constipation or diarrhea.  Pain in the lower left side of the abdomen. How is this diagnosed? This condition is most often diagnosed during an exam for other colon problems. Because diverticulosis usually has no symptoms, it often cannot be diagnosed independently. This condition may be diagnosed by:  Using a flexible scope to examine the colon (colonoscopy).  Taking an X-ray of the colon after dye has been put into the colon (barium enema).  Doing a CT scan. How is this treated? You may not need treatment for this condition if you have never developed an infection related to diverticulosis. If you have had an infection before, treatment may include:  Eating a high-fiber diet. This may include eating more fruits, vegetables, and grains.  Taking a fiber supplement.  Taking a live bacteria supplement (probiotic).  Taking medicine to relax your colon.  Taking antibiotic medicines. Follow these instructions at home:  Drink 6-8 glasses of water or more each day to prevent constipation.  Try not to strain  when you have a bowel movement.  If you have had an infection before: ? Eat more fiber as directed by your health care provider or your diet and nutrition specialist (dietitian). ? Take a fiber supplement or probiotic, if your health care provider approves.  Take over-the-counter and prescription medicines only as told by your health care provider.  If you were prescribed an antibiotic, take it as told by your health care provider. Do not stop taking the antibiotic even if you start to feel better.  Keep all follow-up visits as told by your health care provider. This is important. Contact a health care provider if:  You have pain in your abdomen.  You have bloating.  You have cramps.  You have not had a bowel movement in 3 days. Get help right away if:  Your pain gets worse.  Your bloating becomes very bad.  You have a fever or chills, and your symptoms suddenly get worse.  You vomit.  You have bowel movements that are bloody or black.  You have bleeding from your rectum. Summary  Diverticulosis is a condition that develops when small pouches (diverticula) form in the wall of the large intestine (colon).  You may have a few pouches or many of them.  This condition is most often diagnosed during an exam for other colon problems.  If you have had an infection related to diverticulosis, treatment may include increasing the fiber in your diet, taking supplements, or taking medicines. This information is not intended to replace advice given to you by your health care provider. Make sure you discuss any questions you  have with your health care provider. Document Released: 12/04/2003 Document Revised: 02/18/2017 Document Reviewed: 01/26/2016 Elsevier Patient Education  2020 [place] After These instructions provide you with information about caring for yourself after your procedure. Your health care provider may also give you more  specific instructions. Your treatment has been planned according to current medical practices, but problems sometimes occur. Call your health care provider if you have any problems or questions after your procedure. What can I expect after the procedure? After your procedure, you may:  Feel sleepy for several hours.  Feel clumsy and have poor balance for several hours.  Feel forgetful about what happened after the procedure.  Have poor judgment for several hours.  Feel nauseous or vomit.  Have a sore throat if you had a breathing tube during the procedure. Follow these instructions at home: For at least 24 hours after the procedure:      Have a responsible adult stay with you. It is important to have someone help care for you until you are awake and alert.  Rest as needed.  Do not: ? Participate in activities in which you could fall or become injured. ? Drive. ? Use heavy machinery. ? Drink alcohol. ? Take sleeping pills or medicines that cause drowsiness. ? Make important decisions or sign legal documents. ? Take care of children on your own. Eating and drinking  Follow the diet that is recommended by your health care provider.  If you vomit, drink water, juice, or soup when you can drink without vomiting.  Make sure you have little or no nausea before eating solid foods. General instructions  Take over-the-counter and prescription medicines only as told by your health care provider.  If you have sleep apnea, surgery and certain medicines can increase your risk for breathing problems. Follow instructions from your health care provider about wearing your sleep device: ? Anytime you are sleeping, including during daytime naps. ? While taking prescription pain medicines, sleeping medicines, or medicines that make you drowsy.  If you smoke, do not smoke without supervision.  Keep all follow-up visits as told by your health care provider. This is important. Contact a  health care provider if:  You keep feeling nauseous or you keep vomiting.  You feel light-headed.  You develop a rash.  You have a fever. Get help right away if:  You have trouble breathing. Summary  For several hours after your procedure, you may feel sleepy and have poor judgment.  Have a responsible adult stay with you for at least 24 hours or until you are awake and alert. This information is not intended to replace advice given to you by your health care provider. Make sure you discuss any questions you have with your health care provider. Document Released: 06/29/2015 Document Revised: 06/06/2017 Document Reviewed: 06/29/2015 Elsevier Patient Education  2020 [employer].

## 2019-01-25 NOTE — Anesthesia Preprocedure Evaluation (Signed)
Anesthesia Evaluation  Patient identified by MRN, date of birth, ID band Patient awake    Reviewed: Allergy & Precautions, NPO status , Patient's Chart, lab work & pertinent test results, reviewed documented beta blocker date and time   Airway Mallampati: II  TM Distance: >3 FB Neck ROM: Full    Dental no notable dental hx. (+) Teeth Intact   Pulmonary neg pulmonary ROS,    Pulmonary exam normal breath sounds clear to auscultation       Cardiovascular Exercise Tolerance: Good hypertension, Pt. on medications and Pt. on home beta blockers + CAD  Normal cardiovascular examI Rhythm:Regular Rate:Normal  Uses Imdur /Lopressor  Denies recent CP/MI or DOE   Neuro/Psych  Headaches, Anxiety Depression  Neuromuscular disease negative psych ROS   GI/Hepatic Neg liver ROS, GERD  Medicated and Controlled,  Endo/Other  diabetes, Type 1, Insulin Dependent, Oral Hypoglycemic AgentsHypothyroidism   Renal/GU negative Renal ROS  negative genitourinary   Musculoskeletal  (+) Arthritis , Osteoarthritis,    Abdominal   Peds negative pediatric ROS (+)  Hematology negative hematology ROS (+) anemia ,   Anesthesia Other Findings   Reproductive/Obstetrics negative OB ROS                             Anesthesia Physical Anesthesia Plan  ASA: III  Anesthesia Plan: General   Post-op Pain Management:    Induction: Intravenous  PONV Risk Score and Plan: 3 and TIVA, Propofol infusion, Ondansetron and Treatment may vary due to age or medical condition  Airway Management Planned: Nasal Cannula and Simple Face Mask  Additional Equipment:   Intra-op Plan:   Post-operative Plan:   Informed Consent: I have reviewed the patients History and Physical, chart, labs and discussed the procedure including the risks, benefits and alternatives for the proposed anesthesia with the patient or authorized representative who  has indicated his/her understanding and acceptance.     Dental advisory given  Plan Discussed with: CRNA  Anesthesia Plan Comments: (Plan Full PPE use  Plan GA with GETA as needed d/w pt -WTP with same after Q&A)        Anesthesia Quick Evaluation

## 2019-01-25 NOTE — Op Note (Signed)
[facility] Patient Name: Jennifer Blackburn Procedure Date: 01/25/2019 8:44 AM MRN: [id number] Date of Birth: 06/25/1945 Attending MD: Norvel Richards , MD CSN: [id number] Age: 73 Admit Type: Outpatient Procedure:                Upper GI endoscopy Indications:              Dysphagia, Heartburn Providers:                Norvel Richards, MD, Janeece Riggers, RN, Aram Candela Referring MD:              Medicines:                Propofol per Anesthesia Complications:            No immediate complications. Estimated Blood Loss:     Estimated blood loss was minimal. Procedure:                Pre-Anesthesia Assessment:                           - Prior to the procedure, a History and Physical                            was performed, and patient medications and                            allergies were reviewed. The patient's tolerance of                            previous anesthesia was also reviewed. The risks                            and benefits of the procedure and the sedation                            options and risks were discussed with the patient.                            All questions were answered, and informed consent                            was obtained. Prior Anticoagulants: The patient has                            taken no previous anticoagulant or antiplatelet                            agents. ASA Grade Assessment: II - A patient with                            mild systemic disease. After reviewing the risks  and benefits, the patient was deemed in                            satisfactory condition to undergo the procedure.                           After obtaining informed consent, the endoscope was                            passed under direct vision. Throughout the                            procedure, the patient's blood pressure, pulse, and                            oxygen saturations were  monitored continuously. The                            [id number] scope was introduced through the                            mouth, and advanced to the second part of duodenum.                            The upper GI endoscopy was accomplished without                            difficulty. The patient tolerated the procedure                            well. Scope In: 9:30:38 AM Scope Out: 9:47:10 AM Total Procedure Duration: 0 hours 16 minutes 32 seconds  Findings:      The examined esophagus was normal. I attempted to pass a 1 Maloney       dilator empirically but I could not get it beyond the hypopharynx. No       dilation performed.      One non-bleeding superficial gastric ulcer with no stigmata of bleeding       was found in the gastric antrum. The lesion was 10 mm in largest       dimension. This was biopsied with a cold forceps for histology.       Estimated blood loss was minimal. Estimated blood loss was minimal.      The exam was otherwise without abnormality.      The duodenal bulb and second portion of the duodenum were normal. Impression:               - Normal esophagus. No dilation performed                           - Non-bleeding gastric ulcer with no stigmata of                            bleeding. Biopsied.                           -  The examination was otherwise normal.                           - Normal duodenal bulb and second portion of the                            duodenum.                           - Moderate Sedation:      Moderate (conscious) sedation was administered by the endoscopy nurse       and supervised by the endoscopist. The following parameters were       monitored: oxygen saturation, heart rate, blood pressure, respiratory       rate, EKG, adequacy of pulmonary ventilation, and response to care. Recommendation:           - Patient has a contact number available for                            emergencies. The signs and symptoms of  potential                            delayed complications were discussed with the                            patient. Return to normal activities tomorrow.                            Written discharge instructions were provided to the                            patient.                           - Advance diet as tolerated.                           - Continue present medications. Increase Protonix                            to 40 mg twice daily for 3 months follow-up                            biopsies. Avoid NSAID agents                           - Return to my office in 3 months. Procedure Code(s):        --- Professional ---                           [phone], Esophagogastroduodenoscopy, flexible,                            transoral; with biopsy, single or multiple Diagnosis Code(s):        --- Professional ---  K25.9, Gastric ulcer, unspecified as acute or                            chronic, without hemorrhage or perforation                           R13.10, Dysphagia, unspecified                           R12, Heartburn CPT copyright 2019 American Medical Association. All rights reserved. The codes documented in this report are preliminary and upon coder review may  be revised to meet current compliance requirements. Cristopher Estimable. Cliff Damiani, MD Norvel Richards, MD 01/25/2019 [id number] AM This report has been signed electronically. Number of Addenda: 0

## 2019-01-25 NOTE — Anesthesia Procedure Notes (Signed)
Procedure Name: General with mask airway Performed by: Ollen Bowl, CRNA Pre-anesthesia Checklist: Timeout performed, Patient being monitored, Suction available, Emergency Drugs available and Patient identified Oxygen Delivery Method: Non-rebreather mask

## 2019-01-25 NOTE — H&P (Signed)
@LOGO @   Primary Care Physician:  Fayrene Helper, MD Primary Gastroenterologist:  Dr. Gala Romney  Pre-Procedure History & Physical: HPI:  Jennifer Blackburn is a 73 y.o. female here for further evaluation of GERD/esophageal dysphagia and surveillance colonoscopy given history of colonic adenomas removed previously.  Past Medical History:  Diagnosis Date  . Anxiety   . CAD (coronary artery disease)   . Depression   . Diabetes mellitus type II    without complication  . DJD (degenerative joint disease) of lumbar spine   . Hypercholesteremia   . Hyperlipidemia   . Hypertension    benign   . Thyroid cancer ([place]) 2001    Past Surgical History:  Procedure Laterality Date  . ABDOMINAL HYSTERECTOMY    . CARDIAC CATHETERIZATION    . CATARACT EXTRACTION W/PHACO Right 07/17/2012   Procedure: CATARACT EXTRACTION PHACO AND INTRAOCULAR LENS PLACEMENT (IOC);  Surgeon: Tonny Branch, MD;  Location: [facility]S;  Service: Ophthalmology;  Laterality: Right;  CDE:25.51  . CATARACT EXTRACTION W/PHACO Left 09/17/2016   Procedure: CATARACT EXTRACTION PHACO AND INTRAOCULAR LENS PLACEMENT LEFT EYE;  Surgeon: Tonny Branch, MD;  Location: [facility] ORS;  Service: Ophthalmology;  Laterality: Left;  CDE: 19.23  . COLONOSCOPY    . COLONOSCOPY N/A 01/15/2014   Procedure: COLONOSCOPY;  Surgeon: Daneil Dolin, MD;  Location: [facility] SUITE;  Service: Endoscopy;  Laterality: N/A;  9:00 AM  . DOPPLER ECHOCARDIOGRAPHY    . JOINT REPLACEMENT  07/01/2010   left hip  . KNEE SURGERY Right    arthroscopy  . left hip replaced  07/01/2010  . SPINE SURGERY  2006   cervical  . stress dipyridamole myocardial perfusion    . THYROIDECTOMY    . TONSILLECTOMY    . VESICOVAGINAL FISTULA CLOSURE W/ TAH      Prior to Admission medications   Medication Sig Start Date End Date Taking? Authorizing Provider  acetaminophen (TYLENOL) 500 MG tablet Take 1,000 mg by mouth every 6 (six) hours as needed (for pain.).   Yes [provider]   aspirin EC 81 MG tablet Take 1 tablet (81 mg total) by mouth daily. 10/27/15  Yes Penumalli, Earlean Polka, MD  atorvastatin (LIPITOR) 40 MG tablet Take 1 tablet (40 mg total) by mouth daily. Patient taking differently: Take 40 mg by mouth every evening.  12/28/18  Yes Fayrene Helper, MD  Cholecalciferol (VITAMIN D3) 50 MCG (2000 UT) TABS Take 2,000 Units by mouth daily.   Yes [provider]  gabapentin (NEURONTIN) 100 MG capsule TAKE 1 CAPSULE(100 MG) BY MOUTH THREE TIMES DAILY Patient taking differently: Take 100 mg by mouth See admin instructions. Take 1 capsule (100 mg) by mouth scheduled in the morning, then take 1 capsule (100 mg) by mouth twice daily if needed for pain. 06/05/18  Yes Fayrene Helper, MD  imipramine (TOFRANIL) 25 MG tablet TAKE 4 TABLETS BY MOUTH EVERY NIGHT AT BEDTIME Patient taking differently: Take 50-100 mg by mouth at bedtime.  06/26/18  Yes Fayrene Helper, MD  Insulin Glargine, 1 Unit Dial, (TOUJEO SOLOSTAR) 300 UNIT/ML SOPN Inject 30-50 Units into the skin 2 (two) times daily. Patient taking differently: Inject 30-50 Units into the skin See admin instructions. Inject 30 units subcutaneously in the morning & inject 50 units subcutaneously in the evening. 12/06/18  Yes Philemon Kingdom, MD  isosorbide mononitrate (IMDUR) 30 MG 24 hr tablet TAKE HALF A TABLET BY MOUTH EVERY DAY Patient taking differently: Take 15 mg by mouth daily.  12/28/18  Yes Lorretta Harp, MD  levothyroxine (SYNTHROID) 150 MCG tablet Take 1 tablet (150 mcg total) by mouth daily. Patient taking differently: Take 150 mcg by mouth daily before breakfast.  12/29/18  Yes Philemon Kingdom, MD  loratadine (CLARITIN) 10 MG tablet Take 10 mg by mouth daily.    Yes [provider]  Menthol, Topical Analgesic, (ICY HOT EX) Apply 1 application topically 3 (three) times daily as needed (for back pain.).   Yes [provider]  metFORMIN (GLUCOPHAGE) 1000 MG tablet TAKE 1 TABLET  TWICE A DAY Patient taking differently: Take 1,000 mg by mouth 2 (two) times daily.  06/23/18  Yes Fayrene Helper, MD  metoprolol tartrate (LOPRESSOR) 50 MG tablet Take 1 tablet (50 mg total) by mouth 2 (two) times daily. 05/10/18  Yes Lorretta Harp, MD  pantoprazole (PROTONIX) 40 MG tablet Take 1 tablet (40 mg total) by mouth daily. 11/29/18  Yes Fayrene Helper, MD  PARoxetine (PAXIL) 20 MG tablet Take 1 tablet (20 mg total) by mouth daily. Patient reports that she takes it 1-2 times week. Patient taking differently: Take 20 mg by mouth daily.  05/01/18  Yes Fayrene Helper, MD  quinapril (ACCUPRIL) 10 MG tablet TAKE 1 TABLET DAILY 10/30/18  Yes Fayrene Helper, MD  verapamil (CALAN-SR) 180 MG CR tablet TAKE 1 TABLET(180 MG) BY MOUTH AT BEDTIME Patient taking differently: Take 180 mg by mouth at bedtime.  06/19/18  Yes Lorretta Harp, MD  vitamin B-12 (CYANOCOBALAMIN) 1000 MCG tablet Take 1 tablet (1,000 mcg total) by mouth daily. 08/20/14  Yes Fayrene Helper, MD  dicyclomine (BENTYL) 10 MG capsule Take 1 capsule (10 mg total) by mouth 4 (four) times daily -  before meals and at bedtime. 11/08/18   Erenest Rasher, PA-C  ferrous sulfate 325 (65 FE) MG tablet Take 325 mg by mouth daily at 12 noon.     [provider]  fluticasone (FLONASE) 50 MCG/ACT nasal spray Place 2 sprays into both nostrils daily. Patient taking differently: Place 2 sprays into both nostrils daily as needed for allergies.  12/07/18   Perlie Mayo, NP  glucose blood (ACCU-CHEK AVIVA PLUS) test strip Use as instructed two times daily dx E11.65 11/22/18   Fayrene Helper, MD  insulin lispro (HUMALOG) 100 UNIT/ML KwikPen Inject 8-10 units under the skin three times daily before meals. Please give Lispro, not name brand. 01/18/19   Philemon Kingdom, MD  Insulin Pen Needle 32G X 4 MM MISC Use 4x a day 12/28/18   Philemon Kingdom, MD  Lancets (ACCU-CHEK MULTICLIX) lancets Use as instructed three  times daily dx 250.01 05/07/13   Fayrene Helper, MD  polyethylene glycol-electrolytes (TRILYTE) 420 g solution Take 4,000 mLs by mouth as directed. 11/20/18   Daneil Dolin, MD  pravastatin (PRAVACHOL) 80 MG tablet TAKE 1 TABLET DAILY 06/23/18   Fayrene Helper, MD  simethicone (PHAZYME) 125 MG chewable tablet Chew 1 tablet (125 mg total) by mouth every 6 (six) hours as needed for flatulence. 11/05/18   Fayrene Helper, MD    Allergies as of 11/20/2018 - Review Complete 11/08/2018  Allergen Reaction Noted  . Daypro [oxaprozin] Hives 08/23/2016  . Sulfonamide derivatives Hives 04/14/2007    Family History  Problem Relation Age of Onset  . Heart attack Father   . Heart failure Mother   . Asthma Daughter   . Sleep apnea Son  CPAP  . Colon cancer Neg Hx     Social History   Socioeconomic History  . Marital status: Married    Spouse name: Not on file  . Number of children: 2  . Years of education: 26  . Highest education level: Not on file  Occupational History  . Occupation: retired     Comment: Company secretary  . Financial resource strain: Not hard at all  . Food insecurity    Worry: Never true    Inability: Never true  . Transportation needs    Medical: No    Non-medical: No  Tobacco Use  . Smoking status: Passive Smoke Exposure - Never Smoker  . Smokeless tobacco: Never Used  . Tobacco comment: Husband smokes in the home  Substance and Sexual Activity  . Alcohol use: No  . Drug use: No  . Sexual activity: Never  Lifestyle  . Physical activity    Days per week: 5 days    Minutes per session: 40 min  . Stress: Not at all  Relationships  . Social connections    Talks on phone: More than three times a week    Gets together: Three times a week    Attends religious service: More than 4 times per year    Active member of club or organization: Yes    Attends meetings of clubs or organizations: More than 4 times per year    Relationship status:  Married  . Intimate partner violence    Fear of current or ex partner: No    Emotionally abused: No    Physically abused: No    Forced sexual activity: No  Other Topics Concern  . Not on file  Social History Narrative   Lives with husband, at home   No caffeine    Review of Systems: See HPI, otherwise negative ROS  Physical Exam: BP 137/68   Pulse 70   Temp 98.7 F (37.1 C) (Oral)   Resp 16   LMP 11/11/2016   SpO2 95%  General:   Alert,  Well-developed, well-nourished, pleasant and cooperative in NAD Neck:  Supple; no masses or thyromegaly. No significant cervical adenopathy. Lungs:  Clear throughout to auscultation.   No wheezes, crackles, or rhonchi. No acute distress. Heart:  Regular rate and rhythm; no murmurs, clicks, rubs,  or gallops. Abdomen: Non-distended, normal bowel sounds.  Soft and nontender without appreciable mass or hepatosplenomegaly.  Pulses:  Normal pulses noted. Extremities:  Without clubbing or edema.  Impression/Plan: 73 year old lady with longstanding GERD and now solid food dysphagia here for further evaluation via EGD.  History of multiple colonic adenomas removed previously.  Here for surveillance colonoscopy as well.     Notice: This dictation was prepared with Dragon dictation along with smaller phrase technology. Any transcriptional errors that result from this process are unintentional and may not be corrected upon review.

## 2019-01-25 NOTE — Op Note (Signed)
[facility] Patient Name: Jennifer Blackburn Procedure Date: 01/25/2019 8:38 AM MRN: [id number] Date of Birth: 21-Oct-1945 Attending MD: Norvel Richards , MD CSN: [id number] Age: 73 Admit Type: Outpatient Procedure:                Colonoscopy Indications:              High risk colon cancer surveillance: Personal                            history of colonic polyps Providers:                Norvel Richards, MD, Janeece Riggers, RN, Aram Candela Referring MD:              Medicines:                Propofol per Anesthesia Complications:            No immediate complications. Estimated Blood Loss:     Estimated blood loss: none. Procedure:                Pre-Anesthesia Assessment:                           - Prior to the procedure, a History and Physical                            was performed, and patient medications and                            allergies were reviewed. The patient's tolerance of                            previous anesthesia was also reviewed. The risks                            and benefits of the procedure and the sedation                            options and risks were discussed with the patient.                            All questions were answered, and informed consent                            was obtained. Prior Anticoagulants: The patient has                            taken no previous anticoagulant or antiplatelet                            agents. ASA Grade Assessment: III - A patient with  severe systemic disease. After reviewing the risks                            and benefits, the patient was deemed in                            satisfactory condition to undergo the procedure.                           After obtaining informed consent, the colonoscope                            was passed under direct vision. Throughout the                            procedure, the patient's blood  pressure, pulse, and                            oxygen saturations were monitored continuously. The                            [id number]) scope was introduced through                            the anus and advanced to the the cecum, identified                            by appendiceal orifice and ileocecal valve. The                            colonoscopy was performed without difficulty. The                            patient tolerated the procedure well. The quality                            of the bowel preparation was adequate. Scope In: 9:52:17 AM Scope Out: 10:10:14 AM Scope Withdrawal Time: 0 hours 8 minutes 1 second  Total Procedure Duration: 0 hours 17 minutes 57 seconds  Findings:      The perianal and digital rectal examinations were normal.      Scattered small and large-mouthed diverticula were found in the entire       colon.      The exam was otherwise without abnormality on direct and retroflexion       views. Impression:               - Diverticulosis in the entire examined colon.                           - The examination was otherwise normal on direct                            and retroflexion views.                           -  No specimens collected. Moderate Sedation:      Moderate (conscious) sedation was personally administered by an       anesthesia professional. The following parameters were monitored: oxygen       saturation, heart rate, blood pressure, respiratory rate, EKG, adequacy       of pulmonary ventilation, and response to care. Recommendation:           - Patient has a contact number available for                            emergencies. The signs and symptoms of potential                            delayed complications were discussed with the                            patient. Return to normal activities tomorrow.                            Written discharge instructions were provided to the                            patient.                            - Advance diet as tolerated.                           - Continue present medications.                           - No repeat colonoscopy due to age.                           - Return to GI clinic in 3 months. See EGD report Procedure Code(s):        --- Professional ---                           [phone], Colonoscopy, flexible; diagnostic, including                            collection of specimen(s) by brushing or washing,                            when performed (separate procedure) Diagnosis Code(s):        --- Professional ---                           Z86.010, Personal history of colonic polyps                           K57.30, Diverticulosis of large intestine without                            perforation or abscess without bleeding CPT copyright 2019 American Medical Association. All rights reserved. The codes  documented in this report are preliminary and upon coder review may  be revised to meet current compliance requirements. Cristopher Estimable. Spyridon Hornstein, MD Norvel Richards, MD 01/25/2019 10:29:34 AM This report has been signed electronically. Number of Addenda: 0

## 2019-01-25 NOTE — Anesthesia Postprocedure Evaluation (Signed)
Anesthesia Post Note  Patient: Jennifer Blackburn  Procedure(s) Performed: COLONOSCOPY WITH PROPOFOL (N/A ) ESOPHAGOGASTRODUODENOSCOPY (EGD) WITH PROPOFOL (N/A )  Patient location during evaluation: PACU Anesthesia Type: General Level of consciousness: awake and alert, oriented and patient cooperative Pain management: pain level controlled Vital Signs Assessment: post-procedure vital signs reviewed and stable Respiratory status: spontaneous breathing Cardiovascular status: stable Postop Assessment: no apparent nausea or vomiting Anesthetic complications: no     Last Vitals:  Vitals:   01/25/19 0804  BP: 137/68  Pulse: 70  Resp: 16  Temp: 37.1 C  SpO2: 95%    Last Pain:  Vitals:   01/25/19 0804  TempSrc: Oral  PainSc: 0-No pain                 ADAMS, AMY A

## 2019-01-26 LAB — SURGICAL PATHOLOGY

## 2019-01-26 NOTE — Progress Notes (Signed)
Pt called to inform us that she had a swollen bottom lip & a canker sore on both her top & bottom lip. Pt denied any other facial swelling, tongue swelling or difficulty breathing. Pt said that she had been putting canker sore medicine on her lips.  Instructed pt that it sounds as if the trauma came from the bite block that was used during her procedure. Instructed her that if the swelling or sore was to get any worse to let us & her physican know ASAP.

## 2019-01-27 ENCOUNTER — Encounter: Payer: Self-pay | Admitting: Internal Medicine

## 2019-01-29 ENCOUNTER — Telehealth: Payer: Self-pay

## 2019-01-29 ENCOUNTER — Encounter (HOSPITAL_COMMUNITY): Payer: Self-pay | Admitting: Internal Medicine

## 2019-01-29 NOTE — Telephone Encounter (Signed)
Per RMR- Send letter to patient.  Send copy of letter with path to referring provider and PCP.  Should have an office visit in 3 months. Will need a repeat EGD at that time.

## 2019-02-01 NOTE — Telephone Encounter (Signed)
OV made °

## 2019-02-07 ENCOUNTER — Ambulatory Visit (INDEPENDENT_AMBULATORY_CARE_PROVIDER_SITE_OTHER): Payer: Medicare Other | Admitting: Family Medicine

## 2019-02-07 ENCOUNTER — Encounter: Payer: Self-pay | Admitting: Family Medicine

## 2019-02-07 ENCOUNTER — Other Ambulatory Visit: Payer: Self-pay

## 2019-02-07 VITALS — BP 110/68 | Ht 64.5 in | Wt 187.0 lb

## 2019-02-07 DIAGNOSIS — I2510 Atherosclerotic heart disease of native coronary artery without angina pectoris: Secondary | ICD-10-CM | POA: Diagnosis not present

## 2019-02-07 DIAGNOSIS — E1159 Type 2 diabetes mellitus with other circulatory complications: Secondary | ICD-10-CM | POA: Diagnosis not present

## 2019-02-07 DIAGNOSIS — I10 Essential (primary) hypertension: Secondary | ICD-10-CM

## 2019-02-07 DIAGNOSIS — R5383 Other fatigue: Secondary | ICD-10-CM | POA: Diagnosis not present

## 2019-02-07 DIAGNOSIS — R5382 Chronic fatigue, unspecified: Secondary | ICD-10-CM | POA: Insufficient documentation

## 2019-02-07 DIAGNOSIS — M791 Myalgia, unspecified site: Secondary | ICD-10-CM | POA: Diagnosis not present

## 2019-02-07 DIAGNOSIS — E890 Postprocedural hypothyroidism: Secondary | ICD-10-CM | POA: Diagnosis not present

## 2019-02-07 DIAGNOSIS — I2583 Coronary atherosclerosis due to lipid rich plaque: Secondary | ICD-10-CM

## 2019-02-07 DIAGNOSIS — E782 Mixed hyperlipidemia: Secondary | ICD-10-CM | POA: Diagnosis not present

## 2019-02-07 NOTE — Progress Notes (Signed)
Virtual Visit via Telephone Note  I connected with Antoine Primas on 02/07/19 at  1:20 PM EST by telephone and verified that I am speaking with the correct person using two identifiers.  Location: Patient: home Provider: office   I discussed the limitations, risks, security and privacy concerns of performing an evaluation and management service by telephone and the availability of in person appointments. I also discussed with the patient that there may be a patient responsible charge related to this service. The patient expressed understanding and agreed to proceed.   History of Present Illness: C/o increased muscle aches since changing to ATORVASTATIN , WHICH IS WORSENING AND MAKING HER FEEL LIKE SHE  SHOULD NOT GET OUT OF BED. HAD RECENT CHANGE IN THYROID MEDICATION ALSO AND IS WONDERING IF THIS IS ALSO CONTRIBUTING TO NEW [place] bLOOD SUGAR IS IMPROVING THOUGH STILL NOIT AT GOAL. DENIES INCREASED AND UNCONTROLLED DEPRESSION OR  ANXIETY Denies recent fever or chills. Denies sinus pressure, nasal congestion, ear pain or sore throat. Denies chest congestion, productive cough or wheezing. Denies chest pains, palpitations and leg swelling Denies abdominal pain, nausea, vomiting,diarrhea or constipation.   Denies dysuria, frequency, hesitancy or incontinence. Denies depression, anxiety or insomnia. Denies skin break down or rash.       Observations/Objective: BP 110/68   Ht 5' 4.5" (1.638 m)   Wt 187 lb (84.8 kg)   LMP 11/11/2016   BMI 31.60 kg/m   Good communication with no confusion and intact memory. Alert and oriented x 3 No signs of respiratory distress during speech    Assessment and Plan: Hyperlipidemia NOT AT GOL AND INTOLERANT OF ATOVASTATIN. Hold statin for 1 week, then resume pravastatin as before Follow low fat diet Hyperlipidemia:Low fat diet discussed and encouraged.   Lipid Panel  Lab Results  Component Value Date   CHOL 189 12/21/2018   HDL 42 (L)  12/21/2018   LDLCALC 117 (H) 12/21/2018   TRIG 179 (H) 12/21/2018   CHOLHDL 4.5 12/21/2018   Not at goal  Updated lab needed at/ before next visit.   Muscle pain Myalgia noted with new stati, d/c andresume prior statin if tolerated, ifnot callin forlabs to check muscle enzymes  Hypertension Controlled, no change in medication DASH diet and commitment to daily physical activity for a minimum of 30 minutes discussed and encouraged, as a part of hypertension management. The importance of attaining a healthy weight is also discussed.  BP/Weight 02/07/2019 01/25/2019 12/28/2018 12/18/2018 12/07/2018 [id number]  Systolic BP A999333 91 A999333 123456 [id number]  Diastolic BP 68 58 60 70 70 76 70  Wt. (Lbs) 187 - 187 186 182 184 185  BMI 31.6 - 31.6 30.95 30.29 35.94 31.26          Postsurgical hypothyroidism Treated by Endo, question adverse response to current replacment  Type 2 diabetes mellitus with vascular disease ([place]) Ms. Langsam is reminded of the importance of commitment to daily physical activity for 30 minutes or more, as able and the need to limit carbohydrate intake to 30 to 60 grams per meal to help with blood sugar control.   The need to take medication as prescribed, test blood sugar as directed, and to call between visits if there is a concern that blood sugar is uncontrolled is also discussed.   Ms. Hassan is reminded of the importance of daily foot exam, annual eye examination, and good blood sugar, blood pressure and cholesterol control. Uncontrolled currently working at correcting this , managed by  Endo  Diabetic Labs Latest Ref Rng & Units 12/28/2018 12/21/2018 09/21/2018 05/26/2018 02/07/2018  HbA1c 4.0 - 5.6 % 8.8(A) - 8.9(A) 8.4(A) 8.0(A)  Microalbumin mg/dL - 1.5 - - -  Micro/Creat Ratio 0.0 - 30.0 mg/g creat - - - - -  Chol <200 mg/dL - 189 - - -  HDL > OR = 50 mg/dL - 42(L) - - -  Calc LDL mg/dL (calc) - 117(H) - - -  Triglycerides <150 mg/dL - 179(H) - - -   Creatinine 0.60 - 0.93 mg/dL - 0.76 - - -   BP/Weight 02/07/2019 01/25/2019 12/28/2018 12/18/2018 12/07/2018 [id number]  Systolic BP A999333 91 A999333 123456 123456 [id number]  Diastolic BP 68 58 60 70 70 76 70  Wt. (Lbs) 187 - 187 186 182 184 185  BMI 31.6 - 31.6 30.95 30.29 35.94 31.26   Foot/eye exam completion dates Latest Ref Rng & Units 05/24/2018 01/10/2018  Eye Exam No Retinopathy No Retinopathy -  Foot exam Order - - -  Foot Form Completion - - Done          Follow Up Instructions:    I discussed the assessment and treatment plan with the patient. The patient was provided an opportunity to ask questions and all were answered. The patient agreed with the plan and demonstrated an understanding of the instructions.   The patient was advised to call back or seek an in-person evaluation if the symptoms worsen or if the condition fails to improve as anticipated.  I provided 25 minutes of non-face-to-face time during this encounter.   Tula Nakayama, MD

## 2019-02-07 NOTE — Patient Instructions (Addendum)
F/u in March as before, caLL IF YOU NEED ME SOONER  STOP ATORVASTATIN DUE TO ACHES  RESUME PRAVACHOL 80 MG , WHICH YOU HAVE TOLERATED IN THE PAST IN 1 WEEK. IF YOU AGAIN START HAVING ACHES STOP AND CALL IN  PLEASE DISCUSS YOUR FATIGUE AND CONCERN RE THYROID MEDICATION DOSE WITH YOUR ENDOCRINOLOGIST, AS WE DISCUSSED  PLEASE GET FASTING LABS ORDERED 1 WEEK  BEFORE YOUR MARCH  FOLLOW UP  PLEASE FOLLOW LO W FAT DIET  Thanks for choosing [facility], we consider it a privelige to serve you.

## 2019-02-08 ENCOUNTER — Other Ambulatory Visit: Payer: Self-pay | Admitting: Family Medicine

## 2019-02-12 ENCOUNTER — Encounter: Payer: Self-pay | Admitting: Family Medicine

## 2019-02-12 DIAGNOSIS — L700 Acne vulgaris: Secondary | ICD-10-CM | POA: Diagnosis not present

## 2019-02-12 DIAGNOSIS — L680 Hirsutism: Secondary | ICD-10-CM | POA: Diagnosis not present

## 2019-02-12 NOTE — Assessment & Plan Note (Signed)
Jennifer Blackburn is reminded of the importance of commitment to daily physical activity for 30 minutes or more, as able and the need to limit carbohydrate intake to 30 to 60 grams per meal to help with blood sugar control.   The need to take medication as prescribed, test blood sugar as directed, and to call between visits if there is a concern that blood sugar is uncontrolled is also discussed.   Jennifer Blackburn is reminded of the importance of daily foot exam, annual eye examination, and good blood sugar, blood pressure and cholesterol control. Uncontrolled currently working at correcting this , managed by Endo  Diabetic Labs Latest Ref Rng & Units 12/28/2018 12/21/2018 09/21/2018 05/26/2018 02/07/2018  HbA1c 4.0 - 5.6 % 8.8(A) - 8.9(A) 8.4(A) 8.0(A)  Microalbumin mg/dL - 1.5 - - -  Micro/Creat Ratio 0.0 - 30.0 mg/g creat - - - - -  Chol <200 mg/dL - 189 - - -  HDL > OR = 50 mg/dL - 42(L) - - -  Calc LDL mg/dL (calc) - 117(H) - - -  Triglycerides <150 mg/dL - 179(H) - - -  Creatinine 0.60 - 0.93 mg/dL - 0.76 - - -   BP/Weight 02/07/2019 01/25/2019 12/28/2018 12/18/2018 12/07/2018 [id number]  Systolic BP A999333 91 A999333 123456 123456 [id number]  Diastolic BP 68 58 60 70 70 76 70  Wt. (Lbs) 187 - 187 186 182 184 185  BMI 31.6 - 31.6 30.95 30.29 35.94 31.26   Foot/eye exam completion dates Latest Ref Rng & Units 05/24/2018 01/10/2018  Eye Exam No Retinopathy No Retinopathy -  Foot exam Order - - -  Foot Form Completion - - Done

## 2019-02-12 NOTE — Assessment & Plan Note (Signed)
NOT AT GOL AND INTOLERANT OF ATOVASTATIN. Hold statin for 1 week, then resume pravastatin as before Follow low fat diet Hyperlipidemia:Low fat diet discussed and encouraged.   Lipid Panel  Lab Results  Component Value Date   CHOL 189 12/21/2018   HDL 42 (L) 12/21/2018   LDLCALC 117 (H) 12/21/2018   TRIG 179 (H) 12/21/2018   CHOLHDL 4.5 12/21/2018   Not at goal  Updated lab needed at/ before next visit.

## 2019-02-12 NOTE — Assessment & Plan Note (Addendum)
Treated by Endo, question adverse response to current replacment

## 2019-02-12 NOTE — Assessment & Plan Note (Signed)
Myalgia noted with new stati, d/c andresume prior statin if tolerated, ifnot callin forlabs to check muscle enzymes

## 2019-02-12 NOTE — Assessment & Plan Note (Addendum)
Controlled, no change in medication DASH diet and commitment to daily physical activity for a minimum of 30 minutes discussed and encouraged, as a part of hypertension management. The importance of attaining a healthy weight is also discussed.  BP/Weight 02/07/2019 01/25/2019 12/28/2018 12/18/2018 12/07/2018 [id number]  Systolic BP A999333 91 A999333 123456 123456 [id number]  Diastolic BP 68 58 60 70 70 76 70  Wt. (Lbs) 187 - 187 186 182 184 185  BMI 31.6 - 31.6 30.95 30.29 35.94 31.26

## 2019-02-27 DIAGNOSIS — L851 Acquired keratosis [keratoderma] palmaris et plantaris: Secondary | ICD-10-CM | POA: Diagnosis not present

## 2019-02-27 DIAGNOSIS — M2040 Other hammer toe(s) (acquired), unspecified foot: Secondary | ICD-10-CM | POA: Diagnosis not present

## 2019-02-27 DIAGNOSIS — E1142 Type 2 diabetes mellitus with diabetic polyneuropathy: Secondary | ICD-10-CM | POA: Diagnosis not present

## 2019-03-08 ENCOUNTER — Other Ambulatory Visit (HOSPITAL_COMMUNITY): Payer: Self-pay | Admitting: Family Medicine

## 2019-03-08 DIAGNOSIS — Z1231 Encounter for screening mammogram for malignant neoplasm of breast: Secondary | ICD-10-CM

## 2019-03-22 ENCOUNTER — Ambulatory Visit (HOSPITAL_COMMUNITY)
Admission: RE | Admit: 2019-03-22 | Discharge: 2019-03-22 | Disposition: A | Discharge status: Home / Self Care | Payer: Medicare Other | Source: Ambulatory Visit | Attending: Family Medicine | Admitting: Family Medicine

## 2019-03-22 ENCOUNTER — Other Ambulatory Visit: Payer: Self-pay

## 2019-03-22 DIAGNOSIS — Z1231 Encounter for screening mammogram for malignant neoplasm of breast: Secondary | ICD-10-CM | POA: Insufficient documentation

## 2019-04-03 ENCOUNTER — Other Ambulatory Visit: Payer: Self-pay

## 2019-04-03 ENCOUNTER — Encounter: Payer: Self-pay | Admitting: Family Medicine

## 2019-04-03 ENCOUNTER — Ambulatory Visit (INDEPENDENT_AMBULATORY_CARE_PROVIDER_SITE_OTHER): Payer: Medicare HMO | Admitting: Family Medicine

## 2019-04-03 VITALS — BP 128/78 | HR 74 | Temp 97.8°F | Ht 65.0 in | Wt 190.0 lb

## 2019-04-03 DIAGNOSIS — G4452 New daily persistent headache (NDPH): Secondary | ICD-10-CM

## 2019-04-03 DIAGNOSIS — E1159 Type 2 diabetes mellitus with other circulatory complications: Secondary | ICD-10-CM | POA: Diagnosis not present

## 2019-04-03 DIAGNOSIS — E669 Obesity, unspecified: Secondary | ICD-10-CM

## 2019-04-03 DIAGNOSIS — G44329 Chronic post-traumatic headache, not intractable: Secondary | ICD-10-CM | POA: Diagnosis not present

## 2019-04-03 DIAGNOSIS — S0990XA Unspecified injury of head, initial encounter: Secondary | ICD-10-CM | POA: Diagnosis not present

## 2019-04-03 DIAGNOSIS — M65341 Trigger finger, right ring finger: Secondary | ICD-10-CM | POA: Diagnosis not present

## 2019-04-03 DIAGNOSIS — G44029 Chronic cluster headache, not intractable: Secondary | ICD-10-CM

## 2019-04-03 DIAGNOSIS — K219 Gastro-esophageal reflux disease without esophagitis: Secondary | ICD-10-CM

## 2019-04-03 DIAGNOSIS — E66811 Obesity, class 1: Secondary | ICD-10-CM

## 2019-04-03 DIAGNOSIS — I10 Essential (primary) hypertension: Secondary | ICD-10-CM | POA: Diagnosis not present

## 2019-04-03 NOTE — Patient Instructions (Addendum)
F/U as before, call if you need me sooner  You have normal neuro exam, and are referred for  Brain scan to r/o bleed  You are referred to Dr Amedeo Plenty re right trigger finger  Thanks for choosing [facility], we consider it a privelige to serve you.   Best for 2021!

## 2019-04-07 ENCOUNTER — Encounter: Payer: Self-pay | Admitting: Family Medicine

## 2019-04-07 NOTE — Assessment & Plan Note (Signed)
New daily headache s/p occipital trauma 9 days ago. Needs head cT scan to further evaluate

## 2019-04-07 NOTE — Assessment & Plan Note (Signed)
Jennifer Blackburn is reminded of the importance of commitment to daily physical activity for 30 minutes or more, as able and the need to limit carbohydrate intake to 30 to 60 grams per meal to help with blood sugar control.   The need to take medication as prescribed, test blood sugar as directed, and to call between visits if there is a concern that blood sugar is uncontrolled is also discussed.   Jennifer Blackburn is reminded of the importance of daily foot exam, annual eye examination, and good blood sugar, blood pressure and cholesterol control.  Diabetic Labs Latest Ref Rng & Units 12/28/2018 12/21/2018 09/21/2018 05/26/2018 02/07/2018  HbA1c 4.0 - 5.6 % 8.8(A) - 8.9(A) 8.4(A) 8.0(A)  Microalbumin mg/dL - 1.5 - - -  Micro/Creat Ratio 0.0 - 30.0 mg/g creat - - - - -  Chol <200 mg/dL - 189 - - -  HDL > OR = 50 mg/dL - 42(L) - - -  Calc LDL mg/dL (calc) - 117(H) - - -  Triglycerides <150 mg/dL - 179(H) - - -  Creatinine 0.60 - 0.93 mg/dL - 0.76 - - -   BP/Weight 04/03/2019 02/07/2019 01/25/2019 12/28/2018 12/18/2018 12/07/2018 123456  Systolic BP 0000000 A999333 91 A999333 123456 123456 [id number]  Diastolic BP 78 68 58 60 70 70 76  Wt. (Lbs) 190 187 - 187 186 182 184  BMI 31.62 31.6 - 31.6 30.95 30.29 35.94   Foot/eye exam completion dates Latest Ref Rng & Units 05/24/2018 01/10/2018  Eye Exam No Retinopathy No Retinopathy -  Foot exam Order - - -  Foot Form Completion - - Done   Uncontrolled, managed by Endo

## 2019-04-07 NOTE — Assessment & Plan Note (Signed)
Occip[ital traum with 9 day h/o daily d headache post trauma.  Needs head CT scan

## 2019-04-07 NOTE — Assessment & Plan Note (Signed)
Controlled, no change in medication  

## 2019-04-07 NOTE — Assessment & Plan Note (Signed)
Controlled, no change in medication DASH diet and commitment to daily physical activity for a minimum of 30 minutes discussed and encouraged, as a part of hypertension management. The importance of attaining a healthy weight is also discussed.  BP/Weight 04/03/2019 02/07/2019 01/25/2019 12/28/2018 12/18/2018 12/07/2018 123456  Systolic BP 0000000 A999333 91 A999333 123456 123456 [id number]  Diastolic BP 78 68 58 60 70 70 76  Wt. (Lbs) 190 187 - 187 186 182 184  BMI 31.62 31.6 - 31.6 30.95 30.29 35.94

## 2019-04-07 NOTE — Progress Notes (Signed)
Jennifer Blackburn     MRN: [id number]      DOB: [birth year]   HPI Jennifer Blackburn is here with c/o  Blunt head trauma as she fell backward on cement 9 sdyas ago and she continues to have new headache and soreness following the event. Denies LOC, bleeding or bruising, denies memory loss Blood sugar still elevated and uncontrolled , but improving, medication cost is a big issue  ROS Denies recent fever or chills. Denies sinus pressure, nasal congestion, ear pain or sore throat. Denies chest congestion, productive cough or wheezing. Denies chest pains, palpitations and leg swelling Denies abdominal pain, nausea, vomiting,diarrhea or constipation.   Denies dysuria, frequency, hesitancy or incontinence. C/o  jright ring trigger finger, wants help. Denies depression, uncontrolled  anxiety or insomnia. Denies skin break down or rash.   PE  BP 128/78   Pulse 74   Temp 97.8 F (36.6 C) (Temporal)   Ht 5\' 5"  (1.651 m)   Wt 190 lb (86.2 kg)   LMP 11/11/2016   SpO2 97%   BMI 31.62 kg/m   Patient alert and oriented and in no cardiopulmonary distress.  HEENT: No facial asymmetry, EOMI,     Neck decreasd rOM.  Chest: Clear to auscultation bilaterally.  CVS: S1, S2 no murmurs, no S3.Regular rate.  .   Ext: No edema  MS: decreased  ROM spine, shoulders, hips and knees.Right ring finger triggered  Skin: Intact, no ulcerations or rash noted.  Psych: Good eye contact, normal affect. Memory intact not anxious or depressed appearing.  CNS: CN 2-12 intact, power,  normal throughout.no focal deficits noted.   Assessment & Plan  New persistent daily headache New daily headache s/p occipital trauma 9 days ago. Needs head cT scan to further evaluate  Head trauma Occip[ital traum with 9 day h/o daily d headache post trauma.  Needs head CT scan  Trigger finger, right ring finger Increasingly disabling, refer to Orthopedics  Type 2 diabetes mellitus with vascular disease ([place]) [name] is reminded of the importance of commitment to daily physical activity for 30 minutes or more, as able and the need to limit carbohydrate intake to 30 to 60 grams per meal to help with blood sugar control.   The need to take medication as prescribed, test blood sugar as directed, and to call between visits if there is a concern that blood sugar is uncontrolled is also discussed.   Jennifer Blackburn is reminded of the importance of daily foot exam, annual eye examination, and good blood sugar, blood pressure and cholesterol control.  Diabetic Labs Latest Ref Rng & Units 12/28/2018 12/21/2018 09/21/2018 05/26/2018 02/07/2018  HbA1c 4.0 - 5.6 % 8.8(A) - 8.9(A) 8.4(A) 8.0(A)  Microalbumin mg/dL - 1.5 - - -  Micro/Creat Ratio 0.0 - 30.0 mg/g creat - - - - -  Chol <200 mg/dL - 189 - - -  HDL > OR = 50 mg/dL - 42(L) - - -  Calc LDL mg/dL (calc) - 117(H) - - -  Triglycerides <150 mg/dL - 179(H) - - -  Creatinine 0.60 - 0.93 mg/dL - 0.76 - - -   BP/Weight 04/03/2019 02/07/2019 01/25/2019 12/28/2018 12/18/2018 12/07/2018 123456  Systolic BP 0000000 A999333 91 A999333 123456 123456 [id number]  Diastolic BP 78 68 58 60 70 70 76  Wt. (Lbs) 190 187 - 187 186 182 184  BMI 31.62 31.6 - 31.6 30.95 30.29 35.94   Foot/eye exam completion dates Latest Ref Rng & Units 05/24/2018 01/10/2018  Eye Exam No Retinopathy No Retinopathy -  Foot exam Order - - -  Foot Form Completion - - Done   Uncontrolled, managed by Endo     Hypertension Controlled, no change in medication DASH diet and commitment to daily physical activity for a minimum of 30 minutes discussed and encouraged, as a part of hypertension management. The importance of attaining a healthy weight is also discussed.  BP/Weight 04/03/2019 02/07/2019 01/25/2019 12/28/2018 12/18/2018 12/07/2018 123456  Systolic BP 0000000 A999333 91 A999333 123456 123456 [id number]  Diastolic BP 78 68 58 60 70 70 76  Wt. (Lbs) 190 187 - 187 186 182 184  BMI 31.62 31.6 - 31.6 30.95 30.29 35.94       GERD  (gastroesophageal reflux disease) Controlled, no change in medication   Obesity (BMI 30.0-34.9)  Patient re-educated about  the importance of commitment to a  minimum of 150 minutes of exercise per week as able.  The importance of healthy food choices with portion control discussed, as well as eating regularly and within a 12 hour window most days. The need to choose "clean , green" food 50 to 75% of the time is discussed, as well as to make water the primary drink and set a goal of 64 ounces water daily.    Weight /BMI 04/03/2019 02/07/2019 12/28/2018  WEIGHT 190 lb 187 lb 187 lb  HEIGHT 5\' 5"  5' 4.5" 5' 4.5"  BMI 31.62 kg/m2 31.6 kg/m2 31.6 kg/m2

## 2019-04-07 NOTE — Assessment & Plan Note (Signed)
Increasingly disabling, refer to Orthopedics

## 2019-04-07 NOTE — Assessment & Plan Note (Signed)
  Patient re-educated about  the importance of commitment to a  minimum of 150 minutes of exercise per week as able.  The importance of healthy food choices with portion control discussed, as well as eating regularly and within a 12 hour window most days. The need to choose "clean , green" food 50 to 75% of the time is discussed, as well as to make water the primary drink and set a goal of 64 ounces water daily.    Weight /BMI 04/03/2019 02/07/2019 12/28/2018  WEIGHT 190 lb 187 lb 187 lb  HEIGHT 5\' 5"  5' 4.5" 5' 4.5"  BMI 31.62 kg/m2 31.6 kg/m2 31.6 kg/m2

## 2019-04-12 ENCOUNTER — Ambulatory Visit (HOSPITAL_COMMUNITY)
Admission: RE | Admit: 2019-04-12 | Discharge: 2019-04-12 | Disposition: A | Discharge status: Home / Self Care | Payer: Medicare HMO | Source: Ambulatory Visit | Attending: Family Medicine | Admitting: Family Medicine

## 2019-04-12 ENCOUNTER — Other Ambulatory Visit: Payer: Self-pay

## 2019-04-12 ENCOUNTER — Encounter: Payer: Self-pay | Admitting: Family Medicine

## 2019-04-12 ENCOUNTER — Other Ambulatory Visit: Payer: Self-pay | Admitting: Family Medicine

## 2019-04-12 ENCOUNTER — Ambulatory Visit (INDEPENDENT_AMBULATORY_CARE_PROVIDER_SITE_OTHER): Payer: Medicare HMO | Admitting: Family Medicine

## 2019-04-12 VITALS — BP 128/78 | Ht 65.0 in | Wt 190.0 lb

## 2019-04-12 DIAGNOSIS — M79661 Pain in right lower leg: Secondary | ICD-10-CM

## 2019-04-12 DIAGNOSIS — M25562 Pain in left knee: Secondary | ICD-10-CM

## 2019-04-12 DIAGNOSIS — M25561 Pain in right knee: Secondary | ICD-10-CM | POA: Diagnosis not present

## 2019-04-12 DIAGNOSIS — M79604 Pain in right leg: Secondary | ICD-10-CM | POA: Diagnosis not present

## 2019-04-12 DIAGNOSIS — M79605 Pain in left leg: Secondary | ICD-10-CM

## 2019-04-12 DIAGNOSIS — M79662 Pain in left lower leg: Secondary | ICD-10-CM | POA: Diagnosis not present

## 2019-04-12 NOTE — Patient Instructions (Signed)
F/u as before, call if you neeed me sooner  You  are referred for imaging study of your legs to ensure you have no clot causing your pain, we will call with your appointment requested for today  If no clot, I will refer you to [facility] for evaluation of your knee

## 2019-04-12 NOTE — Assessment & Plan Note (Addendum)
1 week . Worsening , needs doppler study, unable to stretch out leg, if negative will need urgent Ortho eval

## 2019-04-13 ENCOUNTER — Ambulatory Visit: Payer: Self-pay

## 2019-04-13 ENCOUNTER — Encounter: Payer: Self-pay | Admitting: Orthopaedic Surgery

## 2019-04-13 ENCOUNTER — Ambulatory Visit (INDEPENDENT_AMBULATORY_CARE_PROVIDER_SITE_OTHER): Payer: Medicare HMO

## 2019-04-13 ENCOUNTER — Ambulatory Visit: Payer: Medicare HMO | Admitting: Orthopaedic Surgery

## 2019-04-13 DIAGNOSIS — M170 Bilateral primary osteoarthritis of knee: Secondary | ICD-10-CM

## 2019-04-13 DIAGNOSIS — M1712 Unilateral primary osteoarthritis, left knee: Secondary | ICD-10-CM

## 2019-04-13 DIAGNOSIS — M1711 Unilateral primary osteoarthritis, right knee: Secondary | ICD-10-CM

## 2019-04-13 MED ORDER — LIDOCAINE HCL 1 % IJ SOLN
2.0000 mL | INTRAMUSCULAR | Status: AC | PRN
  Administered 2019-04-13: 12:00:00 2 mL

## 2019-04-13 MED ORDER — METHYLPREDNISOLONE ACETATE 40 MG/ML IJ SUSP
40.0000 mg | INTRAMUSCULAR | Status: AC | PRN
  Administered 2019-04-13: 40 mg via INTRA_ARTICULAR

## 2019-04-13 MED ORDER — BUPIVACAINE HCL 0.25 % IJ SOLN
2.0000 mL | INTRAMUSCULAR | Status: AC | PRN
  Administered 2019-04-13: 12:00:00 2 mL via INTRA_ARTICULAR

## 2019-04-13 NOTE — Assessment & Plan Note (Signed)
Severe , pt unable to stretch or bend knee , possible Baker's cyst, will need urgent Ortho eval if doppler study negative , based on her amount of debility

## 2019-04-13 NOTE — Progress Notes (Signed)
Virtual Visit via Telephone Note  I connected with Jennifer Blackburn on 04/13/19 at 10:00 AM EST by telephone and verified that I am speaking with the correct person using two identifiers.  Location: Patient: home Provider:office   I discussed the limitations, risks, security and privacy concerns of performing an evaluation and management service by telephone and the availability of in person appointments. I also discussed with the patient that there may be a patient responsible charge related to this service. The patient expressed understanding and agreed to proceed.   History of Present Illness: C/o worsening and uncontrolled bilateral leg pain, right worse than left , pain extends from foot , through ankle and knee, unable to bend the knee , has difficulty even sitting Denies cough, or shortness of breath   Observations/Objective: BP 128/78   Ht 5\' 5"  (1.651 m)   Wt 190 lb (86.2 kg)   LMP 11/11/2016   BMI 31.62 kg/m  Good communication with no confusion and intact memory. Alert and oriented x 3 No signs of respiratory distress during speech    Assessment and Plan: Bilateral calf pain 1 week . Worsening , needs doppler study, unable to stretch out leg, if negative will need urgent Ortho eval  Posterior right knee pain Severe , pt unable to stretch or bend knee , possible Baker's cyst, will need urgent Ortho eval if doppler study negative , based on her amount of debility   Follow Up Instructions:    I discussed the assessment and treatment plan with the patient. The patient was provided an opportunity to ask questions and all were answered. The patient agreed with the plan and demonstrated an understanding of the instructions.   The patient was advised to call back or seek an in-person evaluation if the symptoms worsen or if the condition fails to improve as anticipated.  I provided 15 minutes of non-face-to-face time during this encounter.   Tula Nakayama, MD

## 2019-04-13 NOTE — Progress Notes (Signed)
Office Visit Note   Patient: Jennifer Blackburn           Date of Birth: 07-26-1945           MRN: [id number] Visit Date: 04/13/2019              Requested by: Fayrene Helper, MD [address], [place],  [place] [id number] PCP: Fayrene Helper, MD   Assessment & Plan: Visit Diagnoses:  1. Bilateral primary osteoarthritis of knee     Plan: Impression is advanced generative joint disease both knees with acute exacerbation to the right knee.  We will inject this with cortisone today.  She will advance with activity as tolerated.  She will follow-up with [place] as needed.  Follow-Up Instructions: Return if symptoms worsen or fail to improve.   Orders:  Orders Placed This Encounter  Procedures  . Large Joint Inj: R knee  . XR KNEE 3 VIEW LEFT  . XR KNEE 3 VIEW RIGHT   No orders of the defined types were placed in this encounter.     Procedures: Large Joint Inj: R knee on 04/13/2019 11:47 AM Indications: pain Details: 22 G needle, anterolateral approach Medications: 2 mL lidocaine 1 %; 2 mL bupivacaine 0.25 %; 40 mg methylPREDNISolone acetate 40 MG/ML      Clinical Data: No additional findings.   Subjective: Chief Complaint  Patient presents with  . Left Knee - Pain  . Right Knee - Pain    HPI patient is a pleasant 74 year old female who comes in today for bilateral knee pain right greater than left.  She fell on Sunday, 03/25/2019 landing backwards onto her head and back.  Since then she has had pain to her whole body to include both knees right greater than left.  She does actually note that her left knee has dramatically improved.  The pain she has to the right knee is to the entire aspect worse posterior medially.  The pain is aggravated when she is trying to stand or walk.  She also has pain with flexion or extension of the knee.  He has been taking Tylenol Extra Strength with moderate relief of symptoms.  She denies any previous knee pain, cortisone  injection or surgical intervention to either knee.  She is status post left total hip replacement 2015.  Doing well with that.  She denies any pain to the groin or thighs today.  Review of Systems as detailed in HPI.  All others reviewed and are negative.   Objective: Vital Signs: LMP 11/11/2016   Physical Exam well-developed well-nourished female no acute distress.  Alert and oriented x3.  Ortho Exam examination of both knees reveals no effusion.  Right knee range of motion 0 to 90 degrees.  Medial joint line tenderness.  Calf soft nontender.  Ligaments are stable.  Left knee exam shows no joint line tenderness.  Range of motion 0 to 120 degrees.  Ligaments stable.  She is neurovascular intact distally.  Specialty Comments:  No specialty comments available.  Imaging: US Venous Img Lower Bilateral  Result Date: 04/12/2019 CLINICAL DATA:  Pain x2 weeks EXAM: BILATERAL LOWER EXTREMITY VENOUS DOPPLER ULTRASOUND TECHNIQUE: Gray-scale sonography with compression, as well as color and duplex ultrasound, were performed to evaluate the deep venous system(s) from the level of the common femoral vein through the popliteal and proximal calf veins. COMPARISON:  12/27/2006 FINDINGS: VENOUS Normal compressibility of the common femoral, superficial femoral, and popliteal veins, as well  as the visualized calf veins. Visualized portions of profunda femoral vein and great saphenous vein unremarkable. No filling defects to suggest DVT on grayscale or color Doppler imaging. Doppler waveforms show normal direction of venous flow, normal respiratory phasicity and response to augmentation. OTHER None. Limitations: none IMPRESSION: No femoropopliteal DVT nor evidence of DVT within the visualized calf veins. If clinical symptoms are inconsistent or if there are persistent or worsening symptoms, further imaging (possibly involving the iliac veins) may be warranted. Electronically Signed   By: Lucrezia Europe M.D.   On:  04/12/2019 14:46   XR KNEE 3 VIEW LEFT  Result Date: 04/13/2019 Advanced tricompartmental degenerative changes without acute findings   XR KNEE 3 VIEW RIGHT  Result Date: 04/13/2019 Advanced tricompartmental degenerative changes without acute findings    PMFS History: Patient Active Problem List   Diagnosis Date Noted  . Bilateral calf pain 04/12/2019  . New persistent daily headache 04/03/2019  . Trigger finger, right ring finger 04/03/2019  . Muscle pain 02/07/2019  . Fatigue 02/07/2019  . Dysphagia 11/11/2018  . History of colonic polyps 11/11/2018  . Obesity (BMI 30.0-34.9) 11/05/2018  . Cervical spondylosis with radiculopathy 06/20/2017  . Hypertension   . Hyperlipidemia   . Depression   . CAD (coronary artery disease)   . Anxiety   . Osteoarthritis 02/17/2017  . Family history of coronary artery disease in father 02/17/2017  . Lumbar spondylosis with myelopathy 07/18/2016  . Laryngopharyngeal reflux (LPR) 06/24/2016  . Type 2 diabetes mellitus with vascular disease ([place]) 01/13/2015  . GERD (gastroesophageal reflux disease) 04/11/2014  . At high risk for falls 12/31/2013  . Postsurgical hypothyroidism 01/05/2013  . Snoring 01/05/2013  . Anxiety and depression 10/16/2011  . Anemia 03/30/2011  . Posterior right knee pain 01/24/2011  . Head trauma 01/22/2011  . Vitamin D deficiency 10/14/2010  . Bilateral carotid bruits 10/13/2010  . Allergic rhinitis 11/23/2007  . Dyslipidemia 04/14/2007  . CARPAL TUNNEL SYNDROME, BILATERAL 04/14/2007  . Coronary atherosclerosis 04/14/2007  . Headache 04/14/2007  . Thyroid cancer ([place]) 03/23/1999   Past Medical History:  Diagnosis Date  . Anxiety   . CAD (coronary artery disease)   . Depression   . Diabetes mellitus type II    without complication  . DJD (degenerative joint disease) of lumbar spine   . Hypercholesteremia   . Hyperlipidemia   . Hypertension    benign   . Thyroid cancer ([place]) 2001    Family History    Problem Relation Age of Onset  . Heart attack Father   . Heart failure Mother   . Asthma Daughter   . Sleep apnea Son        CPAP  . Colon cancer Neg Hx     Past Surgical History:  Procedure Laterality Date  . ABDOMINAL HYSTERECTOMY    . CARDIAC CATHETERIZATION    . CATARACT EXTRACTION W/PHACO Right 07/17/2012   Procedure: CATARACT EXTRACTION PHACO AND INTRAOCULAR LENS PLACEMENT (IOC);  Surgeon: Tonny Branch, MD;  Location: [facility];  Service: Ophthalmology;  Laterality: Right;  CDE:25.51  . CATARACT EXTRACTION W/PHACO Left 09/17/2016   Procedure: CATARACT EXTRACTION PHACO AND INTRAOCULAR LENS PLACEMENT LEFT EYE;  Surgeon: Tonny Branch, MD;  Location: [facility];  Service: Ophthalmology;  Laterality: Left;  CDE: 19.23  . COLONOSCOPY    . COLONOSCOPY N/A 01/15/2014   Procedure: COLONOSCOPY;  Surgeon: Daneil Dolin, MD;  Location: [facility];  Service: Endoscopy;  Laterality: N/A;  9:00 AM  . COLONOSCOPY  WITH PROPOFOL N/A 01/25/2019   Procedure: COLONOSCOPY WITH PROPOFOL;  Surgeon: Daneil Dolin, MD;  Location: [facility];  Service: Endoscopy;  Laterality: N/A;  9:00am  . DOPPLER ECHOCARDIOGRAPHY    . ESOPHAGOGASTRODUODENOSCOPY (EGD) WITH PROPOFOL N/A 01/25/2019   Procedure: ESOPHAGOGASTRODUODENOSCOPY (EGD) WITH PROPOFOL;  Surgeon: Daneil Dolin, MD;  Location: [facility];  Service: Endoscopy;  Laterality: N/A;  . JOINT REPLACEMENT  07/01/2010   left hip  . KNEE SURGERY Right    arthroscopy  . left hip replaced  07/01/2010  . SPINE SURGERY  2006   cervical  . stress dipyridamole myocardial perfusion    . THYROIDECTOMY    . TONSILLECTOMY    . VESICOVAGINAL FISTULA CLOSURE W/ TAH     Social History   Occupational History  . Occupation: retired     Comment: bank  Tobacco Use  . Smoking status: Passive Smoke Exposure - Never Smoker  . Smokeless tobacco: Never Used  . Tobacco comment: Husband smokes in the home  Substance and Sexual Activity  . Alcohol use: No  . Drug use: No   . Sexual activity: Never

## 2019-04-14 ENCOUNTER — Encounter: Payer: Self-pay | Admitting: Family Medicine

## 2019-04-16 ENCOUNTER — Ambulatory Visit: Payer: Medicare HMO | Admitting: Physician Assistant

## 2019-04-18 ENCOUNTER — Ambulatory Visit (HOSPITAL_COMMUNITY): Payer: Medicare HMO

## 2019-04-23 ENCOUNTER — Ambulatory Visit (HOSPITAL_COMMUNITY): Payer: Medicare HMO

## 2019-04-27 ENCOUNTER — Ambulatory Visit (HOSPITAL_COMMUNITY): Payer: Medicare HMO

## 2019-04-29 ENCOUNTER — Other Ambulatory Visit: Payer: Self-pay | Admitting: Internal Medicine

## 2019-04-29 ENCOUNTER — Other Ambulatory Visit: Payer: Self-pay

## 2019-04-29 ENCOUNTER — Ambulatory Visit: Payer: Medicare HMO | Attending: Internal Medicine

## 2019-04-29 DIAGNOSIS — Z23 Encounter for immunization: Secondary | ICD-10-CM

## 2019-04-29 DIAGNOSIS — E1159 Type 2 diabetes mellitus with other circulatory complications: Secondary | ICD-10-CM

## 2019-04-29 NOTE — Progress Notes (Signed)
   Covid-19 Vaccination Clinic  Name:  Jennifer Blackburn    MRN: [id number] DOB: December 25, 1945  04/29/2019  Jennifer Blackburn was observed post Covid-19 immunization for 30 minutes based on pre-vaccination screening without incidence. She was provided with Vaccine Information Sheet and instruction to access the V-Safe system.   Jennifer Blackburn was instructed to call 911 with any severe reactions post vaccine: Marland Kitchen Difficulty breathing  . Swelling of your face and throat  . A fast heartbeat  . A bad rash all over your body  . Dizziness and weakness    Immunizations Administered    Name Date Dose VIS Date Route   Moderna COVID-19 Vaccine 04/29/2019  4:11 PM 0.5 mL 02/20/2019 Intramuscular   Manufacturer: Moderna   Lot: [id number]   [id number]

## 2019-05-02 ENCOUNTER — Other Ambulatory Visit: Payer: Self-pay

## 2019-05-03 ENCOUNTER — Encounter: Payer: Self-pay | Admitting: Internal Medicine

## 2019-05-03 ENCOUNTER — Ambulatory Visit: Payer: Medicare HMO | Admitting: Internal Medicine

## 2019-05-03 VITALS — BP 120/78 | HR 75 | Ht 65.0 in | Wt 185.0 lb

## 2019-05-03 DIAGNOSIS — E1159 Type 2 diabetes mellitus with other circulatory complications: Secondary | ICD-10-CM | POA: Diagnosis not present

## 2019-05-03 DIAGNOSIS — E890 Postprocedural hypothyroidism: Secondary | ICD-10-CM | POA: Diagnosis not present

## 2019-05-03 DIAGNOSIS — C73 Malignant neoplasm of thyroid gland: Secondary | ICD-10-CM

## 2019-05-03 LAB — POCT GLYCOSYLATED HEMOGLOBIN (HGB A1C): Hemoglobin A1C: 9.3 % — AB (ref 4.0–5.6)

## 2019-05-03 MED ORDER — INSULIN LISPRO (1 UNIT DIAL) 100 UNIT/ML (KWIKPEN): PEN_INJECTOR | SUBCUTANEOUS | 2 refills | Status: DC

## 2019-05-03 NOTE — Patient Instructions (Addendum)
Please continue: - Metformin 1000 mg 2x a day with meals. - Ozempic 0.5 mg weekly  Please change: - Toujeo 30 units in a.m. and 40 units at bedtime  Try to add: - Lispro 8-10 mg 3x a day 15 min before the main meals  Please continue levothyroxine 150 mcg daily.  Take the thyroid hormone every day, with water, at least 30 minutes before breakfast, separated by at least 4 hours from: - acid reflux medications - calcium - iron - multivitamins  Please move Protonix >4h after Levothyroxine.   Come back for labs in 5 weeks after the change.  Please return in 3-4 months with your sugar log.

## 2019-05-03 NOTE — Progress Notes (Signed)
Patient ID: Jennifer Blackburn, female   DOB: 28-Oct-1945, 74 y.o.   MRN: [id number]   This visit occurred during the SARS-CoV-2 public health emergency.  Safety protocols were in place, including screening questions prior to the visit, additional usage of staff PPE, and extensive cleaning of exam room while observing appropriate contact time as indicated for disinfecting solutions.   HPI: Jennifer Blackburn is a 74 y.o.-year-old female, returning for follow-up for DM2, dx in 2001, insulin-dependent since 2017, uncontrolled, with long-term complications (DR, CAD) and also papillary thyroid cancer and postsurgical hypothyroidism.  Last visit 4 months ago.  At last visit, I suggested to add NovoLog, but she could not get it from the pharmacy as this was not covered.  We tried to send Humalog and the lispro but the pharmacy did not call her about these prescriptions.  However, she was able to restart Ozempic since then.  Sugars remain high, though  DM2: Reviewed HbA1c levels: Lab Results  Component Value Date   HGBA1C 8.8 (A) 12/28/2018   HGBA1C 8.9 (A) 09/21/2018   HGBA1C 8.4 (A) 05/26/2018   HGBA1C 8.0 (A) 02/07/2018   HGBA1C 8.9 08/06/2017   HGBA1C 8.0 10/20/2015   HGBA1C 9.0 (H) 11/14/2014   HGBA1C 9.0 (H) 07/10/2014   HGBA1C 9.2 (H) 12/28/2013   HGBA1C 8.0 (H) 07/30/2013  01/12/2018: HbA1c 8.7% 08/05/2017: HbA1c 8.9% 07/11/2017: HbA1c 9.1%  Pt is on a regimen of: - Metformin 1000 mg 2x a day with meals. - Toujeo 30 units in a.m. and 50 units at bedtime -  Not covered, did not get it.  - Ozempic 0.5 mg  - stated 01/2019 She was on Ozempic 0.5 mg weekly in a.m. >> 1 mg  - constipation >> stopped 06/2018.  We added it back 09/2018. She was on Victoza >> $$$. She was on Jardiance >> $$$. She was on Actos >> swelling - stopped Spring 2019. She was on Glipizide XL 5 mg daily >> stopped 07/2017.  We retried glipizide 5 mg twice a day but had to stop 12/2018 due to lack of effect.  Pt checks  her sugars 1-2 times daily. - am: 150-180 >> 137-163 >> 110-160 >> 120-140 >> 160-268 - 2h after b'fast: n/c - before lunch: n/c - 2h after lunch: n/c >> 265-300s >> n/c - before dinner: n/c - 2h after dinner: n/c - bedtime: 198-210, 320 >> 230-240 >> 200s >> n/c - nighttime: n/c Lowest sugar was 110 >> 137; she has hypoglycemia awareness in the 60s. Highest sugar was 320 >> 240 .  Glucometer: AccuChek Aviva  Pt's meals are: - Breakfast:toast, cereal, milk; oatmeal; bacon + egg - Lunch: skips  - Dinner: hamburger, or chicken, or fish, + veggies, bread, water and tea - Snacks: 2- fruit She was seeing a dietitian in [place]. She walks for exercise.  -No CKD, last BUN/creatinine:  Lab Results  Component Value Date   BUN 15 12/21/2018   BUN 11 01/18/2018   CREATININE 0.76 12/21/2018   CREATININE 0.79 01/18/2018  On quinapril 10.  -She has hyperlipidemia; last set of lipids: Lab Results  Component Value Date   CHOL 189 12/21/2018   HDL 42 (L) 12/21/2018   LDLCALC 117 (H) 12/21/2018   TRIG 179 (H) 12/21/2018   CHOLHDL 4.5 12/21/2018  On pravastatin 80.  - last eye exam was in 05/2018:?  DR. Dr. Eulas Post in [place]. + cataract sx in both eyes.  - no numbness and tingling in her feet. Has a  podiatrist.   On ASA 81.  Pt has no FH of DM.  She also has a h/o Thyroid cancer (2001), s/p RAI treatment, now with postsurgical hypothyroidism.  In the last year we have been reducing her levothyroxine dose.  At last visit we reduce the dose to 150 mcg daily: - in am - fasting - at least 30 min from b'fast - no Ca, MVI, + iron 4 hours later - added Protonix 40 mg 2x a day since last OV - first dose 30 min after LT4 - not on Biotin  Reviewed her TFTs: Lab Results  Component Value Date   TSH 0.04 (L) 12/28/2018   TSH 0.06 (L) 09/21/2018   TSH 0.61 05/26/2018   TSH 2.49 03/23/2016   TSH 0.070 (L) 10/13/2010   TSH 0.035 (L) 09/22/2006   TSH 0.015 (L) 06/17/2006   11/03/2017: TSH 0.044  Her thyroglobulin levels were detectable but decreased at last check: Lab Results  Component Value Date   THYROGLB 0.3 (L) 09/21/2018   THYROGLB 0.7 (L) 05/26/2018   Lab Results  Component Value Date   THGAB <1 09/21/2018   THGAB <1 05/26/2018   Thyroid ultrasound (06/10/2018): 1.1 cm nonspecific calcified lesion in the right upper neck.  CT was recommended: There is no residual or recurrent tissue in the right or left thyroid beds. There is no evidence of abnormal adenopathy by short axis diameter measurement criteria There is a nonspecific calcified soft tissue mass measuring 1.1 cm in the right superior neck. This is of unknown significance. CT neck can be performed to further characterize.  CT (10/18/2018): Status post thyroidectomy. No soft tissue mass within the thyroidectomy bed. A 13 x 7 mm ovoid focus of calcification in the right aspect of the thyroidectomy bed likely corresponding with the finding on recent neck ultrasound and is unchanged as compared to neck CT 10/24/2007, benign.  No pathologically enlarged cervical chain lymph nodes. A nonenlarged calcified right supraclavicular lymph node is new from prior neck CT 10/24/2007 but also favored benign. Attention recommended on follow-up.  Reviewed records per Dr. Arletta Bale: Patient is status post total thyroidectomy in 2004 1.3 cm papillary thyroid cancer of the right lobe, followed by 3x RAI tx's with  33 mCi I-131 and a fourth RAI dose inpatient: 125 mCi, at [facility].    Whole-body scan on 09/08/2007 showed an increase in the thyroid bed activity and the follow up study with PET scan 10/08/2007 showed mildly hypermetabolic cervical lymph nodes.    Repeat PET 05/10/2008 showed no significant changes felt likely to be due to to a reactive process.  11/03/2017: TSH 0.044, thyroglobulin 0.2, ATA <1.0  04/05/2017: TSH 0.021 (0.45-4.5), thyroglobulin 0.3 (by IMA), ATA <1.0  She also has a history of HTN,  anemia.  Her daughter lives in [place] and she is sick.  Patient travels between here in [place] frequently.   ROS: Constitutional: no weight gain/no weight loss, + fatigue, no subjective hyperthermia, no subjective hypothermia Eyes: no blurry vision, no xerophthalmia ENT: no sore throat, + see HPI Cardiovascular: no CP/no SOB/no palpitations/no leg swelling Respiratory: no cough/no SOB/no wheezing Gastrointestinal: no N/no V/no D/no C/no acid reflux Musculoskeletal: no muscle aches/no joint aches Skin: no rashes, no hair loss Neurological: no tremors/no numbness/no tingling/no dizziness  I reviewed pt's medications, allergies, PMH, social hx, family hx, and changes were documented in the history of present illness. Otherwise, unchanged from my initial visit note.  Past Medical History:  Diagnosis Date  .  Anxiety   . CAD (coronary artery disease)   . Depression   . Diabetes mellitus type II    without complication  . DJD (degenerative joint disease) of lumbar spine   . Hypercholesteremia   . Hyperlipidemia   . Hypertension    benign   . Thyroid cancer ([place]) 2001   Past Surgical History:  Procedure Laterality Date  . ABDOMINAL HYSTERECTOMY    . CARDIAC CATHETERIZATION    . CATARACT EXTRACTION W/PHACO Right 07/17/2012   Procedure: CATARACT EXTRACTION PHACO AND INTRAOCULAR LENS PLACEMENT (IOC);  Surgeon: Tonny Branch, MD;  Location: [facility];  Service: Ophthalmology;  Laterality: Right;  CDE:25.51  . CATARACT EXTRACTION W/PHACO Left 09/17/2016   Procedure: CATARACT EXTRACTION PHACO AND INTRAOCULAR LENS PLACEMENT LEFT EYE;  Surgeon: Tonny Branch, MD;  Location: [facility];  Service: Ophthalmology;  Laterality: Left;  CDE: 19.23  . COLONOSCOPY    . COLONOSCOPY N/A 01/15/2014   Procedure: COLONOSCOPY;  Surgeon: Daneil Dolin, MD;  Location: [facility];  Service: Endoscopy;  Laterality: N/A;  9:00 AM  . COLONOSCOPY WITH PROPOFOL N/A 01/25/2019   Procedure: COLONOSCOPY WITH  PROPOFOL;  Surgeon: Daneil Dolin, MD;  Location: [facility];  Service: Endoscopy;  Laterality: N/A;  9:00am  . DOPPLER ECHOCARDIOGRAPHY    . ESOPHAGOGASTRODUODENOSCOPY (EGD) WITH PROPOFOL N/A 01/25/2019   Procedure: ESOPHAGOGASTRODUODENOSCOPY (EGD) WITH PROPOFOL;  Surgeon: Daneil Dolin, MD;  Location: [facility];  Service: Endoscopy;  Laterality: N/A;  . JOINT REPLACEMENT  07/01/2010   left hip  . KNEE SURGERY Right    arthroscopy  . left hip replaced  07/01/2010  . SPINE SURGERY  2006   cervical  . stress dipyridamole myocardial perfusion    . THYROIDECTOMY    . TONSILLECTOMY    . VESICOVAGINAL FISTULA CLOSURE W/ TAH     Social History   Socioeconomic History  . Marital status: Married    Spouse name: Not on file  . Number of children: 2  . Years of education: 67  . Highest education level: Not on file  Occupational History  . Occupation: retired     Comment: bank  Tobacco Use  . Smoking status: Passive Smoke Exposure - Never Smoker  . Smokeless tobacco: Never Used  . Tobacco comment: Husband smokes in the home  Substance and Sexual Activity  . Alcohol use: No  . Drug use: No  . Sexual activity: Never  Other Topics Concern  . Not on file  Social History Narrative   Lives with husband, at home   No caffeine   Social Determinants of Health   Financial Resource Strain: Low Risk   . Difficulty of Paying Living Expenses: Not hard at all  Food Insecurity: No Food Insecurity  . Worried About Charity fundraiser in the Last Year: Never true  . Ran Out of Food in the Last Year: Never true  Transportation Needs: No Transportation Needs  . Lack of Transportation (Medical): No  . Lack of Transportation (Non-Medical): No  Physical Activity: Sufficiently Active  . Days of Exercise per Week: 5 days  . Minutes of Exercise per Session: 40 min  Stress: No Stress Concern Present  . Feeling of Stress : Not at all  Social Connections: Not Isolated  . Frequency of  Communication with Friends and Family: More than three times a week  . Frequency of Social Gatherings with Friends and Family: Three times a week  . Attends Religious Services: More than 4  times per year  . Active Member of Clubs or Organizations: Yes  . Attends Archivist Meetings: More than 4 times per year  . Marital Status: Married  Human resources officer Violence: Not At Risk  . Fear of Current or Ex-Partner: No  . Emotionally Abused: No  . Physically Abused: No  . Sexually Abused: No   Current Outpatient Medications  Medication Sig Dispense Refill  . acetaminophen (TYLENOL) 500 MG tablet Take 1,000 mg by mouth every 6 (six) hours as needed (for pain.).    Marland Kitchen aspirin EC 81 MG tablet Take 1 tablet (81 mg total) by mouth daily.    . Cholecalciferol (VITAMIN D3) 50 MCG (2000 UT) TABS Take 2,000 Units by mouth daily.    Marland Kitchen dicyclomine (BENTYL) 10 MG capsule Take 1 capsule (10 mg total) by mouth 4 (four) times daily -  before meals and at bedtime. 90 capsule 1  . ferrous sulfate 325 (65 FE) MG tablet Take 325 mg by mouth daily at 12 noon.     . fluticasone (FLONASE) 50 MCG/ACT nasal spray Place 2 sprays into both nostrils daily. (Patient taking differently: Place 2 sprays into both nostrils daily as needed for allergies. ) 16 g 6  . gabapentin (NEURONTIN) 100 MG capsule TAKE 1 CAPSULE(100 MG) BY MOUTH THREE TIMES DAILY (Patient taking differently: Take 100 mg by mouth See admin instructions. Take 1 capsule (100 mg) by mouth scheduled in the morning, then take 1 capsule (100 mg) by mouth twice daily if needed for pain.) 270 capsule 1  . glucose blood (ACCU-CHEK AVIVA PLUS) test strip Use as instructed two times daily dx E11.65 150 each 5  . imipramine (TOFRANIL) 25 MG tablet TAKE 4 TABLETS BY MOUTH EVERY NIGHT AT BEDTIME (Patient taking differently: Take 50-100 mg by mouth at bedtime. ) 120 tablet 5  . insulin lispro (HUMALOG) 100 UNIT/ML KwikPen Inject 8-10 units under the skin three times  daily before meals. Please give Lispro, not name brand. 15 mL 2  . Insulin Pen Needle 32G X 4 MM MISC Use 4x a day 300 each 3  . isosorbide mononitrate (IMDUR) 30 MG 24 hr tablet TAKE HALF A TABLET BY MOUTH EVERY DAY (Patient taking differently: Take 15 mg by mouth daily. ) 45 tablet 3  . Lancets (ACCU-CHEK MULTICLIX) lancets Use as instructed three times daily dx 250.01 100 each 5  . levothyroxine (SYNTHROID) 150 MCG tablet Take 1 tablet (150 mcg total) by mouth daily. (Patient taking differently: Take 150 mcg by mouth daily before breakfast. ) 45 tablet 3  . loratadine (CLARITIN) 10 MG tablet Take 10 mg by mouth daily.     . Menthol, Topical Analgesic, (ICY HOT EX) Apply 1 application topically 3 (three) times daily as needed (for back pain.).    Marland Kitchen metFORMIN (GLUCOPHAGE) 1000 MG tablet TAKE 1 TABLET TWICE A DAY 180 tablet 1  . metoprolol tartrate (LOPRESSOR) 50 MG tablet Take 1 tablet (50 mg total) by mouth 2 (two) times daily. 180 tablet 3  . pantoprazole (PROTONIX) 40 MG tablet Take 1 tablet (40 mg total) by mouth daily. 90 tablet 1  . PARoxetine (PAXIL) 20 MG tablet Take 1 tablet (20 mg total) by mouth daily. Patient reports that she takes it 1-2 times week. (Patient taking differently: Take 20 mg by mouth daily. ) 90 tablet 3  . pravastatin (PRAVACHOL) 80 MG tablet TAKE 1 TABLET DAILY 90 tablet 1  . pravastatin (PRAVACHOL) 80 MG tablet Take 1 tablet (  80 mg total) by mouth daily. 90 tablet 3  . quinapril (ACCUPRIL) 10 MG tablet TAKE 1 TABLET DAILY 90 tablet 1  . simethicone (PHAZYME) 125 MG chewable tablet Chew 1 tablet (125 mg total) by mouth every 6 (six) hours as needed for flatulence. 30 tablet 0  . TOUJEO SOLOSTAR 300 UNIT/ML SOPN INJECT 30-50 UNITS INTO THE SKIN 2 TIMES DAILY. (OVER $600) 9 pen 2  . verapamil (CALAN-SR) 180 MG CR tablet TAKE 1 TABLET(180 MG) BY MOUTH AT BEDTIME (Patient taking differently: Take 180 mg by mouth at bedtime. ) 30 tablet 11  . vitamin B-12 (CYANOCOBALAMIN)  1000 MCG tablet Take 1 tablet (1,000 mcg total) by mouth daily. 30 tablet 5   No current facility-administered medications for this visit.     Allergies  Allergen Reactions  . Lipitor [Atorvastatin Calcium] Other (See Comments)    Muscle aches  . Daypro [Oxaprozin] Hives  . Sulfonamide Derivatives Hives   Family History  Problem Relation Age of Onset  . Heart attack Father   . Heart failure Mother   . Asthma Daughter   . Sleep apnea Son        CPAP  . Colon cancer Neg Hx     PE: LMP 11/11/2016  Wt Readings from Last 3 Encounters:  04/12/19 190 lb (86.2 kg)  04/03/19 190 lb (86.2 kg)  02/07/19 187 lb (84.8 kg)   Constitutional: overweight, in NAD Eyes: PERRLA, EOMI, no exophthalmos ENT: moist mucous membranes, no neck masses palpable, no cervical lymphadenopathy Cardiovascular: RRR, No MRG Respiratory: CTA B Gastrointestinal: abdomen soft, NT, ND, BS+ Musculoskeletal: no deformities, strength intact in all 4 Skin: moist, warm, no rashes Neurological: no tremor with outstretched hands, DTR normal in all 4  ASSESSMENT: 1. DM2, insulin-dependent, uncontrolled, with long-term complications - CAD - DR  2.  Papillary thyroid cancer  3.  Postsurgical hypothyroidism  PLAN:  1. Patient with longstanding, uncontrolled, type 2 diabetes, on Metformin, basal-bolus insulin regimen and previously on Ozempic, however, of the GLP-1 receptor agonist before last visit as she could not afford it.  We started glipizide before meals but this did not work on her postprandial blood sugar so at last visit we added NovoLog.  At that time, we decreased her Toujeo dose. -At this visit, she only checks sugars in the morning (12 hours offset on her meter-advised her to change her glucometer time) and they are quite high.  She does not check sugars later in the day to determine trends.  At this visit, we discussed about continuing Ozempic (she had constipation with the 1 mg dose so we will stay on  the 0.5 mg weekly dose), will try again to add lispro and I advised her to let me know if this is not covered in which case, will need regular insulin.  In the meantime, we will decrease her evening Toujeo dose. - I suggested to:  Patient Instructions  Please continue: - Metformin 1000 mg 2x a day with meals. - Ozempic 0.5 mg weekly  Please change: - Toujeo 30 units in a.m. and 40 units at bedtime  Try to add: - Lispro 8-10 mg 3x a day 15 min before the main meals  Please continue levothyroxine 150 mcg daily.  Take the thyroid hormone every day, with water, at least 30 minutes before breakfast, separated by at least 4 hours from: - acid reflux medications - calcium - iron - multivitamins  Please stop at the lab.  Please return in 3-4  months with your sugar log.   - we checked her HbA1c: 9.3% (higher) - advised to check sugars at different times of the day - 3x a day, rotating check times - advised for yearly eye exams >> she is UTD - return to clinic in 3-4 months    2.  Papillary thyroid cancer -Previous thyroid cancer history records were reviewed from Dr. Ronnald Collum -Her thyroid cancer appears to be metastatic, since she had to have 4 RAI treatments.  She also had increased signal on the PET scan from 2009, however, in 2010, another PET scan showed possible inflammatory lymph nodes in the area.   -Her previous thyroglobulin levels were detectable.  Neck ultrasounds showed a nonspecific calcified lesion in the right upper neck and we obtained a neck CT before last visit which showed a stable calcified mass since 2009, most likely benign.   -We will continue to follow her thyroglobulin.  We will check this at next visit along with the ATA antibodies.  3.  Postsurgical hypothyroidism - latest thyroid labs reviewed with pt >> suppressed at last visit, after which we decreased her dose of levothyroxine further Lab Results  Component Value Date   TSH 0.04 (L) 12/28/2018   - she  continues on LT4 150 mcg daily - pt feels very fatigued at this visit - we discussed about taking the thyroid hormone every day, with water, >30 minutes before breakfast, separated by >4 hours from acid reflux medications, calcium, iron, multivitamins. Pt. is not taking it correctly, she added Protonix since last visit, with the first dose 30 minutes after levothyroxine.  At this visit, we moved Protonix at least 4 hours after levothyroxine I will have her back for labs in 5 weeks after the above change.  I explained that decrease levothyroxine absorption is the most likely cause for her fatigue, however, the high blood sugars can also contribute. - will check thyroid tests today: TSH and fT4 - If labs are abnormal, she will need to return for repeat TFTs in 1.5 months  Orders Placed This Encounter  Procedures  . T4, free  . TSH   Philemon Kingdom, MD PhD [facility] Endocrinology

## 2019-05-03 NOTE — Addendum Note (Signed)
Addended by: Cardell Peach I on: 05/03/2019 02:59 PM   Modules accepted: Orders

## 2019-05-06 ENCOUNTER — Encounter: Payer: Self-pay | Admitting: Gastroenterology

## 2019-05-06 NOTE — Progress Notes (Signed)
Referring Provider: Fayrene Helper, MD Primary Care Physician:  Fayrene Helper, MD Primary GI Physician: Dr. Gala Romney  Chief Complaint  Patient presents with  . Follow-up    fu from EGD and TCS, feeling weak at times    HPI:   Jennifer Blackburn is a 74 y.o. female presenting today for postprocedure follow-up.  She was last seen in our office on 11/08/2022 uncontrolled GERD, dysphagia, postprandial diarrhea with associated abdominal cramping since starting Ozempic, and history of colon polyps.  She was started on Protonix 40 mg by her PCP prior to our office visit.  Daytime reflux symptoms had improved with Protonix as well as decreasing vegetables with vinegar; however, nocturnal reflux symptoms continued.  Plans for EGD with possible dilation, start Protonix 40 mg before dinner, TCS for surveillance, and Bentyl 10 mg before meals for diarrhea.  Procedures on 01/25/2019: Colonoscopy: Diverticulosis in the entire colon, otherwise normal exam.  No recommendations to repeat colonoscopy due to age. EGD: Normal esophagus without dilation due to inability to pass dilator beyond the hypopharynx, 1 nonbleeding gastric ulcer without stigmata of bleeding s/p biopsied, otherwise normal exam.  Recommended increasing Protonix to 40 mg twice daily, avoid NSAIDs, and follow-up in 3 months. Pathology revealed ulcer with reactive changes, no H. pylori, metaplasia, dysplasia, or malignancy.  Today:  GERD is well controlled. Taking Protonix BID. No breakthrough symptoms. No nausea or vomiting. Dysphagia has improved. Occurs rarely with breads. No blood in the stool or black stools. Was taking ibuprofen about daily for pain. Was also taking 81 mg aspirin. Stopped all NSAIDs including baby aspirin after EGD. Wanting to know if aspirin is ok to resume.   Diarrhea/abdominal cramping has resolved. Not taking Bentyl. Never ended up needing this. Having BMs daily to every other day.   Saw Endocrinologists  last Friday. Protonix was interacting with her synthroid. Was told to separate medications by 4 hours.  Feels tired, and fatigued. Endo felt it was likely her thyroid. No pre-syncope.  Will follow-up with them in 5 weeks. Fatigue has been present for several months. Was present when I saw her last time. Somewhat worsened.  No presyncope or syncope.  Denies fever, chills, unintentional weight loss.  Denies chest pain or heart palpitations.  Denies shortness of breath or coughing.   Past Medical History:  Diagnosis Date  . Anxiety   . CAD (coronary artery disease)   . Depression   . Diabetes mellitus type II    without complication  . DJD (degenerative joint disease) of lumbar spine   . Hypercholesteremia   . Hyperlipidemia   . Hypertension    benign   . Thyroid cancer ([place]) 2001    Past Surgical History:  Procedure Laterality Date  . ABDOMINAL HYSTERECTOMY    . CARDIAC CATHETERIZATION    . CATARACT EXTRACTION W/PHACO Right 07/17/2012   Procedure: CATARACT EXTRACTION PHACO AND INTRAOCULAR LENS PLACEMENT (IOC);  Surgeon: Tonny Branch, MD;  Location: [facility];  Service: Ophthalmology;  Laterality: Right;  CDE:25.51  . CATARACT EXTRACTION W/PHACO Left 09/17/2016   Procedure: CATARACT EXTRACTION PHACO AND INTRAOCULAR LENS PLACEMENT LEFT EYE;  Surgeon: Tonny Branch, MD;  Location: [facility];  Service: Ophthalmology;  Laterality: Left;  CDE: 19.23  . COLONOSCOPY    . COLONOSCOPY N/A 01/15/2014   Procedure: COLONOSCOPY;  Surgeon: Daneil Dolin, MD;  Location: [facility];  Service: Endoscopy;  Laterality: N/A;  9:00 AM  . COLONOSCOPY WITH PROPOFOL N/A 01/25/2019   Procedure:  COLONOSCOPY WITH PROPOFOL;  Surgeon: Daneil Dolin, MD; diverticulosis in the entire colon, otherwise normal exam.  No repeat colonoscopy due to age.  . DOPPLER ECHOCARDIOGRAPHY    . ESOPHAGOGASTRODUODENOSCOPY (EGD) WITH PROPOFOL N/A 01/25/2019   Procedure: ESOPHAGOGASTRODUODENOSCOPY (EGD) WITH PROPOFOL;  Surgeon: Daneil Dolin, MD; normal esophagus without dilation due to inability to pass dilator beyond the hypopharynx, 1 small nonbleeding gastric ulcer s/p biopsied, otherwise normal exam.  Pathology with ulcer with reactive changes, no H. pylori, metaplasia, dysplasia, or malignancy.  Marland Kitchen JOINT REPLACEMENT  07/01/2010   left hip  . KNEE SURGERY Right    arthroscopy  . left hip replaced  07/01/2010  . SPINE SURGERY  2006   cervical  . stress dipyridamole myocardial perfusion    . THYROIDECTOMY    . TONSILLECTOMY    . VESICOVAGINAL FISTULA CLOSURE W/ TAH      Current Outpatient Medications  Medication Sig Dispense Refill  . acetaminophen (TYLENOL) 500 MG tablet Take 1,000 mg by mouth every 6 (six) hours as needed (for pain.).    Marland Kitchen Cholecalciferol (VITAMIN D3) 50 MCG (2000 UT) TABS Take 2,000 Units by mouth daily.    . ferrous sulfate 325 (65 FE) MG tablet Take 325 mg by mouth daily at 12 noon.     . fluticasone (FLONASE) 50 MCG/ACT nasal spray Place 2 sprays into both nostrils daily. (Patient taking differently: Place 2 sprays into both nostrils daily as needed for allergies. ) 16 g 6  . gabapentin (NEURONTIN) 100 MG capsule TAKE 1 CAPSULE(100 MG) BY MOUTH THREE TIMES DAILY (Patient taking differently: Take 100 mg by mouth See admin instructions. Take 1 capsule (100 mg) by mouth scheduled in the morning, then take 1 capsule (100 mg) by mouth twice daily if needed for pain.) 270 capsule 1  . glucose blood (ACCU-CHEK AVIVA PLUS) test strip Use as instructed two times daily dx E11.65 150 each 5  . imipramine (TOFRANIL) 25 MG tablet TAKE 4 TABLETS BY MOUTH EVERY NIGHT AT BEDTIME (Patient taking differently: Take 50-100 mg by mouth at bedtime. ) 120 tablet 5  . insulin lispro (HUMALOG) 100 UNIT/ML KwikPen Inject 8-10 units under the skin three times daily before meals. Please give Lispro, not name brand. 15 mL 2  . Insulin Pen Needle 32G X 4 MM MISC Use 4x a day 300 each 3  . isosorbide mononitrate (IMDUR) 30 MG 24 hr  tablet TAKE HALF A TABLET BY MOUTH EVERY DAY (Patient taking differently: Take 15 mg by mouth daily. ) 45 tablet 3  . Lancets (ACCU-CHEK MULTICLIX) lancets Use as instructed three times daily dx 250.01 100 each 5  . levothyroxine (SYNTHROID) 150 MCG tablet Take 1 tablet (150 mcg total) by mouth daily. (Patient taking differently: Take 150 mcg by mouth daily before breakfast. ) 45 tablet 3  . loratadine (CLARITIN) 10 MG tablet Take 10 mg by mouth daily.     . Menthol, Topical Analgesic, (ICY HOT EX) Apply 1 application topically 3 (three) times daily as needed (for back pain.).    Marland Kitchen metFORMIN (GLUCOPHAGE) 1000 MG tablet TAKE 1 TABLET TWICE A DAY 180 tablet 1  . metoprolol tartrate (LOPRESSOR) 50 MG tablet Take 1 tablet (50 mg total) by mouth 2 (two) times daily. 180 tablet 3  . pantoprazole (PROTONIX) 40 MG tablet Take 1 tablet (40 mg total) by mouth daily. 90 tablet 1  . PARoxetine (PAXIL) 20 MG tablet Take 1 tablet (20 mg total) by mouth daily. Patient  reports that she takes it 1-2 times week. (Patient taking differently: Take 20 mg by mouth daily. ) 90 tablet 3  . pravastatin (PRAVACHOL) 80 MG tablet TAKE 1 TABLET DAILY 90 tablet 1  . quinapril (ACCUPRIL) 10 MG tablet TAKE 1 TABLET DAILY 90 tablet 1  . simethicone (PHAZYME) 125 MG chewable tablet Chew 1 tablet (125 mg total) by mouth every 6 (six) hours as needed for flatulence. 30 tablet 0  . TOUJEO SOLOSTAR 300 UNIT/ML SOPN INJECT 30-50 UNITS INTO THE SKIN 2 TIMES DAILY. (OVER $600) 9 pen 2  . verapamil (CALAN-SR) 180 MG CR tablet TAKE 1 TABLET(180 MG) BY MOUTH AT BEDTIME (Patient taking differently: Take 180 mg by mouth at bedtime. ) 30 tablet 11  . vitamin B-12 (CYANOCOBALAMIN) 1000 MCG tablet Take 1 tablet (1,000 mcg total) by mouth daily. 30 tablet 5  . aspirin EC 81 MG tablet Take 1 tablet (81 mg total) by mouth daily. (Patient not taking: Reported on 05/07/2019)    . dicyclomine (BENTYL) 10 MG capsule Take 1 capsule (10 mg total) by mouth 4  (four) times daily -  before meals and at bedtime. (Patient not taking: Reported on 05/07/2019) 90 capsule 1   No current facility-administered medications for this visit.    Allergies as of 05/07/2019 - Review Complete 05/07/2019  Allergen Reaction Noted  . Lipitor [atorvastatin calcium] Other (See Comments) 02/07/2019  . Daypro [oxaprozin] Hives 08/23/2016  . Sulfonamide derivatives Hives 04/14/2007    Family History  Problem Relation Age of Onset  . Heart attack Father   . Heart failure Mother   . Asthma Daughter   . Sleep apnea Son        CPAP  . Colon cancer Neg Hx     Social History   Socioeconomic History  . Marital status: Married    Spouse name: Not on file  . Number of children: 2  . Years of education: 21  . Highest education level: Not on file  Occupational History  . Occupation: retired     Comment: bank  Tobacco Use  . Smoking status: Passive Smoke Exposure - Never Smoker  . Smokeless tobacco: Never Used  . Tobacco comment: Husband smokes in the home  Substance and Sexual Activity  . Alcohol use: No  . Drug use: No  . Sexual activity: Never  Other Topics Concern  . Not on file  Social History Narrative   Lives with husband, at home   No caffeine   Social Determinants of Health   Financial Resource Strain: Low Risk   . Difficulty of Paying Living Expenses: Not hard at all  Food Insecurity: No Food Insecurity  . Worried About Charity fundraiser in the Last Year: Never true  . Ran Out of Food in the Last Year: Never true  Transportation Needs: No Transportation Needs  . Lack of Transportation (Medical): No  . Lack of Transportation (Non-Medical): No  Physical Activity: Sufficiently Active  . Days of Exercise per Week: 5 days  . Minutes of Exercise per Session: 40 min  Stress: No Stress Concern Present  . Feeling of Stress : Not at all  Social Connections: Not Isolated  . Frequency of Communication with Friends and Family: More than three times  a week  . Frequency of Social Gatherings with Friends and Family: Three times a week  . Attends Religious Services: More than 4 times per year  . Active Member of Clubs or Organizations: Yes  . Attends  Club or Organization Meetings: More than 4 times per year  . Marital Status: Married    Review of Systems: Gen: See HPI CV: See HPI Resp: See HPI GI: See HPI Derm: Denies rash Heme: Denies bruising or bleeding  Physical Exam: BP 112/63   Pulse 61   Temp 98.2 F (36.8 C) (Temporal)   Ht 5\' 5"  (1.651 m)   Wt 184 lb 6.4 oz (83.6 kg)   LMP 11/11/2016   BMI 30.69 kg/m  General:   Alert and oriented. No distress noted. Pleasant and cooperative.  Head:  Normocephalic and atraumatic. Eyes:  Conjuctiva clear without scleral icterus. Heart:  S1, S2 present without murmurs appreciated. Lungs:  Clear to auscultation bilaterally. No wheezes, rales, or rhonchi. No distress.  Abdomen:  +BS, soft, non-tender and non-distended. No rebound or guarding. No HSM or masses noted. Msk:  Symmetrical without gross deformities. Normal posture. Extremities:  Without edema. Neurologic:  Alert and  oriented x4 Psych:  Normal mood and affect.

## 2019-05-07 ENCOUNTER — Ambulatory Visit (INDEPENDENT_AMBULATORY_CARE_PROVIDER_SITE_OTHER): Payer: Medicare HMO | Admitting: Gastroenterology

## 2019-05-07 ENCOUNTER — Encounter: Payer: Self-pay | Admitting: *Deleted

## 2019-05-07 ENCOUNTER — Encounter: Payer: Self-pay | Admitting: Internal Medicine

## 2019-05-07 ENCOUNTER — Encounter: Payer: Self-pay | Admitting: Gastroenterology

## 2019-05-07 ENCOUNTER — Other Ambulatory Visit: Payer: Self-pay | Admitting: *Deleted

## 2019-05-07 ENCOUNTER — Other Ambulatory Visit: Payer: Self-pay

## 2019-05-07 VITALS — BP 112/63 | HR 61 | Temp 98.2°F | Ht 65.0 in | Wt 184.4 lb

## 2019-05-07 DIAGNOSIS — R197 Diarrhea, unspecified: Secondary | ICD-10-CM | POA: Diagnosis not present

## 2019-05-07 DIAGNOSIS — R1310 Dysphagia, unspecified: Secondary | ICD-10-CM

## 2019-05-07 DIAGNOSIS — K219 Gastro-esophageal reflux disease without esophagitis: Secondary | ICD-10-CM

## 2019-05-07 DIAGNOSIS — Z8719 Personal history of other diseases of the digestive system: Secondary | ICD-10-CM

## 2019-05-07 DIAGNOSIS — R5383 Other fatigue: Secondary | ICD-10-CM | POA: Diagnosis not present

## 2019-05-07 DIAGNOSIS — Z8711 Personal history of peptic ulcer disease: Secondary | ICD-10-CM | POA: Insufficient documentation

## 2019-05-07 NOTE — Assessment & Plan Note (Signed)
Improving.  EGD on 01/25/2019 with normal esophagus.  No empiric dilation due to inability to pass dilator beyond the hypopharynx.  Since increasing Protonix to 40 mg twice daily, she has had significant improvement in GERD symptoms and dysphagia has also improved.  Occurs rarely with breads.  Suspect dysphagia was likely secondary to uncontrolled GERD and will continue to improve as GERD remains well controlled.  Continue Protonix 40 mg twice daily 30 minutes before breakfast and 30 minutes before dinner. Continue to monitor dysphagia symptoms.  Patient advised to let us know if dysphagia symptoms begin to worsen.  Could consider BPE in the future. Follow-up in 6 months.

## 2019-05-07 NOTE — Assessment & Plan Note (Signed)
Patient reports a several months to 1 year history of fatigue.  Recently saw endocrinologist who felt this is likely related to her hypothyroidism as she has been taking Synthroid and Protonix together.  Plans to follow-up with endocrine in 5 weeks to have labs drawn.  She does have history of gastric ulcer that was noted on EGD on 01/25/2019; however, she denies bright red blood per rectum or melena, has been on Protonix 40 mg twice daily for the last 3 months, and has discontinued all NSAIDs. I doubt her fatigue is related to a GI etiology. I am more suspicious this is related to her thyroid function.  However, we will go ahead and update labs including CBC and CMP to ensure stability.  We will leave thyroid function testing to endocrinology.  Additionally, she is due for surveillance EGD which we will arrange.

## 2019-05-07 NOTE — Assessment & Plan Note (Signed)
Much improved on Protonix 40 mg twice daily.  Continue current medications.  She was advised to ensure Protonix is separated from levothyroxine at least 4 hours.  Discussed setting an alarm to take levothyroxine in the morning around 6 or 7 AM as she does not wake up typically until 10-11 AM and was taking all of her morning medications at the same time.  Follow-up in 6 months.

## 2019-05-07 NOTE — Assessment & Plan Note (Signed)
Resolved. Never required Bentyl.  Unclear etiology.  This may have been secondary to starting Ozempic that just improved slowly over time.  Colonoscopy on file from November 2020 with diverticulosis, otherwise normal.  No recommendations to repeat colonoscopy due to age.  Continue to monitor for return of symptoms.

## 2019-05-07 NOTE — Assessment & Plan Note (Addendum)
EGD on 01/25/19 for dysphagia and uncontrolled GERD revealed one nonbleeding gastric ulcer without stigmata of bleeding.  Pathology revealed ulcer with reactive changes, no H. pylori, metaplasia, dysplasia, or malignancy.  Recommendations to increase Protonix to 40 mg twice daily and repeat EGD in 3 months.  Since starting Protonix twice daily, patient has had significant improvement in GERD symptoms as well as dysphagia (although no dilation was performed due to inability to pass dilator beyond the hypopharynx).  She admitted to taking ibuprofen daily prior to EGD but has since discontinued all NSAIDs.  She is asking if she can resume baby aspirin.  Denies bright red blood per rectum or melena.  She does admit to several months-1 year history of fatigue which I do not suspect is related to any sort of GI etiology.  More suspicious this is related to hypothyroidism as she has been taking levothyroxine and Protonix together, but we will plan to update her labs to ensure hemoglobin is stable.   CBC, CMP Continue Protonix 40 mg twice daily. Advised she set an alarm at 6 or 7 AM to take levothyroxine as she does not typically wake up until 10-11 AM to take her other morning medications. Proceed with surveillance EGD with propofol with Dr. Gala Romney in the near future. The risks, benefits, and alternatives have been discussed in detail with patient. They have stated understanding and desire to proceed.  Hold iron for 7 days prior to procedure. As she has been on Protonix 40 mg BID x 3 months and she does have history of coronary artery disease, I feel it is okay for her to resume 81 mg aspirin. Avoid all other NSAIDs. Follow-up in 6 months.

## 2019-05-07 NOTE — Patient Instructions (Addendum)
We will get you scheduled for an upper endoscopy in the near future with Dr. Gala Romney to follow-up on the ulcer identified during your last procedure. 1 day prior to your procedure: Take one half dose of Toujeo the night before your procedure. Day of your procedure: Hold diabetes medications the morning of your procedure. Hold iron for 7 days prior to your procedure.  Please have labs completed.  Continue Protonix 40 mg twice daily 30 minutes before breakfast and 30 minutes before dinner.  May consider decreasing this to once daily after your upper endoscopy.  Try setting an alarm for 6-7 AM to take your Synthroid as this needs to be separated from Protonix at least 4 hours.  We will follow up with you in the office in about 6 months.  Call if you have questions or concerns prior.  Aliene Altes, PA-C [facility]

## 2019-05-08 LAB — CBC WITH DIFFERENTIAL/PLATELET
Absolute Monocytes: 503 cells/uL (ref 200–950)
Basophils Absolute: 38 cells/uL (ref 0–200)
Basophils Relative: 0.5 %
Eosinophils Absolute: 300 cells/uL (ref 15–500)
Eosinophils Relative: 4 %
HCT: 38.3 % (ref 35.0–45.0)
Hemoglobin: 12.2 g/dL (ref 11.7–15.5)
Lymphs Abs: 2685 cells/uL (ref 850–3900)
MCH: 26.9 pg — ABNORMAL LOW (ref 27.0–33.0)
MCHC: 31.9 g/dL — ABNORMAL LOW (ref 32.0–36.0)
MCV: 84.4 fL (ref 80.0–100.0)
MPV: 10.4 fL (ref 7.5–12.5)
Monocytes Relative: 6.7 %
Neutro Abs: 3975 cells/uL (ref 1500–7800)
Neutrophils Relative %: 53 %
Platelets: 283 10*3/uL (ref 140–400)
RBC: 4.54 10*6/uL (ref 3.80–5.10)
RDW: 12.7 % (ref 11.0–15.0)
Total Lymphocyte: 35.8 %
WBC: 7.5 10*3/uL (ref 3.8–10.8)

## 2019-05-08 LAB — COMPLETE METABOLIC PANEL WITH GFR
AG Ratio: 1.3 (calc) (ref 1.0–2.5)
ALT: 18 U/L (ref 6–29)
AST: 17 U/L (ref 10–35)
Albumin: 4.4 g/dL (ref 3.6–5.1)
Alkaline phosphatase (APISO): 44 U/L (ref 37–153)
BUN: 17 mg/dL (ref 7–25)
CO2: 29 mmol/L (ref 20–32)
Calcium: 9.5 mg/dL (ref 8.6–10.4)
Chloride: 97 mmol/L — ABNORMAL LOW (ref 98–110)
Creat: 0.85 mg/dL (ref 0.60–0.93)
GFR, Est African American: 78 mL/min/{1.73_m2} (ref >=60)
GFR, Est Non African American: 67 mL/min/{1.73_m2} (ref >=60)
Globulin: 3.4 g/dL (calc) (ref 1.9–3.7)
Glucose, Bld: 153 mg/dL — ABNORMAL HIGH (ref 65–139)
Potassium: 5 mmol/L (ref 3.5–5.3)
Sodium: 136 mmol/L (ref 135–146)
Total Bilirubin: 0.3 mg/dL (ref 0.2–1.2)
Total Protein: 7.8 g/dL (ref 6.1–8.1)

## 2019-05-08 NOTE — Progress Notes (Signed)
Hemoglobin improved to 12.2 and now within normal limits.  Glucose elevated at 153.  No other significant abnormalities on CBC or CMP.  Kidney function, liver function tests, and electrolytes within normal limits.  We will proceed with EGD as planned and she should follow up with endocrinology for thyroid function testing as planned.

## 2019-05-14 ENCOUNTER — Other Ambulatory Visit: Payer: Self-pay

## 2019-05-14 ENCOUNTER — Ambulatory Visit (HOSPITAL_COMMUNITY)
Admission: RE | Admit: 2019-05-14 | Discharge: 2019-05-14 | Disposition: A | Discharge status: Home / Self Care | Payer: Medicare HMO | Source: Ambulatory Visit | Attending: Family Medicine | Admitting: Family Medicine

## 2019-05-14 DIAGNOSIS — G44029 Chronic cluster headache, not intractable: Secondary | ICD-10-CM | POA: Diagnosis not present

## 2019-05-14 DIAGNOSIS — S0990XA Unspecified injury of head, initial encounter: Secondary | ICD-10-CM | POA: Diagnosis not present

## 2019-05-14 DIAGNOSIS — G44329 Chronic post-traumatic headache, not intractable: Secondary | ICD-10-CM | POA: Insufficient documentation

## 2019-05-14 DIAGNOSIS — G4452 New daily persistent headache (NDPH): Secondary | ICD-10-CM | POA: Diagnosis not present

## 2019-05-15 ENCOUNTER — Other Ambulatory Visit: Payer: Self-pay | Admitting: Family Medicine

## 2019-05-15 DIAGNOSIS — R930 Abnormal findings on diagnostic imaging of skull and head, not elsewhere classified: Secondary | ICD-10-CM

## 2019-05-15 DIAGNOSIS — J3489 Other specified disorders of nose and nasal sinuses: Secondary | ICD-10-CM

## 2019-05-15 NOTE — Progress Notes (Signed)
amb ent  

## 2019-05-22 ENCOUNTER — Ambulatory Visit: Payer: Medicare Other | Admitting: Family Medicine

## 2019-05-25 ENCOUNTER — Ambulatory Visit (INDEPENDENT_AMBULATORY_CARE_PROVIDER_SITE_OTHER): Payer: Medicare HMO | Admitting: Otolaryngology

## 2019-05-25 ENCOUNTER — Other Ambulatory Visit: Payer: Self-pay

## 2019-05-25 VITALS — Temp 97.3°F

## 2019-05-25 DIAGNOSIS — J0130 Acute sphenoidal sinusitis, unspecified: Secondary | ICD-10-CM

## 2019-05-25 DIAGNOSIS — J310 Chronic rhinitis: Secondary | ICD-10-CM | POA: Diagnosis not present

## 2019-05-25 NOTE — Progress Notes (Signed)
HPI: Jennifer Blackburn is a 74 y.o. female who presents is referred by Dr. Moshe Cipro for evaluation of sinus complaints.. She was having recent headaches and had a recent CT scan that showed mild sinus disease in the sphenoid and posterior left ethmoid region.  I reviewed the CT scan and this showed minimal sinus disease within the sphenoid and posterior left ethmoid region.  Remaining sinuses were fairly clear.  Patient complains of thick mucus in her throat especially in the mornings.  When she coughs it up it has color to it.  She also has globus type symptoms and is on Prilosec twice daily for acid reflux.  Past Medical History:  Diagnosis Date  . Anxiety   . CAD (coronary artery disease)   . Depression   . Diabetes mellitus type II    without complication  . DJD (degenerative joint disease) of lumbar spine   . Hypercholesteremia   . Hyperlipidemia   . Hypertension    benign   . Hypothyroidism   . Thyroid cancer ([place]) 2001   Past Surgical History:  Procedure Laterality Date  . ABDOMINAL HYSTERECTOMY    . CARDIAC CATHETERIZATION    . CATARACT EXTRACTION W/PHACO Right 07/17/2012   Procedure: CATARACT EXTRACTION PHACO AND INTRAOCULAR LENS PLACEMENT (IOC);  Surgeon: Tonny Branch, MD;  Location: [facility];  Service: Ophthalmology;  Laterality: Right;  CDE:25.51  . CATARACT EXTRACTION W/PHACO Left 09/17/2016   Procedure: CATARACT EXTRACTION PHACO AND INTRAOCULAR LENS PLACEMENT LEFT EYE;  Surgeon: Tonny Branch, MD;  Location: [facility];  Service: Ophthalmology;  Laterality: Left;  CDE: 19.23  . COLONOSCOPY    . COLONOSCOPY N/A 01/15/2014   Procedure: COLONOSCOPY;  Surgeon: Daneil Dolin, MD;  Location: [facility];  Service: Endoscopy;  Laterality: N/A;  9:00 AM  . COLONOSCOPY WITH PROPOFOL N/A 01/25/2019   Procedure: COLONOSCOPY WITH PROPOFOL;  Surgeon: Daneil Dolin, MD; diverticulosis in the entire colon, otherwise normal exam.  No repeat colonoscopy due to age.  . DOPPLER ECHOCARDIOGRAPHY    .  ESOPHAGOGASTRODUODENOSCOPY (EGD) WITH PROPOFOL N/A 01/25/2019   Procedure: ESOPHAGOGASTRODUODENOSCOPY (EGD) WITH PROPOFOL;  Surgeon: Daneil Dolin, MD; normal esophagus without dilation due to inability to pass dilator beyond the hypopharynx, 1 small nonbleeding gastric ulcer s/p biopsied, otherwise normal exam.  Pathology with ulcer with reactive changes, no H. pylori, metaplasia, dysplasia, or malignancy.  Marland Kitchen JOINT REPLACEMENT  07/01/2010   left hip  . KNEE SURGERY Right    arthroscopy  . left hip replaced  07/01/2010  . SPINE SURGERY  2006   cervical  . stress dipyridamole myocardial perfusion    . THYROIDECTOMY    . TONSILLECTOMY    . VESICOVAGINAL FISTULA CLOSURE W/ TAH     Social History   Socioeconomic History  . Marital status: Married    Spouse name: Not on file  . Number of children: 2  . Years of education: 76  . Highest education level: Not on file  Occupational History  . Occupation: retired     Comment: bank  Tobacco Use  . Smoking status: Passive Smoke Exposure - Never Smoker  . Smokeless tobacco: Never Used  . Tobacco comment: Husband smokes in the home  Substance and Sexual Activity  . Alcohol use: No  . Drug use: No  . Sexual activity: Never  Other Topics Concern  . Not on file  Social History Narrative   Lives with husband, at home   No caffeine   Social Determinants of Health  Financial Resource Strain: Low Risk   . Difficulty of Paying Living Expenses: Not hard at all  Food Insecurity: No Food Insecurity  . Worried About Charity fundraiser in the Last Year: Never true  . Ran Out of Food in the Last Year: Never true  Transportation Needs: No Transportation Needs  . Lack of Transportation (Medical): No  . Lack of Transportation (Non-Medical): No  Physical Activity: Sufficiently Active  . Days of Exercise per Week: 5 days  . Minutes of Exercise per Session: 40 min  Stress: No Stress Concern Present  . Feeling of Stress : Not at all  Social  Connections: Not Isolated  . Frequency of Communication with Friends and Family: More than three times a week  . Frequency of Social Gatherings with Friends and Family: Three times a week  . Attends Religious Services: More than 4 times per year  . Active Member of Clubs or Organizations: Yes  . Attends Archivist Meetings: More than 4 times per year  . Marital Status: Married   Family History  Problem Relation Age of Onset  . Heart attack Father   . Heart failure Mother   . Asthma Daughter   . Sleep apnea Son        CPAP  . Colon cancer Neg Hx    Allergies  Allergen Reactions  . Lipitor [Atorvastatin Calcium] Other (See Comments)    Muscle aches  . Daypro [Oxaprozin] Hives  . Sulfonamide Derivatives Hives   Prior to Admission medications   Medication Sig Start Date End Date Taking? Authorizing Provider  acetaminophen (TYLENOL) 500 MG tablet Take 1,000 mg by mouth every 6 (six) hours as needed (for pain.).   Yes [provider]  aspirin EC 81 MG tablet Take 1 tablet (81 mg total) by mouth daily. 10/27/15  Yes Penumalli, Earlean Polka, MD  Cholecalciferol (VITAMIN D3) 50 MCG (2000 UT) TABS Take 2,000 Units by mouth daily.   Yes [provider]  ferrous sulfate 325 (65 FE) MG tablet Take 325 mg by mouth daily at 12 noon.    Yes [provider]  fluticasone (FLONASE) 50 MCG/ACT nasal spray Place 2 sprays into both nostrils daily. Patient taking differently: Place 2 sprays into both nostrils daily as needed for allergies.  12/07/18  Yes Perlie Mayo, NP  gabapentin (NEURONTIN) 100 MG capsule TAKE 1 CAPSULE(100 MG) BY MOUTH THREE TIMES DAILY Patient taking differently: Take 100 mg by mouth See admin instructions. Take 1 capsule (100 mg) by mouth scheduled in the morning, then take 1 capsule (100 mg) by mouth twice daily if needed for pain. 06/05/18  Yes Fayrene Helper, MD  glucose blood (ACCU-CHEK AVIVA PLUS) test strip Use as instructed two times  daily dx E11.65 11/22/18  Yes Fayrene Helper, MD  imipramine (TOFRANIL) 25 MG tablet TAKE 4 TABLETS BY MOUTH EVERY NIGHT AT BEDTIME Patient taking differently: Take 50-100 mg by mouth at bedtime.  06/26/18  Yes Fayrene Helper, MD  insulin lispro (HUMALOG) 100 UNIT/ML KwikPen Inject 8-10 units under the skin three times daily before meals. Please give Lispro, not name brand. 05/03/19  Yes Philemon Kingdom, MD  Insulin Pen Needle 32G X 4 MM MISC Use 4x a day 12/28/18  Yes Philemon Kingdom, MD  isosorbide mononitrate (IMDUR) 30 MG 24 hr tablet TAKE HALF A TABLET BY MOUTH EVERY DAY Patient taking differently: Take 15 mg by mouth daily.  12/28/18  Yes Lorretta Harp, MD  Lancets (ACCU-CHEK MULTICLIX) lancets Use as instructed three times daily dx 250.01 05/07/13  Yes Fayrene Helper, MD  levothyroxine (SYNTHROID) 150 MCG tablet Take 1 tablet (150 mcg total) by mouth daily. Patient taking differently: Take 150 mcg by mouth daily before breakfast.  12/29/18  Yes Philemon Kingdom, MD  loratadine (CLARITIN) 10 MG tablet Take 10 mg by mouth daily.    Yes [provider]  Menthol, Topical Analgesic, (ICY HOT EX) Apply 1 application topically 3 (three) times daily as needed (for back pain.).   Yes [provider]  metFORMIN (GLUCOPHAGE) 1000 MG tablet TAKE 1 TABLET TWICE A DAY 02/08/19  Yes Fayrene Helper, MD  metoprolol tartrate (LOPRESSOR) 50 MG tablet Take 1 tablet (50 mg total) by mouth 2 (two) times daily. 05/10/18  Yes Lorretta Harp, MD  pantoprazole (PROTONIX) 40 MG tablet Take 1 tablet (40 mg total) by mouth daily. 11/29/18  Yes Fayrene Helper, MD  PARoxetine (PAXIL) 20 MG tablet Take 1 tablet (20 mg total) by mouth daily. Patient reports that she takes it 1-2 times week. Patient taking differently: Take 20 mg by mouth daily.  05/01/18  Yes Fayrene Helper, MD  pravastatin (PRAVACHOL) 80 MG tablet TAKE 1 TABLET DAILY 06/23/18  Yes Fayrene Helper, MD   quinapril (ACCUPRIL) 10 MG tablet TAKE 1 TABLET DAILY 10/30/18  Yes Fayrene Helper, MD  Semaglutide,0.25 or 0.5MG /DOS, (OZEMPIC, 0.25 OR 0.5 MG/DOSE,) 2 MG/1.5ML SOPN Inject 0.5 mg into the skin once a week.   Yes [provider]  simethicone (PHAZYME) 125 MG chewable tablet Chew 1 tablet (125 mg total) by mouth every 6 (six) hours as needed for flatulence. 11/05/18  Yes Fayrene Helper, MD  TOUJEO SOLOSTAR 300 UNIT/ML SOPN INJECT 30-50 UNITS INTO THE SKIN 2 TIMES DAILY. (OVER $600) 05/02/19  Yes Philemon Kingdom, MD  verapamil (CALAN-SR) 180 MG CR tablet TAKE 1 TABLET(180 MG) BY MOUTH AT BEDTIME Patient taking differently: Take 180 mg by mouth at bedtime.  06/19/18  Yes Lorretta Harp, MD  vitamin B-12 (CYANOCOBALAMIN) 1000 MCG tablet Take 1 tablet (1,000 mcg total) by mouth daily. 08/20/14  Yes Fayrene Helper, MD     Positive ROS: Otherwise negative  All other systems have been reviewed and were otherwise negative with the exception of those mentioned in the HPI and as above.  Physical Exam: Constitutional: Alert, well-appearing, no acute distress Ears: External ears without lesions or tenderness. Ear canals are clear bilaterally with intact, clear TMs.  Nasal: External nose without lesions. Septum mildly deviated to the left..  Nasal endoscopy was performed in the office today and on nasal endoscopy she had a little bit of thin mostly clear mucus discharge from the left posterior ethmoid region and possibly sphenoid region.  Right and right nasal endoscopy mostly just clear mucus discharge was noted.  Both middle meatus regions were clear with no gross mucopurulent discharge noted no polyps noted. Oral: Lips and gums without lesions. Tongue and palate mucosa without lesions.  On exam of the posterior oropharynx she has some thick yellow postnasal drainage noted from the nasopharynx. Neck: No palpable adenopathy or masses Respiratory: Breathing comfortably  Skin: No  facial/neck lesions or rash noted.  Nasal/sinus endoscopy  Date/Time: 05/25/2019 6:38 PM Performed by: Rozetta Nunnery, MD Authorized by: Rozetta Nunnery, MD   Consent:    Consent obtained:  Verbal   Consent given by:  Patient   Risks discussed:  Pain Procedure details:  Indications: sino-nasal symptoms     Scope location: bilateral nare   Sinus:    Right middle meatus: normal     Left middle meatus: normal   Comments:     Patient with thick mucus draining from the posterior ethmoid and sphenoid region left side worse than right.  Minimal gross mucopurulent discharge although she has some thick yellow mucus in the posterior oropharynx.    Assessment: Chronic rhinitis with mild sinusitis in the sphenoid and posterior ethmoid region noted on recent CT scan of the head for headache.  Plan: Placed her on Augmentin 875 mg twice daily for the next 2 weeks.  Also prescribed her Nasacort instead of Flonase 2 sprays each nostril at night and recommend using saline nasal irrigation during the day if she notices much mucus in the posterior nasal cavity. She will follow up in 3 weeks for recheck.   Radene Journey, MD   CC:

## 2019-05-27 ENCOUNTER — Other Ambulatory Visit: Payer: Self-pay | Admitting: Internal Medicine

## 2019-05-27 ENCOUNTER — Other Ambulatory Visit: Payer: Self-pay | Admitting: Cardiovascular Disease

## 2019-05-30 ENCOUNTER — Other Ambulatory Visit: Payer: Self-pay

## 2019-05-30 ENCOUNTER — Ambulatory Visit: Payer: Medicare HMO | Attending: Internal Medicine

## 2019-05-30 DIAGNOSIS — Z23 Encounter for immunization: Secondary | ICD-10-CM

## 2019-05-30 NOTE — Progress Notes (Signed)
   Covid-19 Vaccination Clinic  Name:  Jennifer Blackburn    MRN: [id number] DOB: 20-Nov-1945  05/30/2019  Ms. Kieffer was observed post Covid-19 immunization for 30 minutes based on pre-vaccination screening without incident. She was provided with Vaccine Information Sheet and instruction to access the V-Safe system.   Ms. Bardin was instructed to call 911 with any severe reactions post vaccine: Marland Kitchen Difficulty breathing  . Swelling of face and throat  . A fast heartbeat  . A bad rash all over body  . Dizziness and weakness   Immunizations Administered    Name Date Dose VIS Date Route   Moderna COVID-19 Vaccine 05/30/2019  2:57 PM 0.5 mL 02/20/2019 Intramuscular   Manufacturer: Moderna   Lot: [id number]   [id number]

## 2019-06-02 ENCOUNTER — Other Ambulatory Visit: Payer: Self-pay | Admitting: Family Medicine

## 2019-06-04 ENCOUNTER — Other Ambulatory Visit: Payer: Self-pay | Admitting: *Deleted

## 2019-06-04 MED ORDER — PRAVASTATIN SODIUM 80 MG PO TABS: 80.0000 mg | ORAL_TABLET | Freq: Every day | ORAL | 1 refills | Status: DC

## 2019-06-07 ENCOUNTER — Encounter: Payer: Self-pay | Admitting: Family Medicine

## 2019-06-17 ENCOUNTER — Other Ambulatory Visit: Payer: Self-pay | Admitting: Cardiovascular Disease

## 2019-06-18 DIAGNOSIS — M13841 Other specified arthritis, right hand: Secondary | ICD-10-CM | POA: Diagnosis not present

## 2019-06-18 DIAGNOSIS — M79641 Pain in right hand: Secondary | ICD-10-CM | POA: Diagnosis not present

## 2019-06-18 DIAGNOSIS — M65341 Trigger finger, right ring finger: Secondary | ICD-10-CM | POA: Diagnosis not present

## 2019-06-19 ENCOUNTER — Other Ambulatory Visit: Payer: Self-pay

## 2019-06-19 ENCOUNTER — Ambulatory Visit (INDEPENDENT_AMBULATORY_CARE_PROVIDER_SITE_OTHER): Payer: Medicare HMO | Admitting: Otolaryngology

## 2019-06-19 VITALS — Temp 97.9°F

## 2019-06-19 DIAGNOSIS — J3089 Other allergic rhinitis: Secondary | ICD-10-CM | POA: Diagnosis not present

## 2019-06-19 NOTE — Progress Notes (Signed)
HPI: Jennifer Blackburn is a 74 y.o. female who returns today for evaluation of recent sinus infection.  She is doing much better.  Having minimal thick mucus discharge.  No headache.  She is continuing with Nasacort and asks about use of loratadine during the spring season..  Past Medical History:  Diagnosis Date  . Anxiety   . CAD (coronary artery disease)   . Depression   . Diabetes mellitus type II    without complication  . DJD (degenerative joint disease) of lumbar spine   . Hypercholesteremia   . Hyperlipidemia   . Hypertension    benign   . Hypothyroidism   . Thyroid cancer ([place]) 2001   Past Surgical History:  Procedure Laterality Date  . ABDOMINAL HYSTERECTOMY    . CARDIAC CATHETERIZATION    . CATARACT EXTRACTION W/PHACO Right 07/17/2012   Procedure: CATARACT EXTRACTION PHACO AND INTRAOCULAR LENS PLACEMENT (IOC);  Surgeon: Tonny Branch, MD;  Location: [facility];  Service: Ophthalmology;  Laterality: Right;  CDE:25.51  . CATARACT EXTRACTION W/PHACO Left 09/17/2016   Procedure: CATARACT EXTRACTION PHACO AND INTRAOCULAR LENS PLACEMENT LEFT EYE;  Surgeon: Tonny Branch, MD;  Location: [facility];  Service: Ophthalmology;  Laterality: Left;  CDE: 19.23  . COLONOSCOPY    . COLONOSCOPY N/A 01/15/2014   Procedure: COLONOSCOPY;  Surgeon: Daneil Dolin, MD;  [facility];  Service: Endoscopy;  Laterality: N/A;  9:00 AM  . COLONOSCOPY WITH PROPOFOL N/A 01/25/2019   Procedure: COLONOSCOPY WITH PROPOFOL;  Surgeon: Daneil Dolin, MD; diverticulosis in the entire colon, otherwise normal exam.  No repeat colonoscopy due to age.  . DOPPLER ECHOCARDIOGRAPHY    . ESOPHAGOGASTRODUODENOSCOPY (EGD) WITH PROPOFOL N/A 01/25/2019   Procedure: ESOPHAGOGASTRODUODENOSCOPY (EGD) WITH PROPOFOL;  Surgeon: Daneil Dolin, MD; normal esophagus without dilation due to inability to pass dilator beyond the hypopharynx, 1 small nonbleeding gastric ulcer s/p biopsied, otherwise normal exam.  Pathology with ulcer  with reactive changes, no H. pylori, metaplasia, dysplasia, or malignancy.  Marland Kitchen JOINT REPLACEMENT  07/01/2010   left hip  . KNEE SURGERY Right    arthroscopy  . left hip replaced  07/01/2010  . SPINE SURGERY  2006   cervical  . stress dipyridamole myocardial perfusion    . THYROIDECTOMY    . TONSILLECTOMY    . VESICOVAGINAL FISTULA CLOSURE W/ TAH     Social History   Socioeconomic History  . Marital status: Married    Spouse name: Not on file  . Number of children: 2  . Years of education: 21  . Highest education level: Not on file  Occupational History  . Occupation: retired     Comment: bank  Tobacco Use  . Smoking status: Passive Smoke Exposure - Never Smoker  . Smokeless tobacco: Never Used  . Tobacco comment: Husband smokes in the home  Substance and Sexual Activity  . Alcohol use: No  . Drug use: No  . Sexual activity: Never  Other Topics Concern  . Not on file  Social History Narrative   Lives with husband, at home   No caffeine   Social Determinants of Health   Financial Resource Strain: Low Risk   . Difficulty of Paying Living Expenses: Not hard at all  Food Insecurity: No Food Insecurity  . Worried About Charity fundraiser in the Last Year: Never true  . Ran Out of Food in the Last Year: Never true  Transportation Needs: No Transportation Needs  . Lack of Transportation (  Medical): No  . Lack of Transportation (Non-Medical): No  Physical Activity: Sufficiently Active  . Days of Exercise per Week: 5 days  . Minutes of Exercise per Session: 40 min  Stress: No Stress Concern Present  . Feeling of Stress : Not at all  Social Connections: Not Isolated  . Frequency of Communication with Friends and Family: More than three times a week  . Frequency of Social Gatherings with Friends and Family: Three times a week  . Attends Religious Services: More than 4 times per year  . Active Member of Clubs or Organizations: Yes  . Attends Archivist Meetings:  More than 4 times per year  . Marital Status: Married   Family History  Problem Relation Age of Onset  . Heart attack Father   . Heart failure Mother   . Asthma Daughter   . Sleep apnea Son        CPAP  . Colon cancer Neg Hx    Allergies  Allergen Reactions  . Lipitor [Atorvastatin Calcium] Other (See Comments)    Muscle aches  . Daypro [Oxaprozin] Hives  . Sulfonamide Derivatives Hives   Prior to Admission medications   Medication Sig Start Date End Date Taking? Authorizing Provider  acetaminophen (TYLENOL) 500 MG tablet Take 1,000 mg by mouth every 6 (six) hours as needed (for pain.).   Yes [provider]  aspirin EC 81 MG tablet Take 1 tablet (81 mg total) by mouth daily. 10/27/15  Yes Penumalli, Earlean Polka, MD  Cholecalciferol (VITAMIN D3) 50 MCG (2000 UT) TABS Take 2,000 Units by mouth daily.   Yes [provider]  ferrous sulfate 325 (65 FE) MG tablet Take 325 mg by mouth daily at 12 noon.    Yes [provider]  fluticasone (FLONASE) 50 MCG/ACT nasal spray Place 2 sprays into both nostrils daily. Patient taking differently: Place 2 sprays into both nostrils daily as needed for allergies.  12/07/18  Yes Perlie Mayo, NP  gabapentin (NEURONTIN) 100 MG capsule TAKE 1 CAPSULE(100 MG) BY MOUTH THREE TIMES DAILY Patient taking differently: Take 100 mg by mouth See admin instructions. Take 1 capsule (100 mg) by mouth scheduled in the morning, then take 1 capsule (100 mg) by mouth twice daily if needed for pain. 06/05/18  Yes Fayrene Helper, MD  glucose blood (ACCU-CHEK AVIVA PLUS) test strip Use as instructed two times daily dx E11.65 11/22/18  Yes Fayrene Helper, MD  imipramine (TOFRANIL) 25 MG tablet TAKE 4 TABLETS BY MOUTH EVERY NIGHT AT BEDTIME Patient taking differently: Take 50-100 mg by mouth at bedtime.  06/26/18  Yes Fayrene Helper, MD  insulin lispro (HUMALOG) 100 UNIT/ML KwikPen Inject 8-10 units under the skin three times daily before  meals. Please give Lispro, not name brand. 05/03/19  Yes Philemon Kingdom, MD  Insulin Pen Needle 32G X 4 MM MISC Use 4x a day 12/28/18  Yes Philemon Kingdom, MD  isosorbide mononitrate (IMDUR) 30 MG 24 hr tablet TAKE HALF A TABLET BY MOUTH EVERY DAY Patient taking differently: Take 15 mg by mouth daily.  12/28/18  Yes Lorretta Harp, MD  Lancets (ACCU-CHEK MULTICLIX) lancets Use as instructed three times daily dx 250.01 05/07/13  Yes Fayrene Helper, MD  levothyroxine (SYNTHROID) 150 MCG tablet TAKE 1 TABLET BY MOUTH EVERY DAY 05/28/19  Yes Philemon Kingdom, MD  loratadine (CLARITIN) 10 MG tablet Take 10 mg by mouth daily.    Yes [provider]  Menthol, Topical  Analgesic, (ICY HOT EX) Apply 1 application topically 3 (three) times daily as needed (for back pain.).   Yes [provider]  metFORMIN (GLUCOPHAGE) 1000 MG tablet TAKE 1 TABLET TWICE A DAY 02/08/19  Yes Fayrene Helper, MD  metoprolol tartrate (LOPRESSOR) 50 MG tablet TAKE 1 TABLET BY MOUTH TWICE A DAY 06/18/19  Yes Lorretta Harp, MD  pantoprazole (PROTONIX) 40 MG tablet Take 1 tablet (40 mg total) by mouth daily. 11/29/18  Yes Fayrene Helper, MD  PARoxetine (PAXIL) 20 MG tablet TAKE 1 TABLET BY MOUTH EVERY DAY 06/04/19  Yes Fayrene Helper, MD  pravastatin (PRAVACHOL) 80 MG tablet Take 1 tablet (80 mg total) by mouth daily. 06/04/19  Yes Fayrene Helper, MD  quinapril (ACCUPRIL) 10 MG tablet TAKE 1 TABLET DAILY 10/30/18  Yes Fayrene Helper, MD  Semaglutide,0.25 or 0.5MG /DOS, (OZEMPIC, 0.25 OR 0.5 MG/DOSE,) 2 MG/1.5ML SOPN Inject 0.5 mg into the skin once a week.   Yes [provider]  simethicone (PHAZYME) 125 MG chewable tablet Chew 1 tablet (125 mg total) by mouth every 6 (six) hours as needed for flatulence. 11/05/18  Yes Fayrene Helper, MD  TOUJEO SOLOSTAR 300 UNIT/ML SOPN INJECT 30-50 UNITS INTO THE SKIN 2 TIMES DAILY. (OVER $600) 05/02/19  Yes Philemon Kingdom, MD  verapamil  (CALAN-SR) 180 MG CR tablet TAKE 1 TABLET BY MOUTH AT BEDTIME 05/28/19  Yes Lorretta Harp, MD  vitamin B-12 (CYANOCOBALAMIN) 1000 MCG tablet Take 1 tablet (1,000 mcg total) by mouth daily. 08/20/14  Yes Fayrene Helper, MD     Positive ROS: Otherwise negative  All other systems have been reviewed and were otherwise negative with the exception of those mentioned in the HPI and as above.  Physical Exam: Constitutional: Alert, well-appearing, no acute distress Ears: External ears without lesions or tenderness. Ear canals are clear bilaterally with intact, clear TMs.  Nasal: External nose without lesions.  Anterior rhinoscopy revealed a clear middle meatus bilaterally.  Posterior nasal cavity was clear with no obvious mucopurulent discharge noted. Oral: Lips and gums without lesions. Tongue and palate mucosa without lesions. Posterior oropharynx clear.  She had a small amount of thick mucus discharge in the posterior oropharynx but this was minimal. Neck: No palpable adenopathy or masses Respiratory: Breathing comfortably  Skin: No facial/neck lesions or rash noted.  Procedures  Assessment: History of sphenoid sinusitis doing much better today. Allergic rhinitis.  Plan: Recommended to continue use of Nasacort 2 sprays each nostril at night for the next 2 to 3 months. It is okay to use loratadine as needed allergies. Recommended use of saline irrigation if she develops any thick mucus discharge that she has difficulty clearing.   Radene Journey, MD

## 2019-07-02 ENCOUNTER — Telehealth: Payer: Self-pay | Admitting: *Deleted

## 2019-07-02 ENCOUNTER — Other Ambulatory Visit: Payer: Self-pay

## 2019-07-02 NOTE — Telephone Encounter (Signed)
Pt would like to reschedule her EGD.  Can we call her when you get a chance?  [phone]

## 2019-07-02 NOTE — Telephone Encounter (Signed)
PA for EGD submitted via [employer] website. Case approved. [employer]# [id number], valid 08/21/19-09/20/19.

## 2019-07-02 NOTE — Telephone Encounter (Signed)
Called pt, EGD w/Prop w/RMR rescheduled to 08/21/19 at 9:45am.   Informed endo scheduler. Pre-op and COVID test 08/17/19. Letter mailed with new procedure instructions.

## 2019-07-05 DIAGNOSIS — I110 Hypertensive heart disease with heart failure: Secondary | ICD-10-CM | POA: Diagnosis not present

## 2019-07-05 DIAGNOSIS — I509 Heart failure, unspecified: Secondary | ICD-10-CM | POA: Diagnosis not present

## 2019-07-19 ENCOUNTER — Other Ambulatory Visit (HOSPITAL_COMMUNITY): Payer: Medicare HMO

## 2019-07-20 ENCOUNTER — Other Ambulatory Visit (HOSPITAL_COMMUNITY): Payer: Medicare HMO

## 2019-07-20 ENCOUNTER — Encounter (HOSPITAL_COMMUNITY): Payer: Medicare HMO

## 2019-07-29 ENCOUNTER — Other Ambulatory Visit: Payer: Self-pay | Admitting: Family Medicine

## 2019-08-06 ENCOUNTER — Other Ambulatory Visit: Payer: Self-pay

## 2019-08-06 ENCOUNTER — Telehealth (INDEPENDENT_AMBULATORY_CARE_PROVIDER_SITE_OTHER): Payer: Medicare HMO

## 2019-08-06 VITALS — BP 112/63 | Ht 65.0 in | Wt 184.0 lb

## 2019-08-06 DIAGNOSIS — Z0000 Encounter for general adult medical examination without abnormal findings: Secondary | ICD-10-CM | POA: Diagnosis not present

## 2019-08-06 DIAGNOSIS — Z780 Asymptomatic menopausal state: Secondary | ICD-10-CM | POA: Diagnosis not present

## 2019-08-06 NOTE — Progress Notes (Signed)
Subjective:   Jennifer Blackburn is a 74 y.o. female who presents for Medicare Annual (Subsequent) preventive examination.  Review of Systems:   Cardiac Risk Factors include: dyslipidemia;diabetes mellitus;advanced age (>24men, >31 women);obesity (BMI >30kg/m2);hypertension     Objective:     Vitals: BP 112/63   Ht 5\' 5"  (1.651 m)   Wt 184 lb (83.5 kg)   LMP 11/11/2016   BMI 30.62 kg/m   Body mass index is 30.62 kg/m.  Advanced Directives 01/25/2019 10/12/2017 01/03/2017 12/09/2016 12/08/2016 09/17/2016 02/11/2015  Does Patient Have a Medical Advance Directive? No No No No No No No  Would patient like information on creating a medical advance directive? No - Patient declined No - Patient declined Yes (MAU/Ambulatory/Procedural Areas - Information given) (No Data) No - Patient declined No - Patient declined No - patient declined information  Pre-existing out of facility DNR order (yellow form or pink MOST form) - - - - - - -    Tobacco Social History   Tobacco Use  Smoking Status Passive Smoke Exposure - Never Smoker  Smokeless Tobacco Never Used  Tobacco Comment   Husband smokes in the home     Counseling given: Not Answered Comment: Husband smokes in the home   Clinical Intake:  Pre-visit preparation completed: Yes  Pain : No/denies pain Pain Score: 0-No pain     Nutritional Status: BMI > 30  Obese Diabetes: Yes CBG done?: No Did pt. bring in CBG monitor from home?: No  How often do you need to have someone help you when you read instructions, pamphlets, or other written materials from your doctor or pharmacy?: 1 - Never     Information entered by :: Michole Lecuyer LPN  Past Medical History:  Diagnosis Date  . Anxiety   . CAD (coronary artery disease)   . Depression   . Diabetes mellitus type II    without complication  . DJD (degenerative joint disease) of lumbar spine   . Hypercholesteremia   . Hyperlipidemia   . Hypertension    benign   .  Hypothyroidism   . Thyroid cancer ([place]) 2001   Past Surgical History:  Procedure Laterality Date  . ABDOMINAL HYSTERECTOMY    . CARDIAC CATHETERIZATION    . CATARACT EXTRACTION W/PHACO Right 07/17/2012   Procedure: CATARACT EXTRACTION PHACO AND INTRAOCULAR LENS PLACEMENT (IOC);  Surgeon: Tonny Branch, MD;  Location: [facility];  Service: Ophthalmology;  Laterality: Right;  CDE:25.51  . CATARACT EXTRACTION W/PHACO Left 09/17/2016   Procedure: CATARACT EXTRACTION PHACO AND INTRAOCULAR LENS PLACEMENT LEFT EYE;  Surgeon: Tonny Branch, MD;  Location: [facility];  Service: Ophthalmology;  Laterality: Left;  CDE: 19.23  . COLONOSCOPY    . COLONOSCOPY N/A 01/15/2014   Procedure: COLONOSCOPY;  Surgeon: Daneil Dolin, MD;  Location: [facility];  Service: Endoscopy;  Laterality: N/A;  9:00 AM  . COLONOSCOPY WITH PROPOFOL N/A 01/25/2019   Procedure: COLONOSCOPY WITH PROPOFOL;  Surgeon: Daneil Dolin, MD; diverticulosis in the entire colon, otherwise normal exam.  No repeat colonoscopy due to age.  . DOPPLER ECHOCARDIOGRAPHY    . ESOPHAGOGASTRODUODENOSCOPY (EGD) WITH PROPOFOL N/A 01/25/2019   Procedure: ESOPHAGOGASTRODUODENOSCOPY (EGD) WITH PROPOFOL;  Surgeon: Daneil Dolin, MD; normal esophagus without dilation due to inability to pass dilator beyond the hypopharynx, 1 small nonbleeding gastric ulcer s/p biopsied, otherwise normal exam.  Pathology with ulcer with reactive changes, no H. pylori, metaplasia, dysplasia, or malignancy.  Marland Kitchen JOINT REPLACEMENT  07/01/2010   left  hip  . KNEE SURGERY Right    arthroscopy  . left hip replaced  07/01/2010  . SPINE SURGERY  2006   cervical  . stress dipyridamole myocardial perfusion    . THYROIDECTOMY    . TONSILLECTOMY    . VESICOVAGINAL FISTULA CLOSURE W/ TAH     Family History  Problem Relation Age of Onset  . Heart attack Father   . Heart failure Mother   . Asthma Daughter   . Sleep apnea Son        CPAP  . Colon cancer Neg Hx    Social History    Socioeconomic History  . Marital status: Married    Spouse name: Not on file  . Number of children: 2  . Years of education: 54  . Highest education level: Not on file  Occupational History  . Occupation: retired     Comment: bank  Tobacco Use  . Smoking status: Passive Smoke Exposure - Never Smoker  . Smokeless tobacco: Never Used  . Tobacco comment: Husband smokes in the home  Substance and Sexual Activity  . Alcohol use: No  . Drug use: No  . Sexual activity: Never  Other Topics Concern  . Not on file  Social History Narrative   Lives with husband, at home   No caffeine   Social Determinants of Health   Financial Resource Strain:   . Difficulty of Paying Living Expenses:   Food Insecurity: No Food Insecurity  . Worried About Charity fundraiser in the Last Year: Never true  . Ran Out of Food in the Last Year: Never true  Transportation Needs: No Transportation Needs  . Lack of Transportation (Medical): No  . Lack of Transportation (Non-Medical): No  Physical Activity: Insufficiently Active  . Days of Exercise per Week: 4 days  . Minutes of Exercise per Session: 30 min  Stress: No Stress Concern Present  . Feeling of Stress : Not at all  Social Connections: Not Isolated  . Frequency of Communication with Friends and Family: More than three times a week  . Frequency of Social Gatherings with Friends and Family: Once a week  . Attends Religious Services: More than 4 times per year  . Active Member of Clubs or Organizations: Yes  . Attends Archivist Meetings: More than 4 times per year  . Marital Status: Married    Outpatient Encounter Medications as of 08/06/2019  Medication Sig  . acetaminophen (TYLENOL) 500 MG tablet Take 1,000 mg by mouth every 6 (six) hours as needed (for pain.).  Marland Kitchen aspirin EC 81 MG tablet Take 1 tablet (81 mg total) by mouth daily.  . Cholecalciferol (VITAMIN D3) 50 MCG (2000 UT) TABS Take 2,000 Units by mouth daily.  . ferrous  sulfate 325 (65 FE) MG tablet Take 325 mg by mouth daily at 12 noon.   . fluticasone (FLONASE) 50 MCG/ACT nasal spray Place 2 sprays into both nostrils daily.  Marland Kitchen gabapentin (NEURONTIN) 100 MG capsule TAKE 1 CAPSULE(100 MG) BY MOUTH THREE TIMES DAILY (Patient taking differently: Take 100 mg by mouth See admin instructions. Take 1 capsule (100 mg) by mouth scheduled in the morning, then take 1 capsule (100 mg) by mouth twice daily if needed for pain.)  . glucose blood (ACCU-CHEK AVIVA PLUS) test strip Use as instructed two times daily dx E11.65  . imipramine (TOFRANIL) 25 MG tablet TAKE 4 TABLETS BY MOUTH AT BEDTIME (Patient taking differently: Take 50-100 mg by mouth at bedtime. )  .  insulin lispro (HUMALOG) 100 UNIT/ML KwikPen Inject 8-10 units under the skin three times daily before meals. Please give Lispro, not name brand.  . Insulin Pen Needle 32G X 4 MM MISC Use 4x a day  . isosorbide mononitrate (IMDUR) 30 MG 24 hr tablet TAKE HALF A TABLET BY MOUTH EVERY DAY (Patient taking differently: Take 15 mg by mouth daily. )  . Lancets (ACCU-CHEK MULTICLIX) lancets Use as instructed three times daily dx 250.01  . levothyroxine (SYNTHROID) 150 MCG tablet TAKE 1 TABLET BY MOUTH EVERY DAY (Patient taking differently: Take 150 mcg by mouth daily before breakfast. )  . loratadine (CLARITIN) 10 MG tablet Take 10 mg by mouth daily.   . metFORMIN (GLUCOPHAGE) 1000 MG tablet TAKE 1 TABLET TWICE A DAY (Patient taking differently: Take 1,000 mg by mouth 2 (two) times daily with a meal. )  . metoprolol tartrate (LOPRESSOR) 50 MG tablet TAKE 1 TABLET BY MOUTH TWICE A DAY  . pantoprazole (PROTONIX) 40 MG tablet Take 1 tablet (40 mg total) by mouth daily. (Patient taking differently: Take 40 mg by mouth 2 (two) times daily. )  . PARoxetine (PAXIL) 20 MG tablet TAKE 1 TABLET BY MOUTH EVERY DAY (Patient taking differently: Take 20 mg by mouth daily. )  . pravastatin (PRAVACHOL) 80 MG tablet Take 1 tablet (80 mg total) by  mouth daily.  . quinapril (ACCUPRIL) 10 MG tablet TAKE 1 TABLET DAILY (Patient taking differently: Take 10 mg by mouth daily. )  . Semaglutide,0.25 or 0.5MG /DOS, (OZEMPIC, 0.25 OR 0.5 MG/DOSE,) 2 MG/1.5ML SOPN Inject 0.5 mg into the skin once a week. Fridays  . simethicone (PHAZYME) 125 MG chewable tablet Chew 1 tablet (125 mg total) by mouth every 6 (six) hours as needed for flatulence.  . TOUJEO SOLOSTAR 300 UNIT/ML SOPN INJECT 30-50 UNITS INTO THE SKIN 2 TIMES DAILY. (OVER $600) (Patient taking differently: Inject 30-50 Units into the skin See admin instructions. 30 units in the morning, 50 units at bedtime)  . verapamil (CALAN-SR) 180 MG CR tablet TAKE 1 TABLET BY MOUTH AT BEDTIME (Patient taking differently: Take 180 mg by mouth at bedtime. )  . vitamin B-12 (CYANOCOBALAMIN) 1000 MCG tablet Take 1 tablet (1,000 mcg total) by mouth daily.   No facility-administered encounter medications on file as of 08/06/2019.    Activities of Daily Living In your present state of health, do you have any difficulty performing the following activities: 08/06/2019  Hearing? N  Vision? N  Difficulty concentrating or making decisions? N  Walking or climbing stairs? N  Dressing or bathing? N  Doing errands, shopping? N  Preparing Food and eating ? N  Using the Toilet? N  In the past six months, have you accidently leaked urine? N  Do you have problems with loss of bowel control? N  Managing your Medications? N  Managing your Finances? N  Housekeeping or managing your Housekeeping? N  Some recent data might be hidden    Patient Care Team: Fayrene Helper, MD as PCP - General Gwenlyn Found Pearletha Forge, MD as PCP - Cardiology (Cardiology) Sherlynn Stalls, MD (Ophthalmology) Lorretta Harp, MD as Consulting Physician (Cardiology) Ronnald Collum, Lourdes Sledge, MD as Attending Physician (Endocrinology) Gala Romney Cristopher Estimable, MD as Consulting Physician (Gastroenterology) Cassandria Anger, MD as Consulting Physician  (Endocrinology)    Assessment:   This is a routine wellness examination for Hydi.  Exercise Activities and Dietary recommendations Current Exercise Habits: Home exercise routine, Time (Minutes): 30, Frequency (Times/Week): 4, Weekly Exercise (  Minutes/Week): 120  Goals    . Blood Pressure < 140/90    . Exercise 150 minutes per week (moderate activity)    . HEMOGLOBIN A1C < 7.0    . LDL CALC < 70    . LIFESTYLE - DECREASE FALLS RISK       Fall Risk Fall Risk  08/06/2019 04/03/2019 02/07/2019 12/18/2018 12/07/2018  Falls in the past year? 1 1 0 0 0  Number falls in past yr: 0 1 0 0 0  Injury with Fall? 0 0 0 0 0  Comment - - - - -  Risk Factor Category  - - - - -  Risk for fall due to : - - - - -  Risk for fall due to: Comment - - - - -  Follow up - - - - -  Comment - - - - -   Is the patient's home free of loose throw rugs in walkways, pet beds, electrical cords, etc?   yes      Grab bars in the bathroom? yes      Handrails on the stairs?   yes      Adequate lighting?   yes  Timed Get Up and Go performed: unable due to virtual visit   Depression Screen PHQ 2/9 Scores 08/06/2019 02/07/2019 12/18/2018 12/07/2018  PHQ - 2 Score 0 0 0 0  PHQ- 9 Score 4 7 - -     Cognitive Function     6CIT Screen 08/06/2019 08/02/2018 01/03/2017  What Year? 0 points 0 points 0 points  What month? 0 points 0 points 0 points  What time? 0 points 0 points 0 points  Count back from 20 0 points 0 points 0 points  Months in reverse 0 points 0 points 0 points  Repeat phrase 0 points 0 points 0 points  Total Score 0 0 0    Immunization History  Administered Date(s) Administered  . Fluad Quad(high Dose 65+) 12/18/2018  . Influenza Split 03/10/2012  . Influenza Whole 01/04/2007  . Influenza,inj,Quad PF,6+ Mos 11/28/2012, 12/31/2013, 01/31/2017, 01/10/2018  . Influenza,inj,quad, With Preservative 12/20/2016  . Influenza-Unspecified 02/09/2016  . Moderna SARS-COVID-2 Vaccination 04/29/2019,  05/30/2019  . Pneumococcal Conjugate-13 07/15/2014  . Pneumococcal Polysaccharide-23 02/17/2004, 09/07/2012  . Tdap 12/21/2010    Qualifies for Shingles Vaccine? Can get at pharmacy   Screening Tests Health Maintenance  Topic Date Due  . FOOT EXAM  01/16/2019  . OPHTHALMOLOGY EXAM  05/24/2019  . INFLUENZA VACCINE  10/21/2019  . HEMOGLOBIN A1C  10/31/2019  . TETANUS/TDAP  12/20/2020  . MAMMOGRAM  03/21/2021  . COLONOSCOPY  01/24/2029  . DEXA SCAN  Completed  . COVID-19 Vaccine  Completed  . Hepatitis C Screening  Completed  . PNA vac Low Risk Adult  Completed    Cancer Screenings: Lung: Low Dose CT Chest recommended if Age 74-80 years, 30 pack-year currently smoking OR have quit w/in 15years. Patient does not qualify. Breast:  Up to date on Mammogram? Yes   Up to date of Bone Density/Dexa? No- order placed  Colorectal: up to date   Additional Screenings: : Hepatitis C Screening: completed      Plan:     I have personally reviewed and noted the following in the patient's chart:   . Medical and social history . Use of alcohol, tobacco or illicit drugs  . Current medications and supplements . Functional ability and status . Nutritional status . Physical activity . Advanced directives . List of  other physicians . Hospitalizations, surgeries, and ER visits in previous 12 months . Vitals . Screenings to include cognitive, depression, and falls . Referrals and appointments  In addition, I have reviewed and discussed with patient certain preventive protocols, quality metrics, and best practice recommendations. A written personalized care plan for preventive services as well as general preventive health recommendations were provided to patient.     Kate Sable, LPN, LPN  [phone]

## 2019-08-06 NOTE — Patient Instructions (Addendum)
Jennifer Blackburn , Thank you for taking time to come for your Medicare Wellness Visit. I appreciate your ongoing commitment to your health goals. Please review the following plan we discussed and let me know if I can assist you in the future.   Screening recommendations/referrals: Colonoscopy: up to date  Mammogram: up to date  Bone Density: due- scheduled for May 25 at 2:15pm Recommended yearly ophthalmology/optometry visit for glaucoma screening and checkup Recommended yearly dental visit for hygiene and checkup  Vaccinations: Influenza vaccine: up to date  Pneumococcal vaccine: up to date  Tdap vaccine: up to date  Shingles vaccine: Can check with the pharmacy for vaccines       Preventive Care 13 Years and Older, Female Preventive care refers to lifestyle choices and visits with your health care provider that can promote health and wellness. What does preventive care include?  A yearly physical exam. This is also called an annual well check.  Dental exams once or twice a year.  Routine eye exams. Ask your health care provider how often you should have your eyes checked.  Personal lifestyle choices, including:  Daily care of your teeth and gums.  Regular physical activity.  Eating a healthy diet.  Avoiding tobacco and drug use.  Limiting alcohol use.  Practicing safe sex.  Taking low-dose aspirin every day.  Taking vitamin and mineral supplements as recommended by your health care provider. What happens during an annual well check? The services and screenings done by your health care provider during your annual well check will depend on your age, overall health, lifestyle risk factors, and family history of disease. Counseling  Your health care provider may ask you questions about your:  Alcohol use.  Tobacco use.  Drug use.  Emotional well-being.  Home and relationship well-being.  Sexual activity.  Eating habits.  History of falls.  Memory and  ability to understand (cognition).  Work and work Statistician.  Reproductive health. Screening  You may have the following tests or measurements:  Height, weight, and BMI.  Blood pressure.  Lipid and cholesterol levels. These may be checked every 5 years, or more frequently if you are over 70 years old.  Skin check.  Lung cancer screening. You may have this screening every year starting at age 46 if you have a 30-pack-year history of smoking and currently smoke or have quit within the past 15 years.  Fecal occult blood test (FOBT) of the stool. You may have this test every year starting at age 69.  Flexible sigmoidoscopy or colonoscopy. You may have a sigmoidoscopy every 5 years or a colonoscopy every 10 years starting at age 35.  Hepatitis C blood test.  Hepatitis B blood test.  Sexually transmitted disease (STD) testing.  Diabetes screening. This is done by checking your blood sugar (glucose) after you have not eaten for a while (fasting). You may have this done every 1-3 years.  Bone density scan. This is done to screen for osteoporosis. You may have this done starting at age 59.  Mammogram. This may be done every 1-2 years. Talk to your health care provider about how often you should have regular mammograms. Talk with your health care provider about your test results, treatment options, and if necessary, the need for more tests. Vaccines  Your health care provider may recommend certain vaccines, such as:  Influenza vaccine. This is recommended every year.  Tetanus, diphtheria, and acellular pertussis (Tdap, Td) vaccine. You may need a Td booster every 10 years.  Zoster vaccine. You may need this after age 25.  Pneumococcal 13-valent conjugate (PCV13) vaccine. One dose is recommended after age 54.  Pneumococcal polysaccharide (PPSV23) vaccine. One dose is recommended after age 19. Talk to your health care provider about which screenings and vaccines you need and how  often you need them. This information is not intended to replace advice given to you by your health care provider. Make sure you discuss any questions you have with your health care provider. Document Released: 04/04/2015 Document Revised: 11/26/2015 Document Reviewed: 01/07/2015 Elsevier Interactive Patient Education  2017 [place] Prevention in the Home Falls can cause injuries. They can happen to people of all ages. There are many things you can do to make your home safe and to help prevent falls. What can I do on the outside of my home?  Regularly fix the edges of walkways and driveways and fix any cracks.  Remove anything that might make you trip as you walk through a door, such as a raised step or threshold.  Trim any bushes or trees on the path to your home.  Use bright outdoor lighting.  Clear any walking paths of anything that might make someone trip, such as rocks or tools.  Regularly check to see if handrails are loose or broken. Make sure that both sides of any steps have handrails.  Any raised decks and porches should have guardrails on the edges.  Have any leaves, snow, or ice cleared regularly.  Use sand or salt on walking paths during winter.  Clean up any spills in your garage right away. This includes oil or grease spills. What can I do in the bathroom?  Use night lights.  Install grab bars by the toilet and in the tub and shower. Do not use towel bars as grab bars.  Use non-skid mats or decals in the tub or shower.  If you need to sit down in the shower, use a plastic, non-slip stool.  Keep the floor dry. Clean up any water that spills on the floor as soon as it happens.  Remove soap buildup in the tub or shower regularly.  Attach bath mats securely with double-sided non-slip rug tape.  Do not have throw rugs and other things on the floor that can make you trip. What can I do in the bedroom?  Use night lights.  Make sure that you have a  light by your bed that is easy to reach.  Do not use any sheets or blankets that are too big for your bed. They should not hang down onto the floor.  Have a firm chair that has side arms. You can use this for support while you get dressed.  Do not have throw rugs and other things on the floor that can make you trip. What can I do in the kitchen?  Clean up any spills right away.  Avoid walking on wet floors.  Keep items that you use a lot in easy-to-reach places.  If you need to reach something above you, use a strong step stool that has a grab bar.  Keep electrical cords out of the way.  Do not use floor polish or wax that makes floors slippery. If you must use wax, use non-skid floor wax.  Do not have throw rugs and other things on the floor that can make you trip. What can I do with my stairs?  Do not leave any items on the stairs.  Make sure that there are  handrails on both sides of the stairs and use them. Fix handrails that are broken or loose. Make sure that handrails are as long as the stairways.  Check any carpeting to make sure that it is firmly attached to the stairs. Fix any carpet that is loose or worn.  Avoid having throw rugs at the top or bottom of the stairs. If you do have throw rugs, attach them to the floor with carpet tape.  Make sure that you have a light switch at the top of the stairs and the bottom of the stairs. If you do not have them, ask someone to add them for you. What else can I do to help prevent falls?  Wear shoes that:  Do not have high heels.  Have rubber bottoms.  Are comfortable and fit you well.  Are closed at the toe. Do not wear sandals.  If you use a stepladder:  Make sure that it is fully opened. Do not climb a closed stepladder.  Make sure that both sides of the stepladder are locked into place.  Ask someone to hold it for you, if possible.  Clearly mark and make sure that you can see:  Any grab bars or handrails.   First and last steps.  Where the edge of each step is.  Use tools that help you move around (mobility aids) if they are needed. These include:  Canes.  Walkers.  Scooters.  Crutches.  Turn on the lights when you go into a dark area. Replace any light bulbs as soon as they burn out.  Set up your furniture so you have a clear path. Avoid moving your furniture around.  If any of your floors are uneven, fix them.  If there are any pets around you, be aware of where they are.  Review your medicines with your doctor. Some medicines can make you feel dizzy. This can increase your chance of falling. Ask your doctor what other things that you can do to help prevent falls. This information is not intended to replace advice given to you by your health care provider. Make sure you discuss any questions you have with your health care provider. Document Released: 01/02/2009 Document Revised: 08/14/2015 Document Reviewed: 04/12/2014 Elsevier Interactive Patient Education  2017 [employer].

## 2019-08-07 ENCOUNTER — Encounter: Payer: Medicare Other | Admitting: Family Medicine

## 2019-08-13 ENCOUNTER — Other Ambulatory Visit: Payer: Self-pay | Admitting: Family Medicine

## 2019-08-13 NOTE — Patient Instructions (Signed)
Jennifer Blackburn  08/13/2019     @PREFPERIOPPHARMACY @   Your procedure is scheduled on 08/21/2019.  Report to [facility] at  [place]  A.M.  Call this number if you have problems the morning of surgery:  [phone]   Remember:  Follow the diet instructions given to you by Dr Roseanne Kaufman office.                         Take these medicines the morning of surgery with A SIP OF WATER  Gabapentin, isosorbide, levothyroxine, metoprolol, protonix, paxil,verapamil. Take 25 units of your toujeo the night before your procedure. DO NOT take any medications for diabetes the morning of your procedure.    Do not wear jewelry, make-up or nail polish.  Do not wear lotions, powders, or perfumes. Please wear deodorant and brush your teeth.  Do not shave 48 hours prior to surgery.  Men may shave face and neck.  Do not bring valuables to the hospital.  [facility] is not responsible for any belongings or valuables.  Contacts, dentures or bridgework may not be worn into surgery.  Leave your suitcase in the car.  After surgery it may be brought to your room.  For patients admitted to the hospital, discharge time will be determined by your treatment team.  Patients discharged the day of surgery will not be allowed to drive home.   Name and phone number of your driver:   family Special instructions:  DO NOT smoke the morning of your procedure.  Please read over the following fact sheets that you were given. Anesthesia Post-op Instructions and Care and Recovery After Surgery       Upper Endoscopy, Adult, Care After This sheet gives you information about how to care for yourself after your procedure. Your health care provider may also give you more specific instructions. If you have problems or questions, contact your health care provider. What can I expect after the procedure? After the procedure, it is common to have:  A sore throat.  Mild stomach pain or  discomfort.  Bloating.  Nausea. Follow these instructions at home:   Follow instructions from your health care provider about what to eat or drink after your procedure.  Return to your normal activities as told by your health care provider. Ask your health care provider what activities are safe for you.  Take over-the-counter and prescription medicines only as told by your health care provider.  Do not drive for 24 hours if you were given a sedative during your procedure.  Keep all follow-up visits as told by your health care provider. This is important. Contact a health care provider if you have:  A sore throat that lasts longer than one day.  Trouble swallowing. Get help right away if:  You vomit blood or your vomit looks like coffee grounds.  You have: ? A fever. ? Bloody, black, or tarry stools. ? A severe sore throat or you cannot swallow. ? Difficulty breathing. ? Severe pain in your chest or abdomen. Summary  After the procedure, it is common to have a sore throat, mild stomach discomfort, bloating, and nausea.  Do not drive for 24 hours if you were given a sedative during the procedure.  Follow instructions from your health care provider about what to eat or drink after your procedure.  Return to your normal activities as told by your health care provider. This information is not intended  to replace advice given to you by your health care provider. Make sure you discuss any questions you have with your health care provider. Document Revised: 08/30/2017 Document Reviewed: 08/08/2017 Elsevier Patient Education  2020 [place] After These instructions provide you with information about caring for yourself after your procedure. Your health care provider may also give you more specific instructions. Your treatment has been planned according to current medical practices, but problems sometimes occur. Call your health care provider if  you have any problems or questions after your procedure. What can I expect after the procedure? After your procedure, you may:  Feel sleepy for several hours.  Feel clumsy and have poor balance for several hours.  Feel forgetful about what happened after the procedure.  Have poor judgment for several hours.  Feel nauseous or vomit.  Have a sore throat if you had a breathing tube during the procedure. Follow these instructions at home: For at least 24 hours after the procedure:      Have a responsible adult stay with you. It is important to have someone help care for you until you are awake and alert.  Rest as needed.  Do not: ? Participate in activities in which you could fall or become injured. ? Drive. ? Use heavy machinery. ? Drink alcohol. ? Take sleeping pills or medicines that cause drowsiness. ? Make important decisions or sign legal documents. ? Take care of children on your own. Eating and drinking  Follow the diet that is recommended by your health care provider.  If you vomit, drink water, juice, or soup when you can drink without vomiting.  Make sure you have little or no nausea before eating solid foods. General instructions  Take over-the-counter and prescription medicines only as told by your health care provider.  If you have sleep apnea, surgery and certain medicines can increase your risk for breathing problems. Follow instructions from your health care provider about wearing your sleep device: ? Anytime you are sleeping, including during daytime naps. ? While taking prescription pain medicines, sleeping medicines, or medicines that make you drowsy.  If you smoke, do not smoke without supervision.  Keep all follow-up visits as told by your health care provider. This is important. Contact a health care provider if:  You keep feeling nauseous or you keep vomiting.  You feel light-headed.  You develop a rash.  You have a fever. Get help right  away if:  You have trouble breathing. Summary  For several hours after your procedure, you may feel sleepy and have poor judgment.  Have a responsible adult stay with you for at least 24 hours or until you are awake and alert. This information is not intended to replace advice given to you by your health care provider. Make sure you discuss any questions you have with your health care provider. Document Revised: 06/06/2017 Document Reviewed: 06/29/2015 Elsevier Patient Education  [place].

## 2019-08-14 ENCOUNTER — Ambulatory Visit (HOSPITAL_COMMUNITY)
Admission: RE | Admit: 2019-08-14 | Discharge: 2019-08-14 | Disposition: A | Discharge status: Home / Self Care | Payer: Medicare HMO | Source: Ambulatory Visit | Attending: Family Medicine | Admitting: Family Medicine

## 2019-08-14 ENCOUNTER — Other Ambulatory Visit: Payer: Self-pay

## 2019-08-14 DIAGNOSIS — Z780 Asymptomatic menopausal state: Secondary | ICD-10-CM

## 2019-08-14 DIAGNOSIS — Z13820 Encounter for screening for osteoporosis: Secondary | ICD-10-CM | POA: Diagnosis not present

## 2019-08-15 ENCOUNTER — Encounter (HOSPITAL_COMMUNITY)
Admission: RE | Admit: 2019-08-15 | Discharge: 2019-08-15 | Disposition: A | Discharge status: Home / Self Care | Payer: Medicare HMO | Source: Ambulatory Visit | Attending: Internal Medicine | Admitting: Internal Medicine

## 2019-08-15 ENCOUNTER — Encounter (HOSPITAL_COMMUNITY): Payer: Self-pay

## 2019-08-15 ENCOUNTER — Other Ambulatory Visit: Payer: Self-pay

## 2019-08-15 ENCOUNTER — Encounter: Payer: Self-pay | Admitting: Family Medicine

## 2019-08-15 DIAGNOSIS — Z01812 Encounter for preprocedural laboratory examination: Secondary | ICD-10-CM | POA: Diagnosis not present

## 2019-08-15 LAB — BASIC METABOLIC PANEL
Anion gap: 13 (ref 5–15)
BUN: 14 mg/dL (ref 8–23)
CO2: 27 mmol/L (ref 22–32)
Calcium: 8.5 mg/dL — ABNORMAL LOW (ref 8.9–10.3)
Chloride: 96 mmol/L — ABNORMAL LOW (ref 98–111)
Creatinine, Ser: 0.79 mg/dL (ref 0.44–1.00)
GFR calc Af Amer: >60 mL/min (ref >60)
GFR calc non Af Amer: >60 mL/min (ref >60)
Glucose, Bld: 253 mg/dL — ABNORMAL HIGH (ref 70–99)
Potassium: 3.6 mmol/L (ref 3.5–5.1)
Sodium: 136 mmol/L (ref 135–145)

## 2019-08-16 NOTE — Pre-Procedure Instructions (Signed)
Called patient and left a VM about COVID test to be done in [place] at [facility] @ [phone] 28/2021. I also left her a message form Dr Charna Elizabeth, he wants her to take her full dose of Toujeo( 50 units). the night before her procedure. Left my number for her to call back for questions or concerns.

## 2019-08-17 ENCOUNTER — Other Ambulatory Visit (HOSPITAL_COMMUNITY): Payer: Medicare HMO

## 2019-08-17 ENCOUNTER — Encounter (HOSPITAL_COMMUNITY): Payer: Medicare HMO

## 2019-08-17 ENCOUNTER — Other Ambulatory Visit: Payer: Self-pay

## 2019-08-17 ENCOUNTER — Other Ambulatory Visit (HOSPITAL_COMMUNITY)
Admission: RE | Admit: 2019-08-17 | Discharge: 2019-08-17 | Disposition: A | Discharge status: Home / Self Care | Payer: Medicare HMO | Source: Ambulatory Visit | Attending: Internal Medicine | Admitting: Internal Medicine

## 2019-08-17 ENCOUNTER — Other Ambulatory Visit
Admission: RE | Admit: 2019-08-17 | Discharge: 2019-08-17 | Disposition: A | Discharge status: Home / Self Care | Payer: Medicare HMO | Source: Ambulatory Visit | Attending: Internal Medicine | Admitting: Internal Medicine

## 2019-08-17 DIAGNOSIS — Z20822 Contact with and (suspected) exposure to covid-19: Secondary | ICD-10-CM | POA: Diagnosis not present

## 2019-08-17 DIAGNOSIS — Z01812 Encounter for preprocedural laboratory examination: Secondary | ICD-10-CM | POA: Insufficient documentation

## 2019-08-18 ENCOUNTER — Other Ambulatory Visit: Payer: Self-pay | Admitting: Family Medicine

## 2019-08-18 LAB — SARS CORONAVIRUS 2 (TAT 6-24 HRS): SARS Coronavirus 2: NEGATIVE

## 2019-08-18 NOTE — OR Nursing (Signed)
Covid test was done in [place], per Mrs. Math her results should be in her MyChart by this weekend. Waiting on result.  Also told her that Dr. Charna Elizabeth wants her to take her full dose of Toujeo ( 50 units ) the night before her procedure.  Verbalized understanding.

## 2019-08-19 ENCOUNTER — Other Ambulatory Visit: Payer: Self-pay | Admitting: Cardiovascular Disease

## 2019-08-21 ENCOUNTER — Ambulatory Visit (HOSPITAL_COMMUNITY): Payer: Medicare HMO | Admitting: Anesthesiology

## 2019-08-21 ENCOUNTER — Ambulatory Visit (HOSPITAL_COMMUNITY)
Admission: RE | Admit: 2019-08-21 | Discharge: 2019-08-21 | Disposition: A | Discharge status: Home / Self Care | Payer: Medicare HMO | Attending: Internal Medicine | Admitting: Internal Medicine

## 2019-08-21 ENCOUNTER — Encounter (HOSPITAL_COMMUNITY): Payer: Self-pay | Admitting: Internal Medicine

## 2019-08-21 ENCOUNTER — Encounter (HOSPITAL_COMMUNITY)
Admission: RE | Disposition: A | Discharge status: Home / Self Care | Payer: Self-pay | Source: Home / Self Care | Attending: Internal Medicine

## 2019-08-21 ENCOUNTER — Other Ambulatory Visit: Payer: Self-pay

## 2019-08-21 ENCOUNTER — Encounter: Payer: Self-pay | Admitting: Internal Medicine

## 2019-08-21 DIAGNOSIS — F419 Anxiety disorder, unspecified: Secondary | ICD-10-CM | POA: Insufficient documentation

## 2019-08-21 DIAGNOSIS — K219 Gastro-esophageal reflux disease without esophagitis: Secondary | ICD-10-CM | POA: Insufficient documentation

## 2019-08-21 DIAGNOSIS — Z794 Long term (current) use of insulin: Secondary | ICD-10-CM | POA: Diagnosis not present

## 2019-08-21 DIAGNOSIS — Z7982 Long term (current) use of aspirin: Secondary | ICD-10-CM | POA: Diagnosis not present

## 2019-08-21 DIAGNOSIS — E119 Type 2 diabetes mellitus without complications: Secondary | ICD-10-CM | POA: Diagnosis not present

## 2019-08-21 DIAGNOSIS — E7800 Pure hypercholesterolemia, unspecified: Secondary | ICD-10-CM | POA: Insufficient documentation

## 2019-08-21 DIAGNOSIS — Z85850 Personal history of malignant neoplasm of thyroid: Secondary | ICD-10-CM | POA: Diagnosis not present

## 2019-08-21 DIAGNOSIS — M47816 Spondylosis without myelopathy or radiculopathy, lumbar region: Secondary | ICD-10-CM | POA: Diagnosis not present

## 2019-08-21 DIAGNOSIS — I2510 Atherosclerotic heart disease of native coronary artery without angina pectoris: Secondary | ICD-10-CM | POA: Insufficient documentation

## 2019-08-21 DIAGNOSIS — Z8719 Personal history of other diseases of the digestive system: Secondary | ICD-10-CM | POA: Diagnosis not present

## 2019-08-21 DIAGNOSIS — G709 Myoneural disorder, unspecified: Secondary | ICD-10-CM | POA: Insufficient documentation

## 2019-08-21 DIAGNOSIS — F329 Major depressive disorder, single episode, unspecified: Secondary | ICD-10-CM | POA: Diagnosis not present

## 2019-08-21 DIAGNOSIS — Z8711 Personal history of peptic ulcer disease: Secondary | ICD-10-CM | POA: Insufficient documentation

## 2019-08-21 DIAGNOSIS — Z79890 Hormone replacement therapy (postmenopausal): Secondary | ICD-10-CM | POA: Diagnosis not present

## 2019-08-21 DIAGNOSIS — R1310 Dysphagia, unspecified: Secondary | ICD-10-CM | POA: Diagnosis not present

## 2019-08-21 DIAGNOSIS — K279 Peptic ulcer, site unspecified, unspecified as acute or chronic, without hemorrhage or perforation: Secondary | ICD-10-CM

## 2019-08-21 DIAGNOSIS — Z09 Encounter for follow-up examination after completed treatment for conditions other than malignant neoplasm: Secondary | ICD-10-CM | POA: Insufficient documentation

## 2019-08-21 DIAGNOSIS — E1151 Type 2 diabetes mellitus with diabetic peripheral angiopathy without gangrene: Secondary | ICD-10-CM | POA: Diagnosis not present

## 2019-08-21 DIAGNOSIS — E785 Hyperlipidemia, unspecified: Secondary | ICD-10-CM | POA: Insufficient documentation

## 2019-08-21 DIAGNOSIS — E890 Postprocedural hypothyroidism: Secondary | ICD-10-CM | POA: Diagnosis not present

## 2019-08-21 DIAGNOSIS — K289 Gastrojejunal ulcer, unspecified as acute or chronic, without hemorrhage or perforation: Secondary | ICD-10-CM

## 2019-08-21 DIAGNOSIS — Z79899 Other long term (current) drug therapy: Secondary | ICD-10-CM | POA: Diagnosis not present

## 2019-08-21 DIAGNOSIS — Z96642 Presence of left artificial hip joint: Secondary | ICD-10-CM | POA: Diagnosis not present

## 2019-08-21 DIAGNOSIS — I10 Essential (primary) hypertension: Secondary | ICD-10-CM | POA: Diagnosis not present

## 2019-08-21 HISTORY — PX: ESOPHAGOGASTRODUODENOSCOPY (EGD) WITH PROPOFOL: SHX5813

## 2019-08-21 LAB — GLUCOSE, CAPILLARY: Glucose-Capillary: 94 mg/dL (ref 70–99)

## 2019-08-21 SURGERY — ESOPHAGOGASTRODUODENOSCOPY (EGD) WITH PROPOFOL
Anesthesia: General

## 2019-08-21 MED ORDER — LIDOCAINE VISCOUS HCL 2 % MT SOLN
OROMUCOSAL | Status: DC | PRN
  Administered 2019-08-21: 1 via OROMUCOSAL

## 2019-08-21 MED ORDER — PROPOFOL 500 MG/50ML IV EMUL
INTRAVENOUS | Status: DC | PRN
  Administered 2019-08-21: 100 ug/kg/min via INTRAVENOUS

## 2019-08-21 MED ORDER — LIDOCAINE VISCOUS HCL 2 % MT SOLN
OROMUCOSAL | Status: AC
  Filled 2019-08-21: qty 15

## 2019-08-21 MED ORDER — CHLORHEXIDINE GLUCONATE CLOTH 2 % EX PADS: 6.0000 | MEDICATED_PAD | Freq: Once | CUTANEOUS | Status: DC

## 2019-08-21 MED ORDER — PROPOFOL 10 MG/ML IV BOLUS
INTRAVENOUS | Status: DC | PRN
  Administered 2019-08-21: 50 ug via INTRAVENOUS

## 2019-08-21 MED ORDER — LACTATED RINGERS IV SOLN: Freq: Once | INTRAVENOUS | Status: AC

## 2019-08-21 MED ORDER — LIDOCAINE HCL (CARDIAC) PF 100 MG/5ML IV SOSY
PREFILLED_SYRINGE | INTRAVENOUS | Status: DC | PRN
  Administered 2019-08-21: 50 mg via INTRATRACHEAL

## 2019-08-21 MED ORDER — KETAMINE HCL 50 MG/5ML IJ SOSY
PREFILLED_SYRINGE | INTRAMUSCULAR | Status: AC
  Filled 2019-08-21: qty 5

## 2019-08-21 MED ORDER — LACTATED RINGERS IV SOLN: INTRAVENOUS | Status: DC | PRN

## 2019-08-21 MED ORDER — LIDOCAINE VISCOUS HCL 2 % MT SOLN: 15.0000 mL | Freq: Once | OROMUCOSAL | Status: DC

## 2019-08-21 MED ORDER — GLYCOPYRROLATE 0.2 MG/ML IJ SOLN
INTRAMUSCULAR | Status: DC | PRN
  Administered 2019-08-21: .2 mg via INTRAVENOUS

## 2019-08-21 MED ORDER — KETAMINE HCL 10 MG/ML IJ SOLN
INTRAMUSCULAR | Status: DC | PRN
  Administered 2019-08-21: 20 mg via INTRAVENOUS

## 2019-08-21 NOTE — Transfer of Care (Signed)
Immediate Anesthesia Transfer of Care Note  Patient: LAKEITHIA JOECKEL  Procedure(s) Performed: ESOPHAGOGASTRODUODENOSCOPY (EGD) WITH PROPOFOL (N/A )  Patient Location: PACU  Anesthesia Type:General  Level of Consciousness: awake, alert  and oriented  Airway & Oxygen Therapy: Patient Spontanous Breathing  Post-op Assessment: Report given to RN, Post -op Vital signs reviewed and stable and Patient moving all extremities X 4  Post vital signs: Reviewed and stable  Last Vitals:  Vitals Value Taken Time  BP    Temp    Pulse 80 08/21/19 1037  Resp 16 08/21/19 1037  SpO2 100 % 08/21/19 1037  Vitals shown include unvalidated device data.  Last Pain:  Vitals:   08/21/19 0836  TempSrc: Oral  PainSc: 0-No pain      Patients Stated Pain Goal: 7 ([id number])  Complications: No apparent anesthesia complications

## 2019-08-21 NOTE — Discharge Instructions (Signed)
EGD Discharge instructions Please read the instructions outlined below and refer to this sheet in the next few weeks. These discharge instructions provide you with general information on caring for yourself after you leave the hospital. Your doctor may also give you specific instructions. While your treatment has been planned according to the most current medical practices available, unavoidable complications occasionally occur. If you have any problems or questions after discharge, please call your doctor. ACTIVITY  You may resume your regular activity but move at a slower pace for the next 24 hours.   Take frequent rest periods for the next 24 hours.   Walking will help expel (get rid of) the air and reduce the bloated feeling in your abdomen.   No driving for 24 hours (because of the anesthesia (medicine) used during the test).   You may shower.   Do not sign any important legal documents or operate any machinery for 24 hours (because of the anesthesia used during the test).  NUTRITION  Drink plenty of fluids.   You may resume your normal diet.   Begin with a light meal and progress to your normal diet.   Avoid alcoholic beverages for 24 hours or as instructed by your caregiver.  MEDICATIONS  You may resume your normal medications unless your caregiver tells you otherwise.  WHAT YOU CAN EXPECT TODAY  You may experience abdominal discomfort such as a feeling of fullness or gas pains.  FOLLOW-UP  Your doctor will discuss the results of your test with you.  SEEK IMMEDIATE MEDICAL ATTENTION IF ANY OF THE FOLLOWING OCCUR:  Excessive nausea (feeling sick to your stomach) and/or vomiting.   Severe abdominal pain and distention (swelling).   Trouble swallowing.   Temperature over 101 F (37.8 C).   Rectal bleeding or vomiting of blood.    Ulcer has completely healed  Office visit with [place] in 2 months  Continue Protonix 40 mg twice daily for now; we may have you  decrease this to once daily when you return for your next office visit  At patient request, I called [facility]ett Corrente at [phone] and reviewed results     Monitored Anesthesia Care, Care After These instructions provide you with information about caring for yourself after your procedure. Your health care provider may also give you more specific instructions. Your treatment has been planned according to current medical practices, but problems sometimes occur. Call your health care provider if you have any problems or questions after your procedure. What can I expect after the procedure? After your procedure, you may:  Feel sleepy for several hours.  Feel clumsy and have poor balance for several hours.  Feel forgetful about what happened after the procedure.  Have poor judgment for several hours.  Feel nauseous or vomit.  Have a sore throat if you had a breathing tube during the procedure. Follow these instructions at home: For at least 24 hours after the procedure:      Have a responsible adult stay with you. It is important to have someone help care for you until you are awake and alert.  Rest as needed.  Do not: ? Participate in activities in which you could fall or become injured. ? Drive. ? Use heavy machinery. ? Drink alcohol. ? Take sleeping pills or medicines that cause drowsiness. ? Make important decisions or sign legal documents. ? Take care of children on your own. Eating and drinking  Follow the diet that is recommended by your health care provider.  If you vomit, drink water, juice, or soup when you can drink without vomiting.  Make sure you have little or no nausea before eating solid foods. General instructions  Take over-the-counter and prescription medicines only as told by your health care provider.  If you have sleep apnea, surgery and certain medicines can increase your risk for breathing problems. Follow instructions from your health care  provider about wearing your sleep device: ? Anytime you are sleeping, including during daytime naps. ? While taking prescription pain medicines, sleeping medicines, or medicines that make you drowsy.  If you smoke, do not smoke without supervision.  Keep all follow-up visits as told by your health care provider. This is important. Contact a health care provider if:  You keep feeling nauseous or you keep vomiting.  You feel light-headed.  You develop a rash.  You have a fever. Get help right away if:  You have trouble breathing. Summary  For several hours after your procedure, you may feel sleepy and have poor judgment.  Have a responsible adult stay with you for at least 24 hours or until you are awake and alert. This information is not intended to replace advice given to you by your health care provider. Make sure you discuss any questions you have with your health care provider. Document Revised: 06/06/2017 Document Reviewed: 06/29/2015 Elsevier Patient Education  [place].

## 2019-08-21 NOTE — H&P (Signed)
@LOGO @   Primary Care Physician:  Fayrene Helper, MD Primary Gastroenterologist:  Dr. Gala Romney  Pre-Procedure History & Physical: HPI:  Jennifer Blackburn is a 74 y.o. female here for surveillance EGD given history of gastric ulcer diagnosed last year.  GERD and dysphagia  much better on Protonix 40 mg twice daily and with cessation of all NSAIDs.   Past Medical History:  Diagnosis Date  . Anxiety   . CAD (coronary artery disease)   . Depression   . Diabetes mellitus type II    without complication  . DJD (degenerative joint disease) of lumbar spine   . Hypercholesteremia   . Hyperlipidemia   . Hypertension    benign   . Hypothyroidism   . Thyroid cancer ([place]) 2001    Past Surgical History:  Procedure Laterality Date  . ABDOMINAL HYSTERECTOMY    . CARDIAC CATHETERIZATION    . CATARACT EXTRACTION W/PHACO Right 07/17/2012   Procedure: CATARACT EXTRACTION PHACO AND INTRAOCULAR LENS PLACEMENT (IOC);  Surgeon: Tonny Branch, MD;  Location: [facility] ORS;  Service: Ophthalmology;  Laterality: Right;  CDE:25.51  . CATARACT EXTRACTION W/PHACO Left 09/17/2016   Procedure: CATARACT EXTRACTION PHACO AND INTRAOCULAR LENS PLACEMENT LEFT EYE;  Surgeon: Tonny Branch, MD;  Location: [facility] ORS;  Service: Ophthalmology;  Laterality: Left;  CDE: 19.23  . COLONOSCOPY    . COLONOSCOPY N/A 01/15/2014   Procedure: COLONOSCOPY;  Surgeon: Daneil Dolin, MD;  Location: [facility] END[facility] SUITE;  Service: Endoscopy;  Laterality: N/A;  9:00 AM  . COLONOSCOPY WITH PROPOFOL N/A 01/25/2019   Procedure: COLONOSCOPY WITH PROPOFOL;  Surgeon: Daneil Dolin, MD; diverticulosis in the entire colon, otherwise normal exam.  No repeat colonoscopy due to age.  . DOPPLER ECHOCARDIOGRAPHY    . ESOPHAGOGASTRODUODENOSCOPY (EGD) WITH PROPOFOL N/A 01/25/2019   Procedure: ESOPHAGOGASTRODUODENOSCOPY (EGD) WITH PROPOFOL;  Surgeon: Daneil Dolin, MD; normal esophagus without dilation due to inability to pass dilator beyond the hypopharynx, 1 small  nonbleeding gastric ulcer s/p biopsied, otherwise normal exam.  Pathology with ulcer with reactive changes, no H. pylori, metaplasia, dysplasia, or malignancy.  Marland Kitchen JOINT REPLACEMENT  07/01/2010   left hip  . KNEE SURGERY Right    arthroscopy  . left hip replaced  07/01/2010  . SPINE SURGERY  2006   cervical  . stress dipyridamole myocardial perfusion    . THYROIDECTOMY    . TONSILLECTOMY    . VESICOVAGINAL FISTULA CLOSURE W/ TAH      Prior to Admission medications   Medication Sig Start Date End Date Taking? Authorizing Provider  acetaminophen (TYLENOL) 500 MG tablet Take 1,000 mg by mouth every 6 (six) hours as needed (for pain.).   Yes [provider]  Cholecalciferol (VITAMIN D3) 50 MCG (2000 UT) TABS Take 2,000 Units by mouth daily.   Yes [provider]  ferrous sulfate 325 (65 FE) MG tablet Take 325 mg by mouth daily at 12 noon.    Yes [provider]  gabapentin (NEURONTIN) 100 MG capsule TAKE 1 CAPSULE(100 MG) BY MOUTH THREE TIMES DAILY Patient taking differently: Take 100 mg by mouth See admin instructions. Take 1 capsule (100 mg) by mouth scheduled in the morning, then take 1 capsule (100 mg) by mouth twice daily if needed for pain. 06/05/18  Yes Fayrene Helper, MD  imipramine (TOFRANIL) 25 MG tablet TAKE 4 TABLETS BY MOUTH AT BEDTIME Patient taking differently: Take 50-100 mg by mouth at bedtime.  07/30/19  Yes Fayrene Helper, MD  isosorbide  mononitrate (IMDUR) 30 MG 24 hr tablet TAKE HALF A TABLET BY MOUTH EVERY DAY Patient taking differently: Take 15 mg by mouth daily.  12/28/18  Yes Lorretta Harp, MD  levothyroxine (SYNTHROID) 150 MCG tablet TAKE 1 TABLET BY MOUTH EVERY DAY Patient taking differently: Take 150 mcg by mouth daily before breakfast.  05/28/19  Yes Philemon Kingdom, MD  loratadine (CLARITIN) 10 MG tablet Take 10 mg by mouth daily.    Yes [provider]  metoprolol tartrate (LOPRESSOR) 50 MG tablet TAKE 1 TABLET BY  MOUTH TWICE A DAY 06/18/19  Yes Lorretta Harp, MD  pantoprazole (PROTONIX) 40 MG tablet Take 1 tablet (40 mg total) by mouth daily. Patient taking differently: Take 40 mg by mouth 2 (two) times daily.  11/29/18  Yes Fayrene Helper, MD  PARoxetine (PAXIL) 20 MG tablet TAKE 1 TABLET BY MOUTH EVERY DAY Patient taking differently: Take 20 mg by mouth daily.  06/04/19  Yes Fayrene Helper, MD  pravastatin (PRAVACHOL) 80 MG tablet Take 1 tablet (80 mg total) by mouth daily. 06/04/19  Yes Fayrene Helper, MD  quinapril (ACCUPRIL) 10 MG tablet TAKE 1 TABLET DAILY Patient taking differently: Take 10 mg by mouth daily.  10/30/18  Yes Fayrene Helper, MD  Semaglutide,0.25 or 0.5MG /DOS, (OZEMPIC, 0.25 OR 0.5 MG/DOSE,) 2 MG/1.5ML SOPN Inject 0.5 mg into the skin once a week. Fridays   Yes [provider]  TOUJEO SOLOSTAR 300 UNIT/ML SOPN INJECT 30-50 UNITS INTO THE SKIN 2 TIMES DAILY. (OVER $600) Patient taking differently: Inject 30-50 Units into the skin See admin instructions. 30 units in the morning, 50 units at bedtime 05/02/19  Yes Philemon Kingdom, MD  verapamil (CALAN-SR) 180 MG CR tablet TAKE 1 TABLET BY MOUTH AT BEDTIME Patient taking differently: Take 180 mg by mouth at bedtime.  05/28/19  Yes Lorretta Harp, MD  vitamin B-12 (CYANOCOBALAMIN) 1000 MCG tablet Take 1 tablet (1,000 mcg total) by mouth daily. 08/20/14  Yes Fayrene Helper, MD  aspirin EC 81 MG tablet Take 1 tablet (81 mg total) by mouth daily. 10/27/15   Penumalli, Earlean Polka, MD  fluticasone (FLONASE) 50 MCG/ACT nasal spray Place 2 sprays into both nostrils daily. 12/07/18   Perlie Mayo, NP  glucose blood (ACCU-CHEK AVIVA PLUS) test strip Use as instructed two times daily dx E11.65 11/22/18   Fayrene Helper, MD  insulin lispro (HUMALOG) 100 UNIT/ML KwikPen Inject 8-10 units under the skin three times daily before meals. Please give Lispro, not name brand. 05/03/19   Philemon Kingdom, MD  Insulin Pen Needle  32G X 4 MM MISC Use 4x a day 12/28/18   Philemon Kingdom, MD  Lancets (ACCU-CHEK MULTICLIX) lancets Use as instructed three times daily dx 250.01 05/07/13   Fayrene Helper, MD  metFORMIN (GLUCOPHAGE) 1000 MG tablet TAKE 1 TABLET BY MOUTH TWICE A DAY 08/21/19   Fayrene Helper, MD  simethicone (PHAZYME) 125 MG chewable tablet Chew 1 tablet (125 mg total) by mouth every 6 (six) hours as needed for flatulence. 11/05/18   Fayrene Helper, MD    Allergies as of 05/07/2019 - Review Complete 05/07/2019  Allergen Reaction Noted  . Lipitor [atorvastatin calcium] Other (See Comments) 02/07/2019  . Daypro [oxaprozin] Hives 08/23/2016  . Sulfonamide derivatives Hives 04/14/2007    Family History  Problem Relation Age of Onset  . Heart attack Father   . Heart failure Mother   . Asthma Daughter   . Sleep  apnea Son        CPAP  . Colon cancer Neg Hx     Social History   Socioeconomic History  . Marital status: Married    Spouse name: Not on file  . Number of children: 2  . Years of education: 56  . Highest education level: Not on file  Occupational History  . Occupation: retired     Comment: bank  Tobacco Use  . Smoking status: Passive Smoke Exposure - Never Smoker  . Smokeless tobacco: Never Used  . Tobacco comment: Husband smokes in the home  Substance and Sexual Activity  . Alcohol use: No  . Drug use: No  . Sexual activity: Never  Other Topics Concern  . Not on file  Social History Narrative   Lives with husband, at home   No caffeine   Social Determinants of Health   Financial Resource Strain:   . Difficulty of Paying Living Expenses:   Food Insecurity: No Food Insecurity  . Worried About Charity fundraiser in the Last Year: Never true  . Ran Out of Food in the Last Year: Never true  Transportation Needs: No Transportation Needs  . Lack of Transportation (Medical): No  . Lack of Transportation (Non-Medical): No  Physical Activity: Insufficiently Active  .  Days of Exercise per Week: 4 days  . Minutes of Exercise per Session: 30 min  Stress: No Stress Concern Present  . Feeling of Stress : Not at all  Social Connections: Not Isolated  . Frequency of Communication with Friends and Family: More than three times a week  . Frequency of Social Gatherings with Friends and Family: Once a week  . Attends Religious Services: More than 4 times per year  . Active Member of Clubs or Organizations: Yes  . Attends Archivist Meetings: More than 4 times per year  . Marital Status: Married  Human resources officer Violence:   . Fear of Current or Ex-Partner:   . Emotionally Abused:   Marland Kitchen Physically Abused:   . Sexually Abused:     Review of Systems: See HPI, otherwise negative ROS  Physical Exam: BP 134/64   Pulse 65   Temp 98.5 F (36.9 C) (Oral)   Resp 15   Ht 5' 0.5" (1.537 m)   Wt 82.6 kg   LMP 11/11/2016   SpO2 100%   BMI 34.96 kg/m  General:   Alert,  Well-developed, well-nourished, pleasant and cooperative in NAD SNeck:  Supple; no masses or thyromegaly. No significant cervical adenopathy. Lungs:  Clear throughout to auscultation.   No wheezes, crackles, or rhonchi. No acute distress. Heart:  Regular rate and rhythm; no murmurs, clicks, rubs,  or gallops. Abdomen: Non-distended, normal bowel sounds.  Soft and nontender without appreciable mass or hepatosplenomegaly.  Pulses:  Normal pulses noted. Extremities:  Without clubbing or edema.  Impression/Plan: 74 year old lady history of gastric ulcer.  Here for surveillance EGD.  GERD and dysphagia have essentially resolved on Protonix 40 mg twice daily and cessation of NSAID therapy.  I have offered the patient a surveillance EGD today per plan. The risks, benefits, limitations, alternatives and imponderables have been reviewed with the patient. Potential for esophageal dilation, biopsy, etc. have also been reviewed.  Questions have been answered. All parties agreeable.  No plans for  dilation today.     Notice: This dictation was prepared with Dragon dictation along with smaller phrase technology. Any transcriptional errors that result from this process are unintentional and may  not be corrected upon review.

## 2019-08-21 NOTE — Anesthesia Preprocedure Evaluation (Signed)
Anesthesia Evaluation  Patient identified by MRN, date of birth, ID band Patient awake    Reviewed: Allergy & Precautions, NPO status , Patient's Chart, lab work & pertinent test results, reviewed documented beta blocker date and time   History of Anesthesia Complications (+) history of anesthetic complications  Airway Mallampati: II  TM Distance: >3 FB Neck ROM: Full    Dental  (+) Dental Advisory Given, Partial Upper   Pulmonary    Pulmonary exam normal breath sounds clear to auscultation       Cardiovascular Exercise Tolerance: Good hypertension, Pt. on medications and Pt. on home beta blockers + CAD and + Peripheral Vascular Disease (Bilateral carotid bruits)  Normal cardiovascular exam Rhythm:Regular Rate:Normal  Impression-2018 Myocardial perfusion is normal.   The study is normal.   This is a low risk study.  Overall left ventricular systolic function was normal.    LV cavity size is normal.  Nuclear stress EF:  67%.  The left ventricular ejection fraction is hyperdynamic (>65%).       Neuro/Psych  Headaches, PSYCHIATRIC DISORDERS Anxiety Depression Head trauma  Neuromuscular disease    GI/Hepatic Neg liver ROS, GERD  Medicated and Controlled,  Endo/Other  diabetes, Well Controlled, Type 2, Insulin DependentHypothyroidism   Renal/GU negative Renal ROS  negative genitourinary   Musculoskeletal  (+) Arthritis ,   Abdominal   Peds  Hematology  (+) anemia ,   Anesthesia Other Findings Thyroid cancer  Reproductive/Obstetrics                            Anesthesia Physical Anesthesia Plan  ASA: III  Anesthesia Plan: General   Post-op Pain Management:    Induction: Intravenous  PONV Risk Score and Plan: 2  Airway Management Planned: Nasal Cannula, Natural Airway and Simple Face Mask  Additional Equipment:   Intra-op Plan:   Post-operative Plan:   Informed Consent: I have  reviewed the patients History and Physical, chart, labs and discussed the procedure including the risks, benefits and alternatives for the proposed anesthesia with the patient or authorized representative who has indicated his/her understanding and acceptance.     Dental advisory given  Plan Discussed with: CRNA and Surgeon  Anesthesia Plan Comments:        Anesthesia Quick Evaluation

## 2019-08-21 NOTE — Anesthesia Postprocedure Evaluation (Signed)
Anesthesia Post Note  Patient: Jennifer Blackburn  Procedure(s) Performed: ESOPHAGOGASTRODUODENOSCOPY (EGD) WITH PROPOFOL (N/A )  Patient location during evaluation: Endoscopy Anesthesia Type: General Level of consciousness: awake and alert Pain management: pain level controlled Vital Signs Assessment: post-procedure vital signs reviewed and stable Respiratory status: spontaneous breathing, nonlabored ventilation, respiratory function stable and patient connected to nasal cannula oxygen Cardiovascular status: blood pressure returned to baseline and stable Postop Assessment: no apparent nausea or vomiting Anesthetic complications: no     Last Vitals:  Vitals:   08/21/19 1045 08/21/19 1100  BP: (!) 148/74 113/65  Pulse: 79 79  Resp: 15 (!) 21  Temp:    SpO2: 100% 100%    Last Pain:  Vitals:   08/21/19 1035  TempSrc:   PainSc: [place] Harlo Jaso

## 2019-08-22 ENCOUNTER — Ambulatory Visit: Payer: Medicare HMO | Admitting: Internal Medicine

## 2019-08-22 ENCOUNTER — Other Ambulatory Visit: Payer: Self-pay | Admitting: Internal Medicine

## 2019-08-29 ENCOUNTER — Ambulatory Visit (INDEPENDENT_AMBULATORY_CARE_PROVIDER_SITE_OTHER): Payer: Medicare HMO | Admitting: Family Medicine

## 2019-08-29 ENCOUNTER — Encounter: Payer: Self-pay | Admitting: Family Medicine

## 2019-08-29 ENCOUNTER — Other Ambulatory Visit: Payer: Self-pay

## 2019-08-29 VITALS — BP 120/70 | HR 87 | Temp 97.5°F | Resp 16 | Ht 65.0 in | Wt 187.0 lb

## 2019-08-29 DIAGNOSIS — I10 Essential (primary) hypertension: Secondary | ICD-10-CM

## 2019-08-29 DIAGNOSIS — E785 Hyperlipidemia, unspecified: Secondary | ICD-10-CM | POA: Diagnosis not present

## 2019-08-29 DIAGNOSIS — E782 Mixed hyperlipidemia: Secondary | ICD-10-CM | POA: Diagnosis not present

## 2019-08-29 DIAGNOSIS — F5102 Adjustment insomnia: Secondary | ICD-10-CM | POA: Diagnosis not present

## 2019-08-29 DIAGNOSIS — E559 Vitamin D deficiency, unspecified: Secondary | ICD-10-CM

## 2019-08-29 DIAGNOSIS — G4489 Other headache syndrome: Secondary | ICD-10-CM | POA: Diagnosis not present

## 2019-08-29 DIAGNOSIS — E1159 Type 2 diabetes mellitus with other circulatory complications: Secondary | ICD-10-CM | POA: Diagnosis not present

## 2019-08-29 LAB — POCT GLYCOSYLATED HEMOGLOBIN (HGB A1C): Hemoglobin A1C: 8.4 % — AB (ref 4.0–5.6)

## 2019-08-29 NOTE — Patient Instructions (Addendum)
Wellness past due please schedule  F/u with mD in 4 months, call if you need me sooner  Fasting lipid panel and vit d level this week if possible  You will be referred to therapist  Please practice good sleep hygiene, and you may take melatonin  To help with sleep    Insomnia Insomnia is a sleep disorder that makes it difficult to fall asleep or stay asleep. Insomnia can cause fatigue, low energy, difficulty concentrating, mood swings, and poor performance at work or school. There are three different ways to classify insomnia:  Difficulty falling asleep.  Difficulty staying asleep.  Waking up too early in the morning. Any type of insomnia can be long-term (chronic) or short-term (acute). Both are common. Short-term insomnia usually lasts for three months or less. Chronic insomnia occurs at least three times a week for longer than three months. What are the causes? Insomnia may be caused by another condition, situation, or substance, such as:  Anxiety.  Certain medicines.  Gastroesophageal reflux disease (GERD) or other gastrointestinal conditions.  Asthma or other breathing conditions.  Restless legs syndrome, sleep apnea, or other sleep disorders.  Chronic pain.  Menopause.  Stroke.  Abuse of alcohol, tobacco, or illegal drugs.  Mental health conditions, such as depression.  Caffeine.  Neurological disorders, such as Alzheimer's disease.  An overactive thyroid (hyperthyroidism). Sometimes, the cause of insomnia may not be known. What increases the risk? Risk factors for insomnia include:  Gender. Women are affected more often than men.  Age. Insomnia is more common as you get older.  Stress.  Lack of exercise.  Irregular work schedule or working night shifts.  Traveling between different time zones.  Certain medical and mental health conditions. What are the signs or symptoms? If you have insomnia, the main symptom is having trouble falling asleep  or having trouble staying asleep. This may lead to other symptoms, such as:  Feeling fatigued or having low energy.  Feeling nervous about going to sleep.  Not feeling rested in the morning.  Having trouble concentrating.  Feeling irritable, anxious, or depressed. How is this diagnosed? This condition may be diagnosed based on:  Your symptoms and medical history. Your health care provider may ask about: ? Your sleep habits. ? Any medical conditions you have. ? Your mental health.  A physical exam. How is this treated? Treatment for insomnia depends on the cause. Treatment may focus on treating an underlying condition that is causing insomnia. Treatment may also include:  Medicines to help you sleep.  Counseling or therapy.  Lifestyle adjustments to help you sleep better. Follow these instructions at home: Eating and drinking   Limit or avoid alcohol, caffeinated beverages, and cigarettes, especially close to bedtime. These can disrupt your sleep.  Do not eat a large meal or eat spicy foods right before bedtime. This can lead to digestive discomfort that can make it hard for you to sleep. Sleep habits   Keep a sleep diary to help you and your health care provider figure out what could be causing your insomnia. Write down: ? When you sleep. ? When you wake up during the night. ? How well you sleep. ? How rested you feel the next day. ? Any side effects of medicines you are taking. ? What you eat and drink.  Make your bedroom a dark, comfortable place where it is easy to fall asleep. ? Put up shades or blackout curtains to block light from outside. ? Use a white noise  machine to block noise. ? Keep the temperature cool.  Limit screen use before bedtime. This includes: ? Watching TV. ? Using your smartphone, tablet, or computer.  Stick to a routine that includes going to bed and waking up at the same times every day and night. This can help you fall asleep faster.  Consider making a quiet activity, such as reading, part of your nighttime routine.  Try to avoid taking naps during the day so that you sleep better at night.  Get out of bed if you are still awake after 15 minutes of trying to sleep. Keep the lights down, but try reading or doing a quiet activity. When you feel sleepy, go back to bed. General instructions  Take over-the-counter and prescription medicines only as told by your health care provider.  Exercise regularly, as told by your health care provider. Avoid exercise starting several hours before bedtime.  Use relaxation techniques to manage stress. Ask your health care provider to suggest some techniques that may work well for you. These may include: ? Breathing exercises. ? Routines to release muscle tension. ? Visualizing peaceful scenes.  Make sure that you drive carefully. Avoid driving if you feel very sleepy.  Keep all follow-up visits as told by your health care provider. This is important. Contact a health care provider if:  You are tired throughout the day.  You have trouble in your daily routine due to sleepiness.  You continue to have sleep problems, or your sleep problems get worse. Get help right away if:  You have serious thoughts about hurting yourself or someone else. If you ever feel like you may hurt yourself or others, or have thoughts about taking your own life, get help right away. You can go to your nearest emergency department or call:  Your local emergency services (911 in the [place]  A suicide crisis helpline, such as the [place] at 270-856-7979. This is open 24 hours a day. Summary  Insomnia is a sleep disorder that makes it difficult to fall asleep or stay asleep.  Insomnia can be long-term (chronic) or short-term (acute).  Treatment for insomnia depends on the cause. Treatment may focus on treating an underlying condition that is causing insomnia.  Keep a sleep  diary to help you and your health care provider figure out what could be causing your insomnia. This information is not intended to replace advice given to you by your health care provider. Make sure you discuss any questions you have with your health care provider. Document Revised: 02/18/2017 Document Reviewed: 12/16/2016 Elsevier Patient Education  2020 [employer].

## 2019-08-30 ENCOUNTER — Encounter: Payer: Self-pay | Admitting: Family Medicine

## 2019-08-30 NOTE — Op Note (Signed)
[facility] Patient Name: Jennifer Blackburn Procedure Date: 08/21/2019 9:49 AM MRN: [id number] Date of Birth: 04/29/1945 Attending MD: Norvel Richards , MD CSN: [id number] Age: 74 Admit Type: Outpatient Procedure:                Upper GI endoscopy Indications:              Surveillance procedure, Peptic ulcer, Gastrojejunal                            ulcer Providers:                Norvel Richards, MD, Janeece Riggers, RN, Nelma Rothman, Technician Referring MD:              Medicines:                Propofol per Anesthesia Complications:            No immediate complications. Estimated Blood Loss:     Estimated blood loss: none. Procedure:                Pre-Anesthesia Assessment:                           - Prior to the procedure, a History and Physical                            was performed, and patient medications and                            allergies were reviewed. The patient's tolerance of                            previous anesthesia was also reviewed. The risks                            and benefits of the procedure and the sedation                            options and risks were discussed with the patient.                            All questions were answered, and informed consent                            was obtained. Prior Anticoagulants: The patient has                            taken no previous anticoagulant or antiplatelet                            agents. ASA Grade Assessment: III - A patient with                            severe  systemic disease. After reviewing the risks                            and benefits, the patient was deemed in                            satisfactory condition to undergo the procedure.                           After obtaining informed consent, the endoscope was                            passed under direct vision. Throughout the                            procedure, the patient's blood  pressure, pulse, and                            oxygen saturations were monitored continuously. The                            [id number]-[id number] ([id number]) scope was introduced through the                            mouth, and advanced to the second part of duodenum.                            The upper GI endoscopy was accomplished without                            difficulty. The patient tolerated the procedure                            well. Scope In: 10:12:05 AM Scope Out: 10:16:19 AM Total Procedure Duration: 0 hours 4 minutes 14 seconds  Findings:      The examined esophagus was normal.      The entire examined stomach was normal. Previously noted gastric ulcer       completely healed      The duodenal bulb and second portion of the duodenum were normal. Impression:               - Normal esophagus.                           - Normal stomach. Gastric ulcer completely healed                           - Normal duodenal bulb and second portion of the                            duodenum.                           - No specimens collected. Moderate Sedation:      Moderate (conscious) sedation was personally administered by an  anesthesia professional. The following parameters were monitored: oxygen       saturation, heart rate, blood pressure, respiratory rate, EKG, adequacy       of pulmonary ventilation, and response to care. Recommendation:           - Written discharge instructions were provided to                            the patient.                           - The signs and symptoms of potential delayed                            complications were discussed with the patient.                           - Patient has a contact number available for                            emergencies.                           - Return to normal activities tomorrow.                           - Resume previous diet.                           - Continue present medications.                            - Return to my office in 2 months. Procedure Code(s):        --- Professional ---                           [phone], Esophagogastroduodenoscopy, flexible,                            transoral; diagnostic, including collection of                            specimen(s) by brushing or washing, when performed                            (separate procedure) Diagnosis Code(s):        --- Professional ---                           K27.9, Peptic ulcer, site unspecified, unspecified                            as acute or chronic, without hemorrhage or                            perforation  K28.9, Gastrojejunal ulcer, unspecified as acute or                            chronic, without hemorrhage or perforation CPT copyright 2019 American Medical Association. All rights reserved. The codes documented in this report are preliminary and upon coder review may  be revised to meet current compliance requirements. Cristopher Estimable. Rena Hunke, MD Norvel Richards, MD 08/30/2019 9:34:35 AM This report has been signed electronically. Number of Addenda: 0

## 2019-08-30 NOTE — Assessment & Plan Note (Signed)
Hyperlipidemia:Low fat diet discussed and encouraged.   Lipid Panel  Lab Results  Component Value Date   CHOL 189 12/21/2018   HDL 42 (L) 12/21/2018   LDLCALC 117 (H) 12/21/2018   TRIG 179 (H) 12/21/2018   CHOLHDL 4.5 12/21/2018   Updated lab needed at/ before next visit.

## 2019-08-30 NOTE — Progress Notes (Signed)
JOCILYN TREGO     MRN: [id number]      DOB: 1945/07/10   HPI Ms. Khanna is here for follow up and re-evaluation of chronic medical conditions, medication management and review of any available recent lab and radiology data.  Preventive health is updated, specifically  Cancer screening and Immunization.   Questions or concerns regarding consultations or procedures which the PT has had in the interim are  addressed. The PT denies any adverse reactions to current medications since the last visit.  C/o fatigue, poor sleep, takes 2 hrs to fall asleep with multiple awakenings. staates when she leaves her home , where she feels irriated by her spouse and goes to stay with her daughter, her sleep is improved. Has benefited in the past from therapy and is interested in returning Blood sugar is improving, howver still unablee to afford all medications prescribed and she has appt this week with Endo Denies polyuria, polydipsia, blurred vision , or hypoglycemic episodes.   ROS Denies recent fever or chills. Denies sinus pressure, nasal congestion, ear pain or sore throat. Denies chest congestion, productive cough or wheezing. Denies chest pains, palpitations and leg swelling Denies abdominal pain, nausea, vomiting,diarrhea or constipation.   Denies dysuria, frequency, hesitancy or incontinence. Denies joint pain, swelling and limitation in mobility.  Denies skin break down or rash.   PE  BP 120/70   Pulse 87   Temp (!) 97.5 F (36.4 C) (Temporal)   Resp 16   Ht 5\' 5"  (1.651 m)   Wt 187 lb (84.8 kg)   LMP 11/11/2016   SpO2 96%   BMI 31.12 kg/m   Patient alert and oriented and in no cardiopulmonary distress.  HEENT: No facial asymmetry, EOMI,     Neck supple .  Chest: Clear to auscultation bilaterally.  CVS: S1, S2 no murmurs, no S3.Regular rate.  ABD: Soft non tender.   Ext: No edema  MS: Adequate ROM spine, shoulders, hips and knees.  Skin: Intact, no ulcerations or rash  noted.  Psych: Good eye contact, normal affect. Memory intact not anxious or depressed appearing.  CNS: CN 2-12 intact, power,  normal throughout.no focal deficits noted.   Assessment & Plan   Hypertension Controlled, no change in medication DASH diet and commitment to daily physical activity for a minimum of 30 minutes discussed and encouraged, as a part of hypertension management. The importance of attaining a healthy weight is also discussed.  BP/Weight 08/29/2019 08/21/2019 08/15/2019 08/06/2019 05/07/2019 05/03/2019 5/36/6440  Systolic BP 347 425 956 387 564 332 951  Diastolic BP 70 60 64 63 63 78 78  Wt. (Lbs) 187 182 182 184 184.4 185 190  BMI 31.12 34.96 30.29 30.62 30.69 30.79 31.62       Type 2 diabetes mellitus with vascular disease ([place]) Ms. Viramontes is reminded of the importance of commitment to daily physical activity for 30 minutes or more, as able and the need to limit carbohydrate intake to 30 to 60 grams per meal to help with blood sugar control.   The need to take medication as prescribed, test blood sugar as directed, and to call between visits if there is a concern that blood sugar is uncontrolled is also discussed.   Ms. Burd is reminded of the importance of daily foot exam, annual eye examination, and good blood sugar, blood pressure and cholesterol control. Improved , though still not at goal, endo manages and she has appt this week. Applauded on progress, needs to discuss  affordability of meds with Dr  Diabetic Labs Latest Ref Rng & Units 08/29/2019 08/15/2019 05/07/2019 05/03/2019 12/28/2018  HbA1c 4.0 - 5.6 % 8.4(A) - - 9.3(A) 8.8(A)  Microalbumin mg/dL - - - - -  Micro/Creat Ratio 0.0 - 30.0 mg/g creat - - - - -  Chol <200 mg/dL - - - - -  HDL > OR = 50 mg/dL - - - - -  Calc LDL mg/dL (calc) - - - - -  Triglycerides <150 mg/dL - - - - -  Creatinine 0.44 - 1.00 mg/dL - 0.79 0.85 - -   BP/Weight 08/29/2019 08/21/2019 08/15/2019 08/06/2019 05/07/2019 05/03/2019 2/57/4935   Systolic BP 521 747 159 539 672 897 915  Diastolic BP 70 60 64 63 63 78 78  Wt. (Lbs) 187 182 182 184 184.4 185 190  BMI 31.12 34.96 30.29 30.62 30.69 30.79 31.62   Foot/eye exam completion dates Latest Ref Rng & Units 08/29/2019 05/24/2018  Eye Exam No Retinopathy - No Retinopathy  Foot exam Order - - -  Foot Form Completion - Done -        Hyperlipidemia Hyperlipidemia:Low fat diet discussed and encouraged.   Lipid Panel  Lab Results  Component Value Date   CHOL 189 12/21/2018   HDL 42 (L) 12/21/2018   LDLCALC 117 (H) 12/21/2018   TRIG 179 (H) 12/21/2018   CHOLHDL 4.5 12/21/2018   Updated lab needed at/ before next visit.     Insomnia due to stress Experiences high level of stress in her home, leading to insomnia, refer to therapy , has benefited in the past  Headache Controlled, no change in medication

## 2019-08-30 NOTE — Assessment & Plan Note (Signed)
Experiences high level of stress in her home, leading to insomnia, refer to therapy , has benefited in the past

## 2019-08-30 NOTE — Assessment & Plan Note (Signed)
Controlled, no change in medication DASH diet and commitment to daily physical activity for a minimum of 30 minutes discussed and encouraged, as a part of hypertension management. The importance of attaining a healthy weight is also discussed.  BP/Weight 08/29/2019 08/21/2019 08/15/2019 08/06/2019 05/07/2019 05/03/2019 2/64/1583  Systolic BP 094 076 808 811 031 594 585  Diastolic BP 70 60 64 63 63 78 78  Wt. (Lbs) 187 182 182 184 184.4 185 190  BMI 31.12 34.96 30.29 30.62 30.69 30.79 31.62

## 2019-08-30 NOTE — Assessment & Plan Note (Signed)
Jennifer Blackburn is reminded of the importance of commitment to daily physical activity for 30 minutes or more, as able and the need to limit carbohydrate intake to 30 to 60 grams per meal to help with blood sugar control.   The need to take medication as prescribed, test blood sugar as directed, and to call between visits if there is a concern that blood sugar is uncontrolled is also discussed.   Jennifer Blackburn is reminded of the importance of daily foot exam, annual eye examination, and good blood sugar, blood pressure and cholesterol control. Improved , though still not at goal, endo manages and she has appt this week. Applauded on progress, needs to discuss affordability of meds with Dr  Diabetic Labs Latest Ref Rng & Units 08/29/2019 08/15/2019 05/07/2019 05/03/2019 12/28/2018  HbA1c 4.0 - 5.6 % 8.4(A) - - 9.3(A) 8.8(A)  Microalbumin mg/dL - - - - -  Micro/Creat Ratio 0.0 - 30.0 mg/g creat - - - - -  Chol <200 mg/dL - - - - -  HDL > OR = 50 mg/dL - - - - -  Calc LDL mg/dL (calc) - - - - -  Triglycerides <150 mg/dL - - - - -  Creatinine 0.44 - 1.00 mg/dL - 0.79 0.85 - -   BP/Weight 08/29/2019 08/21/2019 08/15/2019 08/06/2019 05/07/2019 05/03/2019 7/85/8850  Systolic BP 277 412 878 676 720 947 096  Diastolic BP 70 60 64 63 63 78 78  Wt. (Lbs) 187 182 182 184 184.4 185 190  BMI 31.12 34.96 30.29 30.62 30.69 30.79 31.62   Foot/eye exam completion dates Latest Ref Rng & Units 08/29/2019 05/24/2018  Eye Exam No Retinopathy - No Retinopathy  Foot exam Order - - -  Foot Form Completion - Done -

## 2019-08-30 NOTE — Assessment & Plan Note (Signed)
Controlled, no change in medication  

## 2019-08-31 ENCOUNTER — Other Ambulatory Visit: Payer: Self-pay

## 2019-08-31 ENCOUNTER — Ambulatory Visit: Payer: Medicare HMO | Admitting: Internal Medicine

## 2019-08-31 ENCOUNTER — Encounter: Payer: Self-pay | Admitting: Internal Medicine

## 2019-08-31 VITALS — BP 130/68 | HR 71 | Ht 65.0 in | Wt 185.0 lb

## 2019-08-31 DIAGNOSIS — E890 Postprocedural hypothyroidism: Secondary | ICD-10-CM

## 2019-08-31 DIAGNOSIS — C73 Malignant neoplasm of thyroid gland: Secondary | ICD-10-CM

## 2019-08-31 DIAGNOSIS — E1159 Type 2 diabetes mellitus with other circulatory complications: Secondary | ICD-10-CM

## 2019-08-31 LAB — TSH: TSH: 0.05 u[IU]/mL — ABNORMAL LOW (ref 0.35–4.50)

## 2019-08-31 LAB — T4, FREE: Free T4: 1.14 ng/dL (ref 0.60–1.60)

## 2019-08-31 MED ORDER — INSULIN LISPRO (1 UNIT DIAL) 100 UNIT/ML (KWIKPEN): PEN_INJECTOR | SUBCUTANEOUS | 2 refills | Status: DC

## 2019-08-31 MED ORDER — SYNTHROID 137 MCG PO TABS: 137.0000 ug | ORAL_TABLET | Freq: Every day | ORAL | 3 refills | Status: DC

## 2019-08-31 NOTE — Progress Notes (Signed)
Patient ID: Jennifer Blackburn, female   DOB: January 28, 1946, 74 y.o.   MRN: [id number]   This visit occurred during the SARS-CoV-2 public health emergency.  Safety protocols were in place, including screening questions prior to the visit, additional usage of staff PPE, and extensive cleaning of exam room while observing appropriate contact time as indicated for disinfecting solutions.   HPI: Jennifer Blackburn is a 74 y.o.-year-old female, returning for follow-up for DM2, dx in 2001, insulin-dependent since 2017, uncontrolled, with long-term complications (DR, CAD) and also papillary thyroid cancer and postsurgical hypothyroidism.  Last visit 4 months ago.  DM2: Reviewed HbA1c levels: Lab Results  Component Value Date   HGBA1C 8.4 (A) 08/29/2019   HGBA1C 9.3 (A) 05/03/2019   HGBA1C 8.8 (A) 12/28/2018   HGBA1C 8.9 (A) 09/21/2018   HGBA1C 8.4 (A) 05/26/2018   HGBA1C 8.0 (A) 02/07/2018   HGBA1C 8.9 08/06/2017   HGBA1C 8.0 10/20/2015   HGBA1C 9.0 (H) 11/14/2014   HGBA1C 9.0 (H) 07/10/2014  01/12/2018: HbA1c 8.7% 08/05/2017: HbA1c 8.9% 07/11/2017: HbA1c 9.1%  Pt is on a regimen of: - Metformin 1000 mg 2x a day with meals. - Toujeo 30 units in a.m. and 50 units at bedtime >> 30 units in a.m. and 40 units at bedtime -  -recommended 04/2019 but did not start - Ozempic 0.5 mg weekly-started 01/2019 She was on Ozempic 0.5 mg weekly in a.m. >> 1 mg  - constipation >> stopped 06/2018.  We added it back 09/2018. She was on Victoza >> $$$. She was on Jardiance >> $$$. She was on Actos >> swelling - stopped Spring 2019. She was on Glipizide XL 5 mg daily >> stopped 07/2017.  We retried glipizide 5 mg twice a day but had to stop 12/2018 due to lack of effect.  Pt checks her sugars 1-2 times daily: - am:  110-160 >> 120-140 >> 160-268 >> 99, 120-160 - 2h after b'fast: n/c - before lunch: n/c - 2h after lunch: n/c >> 265-300s >> n/c - before dinner: n/c - 2h after dinner: n/c - bedtime: 198-210, 320 >>  230-240 >> 200s >> n/c >> 180-210 - nighttime: n/c Lowest sugar was 110 >> 137 >> 99; she has hypoglycemia awareness in the 60s. Highest sugar was 320 >> 240 .  Glucometer: AccuChek Aviva  Pt's meals are: - Breakfast:toast, cereal, milk; oatmeal; bacon + egg - Lunch: skips  - Dinner: hamburger, or chicken, or fish, + veggies, bread, water and tea - Snacks: 2- fruit She was seeing a dietitian in [place]. She walks for exercise.  -No CKD, last BUN/creatinine:  Lab Results  Component Value Date   BUN 14 08/15/2019   BUN 17 05/07/2019   CREATININE 0.79 08/15/2019   CREATININE 0.85 05/07/2019  On quinapril 10.  -+ HL; last set of lipids: Lab Results  Component Value Date   CHOL 189 12/21/2018   HDL 42 (L) 12/21/2018   LDLCALC 117 (H) 12/21/2018   TRIG 179 (H) 12/21/2018   CHOLHDL 4.5 12/21/2018  On pravastatin 80.  - last eye exam was in 05/2018:?  DR. Dr. Eulas Post in [place].  She has cataracts OU. Eye exam coming up 10/2019.  -No numbness and tingling in her feet. Has a podiatrist.   On ASA 81.  Pt has no FH of DM.  She also has a h/o Thyroid cancer (2001), s/p RAI treatment, now with postsurgical hypothyroidism.  Previously on Brand name Synthroid, now on generic LT4.  Pt is on levothyroxine  150 mcg daily, taken: - in am - fasting - at least 30 min from b'fast - no Ca, MVI, + iron 4h later - + Protonix moved during the day at last visit - not on Biotin  Reviewed her TFTs: Lab Results  Component Value Date   TSH 0.04 (L) 12/28/2018   TSH 0.06 (L) 09/21/2018   TSH 0.61 05/26/2018   TSH 2.49 03/23/2016   TSH 0.070 (L) 10/13/2010   TSH 0.035 (L) 09/22/2006   TSH 0.015 (L) 06/17/2006  11/03/2017: TSH 0.044  Her thyroglobulin levels were detectable but decreased at last check: Lab Results  Component Value Date   THYROGLB 0.3 (L) 09/21/2018   THYROGLB 0.7 (L) 05/26/2018   Lab Results  Component Value Date   THGAB <1 09/21/2018   THGAB <1 05/26/2018    Reviewed imaging test reports: Thyroid ultrasound (06/10/2018): 1.1 cm nonspecific calcified lesion in the right upper neck.  CT was recommended: There is no residual or recurrent tissue in the right or left thyroid beds. There is no evidence of abnormal adenopathy by short axis diameter measurement criteria There is a nonspecific calcified soft tissue mass measuring 1.1 cm in the right superior neck. This is of unknown significance. CT neck can be performed to further characterize.  CT (10/18/2018): Status post thyroidectomy. No soft tissue mass within the thyroidectomy bed. A 13 x 7 mm ovoid focus of calcification in the right aspect of the thyroidectomy bed likely corresponding with the finding on recent neck ultrasound and is unchanged as compared to neck CT 10/24/2007, benign.  No pathologically enlarged cervical chain lymph nodes. A nonenlarged calcified right supraclavicular lymph node is new from prior neck CT 10/24/2007 but also favored benign. Attention recommended on follow-up.  Reviewed records per Dr. Arletta Bale: Patient is status post total thyroidectomy in 2004 1.3 cm papillary thyroid cancer of the right lobe, followed by 3x RAI tx's with  33 mCi I-131 and a fourth RAI dose inpatient: 125 mCi, at [facility].    Whole-body scan on 09/08/2007 showed an increase in the thyroid bed activity and the follow up study with PET scan 10/08/2007 showed mildly hypermetabolic cervical lymph nodes.    Repeat PET 05/10/2008 showed no significant changes felt likely to be due to to a reactive process.  11/03/2017: TSH 0.044, thyroglobulin 0.2, ATA <1.0  04/05/2017: TSH 0.021 (0.45-4.5), thyroglobulin 0.3 (by IMA), ATA <1.0  She also has a history of HTN, anemia.  Her daughter lives in [place] and she is sick.  Patient travels between here in [place] frequently.   ROS: Constitutional: no weight gain/+ weight loss (5 pounds since last visit), no fatigue, no subjective  hyperthermia, no subjective hypothermia Eyes: no blurry vision, no xerophthalmia ENT: no sore throat, + see HPI Cardiovascular: no CP/no SOB/no palpitations/no leg swelling Respiratory: no cough/no SOB/no wheezing Gastrointestinal: no N/no V/no D/no C/no acid reflux Musculoskeletal: no muscle aches/no joint aches Skin: no rashes, no hair loss Neurological: no tremors/no numbness/no tingling/no dizziness  I reviewed pt's medications, allergies, PMH, social hx, family hx, and changes were documented in the history of present illness. Otherwise, unchanged from my initial visit note.  Past Medical History:  Diagnosis Date  . Anxiety   . CAD (coronary artery disease)   . Depression   . Diabetes mellitus type II    without complication  . DJD (degenerative joint disease) of lumbar spine   . Hypercholesteremia   . Hyperlipidemia   . Hypertension    benign   .  Hypothyroidism   . Thyroid cancer ([place]) 2001   Past Surgical History:  Procedure Laterality Date  . ABDOMINAL HYSTERECTOMY    . CARDIAC CATHETERIZATION    . CATARACT EXTRACTION W/PHACO Right 07/17/2012   Procedure: CATARACT EXTRACTION PHACO AND INTRAOCULAR LENS PLACEMENT (IOC);  Surgeon: Tonny Branch, MD;  Location: [facility];  Service: Ophthalmology;  Laterality: Right;  CDE:25.51  . CATARACT EXTRACTION W/PHACO Left 09/17/2016   Procedure: CATARACT EXTRACTION PHACO AND INTRAOCULAR LENS PLACEMENT LEFT EYE;  Surgeon: Tonny Branch, MD;  Location: [facility];  Service: Ophthalmology;  Laterality: Left;  CDE: 19.23  . COLONOSCOPY    . COLONOSCOPY N/A 01/15/2014   Procedure: COLONOSCOPY;  Surgeon: Daneil Dolin, MD;  Location: [facility] SU[facility];  Service: Endoscopy;  Laterality: N/A;  9:00 AM  . COLONOSCOPY WITH PROPOFOL N/A 01/25/2019   Procedure: COLONOSCOPY WITH PROPOFOL;  Surgeon: Daneil Dolin, MD; diverticulosis in the entire colon, otherwise normal exam.  No repeat colonoscopy due to age.  . DOPPLER ECHOCARDIOGRAPHY    .  ESOPHAGOGASTRODUODENOSCOPY (EGD) WITH PROPOFOL N/A 01/25/2019   Procedure: ESOPHAGOGASTRODUODENOSCOPY (EGD) WITH PROPOFOL;  Surgeon: Daneil Dolin, MD; normal esophagus without dilation due to inability to pass dilator beyond the hypopharynx, 1 small nonbleeding gastric ulcer s/p biopsied, otherwise normal exam.  Pathology with ulcer with reactive changes, no H. pylori, metaplasia, dysplasia, or malignancy.  Marland Kitchen JOINT REPLACEMENT  07/01/2010   left hip  . KNEE SURGERY Right    arthroscopy  . left hip replaced  07/01/2010  . SPINE SURGERY  2006   cervical  . stress dipyridamole myocardial perfusion    . THYROIDECTOMY    . TONSILLECTOMY    . VESICOVAGINAL FISTULA CLOSURE W/ TAH     Social History   Socioeconomic History  . Marital status: Married    Spouse name: Not on file  . Number of children: 2  . Years of education: 3  . Highest education level: Not on file  Occupational History  . Occupation: retired     Comment: bank  Tobacco Use  . Smoking status: Passive Smoke Exposure - Never Smoker  . Smokeless tobacco: Never Used  . Tobacco comment: Husband smokes in the home  Vaping Use  . Vaping Use: Never used  Substance and Sexual Activity  . Alcohol use: No  . Drug use: No  . Sexual activity: Never  Other Topics Concern  . Not on file  Social History Narrative   Lives with husband, at home   No caffeine   Social Determinants of Health   Financial Resource Strain:   . Difficulty of Paying Living Expenses:   Food Insecurity: No Food Insecurity  . Worried About Charity fundraiser in the Last Year: Never true  . Ran Out of Food in the Last Year: Never true  Transportation Needs: No Transportation Needs  . Lack of Transportation (Medical): No  . Lack of Transportation (Non-Medical): No  Physical Activity: Insufficiently Active  . Days of Exercise per Week: 4 days  . Minutes of Exercise per Session: 30 min  Stress: No Stress Concern Present  . Feeling of Stress : Not at  all  Social Connections: Socially Integrated  . Frequency of Communication with Friends and Family: More than three times a week  . Frequency of Social Gatherings with Friends and Family: Once a week  . Attends Religious Services: More than 4 times per year  . Active Member of Clubs or Organizations: Yes  . Attends Club or  Organization Meetings: More than 4 times per year  . Marital Status: Married  Human resources officer Violence:   . Fear of Current or Ex-Partner:   . Emotionally Abused:   Marland Kitchen Physically Abused:   . Sexually Abused:    Current Outpatient Medications  Medication Sig Dispense Refill  . acetaminophen (TYLENOL) 500 MG tablet Take 1,000 mg by mouth every 6 (six) hours as needed (for pain.).    Marland Kitchen aspirin EC 81 MG tablet Take 1 tablet (81 mg total) by mouth daily.    . Cholecalciferol (VITAMIN D3) 50 MCG (2000 UT) TABS Take 2,000 Units by mouth daily.    . ferrous sulfate 325 (65 FE) MG tablet Take 325 mg by mouth daily at 12 noon.     . fluticasone (FLONASE) 50 MCG/ACT nasal spray Place 2 sprays into both nostrils daily. 16 g 6  . gabapentin (NEURONTIN) 100 MG capsule TAKE 1 CAPSULE(100 MG) BY MOUTH THREE TIMES DAILY (Patient taking differently: Take 100 mg by mouth See admin instructions. Take 1 capsule (100 mg) by mouth scheduled in the morning, then take 1 capsule (100 mg) by mouth twice daily if needed for pain.) 270 capsule 1  . glucose blood (ACCU-CHEK AVIVA PLUS) test strip Use as instructed two times daily dx E11.65 150 each 5  . imipramine (TOFRANIL) 25 MG tablet TAKE 4 TABLETS BY MOUTH AT BEDTIME 360 tablet 0  . Insulin Pen Needle 32G X 4 MM MISC Use 4x a day 300 each 3  . isosorbide mononitrate (IMDUR) 30 MG 24 hr tablet TAKE HALF A TABLET BY MOUTH EVERY DAY (Patient taking differently: Take 15 mg by mouth daily. ) 45 tablet 3  . Lancets (ACCU-CHEK MULTICLIX) lancets Use as instructed three times daily dx 250.01 100 each 5  . levothyroxine (SYNTHROID) 150 MCG tablet TAKE 1  TABLET BY MOUTH EVERY DAY (Patient taking differently: Take 150 mcg by mouth daily before breakfast. ) 90 tablet 1  . loratadine (CLARITIN) 10 MG tablet Take 10 mg by mouth daily.     . Melatonin 1 MG CAPS Take one capsule at night 30 capsule 5  . metFORMIN (GLUCOPHAGE) 1000 MG tablet TAKE 1 TABLET BY MOUTH TWICE A DAY 180 tablet 1  . metoprolol tartrate (LOPRESSOR) 50 MG tablet TAKE 1 TABLET BY MOUTH TWICE A DAY 180 tablet 2  . OZEMPIC, 0.25 OR 0.5 MG/DOSE, 2 MG/1.5ML SOPN INJECT 0.5 MG INTO THE SKIN ONCE A WEEK. 3 pen 2  . pantoprazole (PROTONIX) 40 MG tablet Take 1 tablet (40 mg total) by mouth daily. (Patient taking differently: Take 40 mg by mouth 2 (two) times daily. ) 90 tablet 1  . PARoxetine (PAXIL) 20 MG tablet TAKE 1 TABLET BY MOUTH EVERY DAY (Patient taking differently: Take 20 mg by mouth daily. ) 90 tablet 1  . pravastatin (PRAVACHOL) 80 MG tablet Take 1 tablet (80 mg total) by mouth daily. 90 tablet 1  . quinapril (ACCUPRIL) 10 MG tablet TAKE 1 TABLET DAILY (Patient taking differently: Take 10 mg by mouth daily. ) 90 tablet 1  . simethicone (PHAZYME) 125 MG chewable tablet Chew 1 tablet (125 mg total) by mouth every 6 (six) hours as needed for flatulence. 30 tablet 0  . TOUJEO SOLOSTAR 300 UNIT/ML SOPN INJECT 30-50 UNITS INTO THE SKIN 2 TIMES DAILY. (OVER $600) (Patient taking differently: Inject 30-50 Units into the skin See admin instructions. 30 units in the morning, 50 units at bedtime) 9 pen 2  . verapamil (CALAN-SR) 180  MG CR tablet TAKE 1 TABLET BY MOUTH EVERYDAY AT BEDTIME 90 tablet 0  . vitamin B-12 (CYANOCOBALAMIN) 1000 MCG tablet Take 1 tablet (1,000 mcg total) by mouth daily. 30 tablet 5  . insulin lispro (HUMALOG) 100 UNIT/ML KwikPen Inject 8-10 units under the skin three times daily before meals. Please give Lispro, not name brand. (Patient not taking: Reported on 08/31/2019) 15 mL 2   No current facility-administered medications for this visit.     Allergies  Allergen  Reactions  . Lipitor [Atorvastatin Calcium] Other (See Comments)    Muscle aches  . Daypro [Oxaprozin] Hives  . Nsaids Hives  . Sulfonamide Derivatives Hives   Family History  Problem Relation Age of Onset  . Heart attack Father   . Heart failure Mother   . Asthma Daughter   . Sleep apnea Son        CPAP  . Colon cancer Neg Hx     PE: BP 130/68   Pulse 71   Ht 5\' 5"  (1.651 m)   Wt 185 lb (83.9 kg)   LMP 11/11/2016   SpO2 97%   BMI 30.79 kg/m  Wt Readings from Last 3 Encounters:  08/31/19 185 lb (83.9 kg)  08/29/19 187 lb (84.8 kg)  08/21/19 182 lb (82.6 kg)   Constitutional: overweight, in NAD Eyes: PERRLA, EOMI, no exophthalmos ENT: moist mucous membranes, no neck masses palpable, no cervical lymphadenopathy Cardiovascular: RRR, No MRG Respiratory: CTA B Gastrointestinal: abdomen soft, NT, ND, BS+ Musculoskeletal: no deformities, strength intact in all 4 Skin: moist, warm, no rashes Neurological: no tremor with outstretched hands, DTR normal in all 4  ASSESSMENT: 1. DM2, insulin-dependent, uncontrolled, with long-term complications - CAD - DR  2.  Papillary thyroid cancer  3.  Postsurgical hypothyroidism  PLAN:  1. Patient with longstanding, uncontrolled, type 2 diabetes, on Metformin, basal insulin regimen (she did not start rapid acting insulin as suggested at last visit) and lower dose GLP-1 receptor agonist (she had constipation with a higher dose).  At last visit, she was only checking sugars in the morning and they were quite high.  She did not check sugars later in the day to determine trends.  I advised her to rotate checking sugars later in the day.  -She had an HbA1c checked 2 days ago and this was better, at 8.4%, but still above target -At this visit, she tells me that she is not on lispro before meals, as suggested at last visit, and she wanted to see if she can improve her sugars by herself.  Indeed, her sugars are better lows in the morning and at  night, however, they are still above target.  I did advise her to check some sugars in the middle of the day, but for now, I suggested to add rapid acting insulin before dinner.  She agrees with this plan. - I suggested to:  Patient Instructions  Please continue: - Metformin 1000 mg 2x a day with meals. - Ozempic 0.5 mg weekly - Toujeo 30 units in a.m. and 40 units at bedtime  Please start - Lispro 6-10 units 15 min before dinner  Please continue levothyroxine 150 mcg daily.  Take the thyroid hormone every day, with water, at least 30 minutes before breakfast, separated by at least 4 hours from: - acid reflux medications - calcium - iron - multivitamins  Please stop at the lab.  Please return in 3-4 months with your sugar log.   - advised to check sugars  at different times of the day - 3-4x a day, rotating check times - advised for yearly eye exams >> she is UTD - return to clinic in 3-4 months    2.  Papillary thyroid cancer -Previous thyroid cancer history records were reviewed from Dr. Ronnald Collum -Her thyroid cancer appears to be metastatic, and she had for RAI treatments.  She also had increased signal on the PET scan from 2009, however, in 2010, another PET scan showed possible inflammatory lymph nodes in the area. -Previous thyroglobulin levels were detectable.  Neck ultrasound showed a nonspecific calcified lesion in the right upper neck and we obtained a neck CT which showed a stable calcified mass in 2009, most likely benign -At this visit, we will check another thyroglobulin and ATA antibodies  3.  Postsurgical hypothyroidism - latest thyroid labs reviewed with pt >> suppressed at last check (see below), after which we decreased the dose of her levothyroxine.  However, we could not check her TSH at last visit and she was not taking it correctly.  She did not return for recheck afterwards. Lab Results  Component Value Date   TSH 0.04 (L) 12/28/2018   - she continues on  LT4 150 mcg daily - pt feels good on this dose, but she is feeling fatigued and would like to switch back to Synthroid d.a.w., which she was taking in the past. - we discussed about taking the thyroid hormone every day, with water, >30 minutes before breakfast, separated by >4 hours from acid reflux medications, calcium, iron, multivitamins. Pt. is taking it correctly now, but at last visit she was taking Protonix close to levothyroxine. - will check thyroid tests today: TSH and fT4 - If labs are abnormal, she will need to return for repeat TFTs in 1.5 months  Orders Placed This Encounter  Procedures  . TSH  . T4, free  . Thyroglobulin antibody  . Thyroglobulin Level   Would like to switch back to Synthroid DAW.  Office Visit on 08/31/2019  Component Date Value Ref Range Status  . TSH 08/31/2019 0.05* 0.35 - 4.50 uIU/mL Final  . Free T4 08/31/2019 1.14  0.60 - 1.60 ng/dL Final   Comment: Specimens from patients who are undergoing biotin therapy and /or ingesting biotin supplements may contain high levels of biotin.  The higher biotin concentration in these specimens interferes with this Free T4 assay.  Specimens that contain high levels  of biotin may cause false high results for this Free T4 assay.  Please interpret results in light of the total clinical presentation of the patient.     TSH still suppressed.  We will decrease the dose to 137 mcg daily and switch to Synthroid d.a.w.  We will recheck her tests in 1.5 months. Component     Latest Ref Rng & Units 08/31/2019  Thyroglobulin     ng/mL 0.4 (L)  Thyroglobulin Ab     < or = 1 IU/mL <1   Thyroglobulin is still detectable, only slightly higher than before.  This may be due to assay variability.  We will repeat this at next visit.  Philemon Kingdom, MD PhD [facility] Endocrinology

## 2019-08-31 NOTE — Patient Instructions (Addendum)
Please continue: - Metformin 1000 mg 2x a day with meals. - Ozempic 0.5 mg weekly - Toujeo 30 units in a.m. and 40 units at bedtime  Please start - Lispro 6-10 units 15 min before dinner  Please continue levothyroxine 150 mcg daily.  Take the thyroid hormone every day, with water, at least 30 minutes before breakfast, separated by at least 4 hours from: - acid reflux medications - calcium - iron - multivitamins  Please stop at the lab.  Please return in 3-4 months with your sugar log.

## 2019-09-03 LAB — THYROGLOBULIN LEVEL: Thyroglobulin: 0.4 ng/mL — ABNORMAL LOW

## 2019-09-03 LAB — THYROGLOBULIN ANTIBODY: Thyroglobulin Ab: <1 IU/mL (ref <=1)

## 2019-09-04 DIAGNOSIS — E785 Hyperlipidemia, unspecified: Secondary | ICD-10-CM | POA: Diagnosis not present

## 2019-09-04 DIAGNOSIS — E559 Vitamin D deficiency, unspecified: Secondary | ICD-10-CM | POA: Diagnosis not present

## 2019-09-05 LAB — VITAMIN D 25 HYDROXY (VIT D DEFICIENCY, FRACTURES): Vit D, 25-Hydroxy: 30 ng/mL (ref 30–100)

## 2019-09-05 LAB — LIPID PANEL
Cholesterol: 157 mg/dL (ref <200)
HDL: 43 mg/dL — ABNORMAL LOW (ref >=50)
LDL Cholesterol (Calc): 91 mg/dL (calc)
Non-HDL Cholesterol (Calc): 114 mg/dL (calc) (ref <130)
Total CHOL/HDL Ratio: 3.7 (calc) (ref <5.0)
Triglycerides: 133 mg/dL (ref <150)

## 2019-09-12 ENCOUNTER — Other Ambulatory Visit: Payer: Self-pay | Admitting: Internal Medicine

## 2019-09-12 ENCOUNTER — Encounter: Payer: Self-pay | Admitting: Internal Medicine

## 2019-09-12 MED ORDER — INSULIN ASPART 100 UNIT/ML FLEXPEN: PEN_INJECTOR | SUBCUTANEOUS | 11 refills | Status: DC

## 2019-09-13 DIAGNOSIS — M6530 Trigger finger, unspecified finger: Secondary | ICD-10-CM | POA: Diagnosis not present

## 2019-09-13 DIAGNOSIS — Z4789 Encounter for other orthopedic aftercare: Secondary | ICD-10-CM | POA: Diagnosis not present

## 2019-09-14 ENCOUNTER — Telehealth: Payer: Self-pay

## 2019-09-14 NOTE — Telephone Encounter (Signed)
Philemon Kingdom, MD  Cardell Peach I, CMA Jerilynn Mages, Kim please submit a preauthorization for her Ozempic (please mention that she has been on this with good results and she also has CAD, so she absolutely needs to have the medication...).  Ty,  C

## 2019-09-18 NOTE — Telephone Encounter (Signed)
PA attempted via CoverMyMeds, PA canceled due to medication being covered under her plan without needing a PA>

## 2019-09-18 NOTE — Telephone Encounter (Signed)
Noted  

## 2019-09-27 DIAGNOSIS — M79641 Pain in right hand: Secondary | ICD-10-CM | POA: Diagnosis not present

## 2019-10-01 ENCOUNTER — Other Ambulatory Visit: Payer: Self-pay | Admitting: Family Medicine

## 2019-10-01 DIAGNOSIS — I10 Essential (primary) hypertension: Secondary | ICD-10-CM

## 2019-10-15 ENCOUNTER — Encounter: Payer: Self-pay | Admitting: *Deleted

## 2019-10-26 DIAGNOSIS — E113213 Type 2 diabetes mellitus with mild nonproliferative diabetic retinopathy with macular edema, bilateral: Secondary | ICD-10-CM | POA: Diagnosis not present

## 2019-10-26 LAB — HM DIABETES EYE EXAM

## 2019-11-06 ENCOUNTER — Ambulatory Visit (INDEPENDENT_AMBULATORY_CARE_PROVIDER_SITE_OTHER): Payer: Medicare HMO | Admitting: Family Medicine

## 2019-11-06 ENCOUNTER — Encounter: Payer: Self-pay | Admitting: Family Medicine

## 2019-11-06 ENCOUNTER — Other Ambulatory Visit: Payer: Self-pay

## 2019-11-06 VITALS — BP 146/78 | HR 65 | Resp 16 | Ht 65.0 in | Wt 192.0 lb

## 2019-11-06 DIAGNOSIS — F5102 Adjustment insomnia: Secondary | ICD-10-CM

## 2019-11-06 DIAGNOSIS — L299 Pruritus, unspecified: Secondary | ICD-10-CM

## 2019-11-06 DIAGNOSIS — J3089 Other allergic rhinitis: Secondary | ICD-10-CM

## 2019-11-06 DIAGNOSIS — F419 Anxiety disorder, unspecified: Secondary | ICD-10-CM | POA: Diagnosis not present

## 2019-11-06 DIAGNOSIS — M7989 Other specified soft tissue disorders: Secondary | ICD-10-CM

## 2019-11-06 DIAGNOSIS — I10 Essential (primary) hypertension: Secondary | ICD-10-CM | POA: Diagnosis not present

## 2019-11-06 MED ORDER — CETIRIZINE HCL 10 MG PO TBDP
ORAL_TABLET | ORAL | 5 refills | Status: DC
Start: 2019-11-06 — End: 2023-08-03

## 2019-11-06 MED ORDER — FUROSEMIDE 20 MG PO TABS: 20.0000 mg | ORAL_TABLET | Freq: Every day | ORAL | 3 refills | Status: DC

## 2019-11-06 MED ORDER — POTASSIUM CHLORIDE CRYS ER 20 MEQ PO TBCR: 20.0000 meq | EXTENDED_RELEASE_TABLET | Freq: Every day | ORAL | 3 refills | Status: DC

## 2019-11-06 MED ORDER — HYDROXYZINE HCL 10 MG PO TABS: ORAL_TABLET | ORAL | 1 refills | Status: DC

## 2019-11-06 NOTE — Patient Instructions (Signed)
F/U in October as before , call if you need me sooner  New for blood pressure and leg swelling are furosemide and potassium  New for itch is hydroxyzine  Symptoms  around eyes and forehead are due to allergies  It is important that you exercise regularly at least 30 minutes 5 times a week. If you develop chest pain, have severe difficulty breathing, or feel very tired, stop exercising immediately and seek medical attention    Think about what you will eat, plan ahead. Choose " clean, green, fresh or frozen" over canned, processed or packaged foods which are more sugary, salty and fatty. 70 to 75% of food eaten should be vegetables and fruit. Three meals at set times with snacks allowed between meals, but they must be fruit or vegetables. Aim to eat over a 12 hour period , example 7 am to 7 pm, and STOP after  your last meal of the day. Drink water,generally about 64 ounces per day, no other drink is as healthy. Fruit juice is best enjoyed in a healthy way, by EATING the fruit. Thanks for choosing [facility], we consider it a privelige to serve you.

## 2019-11-07 ENCOUNTER — Ambulatory Visit: Payer: Medicare HMO | Admitting: Gastroenterology

## 2019-11-13 ENCOUNTER — Other Ambulatory Visit: Payer: Self-pay | Admitting: Family Medicine

## 2019-11-18 ENCOUNTER — Encounter: Payer: Self-pay | Admitting: Family Medicine

## 2019-11-18 ENCOUNTER — Other Ambulatory Visit: Payer: Self-pay | Admitting: Internal Medicine

## 2019-11-18 DIAGNOSIS — L299 Pruritus, unspecified: Secondary | ICD-10-CM | POA: Insufficient documentation

## 2019-11-18 DIAGNOSIS — E1159 Type 2 diabetes mellitus with other circulatory complications: Secondary | ICD-10-CM

## 2019-11-18 DIAGNOSIS — M7989 Other specified soft tissue disorders: Secondary | ICD-10-CM | POA: Insufficient documentation

## 2019-11-18 NOTE — Assessment & Plan Note (Signed)
currently uncontrolled , start bedtime hydroxyzine

## 2019-11-18 NOTE — Assessment & Plan Note (Signed)
Generalized due to anxiety and stress, start hydroxyzine, consider return to therapy

## 2019-11-18 NOTE — Assessment & Plan Note (Signed)
Start daily hyroxyzine , continue daily SSRI

## 2019-11-18 NOTE — Assessment & Plan Note (Signed)
Increased symptoms, continue current regime, hydroxyzine will aid

## 2019-11-18 NOTE — Progress Notes (Signed)
   Jennifer Blackburn     MRN: [id number]      DOB: 1945/07/12   HPI Jennifer Blackburn is here c/o itchy skin, no rash, but itch disturbs her sleep, also excess sinus headaches and puffy, swollen watery eyes. No fever , chills , sore throat or cough C/o leg swelling x 2 weeks during the day , resolves overnight. Denies PND, orthopnea, chest pain or palpitations ROS  Denies abdominal pain, nausea, vomiting,diarrhea or constipation.   Denies dysuria, frequency, hesitancy or incontinence. Denies joint pain, swelling and limitation in mobility. Denies headaches, seizures, numbness, or tingling. Denies depression, c/o anxiety and  insomnia.   PE  BP (!) 146/78   Pulse 65   Resp 16   Ht 5\' 5"  (1.651 m)   Wt 192 lb (87.1 kg)   LMP 11/11/2016   SpO2 97%   BMI 31.95 kg/m     Patient alert and oriented and in no cardiopulmonary distress.  HEENT: No facial asymmetry, EOMI,     Neck supple .  Chest: Clear to auscultation bilaterally.  CVS: S1, S2 no murmurs, no S3.Regular rate.  ABD: Soft non tender.   Ext: one plus edema bilaterally MS: Adequate though reduced ROM spine, shoulders, hips and knees.  Skin: Intact, no ulcerations or rash noted.  Psych: Good eye contact, normal affect. Memory intact not anxious or depressed appearing.  CNS: CN 2-12 intact, power,  normal throughout.no focal deficits noted.   Assessment & Plan  Hypertension Elevated with leg swelling, start daily lasix and potassium  DASH diet and commitment to daily physical activity for a minimum of 30 minutes discussed and encouraged, as a part of hypertension management. The importance of attaining a healthy weight is also discussed.  BP/Weight 11/06/2019 08/31/2019 08/29/2019 08/21/2019 08/15/2019 08/06/2019 0/76/2263  Systolic BP 335 456 256 389 373 428 768  Diastolic BP 78 68 70 60 64 63 63  Wt. (Lbs) 192 185 187 182 182 184 184.4  BMI 31.95 30.79 31.12 34.96 30.29 30.62 30.69       Leg swelling Lasix daily  and potassium  Anxiety Start daily hyroxyzine , continue daily SSRI  Pruritus Generalized due to anxiety and stress, start hydroxyzine, consider return to therapy  Allergic rhinitis Increased symptoms, continue current regime, hydroxyzine will aid  Insomnia due to stress currently uncontrolled , start bedtime hydroxyzine

## 2019-11-18 NOTE — Assessment & Plan Note (Signed)
Lasix daily and potassium

## 2019-11-18 NOTE — Assessment & Plan Note (Signed)
Elevated with leg swelling, start daily lasix and potassium  DASH diet and commitment to daily physical activity for a minimum of 30 minutes discussed and encouraged, as a part of hypertension management. The importance of attaining a healthy weight is also discussed.  BP/Weight 11/06/2019 08/31/2019 08/29/2019 08/21/2019 08/15/2019 08/06/2019 6/59/9357  Systolic BP 017 793 903 009 233 007 622  Diastolic BP 78 68 70 60 64 63 63  Wt. (Lbs) 192 185 187 182 182 184 184.4  BMI 31.95 30.79 31.12 34.96 30.29 30.62 30.69

## 2019-11-19 ENCOUNTER — Other Ambulatory Visit: Payer: Self-pay

## 2019-11-23 ENCOUNTER — Other Ambulatory Visit: Payer: Self-pay | Admitting: Cardiovascular Disease

## 2019-11-23 ENCOUNTER — Other Ambulatory Visit: Payer: Self-pay | Admitting: Family Medicine

## 2019-11-27 ENCOUNTER — Other Ambulatory Visit: Payer: Self-pay | Admitting: Family Medicine

## 2019-12-19 NOTE — Progress Notes (Signed)
Referring Provider: Fayrene Helper, MD Primary Care Physician:  Fayrene Helper, MD Primary GI Physician: Dr. Gala Romney  Chief Complaint  Patient presents with  . Diarrhea    doing fine  . Gastroesophageal Reflux    doing fine    HPI:   Jennifer Blackburn is a 74 y.o. female with history of GERD, dysphagia, gastric ulcer in November 2020, and tubular adenoma in 2015. TCS up to date in November 2020 with normal exam, no recommendations to repeat due to age. Also with diarrhea in the setting of Ozempic that self resolved. She presents today for follow-up s/p EGD.   Last seen in our office 05/07/19 for the same. GERD well controlled on Protonix BID. Dysphagia improved, occurring rarely with breads. Bowels moving well. No other significant GI symptoms. She was concerned about fatigue. Had seen Endocrinology who felt it may be her thyroid. Plan included CBC, CMP, EGD for follow-up of gastric ulcer, avoid NSAIDs, counseled on separating Protonix from synthroid 3-4 hours.   CBC with hemoglobin 12.2 (improved).  CMP essentially normal expect for glucose elevated at 153.   EGD 08/21/19: Normal esophagus. Previously noted gastric ulcer completely healed. Normal examined duodenum.   Today:   GERD: Decreased Protonix to once a day after her EGD. This is doing very well for her.  Again asking if she can resume baby aspirin.  Also asking if she can eat tomatoes and chocolate.  Dysphagia: None.   No abdominal pain, nausea or vomiting, blood in the stool or black stools. No NSAIDs other than baby aspirin. BMs daily. No diarrhea or constipation.   Having some trouble with arthritis. Thinks pravastatin may be causing joint/muscle pain.    Past Medical History:  Diagnosis Date  . Anxiety   . Arthritis   . CAD (coronary artery disease)   . Depression   . Diabetes mellitus type II    without complication  . DJD (degenerative joint disease) of lumbar spine   . Hypercholesteremia   .  Hyperlipidemia   . Hypertension    benign   . Hypothyroidism   . Thyroid cancer ([place]) 2001    Past Surgical History:  Procedure Laterality Date  . ABDOMINAL HYSTERECTOMY    . CARDIAC CATHETERIZATION    . CATARACT EXTRACTION W/PHACO Right 07/17/2012   Procedure: CATARACT EXTRACTION PHACO AND INTRAOCULAR LENS PLACEMENT (IOC);  Surgeon: Tonny Branch, MD;  Location: [facility];  Service: Ophthalmology;  Laterality: Right;  CDE:25.51  . CATARACT EXTRACTION W/PHACO Left 09/17/2016   Procedure: CATARACT EXTRACTION PHACO AND INTRAOCULAR LENS PLACEMENT LEFT EYE;  Surgeon: Tonny Branch, MD;  Location: [facility];  Service: Ophthalmology;  Laterality: Left;  CDE: 19.23  . COLONOSCOPY    . COLONOSCOPY N/A 01/15/2014   Procedure: COLONOSCOPY;  Surgeon: Daneil Dolin, MD;  Location: [facility];  Service: Endoscopy;  Laterality: N/A;  9:00 AM  . COLONOSCOPY WITH PROPOFOL N/A 01/25/2019   Procedure: COLONOSCOPY WITH PROPOFOL;  Surgeon: Daneil Dolin, MD; diverticulosis in the entire colon, otherwise normal exam.  No repeat colonoscopy due to age.  . DOPPLER ECHOCARDIOGRAPHY    . ESOPHAGOGASTRODUODENOSCOPY (EGD) WITH PROPOFOL N/A 01/25/2019   Procedure: ESOPHAGOGASTRODUODENOSCOPY (EGD) WITH PROPOFOL;  Surgeon: Daneil Dolin, MD; normal esophagus without dilation due to inability to pass dilator beyond the hypopharynx, 1 small nonbleeding gastric ulcer s/p biopsied, otherwise normal exam.  Pathology with ulcer with reactive changes, no H. pylori, metaplasia, dysplasia, or malignancy.  . ESOPHAGOGASTRODUODENOSCOPY (EGD) WITH PROPOFOL  N/A 08/21/2019   Procedure: ESOPHAGOGASTRODUODENOSCOPY (EGD) WITH PROPOFOL;  Surgeon: Daneil Dolin, MD; Normal esophagus. Previously noted gastric ulcer completely healed. Normal examined duodenum.   . JOINT REPLACEMENT  07/01/2010   left hip  . KNEE SURGERY Right    arthroscopy  . left hip replaced  07/01/2010  . SPINE SURGERY  2006   cervical  . stress dipyridamole myocardial  perfusion    . THYROIDECTOMY    . TONSILLECTOMY    . VESICOVAGINAL FISTULA CLOSURE W/ TAH      Current Outpatient Medications  Medication Sig Dispense Refill  . acetaminophen (TYLENOL) 500 MG tablet Take 1,000 mg by mouth every 6 (six) hours as needed (for pain.).    Marland Kitchen Cetirizine HCl 10 MG TBDP Take one tablet by mouth two times daily for allergy 60 tablet 5  . Cholecalciferol (VITAMIN D3) 50 MCG (2000 UT) TABS Take 2,000 Units by mouth daily.    . ferrous sulfate 325 (65 FE) MG tablet Take 325 mg by mouth daily at 12 noon.     . fluticasone (FLONASE) 50 MCG/ACT nasal spray Place 2 sprays into both nostrils daily. 16 g 6  . furosemide (LASIX) 20 MG tablet Take 1 tablet (20 mg total) by mouth daily. 30 tablet 3  . gabapentin (NEURONTIN) 100 MG capsule TAKE 1 CAPSULE(100 MG) BY MOUTH THREE TIMES DAILY (Patient taking differently: Take 100 mg by mouth See admin instructions. Take 1 capsule (100 mg) by mouth scheduled in the morning, then take 1 capsule (100 mg) by mouth twice daily if needed for pain.) 270 capsule 1  . glucose blood (ACCU-CHEK AVIVA PLUS) test strip Use as instructed two times daily dx E11.65 150 each 5  . hydrOXYzine (ATARAX/VISTARIL) 10 MG tablet TAKE ONE TABLET TWICE DAILY AND TWO TABLETS AT BEDTIME AS NEEDED, FOR GENERALIZED ITCHING 360 tablet 1  . imipramine (TOFRANIL) 25 MG tablet TAKE 4 TABLETS BY MOUTH AT BEDTIME 360 tablet 0  . insulin aspart (NOVOLOG) 100 UNIT/ML FlexPen Inject 6-10 units under the skin 15 min before dinner 15 mL 11  . insulin glargine, 1 Unit Dial, (TOUJEO SOLOSTAR) 300 UNIT/ML Solostar Pen Inject 30 units in the AM and 40 units in the PM 21 mL 2  . Insulin Pen Needle 32G X 4 MM MISC Use 4x a day 300 each 3  . isosorbide mononitrate (IMDUR) 30 MG 24 hr tablet TAKE HALF A TABLET BY MOUTH EVERY DAY (Patient taking differently: Take 15 mg by mouth daily. ) 45 tablet 3  . Lancets (ACCU-CHEK MULTICLIX) lancets Use as instructed three times daily dx 250.01  100 each 5  . Melatonin 1 MG CAPS Take one capsule at night 30 capsule 5  . metFORMIN (GLUCOPHAGE) 1000 MG tablet TAKE 1 TABLET BY MOUTH TWICE A DAY 180 tablet 1  . metoprolol tartrate (LOPRESSOR) 50 MG tablet TAKE 1 TABLET BY MOUTH TWICE A DAY 180 tablet 2  . OZEMPIC, 0.25 OR 0.5 MG/DOSE, 2 MG/1.5ML SOPN INJECT 0.5 MG INTO THE SKIN ONCE A WEEK. 3 pen 2  . pantoprazole (PROTONIX) 40 MG tablet Take 1 tablet (40 mg total) by mouth daily. 90 tablet 1  . PARoxetine (PAXIL) 20 MG tablet TAKE 1 TABLET BY MOUTH EVERY DAY (Patient taking differently: Take 20 mg by mouth daily. ) 90 tablet 1  . potassium chloride SA (KLOR-CON) 20 MEQ tablet Take 1 tablet (20 mEq total) by mouth daily. 30 tablet 3  . pravastatin (PRAVACHOL) 80 MG tablet TAKE 1  TABLET BY MOUTH EVERY DAY 90 tablet 1  . quinapril (ACCUPRIL) 10 MG tablet TAKE 1 TABLET BY MOUTH EVERY DAY 90 tablet 1  . simethicone (PHAZYME) 125 MG chewable tablet Chew 1 tablet (125 mg total) by mouth every 6 (six) hours as needed for flatulence. 30 tablet 0  . SYNTHROID 137 MCG tablet Take 1 tablet (137 mcg total) by mouth daily before breakfast. 45 tablet 3  . verapamil (CALAN-SR) 180 MG CR tablet TAKE 1 TABLET BY MOUTH EVERYDAY AT BEDTIME 30 tablet 1  . vitamin B-12 (CYANOCOBALAMIN) 1000 MCG tablet Take 1 tablet (1,000 mcg total) by mouth daily. 30 tablet 5  . aspirin EC 81 MG tablet Take 1 tablet (81 mg total) by mouth daily. (Patient not taking: Reported on 12/20/2019)     No current facility-administered medications for this visit.    Allergies as of 12/20/2019 - Review Complete 12/20/2019  Allergen Reaction Noted  . Lipitor [atorvastatin calcium] Other (See Comments) 02/07/2019  . Daypro [oxaprozin] Hives 08/23/2016  . Nsaids Hives 04/14/2007  . Sulfonamide derivatives Hives 04/14/2007    Family History  Problem Relation Age of Onset  . Heart attack Father   . Heart failure Mother   . Asthma Daughter   . Sleep apnea Son        CPAP  . Colon  cancer Neg Hx     Social History   Socioeconomic History  . Marital status: Married    Spouse name: Not on file  . Number of children: 2  . Years of education: 86  . Highest education level: Not on file  Occupational History  . Occupation: retired     Comment: bank  Tobacco Use  . Smoking status: Passive Smoke Exposure - Never Smoker  . Smokeless tobacco: Never Used  . Tobacco comment: Husband smokes in the home  Vaping Use  . Vaping Use: Never used  Substance and Sexual Activity  . Alcohol use: No  . Drug use: No  . Sexual activity: Never  Other Topics Concern  . Not on file  Social History Narrative   Lives with husband, at home   No caffeine   Social Determinants of Health   Financial Resource Strain:   . Difficulty of Paying Living Expenses: Not on file  Food Insecurity: No Food Insecurity  . Worried About Charity fundraiser in the Last Year: Never true  . Ran Out of Food in the Last Year: Never true  Transportation Needs: No Transportation Needs  . Lack of Transportation (Medical): No  . Lack of Transportation (Non-Medical): No  Physical Activity: Insufficiently Active  . Days of Exercise per Week: 4 days  . Minutes of Exercise per Session: 30 min  Stress: No Stress Concern Present  . Feeling of Stress : Not at all  Social Connections: Socially Integrated  . Frequency of Communication with Friends and Family: More than three times a week  . Frequency of Social Gatherings with Friends and Family: Once a week  . Attends Religious Services: More than 4 times per year  . Active Member of Clubs or Organizations: Yes  . Attends Archivist Meetings: More than 4 times per year  . Marital Status: Married    Review of Systems: Gen: Denies fever, chills, cold or flulike symptoms, presyncope, syncope. CV: Denies chest pain or palpitations Resp: Denies dyspnea or cough GI: See HPI Heme: See HPI  Physical Exam: BP 137/73   Pulse 70   Temp 97.8 F  (  36.6 C) (Oral)   Ht 5\' 5"  (1.651 m)   Wt 189 lb 3.2 oz (85.8 kg)   LMP 11/11/2016   BMI 31.48 kg/m  General:   Alert and oriented. No distress noted. Pleasant and cooperative.  Head:  Normocephalic and atraumatic. Eyes:  Conjuctiva clear without scleral icterus. Heart:  S1, S2 present without murmurs appreciated. Lungs:  Clear to auscultation bilaterally. No wheezes, rales, or rhonchi. No distress.  Abdomen:  +BS, soft, non-tender and non-distended. No rebound or guarding. No HSM or masses noted. Msk:  Symmetrical without gross deformities. Normal posture. Extremities:  Without edema. Neurologic:  Alert and  oriented x4 Psych:  Normal mood and affect.

## 2019-12-20 ENCOUNTER — Other Ambulatory Visit: Payer: Self-pay

## 2019-12-20 ENCOUNTER — Ambulatory Visit (INDEPENDENT_AMBULATORY_CARE_PROVIDER_SITE_OTHER): Payer: Medicare HMO | Admitting: Gastroenterology

## 2019-12-20 ENCOUNTER — Encounter: Payer: Self-pay | Admitting: Gastroenterology

## 2019-12-20 VITALS — BP 137/73 | HR 70 | Temp 97.8°F | Ht 65.0 in | Wt 189.2 lb

## 2019-12-20 DIAGNOSIS — K219 Gastro-esophageal reflux disease without esophagitis: Secondary | ICD-10-CM

## 2019-12-20 DIAGNOSIS — R1310 Dysphagia, unspecified: Secondary | ICD-10-CM | POA: Diagnosis not present

## 2019-12-20 DIAGNOSIS — Z8719 Personal history of other diseases of the digestive system: Secondary | ICD-10-CM

## 2019-12-20 DIAGNOSIS — Z8711 Personal history of peptic ulcer disease: Secondary | ICD-10-CM

## 2019-12-20 NOTE — Assessment & Plan Note (Addendum)
Well controlled on Protonix 40 mg daily. No breakthrough symptoms. No alarm symptoms.   Plan: Continue Protonix 40 mg daily.  Advised she may resume eating tomatoes and chocolate in moderation.  If these items cause any problem with reflux, she will need to avoid them for symptom management. Counseled on common GERD triggers.   Follow-up in 6 months.

## 2019-12-20 NOTE — Assessment & Plan Note (Signed)
Resolved with improvement in GERD. Advised to continue Protonix 40 mg daily and monitor for return of symptoms. Follow-up in 6 months.

## 2019-12-20 NOTE — Patient Instructions (Addendum)
Continue taking Protonix 40 mg daily 30 minutes before breakfast.  You may resume 81 mg aspirin daily.  Avoid all other NSAID products including ibuprofen, Aleve, Advil, Goody powders, naproxen, and anything that says "NSAID" on the package.  You may resume eating tomatoes and chocolate in moderation.  If these items cause any problem with reflux, you will need to avoid them for symptom management.  In general, try to avoid common reflux triggers including fried, fatty, greasy, spicy, citrus foods, caffeine and carbonated beverages.  Again, this is in moderation and does not have to be completely eliminated from your diet.  We will plan to see back in 6 months.  Please call with questions or concerns prior.  Aliene Altes, PA-C [facility]

## 2019-12-20 NOTE — Assessment & Plan Note (Addendum)
History of gastric ulcer in November 2020. Repeat EGD in June 2021 with previously noted gastric ulcer completely healed. She had decreased Protonix to once daily and is clinically doing very well. No alarm symptoms. Avoiding all NSAIDs and asking if she can resume baby aspirin.   Advised to continue Protonix 40 mg daily. May resume baby aspirin. Avoid all other NSAIDs.

## 2019-12-25 ENCOUNTER — Other Ambulatory Visit: Payer: Self-pay

## 2019-12-25 ENCOUNTER — Encounter: Payer: Self-pay | Admitting: Family Medicine

## 2019-12-25 ENCOUNTER — Ambulatory Visit (INDEPENDENT_AMBULATORY_CARE_PROVIDER_SITE_OTHER): Payer: Medicare HMO | Admitting: Family Medicine

## 2019-12-25 VITALS — BP 125/75 | HR 95 | Resp 16 | Ht 65.0 in | Wt 187.0 lb

## 2019-12-25 DIAGNOSIS — E1159 Type 2 diabetes mellitus with other circulatory complications: Secondary | ICD-10-CM

## 2019-12-25 DIAGNOSIS — M7989 Other specified soft tissue disorders: Secondary | ICD-10-CM

## 2019-12-25 DIAGNOSIS — E669 Obesity, unspecified: Secondary | ICD-10-CM

## 2019-12-25 DIAGNOSIS — M5416 Radiculopathy, lumbar region: Secondary | ICD-10-CM

## 2019-12-25 DIAGNOSIS — M5410 Radiculopathy, site unspecified: Secondary | ICD-10-CM

## 2019-12-25 DIAGNOSIS — Z23 Encounter for immunization: Secondary | ICD-10-CM | POA: Diagnosis not present

## 2019-12-25 DIAGNOSIS — M791 Myalgia, unspecified site: Secondary | ICD-10-CM | POA: Diagnosis not present

## 2019-12-25 DIAGNOSIS — I10 Essential (primary) hypertension: Secondary | ICD-10-CM

## 2019-12-25 NOTE — Patient Instructions (Addendum)
Please reschedule follow up to early December call if you need me before  Flu vaccine today  The pain you are having I believe is from your low back with sciatica  I am referring you to Dr Melven Sartorius office for further evaluation and management  Please stop the pravastatin as we discussed and see if this causes you to have less pain. If it does not and your blood work looks good, then I will recommend that you resume the pravastatin  as this is meant to help to protect your heart as you are diabetic  Labs today, ESR, CK and chem 7 and eGFR non fasting. These are checking muscle damage and kidney function Continue once daily furosemide and potassium as you are  Hope you have some relief soon, no new medications from this office as you cannot take anti inflammatory pain medication by mouth because of h/o stomach ulcer  Thanks for choosing [facility], we consider it a privelige to serve you.

## 2019-12-25 NOTE — Progress Notes (Signed)
Jennifer Blackburn     MRN: [id number]      DOB: [birth year]   HPI Jennifer Blackburn Tis here with a 9 month h/o intermittent generalized pains and aches, flare in the last 5 to 6 weeks. Concerned that statin is contributing to her symptoms, reports increased pain and stiffness and difficulty with ambulation   ROS Denies recent fever or chills. Denies sinus pressure, nasal congestion, ear pain or sore throat. Denies chest congestion, productive cough or wheezing. Denies chest pains, palpitations and leg swelling Denies abdominal pain, nausea, vomiting,diarrhea or constipation.   Denies dysuria, frequency, hesitancy or incontinence. Main pain is low back to left ankle rated from 8 to 10, disturbs sleep and limits mobility, right leg also but not as sever. Upper extremities are fine , has chronic right shoulder pain which Ortho is taking care of Denies headaches, seizures, Denies depression, anxiety or insomnia. Denies skin break down or rash.   PE  BP 125/75   Pulse 95   Resp 16   Ht '5\' 5"'  (1.651 m)   Wt 187 lb (84.8 kg)   LMP 11/11/2016   SpO2 95%   BMI 31.12 kg/m   Patient alert and oriented and in no cardiopulmonary distress.  HEENT: No facial asymmetry, EOMI,     Neck supple .  Chest: Clear to auscultation bilaterally.  CVS: S1, S2 no murmurs, no S3.Regular rate.  ABD: Soft non tender.   Ext: No edema  MS: decreased  ROM spine, shoulders, hips and knees.  Skin: Intact, no ulcerations or rash noted.  Psych: Good eye contact, normal affect. Memory intact not anxious or depressed appearing.  CNS: CN 2-12 intact, power,  normal throughout.no focal deficits noted.   Assessment & Plan  Leg swelling Improved with daily lasix , continue same  Back pain with radiculopathy Progressive debilitating pain radiaitng down left leg in particular, has established arhtiritc and disc disease Refer to  Neurosurgery , who has managed her in the past  Muscle pain Check CK and ESR,  evemn f normal, pt to hold statin for 1 week to see if there is symptom improvement. The benfit of statin in heart protection is discused, she understands  Hypertension Controlled, no change in medication DASH diet and commitment to daily physical activity for a minimum of 30 minutes discussed and encouraged, as a part of hypertension management. The importance of attaining a healthy weight is also discussed.  BP/Weight 12/25/2019 12/20/2019 11/06/2019 08/31/2019 08/29/2019 08/21/2019 1/74/0814  Systolic BP 481 856 314 970 263 785 885  Diastolic BP 75 73 78 68 70 60 64  Wt. (Lbs) 187 189.2 192 185 187 182 182  BMI 31.12 31.48 31.95 30.79 31.12 34.96 30.29       Obesity (BMI 30.0-34.9)  Patient re-educated about  the importance of commitment to a  minimum of 150 minutes of exercise per week as able.  The importance of healthy food choices with portion control discussed, as well as eating regularly and within a 12 hour window most days. The need to choose "clean , green" food 50 to 75% of the time is discussed, as well as to make water the primary drink and set a goal of 64 ounces water daily.    Weight /BMI 12/25/2019 12/20/2019 11/06/2019  WEIGHT 187 lb 189 lb 3.2 oz 192 lb  HEIGHT '5\' 5"'  '5\' 5"'  '5\' 5"'   BMI 31.12 kg/m2 31.48 kg/m2 31.95 kg/m2      Type 2 diabetes mellitus with vascular  disease ([place]) Uncontrolled and followed by [name] is reminded of the importance of commitment to daily physical activity for 30 minutes or more, as able and the need to limit carbohydrate intake to 30 to 60 grams per meal to help with blood sugar control.   The need to take medication as prescribed, test blood sugar as directed, and to call between visits if there is a concern that blood sugar is uncontrolled is also discussed.   Jennifer Blackburn is reminded of the importance of daily foot exam, annual eye examination, and good blood sugar, blood pressure and cholesterol control.  Diabetic Labs Latest Ref  Rng & Units 12/25/2019 09/04/2019 08/29/2019 08/15/2019 05/07/2019  HbA1c 4.0 - 5.6 % - - 8.4(A) - -  Microalbumin mg/dL - - - - -  Micro/Creat Ratio 0.0 - 30.0 mg/g creat - - - - -  Chol <200 mg/dL - 157 - - -  HDL > OR = 50 mg/dL - 43(L) - - -  Calc LDL mg/dL (calc) - 91 - - -  Triglycerides <150 mg/dL - 133 - - -  Creatinine 0.60 - 0.93 mg/dL 0.81 - - 0.79 0.85   BP/Weight 12/25/2019 12/20/2019 11/06/2019 08/31/2019 08/29/2019 08/21/2019 3/68/5992  Systolic BP 341 443 601 658 006 349 494  Diastolic BP 75 73 78 68 70 60 64  Wt. (Lbs) 187 189.2 192 185 187 182 182  BMI 31.12 31.48 31.95 30.79 31.12 34.96 30.29   Foot/eye exam completion dates Latest Ref Rng & Units 10/26/2019 08/29/2019  Eye Exam No Retinopathy Retinopathy(A) -  Foot exam Order - - -  Foot Form Completion - - Done

## 2019-12-26 LAB — BASIC METABOLIC PANEL WITH GFR
BUN: 13 mg/dL (ref 7–25)
CO2: 30 mmol/L (ref 20–32)
Calcium: 9.4 mg/dL (ref 8.6–10.4)
Chloride: 94 mmol/L — ABNORMAL LOW (ref 98–110)
Creat: 0.81 mg/dL (ref 0.60–0.93)
GFR, Est African American: 82 mL/min/{1.73_m2} (ref >=60)
GFR, Est Non African American: 71 mL/min/{1.73_m2} (ref >=60)
Glucose, Bld: 246 mg/dL — ABNORMAL HIGH (ref 65–99)
Potassium: 4.2 mmol/L (ref 3.5–5.3)
Sodium: 137 mmol/L (ref 135–146)

## 2019-12-26 LAB — CK: Total CK: 76 U/L (ref 29–143)

## 2019-12-26 LAB — SEDIMENTATION RATE: Sed Rate: 28 mm/h (ref 0–30)

## 2019-12-28 ENCOUNTER — Encounter: Payer: Self-pay | Admitting: Family Medicine

## 2019-12-29 ENCOUNTER — Other Ambulatory Visit: Payer: Self-pay | Admitting: Cardiovascular Disease

## 2019-12-29 NOTE — Assessment & Plan Note (Signed)
Improved with daily lasix , continue same

## 2019-12-29 NOTE — Assessment & Plan Note (Signed)
Progressive debilitating pain radiaitng down left leg in particular, has established arhtiritc and disc disease Refer to  Neurosurgery , who has managed her in the past

## 2019-12-29 NOTE — Assessment & Plan Note (Signed)
Controlled, no change in medication DASH diet and commitment to daily physical activity for a minimum of 30 minutes discussed and encouraged, as a part of hypertension management. The importance of attaining a healthy weight is also discussed.  BP/Weight 12/25/2019 12/20/2019 11/06/2019 08/31/2019 08/29/2019 08/21/2019 05/22/2199  Systolic BP 992 415 516 144 324 699 780  Diastolic BP 75 73 78 68 70 60 64  Wt. (Lbs) 187 189.2 192 185 187 182 182  BMI 31.12 31.48 31.95 30.79 31.12 34.96 30.29

## 2019-12-29 NOTE — Assessment & Plan Note (Signed)
Check CK and ESR, evemn f normal, pt to hold statin for 1 week to see if there is symptom improvement. The benfit of statin in heart protection is discused, she understands

## 2019-12-29 NOTE — Assessment & Plan Note (Signed)
  Patient re-educated about  the importance of commitment to a  minimum of 150 minutes of exercise per week as able.  The importance of healthy food choices with portion control discussed, as well as eating regularly and within a 12 hour window most days. The need to choose "clean , green" food 50 to 75% of the time is discussed, as well as to make water the primary drink and set a goal of 64 ounces water daily.    Weight /BMI 12/25/2019 12/20/2019 11/06/2019  WEIGHT 187 lb 189 lb 3.2 oz 192 lb  HEIGHT 5\' 5"  5\' 5"  5\' 5"   BMI 31.12 kg/m2 31.48 kg/m2 31.95 kg/m2

## 2019-12-29 NOTE — Assessment & Plan Note (Signed)
Uncontrolled and followed by Endo [name] is reminded of the importance of commitment to daily physical activity for 30 minutes or more, as able and the need to limit carbohydrate intake to 30 to 60 grams per meal to help with blood sugar control.   The need to take medication as prescribed, test blood sugar as directed, and to call between visits if there is a concern that blood sugar is uncontrolled is also discussed.   [name] is reminded of the importance of daily foot exam, annual eye examination, and good blood sugar, blood pressure and cholesterol control.  Diabetic Labs Latest Ref Rng & Units 12/25/2019 09/04/2019 08/29/2019 08/15/2019 05/07/2019  HbA1c 4.0 - 5.6 % - - 8.4(A) - -  Microalbumin mg/dL - - - - -  Micro/Creat Ratio 0.0 - 30.0 mg/g creat - - - - -  Chol <200 mg/dL - 157 - - -  HDL > OR = 50 mg/dL - 43(L) - - -  Calc LDL mg/dL (calc) - 91 - - -  Triglycerides <150 mg/dL - 133 - - -  Creatinine 0.60 - 0.93 mg/dL 0.81 - - 0.79 0.85   BP/Weight 12/25/2019 12/20/2019 11/06/2019 08/31/2019 08/29/2019 08/21/2019 1/50/5697  Systolic BP 948 016 553 748 270 786 754  Diastolic BP 75 73 78 68 70 60 64  Wt. (Lbs) 187 189.2 192 185 187 182 182  BMI 31.12 31.48 31.95 30.79 31.12 34.96 30.29   Foot/eye exam completion dates Latest Ref Rng & Units 10/26/2019 08/29/2019  Eye Exam No Retinopathy Retinopathy(A) -  Foot exam Order - - -  Foot Form Completion - - Done

## 2019-12-31 ENCOUNTER — Ambulatory Visit: Payer: Medicare HMO | Admitting: Family Medicine

## 2020-01-04 ENCOUNTER — Encounter: Payer: Self-pay | Admitting: Internal Medicine

## 2020-01-04 ENCOUNTER — Other Ambulatory Visit: Payer: Self-pay

## 2020-01-04 ENCOUNTER — Ambulatory Visit: Payer: Medicare HMO | Admitting: Internal Medicine

## 2020-01-04 VITALS — BP 122/62 | HR 71 | Ht 65.0 in | Wt 188.0 lb

## 2020-01-04 DIAGNOSIS — E1159 Type 2 diabetes mellitus with other circulatory complications: Secondary | ICD-10-CM | POA: Diagnosis not present

## 2020-01-04 DIAGNOSIS — E890 Postprocedural hypothyroidism: Secondary | ICD-10-CM

## 2020-01-04 DIAGNOSIS — C73 Malignant neoplasm of thyroid gland: Secondary | ICD-10-CM

## 2020-01-04 LAB — POCT GLYCOSYLATED HEMOGLOBIN (HGB A1C): Hemoglobin A1C: 9.7 % — AB (ref 4.0–5.6)

## 2020-01-04 NOTE — Progress Notes (Signed)
Patient ID: Jennifer Blackburn, female   DOB: 08-Feb-1946, 74 y.o.   MRN: [id number]   This visit occurred during the SARS-CoV-2 public health emergency.  Safety protocols were in place, including screening questions prior to the visit, additional usage of staff PPE, and extensive cleaning of exam room while observing appropriate contact time as indicated for disinfecting solutions.   HPI: Jennifer Blackburn is a 74 y.o.-year-old female, returning for follow-up for DM2, dx in 2001, insulin-dependent since 2017, uncontrolled, with long-term complications (DR, CAD) and also papillary thyroid cancer and postsurgical hypothyroidism.  Last visit 4 months ago.  She has back pain >> less active.  DM2: Reviewed HbA1c levels: Lab Results  Component Value Date   HGBA1C 8.4 (A) 08/29/2019   HGBA1C 9.3 (A) 05/03/2019   HGBA1C 8.8 (A) 12/28/2018   HGBA1C 8.9 (A) 09/21/2018   HGBA1C 8.4 (A) 05/26/2018   HGBA1C 8.0 (A) 02/07/2018   HGBA1C 8.9 08/06/2017   HGBA1C 8.0 10/20/2015   HGBA1C 9.0 (H) 11/14/2014   HGBA1C 9.0 (H) 07/10/2014  01/12/2018: HbA1c 8.7% 08/05/2017: HbA1c 8.9% 07/11/2017: HbA1c 9.1%  Pt is on a regimen of: - Metformin 1000 mg 2x a day with meals. - Toujeo 30 units in a.m. and 50 units at bedtime >> 30 units in a.m. and 40 units at bedtime - Lispro 8-10 mg 3x a day 15 min before dinner -restarted 08/2019 - Ozempic 0.5 mg weekly-started 01/2019 >> not affordable  - stopped 10/2019 She was on Ozempic 0.5 mg weekly in a.m. >> 1 mg  - constipation >> stopped 06/2018.  We added it back 09/2018. She was on Victoza >> $$$. She was on Jardiance >> $$$. She was on Actos >> swelling - stopped Spring 2019. She was on Glipizide XL 5 mg daily >> stopped 07/2017.  We retried glipizide 5 mg twice a day but had to stop 12/2018 due to lack of effect.  Pt checks her sugars 1-2 times daily: - am:  110-160 >> 120-140 >> 160-268 >> 99, 120-160 >> 99, 110, 130-220 - 2h after b'fast: n/c - before lunch:  n/c - 2h after lunch: n/c >> 265-300s >> n/c - before dinner: n/c >> 185-190 - 2h after dinner: n/c - bedtime: 230-240 >> 200s >> n/c >> 180-210 >> 160-260  - nighttime: n/c Lowest sugar was 110 >> 137 >> 99 >> 99; she has hypoglycemia awareness in the 60s. Highest sugar was 320 >> 240 >> 260.  Glucometer: AccuChek Aviva  Pt's meals are: - Breakfast:toast, cereal, milk; oatmeal; bacon + egg - Lunch: skips  - Dinner: hamburger, or chicken, or fish, + veggies, bread, water and tea - Snacks: 2- fruit She was seeing a dietitian in [place]. She walks for exercise.  -No CKD, last BUN/creatinine:  Lab Results  Component Value Date   BUN 13 12/25/2019   BUN 14 08/15/2019   CREATININE 0.81 12/25/2019   CREATININE 0.79 08/15/2019  On quinapril.  -+ HL; last set of lipids: Lab Results  Component Value Date   CHOL 157 09/04/2019   HDL 43 (L) 09/04/2019   LDLCALC 91 09/04/2019   TRIG 133 09/04/2019   CHOLHDL 3.7 09/04/2019  On pravastatin 80.  - last eye exam was in 10/2019: No DR-reportedly. Dr. Eulas Post in [place].  She has cataracts OU.   - no numbness and tingling in her feet. Has a podiatrist.   On ASA 81.  Pt has no FH of DM.  She also has a h/o Thyroid cancer (  2001), s/p RAI treatment, now with postsurgical hypothyroidism.  Previously on brand-name Synthroid, now on generic levothyroxine.  Pt is on levothyroxine 137 mcg daily (dose decreased at last visit), taken: - in am - fasting - at least 30 min from b'fast - no Ca, MVI - + PPIs (Protonix) now moved during the day - stopped iron  - not on Biotin  Reviewed her TFTs: Lab Results  Component Value Date   TSH 0.05 (L) 08/31/2019   TSH 0.04 (L) 12/28/2018   TSH 0.06 (L) 09/21/2018   TSH 0.61 05/26/2018   TSH 2.49 03/23/2016   TSH 0.070 (L) 10/13/2010   TSH 0.035 (L) 09/22/2006   TSH 0.015 (L) 06/17/2006  11/03/2017: TSH 0.044  Her thyroglobulin levels are detectable, but not increase: Lab Results   Component Value Date   THYROGLB 0.4 (L) 08/31/2019   THYROGLB 0.3 (L) 09/21/2018   THYROGLB 0.7 (L) 05/26/2018   Lab Results  Component Value Date   THGAB <1 08/31/2019   THGAB <1 09/21/2018   THGAB <1 05/26/2018   Reviewed imaging tests reports: Thyroid ultrasound (06/10/2018): 1.1 cm nonspecific calcified lesion in the right upper neck.  CT was recommended: There is no residual or recurrent tissue in the right or left thyroid beds. There is no evidence of abnormal adenopathy by short axis diameter measurement criteria There is a nonspecific calcified soft tissue mass measuring 1.1 cm in the right superior neck. This is of unknown significance. CT neck can be performed to further characterize.  CT (10/18/2018): Status post thyroidectomy. No soft tissue mass within the thyroidectomy bed. A 13 x 7 mm ovoid focus of calcification in the right aspect of the thyroidectomy bed likely corresponding with the finding on recent neck ultrasound and is unchanged as compared to neck CT 10/24/2007, benign.  No pathologically enlarged cervical chain lymph nodes. A nonenlarged calcified right supraclavicular lymph node is new from prior neck CT 10/24/2007 but also favored benign. Attention recommended on follow-up.  Reviewed records from Dr. Ronnald Collum: Patient is status post total thyroidectomy in 2004 1.3 cm papillary thyroid cancer of the right lobe, followed by 3x RAI tx's with  33 mCi I-131 and a fourth RAI dose inpatient: 125 mCi, at [facility].    Whole-body scan on 09/08/2007 showed an increase in the thyroid bed activity and the follow up study with PET scan 10/08/2007 showed mildly hypermetabolic cervical lymph nodes.    Repeat PET 05/10/2008 showed no significant changes felt likely to be due to to a reactive process.  11/03/2017: TSH 0.044, thyroglobulin 0.2, ATA <1.0  04/05/2017: TSH 0.021 (0.45-4.5), thyroglobulin 0.3 (by IMA), ATA <1.0  She also has HTN, anemia.  Her daughter lives in  [place] and she is sick.  Patient travels between here in [place] frequently.   ROS: Constitutional: no weight gain/no weight loss, no fatigue, no subjective hyperthermia, no subjective hypothermia Eyes: no blurry vision, no xerophthalmia ENT: no sore throat, + see HPI Cardiovascular: no CP/no SOB/no palpitations/no leg swelling Respiratory: no cough/no SOB/no wheezing Gastrointestinal: no N/no V/no D/no C/no acid reflux Musculoskeletal: no muscle aches/+ joint aches Skin: no rashes, no hair loss Neurological: no tremors/no numbness/no tingling/no dizziness  I reviewed pt's medications, allergies, PMH, social hx, family hx, and changes were documented in the history of present illness. Otherwise, unchanged from my initial visit note.  Past Medical History:  Diagnosis Date  . Anxiety   . Arthritis   . CAD (coronary artery disease)   . Depression   .  Diabetes mellitus type II    without complication  . DJD (degenerative joint disease) of lumbar spine   . Hypercholesteremia   . Hyperlipidemia   . Hypertension    benign   . Hypothyroidism   . Thyroid cancer ([place]) 2001   Past Surgical History:  Procedure Laterality Date  . ABDOMINAL HYSTERECTOMY    . CARDIAC CATHETERIZATION    . CATARACT EXTRACTION W/PHACO Right 07/17/2012   Procedure: CATARACT EXTRACTION PHACO AND INTRAOCULAR LENS PLACEMENT (IOC);  Surgeon: Tonny Branch, MD;  Location: [facility];  Service: Ophthalmology;  Laterality: Right;  CDE:25.51  . CATARACT EXTRACTION W/PHACO Left 09/17/2016   Procedure: CATARACT EXTRACTION PHACO AND INTRAOCULAR LENS PLACEMENT LEFT EYE;  Surgeon: Tonny Branch, MD;  Location: [facility];  Service: Ophthalmology;  Laterality: Left;  CDE: 19.23  . COLONOSCOPY    . COLONOSCOPY N/A 01/15/2014   Procedure: COLONOSCOPY;  Surgeon: Daneil Dolin, MD;  Location: [facility] SUITE;  Service: Endoscopy;  Laterality: N/A;  9:00 AM  . COLONOSCOPY WITH PROPOFOL N/A 01/25/2019   Procedure: COLONOSCOPY WITH  PROPOFOL;  Surgeon: Daneil Dolin, MD; diverticulosis in the entire colon, otherwise normal exam.  No repeat colonoscopy due to age.  . DOPPLER ECHOCARDIOGRAPHY    . ESOPHAGOGASTRODUODENOSCOPY (EGD) WITH PROPOFOL N/A 01/25/2019   Procedure: ESOPHAGOGASTRODUODENOSCOPY (EGD) WITH PROPOFOL;  Surgeon: Daneil Dolin, MD; normal esophagus without dilation due to inability to pass dilator beyond the hypopharynx, 1 small nonbleeding gastric ulcer s/p biopsied, otherwise normal exam.  Pathology with ulcer with reactive changes, no H. pylori, metaplasia, dysplasia, or malignancy.  . ESOPHAGOGASTRODUODENOSCOPY (EGD) WITH PROPOFOL N/A 08/21/2019   Procedure: ESOPHAGOGASTRODUODENOSCOPY (EGD) WITH PROPOFOL;  Surgeon: Daneil Dolin, MD; Normal esophagus. Previously noted gastric ulcer completely healed. Normal examined duodenum.   . JOINT REPLACEMENT  07/01/2010   left hip  . KNEE SURGERY Right    arthroscopy  . left hip replaced  07/01/2010  . SPINE SURGERY  2006   cervical  . stress dipyridamole myocardial perfusion    . THYROIDECTOMY    . TONSILLECTOMY    . VESICOVAGINAL FISTULA CLOSURE W/ TAH     Social History   Socioeconomic History  . Marital status: Married    Spouse name: Not on file  . Number of children: 2  . Years of education: 22  . Highest education level: Not on file  Occupational History  . Occupation: retired     Comment: bank  Tobacco Use  . Smoking status: Passive Smoke Exposure - Never Smoker  . Smokeless tobacco: Never Used  . Tobacco comment: Husband smokes in the home  Vaping Use  . Vaping Use: Never used  Substance and Sexual Activity  . Alcohol use: No  . Drug use: No  . Sexual activity: Never  Other Topics Concern  . Not on file  Social History Narrative   Lives with husband, at home   No caffeine   Social Determinants of Health   Financial Resource Strain:   . Difficulty of Paying Living Expenses: Not on file  Food Insecurity: No Food Insecurity  .  Worried About Charity fundraiser in the Last Year: Never true  . Ran Out of Food in the Last Year: Never true  Transportation Needs: No Transportation Needs  . Lack of Transportation (Medical): No  . Lack of Transportation (Non-Medical): No  Physical Activity: Insufficiently Active  . Days of Exercise per Week: 4 days  . Minutes of Exercise per Session: 30 min  Stress: No Stress Concern Present  . Feeling of Stress : Not at all  Social Connections: Socially Integrated  . Frequency of Communication with Friends and Family: More than three times a week  . Frequency of Social Gatherings with Friends and Family: Once a week  . Attends Religious Services: More than 4 times per year  . Active Member of Clubs or Organizations: Yes  . Attends Archivist Meetings: More than 4 times per year  . Marital Status: Married  Human resources officer Violence:   . Fear of Current or Ex-Partner: Not on file  . Emotionally Abused: Not on file  . Physically Abused: Not on file  . Sexually Abused: Not on file   Current Outpatient Medications  Medication Sig Dispense Refill  . acetaminophen (TYLENOL) 500 MG tablet Take 1,000 mg by mouth every 6 (six) hours as needed (for pain.).    Marland Kitchen aspirin EC 81 MG tablet Take 1 tablet (81 mg total) by mouth daily.    . Cetirizine HCl 10 MG TBDP Take one tablet by mouth two times daily for allergy 60 tablet 5  . Cholecalciferol (VITAMIN D3) 50 MCG (2000 UT) TABS Take 2,000 Units by mouth daily.    . ferrous sulfate 325 (65 FE) MG tablet Take 325 mg by mouth daily at 12 noon.     . fluticasone (FLONASE) 50 MCG/ACT nasal spray Place 2 sprays into both nostrils daily. 16 g 6  . furosemide (LASIX) 20 MG tablet Take 1 tablet (20 mg total) by mouth daily. 30 tablet 3  . gabapentin (NEURONTIN) 100 MG capsule TAKE 1 CAPSULE(100 MG) BY MOUTH THREE TIMES DAILY (Patient taking differently: Take 100 mg by mouth See admin instructions. Take 1 capsule (100 mg) by mouth scheduled in  the morning, then take 1 capsule (100 mg) by mouth twice daily if needed for pain.) 270 capsule 1  . glucose blood (ACCU-CHEK AVIVA PLUS) test strip Use as instructed two times daily dx E11.65 150 each 5  . hydrOXYzine (ATARAX/VISTARIL) 10 MG tablet TAKE ONE TABLET TWICE DAILY AND TWO TABLETS AT BEDTIME AS NEEDED, FOR GENERALIZED ITCHING 360 tablet 1  . imipramine (TOFRANIL) 25 MG tablet TAKE 4 TABLETS BY MOUTH AT BEDTIME 360 tablet 0  . insulin aspart (NOVOLOG) 100 UNIT/ML FlexPen Inject 6-10 units under the skin 15 min before dinner 15 mL 11  . insulin glargine, 1 Unit Dial, (TOUJEO SOLOSTAR) 300 UNIT/ML Solostar Pen Inject 30 units in the AM and 40 units in the PM 21 mL 2  . Insulin Pen Needle 32G X 4 MM MISC Use 4x a day 300 each 3  . isosorbide mononitrate (IMDUR) 30 MG 24 hr tablet TAKE HALF A TABLET BY MOUTH EVERY DAY (Patient taking differently: Take 15 mg by mouth daily. ) 45 tablet 3  . Lancets (ACCU-CHEK MULTICLIX) lancets Use as instructed three times daily dx 250.01 100 each 5  . Melatonin 1 MG CAPS Take one capsule at night 30 capsule 5  . metFORMIN (GLUCOPHAGE) 1000 MG tablet TAKE 1 TABLET BY MOUTH TWICE A DAY 180 tablet 1  . metoprolol tartrate (LOPRESSOR) 50 MG tablet TAKE 1 TABLET BY MOUTH TWICE A DAY 180 tablet 2  . OZEMPIC, 0.25 OR 0.5 MG/DOSE, 2 MG/1.5ML SOPN INJECT 0.5 MG INTO THE SKIN ONCE A WEEK. 3 pen 2  . pantoprazole (PROTONIX) 40 MG tablet Take 1 tablet (40 mg total) by mouth daily. 90 tablet 1  . PARoxetine (PAXIL) 20 MG tablet TAKE 1  TABLET BY MOUTH EVERY DAY (Patient taking differently: Take 20 mg by mouth daily. ) 90 tablet 1  . potassium chloride SA (KLOR-CON) 20 MEQ tablet Take 1 tablet (20 mEq total) by mouth daily. 30 tablet 3  . quinapril (ACCUPRIL) 10 MG tablet TAKE 1 TABLET BY MOUTH EVERY DAY 90 tablet 1  . simethicone (PHAZYME) 125 MG chewable tablet Chew 1 tablet (125 mg total) by mouth every 6 (six) hours as needed for flatulence. 30 tablet 0  . SYNTHROID  137 MCG tablet Take 1 tablet (137 mcg total) by mouth daily before breakfast. 45 tablet 3  . verapamil (CALAN-SR) 180 MG CR tablet TAKE 1 TABLET BY MOUTH EVERYDAY AT BEDTIME 30 tablet 0  . vitamin B-12 (CYANOCOBALAMIN) 1000 MCG tablet Take 1 tablet (1,000 mcg total) by mouth daily. 30 tablet 5   No current facility-administered medications for this visit.     Allergies  Allergen Reactions  . Lipitor [Atorvastatin Calcium] Other (See Comments)    Muscle aches  . Daypro [Oxaprozin] Hives  . Nsaids Hives  . Sulfonamide Derivatives Hives   Family History  Problem Relation Age of Onset  . Heart attack Father   . Heart failure Mother   . Asthma Daughter   . Sleep apnea Son        CPAP  . Colon cancer Neg Hx     PE: BP 122/62   Pulse 71   Ht 5\' 5"  (1.651 m)   Wt 188 lb (85.3 kg)   LMP 11/11/2016   SpO2 97%   BMI 31.28 kg/m  Wt Readings from Last 3 Encounters:  01/04/20 188 lb (85.3 kg)  12/25/19 187 lb (84.8 kg)  12/20/19 189 lb 3.2 oz (85.8 kg)   Constitutional: overweight, in NAD Eyes: PERRLA, EOMI, no exophthalmos ENT: moist mucous membranes, no thyromegaly, no cervical lymphadenopathy Cardiovascular: RRR, No MRG Respiratory: CTA B Gastrointestinal: abdomen soft, NT, ND, BS+ Musculoskeletal: no deformities, strength intact in all 4 Skin: moist, warm, no rashes Neurological: no tremor with outstretched hands, DTR normal in all 4  ASSESSMENT: 1. DM2, insulin-dependent, uncontrolled, with long-term complications - CAD - DR  2.  Papillary thyroid cancer  3.  Postsurgical hypothyroidism  PLAN:  1. Patient with longstanding, uncontrolled, type 2 diabetes, on Metformin, weekly GLP-1 receptor agonist, basal insulin and also mealtime insulin added at last visit.  At that time, HbA1c was still above target, at 8.4%, but improved.  Her sugars were better in the morning at night but they were still above target. -At today's visit, sugars are higher at all times of the day,  and more fluctuating in the morning.  Upon questioning, she came off Ozempic due to price but she feels that she will be able to afford it again after the first of the year.  For now, we gave her Trulicity 1.5 mg weekly samples.  I advised her that if the sugars do not improve after starting Trulicity, she may need to add lispro before each meal.  Discussed about the very low dose of lispro. - I suggested to:  Patient Instructions  Please continue: - Metformin 1000 mg 2x a day with meals. - Toujeo 30 units in a.m. and 40 units at bedtime  Please start: - Trulicity 1.5 mg weekly  If sugars do not improve after starting Trulicity, add: - Lispro 6-10 units 15 min before each meal  Please continue levothyroxine 137 mcg daily.  Take the thyroid hormone every day, with water, at least  30 minutes before breakfast, separated by at least 4 hours from: - acid reflux medications - calcium - iron - multivitamins  Please stop at the lab.  Please return in 3-4 months with your sugar log.   - we checked her HbA1c: 9.7% (higher) - advised to check sugars at different times of the day - 3x a day, rotating check times - advised for yearly eye exams >> she is UTD - return to clinic in 3-4 months    2.  Papillary thyroid cancer -Previous thyroid cancer records were reviewed from Dr. Ronnald Collum -She has metastatic thyroid cancer and she had 4 RAI treatments.  She also had increased signal on the PET scan from 2009, however, in 2010, another PET scan showed possible inflammatory lymph nodes in the area. -She continues to have detectable thyroglobulin levels.  Neck ultrasound showed a specific calcified lesion in the right upper neck and we also obtained a neck CT which showed a stable calcified mass in 2009, most likely benign -At this visit, we reviewed her thyroglobulin and ATA antibodies -we will recheck these at next visit  3.  Postsurgical hypothyroidism - latest thyroid labs reviewed with pt >>  TSH is suppressed: Lab Results  Component Value Date   TSH 0.05 (L) 08/31/2019   - she continues on LT4 137 mcg daily (dose decreased at last visit -did not return for labs afterwards...) - pt feels good on this dose. - we discussed about taking the thyroid hormone every day, with water, >30 minutes before breakfast, separated by >4 hours from acid reflux medications, calcium, iron, multivitamins. Pt. is taking it correctly. - will check thyroid tests today: TSH and fT4 - If labs are abnormal, she will need to return for repeat TFTs in 1.5 months  Pt did not stop at the lab...  Philemon Kingdom, MD PhD [facility] Endocrinology

## 2020-01-04 NOTE — Patient Instructions (Addendum)
Please continue: - Metformin 1000 mg 2x a day with meals. - Toujeo 30 units in a.m. and 40 units at bedtime  Please start: - Trulicity 1.5 mg weekly  If sugars do not improve after starting Trulicity, add: - Lispro 6-10 units 15 min before each meal  Please continue levothyroxine 137 mcg daily.  Take the thyroid hormone every day, with water, at least 30 minutes before breakfast, separated by at least 4 hours from: - acid reflux medications - calcium - iron - multivitamins  Please stop at the lab.  Please return in 3-4 months with your sugar log.

## 2020-01-07 DIAGNOSIS — M5416 Radiculopathy, lumbar region: Secondary | ICD-10-CM | POA: Diagnosis not present

## 2020-01-08 ENCOUNTER — Telehealth: Payer: Self-pay

## 2020-01-22 ENCOUNTER — Other Ambulatory Visit: Payer: Self-pay | Admitting: Family Medicine

## 2020-01-22 DIAGNOSIS — M549 Dorsalgia, unspecified: Secondary | ICD-10-CM

## 2020-01-23 ENCOUNTER — Telehealth: Payer: Self-pay | Admitting: Internal Medicine

## 2020-01-23 ENCOUNTER — Other Ambulatory Visit: Payer: Self-pay

## 2020-01-23 DIAGNOSIS — E1159 Type 2 diabetes mellitus with other circulatory complications: Secondary | ICD-10-CM

## 2020-01-23 MED ORDER — TOUJEO SOLOSTAR 300 UNIT/ML ~~LOC~~ SOPN: PEN_INJECTOR | SUBCUTANEOUS | 2 refills | Status: DC

## 2020-01-23 NOTE — Telephone Encounter (Signed)
Patient needs the dosing instructions to reflect that she takes her medication for 30 days.  The way the Toujeo dosage is written now comes out as a 38 day not 30 and pharmacy is charging her for the 8 days.  Any clarification needed please call patient at [phone]

## 2020-01-23 NOTE — Telephone Encounter (Signed)
I tried to redo the RX and sent back to pharmacy.

## 2020-01-24 ENCOUNTER — Other Ambulatory Visit: Payer: Self-pay | Admitting: Cardiovascular Disease

## 2020-01-25 ENCOUNTER — Other Ambulatory Visit: Payer: Self-pay | Admitting: Internal Medicine

## 2020-02-06 ENCOUNTER — Other Ambulatory Visit: Payer: Self-pay | Admitting: Family Medicine

## 2020-02-06 ENCOUNTER — Encounter (HOSPITAL_COMMUNITY): Payer: Self-pay | Admitting: Physical Therapy

## 2020-02-06 ENCOUNTER — Ambulatory Visit (HOSPITAL_COMMUNITY): Payer: Medicare HMO | Attending: Neurosurgery | Admitting: Physical Therapy

## 2020-02-06 ENCOUNTER — Other Ambulatory Visit: Payer: Self-pay

## 2020-02-06 DIAGNOSIS — M25662 Stiffness of left knee, not elsewhere classified: Secondary | ICD-10-CM | POA: Insufficient documentation

## 2020-02-06 DIAGNOSIS — R29898 Other symptoms and signs involving the musculoskeletal system: Secondary | ICD-10-CM | POA: Diagnosis not present

## 2020-02-06 DIAGNOSIS — M6281 Muscle weakness (generalized): Secondary | ICD-10-CM | POA: Diagnosis not present

## 2020-02-06 DIAGNOSIS — M545 Low back pain, unspecified: Secondary | ICD-10-CM | POA: Diagnosis not present

## 2020-02-06 DIAGNOSIS — R2689 Other abnormalities of gait and mobility: Secondary | ICD-10-CM | POA: Diagnosis not present

## 2020-02-06 DIAGNOSIS — M25562 Pain in left knee: Secondary | ICD-10-CM | POA: Diagnosis not present

## 2020-02-06 NOTE — Patient Instructions (Signed)
Access Code: [email or web address] URL: [email or web address] Date: 02/06/2020 Prepared by: [facility] Jazzmin Newbold  Exercises Seated Long Arc Quad - 1 x daily - 7 x weekly - 1 sets - 10 reps - 5-10 second hold

## 2020-02-06 NOTE — Therapy (Signed)
[place], [place], [address] Phone: [phone]   Fax:  [phone]  Physical Therapy Evaluation  Patient Details  Name: Jennifer Blackburn MRN: [id number] Date of Birth: [birth year] Referring Provider (PT): Erline Levine MD   Encounter Date: 02/06/2020   PT End of Session - 02/06/20 1529    Visit Number 1    Number of Visits 12    Date for PT Re-Evaluation 03/19/20    Authorization Type [employer]    Authorization Time Period 12 visits requested 02/06/20-03/19/20    Authorization - Visit Number 1    Authorization - Number of Visits 12    Progress Note Due on Visit 10    PT Start Time 1449    PT Stop Time 1525    PT Time Calculation (min) 36 min    Activity Tolerance Patient tolerated treatment well    Behavior During Therapy [facility] for tasks assessed/performed           Past Medical History:  Diagnosis Date  . Anxiety   . Arthritis   . CAD (coronary artery disease)   . Depression   . Diabetes mellitus type II    without complication  . DJD (degenerative joint disease) of lumbar spine   . Hypercholesteremia   . Hyperlipidemia   . Hypertension    benign   . Hypothyroidism   . Thyroid cancer ([place]) 2001    Past Surgical History:  Procedure Laterality Date  . ABDOMINAL HYSTERECTOMY    . CARDIAC CATHETERIZATION    . CATARACT EXTRACTION W/PHACO Right 07/17/2012   Procedure: CATARACT EXTRACTION PHACO AND INTRAOCULAR LENS PLACEMENT (IOC);  Surgeon: Tonny Branch, MD;  Location: [facility] OR[facility];  Service: Ophthalmology;  Laterality: Right;  CDE:25.51  . CATARACT EXTRACTION W/PHACO Left 09/17/2016   Procedure: CATARACT EXTRACTION PHACO AND INTRAOCULAR LENS PLACEMENT LEFT EYE;  Surgeon: Tonny Branch, MD;  Location: [facility] ORS;  Service: Ophthalmology;  Laterality: Left;  CDE: 19.23  . COLONOSCOPY    . COLONOSCOPY N/A 01/15/2014   Procedure: COLONOSCOPY;  Surgeon: Daneil Dolin, MD;  Location: [facility] SUITE;  Service:  Endoscopy;  Laterality: N/A;  9:00 AM  . COLONOSCOPY WITH PROPOFOL N/A 01/25/2019   Procedure: COLONOSCOPY WITH PROPOFOL;  Surgeon: Daneil Dolin, MD; diverticulosis in the entire colon, otherwise normal exam.  No repeat colonoscopy due to age.  . DOPPLER ECHOCARDIOGRAPHY    . ESOPHAGOGASTRODUODENOSCOPY (EGD) WITH PROPOFOL N/A 01/25/2019   Procedure: ESOPHAGOGASTRODUODENOSCOPY (EGD) WITH PROPOFOL;  Surgeon: Daneil Dolin, MD; normal esophagus without dilation due to inability to pass dilator beyond the hypopharynx, 1 small nonbleeding gastric ulcer s/p biopsied, otherwise normal exam.  Pathology with ulcer with reactive changes, no H. pylori, metaplasia, dysplasia, or malignancy.  . ESOPHAGOGASTRODUODENOSCOPY (EGD) WITH PROPOFOL N/A 08/21/2019   Procedure: ESOPHAGOGASTRODUODENOSCOPY (EGD) WITH PROPOFOL;  Surgeon: Daneil Dolin, MD; Normal esophagus. Previously noted gastric ulcer completely healed. Normal examined duodenum.   . JOINT REPLACEMENT  07/01/2010   left hip  . KNEE SURGERY Right    arthroscopy  . left hip replaced  07/01/2010  . SPINE SURGERY  2006   cervical  . stress dipyridamole myocardial perfusion    . THYROIDECTOMY    . TONSILLECTOMY    . VESICOVAGINAL FISTULA CLOSURE W/ TAH      There were no vitals filed for this visit.    Subjective Assessment - 02/06/20 1450    Subjective Patient is a 74 y.o. female who presents  to physical therapy with c/o low back pain with pain radiating down her LLE. Her pain in her leg feels like a tooth ache. Symptoms at worst go into her L ankle. Patient states symptoms began over the summer in July when she was on her legs constantly. She has chronic low back pain with pain radiating down her L leg. She has had a left THA. She will try therapy then injection, then surgery. Symptoms are aggravated with cleaning, walking, standing. Symptoms decrease with sidelying with pillow under her leg, relaxing in recliner. Her main goal is to feel better.  Usually rest helps her symptoms in the past.    Limitations House hold activities;Walking;Standing;Lifting    How long can you walk comfortably? 5 minutes    Patient Stated Goals to feel better    Currently in Pain? Yes    Pain Score 5     Pain Location Back    Pain Orientation Lower    Pain Descriptors / Indicators Aching;Sharp;Dull    Pain Type Acute pain;Chronic pain              [facility] PT Assessment - 02/06/20 0001      Assessment   Medical Diagnosis Lumbar Radiculopathy    Referring Provider (PT) Erline Levine MD    Onset Date/Surgical Date 10/06/19    Next MD Visit 6 weeks    Prior Therapy R shoulder      Precautions   Precautions Fall      Restrictions   Weight Bearing Restrictions No      Balance Screen   Has the patient fallen in the past 6 months No    Has the patient had a decrease in activity level because of a fear of falling?  No    Is the patient reluctant to leave their home because of a fear of falling?  No      Prior Function   Level of Independence Independent    Vocation Retired      Charity fundraiser Status Within Functional Limits for tasks assessed      Observation/Other Assessments   Observations Ambulates with antalgic gait on LLE    Focus on Therapeutic Outcomes (FOTO)  complete next session      Sensation   Light Touch Appears Intact      ROM / Strength   AROM / PROM / Strength AROM;Strength      AROM   Overall AROM Comments pain in back with all AROM, no change in leg symptoms with AROM    AROM Assessment Site Lumbar    Lumbar Flexion 25% limited    Lumbar Extension 75% limited    Lumbar - Right Side Bend 50% limited    Lumbar - Left Side Bend 50% limited    Lumbar - Right Rotation 25% limited    Lumbar - Left Rotation 25% limited      Strength   Strength Assessment Site Hip;Knee;Ankle    Right/Left Hip Right;Left    Right Hip Flexion 4+/5    Left Hip Flexion 4/5    Right/Left Knee Right;Left    Right Knee  Flexion 4/5    Right Knee Extension 4+/5    Left Knee Flexion 4-/5    Left Knee Extension 4+/5    Right/Left Ankle Right;Left    Right Ankle Dorsiflexion 4+/5    Left Ankle Dorsiflexion 4+/5      Palpation   Palpation comment TTP lumbar paraspinals bilateral  Transfers   Five time sit to stand comments  15.32 seconds    Comments without use of hands, uses momentum      Ambulation/Gait   Ambulation/Gait Yes    Ambulation/Gait Assistance 7: Independent    Ambulation Distance (Feet) 300 Feet    Assistive device None    Gait Pattern Left flexed knee in stance;Antalgic    Ambulation Surface Level;Indoor    Gait velocity decreased    Gait Comments 2MWT, slow, labored                      Objective measurements completed on examination: See above findings.       [facility] Adult PT Treatment/Exercise - 02/06/20 0001      Exercises   Exercises Knee/Hip      Knee/Hip Exercises: Seated   Long Arc Quad 1 set;10 reps;Left    Long Arc Quad Limitations 10 second holds                  PT Education - 02/06/20 1449    Education Details Patient educated on exam findings, POC, scope of PT, initial HEP    Person(s) Educated Patient    Methods Explanation;Demonstration    Comprehension Verbalized understanding;Returned demonstration            PT Short Term Goals - 02/06/20 1539      PT SHORT TERM GOAL #1   Title Patient will be independent with HEP in order to improve functional outcomes.    Time 3    Period Weeks    Status New    Target Date 02/27/20      PT SHORT TERM GOAL #2   Title Patient will report at least 25% improvement in symptoms for improved quality of life.    Time 3    Period Weeks    Status New    Target Date 02/27/20             PT Long Term Goals - 02/06/20 1540      PT LONG TERM GOAL #1   Title Patient will report at least 75% improvement in symptoms for improved quality of life.    Time 6    Period Weeks    Status  New    Target Date 03/19/20      PT LONG TERM GOAL #2   Title Patient will be able to complete 5x STS in under 11.4 seconds in order to reduce the risk of falls.    Time 6    Period Weeks    Status New    Target Date 03/05/20      PT LONG TERM GOAL #3   Title Patient will be able to ambulate at least 350 feet in 2MWT in order to demonstrate improved gait speed for community ambulation.    Time 6    Period Weeks    Status New    Target Date 03/19/20                  Plan - 02/06/20 1534    Clinical Impression Statement Patient is a 74 y.o. female who presents to physical therapy with c/o low back pain with pain radiating down her LLE. She presents with pain limited deficits in lumbar and LE strength, ROM, endurance, postural impairments, spinal mobility, gait, and functional mobility with ADL. She is having to modify and restrict ADL as indicated by  subjective information and objective measures which is affecting overall  participation. Patient will benefit from skilled physical therapy in order to improve function and reduce impairment.    Personal Factors and Comorbidities Age;Behavior Pattern;Comorbidity 1;Time since onset of injury/illness/exacerbation;Fitness;Past/Current Experience;Finances    Comorbidities chronic back pain    Examination-Activity Limitations Bed Mobility;Bend;Lift;Stand;Stairs;Squat;Locomotion Level;Transfers    Examination-Participation Restrictions Cleaning;Meal Prep;Church;Community Activity;Volunteer;Yard Work;Shop    Stability/Clinical Decision Making Evolving/Moderate complexity    Clinical Decision Making Moderate    Rehab Potential Fair    PT Frequency 2x / week    PT Duration 6 weeks    PT Treatment/Interventions ADLs/Self Care Home Management;Aquatic Therapy;Cryotherapy;Scientist, product/process development;Iontophoresis 4mg /ml Dexamethasone;Moist Heat;Traction;Gait training;Stair training;Functional mobility training;Therapeutic  activities;Therapeutic exercise;Balance training;Neuromuscular re-education;Patient/family education;Manual techniques;Compression bandaging;Passive range of motion;Scar mobilization;Dry needling;Energy conservation;Splinting;Spinal Manipulations;Joint Manipulations;Taping    PT Next Visit Plan begin core strengthening, hip strengthing, L knee strengthening. lumbar mobility exercises, gait training, balance training as able    PT Home Exercise Plan 11/17 LAQ    Consulted and Agree with Plan of Care Patient           Patient will benefit from skilled therapeutic intervention in order to improve the following deficits and impairments:  Abnormal gait, Decreased range of motion, Decreased endurance, Increased muscle spasms, Decreased activity tolerance, Pain, Decreased balance, Improper body mechanics, Decreased mobility, Decreased strength, Postural dysfunction  Visit Diagnosis: Low back pain, unspecified back pain laterality, unspecified chronicity, unspecified whether sciatica present  Left knee pain, unspecified chronicity  Muscle weakness (generalized)  Other abnormalities of gait and mobility  Other symptoms and signs involving the musculoskeletal system  Stiffness of left knee, not elsewhere classified     Problem List Patient Active Problem List   Diagnosis Date Noted  . Leg swelling 11/18/2019  . History of gastric ulcer 05/07/2019  . Muscle pain 02/07/2019  . Fatigue 02/07/2019  . Dysphagia 11/11/2018  . History of colonic polyps 11/11/2018  . Obesity (BMI 30.0-34.9) 11/05/2018  . Cervical spondylosis with radiculopathy 06/20/2017  . Hypertension   . Hyperlipidemia   . Depression   . CAD (coronary artery disease)   . Anxiety   . Osteoarthritis 02/17/2017  . Family history of coronary artery disease in father 02/17/2017  . Lumbar spondylosis with myelopathy 07/18/2016  . Laryngopharyngeal reflux (LPR) 06/24/2016  . Type 2 diabetes mellitus with vascular disease  ([place]) 01/13/2015  . Insomnia due to stress 08/20/2014  . GERD (gastroesophageal reflux disease) 04/11/2014  . At high risk for falls 12/31/2013  . Postsurgical hypothyroidism 01/05/2013  . Snoring 01/05/2013  . Back pain with radiculopathy 11/28/2012  . Anxiety and depression 10/16/2011  . Anemia 03/30/2011  . Posterior right knee pain 01/24/2011  . Vitamin D deficiency 10/14/2010  . Bilateral carotid bruits 10/13/2010  . Allergic rhinitis 11/23/2007  . CARPAL TUNNEL SYNDROME, BILATERAL 04/14/2007  . Coronary atherosclerosis 04/14/2007  . Headache 04/14/2007  . Thyroid cancer ([place]) 03/23/1999    3:41 PM, 02/06/20 Mearl Latin PT, DPT Physical Therapist at [place], [place], [address] Phone: [phone]   Fax:  [phone]  Name: Jennifer Blackburn MRN: [id number] Date of Birth: August 27, 1945

## 2020-02-07 NOTE — Telephone Encounter (Signed)
Patient said she talked to pharmacy earlier this week and Rx is still the same - can we try and resend it one more time for the 30 days rather than 38?

## 2020-02-08 ENCOUNTER — Telehealth: Payer: Self-pay | Admitting: Internal Medicine

## 2020-02-08 DIAGNOSIS — E1159 Type 2 diabetes mellitus with other circulatory complications: Secondary | ICD-10-CM

## 2020-02-08 NOTE — Telephone Encounter (Signed)
New message    Patient was told to call back on yesterday to speak with the nurse - insulin glargine, 1 Unit Dial, (TOUJEO SOLOSTAR) 300 UNIT/ML Solostar Pen

## 2020-02-08 NOTE — Telephone Encounter (Signed)
Yes, let's please adjust the daily dose and re-send it

## 2020-02-12 ENCOUNTER — Other Ambulatory Visit: Payer: Self-pay

## 2020-02-12 DIAGNOSIS — E1159 Type 2 diabetes mellitus with other circulatory complications: Secondary | ICD-10-CM

## 2020-02-12 MED ORDER — TOUJEO SOLOSTAR 300 UNIT/ML ~~LOC~~ SOPN: PEN_INJECTOR | SUBCUTANEOUS | 2 refills | Status: DC

## 2020-02-12 NOTE — Telephone Encounter (Signed)
RX resubmitted with okay from MD.

## 2020-02-19 ENCOUNTER — Encounter (HOSPITAL_COMMUNITY): Payer: Self-pay | Admitting: Physical Therapy

## 2020-02-19 ENCOUNTER — Other Ambulatory Visit: Payer: Self-pay

## 2020-02-19 ENCOUNTER — Ambulatory Visit (HOSPITAL_COMMUNITY): Payer: Medicare HMO | Admitting: Physical Therapy

## 2020-02-19 ENCOUNTER — Other Ambulatory Visit: Payer: Self-pay | Admitting: Family Medicine

## 2020-02-19 ENCOUNTER — Other Ambulatory Visit: Payer: Self-pay | Admitting: Cardiovascular Disease

## 2020-02-19 DIAGNOSIS — M25662 Stiffness of left knee, not elsewhere classified: Secondary | ICD-10-CM

## 2020-02-19 DIAGNOSIS — R2689 Other abnormalities of gait and mobility: Secondary | ICD-10-CM | POA: Diagnosis not present

## 2020-02-19 DIAGNOSIS — R29898 Other symptoms and signs involving the musculoskeletal system: Secondary | ICD-10-CM | POA: Diagnosis not present

## 2020-02-19 DIAGNOSIS — M545 Low back pain, unspecified: Secondary | ICD-10-CM | POA: Diagnosis not present

## 2020-02-19 DIAGNOSIS — M6281 Muscle weakness (generalized): Secondary | ICD-10-CM

## 2020-02-19 DIAGNOSIS — K219 Gastro-esophageal reflux disease without esophagitis: Secondary | ICD-10-CM

## 2020-02-19 DIAGNOSIS — M25562 Pain in left knee: Secondary | ICD-10-CM | POA: Diagnosis not present

## 2020-02-19 NOTE — Patient Instructions (Signed)
Access Code: [email or web address] URL: [email or web address] Date: 02/19/2020 Prepared by: Josue Hector  Exercises Supine Transversus Abdominis Bracing - Hands on Stomach - 2 x daily - 7 x weekly - 1 sets - 10 reps - 5 seconds hold Supine Bridge - 2 x daily - 7 x weekly - 1 sets - 10 reps - 5 seconds hold Supine Lower Trunk Rotation - 2 x daily - 7 x weekly - 1 sets - 5 reps - 10 seconds hold Hook Lying Single Knee to Chest Stretch with Towel - 2 x daily - 7 x weekly - 1 sets - 5 reps - 10 seconds hold Supine Heel Slide - 2 x daily - 7 x weekly - 1 sets - 10 reps

## 2020-02-19 NOTE — Therapy (Signed)
[place], [place], [address] Phone: [phone]   Fax:  [phone]  Physical Therapy Treatment  Patient Details  Name: Jennifer Blackburn MRN: [id number] Date of Birth: [birth year] Referring Provider (PT): Erline Levine MD   Encounter Date: 02/19/2020   PT End of Session - 02/19/20 1354    Visit Number 2    Number of Visits 12    Date for PT Re-Evaluation 03/19/20    Authorization Type [employer]    Authorization Time Period 02/06/20-03/14/20    Authorization - Visit Number 2    Authorization - Number of Visits 12    Progress Note Due on Visit 10    PT Start Time 1346    PT Stop Time 1427    PT Time Calculation (min) 41 min    Activity Tolerance Patient tolerated treatment well    Behavior During Therapy [facility] for tasks assessed/performed           Past Medical History:  Diagnosis Date  . Anxiety   . Arthritis   . CAD (coronary artery disease)   . Depression   . Diabetes mellitus type II    without complication  . DJD (degenerative joint disease) of lumbar spine   . Hypercholesteremia   . Hyperlipidemia   . Hypertension    benign   . Hypothyroidism   . Thyroid cancer ([place]) 2001    Past Surgical History:  Procedure Laterality Date  . ABDOMINAL HYSTERECTOMY    . CARDIAC CATHETERIZATION    . CATARACT EXTRACTION W/PHACO Right 07/17/2012   Procedure: CATARACT EXTRACTION PHACO AND INTRAOCULAR LENS PLACEMENT (IOC);  Surgeon: Tonny Branch, MD;  Location: [facility];  Service: Ophthalmology;  Laterality: Right;  CDE:25.51  . CATARACT EXTRACTION W/PHACO Left 09/17/2016   Procedure: CATARACT EXTRACTION PHACO AND INTRAOCULAR LENS PLACEMENT LEFT EYE;  Surgeon: Tonny Branch, MD;  Location: [facility];  Service: Ophthalmology;  Laterality: Left;  CDE: 19.23  . COLONOSCOPY    . COLONOSCOPY N/A 01/15/2014   Procedure: COLONOSCOPY;  Surgeon: Daneil Dolin, MD;  Location[facility];  Service: Endoscopy;  Laterality:  N/A;  9:00 AM  . COLONOSCOPY WITH PROPOFOL N/A 01/25/2019   Procedure: COLONOSCOPY WITH PROPOFOL;  Surgeon: Daneil Dolin, MD; diverticulosis in the entire colon, otherwise normal exam.  No repeat colonoscopy due to age.  . DOPPLER ECHOCARDIOGRAPHY    . ESOPHAGOGASTRODUODENOSCOPY (EGD) WITH PROPOFOL N/A 01/25/2019   Procedure: ESOPHAGOGASTRODUODENOSCOPY (EGD) WITH PROPOFOL;  Surgeon: Daneil Dolin, MD; normal esophagus without dilation due to inability to pass dilator beyond the hypopharynx, 1 small nonbleeding gastric ulcer s/p biopsied, otherwise normal exam.  Pathology with ulcer with reactive changes, no H. pylori, metaplasia, dysplasia, or malignancy.  . ESOPHAGOGASTRODUODENOSCOPY (EGD) WITH PROPOFOL N/A 08/21/2019   Procedure: ESOPHAGOGASTRODUODENOSCOPY (EGD) WITH PROPOFOL;  Surgeon: Daneil Dolin, MD; Normal esophagus. Previously noted gastric ulcer completely healed. Normal examined duodenum.   . JOINT REPLACEMENT  07/01/2010   left hip  . KNEE SURGERY Right    arthroscopy  . left hip replaced  07/01/2010  . SPINE SURGERY  2006   cervical  . stress dipyridamole myocardial perfusion    . THYROIDECTOMY    . TONSILLECTOMY    . VESICOVAGINAL FISTULA CLOSURE W/ TAH      There were no vitals filed for this visit.   Subjective Assessment - 02/19/20 1353    Subjective Patient reports compliance with HEP. Patient says her back hurts today but  not bad. Says doing chores outside and general movement increases pain.    Limitations House hold activities;Walking;Standing;Lifting    How long can you walk comfortably? 5 minutes    Patient Stated Goals to feel better    Currently in Pain? Yes    Pain Score 4     Pain Location Back    Pain Orientation Lower;Posterior    Pain Descriptors / Indicators Aching    Pain Type Chronic pain    Pain Onset More than a month ago    Pain Frequency Constant                             [facility] Adult PT Treatment/Exercise - 02/19/20 0001       Exercises   Exercises Lumbar      Lumbar Exercises: Stretches   Single Knee to Chest Stretch Right;Left;5 reps;10 seconds    Lower Trunk Rotation 5 reps;10 seconds      Lumbar Exercises: Supine   Ab Set 10 reps;5 seconds    Heel Slides 10 reps    Bent Knee Raise 20 reps    Bridge 10 reps;5 seconds                    PT Short Term Goals - 02/06/20 1539      PT SHORT TERM GOAL #1   Title Patient will be independent with HEP in order to improve functional outcomes.    Time 3    Period Weeks    Status New    Target Date 02/27/20      PT SHORT TERM GOAL #2   Title Patient will report at least 25% improvement in symptoms for improved quality of life.    Time 3    Period Weeks    Status New    Target Date 02/27/20             PT Long Term Goals - 02/06/20 1540      PT LONG TERM GOAL #1   Title Patient will report at least 75% improvement in symptoms for improved quality of life.    Time 6    Period Weeks    Status New    Target Date 03/19/20      PT LONG TERM GOAL #2   Title Patient will be able to complete 5x STS in under 11.4 seconds in order to reduce the risk of falls.    Time 6    Period Weeks    Status New    Target Date 03/05/20      PT LONG TERM GOAL #3   Title Patient will be able to ambulate at least 350 feet in 2MWT in order to demonstrate improved gait speed for community ambulation.    Time 6    Period Weeks    Status New    Target Date 03/19/20                 Plan - 02/19/20 1423    Clinical Impression Statement Patient tolerated session well today. Reviewed therapy goals and HEP. Initiated ther ex. Ther ex focused on hip and core strengthening as well as gentle lumbar and knee mobility. Patient tolerated these well. Limited by noted flexibility restrictions which are likely contributing to pain and negatively impacting functional ability. Patient educated on proper form and function of all added exercise. Patient issued  updated HEP handout. Patient will continue to benefit from  skilled therapy services to progress hip and core strength to reduce pain and improve LOF with ADLs.    Personal Factors and Comorbidities Age;Behavior Pattern;Comorbidity 1;Time since onset of injury/illness/exacerbation;Fitness;Past/Current Experience;Finances    Comorbidities chronic back pain    Examination-Activity Limitations Bed Mobility;Bend;Lift;Stand;Stairs;Squat;Locomotion Level;Transfers    Examination-Participation Restrictions Cleaning;Meal Prep;Church;Community Activity;Volunteer;Yard Work;Shop    Stability/Clinical Decision Making Evolving/Moderate complexity    Rehab Potential Fair    PT Frequency 2x / week    PT Duration 6 weeks    PT Treatment/Interventions ADLs/Self Care Home Management;Aquatic Therapy;Cryotherapy;Scientist, product/process development;Iontophoresis 4mg /ml Dexamethasone;Moist Heat;Traction;Gait training;Stair training;Functional mobility training;Therapeutic activities;Therapeutic exercise;Balance training;Neuromuscular re-education;Patient/family education;Manual techniques;Compression bandaging;Passive range of motion;Scar mobilization;Dry needling;Energy conservation;Splinting;Spinal Manipulations;Joint Manipulations;Taping    PT Next Visit Plan Continue core strengthening, hip strengthing, L knee strengthening. lumbar mobility exercises, gait training, balance training as able    PT Home Exercise Plan 11/17 LAQ 11/30: ab set, bridge, LTR, heel slides, SKTC    Consulted and Agree with Plan of Care Patient           Patient will benefit from skilled therapeutic intervention in order to improve the following deficits and impairments:  Abnormal gait, Decreased range of motion, Decreased endurance, Increased muscle spasms, Decreased activity tolerance, Pain, Decreased balance, Improper body mechanics, Decreased mobility, Decreased strength, Postural dysfunction  Visit Diagnosis: Low back pain,  unspecified back pain laterality, unspecified chronicity, unspecified whether sciatica present  Left knee pain, unspecified chronicity  Muscle weakness (generalized)  Other abnormalities of gait and mobility  Other symptoms and signs involving the musculoskeletal system  Stiffness of left knee, not elsewhere classified     Problem List Patient Active Problem List   Diagnosis Date Noted  . Leg swelling 11/18/2019  . History of gastric ulcer 05/07/2019  . Muscle pain 02/07/2019  . Fatigue 02/07/2019  . Dysphagia 11/11/2018  . History of colonic polyps 11/11/2018  . Obesity (BMI 30.0-34.9) 11/05/2018  . Cervical spondylosis with radiculopathy 06/20/2017  . Hypertension   . Hyperlipidemia   . Depression   . CAD (coronary artery disease)   . Anxiety   . Osteoarthritis 02/17/2017  . Family history of coronary artery disease in father 02/17/2017  . Lumbar spondylosis with myelopathy 07/18/2016  . Laryngopharyngeal reflux (LPR) 06/24/2016  . Type 2 diabetes mellitus with vascular disease ([place]) 01/13/2015  . Insomnia due to stress 08/20/2014  . GERD (gastroesophageal reflux disease) 04/11/2014  . At high risk for falls 12/31/2013  . Postsurgical hypothyroidism 01/05/2013  . Snoring 01/05/2013  . Back pain with radiculopathy 11/28/2012  . Anxiety and depression 10/16/2011  . Anemia 03/30/2011  . Posterior right knee pain 01/24/2011  . Vitamin D deficiency 10/14/2010  . Bilateral carotid bruits 10/13/2010  . Allergic rhinitis 11/23/2007  . CARPAL TUNNEL SYNDROME, BILATERAL 04/14/2007  . Coronary atherosclerosis 04/14/2007  . Headache 04/14/2007  . Thyroid cancer ([place]) 03/23/1999   2:28 PM, 02/19/20 Josue Hector PT DPT  Physical Therapist with [facility]  [phone] [place], [place], [address] Phone: [phone]   Fax:  [phone]  Name: MUSKAAN SMET MRN:  [id number] Date of Birth: 10/02/1945

## 2020-02-21 ENCOUNTER — Ambulatory Visit (HOSPITAL_COMMUNITY): Payer: Medicare HMO | Admitting: Physical Therapy

## 2020-02-21 ENCOUNTER — Telehealth (HOSPITAL_COMMUNITY): Payer: Self-pay | Admitting: Physical Therapy

## 2020-02-21 NOTE — Telephone Encounter (Signed)
Pt called and left message to cancel appointment.  Therapist was unaware assuming she did not show so calle d and spoke to pt who states she had left a message on VM here at the clinic to cancel or try to reschedule for another date.  REminded her of next appt coming up Tuesday at [place], PTA/CLT [phone]

## 2020-02-22 ENCOUNTER — Ambulatory Visit (HOSPITAL_COMMUNITY): Payer: Medicare HMO | Attending: Neurosurgery

## 2020-02-22 ENCOUNTER — Other Ambulatory Visit: Payer: Self-pay

## 2020-02-22 ENCOUNTER — Encounter (HOSPITAL_COMMUNITY): Payer: Self-pay

## 2020-02-22 DIAGNOSIS — M25562 Pain in left knee: Secondary | ICD-10-CM | POA: Diagnosis not present

## 2020-02-22 DIAGNOSIS — R29898 Other symptoms and signs involving the musculoskeletal system: Secondary | ICD-10-CM | POA: Insufficient documentation

## 2020-02-22 DIAGNOSIS — M545 Low back pain, unspecified: Secondary | ICD-10-CM | POA: Diagnosis not present

## 2020-02-22 DIAGNOSIS — M25662 Stiffness of left knee, not elsewhere classified: Secondary | ICD-10-CM | POA: Diagnosis not present

## 2020-02-22 DIAGNOSIS — R2689 Other abnormalities of gait and mobility: Secondary | ICD-10-CM | POA: Insufficient documentation

## 2020-02-22 DIAGNOSIS — M6281 Muscle weakness (generalized): Secondary | ICD-10-CM | POA: Diagnosis not present

## 2020-02-22 NOTE — Patient Instructions (Signed)
On Elbows (Prone)    Rise up on elbows as high as possible, keeping hips on floor. Hold 2 minutes. Repeat 1-2 times per set. Do 1 sets per session. Do 4 sessions per week.  http://orth.exer.us/93   Copyright  VHI. All rights reserved.    Back Hyperextension: Using Arms    Lying face down with arms bent, inhale. Then while exhaling, straighten arms. Hold 10 seconds. Slowly return to starting position. Repeat 5 times per set. Do 1 sets per day.  Copyright  VHI. All rights reserved.

## 2020-02-22 NOTE — Therapy (Signed)
[facility], [place], [address] Phone: [phone]   Fax:  [phone]  Physical Therapy Treatment  Patient Details  Name: Jennifer Blackburn MRN: [id number] Date of Birth: 1945/05/12 Referring Provider (PT): Erline Levine MD   Encounter Date: 02/22/2020   PT End of Session - 02/22/20 1456    Visit Number 3    Number of Visits 12    Date for PT Re-Evaluation 03/19/20    Authorization Type [employer]    Authorization Time Period 02/06/20-03/14/20    Authorization - Visit Number 3    Authorization - Number of Visits 12    Progress Note Due on Visit 10    PT Start Time 2836    PT Stop Time 1532   4' on nustep, not included with charges   PT Time Calculation (min) 44 min    Activity Tolerance Patient tolerated treatment well    Behavior During Therapy [facility] for tasks assessed/performed           Past Medical History:  Diagnosis Date  . Anxiety   . Arthritis   . CAD (coronary artery disease)   . Depression   . Diabetes mellitus type II    without complication  . DJD (degenerative joint disease) of lumbar spine   . Hypercholesteremia   . Hyperlipidemia   . Hypertension    benign   . Hypothyroidism   . Thyroid cancer ([place]) 2001    Past Surgical History:  Procedure Laterality Date  . ABDOMINAL HYSTERECTOMY    . CARDIAC CATHETERIZATION    . CATARACT EXTRACTION W/PHACO Right 07/17/2012   Procedure: CATARACT EXTRACTION PHACO AND INTRAOCULAR LENS PLACEMENT (IOC);  Surgeon: Tonny Branch, MD;  Location: [facility];  Service: Ophthalmology;  Laterality: Right;  CDE:25.51  . CATARACT EXTRACTION W/PHACO Left 09/17/2016   Procedure: CATARACT EXTRACTION PHACO AND INTRAOCULAR LENS PLACEMENT LEFT EYE;  Surgeon: Tonny Branch, MD;  Location: [facility];  Service: Ophthalmology;  Laterality: Left;  CDE: 19.23  . COLONOSCOPY    . COLONOSCOPY N/A 01/15/2014   Procedure: COLONOSCOPY;  Surgeon: Daneil Dolin, MD;  Location: [facility];  Service: Endoscopy;  Laterality: N/A;  9:00 AM  . COLONOSCOPY WITH PROPOFOL N/A 01/25/2019   Procedure: COLONOSCOPY WITH PROPOFOL;  Surgeon: Daneil Dolin, MD; diverticulosis in the entire colon, otherwise normal exam.  No repeat colonoscopy due to age.  . DOPPLER ECHOCARDIOGRAPHY    . ESOPHAGOGASTRODUODENOSCOPY (EGD) WITH PROPOFOL N/A 01/25/2019   Procedure: ESOPHAGOGASTRODUODENOSCOPY (EGD) WITH PROPOFOL;  Surgeon: Daneil Dolin, MD; normal esophagus without dilation due to inability to pass dilator beyond the hypopharynx, 1 small nonbleeding gastric ulcer s/p biopsied, otherwise normal exam.  Pathology with ulcer with reactive changes, no H. pylori, metaplasia, dysplasia, or malignancy.  . ESOPHAGOGASTRODUODENOSCOPY (EGD) WITH PROPOFOL N/A 08/21/2019   Procedure: ESOPHAGOGASTRODUODENOSCOPY (EGD) WITH PROPOFOL;  Surgeon: Daneil Dolin, MD; Normal esophagus. Previously noted gastric ulcer completely healed. Normal examined duodenum.   . JOINT REPLACEMENT  07/01/2010   left hip  . KNEE SURGERY Right    arthroscopy  . left hip replaced  07/01/2010  . SPINE SURGERY  2006   cervical  . stress dipyridamole myocardial perfusion    . THYROIDECTOMY    . TONSILLECTOMY    . VESICOVAGINAL FISTULA CLOSURE W/ TAH      There were no vitals filed for this visit.   Subjective Assessment - 02/22/20 1451    Subjective Pt reports she feels  sore achey pain in back and throbbing pain down halfway anterior shin.  Reports complaince with current HEP daily.  Reports increased pain bending forward with chores around the house.  Feels stiff today    Patient Stated Goals to feel better    Currently in Pain? Yes    Pain Score 6     Pain Location Back    Pain Orientation Left    Pain Descriptors / Indicators Aching;Sore;Throbbing    Pain Type Chronic pain    Pain Radiating Towards anterior ending halfway down anterior shin    Pain Onset More than a month ago    Pain Frequency Constant    Aggravating  Factors  bending forward with house chores    Pain Relieving Factors tylenol                             [facility] Adult PT Treatment/Exercise - 02/22/20 0001      Exercises   Exercises Lumbar      Lumbar Exercises: Stretches   Double Knee to Chest Stretch 2 reps;30 seconds    Double Knee to Chest Stretch Limitations towel assistance    Lower Trunk Rotation 5 reps;10 seconds    Lower Trunk Rotation Limitations feet on green theraball    Prone on Elbows Stretch Limitations 2 minutes    Press Ups 5 reps;10 seconds      Lumbar Exercises: Aerobic   Nustep UE/LE L2 4' at EOS for mobility      Lumbar Exercises: Supine   Heel Slides 10 reps    Heel Slides Limitations feet on theraball    Bridge 5 reps    Bridge Limitations 2 sets      Lumbar Exercises: Prone   Other Prone Lumbar Exercises heel squeeze 5x 5"                  PT Education - 02/22/20 1529    Education Details Educated on spinal anatomy    Person(s) Educated Patient    Methods Explanation    Comprehension Verbalized understanding            PT Short Term Goals - 02/06/20 1539      PT SHORT TERM GOAL #1   Title Patient will be independent with HEP in order to improve functional outcomes.    Time 3    Period Weeks    Status New    Target Date 02/27/20      PT SHORT TERM GOAL #2   Title Patient will report at least 25% improvement in symptoms for improved quality of life.    Time 3    Period Weeks    Status New    Target Date 02/27/20             PT Long Term Goals - 02/06/20 1540      PT LONG TERM GOAL #1   Title Patient will report at least 75% improvement in symptoms for improved quality of life.    Time 6    Period Weeks    Status New    Target Date 03/19/20      PT LONG TERM GOAL #2   Title Patient will be able to complete 5x STS in under 11.4 seconds in order to reduce the risk of falls.    Time 6    Period Weeks    Status New    Target Date 03/05/20  PT  LONG TERM GOAL #3   Title Patient will be able to ambulate at least 350 feet in 2MWT in order to demonstrate improved gait speed for community ambulation.    Time 6    Period Weeks    Status New    Target Date 03/19/20                 Plan - 02/22/20 1458    Clinical Impression Statement Session focus on lumbar mobility to address stiffness for pain control.  Added theraball with LTR with reports of improved stretch with additional support.  Began prone exercises to improve lumbar extension, with reports of radicular symptoms and LBP pain reduced.  Added POE and pressups to HEP with printout given.    Personal Factors and Comorbidities Age;Behavior Pattern;Comorbidity 1;Time since onset of injury/illness/exacerbation;Fitness;Past/Current Experience;Finances    Comorbidities chronic back pain    Examination-Activity Limitations Bed Mobility;Bend;Lift;Stand;Stairs;Squat;Locomotion Level;Transfers    Examination-Participation Restrictions Cleaning;Meal Prep;Church;Community Activity;Volunteer;Yard Work;Shop    Stability/Clinical Decision Making Evolving/Moderate complexity    Clinical Decision Making Moderate    Rehab Potential Fair    PT Frequency 2x / week    PT Duration 6 weeks    PT Treatment/Interventions ADLs/Self Care Home Management;Aquatic Therapy;Cryotherapy;Scientist, product/process development;Iontophoresis 4mg /ml Dexamethasone;Moist Heat;Traction;Gait training;Stair training;Functional mobility training;Therapeutic activities;Therapeutic exercise;Balance training;Neuromuscular re-education;Patient/family education;Manual techniques;Compression bandaging;Passive range of motion;Scar mobilization;Dry needling;Energy conservation;Splinting;Spinal Manipulations;Joint Manipulations;Taping    PT Next Visit Plan Continue core strengthening, hip strengthing, L knee strengthening. lumbar mobility exercises, gait training, balance training as able    PT Home Exercise Plan 11/17 LAQ  11/30: ab set, bridge, LTR, heel slides, SKTC; 12/3: POE and pressup.           Patient will benefit from skilled therapeutic intervention in order to improve the following deficits and impairments:  Abnormal gait, Decreased range of motion, Decreased endurance, Increased muscle spasms, Decreased activity tolerance, Pain, Decreased balance, Improper body mechanics, Decreased mobility, Decreased strength, Postural dysfunction  Visit Diagnosis: Left knee pain, unspecified chronicity  Muscle weakness (generalized)  Other abnormalities of gait and mobility  Other symptoms and signs involving the musculoskeletal system  Stiffness of left knee, not elsewhere classified  Low back pain, unspecified back pain laterality, unspecified chronicity, unspecified whether sciatica present     Problem List Patient Active Problem List   Diagnosis Date Noted  . Leg swelling 11/18/2019  . History of gastric ulcer 05/07/2019  . Muscle pain 02/07/2019  . Fatigue 02/07/2019  . Dysphagia 11/11/2018  . History of colonic polyps 11/11/2018  . Obesity (BMI 30.0-34.9) 11/05/2018  . Cervical spondylosis with radiculopathy 06/20/2017  . Hypertension   . Hyperlipidemia   . Depression   . CAD (coronary artery disease)   . Anxiety   . Osteoarthritis 02/17/2017  . Family history of coronary artery disease in father 02/17/2017  . Lumbar spondylosis with myelopathy 07/18/2016  . Laryngopharyngeal reflux (LPR) 06/24/2016  . Type 2 diabetes mellitus with vascular disease ([place]) 01/13/2015  . Insomnia due to stress 08/20/2014  . GERD (gastroesophageal reflux disease) 04/11/2014  . At high risk for falls 12/31/2013  . Postsurgical hypothyroidism 01/05/2013  . Snoring 01/05/2013  . Back pain with radiculopathy 11/28/2012  . Anxiety and depression 10/16/2011  . Anemia 03/30/2011  . Posterior right knee pain 01/24/2011  . Vitamin D deficiency 10/14/2010  . Bilateral carotid bruits 10/13/2010  . Allergic  rhinitis 11/23/2007  . CARPAL TUNNEL SYNDROME, BILATERAL 04/14/2007  . Coronary atherosclerosis 04/14/2007  . Headache 04/14/2007  .  Thyroid cancer ([place]) 03/23/1999   Ihor Austin, LPTA/CLT; CBIS [phone]  Aldona Lento 02/22/2020, 3:30 PM  [place], [place], [address] Phone: [phone]   Fax:  [phone]  Name: Jennifer Blackburn MRN: [id number] Date of Birth: 10/05/1945

## 2020-02-26 ENCOUNTER — Other Ambulatory Visit (HOSPITAL_COMMUNITY): Payer: Self-pay | Admitting: Family Medicine

## 2020-02-26 ENCOUNTER — Ambulatory Visit (HOSPITAL_COMMUNITY): Payer: Medicare HMO | Admitting: Physical Therapy

## 2020-02-26 ENCOUNTER — Ambulatory Visit (INDEPENDENT_AMBULATORY_CARE_PROVIDER_SITE_OTHER): Payer: Medicare HMO | Admitting: Family Medicine

## 2020-02-26 ENCOUNTER — Encounter: Payer: Self-pay | Admitting: Family Medicine

## 2020-02-26 ENCOUNTER — Encounter (HOSPITAL_COMMUNITY): Payer: Self-pay | Admitting: Physical Therapy

## 2020-02-26 ENCOUNTER — Other Ambulatory Visit: Payer: Self-pay

## 2020-02-26 VITALS — BP 119/60 | HR 68 | Resp 16 | Ht 65.0 in | Wt 189.1 lb

## 2020-02-26 DIAGNOSIS — M5410 Radiculopathy, site unspecified: Secondary | ICD-10-CM | POA: Diagnosis not present

## 2020-02-26 DIAGNOSIS — M7989 Other specified soft tissue disorders: Secondary | ICD-10-CM | POA: Diagnosis not present

## 2020-02-26 DIAGNOSIS — M25562 Pain in left knee: Secondary | ICD-10-CM | POA: Diagnosis not present

## 2020-02-26 DIAGNOSIS — E7800 Pure hypercholesterolemia, unspecified: Secondary | ICD-10-CM | POA: Diagnosis not present

## 2020-02-26 DIAGNOSIS — I10 Essential (primary) hypertension: Secondary | ICD-10-CM | POA: Diagnosis not present

## 2020-02-26 DIAGNOSIS — F321 Major depressive disorder, single episode, moderate: Secondary | ICD-10-CM

## 2020-02-26 DIAGNOSIS — Z1231 Encounter for screening mammogram for malignant neoplasm of breast: Secondary | ICD-10-CM

## 2020-02-26 DIAGNOSIS — E1159 Type 2 diabetes mellitus with other circulatory complications: Secondary | ICD-10-CM

## 2020-02-26 DIAGNOSIS — E890 Postprocedural hypothyroidism: Secondary | ICD-10-CM

## 2020-02-26 DIAGNOSIS — M6281 Muscle weakness (generalized): Secondary | ICD-10-CM | POA: Diagnosis not present

## 2020-02-26 DIAGNOSIS — M545 Low back pain, unspecified: Secondary | ICD-10-CM

## 2020-02-26 DIAGNOSIS — M25662 Stiffness of left knee, not elsewhere classified: Secondary | ICD-10-CM | POA: Diagnosis not present

## 2020-02-26 DIAGNOSIS — R2689 Other abnormalities of gait and mobility: Secondary | ICD-10-CM | POA: Diagnosis not present

## 2020-02-26 DIAGNOSIS — R29898 Other symptoms and signs involving the musculoskeletal system: Secondary | ICD-10-CM | POA: Diagnosis not present

## 2020-02-26 NOTE — Therapy (Signed)
[place], [place], [address] Phone: [phone]   Fax:  [phone]  Physical Therapy Treatment  Patient Details  Name: Jennifer Blackburn MRN: [id number] Date of Birth: [birth year] Referring Provider (PT): Erline Levine MD   Encounter Date: 02/26/2020   PT End of Session - 02/26/20 1312    Visit Number 4    Number of Visits 12    Date for PT Re-Evaluation 03/19/20    Authorization Type [employer]    Authorization Time Period 02/06/20-03/14/20    Authorization - Visit Number 4    Authorization - Number of Visits 12    Progress Note Due on Visit 10    PT Start Time 1324    PT Stop Time 1345    PT Time Calculation (min) 40 min    Activity Tolerance Patient tolerated treatment well    Behavior During Therapy [facility] for tasks assessed/performed           Past Medical History:  Diagnosis Date  . Anxiety   . Arthritis   . CAD (coronary artery disease)   . Depression   . Diabetes mellitus type II    without complication  . DJD (degenerative joint disease) of lumbar spine   . Hypercholesteremia   . Hyperlipidemia   . Hypertension    benign   . Hypothyroidism   . Thyroid cancer ([place]) 2001    Past Surgical History:  Procedure Laterality Date  . ABDOMINAL HYSTERECTOMY    . CARDIAC CATHETERIZATION    . CATARACT EXTRACTION W/PHACO Right 07/17/2012   Procedure: CATARACT EXTRACTION PHACO AND INTRAOCULAR LENS PLACEMENT (IOC);  Surgeon: Tonny Branch, MD;  Location: [facility];  Service: Ophthalmology;  Laterality: Right;  CDE:25.51  . CATARACT EXTRACTION W/PHACO Left 09/17/2016   Procedure: CATARACT EXTRACTION PHACO AND INTRAOCULAR LENS PLACEMENT LEFT EYE;  Surgeon: Tonny Branch, MD;  Location: [facility];  Service: Ophthalmology;  Laterality: Left;  CDE: 19.23  . COLONOSCOPY    . COLONOSCOPY N/A 01/15/2014   Procedure: COLONOSCOPY;  Surgeon: Daneil Dolin, MD;  [facility];  Service: Endoscopy;  Laterality:  N/A;  9:00 AM  . COLONOSCOPY WITH PROPOFOL N/A 01/25/2019   Procedure: COLONOSCOPY WITH PROPOFOL;  Surgeon: Daneil Dolin, MD; diverticulosis in the entire colon, otherwise normal exam.  No repeat colonoscopy due to age.  . DOPPLER ECHOCARDIOGRAPHY    . ESOPHAGOGASTRODUODENOSCOPY (EGD) WITH PROPOFOL N/A 01/25/2019   Procedure: ESOPHAGOGASTRODUODENOSCOPY (EGD) WITH PROPOFOL;  Surgeon: Daneil Dolin, MD; normal esophagus without dilation due to inability to pass dilator beyond the hypopharynx, 1 small nonbleeding gastric ulcer s/p biopsied, otherwise normal exam.  Pathology with ulcer with reactive changes, no H. pylori, metaplasia, dysplasia, or malignancy.  . ESOPHAGOGASTRODUODENOSCOPY (EGD) WITH PROPOFOL N/A 08/21/2019   Procedure: ESOPHAGOGASTRODUODENOSCOPY (EGD) WITH PROPOFOL;  Surgeon: Daneil Dolin, MD; Normal esophagus. Previously noted gastric ulcer completely healed. Normal examined duodenum.   . JOINT REPLACEMENT  07/01/2010   left hip  . KNEE SURGERY Right    arthroscopy  . left hip replaced  07/01/2010  . SPINE SURGERY  2006   cervical  . stress dipyridamole myocardial perfusion    . THYROIDECTOMY    . TONSILLECTOMY    . VESICOVAGINAL FISTULA CLOSURE W/ TAH      There were no vitals filed for this visit.   Subjective Assessment - 02/26/20 1311    Subjective Patient says she feels the exercises are helping. Says she feels relaxed  afterward. Says her leg is hurting some, she normally takes a pain pill, has not done so yet today.    Patient Stated Goals to feel better    Currently in Pain? Yes    Pain Score 6     Pain Location Back    Pain Orientation Left    Pain Descriptors / Indicators Aching;Throbbing    Pain Type Chronic pain    Pain Onset More than a month ago    Pain Frequency Constant                             [facility] Adult PT Treatment/Exercise - 02/26/20 0001      Lumbar Exercises: Stretches   Single Knee to Chest Stretch Right;Left;5  reps;10 seconds    Double Knee to Chest Stretch 5 reps;10 seconds    Prone on Elbows Stretch Limitations 2 minutes    Press Ups 10 reps      Lumbar Exercises: Supine   Ab Set 10 reps;5 seconds    Bridge 10 reps;5 seconds      Knee/Hip Exercises: Standing   Stairs 2RT 4 inch step reciprocal, single rail up, double rails down, LT knee pain with descending                     PT Short Term Goals - 02/06/20 1539      PT SHORT TERM GOAL #1   Title Patient will be independent with HEP in order to improve functional outcomes.    Time 3    Period Weeks    Status New    Target Date 02/27/20      PT SHORT TERM GOAL #2   Title Patient will report at least 25% improvement in symptoms for improved quality of life.    Time 3    Period Weeks    Status New    Target Date 02/27/20             PT Long Term Goals - 02/06/20 1540      PT LONG TERM GOAL #1   Title Patient will report at least 75% improvement in symptoms for improved quality of life.    Time 6    Period Weeks    Status New    Target Date 03/19/20      PT LONG TERM GOAL #2   Title Patient will be able to complete 5x STS in under 11.4 seconds in order to reduce the risk of falls.    Time 6    Period Weeks    Status New    Target Date 03/05/20      PT LONG TERM GOAL #3   Title Patient will be able to ambulate at least 350 feet in 2MWT in order to demonstrate improved gait speed for community ambulation.    Time 6    Period Weeks    Status New    Target Date 03/19/20                 Plan - 02/26/20 1346    Clinical Impression Statement Initiated session with review of prone press ups as was documented at prior session. Patient required verbal cueing for form and appears to have been doing some modified version of prone press up. Assessed patient response to this. Patient reports increased LT leg pain, despite subjective reports at beginning of session that press ups had made her legs and back  feel  better last time. Then assessed response to flexion-based stretching. Patient did well with this. Added DKTC stretch and patient reported decreased back and leg pain to 4/10. Discussed these findings and rationale with patient. Instructed patient to continue with flexion-based exercise and hold on extension based (prone press ups) for time being. Patient will continue to benefit from skilled therapy services to progress core and LE strength to reduce pain and improve functional mobility.    Personal Factors and Comorbidities Age;Behavior Pattern;Comorbidity 1;Time since onset of injury/illness/exacerbation;Fitness;Past/Current Experience;Finances    Comorbidities chronic back pain    Examination-Activity Limitations Bed Mobility;Bend;Lift;Stand;Stairs;Squat;Locomotion Level;Transfers    Examination-Participation Restrictions Cleaning;Meal Prep;Church;Community Activity;Volunteer;Yard Work;Shop    Stability/Clinical Decision Making Evolving/Moderate complexity    Rehab Potential Fair    PT Frequency 2x / week    PT Duration 6 weeks    PT Treatment/Interventions ADLs/Self Care Home Management;Aquatic Therapy;Cryotherapy;Scientist, product/process development;Iontophoresis 4mg /ml Dexamethasone;Moist Heat;Traction;Gait training;Stair training;Functional mobility training;Therapeutic activities;Therapeutic exercise;Balance training;Neuromuscular re-education;Patient/family education;Manual techniques;Compression bandaging;Passive range of motion;Scar mobilization;Dry needling;Energy conservation;Splinting;Spinal Manipulations;Joint Manipulations;Taping    PT Next Visit Plan Continue core strengthening, hip strengthing, L knee strengthening. lumbar mobility exercises, gait training, balance training as able    PT Home Exercise Plan 11/17 LAQ 11/30: ab set, bridge, LTR, heel slides, SKTC; 12/3: POE and pressup. 02/26/20: DKTC, (hold PPU)    Consulted and Agree with Plan of Care Patient           Patient  will benefit from skilled therapeutic intervention in order to improve the following deficits and impairments:  Abnormal gait, Decreased range of motion, Decreased endurance, Increased muscle spasms, Decreased activity tolerance, Pain, Decreased balance, Improper body mechanics, Decreased mobility, Decreased strength, Postural dysfunction  Visit Diagnosis: Left knee pain, unspecified chronicity  Muscle weakness (generalized)  Other abnormalities of gait and mobility  Other symptoms and signs involving the musculoskeletal system  Stiffness of left knee, not elsewhere classified  Low back pain, unspecified back pain laterality, unspecified chronicity, unspecified whether sciatica present     Problem List Patient Active Problem List   Diagnosis Date Noted  . Leg swelling 11/18/2019  . History of gastric ulcer 05/07/2019  . Muscle pain 02/07/2019  . Fatigue 02/07/2019  . Dysphagia 11/11/2018  . History of colonic polyps 11/11/2018  . Obesity (BMI 30.0-34.9) 11/05/2018  . Cervical spondylosis with radiculopathy 06/20/2017  . Hypertension   . Hyperlipidemia   . Depression   . CAD (coronary artery disease)   . Anxiety   . Osteoarthritis 02/17/2017  . Family history of coronary artery disease in father 02/17/2017  . Lumbar spondylosis with myelopathy 07/18/2016  . Laryngopharyngeal reflux (LPR) 06/24/2016  . Type 2 diabetes mellitus with vascular disease ([place]) 01/13/2015  . Insomnia due to stress 08/20/2014  . GERD (gastroesophageal reflux disease) 04/11/2014  . At high risk for falls 12/31/2013  . Postsurgical hypothyroidism 01/05/2013  . Snoring 01/05/2013  . Back pain with radiculopathy 11/28/2012  . Anxiety and depression 10/16/2011  . Anemia 03/30/2011  . Posterior right knee pain 01/24/2011  . Vitamin D deficiency 10/14/2010  . Bilateral carotid bruits 10/13/2010  . Allergic rhinitis 11/23/2007  . CARPAL TUNNEL SYNDROME, BILATERAL 04/14/2007  . Coronary  atherosclerosis 04/14/2007  . Headache 04/14/2007  . Thyroid cancer ([place]) 03/23/1999   1:49 PM, 02/26/20 Josue Hector PT DPT  Physical Therapist with [facility]  [phone] [place], [place], [address] Phone: [phone]  Fax:  [phone]  Name: Jennifer Blackburn MRN: [id number] Date of Birth: [birth year]

## 2020-02-26 NOTE — Patient Instructions (Addendum)
F/U end April, call if you need me sooner  Continue lasix, potassium and allergy medication  Call for referral for left knee if needed  Call for referral for therapy ( psycho ) if needed   It is important that you exercise regularly at least 30 minutes 5 times a week. If you develop chest pain, have severe difficulty breathing, or feel very tired, stop exercising immediately and seek medical attention  Think about what you will eat, plan ahead. Choose " clean, green, fresh or frozen" over canned, processed or packaged foods which are more sugary, salty and fatty. 70 to 75% of food eaten should be vegetables and fruit. Three meals at set times with snacks allowed between meals, but they must be fruit or vegetables. Aim to eat over a 12 hour period , example 7 am to 7 pm, and STOP after  your last meal of the day. Drink water,generally about 64 ounces per day, no other drink is as healthy. Fruit juice is best enjoyed in a healthy way, by EATING the fruit.   Thanks for choosing [facility], we consider it a privelige to serve you.

## 2020-02-28 ENCOUNTER — Encounter (HOSPITAL_COMMUNITY): Payer: Self-pay | Admitting: Physical Therapy

## 2020-02-28 ENCOUNTER — Other Ambulatory Visit: Payer: Self-pay

## 2020-02-28 ENCOUNTER — Ambulatory Visit (HOSPITAL_COMMUNITY): Payer: Medicare HMO | Admitting: Physical Therapy

## 2020-02-28 DIAGNOSIS — M6281 Muscle weakness (generalized): Secondary | ICD-10-CM | POA: Diagnosis not present

## 2020-02-28 DIAGNOSIS — M25562 Pain in left knee: Secondary | ICD-10-CM

## 2020-02-28 DIAGNOSIS — M25662 Stiffness of left knee, not elsewhere classified: Secondary | ICD-10-CM

## 2020-02-28 DIAGNOSIS — R2689 Other abnormalities of gait and mobility: Secondary | ICD-10-CM

## 2020-02-28 DIAGNOSIS — R29898 Other symptoms and signs involving the musculoskeletal system: Secondary | ICD-10-CM

## 2020-02-28 DIAGNOSIS — M545 Low back pain, unspecified: Secondary | ICD-10-CM | POA: Diagnosis not present

## 2020-02-28 NOTE — Therapy (Signed)
[place], [place], [address] Phone: [phone]   Fax:  [phone]  Physical Therapy Treatment  Patient Details  Name: Jennifer Blackburn MRN: [id number] Date of Birth: 14-Aug-1945 Referring Provider (PT): Erline Levine MD   Encounter Date: 02/28/2020   PT End of Session - 02/28/20 1404    Visit Number 5    Number of Visits 12    Date for PT Re-Evaluation 03/19/20    Authorization Type [employer]    Authorization Time Period 02/06/20-03/14/20    Authorization - Visit Number 5    Authorization - Number of Visits 12    Progress Note Due on Visit 10    PT Start Time 0254    PT Stop Time 1445    PT Time Calculation (min) 41 min    Activity Tolerance Patient tolerated treatment well    Behavior During Therapy [facility] for tasks assessed/performed           Past Medical History:  Diagnosis Date  . Anxiety   . Arthritis   . CAD (coronary artery disease)   . Depression   . Diabetes mellitus type II    without complication  . DJD (degenerative joint disease) of lumbar spine   . Hypercholesteremia   . Hyperlipidemia   . Hypertension    benign   . Hypothyroidism   . Thyroid cancer ([place]) 2001    Past Surgical History:  Procedure Laterality Date  . ABDOMINAL HYSTERECTOMY    . CARDIAC CATHETERIZATION    . CATARACT EXTRACTION W/PHACO Right 07/17/2012   Procedure: CATARACT EXTRACTION PHACO AND INTRAOCULAR LENS PLACEMENT (IOC);  Surgeon: Tonny Branch, MD;  Location: [facility] ORS;  Service: Ophthalmology;  Laterality: Right;  CDE:25.51  . CATARACT EXTRACTION W/PHACO Left 09/17/2016   Procedure: CATARACT EXTRACTION PHACO AND INTRAOCULAR LENS PLACEMENT LEFT EYE;  Surgeon: Tonny Branch, MD;  Location: [facility];  Service: Ophthalmology;  Laterality: Left;  CDE: 19.23  . COLONOSCOPY    . COLONOSCOPY N/A 01/15/2014   Procedure: COLONOSCOPY;  Surgeon: Daneil Dolin, MD;  Location: [facility] SUITE;  Service: Endoscopy;  Laterality:  N/A;  9:00 AM  . COLONOSCOPY WITH PROPOFOL N/A 01/25/2019   Procedure: COLONOSCOPY WITH PROPOFOL;  Surgeon: Daneil Dolin, MD; diverticulosis in the entire colon, otherwise normal exam.  No repeat colonoscopy due to age.  . DOPPLER ECHOCARDIOGRAPHY    . ESOPHAGOGASTRODUODENOSCOPY (EGD) WITH PROPOFOL N/A 01/25/2019   Procedure: ESOPHAGOGASTRODUODENOSCOPY (EGD) WITH PROPOFOL;  Surgeon: Daneil Dolin, MD; normal esophagus without dilation due to inability to pass dilator beyond the hypopharynx, 1 small nonbleeding gastric ulcer s/p biopsied, otherwise normal exam.  Pathology with ulcer with reactive changes, no H. pylori, metaplasia, dysplasia, or malignancy.  . ESOPHAGOGASTRODUODENOSCOPY (EGD) WITH PROPOFOL N/A 08/21/2019   Procedure: ESOPHAGOGASTRODUODENOSCOPY (EGD) WITH PROPOFOL;  Surgeon: Daneil Dolin, MD; Normal esophagus. Previously noted gastric ulcer completely healed. Normal examined duodenum.   . JOINT REPLACEMENT  07/01/2010   left hip  . KNEE SURGERY Right    arthroscopy  . left hip replaced  07/01/2010  . SPINE SURGERY  2006   cervical  . stress dipyridamole myocardial perfusion    . THYROIDECTOMY    . TONSILLECTOMY    . VESICOVAGINAL FISTULA CLOSURE W/ TAH      There were no vitals filed for this visit.   Subjective Assessment - 02/28/20 1405    Subjective Patient states that she has been feeling better but she is still  having pain. Her back is not feeling better better. She is still taking pain meds every day.    Patient Stated Goals to feel better    Currently in Pain? Yes    Pain Score 3     Pain Location Back    Pain Orientation Left    Pain Descriptors / Indicators Aching    Pain Type Chronic pain    Pain Onset More than a month ago                             [facility] Adult PT Treatment/Exercise - 02/28/20 0001      Lumbar Exercises: Standing   Row 15 reps    Theraband Level (Row) Level 2 (Red)    Row Limitations 2 sets    Shoulder Extension  15 reps    Theraband Level (Shoulder Extension) Level 2 (Red)    Shoulder Extension Limitations 2 sets    Other Standing Lumbar Exercises palof press 2x10 bilateral red band      Knee/Hip Exercises: Standing   Forward Step Up 2 sets;10 reps;Hand Hold: 1;Step Height: 6"      Knee/Hip Exercises: Seated   Sit to Sand 2 sets;10 reps                  PT Education - 02/28/20 1437    Education Details HEP, exercise mechanics    Person(s) Educated Patient    Methods Explanation;Demonstration    Comprehension Verbalized understanding;Returned demonstration            PT Short Term Goals - 02/06/20 1539      PT SHORT TERM GOAL #1   Title Patient will be independent with HEP in order to improve functional outcomes.    Time 3    Period Weeks    Status New    Target Date 02/27/20      PT SHORT TERM GOAL #2   Title Patient will report at least 25% improvement in symptoms for improved quality of life.    Time 3    Period Weeks    Status New    Target Date 02/27/20             PT Long Term Goals - 02/06/20 1540      PT LONG TERM GOAL #1   Title Patient will report at least 75% improvement in symptoms for improved quality of life.    Time 6    Period Weeks    Status New    Target Date 03/19/20      PT LONG TERM GOAL #2   Title Patient will be able to complete 5x STS in under 11.4 seconds in order to reduce the risk of falls.    Time 6    Period Weeks    Status New    Target Date 03/05/20      PT LONG TERM GOAL #3   Title Patient will be able to ambulate at least 350 feet in 2MWT in order to demonstrate improved gait speed for community ambulation.    Time 6    Period Weeks    Status New    Target Date 03/19/20                 Plan - 02/28/20 1405    Clinical Impression Statement Patient stating increase in knee pain during and after sit to stand exercise but demonstrates good mechanics and motor control following  cueing for LE positioning. Patient  requires cueing initial cueing for TRA activation with postural strengthening and intermittent cueing for posture. Added step up exercises for quad strengthening secondary to continued knee pain. Patient completes 4 inch steps with ease and is able to complete on 6 inch step without c/o increased pain. Patient will continue to benefit from skilled physical therapy in order to reduce impairment and improve function.    Personal Factors and Comorbidities Age;Behavior Pattern;Comorbidity 1;Time since onset of injury/illness/exacerbation;Fitness;Past/Current Experience;Finances    Comorbidities chronic back pain    Examination-Activity Limitations Bed Mobility;Bend;Lift;Stand;Stairs;Squat;Locomotion Level;Transfers    Examination-Participation Restrictions Cleaning;Meal Prep;Church;Community Activity;Volunteer;Yard Work;Shop    Stability/Clinical Decision Making Evolving/Moderate complexity    Rehab Potential Fair    PT Frequency 2x / week    PT Duration 6 weeks    PT Treatment/Interventions ADLs/Self Care Home Management;Aquatic Therapy;Cryotherapy;Scientist, product/process development;Iontophoresis 4mg /ml Dexamethasone;Moist Heat;Traction;Gait training;Stair training;Functional mobility training;Therapeutic activities;Therapeutic exercise;Balance training;Neuromuscular re-education;Patient/family education;Manual techniques;Compression bandaging;Passive range of motion;Scar mobilization;Dry needling;Energy conservation;Splinting;Spinal Manipulations;Joint Manipulations;Taping    PT Next Visit Plan Continue core strengthening, hip strengthing, L knee strengthening. lumbar mobility exercises, gait training, balance training as able    PT Home Exercise Plan 11/17 LAQ 11/30: ab set, bridge, LTR, heel slides, SKTC; 12/3: POE and pressup. 02/26/20: DKTC, (hold PPU) 12/9 STS, step up    Consulted and Agree with Plan of Care Patient           Patient will benefit from skilled therapeutic intervention in  order to improve the following deficits and impairments:  Abnormal gait,Decreased range of motion,Decreased endurance,Increased muscle spasms,Decreased activity tolerance,Pain,Decreased balance,Improper body mechanics,Decreased mobility,Decreased strength,Postural dysfunction  Visit Diagnosis: Left knee pain, unspecified chronicity  Muscle weakness (generalized)  Other abnormalities of gait and mobility  Other symptoms and signs involving the musculoskeletal system  Stiffness of left knee, not elsewhere classified     Problem List Patient Active Problem List   Diagnosis Date Noted  . Leg swelling 11/18/2019  . History of gastric ulcer 05/07/2019  . Muscle pain 02/07/2019  . Fatigue 02/07/2019  . Dysphagia 11/11/2018  . History of colonic polyps 11/11/2018  . Obesity (BMI 30.0-34.9) 11/05/2018  . Cervical spondylosis with radiculopathy 06/20/2017  . Hypertension   . Hyperlipidemia   . Depression   . CAD (coronary artery disease)   . Anxiety   . Osteoarthritis 02/17/2017  . Family history of coronary artery disease in father 02/17/2017  . Lumbar spondylosis with myelopathy 07/18/2016  . Laryngopharyngeal reflux (LPR) 06/24/2016  . Type 2 diabetes mellitus with vascular disease ([place]) 01/13/2015  . Insomnia due to stress 08/20/2014  . GERD (gastroesophageal reflux disease) 04/11/2014  . At high risk for falls 12/31/2013  . Postsurgical hypothyroidism 01/05/2013  . Snoring 01/05/2013  . Back pain with radiculopathy 11/28/2012  . Anxiety and depression 10/16/2011  . Anemia 03/30/2011  . Posterior right knee pain 01/24/2011  . Vitamin D deficiency 10/14/2010  . Bilateral carotid bruits 10/13/2010  . Allergic rhinitis 11/23/2007  . CARPAL TUNNEL SYNDROME, BILATERAL 04/14/2007  . Coronary atherosclerosis 04/14/2007  . Headache 04/14/2007  . Thyroid cancer ([place]) 03/23/1999   2:46 PM, 02/28/20 Mearl Latin PT, DPT Physical Therapist at [place], [place], [address] Phone: [phone]   Fax:  [phone]  Name: JERUSHA REISING MRN: [id number] Date of Birth: [birth year]

## 2020-02-28 NOTE — Patient Instructions (Signed)
Access Code: [email or web address] URL: [email or web address] Date: 02/28/2020 Prepared by: Mitzi Hansen Melida Northington  Exercises Sit to Stand with Arms Crossed - 1 x daily - 7 x weekly - 2 sets - 10 reps Step Up - 1 x daily - 7 x weekly - 2 sets - 10 reps

## 2020-03-01 ENCOUNTER — Encounter: Payer: Self-pay | Admitting: Family Medicine

## 2020-03-01 NOTE — Assessment & Plan Note (Signed)
Currently in therapy and being managed by neurosurgery, still c/o significant pain

## 2020-03-01 NOTE — Assessment & Plan Note (Signed)
Controlled, no change in medication DASH diet and commitment to daily physical activity for a minimum of 30 minutes discussed and encouraged, as a part of hypertension management. The importance of attaining a healthy weight is also discussed.  BP/Weight 02/26/2020 01/04/2020 12/25/2019 12/20/2019 11/06/2019 1/95/9747 03/29/5499  Systolic BP 586 825 749 355 217 471 595  Diastolic BP 60 62 75 73 78 68 70  Wt. (Lbs) 189.12 188 187 189.2 192 185 187  BMI 31.47 31.28 31.12 31.48 31.95 30.79 31.12

## 2020-03-01 NOTE — Assessment & Plan Note (Signed)
Controlled, no change in medication  

## 2020-03-01 NOTE — Assessment & Plan Note (Signed)
uncontrolled and managed by [name] is reminded of the importance of commitment to daily physical activity for 30 minutes or more, as able and the need to limit carbohydrate intake to 30 to 60 grams per meal to help with blood sugar control.   The need to take medication as prescribed, test blood sugar as directed, and to call between visits if there is a concern that blood sugar is uncontrolled is also discussed.   Jennifer Blackburn is reminded of the importance of daily foot exam, annual eye examination, and good blood sugar, blood pressure and cholesterol control.  Diabetic Labs Latest Ref Rng & Units 01/04/2020 12/25/2019 09/04/2019 08/29/2019 08/15/2019  HbA1c 4.0 - 5.6 % 9.7(A) - - 8.4(A) -  Microalbumin mg/dL - - - - -  Micro/Creat Ratio 0.0 - 30.0 mg/g creat - - - - -  Chol <200 mg/dL - - 157 - -  HDL > OR = 50 mg/dL - - 43(L) - -  Calc LDL mg/dL (calc) - - 91 - -  Triglycerides <150 mg/dL - - 133 - -  Creatinine 0.60 - 0.93 mg/dL - 0.81 - - 0.79   BP/Weight 02/26/2020 01/04/2020 12/25/2019 12/20/2019 11/06/2019 9/75/8832 07/23/9824  Systolic BP 415 830 940 768 088 110 315  Diastolic BP 60 62 75 73 78 68 70  Wt. (Lbs) 189.12 188 187 189.2 192 185 187  BMI 31.47 31.28 31.12 31.48 31.95 30.79 31.12   Foot/eye exam completion dates Latest Ref Rng & Units 10/26/2019 08/29/2019  Eye Exam No Retinopathy Retinopathy(A) -  Foot exam Order - - -  Foot Form Completion - - Done

## 2020-03-01 NOTE — Assessment & Plan Note (Signed)
Managed by Endo and is controlled.

## 2020-03-01 NOTE — Assessment & Plan Note (Signed)
Controlled with diuretic she is to continue the same.

## 2020-03-01 NOTE — Progress Notes (Signed)
Jennifer Blackburn     MRN: [id number]      DOB: 1945/10/03   HPI Jennifer Blackburn is here for follow up and re-evaluation of chronic medical conditions, medication management and review of any available recent lab and radiology data.  Preventive health is updated, specifically  Cancer screening and Immunization.   Currently receiving PT as directed by neurosurgery for back and lower extremity pin, reports little improvementThe PT denies any adverse reactions to current medications since the last visit.  Has questions re continuing diuretic and potassium Blood sugar remains uncontrolled, states not motivated to exercise but is going to work on this  ROS Denies recent fever or chills. Denies sinus pressure, nasal congestion, ear pain or sore throat. Denies chest congestion, productive cough or wheezing. Denies chest pains, palpitations and leg swelling Denies abdominal pain, nausea, vomiting,diarrhea or constipation.   Denies dysuria, frequency, hesitancy or incontinence.  Denies headaches, seizures, numbness, or tingling. Denies uncontrolled depression, anxiety or insomnia. Denies skin break down or rash.   PE  BP 119/60   Pulse 68   Resp 16   Ht 5\' 5"  (1.651 m)   Wt 189 lb 1.9 oz (85.8 kg)   LMP 11/11/2016   SpO2 95%   BMI 31.47 kg/m   Patient alert and oriented and in no cardiopulmonary distress.  HEENT: No facial asymmetry, EOMI,     Neck supple .  Chest: Clear to auscultation bilaterally.  CVS: S1, S2 no murmurs, no S3.Regular rate.  ABD: Soft non tender.   Ext: No edema  MS: decreased  ROM spine, shoulders, hips and knees.  Skin: Intact, no ulcerations or rash noted.  Psych: Good eye contact, normal affect. Memory intact not anxious or depressed appearing.  CNS: CN 2-12 intact, power,  normal throughout.no focal deficits noted.   Assessment & Plan  Back pain with radiculopathy Currently in therapy and being managed by neurosurgery, still c/o significant  pain  Type 2 diabetes mellitus with vascular disease ([place]) uncontrolled and managed by [name] is reminded of the importance of commitment to daily physical activity for 30 minutes or more, as able and the need to limit carbohydrate intake to 30 to 60 grams per meal to help with blood sugar control.   The need to take medication as prescribed, test blood sugar as directed, and to call between visits if there is a concern that blood sugar is uncontrolled is also discussed.   Jennifer Blackburn is reminded of the importance of daily foot exam, annual eye examination, and good blood sugar, blood pressure and cholesterol control.  Diabetic Labs Latest Ref Rng & Units 01/04/2020 12/25/2019 09/04/2019 08/29/2019 08/15/2019  HbA1c 4.0 - 5.6 % 9.7(A) - - 8.4(A) -  Microalbumin mg/dL - - - - -  Micro/Creat Ratio 0.0 - 30.0 mg/g creat - - - - -  Chol <200 mg/dL - - 157 - -  HDL > OR = 50 mg/dL - - 43(L) - -  Calc LDL mg/dL (calc) - - 91 - -  Triglycerides <150 mg/dL - - 133 - -  Creatinine 0.60 - 0.93 mg/dL - 0.81 - - 0.79   BP/Weight 02/26/2020 01/04/2020 12/25/2019 12/20/2019 11/06/2019 09/02/4313 4/0/0867  Systolic BP 619 509 326 712 458 099 833  Diastolic BP 60 62 75 73 78 68 70  Wt. (Lbs) 189.12 188 187 189.2 192 185 187  BMI 31.47 31.28 31.12 31.48 31.95 30.79 31.12   Foot/eye exam completion dates Latest Ref Rng &  Units 10/26/2019 08/29/2019  Eye Exam No Retinopathy Retinopathy(A) -  Foot exam Order - - -  Foot Form Completion - - Done        Leg swelling Controlled with diuretic she is to continue the same.  Postsurgical hypothyroidism Managed by Endo and is controlled.  Hypertension Controlled, no change in medication DASH diet and commitment to daily physical activity for a minimum of 30 minutes discussed and encouraged, as a part of hypertension management. The importance of attaining a healthy weight is also discussed.  BP/Weight 02/26/2020 01/04/2020 12/25/2019 12/20/2019 11/06/2019  0/11/3816 04/30/9369  Systolic BP 696 789 381 017 510 258 527  Diastolic BP 60 62 75 73 78 68 70  Wt. (Lbs) 189.12 188 187 189.2 192 185 187  BMI 31.47 31.28 31.12 31.48 31.95 30.79 31.12       Hyperlipidemia Hyperlipidemia:Low fat diet discussed and encouraged.   Lipid Panel  Lab Results  Component Value Date   CHOL 157 09/04/2019   HDL 43 (L) 09/04/2019   LDLCALC 91 09/04/2019   TRIG 133 09/04/2019   CHOLHDL 3.7 09/04/2019   Please increase physical activity to improve HDL otherwise controlled  '  Depression Controlled, no change in medication

## 2020-03-01 NOTE — Assessment & Plan Note (Signed)
Hyperlipidemia:Low fat diet discussed and encouraged.   Lipid Panel  Lab Results  Component Value Date   CHOL 157 09/04/2019   HDL 43 (L) 09/04/2019   LDLCALC 91 09/04/2019   TRIG 133 09/04/2019   CHOLHDL 3.7 09/04/2019   Please increase physical activity to improve HDL otherwise controlled  '

## 2020-03-04 ENCOUNTER — Other Ambulatory Visit: Payer: Self-pay

## 2020-03-04 ENCOUNTER — Ambulatory Visit (HOSPITAL_COMMUNITY): Payer: Medicare HMO | Admitting: Physical Therapy

## 2020-03-04 DIAGNOSIS — M25662 Stiffness of left knee, not elsewhere classified: Secondary | ICD-10-CM | POA: Diagnosis not present

## 2020-03-04 DIAGNOSIS — R29898 Other symptoms and signs involving the musculoskeletal system: Secondary | ICD-10-CM

## 2020-03-04 DIAGNOSIS — M25562 Pain in left knee: Secondary | ICD-10-CM | POA: Diagnosis not present

## 2020-03-04 DIAGNOSIS — R2689 Other abnormalities of gait and mobility: Secondary | ICD-10-CM

## 2020-03-04 DIAGNOSIS — M6281 Muscle weakness (generalized): Secondary | ICD-10-CM

## 2020-03-04 DIAGNOSIS — M545 Low back pain, unspecified: Secondary | ICD-10-CM | POA: Diagnosis not present

## 2020-03-04 NOTE — Therapy (Signed)
[place], [place], [address] Phone: [phone]   Fax:  [phone]  Physical Therapy Treatment  Patient Details  Name: Jennifer Blackburn MRN: [id number] Date of Birth: [birth year] Referring Provider (PT): Erline Levine MD   Encounter Date: 03/04/2020   PT End of Session - 03/04/20 1534    Visit Number 6    Number of Visits 12    Date for PT Re-Evaluation 03/19/20    Authorization Type [employer]    Authorization Time Period 02/06/20-03/14/20    Authorization - Visit Number 6    Authorization - Number of Visits 12    Progress Note Due on Visit 10    PT Start Time 1450    PT Stop Time 1530    PT Time Calculation (min) 40 min    Activity Tolerance Patient tolerated treatment well    Behavior During Therapy [facility] for tasks assessed/performed           Past Medical History:  Diagnosis Date  . Anxiety   . Arthritis   . CAD (coronary artery disease)   . Depression   . Diabetes mellitus type II    without complication  . DJD (degenerative joint disease) of lumbar spine   . Hypercholesteremia   . Hyperlipidemia   . Hypertension    benign   . Hypothyroidism   . Thyroid cancer ([place]) 2001    Past Surgical History:  Procedure Laterality Date  . ABDOMINAL HYSTERECTOMY    . CARDIAC CATHETERIZATION    . CATARACT EXTRACTION W/PHACO Right 07/17/2012   Procedure: CATARACT EXTRACTION PHACO AND INTRAOCULAR LENS PLACEMENT (IOC);  Surgeon: Tonny Branch, MD;  Location: [facility];  Service: Ophthalmology;  Laterality: Right;  CDE:25.51  . CATARACT EXTRACTION W/PHACO Left 09/17/2016   Procedure: CATARACT EXTRACTION PHACO AND INTRAOCULAR LENS PLACEMENT LEFT EYE;  Surgeon: Tonny Branch, MD;  Location: [facility];  Service: Ophthalmology;  Laterality: Left;  CDE: 19.23  . COLONOSCOPY    . COLONOSCOPY N/A 01/15/2014   Procedure: COLONOSCOPY;  Surgeon: Daneil Dolin, MD;  Location: [facility];  Service: Endoscopy;  Laterality:  N/A;  9:00 AM  . COLONOSCOPY WITH PROPOFOL N/A 01/25/2019   Procedure: COLONOSCOPY WITH PROPOFOL;  Surgeon: Daneil Dolin, MD; diverticulosis in the entire colon, otherwise normal exam.  No repeat colonoscopy due to age.  . DOPPLER ECHOCARDIOGRAPHY    . ESOPHAGOGASTRODUODENOSCOPY (EGD) WITH PROPOFOL N/A 01/25/2019   Procedure: ESOPHAGOGASTRODUODENOSCOPY (EGD) WITH PROPOFOL;  Surgeon: Daneil Dolin, MD; normal esophagus without dilation due to inability to pass dilator beyond the hypopharynx, 1 small nonbleeding gastric ulcer s/p biopsied, otherwise normal exam.  Pathology with ulcer with reactive changes, no H. pylori, metaplasia, dysplasia, or malignancy.  . ESOPHAGOGASTRODUODENOSCOPY (EGD) WITH PROPOFOL N/A 08/21/2019   Procedure: ESOPHAGOGASTRODUODENOSCOPY (EGD) WITH PROPOFOL;  Surgeon: Daneil Dolin, MD; Normal esophagus. Previously noted gastric ulcer completely healed. Normal examined duodenum.   . JOINT REPLACEMENT  07/01/2010   left hip  . KNEE SURGERY Right    arthroscopy  . left hip replaced  07/01/2010  . SPINE SURGERY  2006   cervical  . stress dipyridamole myocardial perfusion    . THYROIDECTOMY    . TONSILLECTOMY    . VESICOVAGINAL FISTULA CLOSURE W/ TAH      There were no vitals filed for this visit.   Subjective Assessment - 03/04/20 1533    Currently in Pain? Yes    Pain Score 5  Pain Location Knee    Pain Orientation Left    Pain Descriptors / Indicators Aching                             [facility] Adult PT Treatment/Exercise - 03/04/20 0001      Lumbar Exercises: Standing   Row 15 reps    Theraband Level (Row) Level 2 (Red)    Row Limitations 2 sets    Shoulder Extension 15 reps    Theraband Level (Shoulder Extension) Level 2 (Red)    Shoulder Extension Limitations 2 sets    Other Standing Lumbar Exercises pallof press 2x15 bilateral red band      Knee/Hip Exercises: Standing   Forward Lunges Both;2 sets;15 reps    Forward Lunges  Limitations onto 4" step no UE's    Lateral Step Up Both;15 reps;Hand Hold: 1;Step Height: 4"    Lateral Step Up Limitations eccentric lowering    Forward Step Up 15 reps;Hand Hold: 0;Step Height: 4"    Forward Step Up Limitations 4" step    Stairs 2RT 4 inch step reciprocal, up and down 1 HR, 7" steps 1 HR up and unable to do reciprocal down. single rail up      Knee/Hip Exercises: Seated   Sit to Sand 2 sets;10 reps                    PT Short Term Goals - 02/06/20 1539      PT SHORT TERM GOAL #1   Title Patient will be independent with HEP in order to improve functional outcomes.    Time 3    Period Weeks    Status New    Target Date 02/27/20      PT SHORT TERM GOAL #2   Title Patient will report at least 25% improvement in symptoms for improved quality of life.    Time 3    Period Weeks    Status New    Target Date 02/27/20             PT Long Term Goals - 02/06/20 1540      PT LONG TERM GOAL #1   Title Patient will report at least 75% improvement in symptoms for improved quality of life.    Time 6    Period Weeks    Status New    Target Date 03/19/20      PT LONG TERM GOAL #2   Title Patient will be able to complete 5x STS in under 11.4 seconds in order to reduce the risk of falls.    Time 6    Period Weeks    Status New    Target Date 03/05/20      PT LONG TERM GOAL #3   Title Patient will be able to ambulate at least 350 feet in 2MWT in order to demonstrate improved gait speed for community ambulation.    Time 6    Period Weeks    Status New    Target Date 03/19/20                 Plan - 03/04/20 1534    Clinical Impression Statement Pt states she is nearly able to lift her Rt LE independently into the bathtub as it is getting stronger.  Pt also able to descend 4" steps reciprocally using 1 HR ( had to use bil HR last visit).  Pt is unable to  descend reciprocally on 6" steps having to utilize a step to pattern.  Progressed this  session with added sets and/or reps and addition of forward lunges and lateral steps.    Personal Factors and Comorbidities Age;Behavior Pattern;Comorbidity 1;Time since onset of injury/illness/exacerbation;Fitness;Past/Current Experience;Finances    Comorbidities chronic back pain    Examination-Activity Limitations Bed Mobility;Bend;Lift;Stand;Stairs;Squat;Locomotion Level;Transfers    Examination-Participation Restrictions Cleaning;Meal Prep;Church;Community Activity;Volunteer;Yard Work;Shop    Stability/Clinical Decision Making Evolving/Moderate complexity    Rehab Potential Fair    PT Frequency 2x / week    PT Duration 6 weeks    PT Treatment/Interventions ADLs/Self Care Home Management;Aquatic Therapy;Cryotherapy;Scientist, product/process development;Iontophoresis 4mg /ml Dexamethasone;Moist Heat;Traction;Gait training;Stair training;Functional mobility training;Therapeutic activities;Therapeutic exercise;Balance training;Neuromuscular re-education;Patient/family education;Manual techniques;Compression bandaging;Passive range of motion;Scar mobilization;Dry needling;Energy conservation;Splinting;Spinal Manipulations;Joint Manipulations;Taping    PT Next Visit Plan Continue core strengthening, hip strengthing, L knee strengthening. lumbar mobility exercises, gait training, balance training as able    PT Home Exercise Plan 11/17 LAQ 11/30: ab set, bridge, LTR, heel slides, SKTC; 12/3: POE and pressup. 02/26/20: DKTC, (hold PPU) 12/9 STS, step up    Consulted and Agree with Plan of Care Patient           Patient will benefit from skilled therapeutic intervention in order to improve the following deficits and impairments:  Abnormal gait,Decreased range of motion,Decreased endurance,Increased muscle spasms,Decreased activity tolerance,Pain,Decreased balance,Improper body mechanics,Decreased mobility,Decreased strength,Postural dysfunction  Visit Diagnosis: Left knee pain, unspecified  chronicity  Muscle weakness (generalized)  Other abnormalities of gait and mobility  Other symptoms and signs involving the musculoskeletal system     Problem List Patient Active Problem List   Diagnosis Date Noted  . Leg swelling 11/18/2019  . History of gastric ulcer 05/07/2019  . Muscle pain 02/07/2019  . Fatigue 02/07/2019  . Dysphagia 11/11/2018  . History of colonic polyps 11/11/2018  . Obesity (BMI 30.0-34.9) 11/05/2018  . Cervical spondylosis with radiculopathy 06/20/2017  . Hypertension   . Hyperlipidemia   . Depression   . CAD (coronary artery disease)   . Anxiety   . Osteoarthritis 02/17/2017  . Family history of coronary artery disease in father 02/17/2017  . Lumbar spondylosis with myelopathy 07/18/2016  . Laryngopharyngeal reflux (LPR) 06/24/2016  . Type 2 diabetes mellitus with vascular disease ([place]) 01/13/2015  . Insomnia due to stress 08/20/2014  . GERD (gastroesophageal reflux disease) 04/11/2014  . At high risk for falls 12/31/2013  . Postsurgical hypothyroidism 01/05/2013  . Snoring 01/05/2013  . Back pain with radiculopathy 11/28/2012  . Anxiety and depression 10/16/2011  . Anemia 03/30/2011  . Posterior right knee pain 01/24/2011  . Vitamin D deficiency 10/14/2010  . Bilateral carotid bruits 10/13/2010  . Allergic rhinitis 11/23/2007  . CARPAL TUNNEL SYNDROME, BILATERAL 04/14/2007  . Coronary atherosclerosis 04/14/2007  . Headache 04/14/2007  . Thyroid cancer ([place]) 03/23/1999   Teena Irani, PTA/CLT [phone]  Teena Irani 03/04/2020, 3:36 PM  [place] [address] [place], [place], [address] Phone: [phone]   Fax:  [phone]  Name: BEANCA KIESTER MRN: [id number] Date of Birth: 1946/03/01

## 2020-03-06 ENCOUNTER — Encounter (HOSPITAL_COMMUNITY): Payer: Self-pay | Admitting: Physical Therapy

## 2020-03-06 ENCOUNTER — Other Ambulatory Visit: Payer: Self-pay

## 2020-03-06 ENCOUNTER — Ambulatory Visit (HOSPITAL_COMMUNITY): Payer: Medicare HMO | Admitting: Physical Therapy

## 2020-03-06 DIAGNOSIS — M6281 Muscle weakness (generalized): Secondary | ICD-10-CM | POA: Diagnosis not present

## 2020-03-06 DIAGNOSIS — R29898 Other symptoms and signs involving the musculoskeletal system: Secondary | ICD-10-CM

## 2020-03-06 DIAGNOSIS — R2689 Other abnormalities of gait and mobility: Secondary | ICD-10-CM

## 2020-03-06 DIAGNOSIS — M545 Low back pain, unspecified: Secondary | ICD-10-CM | POA: Diagnosis not present

## 2020-03-06 DIAGNOSIS — M25562 Pain in left knee: Secondary | ICD-10-CM

## 2020-03-06 DIAGNOSIS — M25662 Stiffness of left knee, not elsewhere classified: Secondary | ICD-10-CM | POA: Diagnosis not present

## 2020-03-06 NOTE — Therapy (Signed)
[place], [place], [address] Phone: [phone]   Fax:  [phone]  Physical Therapy Treatment  Patient Details  Name: Jennifer Blackburn MRN: [id number] Date of Birth: 17-Mar-1946 Referring Provider (PT): Erline Levine MD   Encounter Date: 03/06/2020   PT End of Session - 03/06/20 1450    Visit Number 7    Number of Visits 12    Date for PT Re-Evaluation 03/19/20    Authorization Type [employer]    Authorization Time Period 02/06/20-03/14/20    Authorization - Visit Number 7    Authorization - Number of Visits 12    Progress Note Due on Visit 10    PT Start Time 1450    PT Stop Time 1528    PT Time Calculation (min) 38 min    Activity Tolerance Patient tolerated treatment well    Behavior During Therapy [facility] for tasks assessed/performed           Past Medical History:  Diagnosis Date  . Anxiety   . Arthritis   . CAD (coronary artery disease)   . Depression   . Diabetes mellitus type II    without complication  . DJD (degenerative joint disease) of lumbar spine   . Hypercholesteremia   . Hyperlipidemia   . Hypertension    benign   . Hypothyroidism   . Thyroid cancer ([place]) 2001    Past Surgical History:  Procedure Laterality Date  . ABDOMINAL HYSTERECTOMY    . CARDIAC CATHETERIZATION    . CATARACT EXTRACTION W/PHACO Right 07/17/2012   Procedure: CATARACT EXTRACTION PHACO AND INTRAOCULAR LENS PLACEMENT (IOC);  Surgeon: Tonny Branch, MD;  Location: [facility];  Service: Ophthalmology;  Laterality: Right;  CDE:25.51  . CATARACT EXTRACTION W/PHACO Left 09/17/2016   Procedure: CATARACT EXTRACTION PHACO AND INTRAOCULAR LENS PLACEMENT LEFT EYE;  Surgeon: Tonny Branch, MD;  Location: [facility];  Service: Ophthalmology;  Laterality: Left;  CDE: 19.23  . COLONOSCOPY    . COLONOSCOPY N/A 01/15/2014   Procedure: COLONOSCOPY;  Surgeon: Daneil Dolin, MD;  [facility];  Service: Endoscopy;  Laterality:  N/A;  9:00 AM  . COLONOSCOPY WITH PROPOFOL N/A 01/25/2019   Procedure: COLONOSCOPY WITH PROPOFOL;  Surgeon: Daneil Dolin, MD; diverticulosis in the entire colon, otherwise normal exam.  No repeat colonoscopy due to age.  . DOPPLER ECHOCARDIOGRAPHY    . ESOPHAGOGASTRODUODENOSCOPY (EGD) WITH PROPOFOL N/A 01/25/2019   Procedure: ESOPHAGOGASTRODUODENOSCOPY (EGD) WITH PROPOFOL;  Surgeon: Daneil Dolin, MD; normal esophagus without dilation due to inability to pass dilator beyond the hypopharynx, 1 small nonbleeding gastric ulcer s/p biopsied, otherwise normal exam.  Pathology with ulcer with reactive changes, no H. pylori, metaplasia, dysplasia, or malignancy.  . ESOPHAGOGASTRODUODENOSCOPY (EGD) WITH PROPOFOL N/A 08/21/2019   Procedure: ESOPHAGOGASTRODUODENOSCOPY (EGD) WITH PROPOFOL;  Surgeon: Daneil Dolin, MD; Normal esophagus. Previously noted gastric ulcer completely healed. Normal examined duodenum.   . JOINT REPLACEMENT  07/01/2010   left hip  . KNEE SURGERY Right    arthroscopy  . left hip replaced  07/01/2010  . SPINE SURGERY  2006   cervical  . stress dipyridamole myocardial perfusion    . THYROIDECTOMY    . TONSILLECTOMY    . VESICOVAGINAL FISTULA CLOSURE W/ TAH      There were no vitals filed for this visit.   Subjective Assessment - 03/06/20 1451    Subjective Patient states her back is still hurting but it is better. Her  knees are doing alright.    Currently in Pain? Yes    Pain Score 4     Pain Location Knee    Pain Orientation Left    Pain Descriptors / Indicators Sore                             [facility] Adult PT Treatment/Exercise - 03/06/20 0001      Lumbar Exercises: Standing   Row 15 reps    Theraband Level (Row) Level 3 (Green)    Row Limitations 2 sets    Shoulder Extension 15 reps    Theraband Level (Shoulder Extension) Level 3 (Green)    Shoulder Extension Limitations 2 sets      Knee/Hip Exercises: Standing   Heel Raises Both;1 set;20  reps    Knee Flexion Both;2 sets;10 reps    Forward Lunges Both;2 sets;10 reps    Forward Lunges Limitations onto 4" step no UE's    Lateral Step Up Left;2 sets;10 reps;Hand Hold: 2;Step Height: 4"    Lateral Step Up Limitations eccentric control    Forward Step Up Left;15 reps;Hand Hold: 2;Step Height: 6";2 sets    Other Standing Knee Exercises lateral stepping 4x 15 feet      Knee/Hip Exercises: Seated   Sit to Sand 2 sets;10 reps                  PT Education - 03/06/20 1451    Education Details HEP, exercise mechanics    Person(s) Educated Patient    Methods Explanation;Demonstration    Comprehension Verbalized understanding;Returned demonstration            PT Short Term Goals - 02/06/20 1539      PT SHORT TERM GOAL #1   Title Patient will be independent with HEP in order to improve functional outcomes.    Time 3    Period Weeks    Status New    Target Date 02/27/20      PT SHORT TERM GOAL #2   Title Patient will report at least 25% improvement in symptoms for improved quality of life.    Time 3    Period Weeks    Status New    Target Date 02/27/20             PT Long Term Goals - 02/06/20 1540      PT LONG TERM GOAL #1   Title Patient will report at least 75% improvement in symptoms for improved quality of life.    Time 6    Period Weeks    Status New    Target Date 03/19/20      PT LONG TERM GOAL #2   Title Patient will be able to complete 5x STS in under 11.4 seconds in order to reduce the risk of falls.    Time 6    Period Weeks    Status New    Target Date 03/05/20      PT LONG TERM GOAL #3   Title Patient will be able to ambulate at least 350 feet in 2MWT in order to demonstrate improved gait speed for community ambulation.    Time 6    Period Weeks    Status New    Target Date 03/19/20                 Plan - 03/06/20 1451    Clinical Impression Statement Patient has c/o slight  knee pain near end range extension with  concentric phase of step up. Patient able to complete additional reps of step up exercises. Patient showing impaired eccentric strength and motor control requiring bilateral UE support. Patient requires cueing with posture with standing resisted postural exercises. Patient able to increase in resistance with postural strengthening. Patient has difficulty with lateral stepping secondary to impaired LE strength and has limited stance time on LLE. Patient showing improving LE strength with STS exercise with improving eccentric control. Patient fatigued at end of session. Patient will continue to benefit from skilled physical therapy in order to reduce impairment and improve function.    Personal Factors and Comorbidities Age;Behavior Pattern;Comorbidity 1;Time since onset of injury/illness/exacerbation;Fitness;Past/Current Experience;Finances    Comorbidities chronic back pain    Examination-Activity Limitations Bed Mobility;Bend;Lift;Stand;Stairs;Squat;Locomotion Level;Transfers    Examination-Participation Restrictions Cleaning;Meal Prep;Church;Community Activity;Volunteer;Yard Work;Shop    Stability/Clinical Decision Making Evolving/Moderate complexity    Rehab Potential Fair    PT Frequency 2x / week    PT Duration 6 weeks    PT Treatment/Interventions ADLs/Self Care Home Management;Aquatic Therapy;Cryotherapy;Scientist, product/process development;Iontophoresis 4mg /ml Dexamethasone;Moist Heat;Traction;Gait training;Stair training;Functional mobility training;Therapeutic activities;Therapeutic exercise;Balance training;Neuromuscular re-education;Patient/family education;Manual techniques;Compression bandaging;Passive range of motion;Scar mobilization;Dry needling;Energy conservation;Splinting;Spinal Manipulations;Joint Manipulations;Taping    PT Next Visit Plan Continue core strengthening, hip strengthing, L knee strengthening. lumbar mobility exercises, gait training, balance training as able    PT Home  Exercise Plan 11/17 LAQ 11/30: ab set, bridge, LTR, heel slides, SKTC; 12/3: POE and pressup. 02/26/20: DKTC, (hold PPU) 12/9 STS, step up    Consulted and Agree with Plan of Care Patient           Patient will benefit from skilled therapeutic intervention in order to improve the following deficits and impairments:  Abnormal gait,Decreased range of motion,Decreased endurance,Increased muscle spasms,Decreased activity tolerance,Pain,Decreased balance,Improper body mechanics,Decreased mobility,Decreased strength,Postural dysfunction  Visit Diagnosis: Left knee pain, unspecified chronicity  Muscle weakness (generalized)  Other abnormalities of gait and mobility  Other symptoms and signs involving the musculoskeletal system  Stiffness of left knee, not elsewhere classified     Problem List Patient Active Problem List   Diagnosis Date Noted  . Leg swelling 11/18/2019  . History of gastric ulcer 05/07/2019  . Muscle pain 02/07/2019  . Fatigue 02/07/2019  . Dysphagia 11/11/2018  . History of colonic polyps 11/11/2018  . Obesity (BMI 30.0-34.9) 11/05/2018  . Cervical spondylosis with radiculopathy 06/20/2017  . Hypertension   . Hyperlipidemia   . Depression   . CAD (coronary artery disease)   . Anxiety   . Osteoarthritis 02/17/2017  . Family history of coronary artery disease in father 02/17/2017  . Lumbar spondylosis with myelopathy 07/18/2016  . Laryngopharyngeal reflux (LPR) 06/24/2016  . Type 2 diabetes mellitus with vascular disease ([place]) 01/13/2015  . Insomnia due to stress 08/20/2014  . GERD (gastroesophageal reflux disease) 04/11/2014  . At high risk for falls 12/31/2013  . Postsurgical hypothyroidism 01/05/2013  . Snoring 01/05/2013  . Back pain with radiculopathy 11/28/2012  . Anxiety and depression 10/16/2011  . Anemia 03/30/2011  . Posterior right knee pain 01/24/2011  . Vitamin D deficiency 10/14/2010  . Bilateral carotid bruits 10/13/2010  . Allergic  rhinitis 11/23/2007  . CARPAL TUNNEL SYNDROME, BILATERAL 04/14/2007  . Coronary atherosclerosis 04/14/2007  . Headache 04/14/2007  . Thyroid cancer ([place]) 03/23/1999    3:29 PM, 03/06/20 Mearl Latin PT, DPT Physical Therapist at [place] [address]  [place], [place], [address] Phone: [phone]   Fax:  [phone]  Name: Jennifer Blackburn MRN: [id number] Date of Birth: 1946-02-09

## 2020-03-11 ENCOUNTER — Telehealth: Payer: Self-pay

## 2020-03-11 ENCOUNTER — Ambulatory Visit (HOSPITAL_COMMUNITY): Payer: Medicare HMO | Admitting: Physical Therapy

## 2020-03-11 ENCOUNTER — Encounter (HOSPITAL_COMMUNITY): Payer: Self-pay | Admitting: Physical Therapy

## 2020-03-11 ENCOUNTER — Other Ambulatory Visit: Payer: Self-pay

## 2020-03-11 DIAGNOSIS — M25562 Pain in left knee: Secondary | ICD-10-CM

## 2020-03-11 DIAGNOSIS — M6281 Muscle weakness (generalized): Secondary | ICD-10-CM

## 2020-03-11 DIAGNOSIS — M545 Low back pain, unspecified: Secondary | ICD-10-CM | POA: Diagnosis not present

## 2020-03-11 DIAGNOSIS — R2689 Other abnormalities of gait and mobility: Secondary | ICD-10-CM | POA: Diagnosis not present

## 2020-03-11 DIAGNOSIS — M25662 Stiffness of left knee, not elsewhere classified: Secondary | ICD-10-CM

## 2020-03-11 DIAGNOSIS — R29898 Other symptoms and signs involving the musculoskeletal system: Secondary | ICD-10-CM | POA: Diagnosis not present

## 2020-03-11 NOTE — Telephone Encounter (Signed)
Called and left message to call back.

## 2020-03-11 NOTE — Patient Instructions (Signed)
Access Code: [email or web address] URL: [email or web address] Date: 03/11/2020 Prepared by: Mitzi Hansen Chiniqua Kilcrease  Exercises Heel rises with counter support - 1 x daily - 7 x weekly - 20 reps Step Up - 1 x daily - 7 x weekly - 2 sets - 10 reps Side Stepping with Counter Support - 1 x daily - 7 x weekly - 10 reps Lateral Step Up - 1 x daily - 7 x weekly - 2 sets - 10 reps

## 2020-03-11 NOTE — Telephone Encounter (Signed)
Review of note shows  that   neurosurgery has her in therapy Pls verify it is the knee she is asking about and let me know which if any particular Ortho she wants to see

## 2020-03-11 NOTE — Therapy (Signed)
[place], [place], [address] Phone: [phone]   Fax:  [phone]  Physical Therapy Treatment/Discharge Summary  Patient Details  Name: Jennifer Blackburn MRN: [id number] Date of Birth: 1946/02/03 Referring Provider (PT): Erline Levine MD   Encounter Date: 03/11/2020   PHYSICAL THERAPY DISCHARGE SUMMARY  Visits from Start of Care: 8  Current functional level related to goals / functional outcomes: See below   Remaining deficits: See below   Education / Equipment: See below  Plan: Patient agrees to discharge.  Patient goals were partially met. Patient is being discharged due to meeting the stated rehab goals.  ?????         PT End of Session - 03/11/20 1452    Visit Number 8    Number of Visits 12    Date for PT Re-Evaluation 03/19/20    Authorization Type [employer]    Authorization Time Period 02/06/20-03/14/20    Authorization - Visit Number 8    Authorization - Number of Visits 12    Progress Note Due on Visit 10    PT Start Time 6720    PT Stop Time 1522    PT Time Calculation (min) 31 min    Activity Tolerance Patient tolerated treatment well    Behavior During Therapy WFL for tasks assessed/performed           Past Medical History:  Diagnosis Date   Anxiety    Arthritis    CAD (coronary artery disease)    Depression    Diabetes mellitus type II    without complication   DJD (degenerative joint disease) of lumbar spine    Hypercholesteremia    Hyperlipidemia    Hypertension    benign    Hypothyroidism    Thyroid cancer ([place]) 2001    Past Surgical History:  Procedure Laterality Date   ABDOMINAL HYSTERECTOMY     CARDIAC CATHETERIZATION     CATARACT EXTRACTION W/PHACO Right 07/17/2012   Procedure: CATARACT EXTRACTION PHACO AND INTRAOCULAR LENS PLACEMENT ([place]);  Surgeon: Tonny Branch, MD;  Location: [facility];  Service: Ophthalmology;  Laterality: Right;   CDE:25.51   CATARACT EXTRACTION W/PHACO Left 09/17/2016   Procedure: CATARACT EXTRACTION PHACO AND INTRAOCULAR LENS PLACEMENT LEFT EYE;  Surgeon: Tonny Branch, MD;  Location: [facility];  Service: Ophthalmology;  Laterality: Left;  CDE: 19.23   COLONOSCOPY     COLONOSCOPY N/A 01/15/2014   Procedure: COLONOSCOPY;  Surgeon: Daneil Dolin, MD;  Location: [facility];  Service: Endoscopy;  Laterality: N/A;  9:00 AM   COLONOSCOPY WITH PROPOFOL N/A 01/25/2019   Procedure: COLONOSCOPY WITH PROPOFOL;  Surgeon: Daneil Dolin, MD; diverticulosis in the entire colon, otherwise normal exam.  No repeat colonoscopy due to age.   DOPPLER ECHOCARDIOGRAPHY     ESOPHAGOGASTRODUODENOSCOPY (EGD) WITH PROPOFOL N/A 01/25/2019   Procedure: ESOPHAGOGASTRODUODENOSCOPY (EGD) WITH PROPOFOL;  Surgeon: Daneil Dolin, MD; normal esophagus without dilation due to inability to pass dilator beyond the hypopharynx, 1 small nonbleeding gastric ulcer s/p biopsied, otherwise normal exam.  Pathology with ulcer with reactive changes, no H. pylori, metaplasia, dysplasia, or malignancy.   ESOPHAGOGASTRODUODENOSCOPY (EGD) WITH PROPOFOL N/A 08/21/2019   Procedure: ESOPHAGOGASTRODUODENOSCOPY (EGD) WITH PROPOFOL;  Surgeon: Daneil Dolin, MD; Normal esophagus. Previously noted gastric ulcer completely healed. Normal examined duodenum.    JOINT REPLACEMENT  07/01/2010   left hip   KNEE SURGERY Right    arthroscopy   left hip replaced  07/01/2010  SPINE SURGERY  2006   cervical   stress dipyridamole myocardial perfusion     THYROIDECTOMY     TONSILLECTOMY     VESICOVAGINAL FISTULA CLOSURE W/ TAH      There were no vitals filed for this visit.   Subjective Assessment - 03/11/20 1453    Subjective Patient states she called her MD to get a scan of her knee because her leg was really sore over the weekend. She was more active over the weekend. Her back is still bothering her with completing chores.    Pain Score 4     Pain  Location Back    Pain Orientation Lower              [facility] PT Assessment - 03/11/20 0001      Assessment   Medical Diagnosis Lumbar Radiculopathy    Referring Provider (PT) Erline Levine MD    Onset Date/Surgical Date 10/06/19    Next MD Visit 6 weeks    Prior Therapy R shoulder      Precautions   Precautions Fall      Restrictions   Weight Bearing Restrictions No      Balance Screen   Has the patient fallen in the past 6 months No    Has the patient had a decrease in activity level because of a fear of falling?  No    Is the patient reluctant to leave their home because of a fear of falling?  No      Prior Function   Level of Independence Independent    Vocation Retired      Charity fundraiser Status Within Functional Limits for tasks assessed      Observation/Other Assessments   Observations Ambulates with antalgic gait on LLE    Focus on Therapeutic Outcomes (FOTO)  n/a      Sensation   Light Touch Appears Intact      AROM   Lumbar Flexion 0% limited    Lumbar Extension 75% limited    Lumbar - Right Side Bend 50% limited    Lumbar - Left Side Bend 25% limited    Lumbar - Right Rotation 25% limited    Lumbar - Left Rotation 25% limited      Strength   Right Hip Flexion 4+/5    Left Hip Flexion 4/5    Right Knee Flexion 4/5    Right Knee Extension 4+/5    Left Knee Flexion 4+/5    Left Knee Extension 4+/5    Right Ankle Dorsiflexion 4+/5    Left Ankle Dorsiflexion 4+/5      Transfers   Five time sit to stand comments  13.2 seconds    Comments without use of hands      Ambulation/Gait   Ambulation/Gait Yes    Ambulation/Gait Assistance 7: Independent    Ambulation Distance (Feet) 226 Feet    Assistive device None    Gait Pattern Left flexed knee in stance;Antalgic    Ambulation Surface Level;Indoor    Gait velocity decreased    Gait Comments 2MWT, had to stop at 1 min due to fatigue                                  PT Education - 03/11/20 1451    Education Details HEP, exercise mechanics, progress made, returning to physical therapy if needed    Person(s) Educated Patient  Methods Explanation;Demonstration    Comprehension Verbalized understanding;Returned demonstration            PT Short Term Goals - 03/11/20 1510      PT SHORT TERM GOAL #1   Title Patient will be independent with HEP in order to improve functional outcomes.    Time 3    Period Weeks    Status Achieved    Target Date 02/27/20      PT SHORT TERM GOAL #2   Title Patient will report at least 25% improvement in symptoms for improved quality of life.    Time 3    Period Weeks    Status Achieved    Target Date 02/27/20             PT Long Term Goals - 03/11/20 1510      PT LONG TERM GOAL #1   Title Patient will report at least 75% improvement in symptoms for improved quality of life.    Baseline 12/21 75% improvement    Time 6    Period Weeks    Status Achieved      PT LONG TERM GOAL #2   Title Patient will be able to complete 5x STS in under 11.4 seconds in order to reduce the risk of falls.    Time 6    Period Weeks    Status Partially Met      PT LONG TERM GOAL #3   Title Patient will be able to ambulate at least 350 feet in 2MWT in order to demonstrate improved gait speed for community ambulation.    Time 6    Period Weeks    Status Not Met                 Plan - 03/11/20 1453    Clinical Impression Statement Patient has met 2/2 short term goals and 1/3 long term goals with ability to complete HEP, improvement in symptoms and improving functional mobility. Patient overall showing improving LE strength, lumbar mobility. Patient remains limited by intermittent knee pain, lumbar spine pain, impaired strength, ROM, gait, and transfers. Patient knee symptoms consistent with knee OA presentation. Patient feels she can continue to perform HEP independently  and is educated on returning to physical therapy if needed. Patient discharged from physical therapy at this time.    Personal Factors and Comorbidities Age;Behavior Pattern;Comorbidity 1;Time since onset of injury/illness/exacerbation;Fitness;Past/Current Experience;Finances    Comorbidities chronic back pain    Examination-Activity Limitations Bed Mobility;Bend;Lift;Stand;Stairs;Squat;Locomotion Level;Transfers    Examination-Participation Restrictions Cleaning;Meal Prep;Church;Community Activity;Volunteer;Yard Work;Shop    Stability/Clinical Decision Making Evolving/Moderate complexity    Rehab Potential Fair    PT Frequency --    PT Duration --    PT Treatment/Interventions ADLs/Self Care Home Management;Aquatic Therapy;Cryotherapy;Scientist, product/process development;Iontophoresis 69m/ml Dexamethasone;Moist Heat;Traction;Gait training;Stair training;Functional mobility training;Therapeutic activities;Therapeutic exercise;Balance training;Neuromuscular re-education;Patient/family education;Manual techniques;Compression bandaging;Passive range of motion;Scar mobilization;Dry needling;Energy conservation;Splinting;Spinal Manipulations;Joint Manipulations;Taping    PT Next Visit Plan n/a    PT Home Exercise Plan 11/17 LAQ 11/30: ab set, bridge, LTR, heel slides, SKTC; 12/3: POE and pressup. 02/26/20: DKTC, (hold PPU) 12/9 STS, step up    Consulted and Agree with Plan of Care Patient           Patient will benefit from skilled therapeutic intervention in order to improve the following deficits and impairments:  Abnormal gait,Decreased range of motion,Decreased endurance,Increased muscle spasms,Decreased activity tolerance,Pain,Decreased balance,Improper body mechanics,Decreased mobility,Decreased strength,Postural dysfunction  Visit Diagnosis: Left knee pain, unspecified chronicity  Muscle weakness (  generalized)  Other abnormalities of gait and mobility  Other symptoms and signs involving  the musculoskeletal system  Stiffness of left knee, not elsewhere classified  Low back pain, unspecified back pain laterality, unspecified chronicity, unspecified whether sciatica present     Problem List Patient Active Problem List   Diagnosis Date Noted   Leg swelling 11/18/2019   History of gastric ulcer 05/07/2019   Muscle pain 02/07/2019   Fatigue 02/07/2019   Dysphagia 11/11/2018   History of colonic polyps 11/11/2018   Obesity (BMI 30.0-34.9) 11/05/2018   Cervical spondylosis with radiculopathy 06/20/2017   Hypertension    Hyperlipidemia    Depression    CAD (coronary artery disease)    Anxiety    Osteoarthritis 02/17/2017   Family history of coronary artery disease in father 02/17/2017   Lumbar spondylosis with myelopathy 07/18/2016   Laryngopharyngeal reflux (LPR) 06/24/2016   Type 2 diabetes mellitus with vascular disease ([place]) 01/13/2015   Insomnia due to stress 08/20/2014   GERD (gastroesophageal reflux disease) 04/11/2014   At high risk for falls 12/31/2013   Postsurgical hypothyroidism 01/05/2013   Snoring 01/05/2013   Back pain with radiculopathy 11/28/2012   Anxiety and depression 10/16/2011   Anemia 03/30/2011   Posterior right knee pain 01/24/2011   Vitamin D deficiency 10/14/2010   Bilateral carotid bruits 10/13/2010   Allergic rhinitis 11/23/2007   CARPAL TUNNEL SYNDROME, BILATERAL 04/14/2007   Coronary atherosclerosis 04/14/2007   Headache 04/14/2007   Thyroid cancer ([place]) 03/23/1999    3:27 PM, 03/11/20 Mearl Latin PT, DPT Physical Therapist at [place], [place], [address] Phone: [phone]   Fax:  [phone]  Name: Jennifer Blackburn MRN: [id number] Date of Birth: 18-Jun-1945

## 2020-03-11 NOTE — Telephone Encounter (Signed)
Pt called and states you told her to call back if she continued to have pain in her left leg and she is and is now wanting a scan or something to see what is wrong. Please advise

## 2020-03-13 ENCOUNTER — Ambulatory Visit (HOSPITAL_COMMUNITY): Payer: Medicare HMO | Admitting: Physical Therapy

## 2020-03-13 NOTE — Telephone Encounter (Signed)
Called patient and left message for them to return call at the office. Also told her I would reach out via mychart and she could respond there or call back

## 2020-03-17 NOTE — Telephone Encounter (Signed)
Left message

## 2020-03-18 ENCOUNTER — Ambulatory Visit (HOSPITAL_COMMUNITY): Payer: Medicare HMO | Admitting: Physical Therapy

## 2020-03-18 ENCOUNTER — Other Ambulatory Visit: Payer: Self-pay | Admitting: Family Medicine

## 2020-03-18 NOTE — Telephone Encounter (Signed)
Per mychart pt states she will follow up with dr Venetia Maxon at neurosurgery

## 2020-03-20 ENCOUNTER — Telehealth: Payer: Self-pay

## 2020-03-20 ENCOUNTER — Ambulatory Visit: Payer: Self-pay

## 2020-03-20 ENCOUNTER — Ambulatory Visit (HOSPITAL_COMMUNITY): Payer: Medicare HMO | Admitting: Physical Therapy

## 2020-03-20 NOTE — Telephone Encounter (Signed)
Bad headache, stiff neck. Advised her to call the covid line to get tested, and to stay in qaurantine until results

## 2020-03-20 NOTE — Telephone Encounter (Signed)
Patient would like to speak with nurse about headache and stiff neck for recommendations. Please advise   Pt. States granddaughter has tested positive for COVID 19. Concerned because she has chills, headache and stiff neck that started last week. Instructed to contact her PCP about her symptoms. Verbalizes understanding.

## 2020-03-22 ENCOUNTER — Other Ambulatory Visit: Payer: Self-pay | Admitting: Cardiovascular Disease

## 2020-03-22 ENCOUNTER — Other Ambulatory Visit: Payer: Self-pay | Admitting: Family Medicine

## 2020-03-24 ENCOUNTER — Other Ambulatory Visit: Payer: Medicare HMO

## 2020-03-25 ENCOUNTER — Other Ambulatory Visit: Payer: Self-pay

## 2020-03-25 ENCOUNTER — Other Ambulatory Visit: Payer: Medicare HMO

## 2020-03-25 DIAGNOSIS — Z20822 Contact with and (suspected) exposure to covid-19: Secondary | ICD-10-CM | POA: Diagnosis not present

## 2020-03-26 ENCOUNTER — Ambulatory Visit (HOSPITAL_COMMUNITY): Payer: Medicare HMO | Admitting: Physical Therapy

## 2020-03-27 ENCOUNTER — Telehealth: Payer: Self-pay | Admitting: Internal Medicine

## 2020-03-27 ENCOUNTER — Other Ambulatory Visit: Payer: Self-pay | Admitting: *Deleted

## 2020-03-27 LAB — NOVEL CORONAVIRUS, NAA: SARS-CoV-2, NAA: NOT DETECTED

## 2020-03-27 LAB — SARS-COV-2, NAA 2 DAY TAT

## 2020-03-27 MED ORDER — ACCU-CHEK GUIDE VI STRP: 1.0000 | ORAL_STRIP | 3 refills | Status: DC | PRN

## 2020-03-27 NOTE — Telephone Encounter (Signed)
Rx sent 

## 2020-03-27 NOTE — Telephone Encounter (Signed)
Patient called to request a new RX for New RX for Accuchek Guide Test Strips with refills be sent to  [id number] - [place], [place] - [address] AT [facility] Phone:  [phone]  Fax:  [phone]

## 2020-04-02 ENCOUNTER — Other Ambulatory Visit: Payer: Self-pay

## 2020-04-02 ENCOUNTER — Encounter: Payer: Self-pay | Admitting: Podiatry

## 2020-04-02 ENCOUNTER — Ambulatory Visit (INDEPENDENT_AMBULATORY_CARE_PROVIDER_SITE_OTHER): Payer: Medicare HMO | Admitting: Podiatry

## 2020-04-02 DIAGNOSIS — B351 Tinea unguium: Secondary | ICD-10-CM | POA: Diagnosis not present

## 2020-04-02 DIAGNOSIS — M79674 Pain in right toe(s): Secondary | ICD-10-CM

## 2020-04-02 DIAGNOSIS — E1159 Type 2 diabetes mellitus with other circulatory complications: Secondary | ICD-10-CM | POA: Diagnosis not present

## 2020-04-02 DIAGNOSIS — M79675 Pain in left toe(s): Secondary | ICD-10-CM

## 2020-04-02 NOTE — Progress Notes (Signed)
  Subjective:  Patient ID: Jennifer Blackburn, female    DOB: 1945-08-03,  MRN: [id number]  Chief Complaint  Patient presents with  . diabetic foot care    Foot exam   75 y.o. female returns for the above complaint.  Patient presents with thickened elongated dystrophic toenails x10.  Patient is a diabetic with last A1c of 9.7.  Patient states that she would like to have the nails debrided out as she is not able to do it herself.  They are mildly painful when ambulating.  She denies any other acute complaints.  Objective:  There were no vitals filed for this visit. Podiatric Exam: Vascular: dorsalis pedis and posterior tibial pulses are palpable bilateral. Capillary return is immediate. Temperature gradient is WNL. Skin turgor WNL  Sensorium: Normal Semmes Weinstein monofilament test. Normal tactile sensation bilaterally. Nail Exam: Pt has thick disfigured discolored nails with subungual debris noted bilateral entire nail hallux through fifth toenails.  Pain on palpation to the nails. Ulcer Exam: There is no evidence of ulcer or pre-ulcerative changes or infection. Orthopedic Exam: Muscle tone and strength are WNL. No limitations in general ROM. No crepitus or effusions noted. HAV  B/L.  Hammer toes 2-5  B/L. Skin: No Porokeratosis. No infection or ulcers    Assessment & Plan:   1. Pain due to onychomycosis of toenails of both feet   2. Type 2 diabetes mellitus with vascular disease ([place])     Patient was evaluated and treated and all questions answered.  Onychomycosis with pain  -Nails palliatively debrided as below. -Educated on self-care  Procedure: Nail Debridement Rationale: pain  Type of Debridement: manual, sharp debridement. Instrumentation: Nail nipper, rotary burr. Number of Nails: 10  Procedures and Treatment: Consent by patient was obtained for treatment procedures. The patient understood the discussion of treatment and procedures well. All questions were answered  thoroughly reviewed. Debridement of mycotic and hypertrophic toenails, 1 through 5 bilateral and clearing of subungual debris. No ulceration, no infection noted.  Return Visit-Office Procedure: Patient instructed to return to the office for a follow up visit 3 months for continued evaluation and treatment.  Boneta Lucks, DPM    No follow-ups on file.

## 2020-04-03 ENCOUNTER — Other Ambulatory Visit: Payer: Self-pay | Admitting: Cardiovascular Disease

## 2020-04-07 ENCOUNTER — Other Ambulatory Visit: Payer: Self-pay

## 2020-04-07 ENCOUNTER — Encounter: Payer: Self-pay | Admitting: Nurse Practitioner

## 2020-04-07 ENCOUNTER — Telehealth (INDEPENDENT_AMBULATORY_CARE_PROVIDER_SITE_OTHER): Payer: Medicare HMO | Admitting: Nurse Practitioner

## 2020-04-07 ENCOUNTER — Ambulatory Visit (HOSPITAL_COMMUNITY): Payer: Medicare HMO

## 2020-04-07 ENCOUNTER — Ambulatory Visit: Payer: Medicare HMO | Admitting: Nurse Practitioner

## 2020-04-07 DIAGNOSIS — I10 Essential (primary) hypertension: Secondary | ICD-10-CM

## 2020-04-07 DIAGNOSIS — R519 Headache, unspecified: Secondary | ICD-10-CM

## 2020-04-07 NOTE — Assessment & Plan Note (Addendum)
-  resolved prior to our call today -she states it lasted several days last week, and her husband wanted her to be seen by a provider -Continue PRN tylenol -discussed avoidance of NSAIDs with ASA therapy

## 2020-04-07 NOTE — Assessment & Plan Note (Signed)
-  BP well controlled today -she did not check her BP when she had a headache -we discussed checkin BP when headache occurs

## 2020-04-07 NOTE — Progress Notes (Signed)
Acute Office Visit  Subjective:    Patient ID: Jennifer Blackburn, female    DOB: [birth year], 75 y.o.   MRN: [id number]  Chief Complaint  Patient presents with  . Headache  . Hypertension    Resolving - has not felt it going up as much. Monitoring diet    HPI Patient is in today for headache and high blood pressure For HTN, her BP was 122/79 today. She does not do home BP checks. Several weeks ago, she was having a lot of headaches, and she was taking tylenol for this.  Last week she was experiencing a headache, but it has since resolved. She did not check her BP at that time.  Past Medical History:  Diagnosis Date  . Anxiety   . Arthritis   . CAD (coronary artery disease)   . Depression   . Diabetes mellitus type II    without complication  . DJD (degenerative joint disease) of lumbar spine   . Hypercholesteremia   . Hyperlipidemia   . Hypertension    benign   . Hypothyroidism   . Thyroid cancer ([place]) 2001    Past Surgical History:  Procedure Laterality Date  . ABDOMINAL HYSTERECTOMY    . CARDIAC CATHETERIZATION    . CATARACT EXTRACTION W/PHACO Right 07/17/2012   Procedure: CATARACT EXTRACTION PHACO AND INTRAOCULAR LENS PLACEMENT (IOC);  Surgeon: Tonny Branch, MD;  Location: [facility];  Service: Ophthalmology;  Laterality: Right;  CDE:25.51  . CATARACT EXTRACTION W/PHACO Left 09/17/2016   Procedure: CATARACT EXTRACTION PHACO AND INTRAOCULAR LENS PLACEMENT LEFT EYE;  Surgeon: Tonny Branch, MD;  Location: [facility];  Service: Ophthalmology;  Laterality: Left;  CDE: 19.23  . COLONOSCOPY    . COLONOSCOPY N/A 01/15/2014   Procedure: COLONOSCOPY;  Surgeon: Daneil Dolin, MD;  Location: [facility];  Service: Endoscopy;  Laterality: N/A;  9:00 AM  . COLONOSCOPY WITH PROPOFOL N/A 01/25/2019   Procedure: COLONOSCOPY WITH PROPOFOL;  Surgeon: Daneil Dolin, MD; diverticulosis in the entire colon, otherwise normal exam.  No repeat colonoscopy due to age.  . DOPPLER ECHOCARDIOGRAPHY    .  ESOPHAGOGASTRODUODENOSCOPY (EGD) WITH PROPOFOL N/A 01/25/2019   Procedure: ESOPHAGOGASTRODUODENOSCOPY (EGD) WITH PROPOFOL;  Surgeon: Daneil Dolin, MD; normal esophagus without dilation due to inability to pass dilator beyond the hypopharynx, 1 small nonbleeding gastric ulcer s/p biopsied, otherwise normal exam.  Pathology with ulcer with reactive changes, no H. pylori, metaplasia, dysplasia, or malignancy.  . ESOPHAGOGASTRODUODENOSCOPY (EGD) WITH PROPOFOL N/A 08/21/2019   Procedure: ESOPHAGOGASTRODUODENOSCOPY (EGD) WITH PROPOFOL;  Surgeon: Daneil Dolin, MD; Normal esophagus. Previously noted gastric ulcer completely healed. Normal examined duodenum.   . JOINT REPLACEMENT  07/01/2010   left hip  . KNEE SURGERY Right    arthroscopy  . left hip replaced  07/01/2010  . SPINE SURGERY  2006   cervical  . stress dipyridamole myocardial perfusion    . THYROIDECTOMY    . TONSILLECTOMY    . VESICOVAGINAL FISTULA CLOSURE W/ TAH      Family History  Problem Relation Age of Onset  . Heart attack Father   . Heart failure Mother   . Asthma Daughter   . Sleep apnea Son        CPAP  . Colon cancer Neg Hx     Social History   Socioeconomic History  . Marital status: Married    Spouse name: Not on file  . Number of children: 2  . Years of education: 87  .  Highest education level: Not on file  Occupational History  . Occupation: retired     Comment: bank  Tobacco Use  . Smoking status: Passive Smoke Exposure - Never Smoker  . Smokeless tobacco: Never Used  . Tobacco comment: Husband smokes in the home  Vaping Use  . Vaping Use: Never used  Substance and Sexual Activity  . Alcohol use: No  . Drug use: No  . Sexual activity: Never  Other Topics Concern  . Not on file  Social History Narrative   Lives with husband, at home   No caffeine   Social Determinants of Health   Financial Resource Strain: Not on file  Food Insecurity: No Food Insecurity  . Worried About Charity fundraiser  in the Last Year: Never true  . Ran Out of Food in the Last Year: Never true  Transportation Needs: No Transportation Needs  . Lack of Transportation (Medical): No  . Lack of Transportation (Non-Medical): No  Physical Activity: Insufficiently Active  . Days of Exercise per Week: 4 days  . Minutes of Exercise per Session: 30 min  Stress: No Stress Concern Present  . Feeling of Stress : Not at all  Social Connections: Socially Integrated  . Frequency of Communication with Friends and Family: More than three times a week  . Frequency of Social Gatherings with Friends and Family: Once a week  . Attends Religious Services: More than 4 times per year  . Active Member of Clubs or Organizations: Yes  . Attends Archivist Meetings: More than 4 times per year  . Marital Status: Married  Human resources officer Violence: Not on file    Outpatient Medications Prior to Visit  Medication Sig Dispense Refill  . acetaminophen (TYLENOL) 500 MG tablet Take 1,000 mg by mouth every 6 (six) hours as needed (for pain.).    Marland Kitchen aspirin EC 81 MG tablet Take 1 tablet (81 mg total) by mouth daily.    . Cetirizine HCl 10 MG TBDP Take one tablet by mouth two times daily for allergy 60 tablet 5  . Cholecalciferol (VITAMIN D3) 50 MCG (2000 UT) TABS Take 2,000 Units by mouth daily.    . furosemide (LASIX) 20 MG tablet TAKE 1 TABLET BY MOUTH EVERY DAY 90 tablet 1  . gabapentin (NEURONTIN) 100 MG capsule TAKE 1 CAPSULE BY MOUTH 3 TIMES A DAY 270 capsule 1  . glucose blood (ACCU-CHEK GUIDE) test strip 1 each by Other route as needed for other. Use as instructed to check blood sugar 2 times per day dx code E11.65 100 each 3  . hydrOXYzine (ATARAX/VISTARIL) 10 MG tablet TAKE ONE TABLET TWICE DAILY AND TWO TABLETS AT BEDTIME AS NEEDED, FOR GENERALIZED ITCHING 360 tablet 1  . imipramine (TOFRANIL) 25 MG tablet TAKE 4 TABLETS BY MOUTH AT BEDTIME 360 tablet 0  . insulin aspart (NOVOLOG) 100 UNIT/ML FlexPen Inject 6-10 units  under the skin 15 min before dinner 15 mL 11  . insulin glargine, 1 Unit Dial, (TOUJEO SOLOSTAR) 300 UNIT/ML Solostar Pen Inject 30-50 units two times daily 10.5 mL 2  . Insulin Pen Needle 32G X 4 MM MISC Use 4x a day 300 each 3  . isosorbide mononitrate (IMDUR) 30 MG 24 hr tablet TAKE HALF A TABLET BY MOUTH EVERY DAY (Patient taking differently: Take 15 mg by mouth daily.) 45 tablet 3  . Lancets (ACCU-CHEK MULTICLIX) lancets Use as instructed three times daily dx 250.01 100 each 5  . metFORMIN (GLUCOPHAGE) 1000 MG  tablet TAKE 1 TABLET BY MOUTH TWICE A DAY 180 tablet 1  . metoprolol tartrate (LOPRESSOR) 50 MG tablet TAKE 1 TABLET BY MOUTH TWICE A DAY 30 tablet 0  . OZEMPIC, 0.25 OR 0.5 MG/DOSE, 2 MG/1.5ML SOPN INJECT 0.5 MG INTO THE SKIN ONCE A WEEK. 3 pen 2  . pantoprazole (PROTONIX) 40 MG tablet TAKE 1 TABLET BY MOUTH EVERY DAY 90 tablet 1  . PARoxetine (PAXIL) 20 MG tablet TAKE 1 TABLET BY MOUTH EVERY DAY 90 tablet 1  . potassium chloride SA (KLOR-CON) 20 MEQ tablet Take 1 tablet (20 mEq total) by mouth daily. 30 tablet 3  . quinapril (ACCUPRIL) 10 MG tablet TAKE 1 TABLET BY MOUTH EVERY DAY 90 tablet 1  . simethicone (PHAZYME) 125 MG chewable tablet Chew 1 tablet (125 mg total) by mouth every 6 (six) hours as needed for flatulence. 30 tablet 0  . SYNTHROID 137 MCG tablet TAKE 1 TABLET BY MOUTH DAILY BEFORE BREAKFAST. 90 tablet 1  . verapamil (CALAN-SR) 180 MG CR tablet TAKE 1 TABLET BY MOUTH EVERYDAY AT BEDTIME 30 tablet 2  . vitamin B-12 (CYANOCOBALAMIN) 1000 MCG tablet Take 1 tablet (1,000 mcg total) by mouth daily. 30 tablet 5   No facility-administered medications prior to visit.    Allergies  Allergen Reactions  . Lipitor [Atorvastatin Calcium] Other (See Comments)    Muscle aches  . Daypro [Oxaprozin] Hives  . Nsaids Hives  . Sulfonamide Derivatives Hives    Review of Systems  Constitutional: Negative.   Respiratory: Negative.   Cardiovascular: Negative.   Neurological:  Positive for headaches. Negative for dizziness, syncope, facial asymmetry and speech difficulty.       Objective:    Physical Exam  LMP 11/11/2016  Wt Readings from Last 3 Encounters:  02/26/20 189 lb 1.9 oz (85.8 kg)  01/04/20 188 lb (85.3 kg)  12/25/19 187 lb (84.8 kg)    Health Maintenance Due  Topic Date Due  . COVID-19 Vaccine (3 - Moderna risk 4-dose series) 06/27/2019    There are no preventive care reminders to display for this patient.   Lab Results  Component Value Date   TSH 0.05 (L) 08/31/2019   Lab Results  Component Value Date   WBC 7.5 05/07/2019   HGB 12.2 05/07/2019   HCT 38.3 05/07/2019   MCV 84.4 05/07/2019   PLT 283 05/07/2019   Lab Results  Component Value Date   NA 137 12/25/2019   K 4.2 12/25/2019   CO2 30 12/25/2019   GLUCOSE 246 (H) 12/25/2019   BUN 13 12/25/2019   CREATININE 0.81 12/25/2019   BILITOT 0.3 05/07/2019   ALKPHOS 33 07/13/2016   AST 17 05/07/2019   ALT 18 05/07/2019   PROT 7.8 05/07/2019   ALBUMIN 4.0 07/13/2016   CALCIUM 9.4 12/25/2019   ANIONGAP 13 08/15/2019   Lab Results  Component Value Date   CHOL 157 09/04/2019   Lab Results  Component Value Date   HDL 43 (L) 09/04/2019   Lab Results  Component Value Date   LDLCALC 91 09/04/2019   Lab Results  Component Value Date   TRIG 133 09/04/2019   Lab Results  Component Value Date   CHOLHDL 3.7 09/04/2019   Lab Results  Component Value Date   HGBA1C 9.7 (A) 01/04/2020       Assessment & Plan:   Problem List Items Addressed This Visit      Cardiovascular and Mediastinum   Hypertension    -BP well controlled  today -she did not check her BP when she had a headache -we discussed checkin BP when headache occurs        Other   Headache - Primary    -resolved prior to our call today -she states it lasted several days last week, and her husband wanted her to be seen by a provider -Continue PRN tylenol -discussed avoidance of NSAIDs with ASA  therapy           No orders of the defined types were placed in this encounter.  Date:  04/07/2020   Location of Patient: Home Location of Provider: Office Consent was obtain for visit to be over via telehealth. I verified that I am speaking with the correct person using two identifiers.  I connected with  Antoine Primas on 04/07/20 via telephone and verified that I am speaking with the correct person using two identifiers.   I discussed the limitations of evaluation and management by telemedicine. The patient expressed understanding and agreed to proceed.  Time spent: 8 minutes   Noreene Larsson, NP

## 2020-04-09 ENCOUNTER — Other Ambulatory Visit: Payer: Self-pay

## 2020-04-09 ENCOUNTER — Ambulatory Visit (INDEPENDENT_AMBULATORY_CARE_PROVIDER_SITE_OTHER): Payer: Medicare HMO | Admitting: Cardiovascular Disease

## 2020-04-09 ENCOUNTER — Encounter: Payer: Self-pay | Admitting: Cardiovascular Disease

## 2020-04-09 VITALS — BP 114/56 | HR 67 | Ht 65.0 in | Wt 188.0 lb

## 2020-04-09 DIAGNOSIS — I10 Essential (primary) hypertension: Secondary | ICD-10-CM | POA: Diagnosis not present

## 2020-04-09 DIAGNOSIS — I2583 Coronary atherosclerosis due to lipid rich plaque: Secondary | ICD-10-CM | POA: Diagnosis not present

## 2020-04-09 DIAGNOSIS — I2510 Atherosclerotic heart disease of native coronary artery without angina pectoris: Secondary | ICD-10-CM

## 2020-04-09 DIAGNOSIS — M7989 Other specified soft tissue disorders: Secondary | ICD-10-CM

## 2020-04-09 DIAGNOSIS — E782 Mixed hyperlipidemia: Secondary | ICD-10-CM

## 2020-04-09 MED ORDER — ISOSORBIDE MONONITRATE ER 30 MG PO TB24
ORAL_TABLET | ORAL | 3 refills | Status: DC
Start: 2020-04-09 — End: 2022-09-10

## 2020-04-09 MED ORDER — VERAPAMIL HCL ER 180 MG PO TBCR: EXTENDED_RELEASE_TABLET | ORAL | 3 refills | Status: DC

## 2020-04-09 MED ORDER — METOPROLOL TARTRATE 50 MG PO TABS: 50.0000 mg | ORAL_TABLET | Freq: Two times a day (BID) | ORAL | 3 refills | Status: DC

## 2020-04-09 NOTE — Assessment & Plan Note (Signed)
She had minimal CAD by cath which I performed June 2005.  She had a negative stress test 02/23/2017.  She denies chest pain.

## 2020-04-09 NOTE — Assessment & Plan Note (Signed)
History of hyperlipidemia not on statin therapy with lipid profile performed 09/04/2019 revealing total cholesterol 157, LDL 91 and HDL 43.

## 2020-04-09 NOTE — Progress Notes (Signed)
04/09/2020 Jennifer Blackburn   03-19-1946  [id number]  Primary Physician Fayrene Helper, MD Primary Cardiologist: Lorretta Harp MD FACP, [place], [place], [place]  HPI:  Jennifer Blackburn is a 75 y.o.  moderately overweight, married [place] American female, mother of 2, grandmother to 4 grandchildren who I last saw in the office 04/11/2018. She has a history of minimal CAD by cath back in June 2005 with normal LV function. Her other problems include treated hypertension, diabetes and dyslipidemia. She does have a strong family history of heart disease with a father that died of an MI at age 38. Most recent lipid profile performed by Dr. Moshe Cipro on 11/14/14 revealing a total cholesterol 175, LDL of 112 and HDL of 42.. Dr. Moshe Cipro continues to follow her with profile . She has had several episodes of nocturnal chest pressure which has awakened her from sleep with a "heavy sensation on her chest and a feeling of diaphoresis since I last saw her but these are infrequent and have not changed frequency or severity. She did see Kerin Ransom in the office 02/17/17 complaining of chest pain and a Myoview stress test performed 02/23/17 was low risk.   Since I saw her in the office 2 years ago, she is been asymptomatic denying chest pain or shortness of breath.   Current Meds  Medication Sig  . acetaminophen (TYLENOL) 500 MG tablet Take 1,000 mg by mouth every 6 (six) hours as needed (for pain.).  Marland Kitchen aspirin EC 81 MG tablet Take 1 tablet (81 mg total) by mouth daily.  . Cetirizine HCl 10 MG TBDP Take one tablet by mouth two times daily for allergy  . Cholecalciferol (VITAMIN D3) 50 MCG (2000 UT) TABS Take 2,000 Units by mouth daily.  . furosemide (LASIX) 20 MG tablet TAKE 1 TABLET BY MOUTH EVERY DAY  . gabapentin (NEURONTIN) 100 MG capsule TAKE 1 CAPSULE BY MOUTH 3 TIMES A DAY  . glucose blood (ACCU-CHEK GUIDE) test strip 1 each by Other route as needed for other. Use as instructed to check blood sugar 2  times per day dx code E11.65  . hydrOXYzine (ATARAX/VISTARIL) 10 MG tablet TAKE ONE TABLET TWICE DAILY AND TWO TABLETS AT BEDTIME AS NEEDED, FOR GENERALIZED ITCHING  . imipramine (TOFRANIL) 25 MG tablet TAKE 4 TABLETS BY MOUTH AT BEDTIME  . insulin aspart (NOVOLOG) 100 UNIT/ML FlexPen Inject 6-10 units under the skin 15 min before dinner  . insulin glargine, 1 Unit Dial, (TOUJEO SOLOSTAR) 300 UNIT/ML Solostar Pen Inject 30-50 units two times daily  . Insulin Pen Needle 32G X 4 MM MISC Use 4x a day  . Lancets (ACCU-CHEK MULTICLIX) lancets Use as instructed three times daily dx 250.01  . metFORMIN (GLUCOPHAGE) 1000 MG tablet TAKE 1 TABLET BY MOUTH TWICE A DAY  . OZEMPIC, 0.25 OR 0.5 MG/DOSE, 2 MG/1.5ML SOPN INJECT 0.5 MG INTO THE SKIN ONCE A WEEK.  . pantoprazole (PROTONIX) 40 MG tablet TAKE 1 TABLET BY MOUTH EVERY DAY  . PARoxetine (PAXIL) 20 MG tablet TAKE 1 TABLET BY MOUTH EVERY DAY  . potassium chloride SA (KLOR-CON) 20 MEQ tablet Take 1 tablet (20 mEq total) by mouth daily.  . quinapril (ACCUPRIL) 10 MG tablet TAKE 1 TABLET BY MOUTH EVERY DAY  . simethicone (PHAZYME) 125 MG chewable tablet Chew 1 tablet (125 mg total) by mouth every 6 (six) hours as needed for flatulence.  Marland Kitchen SYNTHROID 137 MCG tablet TAKE 1 TABLET BY MOUTH DAILY BEFORE BREAKFAST.  Marland Kitchen  vitamin B-12 (CYANOCOBALAMIN) 1000 MCG tablet Take 1 tablet (1,000 mcg total) by mouth daily.  . [DISCONTINUED] isosorbide mononitrate (IMDUR) 30 MG 24 hr tablet TAKE HALF A TABLET BY MOUTH EVERY DAY (Patient taking differently: Take 15 mg by mouth daily.)  . [DISCONTINUED] metoprolol tartrate (LOPRESSOR) 50 MG tablet TAKE 1 TABLET BY MOUTH TWICE A DAY  . [DISCONTINUED] verapamil (CALAN-SR) 180 MG CR tablet TAKE 1 TABLET BY MOUTH EVERYDAY AT BEDTIME     Allergies  Allergen Reactions  . Lipitor [Atorvastatin Calcium] Other (See Comments)    Muscle aches  . Daypro [Oxaprozin] Hives  . Nsaids Hives  . Sulfonamide Derivatives Hives    Social  History   Socioeconomic History  . Marital status: Married    Spouse name: Not on file  . Number of children: 2  . Years of education: 19  . Highest education level: Not on file  Occupational History  . Occupation: retired     Comment: bank  Tobacco Use  . Smoking status: Passive Smoke Exposure - Never Smoker  . Smokeless tobacco: Never Used  . Tobacco comment: Husband smokes in the home  Vaping Use  . Vaping Use: Never used  Substance and Sexual Activity  . Alcohol use: No  . Drug use: No  . Sexual activity: Never  Other Topics Concern  . Not on file  Social History Narrative   Lives with husband, at home   No caffeine   Social Determinants of Health   Financial Resource Strain: Not on file  Food Insecurity: No Food Insecurity  . Worried About Charity fundraiser in the Last Year: Never true  . Ran Out of Food in the Last Year: Never true  Transportation Needs: No Transportation Needs  . Lack of Transportation (Medical): No  . Lack of Transportation (Non-Medical): No  Physical Activity: Insufficiently Active  . Days of Exercise per Week: 4 days  . Minutes of Exercise per Session: 30 min  Stress: No Stress Concern Present  . Feeling of Stress : Not at all  Social Connections: Socially Integrated  . Frequency of Communication with Friends and Family: More than three times a week  . Frequency of Social Gatherings with Friends and Family: Once a week  . Attends Religious Services: More than 4 times per year  . Active Member of Clubs or Organizations: Yes  . Attends Archivist Meetings: More than 4 times per year  . Marital Status: Married  Human resources officer Violence: Not on file     Review of Systems: General: negative for chills, fever, night sweats or weight changes.  Cardiovascular: negative for chest pain, dyspnea on exertion, edema, orthopnea, palpitations, paroxysmal nocturnal dyspnea or shortness of breath Dermatological: negative for  rash Respiratory: negative for cough or wheezing Urologic: negative for hematuria Abdominal: negative for nausea, vomiting, diarrhea, bright red blood per rectum, melena, or hematemesis Neurologic: negative for visual changes, syncope, or dizziness All other systems reviewed and are otherwise negative except as noted above.    Blood pressure (!) 114/56, pulse 67, height 5\' 5"  (1.651 m), weight 188 lb (85.3 kg), last menstrual period 11/11/2016.  General appearance: alert and no distress Neck: no adenopathy, no carotid bruit, no JVD, supple, symmetrical, trachea midline and thyroid not enlarged, symmetric, no tenderness/mass/nodules Lungs: clear to auscultation bilaterally Heart: regular rate and rhythm, S1, S2 normal, no murmur, click, rub or gallop Extremities: extremities normal, atraumatic, no cyanosis or edema Pulses: 2+ and symmetric Skin: Skin color, texture,  turgor normal. No rashes or lesions Neurologic: Alert and oriented X 3, normal strength and tone. Normal symmetric reflexes. Normal coordination and gait  EKG sinus rhythm at 67 without ST or T wave changes.  I personally reviewed this EKG.  ASSESSMENT AND PLAN:   Coronary atherosclerosis She had minimal CAD by cath which I performed June 2005.  She had a negative stress test 02/23/2017.  She denies chest pain.  Hypertension History of essential hypertension a blood pressure measured today 114/56.  She is on metoprolol and verapamil, and quinapril.  Hyperlipidemia History of hyperlipidemia not on statin therapy with lipid profile performed 09/04/2019 revealing total cholesterol 157, LDL 91 and HDL 43.  Leg swelling History of bilateral lower extremity edema recently started on furosemide by PCP with improvement.      Lorretta Harp MD FACP,FACC,FAHA, [facility] 04/09/2020 3:46 PM

## 2020-04-09 NOTE — Assessment & Plan Note (Addendum)
History of essential hypertension a blood pressure measured today 114/56.  She is on metoprolol and verapamil, and quinapril.

## 2020-04-09 NOTE — Patient Instructions (Signed)

## 2020-04-09 NOTE — Assessment & Plan Note (Signed)
History of bilateral lower extremity edema recently started on furosemide by PCP with improvement.

## 2020-04-17 ENCOUNTER — Ambulatory Visit (HOSPITAL_COMMUNITY)
Admission: RE | Admit: 2020-04-17 | Discharge: 2020-04-17 | Disposition: A | Discharge status: Home / Self Care | Payer: Medicare HMO | Source: Ambulatory Visit | Attending: Family Medicine | Admitting: Family Medicine

## 2020-04-17 ENCOUNTER — Other Ambulatory Visit: Payer: Self-pay

## 2020-04-17 DIAGNOSIS — Z1231 Encounter for screening mammogram for malignant neoplasm of breast: Secondary | ICD-10-CM | POA: Diagnosis not present

## 2020-05-09 ENCOUNTER — Ambulatory Visit: Payer: Medicare HMO | Admitting: Internal Medicine

## 2020-05-09 ENCOUNTER — Encounter: Payer: Self-pay | Admitting: Internal Medicine

## 2020-05-09 ENCOUNTER — Other Ambulatory Visit: Payer: Self-pay

## 2020-05-09 VITALS — BP 120/72 | HR 68 | Ht 65.0 in | Wt 190.6 lb

## 2020-05-09 DIAGNOSIS — C73 Malignant neoplasm of thyroid gland: Secondary | ICD-10-CM

## 2020-05-09 DIAGNOSIS — E1159 Type 2 diabetes mellitus with other circulatory complications: Secondary | ICD-10-CM

## 2020-05-09 DIAGNOSIS — E890 Postprocedural hypothyroidism: Secondary | ICD-10-CM

## 2020-05-09 LAB — T4, FREE: Free T4: 1.22 ng/dL (ref 0.60–1.60)

## 2020-05-09 LAB — TSH: TSH: 1.6 u[IU]/mL (ref 0.35–4.50)

## 2020-05-09 LAB — POCT GLYCOSYLATED HEMOGLOBIN (HGB A1C): Hemoglobin A1C: 10.5 % — AB (ref 4.0–5.6)

## 2020-05-09 NOTE — Patient Instructions (Addendum)
Please continue: - Metformin 1000 mg 2x a day with meals. - Toujeo 30 units in a.m. and 40 units at bedtime  Please increase:  - Novolog to 10-15 units before each meal  Please restart: - Ozempic 0.25 mg x1, then 0.5 mg weekly  Please look up: [place] Diabetes Cookbook  Please continue levothyroxine 137 mcg daily.  Take the thyroid hormone every day, with water, at least 30 minutes before breakfast, separated by at least 4 hours from: - acid reflux medications - calcium - iron - multivitamins  Please stop at the lab.  Please return in 3-4 months with your sugar log.

## 2020-05-09 NOTE — Progress Notes (Addendum)
Patient ID: Jennifer Blackburn, female   DOB: 1946-02-20, 75 y.o.   MRN: [id number]   This visit occurred during the SARS-CoV-2 public health emergency.  Safety protocols were in place, including screening questions prior to the visit, additional usage of staff PPE, and extensive cleaning of exam room while observing appropriate contact time as indicated for disinfecting solutions.    HPI: Jennifer Blackburn is a 75 y.o.-year-old female, returning for follow-up for DM2, dx in 2001, insulin-dependent since 2017, uncontrolled, with long-term complications (DR, CAD) and also papillary thyroid cancer and postsurgical hypothyroidism.  Last visit 4 months ago.  At last visit, she was less active due to back pain.  Sugars were higher.  At this visit, they are even higher.  DM2: Reviewed HbA1c levels: Lab Results  Component Value Date   HGBA1C 9.7 (A) 01/04/2020   HGBA1C 8.4 (A) 08/29/2019   HGBA1C 9.3 (A) 05/03/2019   HGBA1C 8.8 (A) 12/28/2018   HGBA1C 8.9 (A) 09/21/2018   HGBA1C 8.4 (A) 05/26/2018   HGBA1C 8.0 (A) 02/07/2018   HGBA1C 8.9 08/06/2017   HGBA1C 8.0 10/20/2015   HGBA1C 9.0 (H) 11/14/2014  01/12/2018: HbA1c 8.7% 08/05/2017: HbA1c 8.9% 07/11/2017: HbA1c 9.1%  Pt is on a regimen of: - Metformin 1000 mg 2x a day with meals. - Toujeo 30 units in a.m. and 50 units at bedtime >> 30 units in a.m. and 40 units at bedtime - Lispro 8-10 >> 10 mg 3x a day 15 min before dinner  - Ozempic 0.5 mg weekly-started 01/2019 >> not affordable >> now got Ozempic but did not start yet She was on Ozempic 0.5 mg weekly in a.m. >> 1 mg  - constipation >> stopped 06/2018.  We added it back 09/2018. She was on Victoza >> $$$. She was on Jardiance >> $$$. She was on Actos >> swelling - stopped Spring 2019. She was on Glipizide XL 5 mg daily >> stopped 07/2017.  We retried glipizide 5 mg twice a day but had to stop 12/2018 due to lack of effect.  Pt checks her sugars 1-2 times a day: - am: 160-268 >> 99,  120-160 >> 99, 110, 130-220 >> 99, 189-208, 270 - 2h after b'fast: n/c - before lunch: n/c - 2h after lunch: n/c >> 265-300s >> n/c >> 200s - before dinner: n/c >> 185-190 >> n/c - 2h after dinner: n/c - bedtime: 200s >> n/c >> 180-210 >> 160-260 >> n/c - nighttime: n/c Lowest sugar was 110 >> 137 >> 99 >> 99 ; she has hypoglycemia awareness in the 60s. Highest sugar was 320 >> 240 >> 260 >> 270.  Glucometer: AccuChek Aviva  Pt's meals are: - Breakfast:toast, cereal, milk; oatmeal; bacon + egg >> oatmeal + coffee - Lunch: skips >> 2 eggs, fruit - Dinner: hamburger, or chicken, or fish, + veggies, bread, water and tea - Snacks: 2- fruit She was seeing a dietitian in [place]. She walks for exercise.  -No CKD, last BUN/creatinine:  Lab Results  Component Value Date   BUN 13 12/25/2019   BUN 14 08/15/2019   CREATININE 0.81 12/25/2019   CREATININE 0.79 08/15/2019  On quinapril.  -+ HL; last set of lipids: Lab Results  Component Value Date   CHOL 157 09/04/2019   HDL 43 (L) 09/04/2019   LDLCALC 91 09/04/2019   TRIG 133 09/04/2019   CHOLHDL 3.7 09/04/2019  On pravastatin 80.  - last eye exam was in 10/2019: No DR reportedly. Dr. Eulas Post in [place].  She has cataracts OU.   -No numbness and tingling in her feet. Has a podiatrist.   On ASA 81.  Pt has no FH of DM.  H/o Thyroid cancer (2001), s/p RAI treatment, now with postsurgical hypothyroidism.  Previously on brand-name Synthroid, now generic levothyroxine.  Pt is on levothyroxine 137 mcg daily, taken: - in am - fasting - at least 30 min from b'fast - no calcium - no iron - no multivitamins - + PPIs (Protonix) moved during the day - not on Biotin  Reviewed her TFTs: Lab Results  Component Value Date   TSH 0.05 (L) 08/31/2019   TSH 0.04 (L) 12/28/2018   TSH 0.06 (L) 09/21/2018   TSH 0.61 05/26/2018   TSH 2.49 03/23/2016   TSH 0.070 (L) 10/13/2010   TSH 0.035 (L) 09/22/2006   TSH 0.015 (L)  06/17/2006  11/03/2017: TSH 0.044  Her thyroglobulin levels are detectable: Lab Results  Component Value Date   THYROGLB 0.4 (L) 08/31/2019   THYROGLB 0.3 (L) 09/21/2018   THYROGLB 0.7 (L) 05/26/2018   Lab Results  Component Value Date   THGAB <1 08/31/2019   THGAB <1 09/21/2018   THGAB <1 05/26/2018   Reviewed imaging test reports: Thyroid ultrasound (06/10/2018): 1.1 cm nonspecific calcified lesion in the right upper neck.  CT was recommended: There is no residual or recurrent tissue in the right or left thyroid beds. There is no evidence of abnormal adenopathy by short axis diameter measurement criteria There is a nonspecific calcified soft tissue mass measuring 1.1 cm in the right superior neck. This is of unknown significance. CT neck can be performed to further characterize.  CT (10/18/2018): Status post thyroidectomy. No soft tissue mass within the thyroidectomy bed. A 13 x 7 mm ovoid focus of calcification in the right aspect of the thyroidectomy bed likely corresponding with the finding on recent neck ultrasound and is unchanged as compared to neck CT 10/24/2007, benign.  No pathologically enlarged cervical chain lymph nodes. A nonenlarged calcified right supraclavicular lymph node is new from prior neck CT 10/24/2007 but also favored benign. Attention recommended on follow-up.  Reviewed records from Dr. Ronnald Collum: Patient is status post total thyroidectomy in 2004 1.3 cm papillary thyroid cancer of the right lobe, followed by 3x RAI tx's with  33 mCi I-131 and a fourth RAI dose inpatient: 125 mCi, at [facility].    Whole-body scan on 09/08/2007 showed an increase in the thyroid bed activity and the follow up study with PET scan 10/08/2007 showed mildly hypermetabolic cervical lymph nodes.    Repeat PET 05/10/2008 showed no significant changes felt likely to be due to to a reactive process.  11/03/2017: TSH 0.044, thyroglobulin 0.2, ATA <1.0  04/05/2017: TSH 0.021 (0.45-4.5),  thyroglobulin 0.3 (by IMA), ATA <1.0  Pt denies: - feeling nodules in neck - hoarseness - dysphagia - choking - SOB with lying down  She also has a history of HTN and anemia.  Her daughter lives in [place] and she is sick.  Patient travels between here in [place] frequently.   ROS: Constitutional: no weight gain/no weight loss, + fatigue, no subjective hyperthermia, no subjective hypothermia Eyes: no blurry vision, no xerophthalmia ENT: no sore throat, + see HPI Cardiovascular: no CP/no SOB/no palpitations/no leg swelling Respiratory: no cough/no SOB/no wheezing Gastrointestinal: no N/no V/no D/no C/no acid reflux Musculoskeletal: + muscle aches/+ joint aches Skin: no rashes, no hair loss Neurological: no tremors/no numbness/no tingling/no dizziness  I reviewed pt's medications, allergies, PMH,  social hx, family hx, and changes were documented in the history of present illness. Otherwise, unchanged from my initial visit note.  Past Medical History:  Diagnosis Date  . Anxiety   . Arthritis   . CAD (coronary artery disease)   . Depression   . Diabetes mellitus type II    without complication  . DJD (degenerative joint disease) of lumbar spine   . Hypercholesteremia   . Hyperlipidemia   . Hypertension    benign   . Hypothyroidism   . Thyroid cancer ([place]) 2001   Past Surgical History:  Procedure Laterality Date  . ABDOMINAL HYSTERECTOMY    . CARDIAC CATHETERIZATION    . CATARACT EXTRACTION W/PHACO Right 07/17/2012   Procedure: CATARACT EXTRACTION PHACO AND INTRAOCULAR LENS PLACEMENT (IOC);  Surgeon: Tonny Branch, MD;  Location: [facility];  Service: Ophthalmology;  Laterality: Right;  CDE:25.51  . CATARACT EXTRACTION W/PHACO Left 09/17/2016   Procedure: CATARACT EXTRACTION PHACO AND INTRAOCULAR LENS PLACEMENT LEFT EYE;  Surgeon: Tonny Branch, MD;  Location: [facility];  Service: Ophthalmology;  Laterality: Left;  CDE: 19.23  . COLONOSCOPY    . COLONOSCOPY N/A  01/15/2014   Procedure: COLONOSCOPY;  Surgeon: Daneil Dolin, MD;  Location: [facility] SU[facility];  Service: Endoscopy;  Laterality: N/A;  9:00 AM  . COLONOSCOPY WITH PROPOFOL N/A 01/25/2019   Procedure: COLONOSCOPY WITH PROPOFOL;  Surgeon: Daneil Dolin, MD; diverticulosis in the entire colon, otherwise normal exam.  No repeat colonoscopy due to age.  . DOPPLER ECHOCARDIOGRAPHY    . ESOPHAGOGASTRODUODENOSCOPY (EGD) WITH PROPOFOL N/A 01/25/2019   Procedure: ESOPHAGOGASTRODUODENOSCOPY (EGD) WITH PROPOFOL;  Surgeon: Daneil Dolin, MD; normal esophagus without dilation due to inability to pass dilator beyond the hypopharynx, 1 small nonbleeding gastric ulcer s/p biopsied, otherwise normal exam.  Pathology with ulcer with reactive changes, no H. pylori, metaplasia, dysplasia, or malignancy.  . ESOPHAGOGASTRODUODENOSCOPY (EGD) WITH PROPOFOL N/A 08/21/2019   Procedure: ESOPHAGOGASTRODUODENOSCOPY (EGD) WITH PROPOFOL;  Surgeon: Daneil Dolin, MD; Normal esophagus. Previously noted gastric ulcer completely healed. Normal examined duodenum.   . JOINT REPLACEMENT  07/01/2010   left hip  . KNEE SURGERY Right    arthroscopy  . left hip replaced  07/01/2010  . SPINE SURGERY  2006   cervical  . stress dipyridamole myocardial perfusion    . THYROIDECTOMY    . TONSILLECTOMY    . VESICOVAGINAL FISTULA CLOSURE W/ TAH     Social History   Socioeconomic History  . Marital status: Married    Spouse name: Not on file  . Number of children: 2  . Years of education: 29  . Highest education level: Not on file  Occupational History  . Occupation: retired     Comment: bank  Tobacco Use  . Smoking status: Passive Smoke Exposure - Never Smoker  . Smokeless tobacco: Never Used  . Tobacco comment: Husband smokes in the home  Vaping Use  . Vaping Use: Never used  Substance and Sexual Activity  . Alcohol use: No  . Drug use: No  . Sexual activity: Never  Other Topics Concern  . Not on file  Social History  Narrative   Lives with husband, at home   No caffeine   Social Determinants of Health   Financial Resource Strain: Not on file  Food Insecurity: No Food Insecurity  . Worried About Charity fundraiser in the Last Year: Never true  . Ran Out of Food in the Last Year: Never true  Transportation Needs:  No Transportation Needs  . Lack of Transportation (Medical): No  . Lack of Transportation (Non-Medical): No  Physical Activity: Insufficiently Active  . Days of Exercise per Week: 4 days  . Minutes of Exercise per Session: 30 min  Stress: No Stress Concern Present  . Feeling of Stress : Not at all  Social Connections: Socially Integrated  . Frequency of Communication with Friends and Family: More than three times a week  . Frequency of Social Gatherings with Friends and Family: Once a week  . Attends Religious Services: More than 4 times per year  . Active Member of Clubs or Organizations: Yes  . Attends Archivist Meetings: More than 4 times per year  . Marital Status: Married  Human resources officer Violence: Not on file   Current Outpatient Medications  Medication Sig Dispense Refill  . acetaminophen (TYLENOL) 500 MG tablet Take 1,000 mg by mouth every 6 (six) hours as needed (for pain.).    Marland Kitchen aspirin EC 81 MG tablet Take 1 tablet (81 mg total) by mouth daily.    . Cetirizine HCl 10 MG TBDP Take one tablet by mouth two times daily for allergy 60 tablet 5  . Cholecalciferol (VITAMIN D3) 50 MCG (2000 UT) TABS Take 2,000 Units by mouth daily.    . furosemide (LASIX) 20 MG tablet TAKE 1 TABLET BY MOUTH EVERY DAY 90 tablet 1  . gabapentin (NEURONTIN) 100 MG capsule TAKE 1 CAPSULE BY MOUTH 3 TIMES A DAY 270 capsule 1  . glucose blood (ACCU-CHEK GUIDE) test strip 1 each by Other route as needed for other. Use as instructed to check blood sugar 2 times per day dx code E11.65 100 each 3  . hydrOXYzine (ATARAX/VISTARIL) 10 MG tablet TAKE ONE TABLET TWICE DAILY AND TWO TABLETS AT BEDTIME  AS NEEDED, FOR GENERALIZED ITCHING 360 tablet 1  . imipramine (TOFRANIL) 25 MG tablet TAKE 4 TABLETS BY MOUTH AT BEDTIME 360 tablet 0  . insulin aspart (NOVOLOG) 100 UNIT/ML FlexPen Inject 6-10 units under the skin 15 min before dinner 15 mL 11  . insulin glargine, 1 Unit Dial, (TOUJEO SOLOSTAR) 300 UNIT/ML Solostar Pen Inject 30-50 units two times daily 10.5 mL 2  . Insulin Pen Needle 32G X 4 MM MISC Use 4x a day 300 each 3  . isosorbide mononitrate (IMDUR) 30 MG 24 hr tablet TAKE HALF A TABLET BY MOUTH EVERY DAY 45 tablet 3  . Lancets (ACCU-CHEK MULTICLIX) lancets Use as instructed three times daily dx 250.01 100 each 5  . metFORMIN (GLUCOPHAGE) 1000 MG tablet TAKE 1 TABLET BY MOUTH TWICE A DAY 180 tablet 1  . metoprolol tartrate (LOPRESSOR) 50 MG tablet Take 1 tablet (50 mg total) by mouth 2 (two) times daily. 180 tablet 3  . OZEMPIC, 0.25 OR 0.5 MG/DOSE, 2 MG/1.5ML SOPN INJECT 0.5 MG INTO THE SKIN ONCE A WEEK. 3 pen 2  . pantoprazole (PROTONIX) 40 MG tablet TAKE 1 TABLET BY MOUTH EVERY DAY 90 tablet 1  . PARoxetine (PAXIL) 20 MG tablet TAKE 1 TABLET BY MOUTH EVERY DAY 90 tablet 1  . potassium chloride SA (KLOR-CON) 20 MEQ tablet Take 1 tablet (20 mEq total) by mouth daily. 30 tablet 3  . quinapril (ACCUPRIL) 10 MG tablet TAKE 1 TABLET BY MOUTH EVERY DAY 90 tablet 1  . simethicone (PHAZYME) 125 MG chewable tablet Chew 1 tablet (125 mg total) by mouth every 6 (six) hours as needed for flatulence. 30 tablet 0  . SYNTHROID 137 MCG tablet  TAKE 1 TABLET BY MOUTH DAILY BEFORE BREAKFAST. 90 tablet 1  . verapamil (CALAN-SR) 180 MG CR tablet TAKE 1 TABLET BY MOUTH EVERYDAY AT BEDTIME 90 tablet 3  . vitamin B-12 (CYANOCOBALAMIN) 1000 MCG tablet Take 1 tablet (1,000 mcg total) by mouth daily. 30 tablet 5   No current facility-administered medications for this visit.     Allergies  Allergen Reactions  . Lipitor [Atorvastatin Calcium] Other (See Comments)    Muscle aches  . Daypro [Oxaprozin] Hives   . Nsaids Hives  . Sulfonamide Derivatives Hives   Family History  Problem Relation Age of Onset  . Heart attack Father   . Heart failure Mother   . Asthma Daughter   . Sleep apnea Son        CPAP  . Colon cancer Neg Hx     PE: BP 120/72   Pulse 68   Ht 5\' 5"  (1.651 m)   Wt 190 lb 9.6 oz (86.5 kg)   LMP 11/11/2016   SpO2 99%   BMI 31.72 kg/m  Wt Readings from Last 3 Encounters:  05/09/20 190 lb 9.6 oz (86.5 kg)  04/09/20 188 lb (85.3 kg)  02/26/20 189 lb 1.9 oz (85.8 kg)   Constitutional: overweight, in NAD Eyes: PERRLA, EOMI, no exophthalmos ENT: moist mucous membranes, no thyromegaly, no cervical lymphadenopathy Cardiovascular: RRR, No MRG Respiratory: CTA B Gastrointestinal: abdomen soft, NT, ND, BS+ Musculoskeletal: no deformities, strength intact in all 4 Skin: moist, warm, no rashes Neurological: no tremor with outstretched hands, DTR normal in all 4  ASSESSMENT: 1. DM2, insulin-dependent, uncontrolled, with long-term complications - CAD - DR  2.  Papillary thyroid cancer  3.  Postsurgical hypothyroidism  PLAN:  1. Patient with longstanding, uncontrolled, type 2 diabetes, on Metformin, basal insulin and now also GLP-1 receptor agonist.  At last visit I advised her that if sugars were still high after starting Trulicity to add back lispro, which she stopped before last visit.  At that time, HbA1c was higher, at 9.7%. -At today's visit, sugars are very high, at all times of the day.  She has not been able to continue a Trulicity (given sample at last visit), but is using rapid acting insulin before dinner.  However, unfortunately, she is not checking sugars in the evening so it is not clear whether the 10 units of NovoLog that she is injecting are helping.  We discussed about the importance of checking sugars more frequently at different times of the day and at this visit we will add NovoLog before each meal.  We will also start Ozempic initially at a low dose for  1 week and then increase to 0.5 mg weekly.  We also discussed about improving diet.  She has seen nutrition in the past.  I advised her to let me know if she wants me to refer her to nutrition again.  For now, she would like to hold off.  I gave her the name of a book and we discussed about healthy diet changes. - I suggested to:  Patient Instructions  Please continue: - Metformin 1000 mg 2x a day with meals. - Toujeo 30 units in a.m. and 40 units at bedtime  Please increase:  - Novolog to 10-15 units before each meal  Please restart: - Ozempic 0.25 mg x1, then 0.5 mg weekly  Please look up: [place] Diabetes Cookbook  Please continue levothyroxine 137 mcg daily.  Take the thyroid hormone every day, with  water, at least 30 minutes before breakfast, separated by at least 4 hours from: - acid reflux medications - calcium - iron - multivitamins  Please stop at the lab.  Please return in 3-4 months with your sugar log.   - we checked her HbA1c: 10.5% (even higher)  - advised to check sugars at different times of the day - 3x a day, rotating check times - advised for yearly eye exams >> she is UTD - return to clinic in 3-4 months   2.  Papillary thyroid cancer -Previous thyroid cancer records were reviewed from Dr. Ronnald Collum -She has metastatic thyroid cancer and she had 4 RAI treatments.  She also had increased signal on the PET scan from 2009, however, in 2010, another PET scan showed possible inflammatory lymph nodes in the area. -She continues to have detectable thyroglobulin levels.  Neck ultrasound showed a specific calcified lesion in the right upper neck and we also obtained a neck CT which showed a stable calcified mass in 2019, most likely benign.  -Her thyroglobulin levels are detectable, but stable -At this visit, we'll recheck her thyroglobulin and ATA antibodies  3.  Postsurgical hypothyroidism - latest thyroid labs reviewed with pt >> TSH was  suppressed at last visit: Lab Results  Component Value Date   TSH 0.05 (L) 08/31/2019   - she continues on LT4 137 mcg daily (dose decreased at last visit, but she did not return for labs afterwards) - pt feels good on this dose. - we discussed about taking the thyroid hormone every day, with water, >30 minutes before breakfast, separated by >4 hours from acid reflux medications, calcium, iron, multivitamins. Pt. is taking it correctly. - will check thyroid tests today: TSH and fT4 - If labs are abnormal, she will need to return for repeat TFTs in 1.5 months  Component     Latest Ref Rng & Units 05/09/2020  Thyroglobulin     ng/mL 1.2 (L)  Comment        TSH     0.35 - 4.50 uIU/mL 1.60  T4,Free(Direct)     0.60 - 1.60 ng/dL 1.22  Thyroglobulin Ab     < or = 1 IU/mL <1   TFTs are at normal, but thyroglobulin is elevated.  I would suggest another neck ultrasound for now.  Neck U/S (06/03/2020): Narrative & Impression CLINICAL DATA:  History of thyroid carcinoma. Rising thyroglobulin level.  EXAM: ULTRASOUND OF HEAD/NECK SOFT TISSUES  TECHNIQUE: Ultrasound examination of the head and neck soft tissues was performed in the area of clinical concern.  COMPARISON:  Prior ultrasound study on 06/13/2018 and CT studies of the neck on 10/18/2018 and 10/24/2007  FINDINGS: Ultrasound of the neck demonstrates no evidence of visible residual thyroid tissue or abnormal soft tissue mass.   Ovoid area of densely shadowing calcification again noted to the right of midline as seen by prior ultrasound and also demonstrated by prior CT studies of the neck with documented stability between 2009 and 2020 studies.   Small left cervical lymph node has a similar appearance to the prior ultrasound study measuring 0.5 cm in short axis.  IMPRESSION: No evidence by ultrasound of recurrent thyroid malignancy in the neck.   Stable right neck calcification which has been documented to be  benign and stable by prior CT.   Small left cervical lymph nodedemonstrates stable appearance and size by ultrasound  compared to the 2020 ultrasound.  Electronically Signed   By: Jenness Corner.D.  On: 06/03/2020 13:38  No clear thyroid cancer recurrence in neck.  I will recheck her thyroglobulin at next visit.  If still elevated, will proceed with a whole-body scan.  Philemon Kingdom, MD PhD [facility] Endocrinology

## 2020-05-10 ENCOUNTER — Other Ambulatory Visit: Payer: Self-pay | Admitting: Family Medicine

## 2020-05-10 DIAGNOSIS — I10 Essential (primary) hypertension: Secondary | ICD-10-CM

## 2020-05-12 LAB — THYROGLOBULIN LEVEL: Thyroglobulin: 1.2 ng/mL — ABNORMAL LOW

## 2020-05-12 LAB — THYROGLOBULIN ANTIBODY: Thyroglobulin Ab: <1 IU/mL (ref <=1)

## 2020-05-14 ENCOUNTER — Telehealth: Payer: Self-pay | Admitting: Internal Medicine

## 2020-05-14 DIAGNOSIS — E1159 Type 2 diabetes mellitus with other circulatory complications: Secondary | ICD-10-CM

## 2020-05-14 MED ORDER — TOUJEO SOLOSTAR 300 UNIT/ML ~~LOC~~ SOPN: PEN_INJECTOR | SUBCUTANEOUS | 2 refills | Status: DC

## 2020-05-14 NOTE — Telephone Encounter (Signed)
Patient called to find out why she was called to schedule a Thyroid scan.  Requested someone call her back to explain   Call back # [phone] (1st) or cell [phone] (2nd)

## 2020-05-14 NOTE — Telephone Encounter (Signed)
Called and spoke with pt advising lab results and MyChart message sent from Dr Cruzita Lederer requesting scanning at [facility].  Pt also requested new rx with corrected directions be sent to preferred pharmacy. RX sent

## 2020-05-20 ENCOUNTER — Telehealth: Payer: Self-pay | Admitting: Internal Medicine

## 2020-05-20 DIAGNOSIS — C73 Malignant neoplasm of thyroid gland: Secondary | ICD-10-CM

## 2020-05-20 NOTE — Telephone Encounter (Signed)
Pt called to ask if it would be possible for her to go on levothyroxine instead of synthroid?  Callback # [phone] okay to leave a VM if she doesn't pick up

## 2020-05-20 NOTE — Telephone Encounter (Signed)
Absolutely!  Okay to send levothyroxine at the same dose.

## 2020-05-20 NOTE — Telephone Encounter (Signed)
Please advise 

## 2020-05-22 MED ORDER — LEVOTHYROXINE SODIUM 137 MCG PO TABS: 137.0000 ug | ORAL_TABLET | Freq: Every day | ORAL | 1 refills | Status: DC

## 2020-05-22 NOTE — Telephone Encounter (Signed)
Patient called to request a new RX for the following:   MEDICATION: Levothyroxine  PHARMACY:   [id number] - [place], [place] Phone:  [phone]  Fax:  [phone]       HAS THE PATIENT CONTACTED [facility]?  Yes-PHARM states they have not received RX  IS THIS A 90 DAY SUPPLY : Yes  IS PATIENT OUT OF MEDICATION: Yes  IF NOT; HOW MUCH IS LEFT: 0  LAST APPOINTMENT DATE: @2 /23/2022  NEXT APPOINTMENT DATE:@5 /19/2022  DO WE HAVE YOUR PERMISSION TO LEAVE A DETAILED MESSAGE?:Yes  OTHER COMMENTS:    **Let patient know to contact pharmacy at the end of the day to make sure medication is ready. **  ** Please notify patient to allow 48-72 hours to process**  **Encourage patient to contact the pharmacy for refills or they can request refills through [facility]**

## 2020-05-22 NOTE — Addendum Note (Signed)
Addended by: Lauralyn Primes on: 05/22/2020 05:05 PM   Modules accepted: Orders

## 2020-05-22 NOTE — Telephone Encounter (Signed)
Rx sent to preferred pharmacy.

## 2020-05-26 ENCOUNTER — Other Ambulatory Visit: Payer: Medicare HMO

## 2020-06-03 ENCOUNTER — Ambulatory Visit
Admission: RE | Admit: 2020-06-03 | Discharge: 2020-06-03 | Disposition: A | Discharge status: Home / Self Care | Payer: Medicare HMO | Source: Ambulatory Visit | Attending: Internal Medicine | Admitting: Internal Medicine

## 2020-06-03 DIAGNOSIS — R946 Abnormal results of thyroid function studies: Secondary | ICD-10-CM | POA: Diagnosis not present

## 2020-06-03 DIAGNOSIS — C73 Malignant neoplasm of thyroid gland: Secondary | ICD-10-CM

## 2020-06-03 DIAGNOSIS — Z85850 Personal history of malignant neoplasm of thyroid: Secondary | ICD-10-CM | POA: Diagnosis not present

## 2020-06-06 ENCOUNTER — Ambulatory Visit: Payer: Medicare HMO | Admitting: Gastroenterology

## 2020-06-06 ENCOUNTER — Encounter: Payer: Self-pay | Admitting: Gastroenterology

## 2020-06-06 ENCOUNTER — Other Ambulatory Visit: Payer: Self-pay

## 2020-06-06 DIAGNOSIS — K5900 Constipation, unspecified: Secondary | ICD-10-CM | POA: Diagnosis not present

## 2020-06-06 DIAGNOSIS — R142 Eructation: Secondary | ICD-10-CM | POA: Insufficient documentation

## 2020-06-06 MED ORDER — POLYETHYLENE GLYCOL 3350 17 GM/SCOOP PO POWD: ORAL | 1 refills | Status: DC

## 2020-06-06 NOTE — Patient Instructions (Addendum)
1. Start MiraLAX for constipation.  Start off taking 1 capful in 6 ounces of liquid twice daily until you have a couple of soft stools.  Then cut back to once daily until you feel like your bowel movements are more regular like before. 2. Once you feel like your bowel movements are regular, try weaning off of MiraLAX by taking every other day.  If stools continue to be regular, then take MiraLAX only twice per week eventually trying to stop. 3. If your belching, abdominal discomfort and/or constipation return off of medication, would consider further work-up of change in bowels. 4. Call with any problems in the interim.  Return to the office in 3 months.

## 2020-06-06 NOTE — Progress Notes (Signed)
Primary Care Physician: Fayrene Helper, MD  Primary Gastroenterologist:  Garfield Cornea, MD   Chief Complaint  Patient presents with  . Abdominal Pain    Belching, a lot of gas, constipation x 2, only 1 bm each week    HPI: Jennifer Blackburn is a 75 y.o. female here for follow-up of GERD, dysphagia with complaints of abdominal pain and constipation.  Last seen in September 2021.  Gastric ulcer November 2020.  Repeat EGD June 2021 with normal esophagus, previously noted gastric ulcer completely healed.  History of colonic tubular adenoma in 2015.  Colonoscopy repeated in November 2020 with normal exam, no recommendations to repeat due to age.    Presents today with complaints of change in bowels. Usually BMs regular. For past three weeks having infrequent stools. Only two BMs in the past three weeks. Stomach hurts all over, patients thinks maybe because she has been straining to have a BM. Dull. Couple of weeks ago took 2 Dulcolax and had 1 BM.  Last stool was on Tuesday.  Stool was soft.  No melena, brbpr. Nausea. No vomiting. No heartburn. Loud deep malodorous belches new. Noted at onset of bowel issues but this part is better. Medication changes, increased novolog. Restarted ozempic in 03/2020 but previously tolerated last year when able to afford it. Denies any change in diet.   Currently reflux symptoms otherwise well controlled. No heartburn. No dysphagia. No melena, brbpr.   Current Outpatient Medications  Medication Sig Dispense Refill  . acetaminophen (TYLENOL) 500 MG tablet Take 1,000 mg by mouth as needed (for pain.).    Marland Kitchen Cetirizine HCl 10 MG TBDP Take one tablet by mouth two times daily for allergy 60 tablet 5  . Cholecalciferol (VITAMIN D3) 50 MCG (2000 UT) TABS Take 2,000 Units by mouth daily.    . furosemide (LASIX) 20 MG tablet TAKE 1 TABLET BY MOUTH EVERY DAY 90 tablet 1  . gabapentin (NEURONTIN) 100 MG capsule TAKE 1 CAPSULE BY MOUTH 3 TIMES A DAY 270 capsule 1   . glucose blood (ACCU-CHEK GUIDE) test strip 1 each by Other route as needed for other. Use as instructed to check blood sugar 2 times per day dx code E11.65 100 each 3  . hydrOXYzine (ATARAX/VISTARIL) 10 MG tablet TAKE ONE TABLET TWICE DAILY AND TWO TABLETS AT BEDTIME AS NEEDED, FOR GENERALIZED ITCHING 360 tablet 1  . imipramine (TOFRANIL) 25 MG tablet TAKE 4 TABLETS BY MOUTH AT BEDTIME 360 tablet 0  . insulin aspart (NOVOLOG) 100 UNIT/ML FlexPen Inject 6-10 units under the skin 15 min before dinner (Patient taking differently: 3 (three) times daily. Takes 6-10 units 3 times daily.) 15 mL 11  . insulin glargine, 1 Unit Dial, (TOUJEO SOLOSTAR) 300 UNIT/ML Solostar Pen Inject 30-50 units two times daily 10.5 mL 2  . Insulin Pen Needle 32G X 4 MM MISC Use 4x a day 300 each 3  . isosorbide mononitrate (IMDUR) 30 MG 24 hr tablet TAKE HALF A TABLET BY MOUTH EVERY DAY 45 tablet 3  . Lancets (ACCU-CHEK MULTICLIX) lancets Use as instructed three times daily dx 250.01 100 each 5  . levothyroxine (SYNTHROID) 137 MCG tablet Take 1 tablet (137 mcg total) by mouth daily before breakfast. 90 tablet 1  . metFORMIN (GLUCOPHAGE) 1000 MG tablet TAKE 1 TABLET BY MOUTH TWICE A DAY 180 tablet 1  . metoprolol tartrate (LOPRESSOR) 50 MG tablet Take 1 tablet (50 mg total) by mouth 2 (two) times daily. [place]  tablet 3  . OZEMPIC, 0.25 OR 0.5 MG/DOSE, 2 MG/1.5ML SOPN INJECT 0.5 MG INTO THE SKIN ONCE A WEEK. 3 pen 2  . pantoprazole (PROTONIX) 40 MG tablet TAKE 1 TABLET BY MOUTH EVERY DAY 90 tablet 1  . PARoxetine (PAXIL) 20 MG tablet TAKE 1 TABLET BY MOUTH EVERY DAY 90 tablet 1  . potassium chloride SA (KLOR-CON) 20 MEQ tablet Take 1 tablet (20 mEq total) by mouth daily. 30 tablet 3  . quinapril (ACCUPRIL) 10 MG tablet TAKE 1 TABLET BY MOUTH EVERY DAY 90 tablet 1  . Sodium Bicarbonate (NICE PURE BAKING SODA) POWD by Does not apply route as needed.    . verapamil (CALAN-SR) 180 MG CR tablet TAKE 1 TABLET BY MOUTH EVERYDAY AT  BEDTIME 90 tablet 3  . vitamin B-12 (CYANOCOBALAMIN) 1000 MCG tablet Take 1 tablet (1,000 mcg total) by mouth daily. 30 tablet 5   No current facility-administered medications for this visit.    Allergies as of 06/06/2020 - Review Complete 06/06/2020  Allergen Reaction Noted  . Lipitor [atorvastatin calcium] Other (See Comments) 02/07/2019  . Daypro [oxaprozin] Hives 08/23/2016  . Nsaids Hives 04/14/2007  . Sulfonamide derivatives Hives 04/14/2007    ROS:  General: Negative for anorexia, weight loss, fever, chills, fatigue, weakness. ENT: Negative for hoarseness, difficulty swallowing , nasal congestion. CV: Negative for chest pain, angina, palpitations, dyspnea on exertion, peripheral edema.  Respiratory: Negative for dyspnea at rest, dyspnea on exertion, cough, sputum, wheezing.  GI: See history of present illness. GU:  Negative for dysuria, hematuria, urinary incontinence, urinary frequency, nocturnal urination.  Endo: Negative for unusual weight change.    Physical Examination:   BP 127/67   Pulse 67   Temp (!) 96 F (35.6 C) (Temporal)   Ht 5\' 5"  (1.651 m)   Wt 189 lb 6.4 oz (85.9 kg)   LMP 11/11/2016   BMI 31.52 kg/m   General: Well-nourished, well-developed in no acute distress.  Eyes: No icterus. Mouth: masked Lungs: Clear to auscultation bilaterally.  Heart: Regular rate and rhythm, no murmurs rubs or gallops.  Abdomen: Bowel sounds are normal, nondistended, no hepatosplenomegaly or masses, no abdominal bruits or hernia , no rebound or guarding.  Mild lower abdominal tenderness.  Rectus diastases. Extremities: No lower extremity edema. No clubbing or deformities. Neuro: Alert and oriented x 4   Skin: Warm and dry, no jaundice.   Psych: Alert and cooperative, normal mood and affect.  Labs:  Lab Results  Component Value Date   CREATININE 0.81 12/25/2019   BUN 13 12/25/2019   NA 137 12/25/2019   K 4.2 12/25/2019   CL 94 (L) 12/25/2019   CO2 30 12/25/2019    Lab Results  Component Value Date   ALT 18 05/07/2019   AST 17 05/07/2019   ALKPHOS 33 07/13/2016   BILITOT 0.3 05/07/2019   Lab Results  Component Value Date   WBC 7.5 05/07/2019   HGB 12.2 05/07/2019   HCT 38.3 05/07/2019   MCV 84.4 05/07/2019   PLT 283 05/07/2019   Lab Results  Component Value Date   TSH 1.60 05/09/2020    Free T4 1.22  Imaging Studies: US THYROID  Result Date: 06/03/2020 CLINICAL DATA:  History of thyroid carcinoma. Rising thyroglobulin level. EXAM: ULTRASOUND OF HEAD/NECK SOFT TISSUES TECHNIQUE: Ultrasound examination of the head and neck soft tissues was performed in the area of clinical concern. COMPARISON:  Prior ultrasound study on 06/13/2018 and CT studies of the neck on 10/18/2018 and 10/24/2007  FINDINGS: Ultrasound of the neck demonstrates no evidence of visible residual thyroid tissue or abnormal soft tissue mass. Ovoid area of densely shadowing calcification again noted to the right of midline as seen by prior ultrasound and also demonstrated by prior CT studies of the neck with documented stability between 2009 and 2020 studies. Small left cervical lymph node has a similar appearance to the prior ultrasound study measuring 0.5 cm in short axis. IMPRESSION: No evidence by ultrasound of recurrent thyroid malignancy in the neck. Stable right neck calcification which has been documented to be benign and stable by prior CT. Small left cervical lymph node demonstrates stable appearance and size by ultrasound compared to the 2020 ultrasound. Electronically Signed   By: Aletta Edouard M.D.   On: 06/03/2020 13:38   Assessment/Plan:  Pleasant 75 year old female with history of diabetes, hypertension, CAD, thyroid cancer, hypothyroidism, gastric ulcers, GERD presenting for further evaluation of belching, change in bowel habits, constipation for the past 3 weeks.  Denies any recent dietary changes, only medication change was increase in her insulin.  She has had  2 BMs in the past 3 weeks.  Denies any acute illness otherwise.  Query recent ileus, potentially resolving.  Patient describes onset of symptoms including malodorous belching which is improving.  Denies heartburn, nausea/vomiting. We discussed options of empirically treating for constipation versus starting with abdominal x-ray today.  Patient does not want to pursue x-ray at this time.  We will start MiraLAX 1 capful twice daily until couple of soft stools, then back down to once daily.  If she gets good improvement in her bowel function, she can continue MiraLAX for a few weeks and then try weaning off to see if is still needed.  If she develops abdominal discomfort and/or persistent refractory constipation, would consider further work-up.  She will return in 3 months or call sooner if needed.

## 2020-06-07 ENCOUNTER — Encounter: Payer: Self-pay | Admitting: Gastroenterology

## 2020-06-09 NOTE — Progress Notes (Signed)
Cc'ed to pcp °

## 2020-06-16 ENCOUNTER — Ambulatory Visit: Payer: Medicare HMO | Admitting: Gastroenterology

## 2020-06-17 ENCOUNTER — Telehealth: Payer: Self-pay | Admitting: Internal Medicine

## 2020-06-17 DIAGNOSIS — K5900 Constipation, unspecified: Secondary | ICD-10-CM

## 2020-06-17 NOTE — Telephone Encounter (Signed)
Spoke with pt. Pt was notified that she is to d/c Miralax and have Xray at [facility] for further evaluation.

## 2020-06-17 NOTE — Telephone Encounter (Signed)
Please have patient hold miralax for now.   She needs DG Abd 2 view to assess stool load.   If patient continues to have loose stools, then we will do stool studies. But given onset of symptoms were constipation, this would be unusual presentation of infectious etiology. ali

## 2020-06-17 NOTE — Addendum Note (Signed)
Addended by: Mahala Menghini on: 06/17/2020 12:16 PM   Modules accepted: Orders

## 2020-06-17 NOTE — Telephone Encounter (Signed)
PATIENT CALLED AND SAID SHE WAS STILL HAVING PROBLEMS, WOULD LIKE TO SPEAK TO A NURSE, WANTING TO KNOW THE NEXT STEP IN HER CARE

## 2020-06-17 NOTE — Telephone Encounter (Signed)
Spoke with pt. Pt started Miralax on 06/06/2020 as directed. Pt started having several loose stool daily and felt she could'nt stop having bowel movements. After taking the medication for 2 days and constantly having bowel movements, pt reduced her Miralax dosage to once daily. Pt continues to have bowel movements. Pt d/c Miralax on 06/11/2020 for good since she keep having uncontrollable stool in her underwear. Stool is currently soft and pt doesn't feel it come out onto her underwear. Pt tries to push a bowel movement out in the toilet and it doesn't come out at that time. Pt reports lower abdominal pain when trying to get bowels to move. There is some relief after pt has a BM.

## 2020-06-18 ENCOUNTER — Ambulatory Visit (HOSPITAL_COMMUNITY)
Admission: RE | Admit: 2020-06-18 | Discharge: 2020-06-18 | Disposition: A | Discharge status: Home / Self Care | Payer: Medicare HMO | Source: Ambulatory Visit | Attending: Gastroenterology | Admitting: Gastroenterology

## 2020-06-18 ENCOUNTER — Other Ambulatory Visit: Payer: Self-pay

## 2020-06-18 DIAGNOSIS — K5900 Constipation, unspecified: Secondary | ICD-10-CM | POA: Insufficient documentation

## 2020-06-18 DIAGNOSIS — R112 Nausea with vomiting, unspecified: Secondary | ICD-10-CM | POA: Diagnosis not present

## 2020-06-18 DIAGNOSIS — M47816 Spondylosis without myelopathy or radiculopathy, lumbar region: Secondary | ICD-10-CM | POA: Diagnosis not present

## 2020-06-18 DIAGNOSIS — N200 Calculus of kidney: Secondary | ICD-10-CM | POA: Diagnosis not present

## 2020-06-18 DIAGNOSIS — N2889 Other specified disorders of kidney and ureter: Secondary | ICD-10-CM | POA: Diagnosis not present

## 2020-06-19 ENCOUNTER — Other Ambulatory Visit: Payer: Self-pay | Admitting: Family Medicine

## 2020-06-19 DIAGNOSIS — K219 Gastro-esophageal reflux disease without esophagitis: Secondary | ICD-10-CM

## 2020-06-24 ENCOUNTER — Telehealth: Payer: Self-pay

## 2020-06-24 NOTE — Telephone Encounter (Signed)
Returned the pt's call advising ok to drink prune juice and reminded her of her appt with Randall Hiss. Pt stated she didn't receive that message . I advised to check the phone because Erline Levine left date and time on her machine.

## 2020-06-24 NOTE — Telephone Encounter (Signed)
Scheduled her with Randall Hiss this Thursday. Called and left a message on her home phone about the appointment

## 2020-06-24 NOTE — Telephone Encounter (Signed)
OK to take prune juice.   I would like for her to be seen this week to follow up on her bowel concerns.   Erline Levine please make pt appt for this week.

## 2020-06-24 NOTE — Telephone Encounter (Signed)
Spoke with the pt was advised she hasn't had a BM since Friday. (she had done a colon cleanse). States she is having a lot of gas and belching. She took Gas X Sunday but not since then. States when she press on her abdomen it hurts when she is on the toilet all she gets is a lot of uncomfortable gas. Please advise the pt is wanting to drink some prune juice to help. I advised her to wait until she hears back with instructions from [place].

## 2020-06-26 ENCOUNTER — Other Ambulatory Visit: Payer: Self-pay

## 2020-06-26 ENCOUNTER — Ambulatory Visit: Payer: Medicare HMO | Admitting: Nurse Practitioner

## 2020-06-26 ENCOUNTER — Encounter: Payer: Self-pay | Admitting: Nurse Practitioner

## 2020-06-26 VITALS — BP 121/72 | HR 76 | Temp 96.8°F | Ht 65.0 in | Wt 184.6 lb

## 2020-06-26 DIAGNOSIS — R1030 Lower abdominal pain, unspecified: Secondary | ICD-10-CM | POA: Diagnosis not present

## 2020-06-26 DIAGNOSIS — R109 Unspecified abdominal pain: Secondary | ICD-10-CM | POA: Insufficient documentation

## 2020-06-26 DIAGNOSIS — R140 Abdominal distension (gaseous): Secondary | ICD-10-CM | POA: Diagnosis not present

## 2020-06-26 DIAGNOSIS — K5900 Constipation, unspecified: Secondary | ICD-10-CM | POA: Diagnosis not present

## 2020-06-26 DIAGNOSIS — R110 Nausea: Secondary | ICD-10-CM | POA: Insufficient documentation

## 2020-06-26 NOTE — Progress Notes (Signed)
CC'ED TO PCP 

## 2020-06-26 NOTE — Progress Notes (Signed)
Referring Provider: Fayrene Helper, MD Primary Care Physician:  Fayrene Helper, MD Primary GI:  Dr. Gala Romney  Chief Complaint  Patient presents with  . belching  . Gas  . Constipation    Done colon cleanse with mag citrate 4/1. Ate prunes and drank prune juice didn't have bm until last night    HPI:   Jennifer Blackburn is a 75 y.o. female who presents for follow-up.  The patient was last seen in our office 06/06/2020 for constipation and belching.  At that time noted history of GERD, dysphagia, abdominal pain, constipation.  History of gastric ulcer November 2020 and repeat EGD in June 2021 with ulcer completely healed.  History of tubular adenoma with updated colonoscopy November 2020 finding a normal exam and recommended no further colonoscopy due to age.  At her last visit noted change in bowel movements from regular bowel movements to constipation/infrequent stools.  Has only had 2 bowel movements past 3 weeks.  Generalized abdominal pain that she associates with straining, described as dull.  Previously took 2 Dulcolax and had one bowel movement.  Stools soft.  She admits nausea but denies GERD and vomiting.  Notes loud, deep, malodorous belching at onset of bowel issues but this is improving.  GERD symptoms otherwise well managed.  Recommended start MiraLAX twice daily until a couple soft bowel movements then reduce to once daily.  When bowel movements normalize and can try weaning off to as needed.  Follow-up in 3 months.  Called our office 06/17/2020 indicating she began having loose stools daily with MiraLAX that was uncontrollable and subsequently reduced her dosage and eventually stop it on 06/11/2020.  Stools currently soft, having rectal leakage.  Notes lower abdominal discomfort with attempts to have a bowel movement, does find some relief after she does have a successful bowel movement.  Recommended abdominal x-ray, consider stool studies.  Abdominal x-ray completed  06/18/2020 which found changes consistent with constipation. Patient was informed that she had a large amount of stool in her colon with constipation and recommended a mini colon purge with a single bottle of magnesium citrate followed by 3 glasses of water within 1 to 2 hours.  Requested progress for the following week.  Patient called our office 06/24/2020 indicating she completed the colon cleanse.  However, she has now not had a bowel movement in 4 days.  Notes a lot of gas and belching, took Gas-X once but none since.  Requesting the okay to drink prune juice which she was told she could.  Recommended follow-up in office.  Today she states she is doing okay overall. She states she did the colon cleanse last week which worked well and she passed a lot of stool. Did not have any further bowel movements for a few days. She tried prunes which didn't work. She then tried prune juice yesterday and had a good bowel movement, felt like she did empty completely. Still with belching and flatulence. She has also had nausea with her symptoms as well. Her main issues is trying to go to the bathroom and not able to go. Denies vomiting, hematochezia, melena, fever, chills, unintentional weight loss. Denies URI or flu-like symptoms. Denies loss of sense of taste or smell. The patient has received COVID-19 vaccination(s). Denies chest pain, dyspnea, dizziness, lightheadedness, syncope, near syncope. Denies any other upper or lower GI symptoms.  Past Medical History:  Diagnosis Date  . Anxiety   . Arthritis   . CAD (coronary artery  disease)   . Depression   . Diabetes mellitus type II    without complication  . DJD (degenerative joint disease) of lumbar spine   . Hypercholesteremia   . Hyperlipidemia   . Hypertension    benign   . Hypothyroidism   . Thyroid cancer ([place]) 2001    Past Surgical History:  Procedure Laterality Date  . ABDOMINAL HYSTERECTOMY    . CARDIAC CATHETERIZATION    . CATARACT EXTRACTION  W/PHACO Right 07/17/2012   Procedure: CATARACT EXTRACTION PHACO AND INTRAOCULAR LENS PLACEMENT (IOC);  Surgeon: Tonny Branch, MD;  Location: [facility];  Service: Ophthalmology;  Laterality: Right;  CDE:25.51  . CATARACT EXTRACTION W/PHACO Left 09/17/2016   Procedure: CATARACT EXTRACTION PHACO AND INTRAOCULAR LENS PLACEMENT LEFT EYE;  Surgeon: Tonny Branch, MD;  Location: [facility];  Service: Ophthalmology;  Laterality: Left;  CDE: 19.23  . COLONOSCOPY    . COLONOSCOPY N/A 01/15/2014   Procedure: COLONOSCOPY;  Surgeon: Daneil Dolin, MD;  Location: [facility];  Service: Endoscopy;  Laterality: N/A;  9:00 AM  . COLONOSCOPY WITH PROPOFOL N/A 01/25/2019   Procedure: COLONOSCOPY WITH PROPOFOL;  Surgeon: Daneil Dolin, MD; diverticulosis in the entire colon, otherwise normal exam.  No repeat colonoscopy due to age.  . DOPPLER ECHOCARDIOGRAPHY    . ESOPHAGOGASTRODUODENOSCOPY (EGD) WITH PROPOFOL N/A 01/25/2019   Procedure: ESOPHAGOGASTRODUODENOSCOPY (EGD) WITH PROPOFOL;  Surgeon: Daneil Dolin, MD; normal esophagus without dilation due to inability to pass dilator beyond the hypopharynx, 1 small nonbleeding gastric ulcer s/p biopsied, otherwise normal exam.  Pathology with ulcer with reactive changes, no H. pylori, metaplasia, dysplasia, or malignancy.  . ESOPHAGOGASTRODUODENOSCOPY (EGD) WITH PROPOFOL N/A 08/21/2019   Procedure: ESOPHAGOGASTRODUODENOSCOPY (EGD) WITH PROPOFOL;  Surgeon: Daneil Dolin, MD; Normal esophagus. Previously noted gastric ulcer completely healed. Normal examined duodenum.   . JOINT REPLACEMENT  07/01/2010   left hip  . KNEE SURGERY Right    arthroscopy  . left hip replaced  07/01/2010  . SPINE SURGERY  2006   cervical  . stress dipyridamole myocardial perfusion    . THYROIDECTOMY    . TONSILLECTOMY    . VESICOVAGINAL FISTULA CLOSURE W/ TAH      Current Outpatient Medications  Medication Sig Dispense Refill  . acetaminophen (TYLENOL) 500 MG tablet Take 1,000 mg by mouth as  needed (for pain.).    Marland Kitchen Cetirizine HCl 10 MG TBDP Take one tablet by mouth two times daily for allergy 60 tablet 5  . Cholecalciferol (VITAMIN D3) 50 MCG (2000 UT) TABS Take 2,000 Units by mouth daily.    . furosemide (LASIX) 20 MG tablet TAKE 1 TABLET BY MOUTH EVERY DAY 90 tablet 1  . gabapentin (NEURONTIN) 100 MG capsule TAKE 1 CAPSULE BY MOUTH 3 TIMES A DAY 270 capsule 1  . glucose blood (ACCU-CHEK GUIDE) test strip 1 each by Other route as needed for other. Use as instructed to check blood sugar 2 times per day dx code E11.65 100 each 3  . hydrOXYzine (ATARAX/VISTARIL) 10 MG tablet TAKE ONE TABLET TWICE DAILY AND TWO TABLETS AT BEDTIME AS NEEDED, FOR GENERALIZED ITCHING 360 tablet 1  . imipramine (TOFRANIL) 25 MG tablet TAKE 4 TABLETS BY MOUTH AT BEDTIME (Patient taking differently: Take 25-50 mg by mouth at bedtime.) 360 tablet 0  . insulin aspart (NOVOLOG) 100 UNIT/ML FlexPen Inject 6-10 units under the skin 15 min before dinner (Patient taking differently: 3 (three) times daily. Takes 6-10 units 3 times daily.) 15 mL 11  .  insulin glargine, 1 Unit Dial, (TOUJEO SOLOSTAR) 300 UNIT/ML Solostar Pen Inject 30-50 units two times daily 10.5 mL 2  . Insulin Pen Needle 32G X 4 MM MISC Use 4x a day 300 each 3  . isosorbide mononitrate (IMDUR) 30 MG 24 hr tablet TAKE HALF A TABLET BY MOUTH EVERY DAY 45 tablet 3  . Lancets (ACCU-CHEK MULTICLIX) lancets Use as instructed three times daily dx 250.01 100 each 5  . levothyroxine (SYNTHROID) 137 MCG tablet Take 1 tablet (137 mcg total) by mouth daily before breakfast. 90 tablet 1  . metFORMIN (GLUCOPHAGE) 1000 MG tablet TAKE 1 TABLET BY MOUTH TWICE A DAY 180 tablet 1  . metoprolol tartrate (LOPRESSOR) 50 MG tablet Take 1 tablet (50 mg total) by mouth 2 (two) times daily. 180 tablet 3  . OZEMPIC, 0.25 OR 0.5 MG/DOSE, 2 MG/1.5ML SOPN INJECT 0.5 MG INTO THE SKIN ONCE A WEEK. 3 pen 2  . pantoprazole (PROTONIX) 40 MG tablet TAKE 1 TABLET BY MOUTH EVERY DAY 90  tablet 1  . PARoxetine (PAXIL) 20 MG tablet TAKE 1 TABLET BY MOUTH EVERY DAY 90 tablet 1  . potassium chloride SA (KLOR-CON) 20 MEQ tablet Take 1 tablet (20 mEq total) by mouth daily. 30 tablet 3  . quinapril (ACCUPRIL) 10 MG tablet TAKE 1 TABLET BY MOUTH EVERY DAY 90 tablet 1  . verapamil (CALAN-SR) 180 MG CR tablet TAKE 1 TABLET BY MOUTH EVERYDAY AT BEDTIME 90 tablet 3  . vitamin B-12 (CYANOCOBALAMIN) 1000 MCG tablet Take 1 tablet (1,000 mcg total) by mouth daily. 30 tablet 5   No current facility-administered medications for this visit.    Allergies as of 06/26/2020 - Review Complete 06/26/2020  Allergen Reaction Noted  . Lipitor [atorvastatin calcium] Other (See Comments) 02/07/2019  . Daypro [oxaprozin] Hives 08/23/2016  . Nsaids Hives 04/14/2007  . Sulfonamide derivatives Hives 04/14/2007    Family History  Problem Relation Age of Onset  . Heart attack Father   . Heart failure Mother   . Asthma Daughter   . Sleep apnea Son        CPAP  . Colon cancer Neg Hx     Social History   Socioeconomic History  . Marital status: Married    Spouse name: Not on file  . Number of children: 2  . Years of education: 25  . Highest education level: Not on file  Occupational History  . Occupation: retired     Comment: bank  Tobacco Use  . Smoking status: Passive Smoke Exposure - Never Smoker  . Smokeless tobacco: Never Used  . Tobacco comment: Husband smokes in the home  Vaping Use  . Vaping Use: Never used  Substance and Sexual Activity  . Alcohol use: No  . Drug use: No  . Sexual activity: Never  Other Topics Concern  . Not on file  Social History Narrative   Lives with husband, at home   No caffeine   Social Determinants of Health   Financial Resource Strain: Not on file  Food Insecurity: No Food Insecurity  . Worried About Charity fundraiser in the Last Year: Never true  . Ran Out of Food in the Last Year: Never true  Transportation Needs: No Transportation Needs   . Lack of Transportation (Medical): No  . Lack of Transportation (Non-Medical): No  Physical Activity: Insufficiently Active  . Days of Exercise per Week: 4 days  . Minutes of Exercise per Session: 30 min  Stress: No Stress Concern  Present  . Feeling of Stress : Not at all  Social Connections: Socially Integrated  . Frequency of Communication with Friends and Family: More than three times a week  . Frequency of Social Gatherings with Friends and Family: Once a week  . Attends Religious Services: More than 4 times per year  . Active Member of Clubs or Organizations: Yes  . Attends Archivist Meetings: More than 4 times per year  . Marital Status: Married    Subjective: Review of Systems  Constitutional: Negative for chills, fever, malaise/fatigue and weight loss.  HENT: Negative for congestion and sore throat.   Respiratory: Negative for cough and shortness of breath.   Cardiovascular: Negative for chest pain and palpitations.  Gastrointestinal: Positive for abdominal pain, constipation and nausea. Negative for blood in stool, diarrhea, heartburn, melena and vomiting.  Musculoskeletal: Negative for joint pain and myalgias.  Skin: Negative for rash.  Neurological: Negative for dizziness and weakness.  Endo/Heme/Allergies: Does not bruise/bleed easily.  Psychiatric/Behavioral: Negative for depression. The patient is not nervous/anxious.   All other systems reviewed and are negative.    Objective: BP 121/72   Pulse 76   Temp (!) 96.8 F (36 C) (Temporal)   Ht 5\' 5"  (1.651 m)   Wt 184 lb 9.6 oz (83.7 kg)   LMP 11/11/2016   BMI 30.72 kg/m  Physical Exam Vitals and nursing note reviewed.  Constitutional:      General: She is not in acute distress.    Appearance: Normal appearance. She is well-developed. She is obese. She is not ill-appearing, toxic-appearing or diaphoretic.  HENT:     Head: Normocephalic and atraumatic.     Nose: No congestion or rhinorrhea.   Eyes:     General: No scleral icterus. Cardiovascular:     Rate and Rhythm: Normal rate and regular rhythm.     Heart sounds: Normal heart sounds.  Pulmonary:     Effort: Pulmonary effort is normal. No respiratory distress.     Breath sounds: Normal breath sounds.  Abdominal:     General: Bowel sounds are normal.     Palpations: Abdomen is soft. There is no hepatomegaly, splenomegaly or mass.     Tenderness: There is no abdominal tenderness. There is no guarding or rebound.     Hernia: No hernia is present.  Skin:    General: Skin is warm and dry.     Coloration: Skin is not jaundiced.     Findings: No rash.  Neurological:     General: No focal deficit present.     Mental Status: She is alert and oriented to person, place, and time.  Psychiatric:        Attention and Perception: Attention normal.        Mood and Affect: Mood normal.        Speech: Speech normal.        Behavior: Behavior normal.        Thought Content: Thought content normal.        Cognition and Memory: Cognition and memory normal.      Assessment:  Pleasant 75 year old female presents to follow-up on constipation.  She has had quite a significant bout of constipation as outlined in HPI.  Most recently she took a mini bowel purge as recommended by the PA in our office and had good bowel movements.  However, she did not have a bowel movement for 2 to 3 days after then until she tried prune juice which was  somewhat effective.  I feel her constipation is likely beyond the realm of over-the-counter medications for regular, good control.  At this point I discussed multiple options with her and we elected to try Linzess 72 mcg daily.  I have informed her that she could have diarrhea but it typically self resolves in few days.  She is to call us if the diarrhea is severe or prolonged for multiple days.  I will provide samples 1 to 2 weeks and request a progress report regardless in 1 to 2 weeks.  I feel her nausea,  bloating, flatulence are likely all related to her constipation.  We can improve her stools and bowel regularity and I feel her other symptoms will likely improve.   Plan: 1. Linzess 72 mcg daily 2. Samples for 1 to 2 weeks 3. Park for 1 to 2 weeks 4. Call us for any severe prolonged diarrhea with Linzess 5. Follow-up in 2 months    Thank you for allowing [place] to participate in the care of Jennifer Blackburn  Jordann Grime, DNP, AGNP-C Adult & Gerontological Nurse Practitioner [facility]   06/26/2020 1:49 PM   Disclaimer: This note was dictated with voice recognition software. Similar sounding words can inadvertently be transcribed and may not be corrected upon review.

## 2020-06-26 NOTE — Patient Instructions (Signed)
Your health issues we discussed today were:   Constipation with bloating, gas, abdominal discomfort, and nausea: 1. As we discussed, I feel these are all likely related 2. I am going to give you samples of Linzess 72 mcg.  Take this pursing in the morning on an empty stomach 3. As we discussed, you may experience some diarrhea when you start taking Linzess 4. If this happens, it should resolve on its own within a couple days 5. If your diarrhea is prolonged for multiple days or severe/intolerable, stop the medication and call our office 6. Regardless, call us in 1 to 2 weeks and let us know if it is helping your bowel movements 7. Call us for any worsening or severe symptoms  Overall I recommend:  1. Continue other current medications 2. Return for follow-up in 2 months 3. Call us for any questions or concerns   ---------------------------------------------------------------  I am glad you have gotten your COVID-19 vaccination!  Even though you are fully vaccinated you should continue to follow CDC and state/local guidelines.  ---------------------------------------------------------------   At [facility] we value your feedback. You may receive a survey about your visit today. Please share your experience as we strive to create trusting relationships with our patients to provide genuine, compassionate, quality care.  We appreciate your understanding and patience as we review any laboratory studies, imaging, and other diagnostic tests that are ordered as we care for you. Our office policy is 5 business days for review of these results, and any emergent or urgent results are addressed in a timely manner for your best interest. If you do not hear from our office in 1 week, please contact us.   We also encourage the use of MyChart, which contains your medical information for your review as well. If you are not enrolled in this feature, an access code is on this after visit  summary for your convenience. Thank you for allowing [place] to be involved in your care.  It was great to see you today!  I hope you have a great spring!!

## 2020-07-02 ENCOUNTER — Ambulatory Visit: Payer: Medicare HMO | Admitting: Podiatry

## 2020-07-03 ENCOUNTER — Telehealth: Payer: Self-pay

## 2020-07-03 NOTE — Telephone Encounter (Signed)
Yes, please increase to 145 mcg. She can double up on the 72 mcg pills (if she has any left) and/or pick up 3 boxed of samples from our office.  Call with progress report in 1-2 weeks after starting 145 mcg dosing.

## 2020-07-03 NOTE — Telephone Encounter (Signed)
Spoke with pt. Pt was seen on June 26, 2020 and given Linzess 72 mcg. Pt started Linzess 72 mcg on 06/27/2020 and has only had 1 BM. Pt had to drink a small glass of prune juice and it helped her bowels move. Pt would like to know if she needs to increase to the next dosage. It so, can pt pick up samples? Please advise.

## 2020-07-08 ENCOUNTER — Telehealth: Payer: Self-pay | Admitting: Internal Medicine

## 2020-07-08 NOTE — Telephone Encounter (Signed)
Patient last seen by Randall Hiss. Please discuss with him tomorrow for additional recommendations. ?need for CT A/P with contrast due to ongoing issues with constipation. Previous xrays without obstruction. TCS 01/2019.   Let's increase LInzess to 29mcg daily before labeling as ineffective. May need stronger dose. Can give her samples or send in RX for #30, no refills.

## 2020-07-08 NOTE — Telephone Encounter (Signed)
Patient called and said that she changed her medication last Friday but she is still having bathroom issues.  The pills have not improved her symptoms.

## 2020-07-08 NOTE — Telephone Encounter (Signed)
Spoke with pt.  She said that Linzess 145 mcg doesn't seem to be working.  She has her last bm Saturday evening and it was a small one.  She had a lot of belching on Sat and Sun.  She tried drinking prune juice last night but no luck having a bm so far.

## 2020-07-09 ENCOUNTER — Other Ambulatory Visit: Payer: Self-pay | Admitting: Family Medicine

## 2020-07-09 NOTE — Telephone Encounter (Signed)
Spoke to pt.  She was informed of Neil Crouch, PA's recommendations.  She is aware that we have left samples of Linzess 290 mcg up front for her to pick up.  She is aware that Walden Field, NP will review as well.

## 2020-07-10 NOTE — Telephone Encounter (Signed)
Called pt and informed her that Walden Field, NP agrees with Mahala Menghini recommendations.  She was advised to call us back in a week to let us know how it is working for her.  She was informed if it doesn't then we can consider CT or other medications.  Pt voiced understanding.

## 2020-07-10 NOTE — Telephone Encounter (Signed)
Let's see if the 290 mcg dosing helps. If not, we can consider CT or other medications.  Have her give it a good week then call back.

## 2020-07-16 ENCOUNTER — Other Ambulatory Visit: Payer: Self-pay

## 2020-07-16 ENCOUNTER — Encounter: Payer: Self-pay | Admitting: Family Medicine

## 2020-07-16 ENCOUNTER — Ambulatory Visit (INDEPENDENT_AMBULATORY_CARE_PROVIDER_SITE_OTHER): Payer: Medicare HMO | Admitting: Family Medicine

## 2020-07-16 VITALS — BP 120/62 | HR 70 | Temp 97.4°F | Ht 65.0 in | Wt 184.0 lb

## 2020-07-16 DIAGNOSIS — E1159 Type 2 diabetes mellitus with other circulatory complications: Secondary | ICD-10-CM

## 2020-07-16 DIAGNOSIS — K5900 Constipation, unspecified: Secondary | ICD-10-CM

## 2020-07-16 DIAGNOSIS — F321 Major depressive disorder, single episode, moderate: Secondary | ICD-10-CM | POA: Diagnosis not present

## 2020-07-16 DIAGNOSIS — I10 Essential (primary) hypertension: Secondary | ICD-10-CM | POA: Diagnosis not present

## 2020-07-16 DIAGNOSIS — I2510 Atherosclerotic heart disease of native coronary artery without angina pectoris: Secondary | ICD-10-CM | POA: Diagnosis not present

## 2020-07-16 DIAGNOSIS — I2583 Coronary atherosclerosis due to lipid rich plaque: Secondary | ICD-10-CM

## 2020-07-16 DIAGNOSIS — M791 Myalgia, unspecified site: Secondary | ICD-10-CM | POA: Diagnosis not present

## 2020-07-16 DIAGNOSIS — E559 Vitamin D deficiency, unspecified: Secondary | ICD-10-CM | POA: Diagnosis not present

## 2020-07-16 DIAGNOSIS — D649 Anemia, unspecified: Secondary | ICD-10-CM

## 2020-07-16 DIAGNOSIS — Z1321 Encounter for screening for nutritional disorder: Secondary | ICD-10-CM | POA: Diagnosis not present

## 2020-07-16 DIAGNOSIS — G4489 Other headache syndrome: Secondary | ICD-10-CM | POA: Diagnosis not present

## 2020-07-16 DIAGNOSIS — M79661 Pain in right lower leg: Secondary | ICD-10-CM

## 2020-07-16 DIAGNOSIS — E782 Mixed hyperlipidemia: Secondary | ICD-10-CM

## 2020-07-16 NOTE — Patient Instructions (Addendum)
Annual exam  in office early Septemebr, call if you need me sooner, flu vaccine at visit  Please continue to work on blood sugar   CBC, fasting lipid, cmp and EGFr and vit D , B12 and magnesium level level fasting this week  New plan to taper off of furosmide and potassium, start by taking one tablet three times weekly for the next 1 week, then one twice weekly, for the next week , then stop. If you are concerned about leg swelling then you will need to resume furosemide , please call and let me know  It is important that you exercise regularly at least 30 minutes 5 times a week. If you develop chest pain, have severe difficulty breathing, or feel very tired, stop exercising immediately and seek medical attention   Thanks for choosing [facility], we consider it a privelige to serve you.

## 2020-07-18 DIAGNOSIS — E785 Hyperlipidemia, unspecified: Secondary | ICD-10-CM | POA: Diagnosis not present

## 2020-07-18 DIAGNOSIS — Z1321 Encounter for screening for nutritional disorder: Secondary | ICD-10-CM | POA: Diagnosis not present

## 2020-07-18 DIAGNOSIS — E782 Mixed hyperlipidemia: Secondary | ICD-10-CM | POA: Diagnosis not present

## 2020-07-18 DIAGNOSIS — I2510 Atherosclerotic heart disease of native coronary artery without angina pectoris: Secondary | ICD-10-CM | POA: Diagnosis not present

## 2020-07-18 DIAGNOSIS — M791 Myalgia, unspecified site: Secondary | ICD-10-CM | POA: Diagnosis not present

## 2020-07-18 DIAGNOSIS — I2583 Coronary atherosclerosis due to lipid rich plaque: Secondary | ICD-10-CM | POA: Diagnosis not present

## 2020-07-18 DIAGNOSIS — I10 Essential (primary) hypertension: Secondary | ICD-10-CM | POA: Diagnosis not present

## 2020-07-18 DIAGNOSIS — E1159 Type 2 diabetes mellitus with other circulatory complications: Secondary | ICD-10-CM | POA: Diagnosis not present

## 2020-07-18 DIAGNOSIS — E559 Vitamin D deficiency, unspecified: Secondary | ICD-10-CM | POA: Diagnosis not present

## 2020-07-18 DIAGNOSIS — D649 Anemia, unspecified: Secondary | ICD-10-CM | POA: Diagnosis not present

## 2020-07-19 LAB — CMP14+EGFR
ALT: 20 IU/L (ref 0–32)
AST: 27 IU/L (ref 0–40)
Albumin/Globulin Ratio: 1.2 (ref 1.2–2.2)
Albumin: 4.2 g/dL (ref 3.7–4.7)
Alkaline Phosphatase: 50 IU/L (ref 44–121)
BUN/Creatinine Ratio: 20 (ref 12–28)
BUN: 17 mg/dL (ref 8–27)
Bilirubin Total: 0.2 mg/dL (ref 0.0–1.2)
CO2: 26 mmol/L (ref 20–29)
Calcium: 9.4 mg/dL (ref 8.7–10.3)
Chloride: 96 mmol/L (ref 96–106)
Creatinine, Ser: 0.83 mg/dL (ref 0.57–1.00)
Globulin, Total: 3.4 g/dL (ref 1.5–4.5)
Glucose: 186 mg/dL — ABNORMAL HIGH (ref 65–99)
Potassium: 4.8 mmol/L (ref 3.5–5.2)
Sodium: 137 mmol/L (ref 134–144)
Total Protein: 7.6 g/dL (ref 6.0–8.5)
eGFR: 73 mL/min/{1.73_m2} (ref >59)

## 2020-07-19 LAB — CBC WITH DIFFERENTIAL
Basophils Absolute: 0.1 10*3/uL (ref 0.0–0.2)
Basos: 1 %
EOS (ABSOLUTE): 0.4 10*3/uL (ref 0.0–0.4)
Eos: 5 %
Hematocrit: 38.3 % (ref 34.0–46.6)
Hemoglobin: 12.4 g/dL (ref 11.1–15.9)
Immature Grans (Abs): 0 10*3/uL (ref 0.0–0.1)
Immature Granulocytes: 0 %
Lymphocytes Absolute: 3.1 10*3/uL (ref 0.7–3.1)
Lymphs: 41 %
MCH: 26.8 pg (ref 26.6–33.0)
MCHC: 32.4 g/dL (ref 31.5–35.7)
MCV: 83 fL (ref 79–97)
Monocytes Absolute: 0.5 10*3/uL (ref 0.1–0.9)
Monocytes: 7 %
Neutrophils Absolute: 3.5 10*3/uL (ref 1.4–7.0)
Neutrophils: 46 %
RBC: 4.62 x10E6/uL (ref 3.77–5.28)
RDW: 12.5 % (ref 11.7–15.4)
WBC: 7.5 10*3/uL (ref 3.4–10.8)

## 2020-07-19 LAB — LIPID PANEL
Chol/HDL Ratio: 4.5 ratio — ABNORMAL HIGH (ref 0.0–4.4)
Cholesterol, Total: 179 mg/dL (ref 100–199)
HDL: 40 mg/dL (ref >39)
LDL Chol Calc (NIH): 110 mg/dL — ABNORMAL HIGH (ref 0–99)
Triglycerides: 164 mg/dL — ABNORMAL HIGH (ref 0–149)
VLDL Cholesterol Cal: 29 mg/dL (ref 5–40)

## 2020-07-19 LAB — VITAMIN D 25 HYDROXY (VIT D DEFICIENCY, FRACTURES): Vit D, 25-Hydroxy: 38.9 ng/mL (ref 30.0–100.0)

## 2020-07-19 LAB — MAGNESIUM: Magnesium: 1.7 mg/dL (ref 1.6–2.3)

## 2020-07-19 LAB — VITAMIN B12: Vitamin B-12: 869 pg/mL (ref 232–1245)

## 2020-07-20 ENCOUNTER — Encounter: Payer: Self-pay | Admitting: Family Medicine

## 2020-07-20 NOTE — Assessment & Plan Note (Signed)
Controlled, no change in medication  

## 2020-07-20 NOTE — Assessment & Plan Note (Signed)
Daily stool softener and fiber, laxative every 3 days as needed, priune juice limited, 4 oz daily

## 2020-07-20 NOTE — Assessment & Plan Note (Signed)
Controlled, no change in medication DASH diet and commitment to daily physical activity for a minimum of 30 minutes discussed and encouraged, as a part of hypertension management. The importance of attaining a healthy weight is also discussed.  BP/Weight 07/16/2020 06/26/2020 06/06/2020 05/09/2020 04/09/2020 02/26/2020 03/49/1791  Systolic BP 505 697 948 016 553 748 270  Diastolic BP 62 72 67 72 56 60 62  Wt. (Lbs) 184 184.6 189.4 190.6 188 189.12 188  BMI 30.62 30.72 31.52 31.72 31.28 31.47 31.28

## 2020-07-20 NOTE — Assessment & Plan Note (Signed)
Uncontrolled, managed by Endo, encouraged pt to continue to work on this Jennifer Blackburn is reminded of the importance of commitment to daily physical activity for 30 minutes or more, as able and the need to limit carbohydrate intake to 30 to 60 grams per meal to help with blood sugar control.   The need to take medication as prescribed, test blood sugar as directed, and to call between visits if there is a concern that blood sugar is uncontrolled is also discussed.   Jennifer Blackburn is reminded of the importance of daily foot exam, annual eye examination, and good blood sugar, blood pressure and cholesterol control.  Diabetic Labs Latest Ref Rng & Units 07/18/2020 05/09/2020 01/04/2020 12/25/2019 09/04/2019  HbA1c 4.0 - 5.6 % - 10.5(A) 9.7(A) - -  Microalbumin mg/dL - - - - -  Micro/Creat Ratio 0.0 - 30.0 mg/g creat - - - - -  Chol 100 - 199 mg/dL 179 - - - 157  HDL >39 mg/dL 40 - - - 43(L)  Calc LDL 0 - 99 mg/dL 110(H) - - - 91  Triglycerides 0 - 149 mg/dL 164(H) - - - 133  Creatinine 0.57 - 1.00 mg/dL 0.83 - - 0.81 -   BP/Weight 07/16/2020 06/26/2020 06/06/2020 05/09/2020 04/09/2020 02/26/2020 41/74/0814  Systolic BP 481 856 314 970 263 785 885  Diastolic BP 62 72 67 72 56 60 62  Wt. (Lbs) 184 184.6 189.4 190.6 188 189.12 188  BMI 30.62 30.72 31.52 31.72 31.28 31.47 31.28   Foot/eye exam completion dates Latest Ref Rng & Units 10/26/2019 08/29/2019  Eye Exam No Retinopathy Retinopathy(A) -  Foot exam Order - - -  Foot Form Completion - - Done

## 2020-07-20 NOTE — Progress Notes (Signed)
Jennifer Blackburn     MRN: [id number]      DOB: [birth year]   HPI Ms. Auth is here for follow up and re-evaluation of chronic medical conditions, medication management and review of any available recent lab and radiology data.  Preventive health is updated, specifically  Cancer screening and Immunization.   Questions or concerns regarding consultations or procedures which the PT has had in the interim are  addressed. The PT has questions about the need to continue furosemide, she denies any leg swelling or symptoms of cHF Denies polyuria, polydipsia, blurred vision , or hypoglycemic episodes. Blood sugar still high but workin on it ROS Denies recent fever or chills. Denies sinus pressure, nasal congestion, ear pain or sore throat. Denies chest congestion, productive cough or wheezing. Denies chest pains, palpitations and leg swelling Denies abdominal pain, nausea, vomiting,diarrhea .   Denies dysuria, frequency, hesitancy or incontinence. Denies uncontrolled  joint pain, swelling and limitation in mobility. Denies headaches, seizures, numbness, or tingling. Denies depression, anxiety or insomnia. Denies skin break down or rash.   PE  BP 120/62 (BP Location: Right Arm, Patient Position: Sitting, Cuff Size: Normal)   Pulse 70   Temp (!) 97.4 F (36.3 C) (Temporal)   Ht 5\' 5"  (1.651 m)   Wt 184 lb (83.5 kg)   LMP 11/11/2016   SpO2 96%   BMI 30.62 kg/m   Patient alert and oriented and in no cardiopulmonary distress.  HEENT: No facial asymmetry, EOMI,     Neck supple .  Chest: Clear to auscultation bilaterally.  CVS: S1, S2 no murmurs, no S3.Regular rate.  ABD: Soft non tender.   Ext: No edema  MS: Adequate ROM spine, shoulders, hips and knees.  Skin: Intact, no ulcerations or rash noted.  Psych: Good eye contact, normal affect. Memory intact not anxious or depressed appearing.  CNS: CN 2-12 intact, power,  normal throughout.no focal deficits noted.   Assessment  & Plan  Type 2 diabetes mellitus with vascular disease ([place]) Uncontrolled, managed by Endo, encouraged pt to continue to work on this Ms. Lyman is reminded of the importance of commitment to daily physical activity for 30 minutes or more, as able and the need to limit carbohydrate intake to 30 to 60 grams per meal to help with blood sugar control.   The need to take medication as prescribed, test blood sugar as directed, and to call between visits if there is a concern that blood sugar is uncontrolled is also discussed.   Ms. Rettinger is reminded of the importance of daily foot exam, annual eye examination, and good blood sugar, blood pressure and cholesterol control.  Diabetic Labs Latest Ref Rng & Units 07/18/2020 05/09/2020 01/04/2020 12/25/2019 09/04/2019  HbA1c 4.0 - 5.6 % - 10.5(A) 9.7(A) - -  Microalbumin mg/dL - - - - -  Micro/Creat Ratio 0.0 - 30.0 mg/g creat - - - - -  Chol 100 - 199 mg/dL 179 - - - 157  HDL >39 mg/dL 40 - - - 43(L)  Calc LDL 0 - 99 mg/dL 110(H) - - - 91  Triglycerides 0 - 149 mg/dL 164(H) - - - 133  Creatinine 0.57 - 1.00 mg/dL 0.83 - - 0.81 -   BP/Weight 07/16/2020 06/26/2020 06/06/2020 05/09/2020 04/09/2020 02/26/2020 18/84/1660  Systolic BP 630 160 109 323 557 322 025  Diastolic BP 62 72 67 72 56 60 62  Wt. (Lbs) 184 184.6 189.4 190.6 188 189.12 188  BMI 30.62 30.72 31.52 31.72  31.28 31.47 31.28   Foot/eye exam completion dates Latest Ref Rng & Units 10/26/2019 08/29/2019  Eye Exam No Retinopathy Retinopathy(A) -  Foot exam Order - - -  Foot Form Completion - - Done        Hypertension Controlled, no change in medication DASH diet and commitment to daily physical activity for a minimum of 30 minutes discussed and encouraged, as a part of hypertension management. The importance of attaining a healthy weight is also discussed.  BP/Weight 07/16/2020 06/26/2020 06/06/2020 05/09/2020 04/09/2020 02/26/2020 70/96/2836  Systolic BP 629 476 546 503 546 568 127  Diastolic BP  62 72 67 72 56 60 62  Wt. (Lbs) 184 184.6 189.4 190.6 188 189.12 188  BMI 30.62 30.72 31.52 31.72 31.28 31.47 31.28       Depression Controlled, no change in medication   Constipation Daily stool softener and fiber, laxative every 3 days as needed, priune juice limited, 4 oz daily  Headache Controlled, no change in medication '

## 2020-07-21 ENCOUNTER — Other Ambulatory Visit: Payer: Self-pay | Admitting: Family Medicine

## 2020-07-21 MED ORDER — ROSUVASTATIN CALCIUM 5 MG PO TABS: 5.0000 mg | ORAL_TABLET | Freq: Every day | ORAL | 5 refills | Status: DC

## 2020-08-06 ENCOUNTER — Other Ambulatory Visit: Payer: Self-pay

## 2020-08-06 ENCOUNTER — Ambulatory Visit (INDEPENDENT_AMBULATORY_CARE_PROVIDER_SITE_OTHER): Payer: Medicare HMO

## 2020-08-06 DIAGNOSIS — Z0000 Encounter for general adult medical examination without abnormal findings: Secondary | ICD-10-CM

## 2020-08-06 NOTE — Patient Instructions (Signed)
[name] , Thank you for taking time to come for your Medicare Wellness Visit. I appreciate your ongoing commitment to your health goals. Please review the following plan we discussed and let me know if I can assist you in the future.   Screening recommendations/referrals: Colonoscopy: Up to date, next due 01/24/2029 Mammogram: Up to date, next due 04/17/2021 Bone Density: No longer required  Recommended yearly ophthalmology/optometry visit for glaucoma screening and checkup Recommended yearly dental visit for hygiene and checkup  Vaccinations: Influenza vaccine: Up to date, next due fall 2022  Pneumococcal vaccine: Completed series  Tdap vaccine: Up to date, next due 12/20/2020 Shingles vaccine: Currently due, you may receive at your local pharmacy     Advanced directives: Advance directive discussed with you today. Even though you declined this today please call our office should you change your mind and we can give you the proper paperwork for you to fill out.   Conditions/risks identified: None  Next appointment: 08/12/2021 @ 1:40 PM with [place] 65 Years and Older, Female Preventive care refers to lifestyle choices and visits with your health care provider that can promote health and wellness. What does preventive care include?  A yearly physical exam. This is also called an annual well check.  Dental exams once or twice a year.  Routine eye exams. Ask your health care provider how often you should have your eyes checked.  Personal lifestyle choices, including:  Daily care of your teeth and gums.  Regular physical activity.  Eating a healthy diet.  Avoiding tobacco and drug use.  Limiting alcohol use.  Practicing safe sex.  Taking low-dose aspirin every day.  Taking vitamin and mineral supplements as recommended by your health care provider. What happens during an annual well check? The services and screenings done by your  health care provider during your annual well check will depend on your age, overall health, lifestyle risk factors, and family history of disease. Counseling  Your health care provider may ask you questions about your:  Alcohol use.  Tobacco use.  Drug use.  Emotional well-being.  Home and relationship well-being.  Sexual activity.  Eating habits.  History of falls.  Memory and ability to understand (cognition).  Work and work Statistician.  Reproductive health. Screening  You may have the following tests or measurements:  Height, weight, and BMI.  Blood pressure.  Lipid and cholesterol levels. These may be checked every 5 years, or more frequently if you are over 76 years old.  Skin check.  Lung cancer screening. You may have this screening every year starting at age 76 if you have a 30-pack-year history of smoking and currently smoke or have quit within the past 15 years.  Fecal occult blood test (FOBT) of the stool. You may have this test every year starting at age 76.  Flexible sigmoidoscopy or colonoscopy. You may have a sigmoidoscopy every 5 years or a colonoscopy every 10 years starting at age 76.  Hepatitis C blood test.  Hepatitis B blood test.  Sexually transmitted disease (STD) testing.  Diabetes screening. This is done by checking your blood sugar (glucose) after you have not eaten for a while (fasting). You may have this done every 1-3 years.  Bone density scan. This is done to screen for osteoporosis. You may have this done starting at age 76.  Mammogram. This may be done every 1-2 years. Talk to your health care provider about how often you should  have regular mammograms. Talk with your health care provider about your test results, treatment options, and if necessary, the need for more tests. Vaccines  Your health care provider may recommend certain vaccines, such as:  Influenza vaccine. This is recommended every year.  Tetanus, diphtheria, and  acellular pertussis (Tdap, Td) vaccine. You may need a Td booster every 10 years.  Zoster vaccine. You may need this after age 76.  Pneumococcal 13-valent conjugate (PCV13) vaccine. One dose is recommended after age 76.  Pneumococcal polysaccharide (PPSV23) vaccine. One dose is recommended after age 76. Talk to your health care provider about which screenings and vaccines you need and how often you need them. This information is not intended to replace advice given to you by your health care provider. Make sure you discuss any questions you have with your health care provider. Document Released: 04/04/2015 Document Revised: 11/26/2015 Document Reviewed: 01/07/2015 Elsevier Interactive Patient Education  2017 [employer] Prevention in the Home Falls can cause injuries. They can happen to people of all ages. There are many things you can do to make your home safe and to help prevent falls. What can I do on the outside of my home?  Regularly fix the edges of walkways and driveways and fix any cracks.  Remove anything that might make you trip as you walk through a door, such as a raised step or threshold.  Trim any bushes or trees on the path to your home.  Use bright outdoor lighting.  Clear any walking paths of anything that might make someone trip, such as rocks or tools.  Regularly check to see if handrails are loose or broken. Make sure that both sides of any steps have handrails.  Any raised decks and porches should have guardrails on the edges.  Have any leaves, snow, or ice cleared regularly.  Use sand or salt on walking paths during winter.  Clean up any spills in your garage right away. This includes oil or grease spills. What can I do in the bathroom?  Use night lights.  Install grab bars by the toilet and in the tub and shower. Do not use towel bars as grab bars.  Use non-skid mats or decals in the tub or shower.  If you need to sit down in the shower, use  a plastic, non-slip stool.  Keep the floor dry. Clean up any water that spills on the floor as soon as it happens.  Remove soap buildup in the tub or shower regularly.  Attach bath mats securely with double-sided non-slip rug tape.  Do not have throw rugs and other things on the floor that can make you trip. What can I do in the bedroom?  Use night lights.  Make sure that you have a light by your bed that is easy to reach.  Do not use any sheets or blankets that are too big for your bed. They should not hang down onto the floor.  Have a firm chair that has side arms. You can use this for support while you get dressed.  Do not have throw rugs and other things on the floor that can make you trip. What can I do in the kitchen?  Clean up any spills right away.  Avoid walking on wet floors.  Keep items that you use a lot in easy-to-reach places.  If you need to reach something above you, use a strong step stool that has a grab bar.  Keep electrical cords out of  the way.  Do not use floor polish or wax that makes floors slippery. If you must use wax, use non-skid floor wax.  Do not have throw rugs and other things on the floor that can make you trip. What can I do with my stairs?  Do not leave any items on the stairs.  Make sure that there are handrails on both sides of the stairs and use them. Fix handrails that are broken or loose. Make sure that handrails are as long as the stairways.  Check any carpeting to make sure that it is firmly attached to the stairs. Fix any carpet that is loose or worn.  Avoid having throw rugs at the top or bottom of the stairs. If you do have throw rugs, attach them to the floor with carpet tape.  Make sure that you have a light switch at the top of the stairs and the bottom of the stairs. If you do not have them, ask someone to add them for you. What else can I do to help prevent falls?  Wear shoes that:  Do not have high heels.  Have  rubber bottoms.  Are comfortable and fit you well.  Are closed at the toe. Do not wear sandals.  If you use a stepladder:  Make sure that it is fully opened. Do not climb a closed stepladder.  Make sure that both sides of the stepladder are locked into place.  Ask someone to hold it for you, if possible.  Clearly mark and make sure that you can see:  Any grab bars or handrails.  First and last steps.  Where the edge of each step is.  Use tools that help you move around (mobility aids) if they are needed. These include:  Canes.  Walkers.  Scooters.  Crutches.  Turn on the lights when you go into a dark area. Replace any light bulbs as soon as they burn out.  Set up your furniture so you have a clear path. Avoid moving your furniture around.  If any of your floors are uneven, fix them.  If there are any pets around you, be aware of where they are.  Review your medicines with your doctor. Some medicines can make you feel dizzy. This can increase your chance of falling. Ask your doctor what other things that you can do to help prevent falls. This information is not intended to replace advice given to you by your health care provider. Make sure you discuss any questions you have with your health care provider. Document Released: 01/02/2009 Document Revised: 08/14/2015 Document Reviewed: 04/12/2014 Elsevier Interactive Patient Education  2017 [employer].

## 2020-08-06 NOTE — Progress Notes (Signed)
Subjective:   Jennifer Blackburn is a 75 y.o. female who presents for Medicare Annual (Subsequent) preventive examination.  I connected with Jennifer Blackburn today by telephone and verified that I am speaking with the correct person using two identifiers. Location patient: home Location provider: work Persons participating in the virtual visit: patient, provider.   I discussed the limitations, risks, security and privacy concerns of performing an evaluation and management service by telephone and the availability of in person appointments. I also discussed with the patient that there may be a patient responsible charge related to this service. The patient expressed understanding and verbally consented to this telephonic visit.    Interactive audio and video telecommunications were attempted between this provider and patient, however failed, due to patient having technical difficulties OR patient did not have access to video capability.  We continued and completed visit with audio only.      Review of Systems    N/A Cardiac Risk Factors include: advanced age (>22men, >10 women);hypertension;diabetes mellitus;dyslipidemia     Objective:    There were no vitals filed for this visit. There is no height or weight on file to calculate BMI.  Advanced Directives 08/06/2020 02/06/2020 08/21/2019 08/15/2019 01/25/2019 10/12/2017 01/03/2017  Does Patient Have a Medical Advance Directive? No No No No No No No  Would patient like information on creating a medical advance directive? No - Patient declined Yes (MAU/Ambulatory/Procedural Areas - Information given) No - Patient declined No - Patient declined No - Patient declined No - Patient declined Yes (MAU/Ambulatory/Procedural Areas - Information given)  Pre-existing out of facility DNR order (yellow form or pink MOST form) - - - - - - -    Current Medications (verified) Outpatient Encounter Medications as of 08/06/2020  Medication Sig  .  acetaminophen (TYLENOL) 500 MG tablet Take 1,000 mg by mouth as needed (for pain.).  Marland Kitchen Cetirizine HCl 10 MG TBDP Take one tablet by mouth two times daily for allergy  . Cholecalciferol (VITAMIN D3) 50 MCG (2000 UT) TABS Take 2,000 Units by mouth daily.  . furosemide (LASIX) 20 MG tablet TAKE 1 TABLET BY MOUTH EVERY DAY  . gabapentin (NEURONTIN) 100 MG capsule TAKE 1 CAPSULE BY MOUTH 3 TIMES A DAY  . glucose blood (ACCU-CHEK GUIDE) test strip 1 each by Other route as needed for other. Use as instructed to check blood sugar 2 times per day dx code E11.65  . hydrOXYzine (ATARAX/VISTARIL) 10 MG tablet TAKE ONE TABLET TWICE DAILY AND TWO TABLETS AT BEDTIME AS NEEDED, FOR GENERALIZED ITCHING  . imipramine (TOFRANIL) 25 MG tablet TAKE 4 TABLETS BY MOUTH AT BEDTIME (Patient taking differently: Take 25-50 mg by mouth at bedtime.)  . insulin aspart (NOVOLOG) 100 UNIT/ML FlexPen Inject 6-10 units under the skin 15 min before dinner (Patient taking differently: 3 (three) times daily. Takes 6-10 units 3 times daily.)  . insulin glargine, 1 Unit Dial, (TOUJEO SOLOSTAR) 300 UNIT/ML Solostar Pen Inject 30-50 units two times daily  . Insulin Pen Needle 32G X 4 MM MISC Use 4x a day  . isosorbide mononitrate (IMDUR) 30 MG 24 hr tablet TAKE HALF A TABLET BY MOUTH EVERY DAY  . Lancets (ACCU-CHEK MULTICLIX) lancets Use as instructed three times daily dx 250.01  . levothyroxine (SYNTHROID) 137 MCG tablet Take 1 tablet (137 mcg total) by mouth daily before breakfast.  . metFORMIN (GLUCOPHAGE) 1000 MG tablet TAKE 1 TABLET BY MOUTH TWICE A DAY  . metoprolol tartrate (LOPRESSOR) 50 MG tablet Take  1 tablet (50 mg total) by mouth 2 (two) times daily.  Marland Kitchen OZEMPIC, 0.25 OR 0.5 MG/DOSE, 2 MG/1.5ML SOPN INJECT 0.5 MG INTO THE SKIN ONCE A WEEK.  . pantoprazole (PROTONIX) 40 MG tablet TAKE 1 TABLET BY MOUTH EVERY DAY  . PARoxetine (PAXIL) 20 MG tablet TAKE 1 TABLET BY MOUTH EVERY DAY  . potassium chloride SA (KLOR-CON) 20 MEQ tablet  Take 1 tablet (20 mEq total) by mouth daily.  . quinapril (ACCUPRIL) 10 MG tablet TAKE 1 TABLET BY MOUTH EVERY DAY  . verapamil (CALAN-SR) 180 MG CR tablet TAKE 1 TABLET BY MOUTH EVERYDAY AT BEDTIME  . vitamin B-12 (CYANOCOBALAMIN) 1000 MCG tablet Take 1 tablet (1,000 mcg total) by mouth daily.  . pravastatin (PRAVACHOL) 80 MG tablet   . rosuvastatin (CRESTOR) 5 MG tablet Take 1 tablet (5 mg total) by mouth daily. (Patient not taking: Reported on 08/06/2020)   No facility-administered encounter medications on file as of 08/06/2020.    Allergies (verified) Lipitor [atorvastatin calcium], Daypro [oxaprozin], Nsaids, and Sulfonamide derivatives   History: Past Medical History:  Diagnosis Date  . Anxiety   . Arthritis   . CAD (coronary artery disease)   . Depression   . Diabetes mellitus type II    without complication  . DJD (degenerative joint disease) of lumbar spine   . Hypercholesteremia   . Hyperlipidemia   . Hypertension    benign   . Hypothyroidism   . Thyroid cancer ([place]) 2001   Past Surgical History:  Procedure Laterality Date  . ABDOMINAL HYSTERECTOMY    . CARDIAC CATHETERIZATION    . CATARACT EXTRACTION W/PHACO Right 07/17/2012   Procedure: CATARACT EXTRACTION PHACO AND INTRAOCULAR LENS PLACEMENT (IOC);  Surgeon: Tonny Branch, MD;  Location: [facility];  Service: Ophthalmology;  Laterality: Right;  CDE:25.51  . CATARACT EXTRACTION W/PHACO Left 09/17/2016   Procedure: CATARACT EXTRACTION PHACO AND INTRAOCULAR LENS PLACEMENT LEFT EYE;  Surgeon: Tonny Branch, MD;  Location: [facility];  Service: Ophthalmology;  Laterality: Left;  CDE: 19.23  . COLONOSCOPY    . COLONOSCOPY N/A 01/15/2014   Procedure: COLONOSCOPY;  Surgeon: Daneil Dolin, MD;  Location: [facility];  Service: Endoscopy;  Laterality: N/A;  9:00 AM  . COLONOSCOPY WITH PROPOFOL N/A 01/25/2019   Procedure: COLONOSCOPY WITH PROPOFOL;  Surgeon: Daneil Dolin, MD; diverticulosis in the entire colon, otherwise normal exam.   No repeat colonoscopy due to age.  . DOPPLER ECHOCARDIOGRAPHY    . ESOPHAGOGASTRODUODENOSCOPY (EGD) WITH PROPOFOL N/A 01/25/2019   Procedure: ESOPHAGOGASTRODUODENOSCOPY (EGD) WITH PROPOFOL;  Surgeon: Daneil Dolin, MD; normal esophagus without dilation due to inability to pass dilator beyond the hypopharynx, 1 small nonbleeding gastric ulcer s/p biopsied, otherwise normal exam.  Pathology with ulcer with reactive changes, no H. pylori, metaplasia, dysplasia, or malignancy.  . ESOPHAGOGASTRODUODENOSCOPY (EGD) WITH PROPOFOL N/A 08/21/2019   Procedure: ESOPHAGOGASTRODUODENOSCOPY (EGD) WITH PROPOFOL;  Surgeon: Daneil Dolin, MD; Normal esophagus. Previously noted gastric ulcer completely healed. Normal examined duodenum.   . JOINT REPLACEMENT  07/01/2010   left hip  . KNEE SURGERY Right    arthroscopy  . left hip replaced  07/01/2010  . SPINE SURGERY  2006   cervical  . stress dipyridamole myocardial perfusion    . THYROIDECTOMY    . TONSILLECTOMY    . VESICOVAGINAL FISTULA CLOSURE W/ TAH     Family History  Problem Relation Age of Onset  . Heart attack Father   . Heart failure Mother   . Asthma  Daughter   . Sleep apnea Son        CPAP  . Colon cancer Neg Hx    Social History   Socioeconomic History  . Marital status: Married    Spouse name: Not on file  . Number of children: 2  . Years of education: 75  . Highest education level: Not on file  Occupational History  . Occupation: retired     Comment: bank  Tobacco Use  . Smoking status: Passive Smoke Exposure - Never Smoker  . Smokeless tobacco: Never Used  . Tobacco comment: Husband smokes in the home  Vaping Use  . Vaping Use: Never used  Substance and Sexual Activity  . Alcohol use: No  . Drug use: No  . Sexual activity: Never  Other Topics Concern  . Not on file  Social History Narrative   Lives with husband, at home   No caffeine   Social Determinants of Health   Financial Resource Strain: Low Risk   .  Difficulty of Paying Living Expenses: Not hard at all  Food Insecurity: No Food Insecurity  . Worried About Charity fundraiser in the Last Year: Never true  . Ran Out of Food in the Last Year: Never true  Transportation Needs: No Transportation Needs  . Lack of Transportation (Medical): No  . Lack of Transportation (Non-Medical): No  Physical Activity: Insufficiently Active  . Days of Exercise per Week: 3 days  . Minutes of Exercise per Session: 20 min  Stress: No Stress Concern Present  . Feeling of Stress : Not at all  Social Connections: Socially Integrated  . Frequency of Communication with Friends and Family: More than three times a week  . Frequency of Social Gatherings with Friends and Family: More than three times a week  . Attends Religious Services: More than 4 times per year  . Active Member of Clubs or Organizations: Yes  . Attends Archivist Meetings: More than 4 times per year  . Marital Status: Married    Tobacco Counseling Counseling given: Not Answered Comment: Husband smokes in the home   Clinical Intake:  Pre-visit preparation completed: Yes  Pain : No/denies pain     Nutritional Risks: Other (Comment),Nausea/ vomitting/ diarrhea (Constipation) Diabetes: Yes CBG done?: No Did pt. bring in CBG monitor from home?: No  How often do you need to have someone help you when you read instructions, pamphlets, or other written materials from your doctor or pharmacy?: 1 - Never  Diabetic?Yes Nutrition Risk Assessment:  Has the patient had any N/V/D within the last 2 months?  Yes  Does the patient have any non-healing wounds?  No  Has the patient had any unintentional weight loss or weight gain?  No   Diabetes:  Is the patient diabetic?  Yes  If diabetic, was a CBG obtained today?  No  Did the patient bring in their glucometer from home?  No  How often do you monitor your CBG's? Patient states checks glucose twice a day.   Financial Strains and  Diabetes Management:  Are you having any financial strains with the device, your supplies or your medication? Yes .  Does the patient want to be seen by Chronic Care Management for management of their diabetes?  No  Would the patient like to be referred to a Nutritionist or for Diabetic Management?  No   Diabetic Exams:  Diabetic Eye Exam: Completed 10/26/2019 Diabetic Foot Exam: Completed 08/30/2019   Interpreter Needed?: No  Information entered by :: [place] of Daily Living In your present state of health, do you have any difficulty performing the following activities: 08/06/2020 08/15/2019  Hearing? N N  Vision? N N  Difficulty concentrating or making decisions? N N  Walking or climbing stairs? N N  Dressing or bathing? N N  Doing errands, shopping? N N  Preparing Food and eating ? N -  Using the Toilet? N -  In the past six months, have you accidently leaked urine? N -  Do you have problems with loss of bowel control? N -  Managing your Medications? N -  Managing your Finances? N -  Housekeeping or managing your Housekeeping? N -  Some recent data might be hidden    Patient Care Team: Fayrene Helper, MD as PCP - General Gwenlyn Found Pearletha Forge, MD as PCP - Cardiology (Cardiology) Sherlynn Stalls, MD (Ophthalmology) Lorretta Harp, MD as Consulting Physician (Cardiology) Lenard Simmer, MD as Attending Physician (Endocrinology) Gala Romney Cristopher Estimable, MD as Consulting Physician (Gastroenterology) Cassandria Anger, MD as Consulting Physician (Endocrinology)  Indicate any recent Medical Services you may have received from other than [facility] providers in the past year (date may be approximate).     Assessment:   This is a routine wellness examination for Jennifer Blackburn.  Hearing/Vision screen  Hearing Screening   125Hz  250Hz  500Hz  1000Hz  2000Hz  3000Hz  4000Hz  6000Hz  8000Hz   Right ear:           Left ear:           Vision Screening Comments: Patient states  gets eye examined once per year  Dietary issues and exercise activities discussed: Current Exercise Habits: Home exercise routine, Type of exercise: Other - see comments (stationary bike), Time (Minutes): 15, Frequency (Times/Week): 3, Weekly Exercise (Minutes/Week): 45, Intensity: Mild, Exercise limited by: orthopedic condition(s)  Goals Addressed   None    Depression Screen PHQ 2/9 Scores 08/06/2020 07/16/2020 04/07/2020 02/26/2020 12/25/2019 08/29/2019 08/06/2019  PHQ - 2 Score 0 0 0 0 0 0 0  PHQ- 9 Score - - - - 3 6 4     Fall Risk Fall Risk  08/06/2020 07/16/2020 04/07/2020 02/26/2020 11/06/2019  Falls in the past year? 0 0 0 0 0  Number falls in past yr: 0 0 0 - 0  Injury with Fall? 0 0 0 - 0  Comment - - - - -  Risk Factor Category  - - - - -  Risk for fall due to : No Fall Risks No Fall Risks No Fall Risks - -  Risk for fall due to: Comment - - - - -  Follow up Falls evaluation completed;Falls prevention discussed Falls evaluation completed Falls evaluation completed - -  Comment - - - - -    FALL RISK PREVENTION PERTAINING TO THE HOME:  Any stairs in or around the home? Yes  If so, are there any without handrails? No  Home free of loose throw rugs in walkways, pet beds, electrical cords, etc? Yes  Adequate lighting in your home to reduce risk of falls? Yes   ASSISTIVE DEVICES UTILIZED TO PREVENT FALLS:  Life alert? No  Use of a cane, walker or w/c? No  Grab bars in the bathroom? No  Shower chair or bench in shower? Yes  Elevated toilet seat or a handicapped toilet? Yes   Cognitive Function:   Normal cognitive status assessed by direct observation by this Nurse Health Advisor. No abnormalities found.  6CIT Screen 08/06/2019 08/02/2018 01/03/2017  What Year? 0 points 0 points 0 points  What month? 0 points 0 points 0 points  What time? 0 points 0 points 0 points  Count back from 20 0 points 0 points 0 points  Months in reverse 0 points 0 points 0 points  Repeat phrase 0  points 0 points 0 points  Total Score 0 0 0    Immunizations Immunization History  Administered Date(s) Administered  . Fluad Quad(high Dose 65+) 12/18/2018, 12/25/2019  . Influenza Split 03/10/2012  . Influenza Whole 01/04/2007  . Influenza,inj,Quad PF,6+ Mos 11/28/2012, 12/31/2013, 01/31/2017, 01/10/2018  . Influenza,inj,quad, With Preservative 12/20/2016  . Influenza-Unspecified 02/09/2016  . Moderna SARS-COV2 Booster Vaccination 01/24/2020  . Moderna Sars-Covid-2 Vaccination 04/29/2019, 05/30/2019  . Pneumococcal Conjugate-13 07/15/2014  . Pneumococcal Polysaccharide-23 02/17/2004, 09/07/2012  . Tdap 12/21/2010    TDAP status: Up to date  Flu Vaccine status: Up to date  Pneumococcal vaccine status: Completed during today's visit.  Covid-19 vaccine status: Completed vaccines  Qualifies for Shingles Vaccine? Yes   Zostavax completed No   Shingrix Completed?: No.    Education has been provided regarding the importance of this vaccine. Patient has been advised to call insurance company to determine out of pocket expense if they have not yet received this vaccine. Advised may also receive vaccine at local pharmacy or Health Dept. Verbalized acceptance and understanding.  Screening Tests Health Maintenance  Topic Date Due  . COVID-19 Vaccine (4 - Booster for Moderna series) 04/25/2020  . FOOT EXAM  08/29/2020  . INFLUENZA VACCINE  10/20/2020  . OPHTHALMOLOGY EXAM  10/25/2020  . HEMOGLOBIN A1C  11/06/2020  . TETANUS/TDAP  12/20/2020  . COLONOSCOPY (Pts 45-45yrs Insurance coverage will need to be confirmed)  01/24/2029  . DEXA SCAN  Completed  . Hepatitis C Screening  Completed  . PNA vac Low Risk Adult  Completed  . HPV VACCINES  Aged Out    Health Maintenance  Health Maintenance Due  Topic Date Due  . COVID-19 Vaccine (4 - Booster for Moderna series) 04/25/2020    Colorectal cancer screening: Type of screening: Colonoscopy. Completed 01/25/2019. Repeat every 10  years  Mammogram status: Completed 04/17/2020. Repeat every year  Bone Density status: Completed 08/14/2019. Results reflect: Bone density results: NORMAL. Repeat every 0 years.  Lung Cancer Screening: (Low Dose CT Chest recommended if Age 61-80 years, 30 pack-year currently smoking OR have quit w/in 15years.) does not qualify.   Lung Cancer Screening Referral: N/A  Additional Screening:  Hepatitis C Screening: does qualify; Completed 07/13/2016  Vision Screening: Recommended annual ophthalmology exams for early detection of glaucoma and other disorders of the eye. Is the patient up to date with their annual eye exam?  Yes  Who is the provider or what is the name of the office in which the patient attends annual eye exams? Dr. Jorja Loa If pt is not established with a provider, would they like to be referred to a provider to establish care? No .   Dental Screening: Recommended annual dental exams for proper oral hygiene  Community Resource Referral / Chronic Care Management: CRR required this visit?  No   CCM required this visit?  No      Plan:     I have personally reviewed and noted the following in the patient's chart:   . Medical and social history . Use of alcohol, tobacco or illicit drugs  . Current medications and supplements including opioid prescriptions.  . Functional ability  and status . Nutritional status . Physical activity . Advanced directives . List of other physicians . Hospitalizations, surgeries, and ER visits in previous 12 months . Vitals . Screenings to include cognitive, depression, and falls . Referrals and appointments  In addition, I have reviewed and discussed with patient certain preventive protocols, quality metrics, and best practice recommendations. A written personalized care plan for preventive services as well as general preventive health recommendations were provided to patient.     Ofilia Neas, LPN   [id number]   Nurse Notes:  None

## 2020-08-07 ENCOUNTER — Other Ambulatory Visit: Payer: Self-pay

## 2020-08-07 ENCOUNTER — Encounter: Payer: Self-pay | Admitting: Internal Medicine

## 2020-08-07 ENCOUNTER — Ambulatory Visit: Payer: Medicare HMO | Admitting: Internal Medicine

## 2020-08-07 VITALS — BP 120/72 | HR 72 | Ht 65.0 in | Wt 184.6 lb

## 2020-08-07 DIAGNOSIS — E890 Postprocedural hypothyroidism: Secondary | ICD-10-CM | POA: Diagnosis not present

## 2020-08-07 DIAGNOSIS — C73 Malignant neoplasm of thyroid gland: Secondary | ICD-10-CM

## 2020-08-07 DIAGNOSIS — E1159 Type 2 diabetes mellitus with other circulatory complications: Secondary | ICD-10-CM | POA: Diagnosis not present

## 2020-08-07 LAB — POCT GLYCOSYLATED HEMOGLOBIN (HGB A1C): Hemoglobin A1C: 9 % — AB (ref 4.0–5.6)

## 2020-08-07 MED ORDER — OZEMPIC (1 MG/DOSE) 4 MG/3ML ~~LOC~~ SOPN: 1.0000 mg | PEN_INJECTOR | SUBCUTANEOUS | 3 refills | Status: DC

## 2020-08-07 NOTE — Patient Instructions (Addendum)
Please continue: - Metformin 1000 mg 2x a day with meals. - Toujeo 30 units in a.m. and 40 units at bedtime - Novolog to 10-15 units before each meal  Try to increase: - Ozempic 1 mg weekly  Please continue levothyroxine 137 mcg daily.  Take the thyroid hormone every day, with water, at least 30 minutes before breakfast, separated by at least 4 hours from: - acid reflux medications - calcium - iron - multivitamins  Please stop at the lab.  Try Magnesium citrate or oxide 400-500 mg daily at night.  Please return in 4 months with your sugar log.

## 2020-08-07 NOTE — Progress Notes (Signed)
Patient ID: Jennifer Blackburn, female   DOB: 01-27-1946, 75 y.o.   MRN: [id number]   This visit occurred during the SARS-CoV-2 public health emergency.  Safety protocols were in place, including screening questions prior to the visit, additional usage of staff PPE, and extensive cleaning of exam room while observing appropriate contact time as indicated for disinfecting solutions.    HPI: Jennifer Blackburn is a 75 y.o.-year-old female, returning for follow-up for DM2, dx in 2001, insulin-dependent since 2017, uncontrolled, with long-term complications (DR, CAD) and also papillary thyroid cancer and postsurgical hypothyroidism.  Last visit 4 months ago.  Interim history: No increased urination, blurry vision, nausea, but has constipation. Tried prune juice, Miralax, Linzess.  DM2: Reviewed HbA1c levels: Lab Results  Component Value Date   HGBA1C 10.5 (A) 05/09/2020   HGBA1C 9.7 (A) 01/04/2020   HGBA1C 8.4 (A) 08/29/2019   HGBA1C 9.3 (A) 05/03/2019   HGBA1C 8.8 (A) 12/28/2018   HGBA1C 8.9 (A) 09/21/2018   HGBA1C 8.4 (A) 05/26/2018   HGBA1C 8.0 (A) 02/07/2018   HGBA1C 8.9 08/06/2017   HGBA1C 8.0 10/20/2015  01/12/2018: HbA1c 8.7% 08/05/2017: HbA1c 8.9% 07/11/2017: HbA1c 9.1%  Pt is on a regimen of: - Metformin 1000 mg 2x a day with meals. - Toujeo 30 units in a.m. and 50 units at bedtime >> 30 units in a.m. and 40 units at bedtime - Lispro 8-10 >> 10 mg 3x a day 15 min before dinner >> NovoLog 10 to 15 units before each meal - Ozempic 0.5 mg weekly She was on Ozempic 0.5 mg weekly in a.m. >> 1 mg  - constipation >> stopped 06/2018.  We added it back 09/2018. She was on Victoza >> $$$. She was on Jardiance >> $$$. She was on Actos >> swelling - stopped Spring 2019. She was on Glipizide XL 5 mg daily >> stopped 07/2017.  We retried glipizide 5 mg twice a day but had to stop 12/2018 due to lack of effect.  Pt checks her sugars 1-2 times a day: - am:  99, 110, 130-220 >> 99, 189-208, 270  >> 99, 130-180 - 2h after b'fast: n/c - before lunch: n/c - 2h after lunch: n/c >> 265-300s >> n/c >> 200s >> 200s - before dinner: n/c >> 185-190 >> n/c - 2h after dinner: n/c - bedtime: 2 180-210 >> 160-260 >> n/c >> 160-180 - nighttime: n/c Lowest sugar was 110 >> 137 >> 99 >> 99 >> 99 ; she has hypoglycemia awareness in the 60s. Highest sugar was 320 >> 240 >> 260 >> 270 >> 200s.  Glucometer: AccuChek Aviva  Pt's meals are: - Breakfast:toast, cereal, milk; oatmeal; bacon + egg >> oatmeal + coffee - Lunch: skips >> 2 eggs, fruit - Dinner: hamburger, or chicken, or fish, + veggies, bread, water and tea - Snacks: 2- fruit She was seeing a dietitian in [place]. She walks for exercise.  -No CKD, last BUN/creatinine:  Lab Results  Component Value Date   BUN 17 07/18/2020   BUN 13 12/25/2019   CREATININE 0.83 07/18/2020   CREATININE 0.81 12/25/2019  On quinapril.  -+ HL; last set of lipids: Lab Results  Component Value Date   CHOL 179 07/18/2020   HDL 40 07/18/2020   LDLCALC 110 (H) 07/18/2020   TRIG 164 (H) 07/18/2020   CHOLHDL 4.5 (H) 07/18/2020  On pravastatin 80.  - last eye exam was in 10/2019: No DR reportedly. Dr. Eulas Post in [place].  She has cataracts OU.   -  No numbness and tingling in her feet. Has a podiatrist.   On ASA 81.  Pt has no FH of DM.  H/o Thyroid cancer (2001), s/p RAI treatment, now with postsurgical hypothyroidism.  Previously on brand-name Synthroid, now generic levothyroxine.  Pt is on levothyroxine 137 mcg daily, taken: - in am - fasting - at least 30 min from b'fast - no calcium - no iron - no multivitamins - + PPIs (Protonix) moved during the day - not on Biotin  Reviewed her TFTs: Lab Results  Component Value Date   TSH 1.60 05/09/2020   TSH 0.05 (L) 08/31/2019   TSH 0.04 (L) 12/28/2018   TSH 0.06 (L) 09/21/2018   TSH 0.61 05/26/2018   TSH 2.49 03/23/2016   TSH 0.070 (L) 10/13/2010   TSH 0.035 (L) 09/22/2006   TSH  0.015 (L) 06/17/2006  11/03/2017: TSH 0.044  Her thyroglobulin levels are detectable: Lab Results  Component Value Date   THYROGLB 1.2 (L) 05/09/2020   THYROGLB 0.4 (L) 08/31/2019   THYROGLB 0.3 (L) 09/21/2018   THYROGLB 0.7 (L) 05/26/2018   Lab Results  Component Value Date   THGAB <1 05/09/2020   THGAB <1 08/31/2019   THGAB <1 09/21/2018   THGAB <1 05/26/2018   Reviewed records from Dr. Ronnald Collum: Patient is status post total thyroidectomy in 2004 1.3 cm papillary thyroid cancer of the right lobe, followed by 3x RAI tx's with  33 mCi I-131 and a fourth RAI dose inpatient: 125 mCi, at [facility].    Whole-body scan on 09/08/2007 showed an increase in the thyroid bed activity and the follow up study with PET scan 10/08/2007 showed mildly hypermetabolic cervical lymph nodes.    Repeat PET 05/10/2008 showed no significant changes felt likely to be due to to a reactive process.  11/03/2017: TSH 0.044, thyroglobulin 0.2, ATA <1.0  04/05/2017: TSH 0.021 (0.45-4.5), thyroglobulin 0.3 (by IMA), ATA <1.0  Reviewed imaging test reports: Thyroid ultrasound (06/10/2018): 1.1 cm nonspecific calcified lesion in the right upper neck.  CT was recommended: There is no residual or recurrent tissue in the right or left thyroid beds. There is no evidence of abnormal adenopathy by short axis diameter measurement criteria There is a nonspecific calcified soft tissue mass measuring 1.1 cm in the right superior neck. This is of unknown significance. CT neck can be performed to further characterize.  CT (10/18/2018): Status post thyroidectomy. No soft tissue mass within the thyroidectomy bed. A 13 x 7 mm ovoid focus of calcification in the right aspect of the thyroidectomy bed likely corresponding with the finding on recent neck ultrasound and is unchanged as compared to neck CT 10/24/2007, benign.  No pathologically enlarged cervical chain lymph nodes. A nonenlarged calcified right supraclavicular lymph node  is new from prior neck CT 10/24/2007 but also favored benign. Attention recommended on follow-up.  Neck U/S (06/03/2020): Ultrasound of the neck demonstrates no evidence of visible residual thyroid tissue or abnormal soft tissue mass.   Ovoid area of densely shadowing calcification again noted to the right of midline as seen by prior ultrasound and also demonstrated by prior CT studies of the neck with documented stability between 2009 and 2020 studies.   Small left cervical lymph node has a similar appearance to the prior ultrasound study measuring 0.5 cm in short axis.  IMPRESSION: No evidence by ultrasound of recurrent thyroid malignancy in the neck.  Stable right neck calcification which has been documented to be benign and stable by prior CT.  Small left  cervical lymph nodedemonstrates stable appearance and size by ultrasound  compared to the 2020 ultrasound.   Pt denies: - feeling nodules in neck - hoarseness - dysphagia - choking - SOB with lying down  She also has a history of HTN and anemia.  Her daughter lives in [place] and she is sick.  Patient travels between here in [place] frequently.   ROS: Constitutional: no weight gain/no weight loss, + fatigue, no subjective hyperthermia, no subjective hypothermia Eyes: no blurry vision, no xerophthalmia ENT: no sore throat, + see HPI Cardiovascular: no CP/no SOB/no palpitations/no leg swelling Respiratory: no cough/no SOB/no wheezing Gastrointestinal: no N/no V/no D/no C/no acid reflux Musculoskeletal: + muscle aches/+ joint aches Skin: no rashes, no hair loss Neurological: no tremors/no numbness/no tingling/no dizziness  I reviewed pt's medications, allergies, PMH, social hx, family hx, and changes were documented in the history of present illness. Otherwise, unchanged from my initial visit note.  Past Medical History:  Diagnosis Date  . Anxiety   . Arthritis   . CAD (coronary artery disease)   .  Depression   . Diabetes mellitus type II    without complication  . DJD (degenerative joint disease) of lumbar spine   . Hypercholesteremia   . Hyperlipidemia   . Hypertension    benign   . Hypothyroidism   . Thyroid cancer ([place]) 2001   Past Surgical History:  Procedure Laterality Date  . ABDOMINAL HYSTERECTOMY    . CARDIAC CATHETERIZATION    . CATARACT EXTRACTION W/PHACO Right 07/17/2012   Procedure: CATARACT EXTRACTION PHACO AND INTRAOCULAR LENS PLACEMENT (IOC);  Surgeon: Tonny Branch, MD;  Location: [facility];  Service: Ophthalmology;  Laterality: Right;  CDE:25.51  . CATARACT EXTRACTION W/PHACO Left 09/17/2016   Procedure: CATARACT EXTRACTION PHACO AND INTRAOCULAR LENS PLACEMENT LEFT EYE;  Surgeon: Tonny Branch, MD;  Location: [facility];  Service: Ophthalmology;  Laterality: Left;  CDE: 19.23  . COLONOSCOPY    . COLONOSCOPY N/A 01/15/2014   Procedure: COLONOSCOPY;  Surgeon: Daneil Dolin, MD;  Location: [facility] SUITE;  Service: Endoscopy;  Laterality: N/A;  9:00 AM  . COLONOSCOPY WITH PROPOFOL N/A 01/25/2019   Procedure: COLONOSCOPY WITH PROPOFOL;  Surgeon: Daneil Dolin, MD; diverticulosis in the entire colon, otherwise normal exam.  No repeat colonoscopy due to age.  . DOPPLER ECHOCARDIOGRAPHY    . ESOPHAGOGASTRODUODENOSCOPY (EGD) WITH PROPOFOL N/A 01/25/2019   Procedure: ESOPHAGOGASTRODUODENOSCOPY (EGD) WITH PROPOFOL;  Surgeon: Daneil Dolin, MD; normal esophagus without dilation due to inability to pass dilator beyond the hypopharynx, 1 small nonbleeding gastric ulcer s/p biopsied, otherwise normal exam.  Pathology with ulcer with reactive changes, no H. pylori, metaplasia, dysplasia, or malignancy.  . ESOPHAGOGASTRODUODENOSCOPY (EGD) WITH PROPOFOL N/A 08/21/2019   Procedure: ESOPHAGOGASTRODUODENOSCOPY (EGD) WITH PROPOFOL;  Surgeon: Daneil Dolin, MD; Normal esophagus. Previously noted gastric ulcer completely healed. Normal examined duodenum.   . JOINT REPLACEMENT  07/01/2010   left hip   . KNEE SURGERY Right    arthroscopy  . left hip replaced  07/01/2010  . SPINE SURGERY  2006   cervical  . stress dipyridamole myocardial perfusion    . THYROIDECTOMY    . TONSILLECTOMY    . VESICOVAGINAL FISTULA CLOSURE W/ TAH     Social History   Socioeconomic History  . Marital status: Married    Spouse name: Not on file  . Number of children: 2  . Years of education: 64  . Highest education level: Not on file  Occupational History  .  Occupation: retired     Comment: bank  Tobacco Use  . Smoking status: Passive Smoke Exposure - Never Smoker  . Smokeless tobacco: Never Used  . Tobacco comment: Husband smokes in the home  Vaping Use  . Vaping Use: Never used  Substance and Sexual Activity  . Alcohol use: No  . Drug use: No  . Sexual activity: Never  Other Topics Concern  . Not on file  Social History Narrative   Lives with husband, at home   No caffeine   Social Determinants of Health   Financial Resource Strain: Low Risk   . Difficulty of Paying Living Expenses: Not hard at all  Food Insecurity: No Food Insecurity  . Worried About Charity fundraiser in the Last Year: Never true  . Ran Out of Food in the Last Year: Never true  Transportation Needs: No Transportation Needs  . Lack of Transportation (Medical): No  . Lack of Transportation (Non-Medical): No  Physical Activity: Insufficiently Active  . Days of Exercise per Week: 3 days  . Minutes of Exercise per Session: 20 min  Stress: No Stress Concern Present  . Feeling of Stress : Not at all  Social Connections: Socially Integrated  . Frequency of Communication with Friends and Family: More than three times a week  . Frequency of Social Gatherings with Friends and Family: More than three times a week  . Attends Religious Services: More than 4 times per year  . Active Member of Clubs or Organizations: Yes  . Attends Archivist Meetings: More than 4 times per year  . Marital Status: Married   Human resources officer Violence: Not At Risk  . Fear of Current or Ex-Partner: No  . Emotionally Abused: No  . Physically Abused: No  . Sexually Abused: No   Current Outpatient Medications  Medication Sig Dispense Refill  . acetaminophen (TYLENOL) 500 MG tablet Take 1,000 mg by mouth as needed (for pain.).    Marland Kitchen Cetirizine HCl 10 MG TBDP Take one tablet by mouth two times daily for allergy 60 tablet 5  . Cholecalciferol (VITAMIN D3) 50 MCG (2000 UT) TABS Take 2,000 Units by mouth daily.    . furosemide (LASIX) 20 MG tablet TAKE 1 TABLET BY MOUTH EVERY DAY 90 tablet 1  . gabapentin (NEURONTIN) 100 MG capsule TAKE 1 CAPSULE BY MOUTH 3 TIMES A DAY 270 capsule 1  . glucose blood (ACCU-CHEK GUIDE) test strip 1 each by Other route as needed for other. Use as instructed to check blood sugar 2 times per day dx code E11.65 100 each 3  . hydrOXYzine (ATARAX/VISTARIL) 10 MG tablet TAKE ONE TABLET TWICE DAILY AND TWO TABLETS AT BEDTIME AS NEEDED, FOR GENERALIZED ITCHING 360 tablet 1  . imipramine (TOFRANIL) 25 MG tablet TAKE 4 TABLETS BY MOUTH AT BEDTIME (Patient taking differently: Take 25-50 mg by mouth at bedtime.) 360 tablet 0  . insulin aspart (NOVOLOG) 100 UNIT/ML FlexPen Inject 6-10 units under the skin 15 min before dinner (Patient taking differently: 3 (three) times daily. Takes 6-10 units 3 times daily.) 15 mL 11  . insulin glargine, 1 Unit Dial, (TOUJEO SOLOSTAR) 300 UNIT/ML Solostar Pen Inject 30-50 units two times daily 10.5 mL 2  . Insulin Pen Needle 32G X 4 MM MISC Use 4x a day 300 each 3  . isosorbide mononitrate (IMDUR) 30 MG 24 hr tablet TAKE HALF A TABLET BY MOUTH EVERY DAY 45 tablet 3  . Lancets (ACCU-CHEK MULTICLIX) lancets Use as instructed  three times daily dx 250.01 100 each 5  . levothyroxine (SYNTHROID) 137 MCG tablet Take 1 tablet (137 mcg total) by mouth daily before breakfast. 90 tablet 1  . metFORMIN (GLUCOPHAGE) 1000 MG tablet TAKE 1 TABLET BY MOUTH TWICE A DAY 180 tablet 1  .  metoprolol tartrate (LOPRESSOR) 50 MG tablet Take 1 tablet (50 mg total) by mouth 2 (two) times daily. 180 tablet 3  . OZEMPIC, 0.25 OR 0.5 MG/DOSE, 2 MG/1.5ML SOPN INJECT 0.5 MG INTO THE SKIN ONCE A WEEK. 3 pen 2  . pantoprazole (PROTONIX) 40 MG tablet TAKE 1 TABLET BY MOUTH EVERY DAY 90 tablet 1  . PARoxetine (PAXIL) 20 MG tablet TAKE 1 TABLET BY MOUTH EVERY DAY 90 tablet 1  . potassium chloride SA (KLOR-CON) 20 MEQ tablet Take 1 tablet (20 mEq total) by mouth daily. 30 tablet 3  . pravastatin (PRAVACHOL) 80 MG tablet     . quinapril (ACCUPRIL) 10 MG tablet TAKE 1 TABLET BY MOUTH EVERY DAY 90 tablet 1  . rosuvastatin (CRESTOR) 5 MG tablet Take 1 tablet (5 mg total) by mouth daily. 30 tablet 5  . verapamil (CALAN-SR) 180 MG CR tablet TAKE 1 TABLET BY MOUTH EVERYDAY AT BEDTIME 90 tablet 3  . vitamin B-12 (CYANOCOBALAMIN) 1000 MCG tablet Take 1 tablet (1,000 mcg total) by mouth daily. 30 tablet 5   No current facility-administered medications for this visit.     Allergies  Allergen Reactions  . Lipitor [Atorvastatin Calcium] Other (See Comments)    Muscle aches  . Daypro [Oxaprozin] Hives  . Nsaids Hives  . Sulfonamide Derivatives Hives   Family History  Problem Relation Age of Onset  . Heart attack Father   . Heart failure Mother   . Asthma Daughter   . Sleep apnea Son        CPAP  . Colon cancer Neg Hx     PE: BP 120/72 (BP Location: Right Arm, Patient Position: Sitting, Cuff Size: Normal)   Pulse 72   Ht 5\' 5"  (1.651 m)   Wt 184 lb 9.6 oz (83.7 kg)   LMP 11/11/2016   SpO2 97%   BMI 30.72 kg/m  Wt Readings from Last 3 Encounters:  08/07/20 184 lb 9.6 oz (83.7 kg)  07/16/20 184 lb (83.5 kg)  06/26/20 184 lb 9.6 oz (83.7 kg)   Constitutional: overweight, in NAD Eyes: PERRLA, EOMI, no exophthalmos ENT: moist mucous membranes, no thyromegaly, no cervical lymphadenopathy Cardiovascular: RRR, No MRG Respiratory: CTA B Gastrointestinal: abdomen soft, NT, ND,  BS+ Musculoskeletal: no deformities, strength intact in all 4 Skin: moist, warm, no rashes Neurological: no tremor with outstretched hands, DTR normal in all 4  ASSESSMENT: 1. DM2, insulin-dependent, uncontrolled, with long-term complications - CAD - DR  2.  Papillary thyroid cancer  3.  Postsurgical hypothyroidism  PLAN:  1. Patient with longstanding, uncontrolled, type 2 diabetes, on metformin, basal-bolus insulin regimen and also weekly GLP-1 receptor agonist, increased at last visit.  This is now expensive for her, approximately $200 per month as she enters the donut hole.  Given paperwork for patient assistance program for NovoLog. -At last visit, sugars were very high, at all times of the day and we increase the dose of NovoLog and also started Ozempic.   -She is tolerating the 0.5 mg weekly well, except for constipation.  It is unclear whether this is caused by Ozempic, but I explained that Ozempic can definitely contribute.  Discussed about different options for constipation.  She  tried several medications and supplements in the past we discussed about retrying MiraLAX at the lower dose, since the higher dose gave her diarrhea.  I also suggested to try magnesium, not only for constipation but she also has R eye twitching. -Sugars are better in the morning at this visit, but they are still high later in the day, especially before lunch.  We discussed about increasing Ozempic to 1 mg weekly.  I am hoping that she can get it through the patient assistance program.  If we cannot use Ozempic, will need to increase her NovoLog.  For now, I advised him to continue the same doses of NovoLog. - I suggested to:  Patient Instructions  Please continue: - Metformin 1000 mg 2x a day with meals. - Toujeo 30 units in a.m. and 40 units at bedtime - Novolog 10-15 units before each meal  Try to increase: - Ozempic 1 mg weekly  Please continue levothyroxine 137 mcg daily.  Take the thyroid hormone  every day, with water, at least 30 minutes before breakfast, separated by at least 4 hours from: - acid reflux medications - calcium - iron - multivitamins  Please stop at the lab.  Try Magnesium citrate or oxide 400-500 mg daily at night.  Please return in 4 months with your sugar log.   - we checked her HbA1c: 9% (lower) - advised to check sugars at different times of the day - 3x a day, rotating check times - advised for yearly eye exams >> she is UTD - return to clinic in 3-4 months   2.  Papillary thyroid cancer -Previous thyroid cancer records were reviewed from Dr. Ronnald Collum -She has metastatic thyroid cancer and she had 4 RAI treatments.  She also had increased signal on the PET scan from 2009, however, in 2010, another PET scan showed possible inflammatory lymph nodes in the area. -Her thyroglobulin levels are detectable, but stable -At last visit we checked a neck ultrasound and this did not show any new concerning masses.  Previous neck ultrasounds also showed a specific calcified lesion in the right upper neck and we also obtained a neck CT which showed the calcified mass in 2019, most likely benign since it was stable.  -We will repeat her thyroglobulin and ATA antibodies at this visit.  3.  Postsurgical hypothyroidism - latest thyroid labs reviewed with pt >> normal: Lab Results  Component Value Date   TSH 1.60 05/09/2020   - she continues on LT4 137 mcg daily - pt feels good on this dose. - we discussed about taking the thyroid hormone every day, with water, >30 minutes before breakfast, separated by >4 hours from acid reflux medications, calcium, iron, multivitamins. Pt. is taking it correctly.  Orders Placed This Encounter  Procedures  . TSH  . T4, free  . Thyroglobulin antibody  . Thyroglobulin Level  . POCT glycosylated hemoglobin (Hb A1C)   Component     Latest Ref Rng & Units 08/07/2020  Thyroglobulin     ng/mL 0.4 (L)  TSH     0.35 - 4.50 uIU/mL 0.40   T4,Free(Direct)     0.60 - 1.60 ng/dL 1.19  Thyroglobulin Ab     < or = 1 IU/mL <1  Thyroglobulin is back down to her recent baseline.  The rest of the labs are normal.  Philemon Kingdom, MD PhD [facility] Endocrinology

## 2020-08-08 LAB — TSH: TSH: 0.4 u[IU]/mL (ref 0.35–4.50)

## 2020-08-08 LAB — THYROGLOBULIN ANTIBODY: Thyroglobulin Ab: <1 IU/mL (ref <=1)

## 2020-08-08 LAB — THYROGLOBULIN LEVEL: Thyroglobulin: 0.4 ng/mL — ABNORMAL LOW

## 2020-08-08 LAB — T4, FREE: Free T4: 1.19 ng/dL (ref 0.60–1.60)

## 2020-08-17 ENCOUNTER — Other Ambulatory Visit: Payer: Self-pay | Admitting: Family Medicine

## 2020-09-11 ENCOUNTER — Other Ambulatory Visit: Payer: Self-pay | Admitting: Family Medicine

## 2020-09-16 ENCOUNTER — Telehealth: Payer: Self-pay | Admitting: Gastroenterology

## 2020-09-16 ENCOUNTER — Telehealth (INDEPENDENT_AMBULATORY_CARE_PROVIDER_SITE_OTHER): Payer: Medicare HMO | Admitting: Gastroenterology

## 2020-09-16 ENCOUNTER — Telehealth: Payer: Self-pay | Admitting: *Deleted

## 2020-09-16 ENCOUNTER — Encounter: Payer: Self-pay | Admitting: Gastroenterology

## 2020-09-16 ENCOUNTER — Other Ambulatory Visit: Payer: Self-pay

## 2020-09-16 DIAGNOSIS — K219 Gastro-esophageal reflux disease without esophagitis: Secondary | ICD-10-CM | POA: Diagnosis not present

## 2020-09-16 NOTE — Progress Notes (Signed)
Primary Care Physician:  Fayrene Helper, MD Primary GI:  Garfield Cornea, MD   Patient Location: Home  Provider Location: Home  Reason for Phone Visit:  Chief Complaint  Patient presents with   Abdominal Pain    Doing ok     Persons present on the phone encounter, with roles: Patient, myself (provider),Martina Darrick Grinder, LPN(updated meds and allergies)  Total time (minutes) spent on medical discussion: 11 minutes  Due to COVID-19, visit was conducted using telephonic method (no video was available).  Visit was requested by patient.  Virtual Visit via Telephone only  I connected with Ms. Marcantonio on 09/16/20 at 11:30 AM EDT by telephone and verified that I am speaking with the correct person using two identifiers.   I discussed the limitations, risks, security and privacy concerns of performing an evaluation and management service by telephone and the availability of in person appointments. I also discussed with the patient that there may be a patient responsible charge related to this service. The patient expressed understanding and agreed to proceed.   HPI:   Patient is a pleasant 75 y/o female who presents for telephone visit regarding constipation/change in bowels. She also has history of GERD, gastric ulcer in 01/2019 (documented healing on EGD 08/2019). H/O tubular adenoma with updated colonoscopy in 01/2019 with normal exam and no future colonoscopy recommended for screening purposes based on age. Last seen in 06/2020.  Abdominal xray 05/2020 c/w constipation. Patient completed mini purge and took Linzess initially starting at 76mcg daily but ultimately required 220mcg daily to have BM.   Patient states that her BMs finally are returning to normal. She is not having a BM daily but does have one every 2-3 days, soft stool. No longer on Linzess. Appetite is good. No abdominal pain. No melena, brbpr. No heartburn or vomiting. Had been under a lot of stress and wonders if that  contributed to her symptoms. Overall she is very pleased at how she is doing right now. She does wonder about whether she needs to stay on PPI long term. Her last EGD showed ulcer completely healed and no reflux changes. Not having any dysphagia or reflux symptoms at this time. Denies ASA/NSAIDs use.  Current Outpatient Medications  Medication Sig Dispense Refill   acetaminophen (TYLENOL) 500 MG tablet Take 1,000 mg by mouth as needed (for pain.).     Cetirizine HCl 10 MG TBDP Take one tablet by mouth two times daily for allergy 60 tablet 5   Cholecalciferol (VITAMIN D3) 50 MCG (2000 UT) TABS Take 2,000 Units by mouth daily.     furosemide (LASIX) 20 MG tablet TAKE 1 TABLET BY MOUTH EVERY DAY 90 tablet 1   gabapentin (NEURONTIN) 100 MG capsule TAKE 1 CAPSULE BY MOUTH 3 TIMES A DAY 270 capsule 1   glucose blood (ACCU-CHEK GUIDE) test strip 1 each by Other route as needed for other. Use as instructed to check blood sugar 2 times per day dx code E11.65 100 each 3   hydrOXYzine (ATARAX/VISTARIL) 10 MG tablet TAKE ONE TABLET TWICE DAILY AND TWO TABLETS AT BEDTIME AS NEEDED, FOR GENERALIZED ITCHING 360 tablet 1   imipramine (TOFRANIL) 25 MG tablet TAKE 4 TABLETS BY MOUTH AT BEDTIME (Patient taking differently: Take 25-50 mg by mouth at bedtime.) 360 tablet 0   insulin aspart (NOVOLOG) 100 UNIT/ML FlexPen Inject 6-10 units under the skin 15 min before dinner (Patient taking differently: 3 (three) times daily. Takes 6-10 units 3 times daily.)  15 mL 11   insulin glargine, 1 Unit Dial, (TOUJEO SOLOSTAR) 300 UNIT/ML Solostar Pen Inject 30-50 units two times daily 10.5 mL 2   Insulin Pen Needle 32G X 4 MM MISC Use 4x a day 300 each 3   isosorbide mononitrate (IMDUR) 30 MG 24 hr tablet TAKE HALF A TABLET BY MOUTH EVERY DAY 45 tablet 3   Lancets (ACCU-CHEK MULTICLIX) lancets Use as instructed three times daily dx 250.01 100 each 5   levothyroxine (SYNTHROID) 137 MCG tablet Take 1 tablet (137 mcg total) by mouth  daily before breakfast. 90 tablet 1   metFORMIN (GLUCOPHAGE) 1000 MG tablet TAKE 1 TABLET BY MOUTH TWICE A DAY 180 tablet 1   metoprolol tartrate (LOPRESSOR) 50 MG tablet Take 1 tablet (50 mg total) by mouth 2 (two) times daily. 180 tablet 3   pantoprazole (PROTONIX) 40 MG tablet TAKE 1 TABLET BY MOUTH EVERY DAY 90 tablet 1   PARoxetine (PAXIL) 20 MG tablet TAKE 1 TABLET BY MOUTH EVERY DAY 90 tablet 1   potassium chloride SA (KLOR-CON) 20 MEQ tablet Take 1 tablet (20 mEq total) by mouth daily. 30 tablet 3   pravastatin (PRAVACHOL) 80 MG tablet TAKE 1 TABLET BY MOUTH EVERY DAY 90 tablet 1   quinapril (ACCUPRIL) 10 MG tablet TAKE 1 TABLET BY MOUTH EVERY DAY 90 tablet 1   Semaglutide, 1 MG/DOSE, (OZEMPIC, 1 MG/DOSE,) 4 MG/3ML SOPN Inject 1 mg into the skin once a week. 9 mL 3   verapamil (CALAN-SR) 180 MG CR tablet TAKE 1 TABLET BY MOUTH EVERYDAY AT BEDTIME 90 tablet 3   vitamin B-12 (CYANOCOBALAMIN) 1000 MCG tablet Take 1 tablet (1,000 mcg total) by mouth daily. 30 tablet 5   No current facility-administered medications for this visit.    Past Medical History:  Diagnosis Date   Anxiety    Arthritis    CAD (coronary artery disease)    Depression    Diabetes mellitus type II    without complication   DJD (degenerative joint disease) of lumbar spine    Hypercholesteremia    Hyperlipidemia    Hypertension    benign    Hypothyroidism    Thyroid cancer ([place]) 2001    Past Surgical History:  Procedure Laterality Date   ABDOMINAL HYSTERECTOMY     CARDIAC CATHETERIZATION     CATARACT EXTRACTION W/PHACO Right 07/17/2012   Procedure: CATARACT EXTRACTION PHACO AND INTRAOCULAR LENS PLACEMENT ([place]);  Surgeon: Tonny Branch, MD;  Location: [facility];  Service: Ophthalmology;  Laterality: Right;  CDE:25.51   CATARACT EXTRACTION W/PHACO Left 09/17/2016   Procedure: CATARACT EXTRACTION PHACO AND INTRAOCULAR LENS PLACEMENT LEFT EYE;  Surgeon: Tonny Branch, MD;  Location: [facility];  Service: Ophthalmology;   Laterality: Left;  CDE: 19.23   COLONOSCOPY     COLONOSCOPY N/A 01/15/2014   Procedure: COLONOSCOPY;  Surgeon: Daneil Dolin, MD;  [facility];  Service: Endoscopy;  Laterality: N/A;  9:00 AM   COLONOSCOPY WITH PROPOFOL N/A 01/25/2019   Procedure: COLONOSCOPY WITH PROPOFOL;  Surgeon: Daneil Dolin, MD; diverticulosis in the entire colon, otherwise normal exam.  No repeat colonoscopy due to age.   DOPPLER ECHOCARDIOGRAPHY     ESOPHAGOGASTRODUODENOSCOPY (EGD) WITH PROPOFOL N/A 01/25/2019   Procedure: ESOPHAGOGASTRODUODENOSCOPY (EGD) WITH PROPOFOL;  Surgeon: Daneil Dolin, MD; normal esophagus without dilation due to inability to pass dilator beyond the hypopharynx, 1 small nonbleeding gastric ulcer s/p biopsied, otherwise normal exam.  Pathology with ulcer with reactive changes, no H.  pylori, metaplasia, dysplasia, or malignancy.   ESOPHAGOGASTRODUODENOSCOPY (EGD) WITH PROPOFOL N/A 08/21/2019   Procedure: ESOPHAGOGASTRODUODENOSCOPY (EGD) WITH PROPOFOL;  Surgeon: Daneil Dolin, MD; Normal esophagus. Previously noted gastric ulcer completely healed. Normal examined duodenum.    JOINT REPLACEMENT  07/01/2010   left hip   KNEE SURGERY Right    arthroscopy   left hip replaced  07/01/2010   SPINE SURGERY  2006   cervical   stress dipyridamole myocardial perfusion     THYROIDECTOMY     TONSILLECTOMY     VESICOVAGINAL FISTULA CLOSURE W/ TAH      Family History  Problem Relation Age of Onset   Heart attack Father    Heart failure Mother    Asthma Daughter    Sleep apnea Son        CPAP   Colon cancer Neg Hx     Social History   Socioeconomic History   Marital status: Married    Spouse name: Not on file   Number of children: 2   Years of education: 14   Highest education level: Not on file  Occupational History   Occupation: retired     Comment: bank  Tobacco Use   Smoking status: Passive Smoke Exposure - Never Smoker   Smokeless tobacco: Never   Tobacco comments:     Husband smokes in the home  Vaping Use   Vaping Use: Never used  Substance and Sexual Activity   Alcohol use: No   Drug use: No   Sexual activity: Never  Other Topics Concern   Not on file  Social History Narrative   Lives with husband, at home   No caffeine   Social Determinants of Health   Financial Resource Strain: Low Risk    Difficulty of Paying Living Expenses: Not hard at all  Food Insecurity: No Food Insecurity   Worried About Charity fundraiser in the Last Year: Never true   [place] in the Last Year: Never true  Transportation Needs: No Transportation Needs   Lack of Transportation (Medical): No   Lack of Transportation (Non-Medical): No  Physical Activity: Insufficiently Active   Days of Exercise per Week: 3 days   Minutes of Exercise per Session: 20 min  Stress: No Stress Concern Present   Feeling of Stress : Not at all  Social Connections: Socially Integrated   Frequency of Communication with Friends and Family: More than three times a week   Frequency of Social Gatherings with Friends and Family: More than three times a week   Attends Religious Services: More than 4 times per year   Active Member of [employer] or Organizations: Yes   Attends Music therapist: More than 4 times per year   Marital Status: Married  Human resources officer Violence: Not At Risk   Fear of Current or Ex-Partner: No   Emotionally Abused: No   Physically Abused: No   Sexually Abused: No      ROS:  General: Negative for anorexia, weight loss, fever, chills, fatigue, weakness. Eyes: Negative for vision changes.  ENT: Negative for hoarseness, difficulty swallowing , nasal congestion. CV: Negative for chest pain, angina, palpitations, dyspnea on exertion, peripheral edema.  Respiratory: Negative for dyspnea at rest, dyspnea on exertion, cough, sputum, wheezing.  GI: See history of present illness. GU:  Negative for dysuria, hematuria, urinary incontinence, urinary  frequency, nocturnal urination.  MS: Negative for joint pain, low back pain.  Derm: Negative for rash or itching.  Neuro: Negative for weakness, abnormal sensation, seizure, frequent headaches, memory loss, confusion.  Psych: Negative for anxiety, depression, suicidal ideation, hallucinations.  Endo: Negative for unusual weight change.  Heme: Negative for bruising or bleeding. Allergy: Negative for rash or hives.   Observations/Objective: Pleasant cooperative, NAD. Answered questions appropriately.   Assessment and Plan:  Constipation/change in bowels: resolved, nearly back to baseline. No longer requiring Linzess. Encouraged high fiber diet. Continue with adequate liquids. If recurrence of symptoms, could consider CT imaging.   GERD, h/o gastric ulcer: last EGD unremarkable 08/2019. Currently no reflux or dysphagia. Patient would like to try coming off pantoprazole. Will have her wean over the next two weeks and if tolerated she can come off pantoprazole. May use OTC antacids on occasion as needed. She has been given instructions, if recurrent UGI symptoms, occurring 3 or more days a week, then she should resume pantoprazole daily.  Return to the office as needed.  Follow Up Instructions:    I discussed the assessment and treatment plan with the patient. The patient was provided an opportunity to ask questions and all were answered. The patient agreed with the plan and demonstrated an understanding of the instructions. AVS mailed to patient's home address.   The patient was advised to call back or seek an in-person evaluation if the symptoms worsen or if the condition fails to improve as anticipated.  I provided 11 minutes of non-face-to-face time during this encounter.   Neil Crouch, PA-C

## 2020-09-16 NOTE — Progress Notes (Deleted)
Primary Care Physician:  Fayrene Helper, MD Primary GI:  Garfield Cornea, MD    Patient Location: Home  Provider Location: Home  Reason for Visit:  Chief Complaint  Patient presents with   Abdominal Pain    Doing ok     Persons present on the virtual encounter, with roles: Patient, myself (provider),***(updated meds and allergies)  Total time (minutes) spent on medical discussion: ***  Due to COVID-19, visit was conducted using *** method.  Visit was requested by patient.  Virtual Visit via ***  I connected with Jennifer Blackburn on 09/16/20 at 11:30 AM EDT by *** and verified that I am speaking with the correct person using two identifiers.   I discussed the limitations, risks, security and privacy concerns of performing an evaluation and management service by telephone/video and the availability of in person appointments. I also discussed with the patient that there may be a patient responsible charge related to this service. The patient expressed understanding and agreed to proceed.   HPI:   Jennifer Blackburn is a 75 y.o. female who presents for virtual visit regarding ***   Current Outpatient Medications  Medication Sig Dispense Refill   acetaminophen (TYLENOL) 500 MG tablet Take 1,000 mg by mouth as needed (for pain.).     Cetirizine HCl 10 MG TBDP Take one tablet by mouth two times daily for allergy 60 tablet 5   Cholecalciferol (VITAMIN D3) 50 MCG (2000 UT) TABS Take 2,000 Units by mouth daily.     furosemide (LASIX) 20 MG tablet TAKE 1 TABLET BY MOUTH EVERY DAY 90 tablet 1   gabapentin (NEURONTIN) 100 MG capsule TAKE 1 CAPSULE BY MOUTH 3 TIMES A DAY 270 capsule 1   glucose blood (ACCU-CHEK GUIDE) test strip 1 each by Other route as needed for other. Use as instructed to check blood sugar 2 times per day dx code E11.65 100 each 3   hydrOXYzine (ATARAX/VISTARIL) 10 MG tablet TAKE ONE TABLET TWICE DAILY AND TWO TABLETS AT BEDTIME AS NEEDED, FOR GENERALIZED  ITCHING 360 tablet 1   imipramine (TOFRANIL) 25 MG tablet TAKE 4 TABLETS BY MOUTH AT BEDTIME (Patient taking differently: Take 25-50 mg by mouth at bedtime.) 360 tablet 0   insulin aspart (NOVOLOG) 100 UNIT/ML FlexPen Inject 6-10 units under the skin 15 min before dinner (Patient taking differently: 3 (three) times daily. Takes 6-10 units 3 times daily.) 15 mL 11   insulin glargine, 1 Unit Dial, (TOUJEO SOLOSTAR) 300 UNIT/ML Solostar Pen Inject 30-50 units two times daily 10.5 mL 2   Insulin Pen Needle 32G X 4 MM MISC Use 4x a day 300 each 3   isosorbide mononitrate (IMDUR) 30 MG 24 hr tablet TAKE HALF A TABLET BY MOUTH EVERY DAY 45 tablet 3   Lancets (ACCU-CHEK MULTICLIX) lancets Use as instructed three times daily dx 250.01 100 each 5   levothyroxine (SYNTHROID) 137 MCG tablet Take 1 tablet (137 mcg total) by mouth daily before breakfast. 90 tablet 1   metFORMIN (GLUCOPHAGE) 1000 MG tablet TAKE 1 TABLET BY MOUTH TWICE A DAY 180 tablet 1   metoprolol tartrate (LOPRESSOR) 50 MG tablet Take 1 tablet (50 mg total) by mouth 2 (two) times daily. 180 tablet 3   pantoprazole (PROTONIX) 40 MG tablet TAKE 1 TABLET BY MOUTH EVERY DAY 90 tablet 1   PARoxetine (PAXIL) 20 MG tablet TAKE 1 TABLET BY MOUTH EVERY DAY 90 tablet 1   potassium chloride SA (KLOR-CON) 20  MEQ tablet Take 1 tablet (20 mEq total) by mouth daily. 30 tablet 3   pravastatin (PRAVACHOL) 80 MG tablet TAKE 1 TABLET BY MOUTH EVERY DAY 90 tablet 1   quinapril (ACCUPRIL) 10 MG tablet TAKE 1 TABLET BY MOUTH EVERY DAY 90 tablet 1   Semaglutide, 1 MG/DOSE, (OZEMPIC, 1 MG/DOSE,) 4 MG/3ML SOPN Inject 1 mg into the skin once a week. 9 mL 3   verapamil (CALAN-SR) 180 MG CR tablet TAKE 1 TABLET BY MOUTH EVERYDAY AT BEDTIME 90 tablet 3   vitamin B-12 (CYANOCOBALAMIN) 1000 MCG tablet Take 1 tablet (1,000 mcg total) by mouth daily. 30 tablet 5   No current facility-administered medications for this visit.    Past Medical History:  Diagnosis Date    Anxiety    Arthritis    CAD (coronary artery disease)    Depression    Diabetes mellitus type II    without complication   DJD (degenerative joint disease) of lumbar spine    Hypercholesteremia    Hyperlipidemia    Hypertension    benign    Hypothyroidism    [place]yroid cancer ([place]) 2001    Past Surgical History:  Procedure Laterality Date   ABDOMINAL HYSTERECTOMY     CARDIAC CATHETERIZATION     CATARACT EXTRACTION W/PHACO Right 07/17/2012   Procedure: CATARACT EXTRACTION PHACO AND INTRAOCULAR LENS PLACEMENT ([place]);  Surgeon: Tonny Branch, MD;  Location: [facility];  Service: Ophthalmology;  Laterality: Right;  CDE:25.51   CATARACT EXTRACTION W/PHACO Left 09/17/2016   Procedure: CATARACT EXTRACTION PHACO AND INTRAOCULAR LENS PLACEMENT LEFT EYE;  Surgeon: Tonny Branch, MD;  Location: [facility];  Service: Ophthalmology;  Laterality: Left;  CDE: 19.23   COLONOSCOPY     COLONOSCOPY N/A 01/15/2014   Procedure: COLONOSCOPY;  Surgeon: Daneil Dolin, MD;  [facility];  Service: Endoscopy;  Laterality: N/A;  9:00 AM   COLONOSCOPY WITH PROPOFOL N/A 01/25/2019   Procedure: COLONOSCOPY WITH PROPOFOL;  Surgeon: Daneil Dolin, MD; diverticulosis in the entire colon, otherwise normal exam.  No repeat colonoscopy due to age.   DOPPLER ECHOCARDIOGRAPHY     ESOPHAGOGASTRODUODENOSCOPY (EGD) WITH PROPOFOL N/A 01/25/2019   Procedure: ESOPHAGOGASTRODUODENOSCOPY (EGD) WITH PROPOFOL;  Surgeon: Daneil Dolin, MD; normal esophagus without dilation due to inability to pass dilator beyond the hypopharynx, 1 small nonbleeding gastric ulcer s/p biopsied, otherwise normal exam.  Pathology with ulcer with reactive changes, no H. pylori, metaplasia, dysplasia, or malignancy.   ESOPHAGOGASTRODUODENOSCOPY (EGD) WITH PROPOFOL N/A 08/21/2019   Procedure: ESOPHAGOGASTRODUODENOSCOPY (EGD) WITH PROPOFOL;  Surgeon: Daneil Dolin, MD; Normal esophagus. Previously noted gastric ulcer completely healed. Normal examined duodenum.     JOINT REPLACEMENT  07/01/2010   left hip   KNEE SURGERY Right    arthroscopy   left hip replaced  07/01/2010   SPINE SURGERY  2006   cervical   stress dipyridamole myocardial perfusion     THYROIDECTOMY     TONSILLECTOMY     VESICOVAGINAL FISTULA CLOSURE W/ TAH      Family History  Problem Relation Age of Onset   Heart attack Father    Heart failure Mother    Asthma Daughter    Sleep apnea Son        CPAP   Colon cancer Neg Hx     Social History   Socioeconomic History   Marital status: Married    Spouse name: Not on file   Number of children: 2   Years of education: 93  Highest education level: Not on file  Occupational History   Occupation: retired     Comment: bank  Tobacco Use   Smoking status: Passive Smoke Exposure - Never Smoker   Smokeless tobacco: Never   Tobacco comments:    Husband smokes in the home  Vaping Use   Vaping Use: Never used  Substance and Sexual Activity   Alcohol use: No   Drug use: No   Sexual activity: Never  Other Topics Concern   Not on file  Social History Narrative   Lives with husband, at home   No caffeine   Social Determinants of Health   Financial Resource Strain: Low Risk    Difficulty of Paying Living Expenses: Not hard at all  Food Insecurity: No Food Insecurity   Worried About Charity fundraiser in the Last Year: Never true   [place] in the Last Year: Never true  Transportation Needs: No Transportation Needs   Lack of Transportation (Medical): No   Lack of Transportation (Non-Medical): No  Physical Activity: Insufficiently Active   Days of Exercise per Week: 3 days   Minutes of Exercise per Session: 20 min  Stress: No Stress Concern Present   Feeling of Stress : Not at all  Social Connections: Socially Integrated   Frequency of Communication with Friends and Family: More than three times a week   Frequency of Social Gatherings with Friends and Family: More than three times a week   Attends Religious  Services: More than 4 times per year   Active Member of [employer] or Organizations: Yes   Attends Music therapist: More than 4 times per year   Marital Status: Married  Human resources officer Violence: Not At Risk   Fear of Current or Ex-Partner: No   Emotionally Abused: No   Physically Abused: No   Sexually Abused: No      ROS:  General: Negative for anorexia, weight loss, fever, chills, fatigue, weakness. Eyes: Negative for vision changes.  ENT: Negative for hoarseness, difficulty swallowing , nasal congestion. CV: Negative for chest pain, angina, palpitations, dyspnea on exertion, peripheral edema.  Respiratory: Negative for dyspnea at rest, dyspnea on exertion, cough, sputum, wheezing.  GI: See history of present illness. GU:  Negative for dysuria, hematuria, urinary incontinence, urinary frequency, nocturnal urination.  MS: Negative for joint pain, low back pain.  Derm: Negative for rash or itching.  Neuro: Negative for weakness, abnormal sensation, seizure, frequent headaches, memory loss, confusion.  Psych: Negative for anxiety, depression, suicidal ideation, hallucinations.  Endo: Negative for unusual weight change.  Heme: Negative for bruising or bleeding. Allergy: Negative for rash or hives.   Observations/Objective:   Assessment and Plan:   Follow Up Instructions:    I discussed the assessment and treatment plan with the patient. The patient was provided an opportunity to ask questions and all were answered. The patient agreed with the plan and demonstrated an understanding of the instructions. AVS mailed to patient's home address.   The patient was advised to call back or seek an in-person evaluation if the symptoms worsen or if the condition fails to improve as anticipated.  I provided *** minutes of virtual face-to-face time during this encounter.   Neil Crouch, PA-C

## 2020-09-16 NOTE — Telephone Encounter (Signed)
Jennifer Blackburn, you are scheduled for a virtual visit with your provider today.  Just as we do with appointments in the office, we must obtain your consent to participate.  Your consent will be active for this visit and any virtual visit you may have with one of our providers in the next 365 days.  If you have a MyChart account, I can also send a copy of this consent to you electronically.  All virtual visits are billed to your insurance company just like a traditional visit in the office.  As this is a virtual visit, video technology does not allow for your provider to perform a traditional examination.  This may limit your provider's ability to fully assess your condition.  If your provider identifies any concerns that need to be evaluated in person or the need to arrange testing such as labs, EKG, etc, we will make arrangements to do so.  Although advances in technology are sophisticated, we cannot ensure that it will always work on either your end or our end.  If the connection with a video visit is poor, we may have to switch to a telephone visit.  With either a video or telephone visit, we are not always able to ensure that we have a secure connection.   I need to obtain your verbal consent now.   Are you willing to proceed with your visit today?

## 2020-09-16 NOTE — Telephone Encounter (Signed)
Please mail avs

## 2020-09-16 NOTE — Telephone Encounter (Signed)
Pt consented to a virtual visit. 

## 2020-09-16 NOTE — Patient Instructions (Addendum)
You can try weaning off pantoprazole over the next couple of weeks. Start taking every other day the first week. If tolerated, then drop down to taking twice per week the second week. If tolerated, then stop pantoprazole. If you have recurrent heartburn, abdominal pain, or swallowing problems more than 3 days per week, then you will need to go back on pantoprazole daily.  Return to the office as needed.

## 2020-09-17 DIAGNOSIS — L738 Other specified follicular disorders: Secondary | ICD-10-CM | POA: Diagnosis not present

## 2020-09-17 NOTE — Telephone Encounter (Signed)
Mailed AVS.

## 2020-09-23 ENCOUNTER — Other Ambulatory Visit: Payer: Self-pay

## 2020-09-23 ENCOUNTER — Encounter: Payer: Self-pay | Admitting: Family Medicine

## 2020-09-23 ENCOUNTER — Ambulatory Visit (INDEPENDENT_AMBULATORY_CARE_PROVIDER_SITE_OTHER): Payer: Medicare HMO | Admitting: Family Medicine

## 2020-09-23 VITALS — BP 147/70 | HR 78 | Temp 98.7°F | Resp 20 | Ht 65.0 in | Wt 185.0 lb

## 2020-09-23 DIAGNOSIS — G4489 Other headache syndrome: Secondary | ICD-10-CM

## 2020-09-23 DIAGNOSIS — G514 Facial myokymia: Secondary | ICD-10-CM | POA: Diagnosis not present

## 2020-09-23 DIAGNOSIS — H109 Unspecified conjunctivitis: Secondary | ICD-10-CM | POA: Insufficient documentation

## 2020-09-23 DIAGNOSIS — I10 Essential (primary) hypertension: Secondary | ICD-10-CM | POA: Diagnosis not present

## 2020-09-23 DIAGNOSIS — H1031 Unspecified acute conjunctivitis, right eye: Secondary | ICD-10-CM | POA: Diagnosis not present

## 2020-09-23 DIAGNOSIS — E1159 Type 2 diabetes mellitus with other circulatory complications: Secondary | ICD-10-CM | POA: Diagnosis not present

## 2020-09-23 MED ORDER — AZITHROMYCIN 250 MG PO TABS: ORAL_TABLET | ORAL | 0 refills | Status: AC

## 2020-09-23 MED ORDER — IMIPRAMINE HCL 50 MG PO TABS: 50.0000 mg | ORAL_TABLET | Freq: Every day | ORAL | 3 refills | Status: DC

## 2020-09-23 NOTE — Patient Instructions (Addendum)
Keep September appointment as before , call if you need me sooner Z pack prescribed   You are referred to Neurology re facial twitch and uncontrolled headaches, please also mention your recurrent dream  Thanks for choosing [facility], we consider it a privelige to serve you.

## 2020-09-23 NOTE — Assessment & Plan Note (Signed)
Uncontrolled , takes tylenol daily and has new facial twitch and over 4 month h/o recurrent dream of picking nose ,refer Neurology

## 2020-09-23 NOTE — Progress Notes (Signed)
Jennifer Blackburn     MRN: [id number]      DOB: [birth year]   HPI Jennifer Blackburn is here with a 1 month h/o twitching under the right eye 1 week h/o thick drainage from right eye  1 week h/o right maxillary pressure and headache, mainly on right side ,  however a few episodes of left headache yesterdayalso no fever, chills , vision disturbance Several month h/o picking nose, during her sleep, never awakens doing this, no epistaxis or sore in nose  ROS. Denies , ear pain or sore throat. Denies chest congestion, productive cough or wheezing. Denies chest pains, palpitations and leg swelling Denies abdominal pain, nausea, vomiting,diarrhea or constipation.   Denies dysuria, frequency, hesitancy or incontinence. Chronic back pain Denies uncontrolled  headaches, seizures, numbness, or tingling. Denies skin break down or rash.   PE  BP (!) 147/70 (BP Location: Right Arm, Patient Position: Sitting, Cuff Size: Large)   Pulse 78   Temp 98.7 F (37.1 C)   Resp 20   Ht 5\' 5"  (1.651 m)   Wt 185 lb (83.9 kg)   LMP 11/11/2016   SpO2 91%   BMI 30.79 kg/m   Patient alert and oriented and in no cardiopulmonary distress.  HEENT: No facial asymmetry, EOMI,     Neck supple .No visible facial twitch No purulent drainage from eye noted, no erythema  Chest: Clear to auscultation bilaterally.  CVS: S1, S2 no murmurs, no S3.Regular rate.  ABD: Soft non tender.   Ext: No edema  MS: decreased ROM spine, shoulders, hips and knees.  Skin: Intact, no ulcerations or rash noted.  Psych: Good eye contact, normal affect. Memory intact not anxious or depressed appearing.  CNS: CN 2-12 intact, power,  normal throughout.no focal deficits noted.   Assessment & Plan  Headache Uncontrolled , takes tylenol daily and has new facial twitch and over 4 month h/o recurrent dream of picking nose ,refer Neurology  Conjunctivitis Acute onset with drainage and right facial pressure, z pack  prescribed  Hypertension Elevated at this visit, but geerally controlled , no change in medication DASH diet and commitment to daily physical activity for a minimum of 30 minutes discussed and encouraged, as a part of hypertension management. The importance of attaining a healthy weight is also discussed.  BP/Weight 09/23/2020 08/07/2020 08/06/2020 07/16/2020 06/26/2020 06/06/2020 2/75/1700  Systolic BP 174 944 - 967 591 638 466  Diastolic BP 70 72 - 62 72 67 72  Wt. (Lbs) 185 184.6 - 184 184.6 189.4 190.6  BMI 30.79 30.72 - 30.62 30.72 31.52 31.72       Type 2 diabetes mellitus with vascular disease ([place]) [name] is reminded of the importance of commitment to daily physical activity for 30 minutes or more, as able and the need to limit carbohydrate intake to 30 to 60 grams per meal to help with blood sugar control.   The need to take medication as prescribed, test blood sugar as directed, and to call between visits if there is a concern that blood sugar is uncontrolled is also discussed.   [name] is reminded of the importance of daily foot exam, annual eye examination, and good blood sugar, blood pressure and cholesterol control. Uncontrolled but improved, managed by Endo  Diabetic Labs Latest Ref Rng & Units 08/07/2020 07/18/2020 05/09/2020 01/04/2020 12/25/2019  HbA1c 4.0 - 5.6 % 9.0(A) - 10.5(A) 9.7(A) -  Microalbumin mg/dL - - - - -  Micro/Creat Ratio 0.0 - 30.0 mg/g  creat - - - - -  Chol 100 - 199 mg/dL - 179 - - -  HDL >39 mg/dL - 40 - - -  Calc LDL 0 - 99 mg/dL - 110(H) - - -  Triglycerides 0 - 149 mg/dL - 164(H) - - -  Creatinine 0.57 - 1.00 mg/dL - 0.83 - - 0.81   BP/Weight 09/23/2020 08/07/2020 08/06/2020 07/16/2020 06/26/2020 06/06/2020 2/80/0349  Systolic BP 179 150 - 569 794 801 655  Diastolic BP 70 72 - 62 72 67 72  Wt. (Lbs) 185 184.6 - 184 184.6 189.4 190.6  BMI 30.79 30.72 - 30.62 30.72 31.52 31.72   Foot/eye exam completion dates Latest Ref Rng & Units 10/26/2019 08/29/2019   Eye Exam No Retinopathy Retinopathy(A) -  Foot exam Order - - -  Foot Form Completion - - Done

## 2020-09-23 NOTE — Assessment & Plan Note (Addendum)
Elevated at this visit, but geerally controlled , no change in medication DASH diet and commitment to daily physical activity for a minimum of 30 minutes discussed and encouraged, as a part of hypertension management. The importance of attaining a healthy weight is also discussed.  BP/Weight 09/23/2020 08/07/2020 08/06/2020 07/16/2020 06/26/2020 06/06/2020 5/00/9381  Systolic BP 829 937 - 169 678 938 101  Diastolic BP 70 72 - 62 72 67 72  Wt. (Lbs) 185 184.6 - 184 184.6 189.4 190.6  BMI 30.79 30.72 - 30.62 30.72 31.52 31.72

## 2020-09-23 NOTE — Assessment & Plan Note (Signed)
Ms. Cooperman is reminded of the importance of commitment to daily physical activity for 30 minutes or more, as able and the need to limit carbohydrate intake to 30 to 60 grams per meal to help with blood sugar control.   The need to take medication as prescribed, test blood sugar as directed, and to call between visits if there is a concern that blood sugar is uncontrolled is also discussed.   Ms. Vegh is reminded of the importance of daily foot exam, annual eye examination, and good blood sugar, blood pressure and cholesterol control. Uncontrolled but improved, managed by Endo  Diabetic Labs Latest Ref Rng & Units 08/07/2020 07/18/2020 05/09/2020 01/04/2020 12/25/2019  HbA1c 4.0 - 5.6 % 9.0(A) - 10.5(A) 9.7(A) -  Microalbumin mg/dL - - - - -  Micro/Creat Ratio 0.0 - 30.0 mg/g creat - - - - -  Chol 100 - 199 mg/dL - 179 - - -  HDL >39 mg/dL - 40 - - -  Calc LDL 0 - 99 mg/dL - 110(H) - - -  Triglycerides 0 - 149 mg/dL - 164(H) - - -  Creatinine 0.57 - 1.00 mg/dL - 0.83 - - 0.81   BP/Weight 09/23/2020 08/07/2020 08/06/2020 07/16/2020 06/26/2020 06/06/2020 12/16/6392  Systolic BP 320 037 - 944 461 901 222  Diastolic BP 70 72 - 62 72 67 72  Wt. (Lbs) 185 184.6 - 184 184.6 189.4 190.6  BMI 30.79 30.72 - 30.62 30.72 31.52 31.72   Foot/eye exam completion dates Latest Ref Rng & Units 10/26/2019 08/29/2019  Eye Exam No Retinopathy Retinopathy(A) -  Foot exam Order - - -  Foot Form Completion - - Done

## 2020-09-23 NOTE — Assessment & Plan Note (Signed)
Acute onset with drainage and right facial pressure, z pack prescribed

## 2020-10-22 DIAGNOSIS — L680 Hirsutism: Secondary | ICD-10-CM | POA: Diagnosis not present

## 2020-10-28 DIAGNOSIS — E113293 Type 2 diabetes mellitus with mild nonproliferative diabetic retinopathy without macular edema, bilateral: Secondary | ICD-10-CM | POA: Diagnosis not present

## 2020-10-28 LAB — HM DIABETES EYE EXAM

## 2020-10-29 ENCOUNTER — Other Ambulatory Visit: Payer: Self-pay | Admitting: Family Medicine

## 2020-11-03 ENCOUNTER — Other Ambulatory Visit: Payer: Self-pay | Admitting: Internal Medicine

## 2020-11-03 ENCOUNTER — Other Ambulatory Visit: Payer: Self-pay | Admitting: Family Medicine

## 2020-11-03 DIAGNOSIS — C73 Malignant neoplasm of thyroid gland: Secondary | ICD-10-CM

## 2020-11-03 DIAGNOSIS — I10 Essential (primary) hypertension: Secondary | ICD-10-CM

## 2020-11-05 ENCOUNTER — Other Ambulatory Visit: Payer: Self-pay

## 2020-11-05 ENCOUNTER — Ambulatory Visit: Payer: Medicare HMO | Admitting: Diagnostic Neuroimaging

## 2020-11-05 ENCOUNTER — Encounter: Payer: Self-pay | Admitting: Diagnostic Neuroimaging

## 2020-11-05 VITALS — BP 124/66 | HR 64 | Ht 65.0 in | Wt 187.2 lb

## 2020-11-05 DIAGNOSIS — G5139 Clonic hemifacial spasm, unspecified: Secondary | ICD-10-CM | POA: Diagnosis not present

## 2020-11-05 NOTE — Patient Instructions (Signed)
  RIGHT FACIAL PAIN / TWITCHING - monitor sxs; if not improving, then consider MRI brain / IAC - continue aspirin '81mg'$  daily - continue statin and diabetes medications

## 2020-11-05 NOTE — Progress Notes (Signed)
[facility]  PATIENT: Jennifer Blackburn DOB: [birth year]  REFERRING CLINICIAN: Moshe Cipro HISTORY FROM: patient  REASON FOR VISIT: follow up   HISTORICAL  CHIEF COMPLAINT:  Chief Complaint  Patient presents with   Headache    Rm 7, new referral for established pt  "right facial twitching under eye for months- not occurred this week; woke up last week with terrible headache and eye pain, sunshine made it hurt- Ibuprofen helped; I have freq headaches, take Tylenol as needed with relief"    Facial twitching    HISTORY OF PRESENT ILLNESS:   UPDATE (11/05/20, [facility]P): Since last visit, doing well except 1 month ago had right face twitching, pain, numbness. Now slightly better. Had z-pac for sinus issues. Was having more stress and caffeine.  UPDATE 04/12/16: Since last visit, has a few more right facial numbness attacks, (1-2 minutes), then resolved. Triggers include stress. Patient notes that ever since a crown in right upper region (maxillary), since Jan 2017, she has had intermittent right facial numbness and right facial shock sensations. Also TTE was unremarkable. Also sleep study was unremarkable.   UPDATE 12/09/15: Since last visit, had 1 more minor facial event (last week), but not other sxs. TTE and sleep study are pending. Overall doing well.   PRIOR HPI (10/27/15): 75 year old right-handed female with history of diabetes, hypothyroidism, hypertension, hypercholesterolemia, here for evaluation of right face, throat, tongue and hand numbness. 10/07/15 patient noticed sudden onset right face numbness lasting 2-3 minutes at a time intermittently. This went on for 1-2 weeks. July 19 patient also noticed right face and right hand numbness. Patient apparently went to urgent care or emergency room had CT scan and MRI scan as of the brain for the symptoms which ruled out acute stroke. Patient presented here for further evaluation and treatment. Patient previously was taking aspirin  on a daily basis. This was stopped 2-3 years ago.   REVIEW OF SYSTEMS: Full 14 system review of systems performed and negative with exception of: back pain.   ALLERGIES: Allergies  Allergen Reactions   Lipitor [Atorvastatin Calcium] Other (See Comments)    Muscle aches   Daypro [Oxaprozin] Hives   Nsaids Hives   Sulfonamide Derivatives Hives    HOME MEDICATIONS: Outpatient Medications Prior to Visit  Medication Sig Dispense Refill   acetaminophen (TYLENOL) 500 MG tablet Take 1,000 mg by mouth as needed (for pain.).     Cetirizine HCl 10 MG TBDP Take one tablet by mouth two times daily for allergy 60 tablet 5   Cholecalciferol (VITAMIN D3) 50 MCG (2000 UT) TABS Take 2,000 Units by mouth daily.     furosemide (LASIX) 20 MG tablet TAKE 1 TABLET BY MOUTH EVERY DAY 90 tablet 1   gabapentin (NEURONTIN) 100 MG capsule TAKE 1 CAPSULE BY MOUTH 3 TIMES A DAY 270 capsule 1   glucose blood (ACCU-CHEK GUIDE) test strip 1 each by Other route as needed for other. Use as instructed to check blood sugar 2 times per day dx code E11.65 100 each 3   hydrOXYzine (ATARAX/VISTARIL) 10 MG tablet TAKE ONE TABLET TWICE DAILY AND TWO TABLETS AT BEDTIME AS NEEDED, FOR GENERALIZED ITCHING 360 tablet 1   imipramine (TOFRANIL) 50 MG tablet TAKE 1 TABLET BY MOUTH EVERYDAY AT BEDTIME 90 tablet 2   insulin aspart (NOVOLOG) 100 UNIT/ML FlexPen Inject 6-10 units under the skin 15 min before dinner (Patient taking differently: 3 (three) times daily. Takes 6-10 units 3 times daily.) 15 mL 11  insulin glargine, 1 Unit Dial, (TOUJEO SOLOSTAR) 300 UNIT/ML Solostar Pen Inject 30-50 units two times daily 10.5 mL 2   Insulin Pen Needle 32G X 4 MM MISC Use 4x a day 300 each 3   isosorbide mononitrate (IMDUR) 30 MG 24 hr tablet TAKE HALF A TABLET BY MOUTH EVERY DAY 45 tablet 3   Lancets (ACCU-CHEK MULTICLIX) lancets Use as instructed three times daily dx 250.01 100 each 5   levothyroxine (SYNTHROID) 137 MCG tablet TAKE 1 TABLET  BY MOUTH DAILY BEFORE BREAKFAST. 90 tablet 1   metFORMIN (GLUCOPHAGE) 1000 MG tablet TAKE 1 TABLET BY MOUTH TWICE A DAY 180 tablet 1   metoprolol tartrate (LOPRESSOR) 50 MG tablet Take 1 tablet (50 mg total) by mouth 2 (two) times daily. 180 tablet 3   PARoxetine (PAXIL) 20 MG tablet TAKE 1 TABLET BY MOUTH EVERY DAY 90 tablet 1   potassium chloride SA (KLOR-CON) 20 MEQ tablet Take 1 tablet (20 mEq total) by mouth daily. 30 tablet 3   pravastatin (PRAVACHOL) 80 MG tablet TAKE 1 TABLET BY MOUTH EVERY DAY 90 tablet 1   quinapril (ACCUPRIL) 10 MG tablet TAKE 1 TABLET BY MOUTH EVERY DAY 90 tablet 1   Semaglutide, 1 MG/DOSE, (OZEMPIC, 1 MG/DOSE,) 4 MG/3ML SOPN Inject 1 mg into the skin once a week. 9 mL 3   verapamil (CALAN-SR) 180 MG CR tablet TAKE 1 TABLET BY MOUTH EVERYDAY AT BEDTIME 90 tablet 3   vitamin B-12 (CYANOCOBALAMIN) 1000 MCG tablet Take 1 tablet (1,000 mcg total) by mouth daily. 30 tablet 5   pantoprazole (PROTONIX) 40 MG tablet TAKE 1 TABLET BY MOUTH EVERY DAY (Patient not taking: Reported on 11/05/2020) 90 tablet 1   No facility-administered medications prior to visit.    PAST MEDICAL HISTORY: Past Medical History:  Diagnosis Date   Anxiety    Arthritis    CAD (coronary artery disease)    Depression    Diabetes mellitus type II    without complication   DJD (degenerative joint disease) of lumbar spine    Hypercholesteremia    Hyperlipidemia    Hypertension    benign    Hypothyroidism    Thyroid cancer ([place]) 2001    PAST SURGICAL HISTORY: Past Surgical History:  Procedure Laterality Date   ABDOMINAL HYSTERECTOMY     CARDIAC CATHETERIZATION     CATARACT EXTRACTION W/PHACO Right 07/17/2012   Procedure: CATARACT EXTRACTION PHACO AND INTRAOCULAR LENS PLACEMENT ([place]);  Surgeon: Tonny Branch, MD;  Location: [facility];  Service: Ophthalmology;  Laterality: Right;  CDE:25.51   CATARACT EXTRACTION W/PHACO Left 09/17/2016   Procedure: CATARACT EXTRACTION PHACO AND INTRAOCULAR LENS  PLACEMENT LEFT EYE;  Surgeon: Tonny Branch, MD;  Location: [facility];  Service: Ophthalmology;  Laterality: Left;  CDE: 19.23   COLONOSCOPY     COLONOSCOPY N/A 01/15/2014   Procedure: COLONOSCOPY;  Surgeon: Daneil Dolin, MD;  Location: [facility] SUITE;  Service: Endoscopy;  Laterality: N/A;  9:00 AM   COLONOSCOPY WITH PROPOFOL N/A 01/25/2019   Procedure: COLONOSCOPY WITH PROPOFOL;  Surgeon: Daneil Dolin, MD; diverticulosis in the entire colon, otherwise normal exam.  No repeat colonoscopy due to age.   DOPPLER ECHOCARDIOGRAPHY     ESOPHAGOGASTRODUODENOSCOPY (EGD) WITH PROPOFOL N/A 01/25/2019   Procedure: ESOPHAGOGASTRODUODENOSCOPY (EGD) WITH PROPOFOL;  Surgeon: Daneil Dolin, MD; normal esophagus without dilation due to inability to pass dilator beyond the hypopharynx, 1 small nonbleeding gastric ulcer s/p biopsied, otherwise normal exam.  Pathology with ulcer with reactive  changes, no H. pylori, metaplasia, dysplasia, or malignancy.   ESOPHAGOGASTRODUODENOSCOPY (EGD) WITH PROPOFOL N/A 08/21/2019   Procedure: ESOPHAGOGASTRODUODENOSCOPY (EGD) WITH PROPOFOL;  Surgeon: Daneil Dolin, MD; Normal esophagus. Previously noted gastric ulcer completely healed. Normal examined duodenum.    JOINT REPLACEMENT  07/01/2010   left hip   KNEE SURGERY Right    arthroscopy   left hip replaced  07/01/2010   SPINE SURGERY  2006   cervical   stress dipyridamole myocardial perfusion     THYROIDECTOMY     TONSILLECTOMY     VESICOVAGINAL FISTULA CLOSURE W/ TAH      FAMILY HISTORY: Family History  Problem Relation Age of Onset   Heart attack Father    Heart failure Mother    Asthma Daughter    Sleep apnea Son        CPAP   Colon cancer Neg Hx     SOCIAL HISTORY:  Social History   Socioeconomic History   Marital status: Married    Spouse name: Not on file   Number of children: 2   Years of education: 14   Highest education level: Not on file  Occupational History   Occupation: retired     Comment:  bank  Tobacco Use   Smoking status: Never    Passive exposure: Yes   Smokeless tobacco: Never   Tobacco comments:    Husband smokes in the home  Vaping Use   Vaping Use: Never used  Substance and Sexual Activity   Alcohol use: No   Drug use: No   Sexual activity: Never  Other Topics Concern   Not on file  Social History Narrative   Lives with husband, at home   No caffeine   Social Determinants of Health   Financial Resource Strain: Low Risk    Difficulty of Paying Living Expenses: Not hard at all  Food Insecurity: No Food Insecurity   Worried About Charity fundraiser in the Last Year: Never true   [place] in the Last Year: Never true  Transportation Needs: No Transportation Needs   Lack of Transportation (Medical): No   Lack of Transportation (Non-Medical): No  Physical Activity: Insufficiently Active   Days of Exercise per Week: 3 days   Minutes of Exercise per Session: 20 min  Stress: No Stress Concern Present   Feeling of Stress : Not at all  Social Connections: Socially Integrated   Frequency of Communication with Friends and Family: More than three times a week   Frequency of Social Gatherings with Friends and Family: More than three times a week   Attends Religious Services: More than 4 times per year   Active Member of [employer] or Organizations: Yes   Attends Music therapist: More than 4 times per year   Marital Status: Married  Human resources officer Violence: Not At Risk   Fear of Current or Ex-Partner: No   Emotionally Abused: No   Physically Abused: No   Sexually Abused: No     PHYSICAL EXAM  GENERAL EXAM/CONSTITUTIONAL: Vitals:  Vitals:   11/05/20 1352  BP: 124/66  Pulse: 64  Weight: 187 lb 3.2 oz (84.9 kg)  Height: '5\' 5"'$  (1.651 m)   Wt Readings from Last 3 Encounters:  11/05/20 187 lb 3.2 oz (84.9 kg)  09/23/20 185 lb (83.9 kg)  08/07/20 184 lb 9.6 oz (83.7 kg)    Body mass index is 31.15 kg/m. No results found. Patient  is in no distress; well  developed, nourished and groomed; neck is supple  CARDIOVASCULAR: Examination of carotid arteries is normal; no carotid bruits Regular rate and rhythm, no murmurs Examination of peripheral vascular system by observation and palpation is normal  EYES: Ophthalmoscopic exam of optic discs and posterior segments is normal; no papilledema or hemorrhages  MUSCULOSKELETAL: Gait, strength, tone, movements noted in Neurologic exam below  NEUROLOGIC: MENTAL STATUS:  No flowsheet data found. awake, alert, oriented to person, place and time recent and remote memory intact normal attention and concentration language fluent, comprehension intact, naming intact,  fund of knowledge appropriate  CRANIAL NERVE:  2nd - no papilledema on fundoscopic exam 2nd, 3rd, 4th, 6th - pupils equal and reactive to light, visual fields full to confrontation, extraocular muscles intact, no nystagmus 5th - facial sensation symmetric 7th - facial strength symmetric 8th - hearing intact 9th - palate elevates symmetrically, uvula midline 11th - shoulder shrug symmetric 12th - tongue protrusion midline  MOTOR:  normal bulk and tone, full strength in the BUE, BLE  SENSORY:  normal and symmetric to light touch, temperature, vibration  COORDINATION:  finger-nose-finger, fine finger movements normal  REFLEXES:  deep tendon reflexes present and symmetric  GAIT/STATION:  narrow based gait    DIAGNOSTIC DATA (LABS, IMAGING, TESTING) - I reviewed patient records, labs, notes, testing and imaging myself where available.  Lab Results  Component Value Date   WBC 7.5 07/18/2020   HGB 12.4 07/18/2020   HCT 38.3 07/18/2020   MCV 83 07/18/2020   PLT 283 05/07/2019      Component Value Date/Time   NA 137 07/18/2020 1120   K 4.8 07/18/2020 1120   CL 96 07/18/2020 1120   CO2 26 07/18/2020 1120   GLUCOSE 186 (H) 07/18/2020 1120   GLUCOSE 246 (H) 12/25/2019 1507   BUN 17 07/18/2020  1120   CREATININE 0.83 07/18/2020 1120   CREATININE 0.81 12/25/2019 1507   CALCIUM 9.4 07/18/2020 1120   PROT 7.6 07/18/2020 1120   ALBUMIN 4.2 07/18/2020 1120   AST 27 07/18/2020 1120   ALT 20 07/18/2020 1120   ALKPHOS 50 07/18/2020 1120   BILITOT 0.2 07/18/2020 1120   GFRNONAA 71 12/25/2019 1507   GFRAA 82 12/25/2019 1507   Lab Results  Component Value Date   CHOL 179 07/18/2020   HDL 40 07/18/2020   LDLCALC 110 (H) 07/18/2020   TRIG 164 (H) 07/18/2020   CHOLHDL 4.5 (H) 07/18/2020   Lab Results  Component Value Date   HGBA1C 9.0 (A) 08/07/2020   Lab Results  Component Value Date   [id number] 07/18/2020   Lab Results  Component Value Date   TSH 0.40 08/07/2020    10/08/15 EKG - normal sinus rhythm  10/08/15 MRI brain [report only] - No acute intracranial pathology identified, specifically no acute infarct. - Mild to moderate chronic small vessel ischemic change. - Chronic sphenoid sinus disease.  10/29/15 Carotid u/s - Mild atherosclerotic disease in the carotid arteries. No significant carotid artery stenosis. Estimated degree of stenosis in the internal carotid arteries is less than 50% bilaterally.  - Patent vertebral arteries.  12/16/15 TTE  - Left ventricle: The cavity size was normal. There was mild   concentric hypertrophy. Systolic function was vigorous. The   estimated ejection fraction was in the range of 65% to 70%. Wall   motion was normal; there were no regional wall motion   abnormalities. Doppler parameters are consistent with abnormal   left ventricular relaxation (grade 1 diastolic dysfunction).  Doppler parameters are consistent with elevated ventricular   end-diastolic filling pressure. - Aortic valve: Trileaflet; normal thickness leaflets.   Transvalvular velocity was within the normal range. There was no   stenosis. There was no regurgitation. - Aortic root: The aortic root was normal in size. - Mitral valve: Structurally normal valve.  Transvalvular velocity   was within the normal range. There was no evidence for stenosis.   There was trivial regurgitation. - Right atrium: The atrium was normal in size. - Pulmonic valve: There was trivial regurgitation. - Pulmonary arteries: The main pulmonary artery was normal-sized.   Systolic pressure was within the normal range. - Inferior vena cava: The vessel was normal in size. The   respirophasic diameter changes were in the normal range (= 50%),   consistent with normal central venous pressure. - Pericardium, extracardiac: There was no pericardial effusion.    ASSESSMENT AND PLAN  75 y.o. year old female here with intermittent right face and arm numbness and tingling in July (and minor event Sept 2017), likely representing transient ischemic attacks. Symptoms localize to left parietal lobe versus left brain subcortical (posterior limb of internal capsule). Workup completed. Continue medical management for secondary stroke prevention.   Dx: TIA (left brain) vs post-dental procedure symptoms (right V2 trigeminal hypersensitivity/neuralgia)  1. Facial spasm      PLAN:  RIGHT FACIAL PAIN / TWITCHING - monitor sxs; if not improving, then consider MRI brain / IAC - continue aspirin '81mg'$  daily - continue statin and diabetes medications - TIA/stroke warning signs and education reviewed  Return for pending if symptoms worsen or fail to improve.    Penni Bombard, MD [id number], [id number] PM Certified in Neurology, Neurophysiology and Neuroimaging  [facility] [address], [place], [place] [address] [phone]

## 2020-12-03 ENCOUNTER — Other Ambulatory Visit: Payer: Self-pay | Admitting: Internal Medicine

## 2020-12-08 ENCOUNTER — Ambulatory Visit: Payer: Medicare HMO | Admitting: Internal Medicine

## 2020-12-15 ENCOUNTER — Other Ambulatory Visit: Payer: Self-pay | Admitting: Family Medicine

## 2020-12-17 ENCOUNTER — Encounter: Payer: Medicare HMO | Admitting: Family Medicine

## 2020-12-23 ENCOUNTER — Encounter: Payer: Self-pay | Admitting: Internal Medicine

## 2020-12-23 ENCOUNTER — Other Ambulatory Visit: Payer: Self-pay

## 2020-12-23 ENCOUNTER — Other Ambulatory Visit: Payer: Self-pay | Admitting: Internal Medicine

## 2020-12-23 ENCOUNTER — Ambulatory Visit (INDEPENDENT_AMBULATORY_CARE_PROVIDER_SITE_OTHER): Payer: Medicare HMO | Admitting: Internal Medicine

## 2020-12-23 VITALS — BP 142/68 | HR 64 | Ht 65.0 in | Wt 195.0 lb

## 2020-12-23 DIAGNOSIS — E890 Postprocedural hypothyroidism: Secondary | ICD-10-CM | POA: Diagnosis not present

## 2020-12-23 DIAGNOSIS — C73 Malignant neoplasm of thyroid gland: Secondary | ICD-10-CM

## 2020-12-23 DIAGNOSIS — E1159 Type 2 diabetes mellitus with other circulatory complications: Secondary | ICD-10-CM

## 2020-12-23 LAB — POCT GLYCOSYLATED HEMOGLOBIN (HGB A1C): Hemoglobin A1C: 10.1 % — AB (ref 4.0–5.6)

## 2020-12-23 MED ORDER — CEQUR SIMPLICITY INSERTER MISC: 0 refills | Status: DC

## 2020-12-23 MED ORDER — CEQUR SIMPLICITY 2U DEVI: 1.0000 | 11 refills | Status: DC

## 2020-12-23 NOTE — Progress Notes (Signed)
Patient ID: Jennifer Blackburn, female   DOB: 10-Aug-1945, 75 y.o.   MRN: [id number]   This visit occurred during the SARS-CoV-2 public health emergency.  Safety protocols were in place, including screening questions prior to the visit, additional usage of staff PPE, and extensive cleaning of exam room while observing appropriate contact time as indicated for disinfecting solutions.    HPI: Jennifer Blackburn is a 75 y.o.-year-old female, returning for follow-up for DM2, dx in 2001, insulin-dependent since 2017, uncontrolled, with long-term complications (DR, CAD) and also papillary thyroid cancer and postsurgical hypothyroidism.  Last visit 4 months ago.  Interim history: No increased urination, blurry vision, nausea, but has constipation.  She tried prune juice, MiraLAX, Linzess. She relaxed her diet in last 2 months -one of her relatives died and she was in amount of time. She missed insulin doses and ate out a lot.  She gained 8 pounds since last visit and sugars were higher. The whole body hurts.   DM2: Reviewed HbA1c levels: Lab Results  Component Value Date   HGBA1C 9.0 (A) 08/07/2020   HGBA1C 10.5 (A) 05/09/2020   HGBA1C 9.7 (A) 01/04/2020   HGBA1C 8.4 (A) 08/29/2019   HGBA1C 9.3 (A) 05/03/2019   HGBA1C 8.8 (A) 12/28/2018   HGBA1C 8.9 (A) 09/21/2018   HGBA1C 8.4 (A) 05/26/2018   HGBA1C 8.0 (A) 02/07/2018   HGBA1C 8.9 08/06/2017  01/12/2018: HbA1c 8.7% 08/05/2017: HbA1c 8.9% 07/11/2017: HbA1c 9.1%  Pt is on a regimen of: - Metformin 1000 mg 2x a day with meals. - Toujeo 30 units in a.m. and 50 units at bedtime >> 30 units in a.m. and 40 units at bedtime - Lispro 8-10 >> 10 mg 3x a day 15 min before dinner >> NovoLog 10 -15 units before each meal - missed doses - - could not start She was on Ozempic 0.5 mg weekly in a.m. >> 1 mg  - constipation >> stopped 06/2018.  We added it back 09/2018. She was on Victoza >> $$$. She was on Jardiance >> $$$. She was on Actos >> swelling -  stopped Spring 2019. She was on Glipizide XL 5 mg daily >> stopped 07/2017.  We retried glipizide 5 mg twice a day but had to stop 12/2018 due to lack of effect.  Pt checks her sugars 1-2 times a day: - am:  99, 110, 130-220 >> 99, 189-208, 270 >> 99, 130-180 >> 180-200 - 2h after b'fast: n/c - before lunch: n/c - 2h after lunch: n/c >> 265-300s >> n/c >> 200s >> 200s >> n/c - before dinner: n/c >> 185-190 >> n/c - 2h after dinner: n/c - bedtime: 180-210 >> 160-260 >> n/c >> 160-180 >> 170-180s - nighttime: n/c Lowest sugar was 99 >> 99 >> 170; she has hypoglycemia awareness in the 60s. Highest sugar was 270 >> 200s >> 200.  Glucometer: AccuChek Aviva  Pt's meals are: - Breakfast:toast, cereal, milk; oatmeal; bacon + egg >> oatmeal + coffee - Lunch: skips >> 2 eggs, fruit - Dinner: hamburger, or chicken, or fish, + veggies, bread, water and tea - Snacks: 2- fruit She was seeing a dietitian in [place]. She walks for exercise.  -No CKD, last BUN/creatinine:  Lab Results  Component Value Date   BUN 17 07/18/2020   BUN 13 12/25/2019   CREATININE 0.83 07/18/2020   CREATININE 0.81 12/25/2019  On quinapril.  -+ HL; last set of lipids: Lab Results  Component Value Date   CHOL 179 07/18/2020  HDL 40 07/18/2020   LDLCALC 110 (H) 07/18/2020   TRIG 164 (H) 07/18/2020   CHOLHDL 4.5 (H) 07/18/2020  On pravastatin 80.  - last eye exam was in 10/2020: No DR . Dr. Eulas Post in [place].  She has cataracts OU.   -No numbness and tingling in her feet. Has a podiatrist.   On ASA 81.  Pt has no FH of DM.  H/o Thyroid cancer (2001), s/p RAI treatment, now with postsurgical hypothyroidism.  Previously on brand-name Synthroid, now generic levothyroxine.  Pt is on levothyroxine 137 mcg daily, taken: - in am - fasting - at least 30 min from b'fast - no calcium - no iron - no multivitamins - + PPIs (Protonix) moved during the day - not on Biotin  Reviewed her TFTs: Lab  Results  Component Value Date   TSH 0.40 08/07/2020   TSH 1.60 05/09/2020   TSH 0.05 (L) 08/31/2019   TSH 0.04 (L) 12/28/2018   TSH 0.06 (L) 09/21/2018   TSH 0.61 05/26/2018   TSH 2.49 03/23/2016   TSH 0.070 (L) 10/13/2010   TSH 0.035 (L) 09/22/2006   TSH 0.015 (L) 06/17/2006  11/03/2017: TSH 0.044  Her thyroglobulin levels are detectable: Lab Results  Component Value Date   THYROGLB 0.4 (L) 08/07/2020   THYROGLB 1.2 (L) 05/09/2020   THYROGLB 0.4 (L) 08/31/2019   THYROGLB 0.3 (L) 09/21/2018   THYROGLB 0.7 (L) 05/26/2018   Lab Results  Component Value Date   THGAB <1 08/07/2020   THGAB <1 05/09/2020   THGAB <1 08/31/2019   THGAB <1 09/21/2018   THGAB <1 05/26/2018   Reviewed records from Dr. Ronnald Collum: Patient is status post total thyroidectomy in 2004 1.3 cm papillary thyroid cancer of the right lobe, followed by 3x RAI tx's with  33 mCi I-131 and a fourth RAI dose inpatient: 125 mCi, at [facility].    Whole-body scan on 09/08/2007 showed an increase in the thyroid bed activity and the follow up study with PET scan 10/08/2007 showed mildly hypermetabolic cervical lymph nodes.    Repeat PET 05/10/2008 showed no significant changes felt likely to be due to to a reactive process.  11/03/2017: TSH 0.044, thyroglobulin 0.2, ATA <1.0  04/05/2017: TSH 0.021 (0.45-4.5), thyroglobulin 0.3 (by IMA), ATA <1.0  Reviewed imaging test reports: Thyroid ultrasound (06/10/2018): 1.1 cm nonspecific calcified lesion in the right upper neck.  CT was recommended: There is no residual or recurrent tissue in the right or left thyroid beds. There is no evidence of abnormal adenopathy by short axis diameter measurement criteria There is a nonspecific calcified soft tissue mass measuring 1.1 cm in the right superior neck. This is of unknown significance. CT neck can be performed to further characterize.  CT (10/18/2018): Status post thyroidectomy. No soft tissue mass within the thyroidectomy bed. A 13 x 7  mm ovoid focus of calcification in the right aspect of the thyroidectomy bed likely corresponding with the finding on recent neck ultrasound and is unchanged as compared to neck CT 10/24/2007, benign.   No pathologically enlarged cervical chain lymph nodes. A nonenlarged calcified right supraclavicular lymph node is new from prior neck CT 10/24/2007 but also favored benign. Attention recommended on follow-up.  Neck U/S (06/03/2020): Ultrasound of the neck demonstrates no evidence of visible residual thyroid tissue or abnormal soft tissue mass.   Ovoid area of densely shadowing calcification again noted to the right of midline as seen by prior ultrasound and also demonstrated by prior CT studies of the  neck with documented stability between 2009 and 2020 studies.   Small left cervical lymph node has a similar appearance to the prior ultrasound study measuring 0.5 cm in short axis.   IMPRESSION: No evidence by ultrasound of recurrent thyroid malignancy in the neck.  Stable right neck calcification which has been documented to be benign and stable by prior CT.  Small left cervical lymph nodedemonstrates stable appearance and size by ultrasound  compared to the 2020 ultrasound.   Pt denies: - feeling nodules in neck - hoarseness - dysphagia - choking - SOB with lying down  She also has a history of HTN and anemia.  Her daughter lives in [place] and she is sick.  Patient travels between here in [place] frequently.   ROS: + See HPI Musculoskeletal: + muscle aches/+ joint aches  I reviewed pt's medications, allergies, PMH, social hx, family hx, and changes were documented in the history of present illness. Otherwise, unchanged from my initial visit note.  Past Medical History:  Diagnosis Date   Anxiety    Arthritis    CAD (coronary artery disease)    Depression    Diabetes mellitus type II    without complication   DJD (degenerative joint disease) of lumbar  spine    Hypercholesteremia    Hyperlipidemia    Hypertension    benign    Hypothyroidism    Thyroid cancer ([place]) 2001   Past Surgical History:  Procedure Laterality Date   ABDOMINAL HYSTERECTOMY     CARDIAC CATHETERIZATION     CATARACT EXTRACTION W/PHACO Right 07/17/2012   Procedure: CATARACT EXTRACTION PHACO AND INTRAOCULAR LENS PLACEMENT ([place]);  Surgeon: Tonny Branch, MD;  Location: [facility];  Service: Ophthalmology;  Laterality: Right;  CDE:25.51   CATARACT EXTRACTION W/PHACO Left 09/17/2016   Procedure: CATARACT EXTRACTION PHACO AND INTRAOCULAR LENS PLACEMENT LEFT EYE;  Surgeon: Tonny Branch, MD;  Location: [facility];  Service: Ophthalmology;  Laterality: Left;  CDE: 19.23   COLONOSCOPY     COLONOSCOPY N/A 01/15/2014   Procedure: COLONOSCOPY;  Surgeon: Daneil Dolin, MD;  Location: [facility] SUITE;  Service: Endoscopy;  Laterality: N/A;  9:00 AM   COLONOSCOPY WITH PROPOFOL N/A 01/25/2019   Procedure: COLONOSCOPY WITH PROPOFOL;  Surgeon: Daneil Dolin, MD; diverticulosis in the entire colon, otherwise normal exam.  No repeat colonoscopy due to age.   DOPPLER ECHOCARDIOGRAPHY     ESOPHAGOGASTRODUODENOSCOPY (EGD) WITH PROPOFOL N/A 01/25/2019   Procedure: ESOPHAGOGASTRODUODENOSCOPY (EGD) WITH PROPOFOL;  Surgeon: Daneil Dolin, MD; normal esophagus without dilation due to inability to pass dilator beyond the hypopharynx, 1 small nonbleeding gastric ulcer s/p biopsied, otherwise normal exam.  Pathology with ulcer with reactive changes, no H. pylori, metaplasia, dysplasia, or malignancy.   ESOPHAGOGASTRODUODENOSCOPY (EGD) WITH PROPOFOL N/A 08/21/2019   Procedure: ESOPHAGOGASTRODUODENOSCOPY (EGD) WITH PROPOFOL;  Surgeon: Daneil Dolin, MD; Normal esophagus. Previously noted gastric ulcer completely healed. Normal examined duodenum.    JOINT REPLACEMENT  07/01/2010   left hip   KNEE SURGERY Right    arthroscopy   left hip replaced  07/01/2010   SPINE SURGERY  2006   cervical   stress dipyridamole  myocardial perfusion     THYROIDECTOMY     TONSILLECTOMY     VESICOVAGINAL FISTULA CLOSURE W/ TAH     Social History   Socioeconomic History   Marital status: Married    Spouse name: Not on file   Number of children: 2   Years of education: 24  Highest education level: Not on file  Occupational History   Occupation: retired     Comment: bank  Tobacco Use   Smoking status: Never    Passive exposure: Yes   Smokeless tobacco: Never   Tobacco comments:    Husband smokes in the home  Vaping Use   Vaping Use: Never used  Substance and Sexual Activity   Alcohol use: No   Drug use: No   Sexual activity: Never  Other Topics Concern   Not on file  Social History Narrative   Lives with husband, at home   No caffeine   Social Determinants of Health   Financial Resource Strain: Low Risk    Difficulty of Paying Living Expenses: Not hard at all  Food Insecurity: No Food Insecurity   Worried About Charity fundraiser in the Last Year: Never true   [place] in the Last Year: Never true  Transportation Needs: No Transportation Needs   Lack of Transportation (Medical): No   Lack of Transportation (Non-Medical): No  Physical Activity: Insufficiently Active   Days of Exercise per Week: 3 days   Minutes of Exercise per Session: 20 min  Stress: No Stress Concern Present   Feeling of Stress : Not at all  Social Connections: Socially Integrated   Frequency of Communication with Friends and Family: More than three times a week   Frequency of Social Gatherings with Friends and Family: More than three times a week   Attends Religious Services: More than 4 times per year   Active Member of [employer] or Organizations: Yes   Attends Music therapist: More than 4 times per year   Marital Status: Married  Human resources officer Violence: Not At Risk   Fear of Current or Ex-Partner: No   Emotionally Abused: No   Physically Abused: No   Sexually Abused: No   Current Outpatient  Medications  Medication Sig Dispense Refill   acetaminophen (TYLENOL) 500 MG tablet Take 1,000 mg by mouth as needed (for pain.).     Cetirizine HCl 10 MG TBDP Take one tablet by mouth two times daily for allergy 60 tablet 5   Cholecalciferol (VITAMIN D3) 50 MCG (2000 UT) TABS Take 2,000 Units by mouth daily.     furosemide (LASIX) 20 MG tablet TAKE 1 TABLET BY MOUTH EVERY DAY 90 tablet 1   gabapentin (NEURONTIN) 100 MG capsule TAKE 1 CAPSULE BY MOUTH 3 TIMES A DAY 270 capsule 1   glucose blood (ACCU-CHEK GUIDE) test strip 1 each by Other route as needed for other. Use as instructed to check blood sugar 2 times per day dx code E11.65 100 each 3   hydrOXYzine (ATARAX/VISTARIL) 10 MG tablet TAKE ONE TABLET TWICE DAILY AND TWO TABLETS AT BEDTIME AS NEEDED, FOR GENERALIZED ITCHING 360 tablet 1   imipramine (TOFRANIL) 50 MG tablet TAKE 1 TABLET BY MOUTH EVERYDAY AT BEDTIME 90 tablet 2   insulin aspart (NOVOLOG FLEXPEN) 100 UNIT/ML FlexPen Inject 10-15 Units into the skin 3 (three) times daily with meals. 15 mL 2   insulin glargine, 1 Unit Dial, (TOUJEO SOLOSTAR) 300 UNIT/ML Solostar Pen Inject 30-50 units two times daily 10.5 mL 2   Insulin Pen Needle 32G X 4 MM MISC Use 4x a day 300 each 3   isosorbide mononitrate (IMDUR) 30 MG 24 hr tablet TAKE HALF A TABLET BY MOUTH EVERY DAY 45 tablet 3   Lancets (ACCU-CHEK MULTICLIX) lancets Use as instructed three times daily dx 250.01  100 each 5   levothyroxine (SYNTHROID) 137 MCG tablet TAKE 1 TABLET BY MOUTH DAILY BEFORE BREAKFAST. 90 tablet 1   metFORMIN (GLUCOPHAGE) 1000 MG tablet TAKE 1 TABLET BY MOUTH TWICE A DAY 180 tablet 1   metoprolol tartrate (LOPRESSOR) 50 MG tablet Take 1 tablet (50 mg total) by mouth 2 (two) times daily. 180 tablet 3   pantoprazole (PROTONIX) 40 MG tablet TAKE 1 TABLET BY MOUTH EVERY DAY (Patient not taking: Reported on 11/05/2020) 90 tablet 1   PARoxetine (PAXIL) 20 MG tablet TAKE 1 TABLET BY MOUTH EVERY DAY 90 tablet 1    potassium chloride SA (KLOR-CON) 20 MEQ tablet Take 1 tablet (20 mEq total) by mouth daily. 30 tablet 3   pravastatin (PRAVACHOL) 80 MG tablet TAKE 1 TABLET BY MOUTH EVERY DAY 90 tablet 1   quinapril (ACCUPRIL) 10 MG tablet TAKE 1 TABLET BY MOUTH EVERY DAY 90 tablet 1   Semaglutide, 1 MG/DOSE, (OZEMPIC, 1 MG/DOSE,) 4 MG/3ML SOPN Inject 1 mg into the skin once a week. 9 mL 3   verapamil (CALAN-SR) 180 MG CR tablet TAKE 1 TABLET BY MOUTH EVERYDAY AT BEDTIME 90 tablet 3   vitamin B-12 (CYANOCOBALAMIN) 1000 MCG tablet Take 1 tablet (1,000 mcg total) by mouth daily. 30 tablet 5   No current facility-administered medications for this visit.     Allergies  Allergen Reactions   Lipitor [Atorvastatin Calcium] Other (See Comments)    Muscle aches   Daypro [Oxaprozin] Hives   Nsaids Hives   Sulfonamide Derivatives Hives   Family History  Problem Relation Age of Onset   Heart attack Father    Heart failure Mother    Asthma Daughter    Sleep apnea Son        CPAP   Colon cancer Neg Hx    PE: BP (!) 142/68 (BP Location: Right Arm, Patient Position: Sitting, Cuff Size: Normal)   Pulse 64   Ht 5\' 5"  (1.651 m)   Wt 195 lb (88.5 kg)   LMP 11/11/2016   SpO2 98%   BMI 32.45 kg/m  Wt Readings from Last 3 Encounters:  12/23/20 195 lb (88.5 kg)  11/05/20 187 lb 3.2 oz (84.9 kg)  09/23/20 185 lb (83.9 kg)   Constitutional: overweight, in NAD Eyes: PERRLA, EOMI, no exophthalmos ENT: moist mucous membranes, no thyromegaly, no cervical lymphadenopathy Cardiovascular: RRR, No MRG Respiratory: CTA B Gastrointestinal: abdomen soft, NT, ND, BS+ Musculoskeletal: no deformities, strength intact in all 4 Skin: moist, warm, no rashes Neurological: no tremor with outstretched hands, DTR normal in all 4  ASSESSMENT: 1. DM2, insulin-dependent, uncontrolled, with long-term complications - CAD - DR  2.  Papillary thyroid cancer  3.  Postsurgical hypothyroidism  PLAN:  1. Patient with  longstanding, uncontrolled, type 2 diabetes, on metformin, basal-bolus insulin regimen.  This was causing constipation for her in the past so we discussed about trying MiraLAX and magnesium.  We did not change her insulin doses at last visit and continues metformin.  HbA1c was 9.0%, slightly lower. -At today's visit, sugars are higher, all above target in the morning and also high throughout the day.  Upon questioning, she relaxed her diet since last visit as she was traveling and was taking care of of a sick relative.  We discussed that she absolutely needs to start improving her diet.  She plans to do so.  I also advised her about regular physical activity.  She will have a treadmill from her cousin and I  encouraged him to start walking on it. -At this visit she tells me that she was not able to start Ozempic as the company did not receive for patient assistance program paperwork.  We did send this after our last visit and we will send it again today.  I advised her to let me know if she does not hear from them within a week. -For now, as she is planning to improve her diet and start taking insulin consistently, I did not suggest any increasing dose.  I did not suggest the Cequr simplicity system and demonstrated how it works.  She is interested in this so I sent it to the pharmacy to see if this is covered.  If it is, I will need to send a prescription for NovoLog vials to her pharmacy.  She will let me know. - I suggested to:  Patient Instructions  Please continue: - Metformin 1000 mg 2x a day with meals. - Toujeo 30 units in a.m. and 40 units at bedtime - Novolog 10-15 units before each meal  Restart: - Ozempic 1 mg weekly  Please continue levothyroxine 137 mcg daily.  Take the thyroid hormone every day, with water, at least 30 minutes before breakfast, separated by at least 4 hours from: - acid reflux medications - calcium - iron - multivitamins  Please return in 4 months with your sugar  log.  - we checked her HbA1c: 10.1% (higher) - advised to check sugars at different times of the day - 3-4x a day, rotating check times - advised for yearly eye exams >> she is UTD - return to clinic in 3-4 months   2.  Papillary thyroid cancer -Previous thyroid cancer records were reviewed from Dr. Ronnald Collum -She has metastatic thyroid cancer and she had 4 RAI treatments.  She also had increased signal on the PET scan from 2009, however, in 2010, another PET scan showed possible inflammatory lymph nodes in the area. -Her thyroglobulin levels were detectable but stable in the past however, she had a high level in 04/2020.  We repeated this at last visit, though, and the levels returned to baseline. -Latest neck ultrasound did not show any new concerning masses.  Previous neck ultrasounds also showed a specific calcified lesion in the right upper neck and a CT scan of the neck in 2019 showed that the calcified mass was stable, therefore, most likely benign -We will check her thyroglobulin and ATA antibodies a year from the previous values  3.  Postsurgical hypothyroidism - latest thyroid labs reviewed with pt. >> normal: Lab Results  Component Value Date   TSH 0.40 08/07/2020  - she continues on LT4 137 mcg daily - pt feels good on this dose. - we discussed about taking the thyroid hormone every day, with water, >30 minutes before breakfast, separated by >4 hours from acid reflux medications, calcium, iron, multivitamins. Pt. is taking it correctly.  Philemon Kingdom, MD PhD [facility] Endocrinology

## 2020-12-23 NOTE — Patient Instructions (Addendum)
Please continue: - Metformin 1000 mg 2x a day with meals. - Toujeo 30 units in a.m. and 40 units at bedtime - Novolog 10-15 units before each meal  Restart: - Ozempic 1 mg weekly  Please continue levothyroxine 137 mcg daily.  Take the thyroid hormone every day, with water, at least 30 minutes before breakfast, separated by at least 4 hours from: - acid reflux medications - calcium - iron - multivitamins  Please return in 4 months with your sugar log.

## 2020-12-25 ENCOUNTER — Other Ambulatory Visit: Payer: Self-pay

## 2020-12-25 ENCOUNTER — Encounter: Payer: Self-pay | Admitting: Nurse Practitioner

## 2020-12-25 ENCOUNTER — Ambulatory Visit (INDEPENDENT_AMBULATORY_CARE_PROVIDER_SITE_OTHER): Payer: Medicare HMO | Admitting: Nurse Practitioner

## 2020-12-25 VITALS — BP 130/72 | HR 65 | Ht 65.0 in | Wt 191.1 lb

## 2020-12-25 DIAGNOSIS — N6311 Unspecified lump in the right breast, upper outer quadrant: Secondary | ICD-10-CM | POA: Diagnosis not present

## 2020-12-25 DIAGNOSIS — N6310 Unspecified lump in the right breast, unspecified quadrant: Secondary | ICD-10-CM | POA: Insufficient documentation

## 2020-12-25 NOTE — Progress Notes (Signed)
Acute Office Visit  Subjective:    Patient ID: Jennifer Blackburn, female    DOB: [birth year], 75 y.o.   MRN: [id number]  Chief Complaint  Patient presents with   Follow-up    Lump in under arm area noticed this morning     HPI Patient is in today for a lump she found in her right axilla this AM. She has noticed some swelling in her right breast previously, but she first felt the lump this AM. She had negative mammogram 04/17/20.  Past Medical History:  Diagnosis Date   Anxiety    Arthritis    CAD (coronary artery disease)    Depression    Diabetes mellitus type II    without complication   DJD (degenerative joint disease) of lumbar spine    Hypercholesteremia    Hyperlipidemia    Hypertension    benign    Hypothyroidism    Thyroid cancer ([place]) 2001    Past Surgical History:  Procedure Laterality Date   ABDOMINAL HYSTERECTOMY     CARDIAC CATHETERIZATION     CATARACT EXTRACTION W/PHACO Right 07/17/2012   Procedure: CATARACT EXTRACTION PHACO AND INTRAOCULAR LENS PLACEMENT ([place]);  Surgeon: Tonny Branch, MD;  Location: [facility];  Service: Ophthalmology;  Laterality: Right;  CDE:25.51   CATARACT EXTRACTION W/PHACO Left 09/17/2016   Procedure: CATARACT EXTRACTION PHACO AND INTRAOCULAR LENS PLACEMENT LEFT EYE;  Surgeon: Tonny Branch, MD;  Location: [facility];  Service: Ophthalmology;  Laterality: Left;  CDE: 19.23   COLONOSCOPY     COLONOSCOPY N/A 01/15/2014   Procedure: COLONOSCOPY;  Surgeon: Daneil Dolin, MD;  Location: [facility];  Service: Endoscopy;  Laterality: N/A;  9:00 AM   COLONOSCOPY WITH PROPOFOL N/A 01/25/2019   Procedure: COLONOSCOPY WITH PROPOFOL;  Surgeon: Daneil Dolin, MD; diverticulosis in the entire colon, otherwise normal exam.  No repeat colonoscopy due to age.   DOPPLER ECHOCARDIOGRAPHY     ESOPHAGOGASTRODUODENOSCOPY (EGD) WITH PROPOFOL N/A 01/25/2019   Procedure: ESOPHAGOGASTRODUODENOSCOPY (EGD) WITH PROPOFOL;  Surgeon: Daneil Dolin, MD; normal esophagus  without dilation due to inability to pass dilator beyond the hypopharynx, 1 small nonbleeding gastric ulcer s/p biopsied, otherwise normal exam.  Pathology with ulcer with reactive changes, no H. pylori, metaplasia, dysplasia, or malignancy.   ESOPHAGOGASTRODUODENOSCOPY (EGD) WITH PROPOFOL N/A 08/21/2019   Procedure: ESOPHAGOGASTRODUODENOSCOPY (EGD) WITH PROPOFOL;  Surgeon: Daneil Dolin, MD; Normal esophagus. Previously noted gastric ulcer completely healed. Normal examined duodenum.    JOINT REPLACEMENT  07/01/2010   left hip   KNEE SURGERY Right    arthroscopy   left hip replaced  07/01/2010   SPINE SURGERY  2006   cervical   stress dipyridamole myocardial perfusion     THYROIDECTOMY     TONSILLECTOMY     VESICOVAGINAL FISTULA CLOSURE W/ TAH      Family History  Problem Relation Age of Onset   Heart attack Father    Heart failure Mother    Asthma Daughter    Sleep apnea Son        CPAP   Colon cancer Neg Hx     Social History   Socioeconomic History   Marital status: Married    Spouse name: Not on file   Number of children: 2   Years of education: 14   Highest education level: Not on file  Occupational History   Occupation: retired     Comment: bank  Tobacco Use   Smoking status: Never    Passive  exposure: Yes   Smokeless tobacco: Never   Tobacco comments:    Husband smokes in the home  Vaping Use   Vaping Use: Never used  Substance and Sexual Activity   Alcohol use: No   Drug use: No   Sexual activity: Never  Other Topics Concern   Not on file  Social History Narrative   Lives with husband, at home   No caffeine   Social Determinants of Health   Financial Resource Strain: Low Risk    Difficulty of Paying Living Expenses: Not hard at all  Food Insecurity: No Food Insecurity   Worried About Charity fundraiser in the Last Year: Never true   [place] in the Last Year: Never true  Transportation Needs: No Transportation Needs   Lack of  Transportation (Medical): No   Lack of Transportation (Non-Medical): No  Physical Activity: Insufficiently Active   Days of Exercise per Week: 3 days   Minutes of Exercise per Session: 20 min  Stress: No Stress Concern Present   Feeling of Stress : Not at all  Social Connections: Socially Integrated   Frequency of Communication with Friends and Family: More than three times a week   Frequency of Social Gatherings with Friends and Family: More than three times a week   Attends Religious Services: More than 4 times per year   Active Member of [employer] or Organizations: Yes   Attends Music therapist: More than 4 times per year   Marital Status: Married  Human resources officer Violence: Not At Risk   Fear of Current or Ex-Partner: No   Emotionally Abused: No   Physically Abused: No   Sexually Abused: No    Outpatient Medications Prior to Visit  Medication Sig Dispense Refill   acetaminophen (TYLENOL) 500 MG tablet Take 1,000 mg by mouth as needed (for pain.).     Cetirizine HCl 10 MG TBDP Take one tablet by mouth two times daily for allergy 60 tablet 5   Cholecalciferol (VITAMIN D3) 50 MCG (2000 UT) TABS Take 2,000 Units by mouth daily.     furosemide (LASIX) 20 MG tablet TAKE 1 TABLET BY MOUTH EVERY DAY 90 tablet 1   gabapentin (NEURONTIN) 100 MG capsule TAKE 1 CAPSULE BY MOUTH 3 TIMES A DAY 270 capsule 1   glucose blood (ACCU-CHEK GUIDE) test strip 1 each by Other route as needed for other. Use as instructed to check blood sugar 2 times per day dx code E11.65 100 each 3   hydrOXYzine (ATARAX/VISTARIL) 10 MG tablet TAKE ONE TABLET TWICE DAILY AND TWO TABLETS AT BEDTIME AS NEEDED, FOR GENERALIZED ITCHING 360 tablet 1   imipramine (TOFRANIL) 50 MG tablet TAKE 1 TABLET BY MOUTH EVERYDAY AT BEDTIME 90 tablet 2   injection device for insulin (CEQUR SIMPLICITY 2U) DEVI 1 each by Other route every 3 (three) days. 10 each 11   Injection Device for Insulin (CEQUR SIMPLICITY INSERTER) MISC  Use as advised RX: E11.59 1 each 0   insulin aspart (NOVOLOG FLEXPEN) 100 UNIT/ML FlexPen Inject 10-15 Units into the skin 3 (three) times daily with meals. 15 mL 2   insulin glargine, 1 Unit Dial, (TOUJEO SOLOSTAR) 300 UNIT/ML Solostar Pen Inject 30-50 units two times daily 10.5 mL 2   Insulin Pen Needle 32G X 4 MM MISC Use 4x a day 300 each 3   isosorbide mononitrate (IMDUR) 30 MG 24 hr tablet TAKE HALF A TABLET BY MOUTH EVERY DAY 45 tablet 3  Lancets (ACCU-CHEK MULTICLIX) lancets Use as instructed three times daily dx 250.01 100 each 5   levothyroxine (SYNTHROID) 137 MCG tablet TAKE 1 TABLET BY MOUTH DAILY BEFORE BREAKFAST. 90 tablet 1   metFORMIN (GLUCOPHAGE) 1000 MG tablet TAKE 1 TABLET BY MOUTH TWICE A DAY 180 tablet 1   metoprolol tartrate (LOPRESSOR) 50 MG tablet Take 1 tablet (50 mg total) by mouth 2 (two) times daily. 180 tablet 3   PARoxetine (PAXIL) 20 MG tablet TAKE 1 TABLET BY MOUTH EVERY DAY 90 tablet 1   potassium chloride SA (KLOR-CON) 20 MEQ tablet Take 1 tablet (20 mEq total) by mouth daily. 30 tablet 3   pravastatin (PRAVACHOL) 80 MG tablet TAKE 1 TABLET BY MOUTH EVERY DAY 90 tablet 1   quinapril (ACCUPRIL) 10 MG tablet TAKE 1 TABLET BY MOUTH EVERY DAY 90 tablet 1   Semaglutide, 1 MG/DOSE, (OZEMPIC, 1 MG/DOSE,) 4 MG/3ML SOPN Inject 1 mg into the skin once a week. 9 mL 3   verapamil (CALAN-SR) 180 MG CR tablet TAKE 1 TABLET BY MOUTH EVERYDAY AT BEDTIME 90 tablet 3   vitamin B-12 (CYANOCOBALAMIN) 1000 MCG tablet Take 1 tablet (1,000 mcg total) by mouth daily. 30 tablet 5   No facility-administered medications prior to visit.    Allergies  Allergen Reactions   Lipitor [Atorvastatin Calcium] Other (See Comments)    Muscle aches   Daypro [Oxaprozin] Hives   Nsaids Hives   Sulfonamide Derivatives Hives    Review of Systems  Constitutional: Negative.   Skin:        Right breast/axilla mass      Objective:    Physical Exam Exam conducted with a chaperone present  Joellen Jersey, CMA).  Constitutional:      Appearance: Normal appearance. She is obese.  Chest:  Breasts:    Right: Swelling and mass present.     Left: Normal.    Lymphadenopathy:     Upper Body:     Right upper body: Axillary adenopathy present.  Neurological:     Mental Status: She is alert.    BP 130/72 (BP Location: Right Arm, Patient Position: Sitting, Cuff Size: Large)   Pulse 65   Ht 5' 5" (1.651 m)   Wt 191 lb 1.9 oz (86.7 kg)   LMP 11/11/2016   SpO2 97%   BMI 31.80 kg/m  Wt Readings from Last 3 Encounters:  12/25/20 191 lb 1.9 oz (86.7 kg)  12/23/20 195 lb (88.5 kg)  11/05/20 187 lb 3.2 oz (84.9 kg)    Health Maintenance Due  Topic Date Due   Zoster Vaccines- Shingrix (1 of 2) Never done   COVID-19 Vaccine (4 - Booster for Moderna series) 04/17/2020   FOOT EXAM  08/29/2020   INFLUENZA VACCINE  10/20/2020   TETANUS/TDAP  12/20/2020    There are no preventive care reminders to display for this patient.   Lab Results  Component Value Date   TSH 0.40 08/07/2020   Lab Results  Component Value Date   WBC 7.5 07/18/2020   HGB 12.4 07/18/2020   HCT 38.3 07/18/2020   MCV 83 07/18/2020   PLT 283 05/07/2019   Lab Results  Component Value Date   NA 137 07/18/2020   K 4.8 07/18/2020   CO2 26 07/18/2020   GLUCOSE 186 (H) 07/18/2020   BUN 17 07/18/2020   CREATININE 0.83 07/18/2020   BILITOT 0.2 07/18/2020   ALKPHOS 50 07/18/2020   AST 27 07/18/2020   ALT 20 07/18/2020  PROT 7.6 07/18/2020   ALBUMIN 4.2 07/18/2020   CALCIUM 9.4 07/18/2020   ANIONGAP 13 08/15/2019   EGFR 73 07/18/2020   Lab Results  Component Value Date   CHOL 179 07/18/2020   Lab Results  Component Value Date   HDL 40 07/18/2020   Lab Results  Component Value Date   LDLCALC 110 (H) 07/18/2020   Lab Results  Component Value Date   TRIG 164 (H) 07/18/2020   Lab Results  Component Value Date   CHOLHDL 4.5 (H) 07/18/2020   Lab Results  Component Value Date   HGBA1C 10.1  (A) 12/23/2020       Assessment & Plan:   Problem List Items Addressed This Visit       Other   Breast mass, right - Primary    -palpable mass in upper outer quadrant of right breast/axilla -will get diagnostic mammo and U/S      Relevant Orders   MM Digital Diagnostic Bilat   US BREAST COMPLETE UNI RIGHT INC AXILLA     No orders of the defined types were placed in this encounter.    Noreene Larsson, NP

## 2020-12-25 NOTE — Assessment & Plan Note (Signed)
-  palpable mass in upper outer quadrant of right breast/axilla -will get diagnostic mammo and U/S

## 2021-01-06 ENCOUNTER — Telehealth: Payer: Self-pay | Admitting: Internal Medicine

## 2021-01-06 NOTE — Telephone Encounter (Signed)
Pt calling in to request the next steps of Assistance Application (dating back from July 2022) for Ozempic. Pt contact [phone]

## 2021-01-07 NOTE — Telephone Encounter (Signed)
Called and left a detailed message for pt advising application was sent back in August and resent today 01/07/21 and she will have to follow up with [employer] at [phone] regarding the status of her app.

## 2021-01-12 ENCOUNTER — Emergency Department (HOSPITAL_COMMUNITY): Payer: Medicare HMO

## 2021-01-12 ENCOUNTER — Encounter (HOSPITAL_COMMUNITY): Payer: Self-pay | Admitting: *Deleted

## 2021-01-12 ENCOUNTER — Telehealth: Payer: Self-pay

## 2021-01-12 ENCOUNTER — Other Ambulatory Visit: Payer: Self-pay

## 2021-01-12 ENCOUNTER — Emergency Department (HOSPITAL_COMMUNITY)
Admission: EM | Admit: 2021-01-12 | Discharge: 2021-01-12 | Disposition: A | Discharge status: Home / Self Care | Payer: Medicare HMO | Attending: Emergency Medicine | Admitting: Emergency Medicine

## 2021-01-12 DIAGNOSIS — W1839XA Other fall on same level, initial encounter: Secondary | ICD-10-CM | POA: Insufficient documentation

## 2021-01-12 DIAGNOSIS — Z7984 Long term (current) use of oral hypoglycemic drugs: Secondary | ICD-10-CM | POA: Diagnosis not present

## 2021-01-12 DIAGNOSIS — S299XXA Unspecified injury of thorax, initial encounter: Secondary | ICD-10-CM | POA: Diagnosis not present

## 2021-01-12 DIAGNOSIS — R079 Chest pain, unspecified: Secondary | ICD-10-CM

## 2021-01-12 DIAGNOSIS — E1165 Type 2 diabetes mellitus with hyperglycemia: Secondary | ICD-10-CM | POA: Diagnosis not present

## 2021-01-12 DIAGNOSIS — Z794 Long term (current) use of insulin: Secondary | ICD-10-CM | POA: Insufficient documentation

## 2021-01-12 DIAGNOSIS — Z79899 Other long term (current) drug therapy: Secondary | ICD-10-CM | POA: Insufficient documentation

## 2021-01-12 DIAGNOSIS — Z20822 Contact with and (suspected) exposure to covid-19: Secondary | ICD-10-CM | POA: Insufficient documentation

## 2021-01-12 DIAGNOSIS — Z7722 Contact with and (suspected) exposure to environmental tobacco smoke (acute) (chronic): Secondary | ICD-10-CM | POA: Insufficient documentation

## 2021-01-12 DIAGNOSIS — Z85850 Personal history of malignant neoplasm of thyroid: Secondary | ICD-10-CM | POA: Diagnosis not present

## 2021-01-12 DIAGNOSIS — I10 Essential (primary) hypertension: Secondary | ICD-10-CM | POA: Insufficient documentation

## 2021-01-12 DIAGNOSIS — Z96642 Presence of left artificial hip joint: Secondary | ICD-10-CM | POA: Insufficient documentation

## 2021-01-12 DIAGNOSIS — Z043 Encounter for examination and observation following other accident: Secondary | ICD-10-CM | POA: Diagnosis not present

## 2021-01-12 DIAGNOSIS — R1010 Upper abdominal pain, unspecified: Secondary | ICD-10-CM | POA: Diagnosis not present

## 2021-01-12 DIAGNOSIS — I2510 Atherosclerotic heart disease of native coronary artery without angina pectoris: Secondary | ICD-10-CM | POA: Diagnosis not present

## 2021-01-12 DIAGNOSIS — S8992XA Unspecified injury of left lower leg, initial encounter: Secondary | ICD-10-CM | POA: Insufficient documentation

## 2021-01-12 DIAGNOSIS — Y92002 Bathroom of unspecified non-institutional (private) residence single-family (private) house as the place of occurrence of the external cause: Secondary | ICD-10-CM | POA: Diagnosis not present

## 2021-01-12 DIAGNOSIS — R0789 Other chest pain: Secondary | ICD-10-CM | POA: Diagnosis not present

## 2021-01-12 DIAGNOSIS — R519 Headache, unspecified: Secondary | ICD-10-CM | POA: Insufficient documentation

## 2021-01-12 DIAGNOSIS — R55 Syncope and collapse: Secondary | ICD-10-CM | POA: Insufficient documentation

## 2021-01-12 DIAGNOSIS — E039 Hypothyroidism, unspecified: Secondary | ICD-10-CM | POA: Insufficient documentation

## 2021-01-12 DIAGNOSIS — E871 Hypo-osmolality and hyponatremia: Secondary | ICD-10-CM | POA: Diagnosis not present

## 2021-01-12 DIAGNOSIS — S0990XA Unspecified injury of head, initial encounter: Secondary | ICD-10-CM | POA: Diagnosis not present

## 2021-01-12 DIAGNOSIS — M1712 Unilateral primary osteoarthritis, left knee: Secondary | ICD-10-CM | POA: Diagnosis not present

## 2021-01-12 LAB — URINALYSIS, MICROSCOPIC (REFLEX): RBC / HPF: NONE SEEN RBC/hpf (ref 0–5)

## 2021-01-12 LAB — URINALYSIS, ROUTINE W REFLEX MICROSCOPIC
Bilirubin Urine: NEGATIVE
Glucose, UA: NEGATIVE mg/dL
Hgb urine dipstick: NEGATIVE
Ketones, ur: NEGATIVE mg/dL
Nitrite: NEGATIVE
Protein, ur: NEGATIVE mg/dL
Specific Gravity, Urine: 1.02 (ref 1.005–1.030)
pH: 5 (ref 5.0–8.0)

## 2021-01-12 LAB — HEPATIC FUNCTION PANEL
ALT: 21 U/L (ref 0–44)
AST: 28 U/L (ref 15–41)
Albumin: 4.1 g/dL (ref 3.5–5.0)
Alkaline Phosphatase: 43 U/L (ref 38–126)
Bilirubin, Direct: <0.1 mg/dL (ref 0.0–0.2)
Total Bilirubin: 0.4 mg/dL (ref 0.3–1.2)
Total Protein: 8.3 g/dL — ABNORMAL HIGH (ref 6.5–8.1)

## 2021-01-12 LAB — BASIC METABOLIC PANEL
Anion gap: 10 (ref 5–15)
BUN: 21 mg/dL (ref 8–23)
CO2: 25 mmol/L (ref 22–32)
Calcium: 8.5 mg/dL — ABNORMAL LOW (ref 8.9–10.3)
Chloride: 97 mmol/L — ABNORMAL LOW (ref 98–111)
Creatinine, Ser: 0.98 mg/dL (ref 0.44–1.00)
GFR, Estimated: 60 mL/min — ABNORMAL LOW (ref >60)
Glucose, Bld: 174 mg/dL — ABNORMAL HIGH (ref 70–99)
Potassium: 4.2 mmol/L (ref 3.5–5.1)
Sodium: 132 mmol/L — ABNORMAL LOW (ref 135–145)

## 2021-01-12 LAB — RESP PANEL BY RT-PCR (FLU A&B, COVID) ARPGX2
Influenza A by PCR: NEGATIVE
Influenza B by PCR: NEGATIVE
SARS Coronavirus 2 by RT PCR: NEGATIVE

## 2021-01-12 LAB — CBC
HCT: 38.9 % (ref 36.0–46.0)
Hemoglobin: 12 g/dL (ref 12.0–15.0)
MCH: 27.1 pg (ref 26.0–34.0)
MCHC: 30.8 g/dL (ref 30.0–36.0)
MCV: 87.8 fL (ref 80.0–100.0)
Platelets: 307 10*3/uL (ref 150–400)
RBC: 4.43 MIL/uL (ref 3.87–5.11)
RDW: 13.9 % (ref 11.5–15.5)
WBC: 8 10*3/uL (ref 4.0–10.5)
nRBC: 0 % (ref 0.0–0.2)

## 2021-01-12 LAB — TROPONIN I (HIGH SENSITIVITY)
Troponin I (High Sensitivity): 2 ng/L (ref <18)
Troponin I (High Sensitivity): 2 ng/L (ref <18)

## 2021-01-12 LAB — LIPASE, BLOOD: Lipase: 29 U/L (ref 11–51)

## 2021-01-12 NOTE — Telephone Encounter (Signed)
Pt reports Sunday am she started feeling nauseous and having abdominal pain and she went to the restroom and while she was sitting there she started to feel funny and she got up to go lay back down and she fainted. Her husband came to check on her and she began vomiting 3 or 4 times and stomach was hurting very bad. She did not want to go to the ER so she went and laid back down. No more vomiting but she is sore all over from falling. Recommended ER but since she did not want to go- scheduled her to be evaluated tomorrow 10/25 and told if she gets worse or needs eval sooner to go to the ER

## 2021-01-12 NOTE — ED Triage Notes (Signed)
Pt states she passed out yesterday morning and stayed in bed all day yesterday.  Pt began to feel lethargic while using the bathroom earlier today. Denies any SOB or CP.  Decrease appetite.  Decrease in taste. Left leg pain since fall. Denies any cough.

## 2021-01-12 NOTE — ED Provider Notes (Signed)
[facility] EMERGENCY DEPARTMENT Provider Note   CSN: [id number] Arrival date & time: 01/12/21  1633     History Chief Complaint  Patient presents with   Loss of Consciousness    JNAYA BUTRICK is a 75 y.o. female.  She said she went to bed with a stomachache 2 days ago.  Woke up early in the morning and had to use the bathroom.  Felt lightheaded and had syncopal event going back to the bedroom.  When she came to she vomited 4 times.  Husband helped her back to bed.  Woke up yesterday feeling sore from having fallen and feeling like she does not taste things as well now.  Still has a little bit of upper abdominal discomfort.  Mild headache.  Soreness in her chest when she moves.  Also has left knee pain from the syncopal event.  No ductus or weakness.  No blurry vision double vision.  The history is provided by the patient.  Loss of Consciousness Episode history:  Single Most recent episode:  Yesterday Progression:  Resolved Chronicity:  New Context: normal activity   Witnessed: no   Relieved by:  Bed rest Worsened by:  Nothing Ineffective treatments:  None tried Associated symptoms: chest pain, dizziness, nausea and vomiting   Associated symptoms: no difficulty breathing, no fever and no shortness of breath       Past Medical History:  Diagnosis Date   Anxiety    Arthritis    CAD (coronary artery disease)    Depression    Diabetes mellitus type II    without complication   DJD (degenerative joint disease) of lumbar spine    Hypercholesteremia    Hyperlipidemia    Hypertension    benign    Hypothyroidism    Thyroid cancer ([place]) 2001    Patient Active Problem List   Diagnosis Date Noted   Breast mass, right 12/25/2020   Conjunctivitis 09/23/2020   Bloating 06/26/2020   Nausea without vomiting 06/26/2020   Constipation 06/06/2020   Belching 06/06/2020   History of gastric ulcer 05/07/2019   Muscle pain 02/07/2019   Fatigue 02/07/2019   Dysphagia 11/11/2018    History of colonic polyps 11/11/2018   Obesity (BMI 30.0-34.9) 11/05/2018   Cervical spondylosis with radiculopathy 06/20/2017   Hypertension    Hyperlipidemia    Depression    CAD (coronary artery disease)    Anxiety    Osteoarthritis 02/17/2017   Family history of coronary artery disease in father 02/17/2017   Lumbar spondylosis with myelopathy 07/18/2016   Laryngopharyngeal reflux (LPR) 06/24/2016   Type 2 diabetes mellitus with vascular disease ([place]) 01/13/2015   Insomnia due to stress 08/20/2014   GERD (gastroesophageal reflux disease) 04/11/2014   At high risk for falls 12/31/2013   Postsurgical hypothyroidism 01/05/2013   Snoring 01/05/2013   Back pain with radiculopathy 11/28/2012   Anxiety and depression 10/16/2011   Anemia 03/30/2011   Posterior right knee pain 01/24/2011   Vitamin D deficiency 10/14/2010   Bilateral carotid bruits 10/13/2010   Allergic rhinitis 11/23/2007   CARPAL TUNNEL SYNDROME, BILATERAL 04/14/2007   Coronary atherosclerosis 04/14/2007   Headache 04/14/2007   Thyroid cancer ([place]) 03/23/1999    Past Surgical History:  Procedure Laterality Date   ABDOMINAL HYSTERECTOMY     CARDIAC CATHETERIZATION     CATARACT EXTRACTION W/PHACO Right 07/17/2012   Procedure: CATARACT EXTRACTION PHACO AND INTRAOCULAR LENS PLACEMENT ([place]);  Surgeon: Tonny Branch, MD;  Location: [facility] ORS;  Service: Ophthalmology;  Laterality: Right;  CDE:25.51   CATARACT EXTRACTION W/PHACO Left 09/17/2016   Procedure: CATARACT EXTRACTION PHACO AND INTRAOCULAR LENS PLACEMENT LEFT EYE;  Surgeon: Tonny Branch, MD;  Location: [facility] ORS;  Service: Ophthalmology;  Laterality: Left;  CDE: 19.23   COLONOSCOPY     COLONOSCOPY N/A 01/15/2014   Procedure: COLONOSCOPY;  Surgeon: Daneil Dolin, MD;  Location: [facility] ENDO SUITE;  Service: Endoscopy;  Laterality: N/A;  9:00 AM   COLONOSCOPY WITH PROPOFOL N/A 01/25/2019   Procedure: COLONOSCOPY WITH PROPOFOL;  Surgeon: Daneil Dolin, MD; diverticulosis in  the entire colon, otherwise normal exam.  No repeat colonoscopy due to age.   DOPPLER ECHOCARDIOGRAPHY     ESOPHAGOGASTRODUODENOSCOPY (EGD) WITH PROPOFOL N/A 01/25/2019   Procedure: ESOPHAGOGASTRODUODENOSCOPY (EGD) WITH PROPOFOL;  Surgeon: Daneil Dolin, MD; normal esophagus without dilation due to inability to pass dilator beyond the hypopharynx, 1 small nonbleeding gastric ulcer s/p biopsied, otherwise normal exam.  Pathology with ulcer with reactive changes, no H. pylori, metaplasia, dysplasia, or malignancy.   ESOPHAGOGASTRODUODENOSCOPY (EGD) WITH PROPOFOL N/A 08/21/2019   Procedure: ESOPHAGOGASTRODUODENOSCOPY (EGD) WITH PROPOFOL;  Surgeon: Daneil Dolin, MD; Normal esophagus. Previously noted gastric ulcer completely healed. Normal examined duodenum.    JOINT REPLACEMENT  07/01/2010   left hip   KNEE SURGERY Right    arthroscopy   left hip replaced  07/01/2010   SPINE SURGERY  2006   cervical   stress dipyridamole myocardial perfusion     THYROIDECTOMY     TONSILLECTOMY     VESICOVAGINAL FISTULA CLOSURE W/ TAH       OB History   No obstetric history on file.     Family History  Problem Relation Age of Onset   Heart attack Father    Heart failure Mother    Asthma Daughter    Sleep apnea Son        CPAP   Colon cancer Neg Hx     Social History   Tobacco Use   Smoking status: Never    Passive exposure: Yes   Smokeless tobacco: Never   Tobacco comments:    Husband smokes in the home  Vaping Use   Vaping Use: Never used  Substance Use Topics   Alcohol use: No   Drug use: No    Home Medications Prior to Admission medications   Medication Sig Start Date End Date Taking? Authorizing Provider  acetaminophen (TYLENOL) 500 MG tablet Take 1,000 mg by mouth as needed (for pain.).    [provider]  Cetirizine HCl 10 MG TBDP Take one tablet by mouth two times daily for allergy 11/06/19   Fayrene Helper, MD  Cholecalciferol (VITAMIN D3) 50 MCG (2000 UT) TABS  Take 2,000 Units by mouth daily.    [provider]  furosemide (LASIX) 20 MG tablet TAKE 1 TABLET BY MOUTH EVERY DAY 07/10/20   Fayrene Helper, MD  gabapentin (NEURONTIN) 100 MG capsule TAKE 1 CAPSULE BY MOUTH 3 TIMES A DAY 01/22/20   Fayrene Helper, MD  glucose blood (ACCU-CHEK GUIDE) test strip 1 each by Other route as needed for other. Use as instructed to check blood sugar 2 times per day dx code E11.65 03/27/20   Philemon Kingdom, MD  hydrOXYzine (ATARAX/VISTARIL) 10 MG tablet TAKE ONE TABLET TWICE DAILY AND TWO TABLETS AT BEDTIME AS NEEDED, FOR GENERALIZED ITCHING 11/15/19   Fayrene Helper, MD  imipramine (TOFRANIL) 50 MG tablet TAKE 1 TABLET BY MOUTH EVERYDAY AT BEDTIME 10/29/20  Fayrene Helper, MD  injection device for insulin (CEQUR SIMPLICITY 2U) DEVI 1 each by Other route every 3 (three) days. 12/23/20   Philemon Kingdom, MD  Injection Device for Insulin (CEQUR SIMPLICITY INSERTER) MISC Use as advised RX: E11.59 12/24/20   Philemon Kingdom, MD  insulin aspart (NOVOLOG FLEXPEN) 100 UNIT/ML FlexPen Inject 10-15 Units into the skin 3 (three) times daily with meals. 12/03/20 12/03/21  Philemon Kingdom, MD  insulin glargine, 1 Unit Dial, (TOUJEO SOLOSTAR) 300 UNIT/ML Solostar Pen Inject 30-50 units two times daily 05/14/20   Philemon Kingdom, MD  Insulin Pen Needle 32G X 4 MM MISC Use 4x a day 12/28/18   Philemon Kingdom, MD  isosorbide mononitrate (IMDUR) 30 MG 24 hr tablet TAKE HALF A TABLET BY MOUTH EVERY DAY 04/09/20   Lorretta Harp, MD  Lancets (ACCU-CHEK MULTICLIX) lancets Use as instructed three times daily dx 250.01 05/07/13   Fayrene Helper, MD  levothyroxine (SYNTHROID) 137 MCG tablet TAKE 1 TABLET BY MOUTH DAILY BEFORE BREAKFAST. 11/04/20   Philemon Kingdom, MD  metFORMIN (GLUCOPHAGE) 1000 MG tablet TAKE 1 TABLET BY MOUTH TWICE A DAY 12/15/20   Fayrene Helper, MD  metoprolol tartrate (LOPRESSOR) 50 MG tablet Take 1 tablet (50 mg total) by mouth 2  (two) times daily. 04/09/20   Lorretta Harp, MD  PARoxetine (PAXIL) 20 MG tablet TAKE 1 TABLET BY MOUTH EVERY DAY 09/11/20   Fayrene Helper, MD  potassium chloride SA (KLOR-CON) 20 MEQ tablet Take 1 tablet (20 mEq total) by mouth daily. 11/06/19   Fayrene Helper, MD  pravastatin (PRAVACHOL) 80 MG tablet TAKE 1 TABLET BY MOUTH EVERY DAY 08/19/20   Fayrene Helper, MD  quinapril (ACCUPRIL) 10 MG tablet TAKE 1 TABLET BY MOUTH EVERY DAY 11/03/20   Fayrene Helper, MD  Semaglutide, 1 MG/DOSE, (OZEMPIC, 1 MG/DOSE,) 4 MG/3ML SOPN Inject 1 mg into the skin once a week. 08/07/20   Philemon Kingdom, MD  verapamil (CALAN-SR) 180 MG CR tablet TAKE 1 TABLET BY MOUTH EVERYDAY AT BEDTIME 04/09/20   Lorretta Harp, MD  vitamin B-12 (CYANOCOBALAMIN) 1000 MCG tablet Take 1 tablet (1,000 mcg total) by mouth daily. 08/20/14   Fayrene Helper, MD    Allergies    Lipitor [atorvastatin calcium], Daypro [oxaprozin], Nsaids, and Sulfonamide derivatives  Review of Systems   Review of Systems  Constitutional:  Negative for fever.  HENT:  Negative for sore throat.   Eyes:  Negative for visual disturbance.  Respiratory:  Negative for shortness of breath.   Cardiovascular:  Positive for chest pain and syncope.  Gastrointestinal:  Positive for nausea and vomiting. Negative for abdominal pain.  Genitourinary:  Negative for dysuria.  Musculoskeletal:  Negative for neck pain.  Skin:  Negative for rash.  Neurological:  Positive for dizziness.   Physical Exam Updated Vital Signs BP 107/89 (BP Location: Right Arm)   Pulse 63   Temp 98.6 F (37 C)   Resp 17   Ht 5\' 5"  (1.651 m)   Wt 84.8 kg   LMP 11/11/2016   SpO2 100%   BMI 31.12 kg/m   Physical Exam Vitals and nursing note reviewed.  Constitutional:      General: She is not in acute distress.    Appearance: Normal appearance. She is well-developed.  HENT:     Head: Normocephalic and atraumatic.  Eyes:     Conjunctiva/sclera:  Conjunctivae normal.  Cardiovascular:     Rate and Rhythm: Normal rate  and regular rhythm.     Heart sounds: No murmur heard. Pulmonary:     Effort: Pulmonary effort is normal. No respiratory distress.     Breath sounds: Normal breath sounds.  Abdominal:     Palpations: Abdomen is soft.     Tenderness: There is no abdominal tenderness. There is no guarding or rebound.  Musculoskeletal:        General: Tenderness present. No deformity. Normal range of motion.     Cervical back: Neck supple.     Comments: She is diffusely tender around her left knee.  There are no open wounds or effusion.  Skin:    General: Skin is warm and dry.  Neurological:     General: No focal deficit present.     Mental Status: She is alert.     Cranial Nerves: No cranial nerve deficit.     Sensory: No sensory deficit.     Motor: No weakness.     Gait: Gait normal.    ED Results / Procedures / Treatments   Labs (all labs ordered are listed, but only abnormal results are displayed) Labs Reviewed  BASIC METABOLIC PANEL - Abnormal; Notable for the following components:      Result Value   Sodium 132 (*)    Chloride 97 (*)    Glucose, Bld 174 (*)    Calcium 8.5 (*)    GFR, Estimated 60 (*)    All other components within normal limits  URINALYSIS, ROUTINE W REFLEX MICROSCOPIC - Abnormal; Notable for the following components:   Leukocytes,Ua MODERATE (*)    All other components within normal limits  HEPATIC FUNCTION PANEL - Abnormal; Notable for the following components:   Total Protein 8.3 (*)    All other components within normal limits  URINALYSIS, MICROSCOPIC (REFLEX) - Abnormal; Notable for the following components:   Bacteria, UA FEW (*)    All other components within normal limits  RESP PANEL BY RT-PCR (FLU A&B, COVID) [id number]  CBC  LIPASE, BLOOD  TROPONIN I (HIGH SENSITIVITY)  TROPONIN I (HIGH SENSITIVITY)    EKG EKG Interpretation  Date/Time:  Monday January 12 2021 16:48:33  EDT Ventricular Rate:  68 PR Interval:  158 QRS Duration: 76 QT Interval:  410 QTC Calculation: 435 R Axis:   3 Text Interpretation: Normal sinus rhythm Low voltage QRS Borderline ECG Confirmed by Aletta Edouard [phone]) on 01/12/2021 7:31:58 PM  Radiology CT Head Wo Contrast  Result Date: 01/12/2021 CLINICAL DATA:  Head trauma EXAM: CT HEAD WITHOUT CONTRAST TECHNIQUE: Contiguous axial images were obtained from the base of the skull through the vertex without intravenous contrast. COMPARISON:  Head CT dated May 14, 2019 FINDINGS: Brain: Chronic white matter ischemic change. No evidence of acute infarction, hemorrhage, hydrocephalus, extra-axial collection or mass lesion/mass effect. Vascular: No hyperdense vessel or unexpected calcification. Skull: Normal. Negative for fracture or focal lesion. Sinuses/Orbits: No acute finding. Other: None. IMPRESSION: No acute intracranial abnormality. Electronically Signed   By: Yetta Glassman M.D.   On: 01/12/2021 20:39   DG Knee Complete 4 Views Left  Result Date: 01/12/2021 CLINICAL DATA:  [place] yesterday, loss of consciousness EXAM: LEFT KNEE - COMPLETE 4+ VIEW COMPARISON:  None. FINDINGS: Joint space in the medial and lateral compartment. Associated osteophytosis of the medial compartment. No joint effusion. No acute fracture. Vascular calcifications noted IMPRESSION: 1. No acute fracture dislocation. 2. Degenerative changes as above. Electronically Signed   By: Suzy Bouchard M.D.   On: 01/12/2021 20:36  Procedures Procedures   Medications Ordered in ED Medications - No data to display  ED Course  I have reviewed the triage vital signs and the nursing notes.  Pertinent labs & imaging results that were available during my care of the patient were reviewed by me and considered in my medical decision making (see chart for details).    MDM Rules/Calculators/A&P                          This patient complains of syncopal event  yesterday in the setting of upper abdominal pain, now with some chest soreness left knee pain ; this involves an extensive number of treatment Options and is a complaint that carries with it a high risk of complications and Morbidity. The differential includes syncope, vasovagal, arrhythmia, anemia, metabolic derangement, ACS  I ordered, reviewed and interpreted labs, which included CBC with normal white count normal hemoglobin, chemistries with mildly low sodium, elevated glucose, urinalysis without clear signs of infection, COVID and flu negative, troponins flat  I ordered imaging studies which included head CT and left knee x-ray and I independently    visualized and interpreted imaging which showed no acute findings Previous records obtained and reviewed in epic no recent admissions  After the interventions stated above, I reevaluated the patient and found patient to be hemodynamically stable.  She is asymptomatic currently.  Recommended close follow-up with her primary care doctor.  Return instructions discussed   Final Clinical Impression(s) / ED Diagnoses Final diagnoses:  Syncope and collapse  Nonspecific chest pain    Rx / DC Orders ED Discharge Orders     None        Hayden Rasmussen, MD 01/13/21 1028

## 2021-01-12 NOTE — ED Provider Notes (Signed)
Emergency Medicine Provider Triage Evaluation Note  Jennifer Blackburn , a 75 y.o. female  was evaluated in triage.  Pt complains of syncope. Reports feeling unwell for the last several days. Recently had syncope, nv, epigastric abd pain.  Review of Systems  Positive: Syncope, abd pain, nv, knee pain Negative: Urinary sxs  Physical Exam  BP 107/89 (BP Location: Right Arm)   Pulse 63   Temp 98.6 F (37 C)   Resp 17   Ht 5\' 5"  (1.651 m)   Wt 84.8 kg   LMP 11/11/2016   SpO2 100%   BMI 31.12 kg/m  Gen:   Awake, no distress   Resp:  Normal effort  MSK:   Moves extremities without difficulty  Other:  Epigastric ttp, no cspine ttp, ttp to the left knee  Medical Decision Making  Medically screening exam initiated at 6:52 PM.  Appropriate orders placed.  KELLYN MCCARY was informed that the remainder of the evaluation will be completed by another provider, this initial triage assessment does not replace that evaluation, and the importance of remaining in the ED until their evaluation is complete.     Bishop Dublin 01/12/21 1852    Hayden Rasmussen, MD 01/13/21 1029

## 2021-01-12 NOTE — ED Triage Notes (Signed)
Emesis x 3 early Sunday morning.

## 2021-01-12 NOTE — Discharge Instructions (Signed)
You are seen in the emergency department for evaluation of injuries after a fainting episode yesterday at home.  You had lab work EKG CT of your head and x-rays of your left knee that did not show any acute findings.  It would be important for you to stay well-hydrated and follow-up with your primary care doctor.  Return to the emergency department if any worsening or concerning symptoms.

## 2021-01-13 ENCOUNTER — Ambulatory Visit: Payer: Medicare HMO | Admitting: Family Medicine

## 2021-01-13 ENCOUNTER — Ambulatory Visit (HOSPITAL_COMMUNITY)
Admission: RE | Admit: 2021-01-13 | Discharge: 2021-01-13 | Disposition: A | Discharge status: Home / Self Care | Payer: Medicare HMO | Source: Ambulatory Visit | Attending: Nurse Practitioner | Admitting: Nurse Practitioner

## 2021-01-13 DIAGNOSIS — N6489 Other specified disorders of breast: Secondary | ICD-10-CM | POA: Diagnosis not present

## 2021-01-13 DIAGNOSIS — R928 Other abnormal and inconclusive findings on diagnostic imaging of breast: Secondary | ICD-10-CM | POA: Diagnosis not present

## 2021-01-13 DIAGNOSIS — N6311 Unspecified lump in the right breast, upper outer quadrant: Secondary | ICD-10-CM | POA: Diagnosis not present

## 2021-01-16 ENCOUNTER — Other Ambulatory Visit: Payer: Self-pay | Admitting: Family Medicine

## 2021-01-19 ENCOUNTER — Telehealth: Payer: Self-pay

## 2021-01-19 NOTE — Telephone Encounter (Signed)
Patient called said she fell a week ago Sunday morning, went to ER last Monday, asking if Dr Moshe Cipro can order a scan on her left knee, the xray showed normal.  She will see Dr Moshe Cipro Wednesday. Can patient have a scan done before Wednesday, so Dr Moshe Cipro can go over results Wednesday at her appointment. Call back # [phone].

## 2021-01-21 ENCOUNTER — Encounter: Payer: Self-pay | Admitting: Family Medicine

## 2021-01-21 ENCOUNTER — Ambulatory Visit (INDEPENDENT_AMBULATORY_CARE_PROVIDER_SITE_OTHER): Payer: Medicare HMO | Admitting: Family Medicine

## 2021-01-21 ENCOUNTER — Other Ambulatory Visit: Payer: Self-pay

## 2021-01-21 VITALS — BP 107/68 | HR 64 | Resp 18 | Ht 65.0 in | Wt 188.0 lb

## 2021-01-21 DIAGNOSIS — M25562 Pain in left knee: Secondary | ICD-10-CM | POA: Diagnosis not present

## 2021-01-21 DIAGNOSIS — E782 Mixed hyperlipidemia: Secondary | ICD-10-CM | POA: Diagnosis not present

## 2021-01-21 DIAGNOSIS — I10 Essential (primary) hypertension: Secondary | ICD-10-CM

## 2021-01-21 DIAGNOSIS — Z23 Encounter for immunization: Secondary | ICD-10-CM

## 2021-01-21 DIAGNOSIS — E1159 Type 2 diabetes mellitus with other circulatory complications: Secondary | ICD-10-CM | POA: Diagnosis not present

## 2021-01-21 DIAGNOSIS — E559 Vitamin D deficiency, unspecified: Secondary | ICD-10-CM

## 2021-01-21 NOTE — Patient Instructions (Addendum)
Annual exam in 3 months in office call if you need me sooner  Flu vaccine today'   You are referred to Pharmacist for blood glucose test supplies  Labs this week fasting lipid, chem 7  and eGFr  and vit D and microalb  Tylenol 500 mg two tabs 2 to 3 times daily for next 5 to 7 days, and ice compression to knee 2 to 3 times daily If pain persists or worsens in next 1 week, p,ld call for Ortho referral   No symptom or sign of blood clot  Be careful not to fall, and watch what you eat!  Thanks for choosing [facility] we consider it a privelige to serve you.

## 2021-01-22 NOTE — Telephone Encounter (Signed)
Noted  

## 2021-01-22 NOTE — Telephone Encounter (Signed)
Patient called regarding Patient Assistance - asked patient and found that she has not been able to reach them because of call volume at [employer].  I recommended calling and holding until a representative answered because their call volume is higher than expected. She had only been holding for them 5/7 minutes before hanging up. Patient is going to call them back and wait for assistance now that she knows that they will answer given time.

## 2021-01-23 ENCOUNTER — Telehealth: Payer: Self-pay | Admitting: *Deleted

## 2021-01-23 NOTE — Chronic Care Management (AMB) (Signed)
  Chronic Care Management   Note  01/23/2021 Name: Jennifer Blackburn MRN: [id number] DOB: [birth year]  Jennifer Blackburn is a 75 y.o. year old female who is a primary care patient of Moshe Cipro Norwood Levo, MD. I reached out to Jennifer Blackburn by phone today in response to a referral sent by [name]'s PCP.  [name] was given information about Chronic Care Management services today including:  CCM service includes personalized support from designated clinical staff supervised by her physician, including individualized plan of care and coordination with other care providers 24/7 contact phone numbers for assistance for urgent and routine care needs. Service will only be billed when office clinical staff spend 20 minutes or more in a month to coordinate care. Only one practitioner may furnish and bill the service in a calendar month. The patient may stop CCM services at any time (effective at the end of the month) by phone call to the office staff. The patient is responsible for co-pay (up to 20% after annual deductible is met) if co-pay is required by the individual health plan.   Patient agreed to services and verbal consent obtained.   Follow up plan: Telephone appointment with care management team member scheduled for:02/03/21  [facility]  Direct Dial: [phone]

## 2021-01-25 ENCOUNTER — Encounter: Payer: Self-pay | Admitting: Family Medicine

## 2021-01-25 DIAGNOSIS — M25562 Pain in left knee: Secondary | ICD-10-CM | POA: Insufficient documentation

## 2021-01-25 NOTE — Assessment & Plan Note (Signed)
Hyperlipidemia:Low fat diet discussed and encouraged.   Lipid Panel  Lab Results  Component Value Date   CHOL 179 07/18/2020   HDL 40 07/18/2020   LDLCALC 110 (H) 07/18/2020   TRIG 164 (H) 07/18/2020   CHOLHDL 4.5 (H) 07/18/2020   Not at goal , needs to reduce fried and fatty foods

## 2021-01-25 NOTE — Progress Notes (Signed)
Jennifer Blackburn     MRN: [id number]      DOB: September 28, 1945   HPI Jennifer Blackburn is here for follow up of recent ED visit with c/o left knee pain following a fall.on 01/10/2021. Pain is lessening slowly, has concerns of blood clot. No shortness of breath or hemoptysis Having difficulty getting test supplies ,has uncontrolled diabetes and is on insulin, will refer for help through pharmacy program   ROS Denies recent fever or chills. Denies sinus pressure, nasal congestion, ear pain or sore throat. Denies chest congestion, productive cough or wheezing. Denies chest pains, palpitations and leg swelling Denies abdominal pain, nausea, vomiting,diarrhea or constipation.   Denies dysuria, frequency, hesitancy or incontinence.  Denies headaches, seizures, numbness, or tingling. Denies depression, anxiety or insomnia. Denies skin break down or rash.   PE  BP 107/68   Pulse 64   Resp 18   Ht 5\' 5"  (1.651 m)   Wt 188 lb 0.6 oz (85.3 kg)   LMP 11/11/2016   SpO2 95%   BMI 31.29 kg/m   Patient alert and oriented and in no cardiopulmonary distress.  HEENT: No facial asymmetry, EOMI,     Neck supple .  Chest: Clear to auscultation bilaterally.  CVS: S1, S2 no murmurs, no S3.Regular rate.  ABD: Soft non tender.   Ext: No edema  MS: Adequate ROM spine, shoulders, hips and reduced in left  knee.  Skin: Intact, no ulcerations or rash noted.  Psych: Good eye contact, normal affect. Memory intact not anxious or depressed appearing.  CNS: CN 2-12 intact, power,  normal throughout.no focal deficits noted.   Assessment & Plan  Left knee pain No fracture, ice, tylenol and rest, call if persists for ortho eval  Hypertension Controlled, no change in medication DASH diet and commitment to daily physical activity for a minimum of 30 minutes discussed and encouraged, as a part of hypertension management. The importance of attaining a healthy weight is also discussed.  BP/Weight  01/21/2021 01/12/2021 12/25/2020 12/23/2020 11/05/2020 09/23/2020 9/51/8841  Systolic BP 660 630 160 109 323 557 322  Diastolic BP 68 52 72 68 66 70 72  Wt. (Lbs) 188.04 187 191.12 195 187.2 185 184.6  BMI 31.29 31.12 31.8 32.45 31.15 30.79 30.72       Hyperlipidemia Hyperlipidemia:Low fat diet discussed and encouraged.   Lipid Panel  Lab Results  Component Value Date   CHOL 179 07/18/2020   HDL 40 07/18/2020   LDLCALC 110 (H) 07/18/2020   TRIG 164 (H) 07/18/2020   CHOLHDL 4.5 (H) 07/18/2020   Not at goal , needs to reduce fried and fatty foods    Type 2 diabetes mellitus with vascular disease ([place]) Jennifer Blackburn is reminded of the importance of commitment to daily physical activity for 30 minutes or more, as able and the need to limit carbohydrate intake to 30 to 60 grams per meal to help with blood sugar control.   The need to take medication as prescribed, test blood sugar as directed, and to call between visits if there is a concern that blood sugar is uncontrolled is also discussed.   Jennifer Blackburn is reminded of the importance of daily foot exam, annual eye examination, and good blood sugar, blood pressure and cholesterol control. uncontrolled  Diabetic Labs Latest Ref Rng & Units 01/12/2021 12/23/2020 08/07/2020 07/18/2020 05/09/2020  HbA1c 4.0 - 5.6 % - 10.1(A) 9.0(A) - 10.5(A)  Microalbumin mg/dL - - - - -  Micro/Creat Ratio 0.0 - 30.0  mg/g creat - - - - -  Chol 100 - 199 mg/dL - - - 179 -  HDL >39 mg/dL - - - 40 -  Calc LDL 0 - 99 mg/dL - - - 110(H) -  Triglycerides 0 - 149 mg/dL - - - 164(H) -  Creatinine 0.44 - 1.00 mg/dL 0.98 - - 0.83 -   BP/Weight 01/21/2021 01/12/2021 12/25/2020 12/23/2020 11/05/2020 09/23/2020 7/95/3692  Systolic BP 230 097 949 971 820 990 689  Diastolic BP 68 52 72 68 66 70 72  Wt. (Lbs) 188.04 187 191.12 195 187.2 185 184.6  BMI 31.29 31.12 31.8 32.45 31.15 30.79 30.72   Foot/eye exam completion dates Latest Ref Rng & Units 01/21/2021 10/28/2020  Eye Exam  No Retinopathy - No Retinopathy  Foot exam Order - - -  Foot Form Completion - Done -

## 2021-01-25 NOTE — Assessment & Plan Note (Signed)
No fracture, ice, tylenol and rest, call if persists for ortho eval

## 2021-01-25 NOTE — Assessment & Plan Note (Signed)
Controlled, no change in medication DASH diet and commitment to daily physical activity for a minimum of 30 minutes discussed and encouraged, as a part of hypertension management. The importance of attaining a healthy weight is also discussed.  BP/Weight 01/21/2021 01/12/2021 12/25/2020 12/23/2020 11/05/2020 09/23/2020 2/91/9166  Systolic BP 060 045 997 741 423 953 202  Diastolic BP 68 52 72 68 66 70 72  Wt. (Lbs) 188.04 187 191.12 195 187.2 185 184.6  BMI 31.29 31.12 31.8 32.45 31.15 30.79 30.72

## 2021-01-25 NOTE — Assessment & Plan Note (Signed)
Jennifer Blackburn is reminded of the importance of commitment to daily physical activity for 30 minutes or more, as able and the need to limit carbohydrate intake to 30 to 60 grams per meal to help with blood sugar control.   The need to take medication as prescribed, test blood sugar as directed, and to call between visits if there is a concern that blood sugar is uncontrolled is also discussed.   Jennifer Blackburn is reminded of the importance of daily foot exam, annual eye examination, and good blood sugar, blood pressure and cholesterol control. uncontrolled  Diabetic Labs Latest Ref Rng & Units 01/12/2021 12/23/2020 08/07/2020 07/18/2020 05/09/2020  HbA1c 4.0 - 5.6 % - 10.1(A) 9.0(A) - 10.5(A)  Microalbumin mg/dL - - - - -  Micro/Creat Ratio 0.0 - 30.0 mg/g creat - - - - -  Chol 100 - 199 mg/dL - - - 179 -  HDL >39 mg/dL - - - 40 -  Calc LDL 0 - 99 mg/dL - - - 110(H) -  Triglycerides 0 - 149 mg/dL - - - 164(H) -  Creatinine 0.44 - 1.00 mg/dL 0.98 - - 0.83 -   BP/Weight 01/21/2021 01/12/2021 12/25/2020 12/23/2020 11/05/2020 09/23/2020 11/15/784  Systolic BP 754 492 010 071 219 758 832  Diastolic BP 68 52 72 68 66 70 72  Wt. (Lbs) 188.04 187 191.12 195 187.2 185 184.6  BMI 31.29 31.12 31.8 32.45 31.15 30.79 30.72   Foot/eye exam completion dates Latest Ref Rng & Units 01/21/2021 10/28/2020  Eye Exam No Retinopathy - No Retinopathy  Foot exam Order - - -  Foot Form Completion - Done -

## 2021-01-28 DIAGNOSIS — E1159 Type 2 diabetes mellitus with other circulatory complications: Secondary | ICD-10-CM | POA: Diagnosis not present

## 2021-01-28 DIAGNOSIS — E782 Mixed hyperlipidemia: Secondary | ICD-10-CM | POA: Diagnosis not present

## 2021-01-28 DIAGNOSIS — E559 Vitamin D deficiency, unspecified: Secondary | ICD-10-CM | POA: Diagnosis not present

## 2021-01-30 LAB — BMP8+EGFR
BUN/Creatinine Ratio: 16 (ref 12–28)
BUN: 15 mg/dL (ref 8–27)
CO2: 28 mmol/L (ref 20–29)
Calcium: 9 mg/dL (ref 8.7–10.3)
Chloride: 99 mmol/L (ref 96–106)
Creatinine, Ser: 0.92 mg/dL (ref 0.57–1.00)
Glucose: 129 mg/dL — ABNORMAL HIGH (ref 70–99)
Potassium: 4.7 mmol/L (ref 3.5–5.2)
Sodium: 141 mmol/L (ref 134–144)
eGFR: 65 mL/min/{1.73_m2} (ref >59)

## 2021-01-30 LAB — LIPID PANEL
Chol/HDL Ratio: 4 ratio (ref 0.0–4.4)
Cholesterol, Total: 171 mg/dL (ref 100–199)
HDL: 43 mg/dL (ref >39)
LDL Chol Calc (NIH): 105 mg/dL — ABNORMAL HIGH (ref 0–99)
Triglycerides: 126 mg/dL (ref 0–149)
VLDL Cholesterol Cal: 23 mg/dL (ref 5–40)

## 2021-01-30 LAB — MICROALBUMIN / CREATININE URINE RATIO
Creatinine, Urine: 59 mg/dL
Microalb/Creat Ratio: 13 mg/g creat (ref 0–29)
Microalbumin, Urine: 7.9 ug/mL

## 2021-01-30 LAB — VITAMIN D 25 HYDROXY (VIT D DEFICIENCY, FRACTURES): Vit D, 25-Hydroxy: 36.4 ng/mL (ref 30.0–100.0)

## 2021-02-02 ENCOUNTER — Telehealth: Payer: Self-pay

## 2021-02-02 DIAGNOSIS — M25562 Pain in left knee: Secondary | ICD-10-CM

## 2021-02-02 NOTE — Telephone Encounter (Signed)
Patient called still having trouble from her knees and needs a referral to [facility] in [facility] Dr Ernestina Patches for sharp knee pain. Just seen Dr Moshe Cipro 11/2.  Call back # [phone].

## 2021-02-03 ENCOUNTER — Ambulatory Visit (INDEPENDENT_AMBULATORY_CARE_PROVIDER_SITE_OTHER): Payer: Medicare HMO | Admitting: Pharmacist

## 2021-02-03 DIAGNOSIS — E782 Mixed hyperlipidemia: Secondary | ICD-10-CM

## 2021-02-03 DIAGNOSIS — I2583 Coronary atherosclerosis due to lipid rich plaque: Secondary | ICD-10-CM

## 2021-02-03 DIAGNOSIS — E1159 Type 2 diabetes mellitus with other circulatory complications: Secondary | ICD-10-CM

## 2021-02-03 DIAGNOSIS — I2510 Atherosclerotic heart disease of native coronary artery without angina pectoris: Secondary | ICD-10-CM

## 2021-02-03 DIAGNOSIS — I10 Essential (primary) hypertension: Secondary | ICD-10-CM

## 2021-02-03 NOTE — Addendum Note (Signed)
Addended by: Eual Fines on: 02/03/2021 08:59 AM   Modules accepted: Orders

## 2021-02-03 NOTE — Chronic Care Management (AMB) (Signed)
Chronic Care Management Pharmacy Note  02/03/2021 Name:  Jennifer Blackburn MRN:  [id number] DOB:  March 14, 1946  Summary:  Type 2 Diabetes Followed with Dr. Cruzita Lederer (endocrinology) Patient is approved for patient assistance through [place] for [place]. Prescription for Ozempic 1 mg per week from endocrinology has been received and is in the process of being shipped. This medication will be delivered to provider office hopefully within the next 2 weeks. However, there are currently shortages of several semaglutide products that may cause a delay.  Shingrix: overdue; series not started. Patient can receive at their pharmacy.  TDAP: overdue (last 2012). Patient can receive at their pharmacy. Current glucose readings: patient current checks blood glucose twice daily (fasting and after lunch/before dinner); fasting blood glucose:  119, 128, 130, 143 ; post prandial glucose:  198, 202, 210 Discussed diet and how to improved by eating less cereal, bread, fruit and milk Continue current medications per endocrinology Patient identified as a good candidate for continuous glucose monitor. Recommend Dexcom G6. Will send prescription to DME. Will need reader device. Will have patient call me when she receives and we can setup appointment to place continuous glucose monitor and complete training.   Coronary artery disease/Hyperlipidemia Uncontrolled. LDL above goal of <70 due to very high risk given 10-year risk >20% per 2020 AACE/ACE guidelines. Triglycerides at goal of <150 per 2020 AACE/ACE guidelines. Since LDL remains above goal, consider addition of ezetimibe 10 mg by mouth once daily. Will recommend to PCP.   Subjective: Jennifer Blackburn is an 75 y.o. year old female who is a primary patient of Moshe Cipro, Norwood Levo, MD.  The CCM team was consulted for assistance with disease management and care coordination needs.    Engaged with patient by telephone for initial visit in response to provider  referral for pharmacy case management and/or care coordination services.   Consent to Services:  The patient was given the following information about Chronic Care Management services today, agreed to services, and gave verbal consent: 1. CCM service includes personalized support from designated clinical staff supervised by the primary care provider, including individualized plan of care and coordination with other care providers 2. 24/7 contact phone numbers for assistance for urgent and routine care needs. 3. Service will only be billed when office clinical staff spend 20 minutes or more in a month to coordinate care. 4. Only one practitioner may furnish and bill the service in a calendar month. 5.The patient may stop CCM services at any time (effective at the end of the month) by phone call to the office staff. 6. The patient will be responsible for cost sharing (co-pay) of up to 20% of the service fee (after annual deductible is met). Patient agreed to services and consent obtained.  Patient Care Team: Fayrene Helper, MD as PCP - General Gwenlyn Found Pearletha Forge, MD as PCP - Cardiology (Cardiology) Sherlynn Stalls, MD (Ophthalmology) Lorretta Harp, MD as Consulting Physician (Cardiology) Ronnald Collum, Lourdes Sledge, MD as Attending Physician (Endocrinology) Gala Romney Cristopher Estimable, MD as Consulting Physician (Gastroenterology) Cassandria Anger, MD as Consulting Physician (Endocrinology) Beryle Lathe, [facility] (Pharmacist)  Objective:  Lab Results  Component Value Date   CREATININE 0.92 01/28/2021   CREATININE 0.98 01/12/2021   CREATININE 0.83 07/18/2020    Lab Results  Component Value Date   HGBA1C 10.1 (A) 12/23/2020   Last diabetic Eye exam:  Lab Results  Component Value Date/Time   HMDIABEYEEXA No Retinopathy 10/28/2020 12:00 AM    Last  diabetic Foot exam:  Lab Results  Component Value Date/Time   HMDIABFOOTEX yes 09/24/1999 12:00 AM        Component Value Date/Time   CHOL  171 01/28/2021 1147   TRIG 126 01/28/2021 1147   HDL 43 01/28/2021 1147   CHOLHDL 4.0 01/28/2021 1147   CHOLHDL 3.7 09/04/2019 1043   VLDL 30 07/13/2016 1020   LDLCALC 105 (H) 01/28/2021 1147   LDLCALC 91 09/04/2019 1043    Hepatic Function Latest Ref Rng & Units 01/12/2021 07/18/2020 05/07/2019  Total Protein 6.5 - 8.1 g/dL 8.3(H) 7.6 7.8  Albumin 3.5 - 5.0 g/dL 4.1 4.2 -  AST 15 - 41 U/L _0 ALT 0 - 44 U/L _1 Alk Phosphatase 38 - 126 U/L 43 50 -  Total Bilirubin 0.3 - 1.2 mg/dL 0.4 0.2 0.3  Bilirubin, Direct 0.0 - 0.2 mg/dL <0.1 - -    Lab Results  Component Value Date/Time   TSH 0.40 08/07/2020 04:39 PM   TSH 1.60 05/09/2020 02:28 PM   FREET4 1.19 08/07/2020 04:39 PM   FREET4 1.22 05/09/2020 02:28 PM    CBC Latest Ref Rng & Units 01/12/2021 07/18/2020 05/07/2019  WBC 4.0 - 10.5 K/uL 8.0 7.5 7.5  Hemoglobin 12.0 - 15.0 g/dL 12.0 12.4 12.2  Hematocrit 36.0 - 46.0 % 38.9 38.3 38.3  Platelets 150 - 400 K/uL 307 - 283    Lab Results  Component Value Date/Time   VD25OH 36.4 01/28/2021 11:47 AM   VD25OH 38.9 07/18/2020 11:20 AM    Clinical ASCVD: No  The 10-year ASCVD risk score (Arnett DK, et al., 2019) is: 22.1%   Values used to calculate the score:     Age: 75 years     Sex: Female     Is Non-Hispanic African American: Yes     Diabetic: Yes     Tobacco smoker: No     Systolic Blood Pressure: 546 mmHg     Is BP treated: Yes     HDL Cholesterol: 43 mg/dL     Total Cholesterol: 171 mg/dL    Social History   Tobacco Use  Smoking Status Never   Passive exposure: Yes  Smokeless Tobacco Never  Tobacco Comments   Husband smokes in the home   BP Readings from Last 3 Encounters:  01/21/21 107/68  01/12/21 (!) 101/52  12/25/20 130/72   Pulse Readings from Last 3 Encounters:  01/21/21 64  01/12/21 67  12/25/20 65   Wt Readings from Last 3 Encounters:  01/21/21 188 lb 0.6 oz (85.3 kg)  01/12/21 187 lb (84.8 kg)  12/25/20 191 lb 1.9 oz (86.7 kg)     Assessment: Review of patient past medical history, allergies, medications, health status, including review of consultants reports, laboratory and other test data, was performed as part of comprehensive evaluation and provision of chronic care management services.   SDOH:  (Social Determinants of Health) assessments and interventions performed:    CCM Care Plan  Allergies  Allergen Reactions   Lipitor [Atorvastatin Calcium] Other (See Comments)    Muscle aches   Actos [Pioglitazone] Other (See Comments)    Peripheral edema   Crestor [Rosuvastatin] Other (See Comments)    Muscle aches   Daypro [Oxaprozin] Hives   Nsaids Hives   Sulfonamide Derivatives Hives    Medications Reviewed Today     Reviewed by Beryle Lathe, [facility] (Pharmacist) on 02/03/21 at 1516  Med List Status: <None>   Medication Order  Taking? Sig Documenting Provider Last Dose Status Informant  acetaminophen (TYLENOL) 500 MG tablet [id number] Yes Take 1,000 mg by mouth as needed (for pain.). [provider] Taking Active Self  Cetirizine HCl 10 MG TBDP [id number] Yes Take one tablet by mouth two times daily for allergy  Patient taking differently: Take 1 tablet by mouth daily.   Fayrene Helper, MD Taking Active Self  Cholecalciferol (VITAMIN D3) 50 MCG (2000 UT) TABS [id number] Yes Take 2,000 Units by mouth daily. [provider] Taking Active Self  furosemide (LASIX) 20 MG tablet [id number] Yes TAKE 1 TABLET BY MOUTH EVERY DAY Fayrene Helper, MD Taking Active   gabapentin (NEURONTIN) 100 MG capsule [id number] Yes TAKE 1 CAPSULE BY MOUTH 3 TIMES A DAY Fayrene Helper, MD Taking Active            Med Note Beryle Lathe   Tue Feb 03, 2021  3:15 PM) Only takes 1-2 times per day  glucose blood (ACCU-CHEK GUIDE) test strip [id number]  1 each by Other route as needed for other. Use as instructed to check blood sugar 2 times per day dx code E11.65 Philemon Kingdom, MD  Active    hydrOXYzine (ATARAX/VISTARIL) 10 MG tablet [id number] Yes TAKE ONE TABLET TWICE DAILY AND TWO TABLETS AT BEDTIME AS NEEDED, FOR GENERALIZED ITCHING Fayrene Helper, MD Taking Active   imipramine (TOFRANIL) 50 MG tablet [id number] Yes TAKE 1 TABLET BY MOUTH EVERYDAY AT BEDTIME Fayrene Helper, MD Taking Active   insulin aspart (NOVOLOG FLEXPEN) 100 UNIT/ML FlexPen [id number] Yes Inject 10-15 Units into the skin 3 (three) times daily with meals.  Patient taking differently: Inject 15 Units into the skin 3 (three) times daily with meals.   Philemon Kingdom, MD Taking Active   insulin glargine, 1 Unit Dial, (TOUJEO SOLOSTAR) 300 UNIT/ML Solostar Pen [id number] Yes Inject 30-50 units two times daily  Patient taking differently: Inject 30 units every morning and 40 units every evening   Philemon Kingdom, MD Taking Active   Insulin Pen Needle 32G X 4 MM MISC [id number]  Use 4x a day Philemon Kingdom, MD  Active Self  isosorbide mononitrate (IMDUR) 30 MG 24 hr tablet [id number] Yes TAKE HALF A TABLET BY MOUTH EVERY DAY Lorretta Harp, MD Taking Active   Lancets [facility] MULTICLIX) lancets [id number]  Use as instructed three times daily dx 250.01 Fayrene Helper, MD  Active Self  levothyroxine (SYNTHROID) 137 MCG tablet [id number] Yes TAKE 1 TABLET BY MOUTH DAILY BEFORE BREAKFAST. Philemon Kingdom, MD Taking Active   metFORMIN (GLUCOPHAGE) 1000 MG tablet [id number] Yes TAKE 1 TABLET BY MOUTH TWICE A DAY Fayrene Helper, MD Taking Active   metoprolol tartrate (LOPRESSOR) 50 MG tablet [id number] Yes Take 1 tablet (50 mg total) by mouth 2 (two) times daily. Lorretta Harp, MD Taking Active   PARoxetine (PAXIL) 20 MG tablet [id number] Yes TAKE 1 TABLET BY MOUTH EVERY DAY Fayrene Helper, MD Taking Active   potassium chloride SA (KLOR-CON) 20 MEQ tablet [id number] Yes Take 1 tablet (20 mEq total) by mouth daily. Fayrene Helper, MD Taking Active   pravastatin (PRAVACHOL) 80 MG tablet  [id number] Yes TAKE 1 TABLET BY MOUTH EVERY DAY Fayrene Helper, MD Taking Active   quinapril (ACCUPRIL) 10 MG tablet [id number] Yes TAKE 1 TABLET BY MOUTH EVERY DAY Fayrene Helper, MD Taking Active   Semaglutide, 1 MG/DOSE, (OZEMPIC, 1 MG/DOSE,) 4 MG/3ML SOPN [id number] No Inject 1 mg into the skin  once a week.  Patient not taking: No sig reported   Philemon Kingdom, MD Not Taking Active   verapamil (CALAN-SR) 180 MG CR tablet [id number] Yes TAKE 1 TABLET BY MOUTH EVERYDAY AT BEDTIME Lorretta Harp, MD Taking Active   vitamin B-12 (CYANOCOBALAMIN) 1000 MCG tablet [id number] Yes Take 1 tablet (1,000 mcg total) by mouth daily. Fayrene Helper, MD Taking Active Self            Patient Active Problem List   Diagnosis Date Noted   Left knee pain 01/25/2021   Breast mass, right 12/25/2020   Conjunctivitis 09/23/2020   Bloating 06/26/2020   Nausea without vomiting 06/26/2020   Constipation 06/06/2020   Belching 06/06/2020   History of gastric ulcer 05/07/2019   Muscle pain 02/07/2019   Fatigue 02/07/2019   Dysphagia 11/11/2018   History of colonic polyps 11/11/2018   Obesity (BMI 30.0-34.9) 11/05/2018   Cervical spondylosis with radiculopathy 06/20/2017   Hypertension    Hyperlipidemia    Depression    CAD (coronary artery disease)    Anxiety    Osteoarthritis 02/17/2017   Family history of coronary artery disease in father 02/17/2017   Lumbar spondylosis with myelopathy 07/18/2016   Laryngopharyngeal reflux (LPR) 06/24/2016   Type 2 diabetes mellitus with vascular disease ([place]) 01/13/2015   Insomnia due to stress 08/20/2014   GERD (gastroesophageal reflux disease) 04/11/2014   At high risk for falls 12/31/2013   Postsurgical hypothyroidism 01/05/2013   Snoring 01/05/2013   Back pain with radiculopathy 11/28/2012   Anxiety and depression 10/16/2011   Anemia 03/30/2011   Posterior right knee pain 01/24/2011   Vitamin D deficiency 10/14/2010   Bilateral carotid  bruits 10/13/2010   Allergic rhinitis 11/23/2007   CARPAL TUNNEL SYNDROME, BILATERAL 04/14/2007   Coronary atherosclerosis 04/14/2007   Headache 04/14/2007   Thyroid cancer ([place]) 03/23/1999    Immunization History  Administered Date(s) Administered   Fluad Quad(high Dose 65+) 12/18/2018, 12/25/2019, 01/21/2021   Influenza Split 03/10/2012   Influenza Whole 01/04/2007   Influenza,inj,Quad PF,6+ Mos 11/28/2012, 12/31/2013, 01/31/2017, 01/10/2018   Influenza,inj,quad, With Preservative 12/20/2016   Influenza-Unspecified 02/09/2016   Moderna SARS-COV2 Booster Vaccination 01/24/2020   Moderna Sars-Covid-2 Vaccination 04/29/2019, 05/30/2019, 01/03/2021   Pneumococcal Conjugate-13 07/15/2014   Pneumococcal Polysaccharide-23 02/17/2004, 09/07/2012   Tdap 12/21/2010    Conditions to be addressed/monitored: CAD, HTN, HLD, and DMII  Care Plan : Medication Management  Updates made by Beryle Lathe, [place] since 02/03/2021 12:00 AM     Problem: T2DM, CAD/HLD, HTN   Priority: High  Onset Date: 02/03/2021     Long-Range Goal: Disease Progression Prevention   Start Date: 02/03/2021  Expected End Date: 05/04/2021  This Visit's Progress: On track  Priority: High  Note:   Current Barriers:  Unable to independently monitor therapeutic efficacy Unable to achieve control of diabetes and hyperlipidemia  Pharmacist Clinical Goal(s):  Patient will Achieve adherence to monitoring guidelines and medication adherence to achieve therapeutic efficacy Achieve control of diabetes and hyperlipidemia as evidenced by improved fasting blood sugar, improved A1c, and improved LDL through collaboration with PharmD and provider.   Interventions: 1:1 collaboration with Fayrene Helper, MD regarding development and update of comprehensive plan of care as evidenced by provider attestation and co-signature Inter-disciplinary care team collaboration (see longitudinal plan of care) Comprehensive  medication review performed; medication list updated in electronic medical record  Type 2 Diabetes - New goal.: Followed with Dr. Cruzita Lederer (endocrinology) Uncontrolled; Most recent A1c above goal of <  7% per ADA guidelines Current medications: metformin 1,000 mg by mouth twice daily with meals and insulin glargine (Toujeo) 30 units every morning and 40 units every evening and insulin aspart (Novolog) 15 units subcutaneously 5-15 minutes before each meal Patient is approved for patient assistance through [place] for [place]. Prescription for Ozempic 1 mg per week from endocrinology has been received and is in the process of being shipped. This medication will be delivered to provider office hopefully within the next 2 weeks. However, there are currently shortages of several semaglutide products that may cause a delay.  Intolerances: pioglitazone (swelling) and glipizide (lack of efficacy) Taking medications as directed: yes Side effects thought to be attributed to current medication regimen: maybe, is having some GI upset/diarrhea potentially from metformin Denies hypoglycemic symptoms (sweaty and shaky). Denies hyperglycemic symptoms (excessive thirst, excessive hunger, and excessive urination. Hypoglycemia prevention: not indicated at this time Current meal patterns: breakfast: eggs and 1 piece of toast and sometimes some fruit (banana) or sometimes cereal ; lunch:  1 piece of toast with can of peas or carrot or other vegetable ; dinner: baked chicken or pork chops or fried fish with beans or vegetables with bread; snacks: crackers, vegetables, and fruit ; beverages: water, skim milk, and coffee with creamer (not sugar free); will sometimes use Stevia in her tea Current exercise: not active due to injury from recent falls On a statin: yes On aspirin 81 mg daily: no Last microalbumin: 13 (01/28/21); on an ACEi/ARB: yes Last eye exam: completed within last year Last foot exam: completed within last  year Pneumonia vaccine: series complete Influenza vaccine: up to date Shingrix: overdue; series not started. Patient can receive at their pharmacy.  TDAP: overdue (last 2012). Patient can receive at their pharmacy. Current glucose readings: patient current checks blood glucose twice daily (fasting and after lunch/before dinner); fasting blood glucose:  119, 128, 130, 143 ; post prandial glucose:  198, 202, 210 Discussed diet and how to improved by eating less cereal, bread, fruit and milk Continue current medications as above per endocrinology Patient identified as a good candidate for continuous glucose monitor. Recommend Dexcom G6. Will send prescription to DME. Will need reader device. Will have patient call me when she receives and we can setup appointment to place continuous glucose monitor and complete training.   Hypertension - New goal.: Blood pressure under good control. Blood pressure is at goal of <130/80 mmHg per 2017 AHA/ACC guidelines. Current medications: quinapril 10 mg by mouth once daily, verapamil SR 180 mg by mouth at bedtime, metoprolol tartrate 50 mg by mouth twice daily, and furosemide 20 mg once daily Intolerances: none Taking medications as directed: yes Side effects thought to be attributed to current medication regimen: maybe, will monitor closely for continued dizziness/fainting Reports fainting recently Current home blood pressure: not discussed today due to time limitations Recommend home blood pressure monitoring to discuss at next visit Continue current medications as above  Coronary artery disease/Hyperlipidemia - New goal.: Uncontrolled. LDL above goal of <70 due to very high risk given 10-year risk >20% per 2020 AACE/ACE guidelines. Triglycerides at goal of <150 per 2020 AACE/ACE guidelines. Current medications: pravastatin 80 mg by mouth once daily Patient is also taking a nitrate (isosorbide), beta blocker, and ACE inhibitor.  Intolerances: atorvastatin  (muscle aches) Taking medications as directed: yes Side effects thought to be attributed to current medication regimen: no Continue pravastatin 80 mg by mouth once daily Since LDL remains above goal, consider addition of ezetimibe 10  mg by mouth once daily. Will recommend to PCP.  Encourage dietary reduction of high fat containing foods such as butter, nuts, bacon, egg yolks, etc. Reviewed risks of hyperlipidemia, principles of treatment and consequences of untreated hyperlipidemia Discussed need for medication compliance Re-check lipid panel in 4-12 weeks  Patient Goals/Self-Care Activities Patient will:  Focus on medication adherence by keeping up with prescription refills and either using a pill box or reminders to take your medications at the prescribed times Check blood sugar twice a day at the following times: fasting (at least 8 hours since last food consumption), 1-2 hours after lunch, and whenever patient experiences symptoms of hypo/hyperglycemia, document, and provide at future appointments Check blood pressure at least once daily, document, and provide at future appointments Engage in dietary modifications by less frequent dining out, decreased fat intake, fewer sweetened foods & beverages, and better food choices  Follow Up Plan: Telephone follow up appointment with care management team member scheduled for: 03/03/21      Medication Assistance:  [place] obtained through [place] medication assistance program.  Enrollment ends 02/18/21  Patient's preferred pharmacy is:  [id number] [place] [place] [place]Phone: [phone]: [phone] [facility] [place] [address] [place]Phone: [phone]: [phone] Follow Up:  Patient agrees to Care Plan and  Follow-up.  Plan: Telephone follow up appointment with care management team member scheduled for:  03/03/21  CKennon Holter PharmD Clinical Pharmacist [facility] [phone]

## 2021-02-03 NOTE — Patient Instructions (Signed)
Jennifer Blackburn,  It was great to talk to you today!  Please call me with any questions or concerns.   Visit Information   PATIENT GOALS/PLAN OF CARE:  Care Plan : Medication Management  Updates made by Beryle Lathe, [place] since 02/03/2021 12:00 AM     Problem: T2DM, CAD/HLD, HTN   Priority: High  Onset Date: 02/03/2021     Long-Range Goal: Disease Progression Prevention   Start Date: 02/03/2021  Expected End Date: 05/04/2021  This Visit's Progress: On track  Priority: High  Note:   Current Barriers:  Unable to independently monitor therapeutic efficacy Unable to achieve control of diabetes and hyperlipidemia  Pharmacist Clinical Goal(s):  Patient will Achieve adherence to monitoring guidelines and medication adherence to achieve therapeutic efficacy Achieve control of diabetes and hyperlipidemia as evidenced by improved fasting blood sugar, improved A1c, and improved LDL through collaboration with PharmD and provider.   Interventions: 1:1 collaboration with Fayrene Helper, MD regarding development and update of comprehensive plan of care as evidenced by provider attestation and co-signature Inter-disciplinary care team collaboration (see longitudinal plan of care) Comprehensive medication review performed; medication list updated in electronic medical record  Type 2 Diabetes - New goal.: Followed with Dr. Cruzita Lederer (endocrinology) Uncontrolled; Most recent A1c above goal of <7% per ADA guidelines Current medications: metformin 1,000 mg by mouth twice daily with meals and insulin glargine (Toujeo) 30 units every morning and 40 units every evening and insulin aspart (Novolog) 15 units subcutaneously 5-15 minutes before each meal Patient is approved for patient assistance through [place] for [place]. Prescription for Ozempic 1 mg per week from endocrinology has been received and is in the process of being shipped. This medication will be delivered to provider  office hopefully within the next 2 weeks. However, there are currently shortages of several semaglutide products that may cause a delay.  Intolerances: pioglitazone (swelling) and glipizide (lack of efficacy) Taking medications as directed: yes Side effects thought to be attributed to current medication regimen: maybe, is having some GI upset/diarrhea potentially from metformin Denies hypoglycemic symptoms (sweaty and shaky). Denies hyperglycemic symptoms (excessive thirst, excessive hunger, and excessive urination. Hypoglycemia prevention: not indicated at this time Current meal patterns: breakfast: eggs and 1 piece of toast and sometimes some fruit (banana) or sometimes cereal ; lunch:  1 piece of toast with can of peas or carrot or other vegetable ; dinner: baked chicken or pork chops or fried fish with beans or vegetables with bread; snacks: crackers, vegetables, and fruit ; beverages: water, skim milk, and coffee with creamer (not sugar free); will sometimes use Stevia in her tea Current exercise: not active due to injury from recent falls On a statin: yes On aspirin 81 mg daily: no Last microalbumin: 13 (01/28/21); on an ACEi/ARB: yes Last eye exam: completed within last year Last foot exam: completed within last year Pneumonia vaccine: series complete Influenza vaccine: up to date Shingrix: overdue; series not started. Patient can receive at their pharmacy.  TDAP: overdue (last 2012). Patient can receive at their pharmacy. Current glucose readings: patient current checks blood glucose twice daily (fasting and after lunch/before dinner); fasting blood glucose:  119, 128, 130, 143 ; post prandial glucose:  198, 202, 210 Discussed diet and how to improved by eating less cereal, bread, fruit and milk Continue current medications as above per endocrinology Patient identified as a good candidate for continuous glucose monitor. Recommend Dexcom G6. Will send prescription to DME. Will need reader  device. Will  have patient call me when she receives and we can setup appointment to place continuous glucose monitor and complete training.   Hypertension - New goal.: Blood pressure under good control. Blood pressure is at goal of <130/80 mmHg per 2017 AHA/ACC guidelines. Current medications: quinapril 10 mg by mouth once daily, verapamil SR 180 mg by mouth at bedtime, metoprolol tartrate 50 mg by mouth twice daily, and furosemide 20 mg once daily Intolerances: none Taking medications as directed: yes Side effects thought to be attributed to current medication regimen: maybe, will monitor closely for continued dizziness/fainting Reports fainting recently Current home blood pressure: not discussed today due to time limitations Recommend home blood pressure monitoring to discuss at next visit Continue current medications as above  Coronary artery disease/Hyperlipidemia - New goal.: Uncontrolled. LDL above goal of <70 due to very high risk given 10-year risk >20% per 2020 AACE/ACE guidelines. Triglycerides at goal of <150 per 2020 AACE/ACE guidelines. Current medications: pravastatin 80 mg by mouth once daily Patient is also taking a nitrate (isosorbide), beta blocker, and ACE inhibitor.  Intolerances: atorvastatin (muscle aches) Taking medications as directed: yes Side effects thought to be attributed to current medication regimen: no Continue pravastatin 80 mg by mouth once daily Since LDL remains above goal, consider addition of ezetimibe 10 mg by mouth once daily. Will recommend to PCP.  Encourage dietary reduction of high fat containing foods such as butter, nuts, bacon, egg yolks, etc. Reviewed risks of hyperlipidemia, principles of treatment and consequences of untreated hyperlipidemia Discussed need for medication compliance Re-check lipid panel in 4-12 weeks  Patient Goals/Self-Care Activities Patient will:  Focus on medication adherence by keeping up with prescription refills and  either using a pill box or reminders to take your medications at the prescribed times Check blood sugar twice a day at the following times: fasting (at least 8 hours since last food consumption), 1-2 hours after lunch, and whenever patient experiences symptoms of hypo/hyperglycemia, document, and provide at future appointments Check blood pressure at least once daily, document, and provide at future appointments Engage in dietary modifications by less frequent dining out, decreased fat intake, fewer sweetened foods & beverages, and better food choices  Follow Up Plan: Telephone follow up appointment with care management team member scheduled for: 03/03/21      Consent to CCM Services: Ms. Tersigni was given information about Chronic Care Management services including:  CCM service includes personalized support from designated clinical staff supervised by her physician, including individualized plan of care and coordination with other care providers 24/7 contact phone numbers for assistance for urgent and routine care needs. Service will only be billed when office clinical staff spend 20 minutes or more in a month to coordinate care. Only one practitioner may furnish and bill the service in a calendar month. The patient may stop CCM services at any time (effective at the end of the month) by phone call to the office staff. The patient will be responsible for cost sharing (co-pay) of up to 20% of the service fee (after annual deductible is met).  Patient agreed to services and verbal consent obtained.   Patient verbalizes understanding of instructions provided today and agrees to view in [place].   Telephone follow up appointment with care management team member scheduled for:03/03/21  Kennon Holter, PharmD Clinical Pharmacist [facility] [phone]

## 2021-02-10 ENCOUNTER — Ambulatory Visit: Payer: Medicare HMO | Admitting: Orthopaedic Surgery

## 2021-02-10 ENCOUNTER — Other Ambulatory Visit: Payer: Self-pay

## 2021-02-10 DIAGNOSIS — M1712 Unilateral primary osteoarthritis, left knee: Secondary | ICD-10-CM | POA: Diagnosis not present

## 2021-02-10 DIAGNOSIS — M25562 Pain in left knee: Secondary | ICD-10-CM

## 2021-02-10 MED ORDER — LIDOCAINE HCL 1 % IJ SOLN
2.0000 mL | INTRAMUSCULAR | Status: AC | PRN
  Administered 2021-02-10: 2 mL

## 2021-02-10 MED ORDER — METHYLPREDNISOLONE ACETATE 40 MG/ML IJ SUSP
40.0000 mg | INTRAMUSCULAR | Status: AC | PRN
  Administered 2021-02-10: 40 mg via INTRA_ARTICULAR

## 2021-02-10 MED ORDER — BUPIVACAINE HCL 0.5 % IJ SOLN
2.0000 mL | INTRAMUSCULAR | Status: AC | PRN
  Administered 2021-02-10: 2 mL via INTRA_ARTICULAR

## 2021-02-10 NOTE — Progress Notes (Signed)
Office Visit Note   Patient: Jennifer Blackburn           Date of Birth: January 25, 1946           MRN: [id number] Visit Date: 02/10/2021              Requested by: Fayrene Helper, MD [address] [place],  [place] [address] PCP: Fayrene Helper, MD   Assessment & Plan: Visit Diagnoses:  1. Primary osteoarthritis of left knee     Plan: Impression is left knee OA aggravation.  Based on options she elected to undergo cortisone injection today.  She will continue to treat her symptoms and take it easy from activity standpoint.  We will see her back as needed.  Follow-Up Instructions: No follow-ups on file.   Orders:  No orders of the defined types were placed in this encounter.  No orders of the defined types were placed in this encounter.     Procedures: Large Joint Inj: L knee on 02/10/2021 3:08 PM Details: 22 G needle Medications: 2 mL bupivacaine 0.5 %; 2 mL lidocaine 1 %; 40 mg methylPREDNISolone acetate 40 MG/ML Outcome: tolerated well, no immediate complications Patient was prepped and draped in the usual sterile fashion.      Clinical Data: No additional findings.   Subjective: Chief Complaint  Patient presents with   Left Knee - Pain    HPI  Jennifer Blackburn comes in today for recent worsening of left knee pain after she fell about 4 to 6 weeks ago.  She has pain all over her knee.  She has aching throbbing pain as well.  Denies any mechanical symptoms.  She is still been able to go to church and goes to the grocery store.  She has been using Biofreeze and Tylenol extra strength.  Review of Systems   Objective: Vital Signs: LMP 11/11/2016   Physical Exam  Ortho Exam  Left knee shows no joint effusion.  Mild patellofemoral crepitus with range of motion.  Collaterals and cruciates are stable.  No joint line tenderness.  Specialty Comments:  No specialty comments available.  Imaging: No results found.   PMFS History: Patient Active  Problem List   Diagnosis Date Noted   Left knee pain 01/25/2021   Breast mass, right 12/25/2020   Conjunctivitis 09/23/2020   Bloating 06/26/2020   Nausea without vomiting 06/26/2020   Constipation 06/06/2020   Belching 06/06/2020   History of gastric ulcer 05/07/2019   Muscle pain 02/07/2019   Fatigue 02/07/2019   Dysphagia 11/11/2018   History of colonic polyps 11/11/2018   Obesity (BMI 30.0-34.9) 11/05/2018   Cervical spondylosis with radiculopathy 06/20/2017   Hypertension    Hyperlipidemia    Depression    CAD (coronary artery disease)    Anxiety    Osteoarthritis 02/17/2017   Family history of coronary artery disease in father 02/17/2017   Lumbar spondylosis with myelopathy 07/18/2016   Laryngopharyngeal reflux (LPR) 06/24/2016   Type 2 diabetes mellitus with vascular disease ([place]) 01/13/2015   Insomnia due to stress 08/20/2014   GERD (gastroesophageal reflux disease) 04/11/2014   At high risk for falls 12/31/2013   Postsurgical hypothyroidism 01/05/2013   Snoring 01/05/2013   Back pain with radiculopathy 11/28/2012   Anxiety and depression 10/16/2011   Anemia 03/30/2011   Posterior right knee pain 01/24/2011   Vitamin D deficiency 10/14/2010   Bilateral carotid bruits 10/13/2010   Allergic rhinitis 11/23/2007   CARPAL TUNNEL SYNDROME, BILATERAL 04/14/2007  Coronary atherosclerosis 04/14/2007   Headache 04/14/2007   Thyroid cancer ([place]) 03/23/1999   Past Medical History:  Diagnosis Date   Anxiety    Arthritis    CAD (coronary artery disease)    Depression    Diabetes mellitus type II    without complication   DJD (degenerative joint disease) of lumbar spine    Hypercholesteremia    Hyperlipidemia    Hypertension    benign    Hypothyroidism    Thyroid cancer ([place]) 2001    Family History  Problem Relation Age of Onset   Heart attack Father    Heart failure Mother    Asthma Daughter    Sleep apnea Son        CPAP   Colon cancer Neg Hx     Past  Surgical History:  Procedure Laterality Date   ABDOMINAL HYSTERECTOMY     CARDIAC CATHETERIZATION     CATARACT EXTRACTION W/PHACO Right 07/17/2012   Procedure: CATARACT EXTRACTION PHACO AND INTRAOCULAR LENS PLACEMENT ([place]);  Surgeon: Tonny Branch, MD;  Location: [facility];  Service: Ophthalmology;  Laterality: Right;  CDE:25.51   CATARACT EXTRACTION W/PHACO Left 09/17/2016   Procedure: CATARACT EXTRACTION PHACO AND INTRAOCULAR LENS PLACEMENT LEFT EYE;  Surgeon: Tonny Branch, MD;  Location: [facility] ORS;  Service: Ophthalmology;  Laterality: Left;  CDE: 19.23   COLONOSCOPY     COLONOSCOPY N/A 01/15/2014   Procedure: COLONOSCOPY;  Surgeon: Daneil Dolin, MD;  Location: [facility] ENDO SUITE;  Service: Endoscopy;  Laterality: N/A;  9:00 AM   COLONOSCOPY WITH PROPOFOL N/A 01/25/2019   Procedure: COLONOSCOPY WITH PROPOFOL;  Surgeon: Daneil Dolin, MD; diverticulosis in the entire colon, otherwise normal exam.  No repeat colonoscopy due to age.   DOPPLER ECHOCARDIOGRAPHY     ESOPHAGOGASTRODUODENOSCOPY (EGD) WITH PROPOFOL N/A 01/25/2019   Procedure: ESOPHAGOGASTRODUODENOSCOPY (EGD) WITH PROPOFOL;  Surgeon: Daneil Dolin, MD; normal esophagus without dilation due to inability to pass dilator beyond the hypopharynx, 1 small nonbleeding gastric ulcer s/p biopsied, otherwise normal exam.  Pathology with ulcer with reactive changes, no H. pylori, metaplasia, dysplasia, or malignancy.   ESOPHAGOGASTRODUODENOSCOPY (EGD) WITH PROPOFOL N/A 08/21/2019   Procedure: ESOPHAGOGASTRODUODENOSCOPY (EGD) WITH PROPOFOL;  Surgeon: Daneil Dolin, MD; Normal esophagus. Previously noted gastric ulcer completely healed. Normal examined duodenum.    JOINT REPLACEMENT  07/01/2010   left hip   KNEE SURGERY Right    arthroscopy   left hip replaced  07/01/2010   SPINE SURGERY  2006   cervical   stress dipyridamole myocardial perfusion     THYROIDECTOMY     TONSILLECTOMY     VESICOVAGINAL FISTULA CLOSURE W/ TAH     Social History    Occupational History   Occupation: retired     Comment: bank  Tobacco Use   Smoking status: Never    Passive exposure: Yes   Smokeless tobacco: Never   Tobacco comments:    Husband smokes in the home  Vaping Use   Vaping Use: Never used  Substance and Sexual Activity   Alcohol use: No   Drug use: No   Sexual activity: Never

## 2021-02-16 ENCOUNTER — Telehealth: Payer: Self-pay

## 2021-02-16 NOTE — Telephone Encounter (Signed)
Patient called asked if Jennifer Blackburn give her a call back. # [phone] regarding diabetic issues.

## 2021-02-18 DIAGNOSIS — Z7984 Long term (current) use of oral hypoglycemic drugs: Secondary | ICD-10-CM | POA: Diagnosis not present

## 2021-02-18 DIAGNOSIS — I2510 Atherosclerotic heart disease of native coronary artery without angina pectoris: Secondary | ICD-10-CM | POA: Diagnosis not present

## 2021-02-18 DIAGNOSIS — I10 Essential (primary) hypertension: Secondary | ICD-10-CM | POA: Diagnosis not present

## 2021-02-18 DIAGNOSIS — E1159 Type 2 diabetes mellitus with other circulatory complications: Secondary | ICD-10-CM | POA: Diagnosis not present

## 2021-02-18 DIAGNOSIS — I2583 Coronary atherosclerosis due to lipid rich plaque: Secondary | ICD-10-CM | POA: Diagnosis not present

## 2021-02-18 DIAGNOSIS — E782 Mixed hyperlipidemia: Secondary | ICD-10-CM

## 2021-02-18 DIAGNOSIS — Z794 Long term (current) use of insulin: Secondary | ICD-10-CM

## 2021-02-19 ENCOUNTER — Telehealth: Payer: Self-pay

## 2021-02-19 NOTE — Telephone Encounter (Signed)
Called and left a detailed message advising pt assistance Ozempic (4 boxes) have been delivered and ready for pick up.

## 2021-02-20 ENCOUNTER — Telehealth: Payer: Self-pay

## 2021-02-20 NOTE — Telephone Encounter (Signed)
Dr. Cruzita Lederer pt:   Is there a Generic for Ozempic? How long does the Patient Assisance last for Ozempic?  Call back phone [phone].   Patient Pick up 4 boxes  of Ozempic today in the office.

## 2021-02-20 NOTE — Telephone Encounter (Signed)
Called and advised pt there is no generic for Ozempic. Each box should last a month so she would have a 4 month supply with the medication she picked up today.

## 2021-02-25 NOTE — Progress Notes (Signed)
Mammogram was negative.

## 2021-03-02 ENCOUNTER — Telehealth: Payer: Self-pay

## 2021-03-02 NOTE — Telephone Encounter (Signed)
Pt currently scheduled for OV 04/2021, please add to cancellation list.

## 2021-03-03 ENCOUNTER — Telehealth: Payer: Medicare HMO

## 2021-03-03 ENCOUNTER — Telehealth: Payer: Self-pay | Admitting: Pharmacist

## 2021-03-03 NOTE — Telephone Encounter (Signed)
°  Care Management   Follow Up Note  03/03/2021 Name: Jennifer Blackburn MRN: [id number] DOB: 1945-10-05  Referred by: Fayrene Helper, MD Reason for referral : Chronic Care Management (Unsuccessful telephone outreach)  An unsuccessful telephone outreach was attempted today. The patient was referred to the case management team for assistance with care management and care coordination.   Follow Up Plan: The care management team will reach out to the patient again over the next 7 days.   Kennon Holter, PharmD, [place], CPP Clinical Pharmacist Practitioner [facility] [phone]

## 2021-03-04 ENCOUNTER — Other Ambulatory Visit: Payer: Self-pay

## 2021-03-04 ENCOUNTER — Encounter: Payer: Self-pay | Admitting: Family Medicine

## 2021-03-04 ENCOUNTER — Ambulatory Visit (INDEPENDENT_AMBULATORY_CARE_PROVIDER_SITE_OTHER): Payer: Medicare HMO | Admitting: Family Medicine

## 2021-03-04 VITALS — BP 127/68

## 2021-03-04 DIAGNOSIS — E1159 Type 2 diabetes mellitus with other circulatory complications: Secondary | ICD-10-CM | POA: Diagnosis not present

## 2021-03-04 DIAGNOSIS — I10 Essential (primary) hypertension: Secondary | ICD-10-CM | POA: Diagnosis not present

## 2021-03-04 DIAGNOSIS — R140 Abdominal distension (gaseous): Secondary | ICD-10-CM | POA: Diagnosis not present

## 2021-03-04 DIAGNOSIS — K5903 Drug induced constipation: Secondary | ICD-10-CM | POA: Diagnosis not present

## 2021-03-04 NOTE — Patient Instructions (Addendum)
F/U as before, call if you need me before.  Stool softener, colace , which is OTC,1 to 2 every day , water 64 ounces daily, use laxative every 3 days if no BM  Please do not give up on ozempic, work with it!  Thanks for choosing [facility], we consider it a privelige to serve you.

## 2021-03-05 ENCOUNTER — Other Ambulatory Visit: Payer: Self-pay | Admitting: Family Medicine

## 2021-03-05 NOTE — Telephone Encounter (Signed)
Rescheduled 03/19/21

## 2021-03-11 ENCOUNTER — Telehealth: Payer: Self-pay | Admitting: Internal Medicine

## 2021-03-11 NOTE — Telephone Encounter (Signed)
Mychart message sent to pt to get clarification on concerns.

## 2021-03-11 NOTE — Telephone Encounter (Signed)
Pt called concerning Ozempic and issues with constipation. PT verification on refill of Ozempic 2023. Please advise via MyChart

## 2021-03-13 ENCOUNTER — Telehealth: Payer: Self-pay | Admitting: Internal Medicine

## 2021-03-13 NOTE — Telephone Encounter (Signed)
PT called again needing clarification on Ozempic and taking stool softener together. Please view My chart notes for additional information. [phone]

## 2021-03-16 ENCOUNTER — Encounter: Payer: Self-pay | Admitting: Family Medicine

## 2021-03-16 NOTE — Assessment & Plan Note (Signed)
DASH diet and commitment to daily physical activity for a minimum of 30 minutes discussed and encouraged, as a part of hypertension management. The importance of attaining a healthy weight is also discussed.  BP/Weight 03/04/2021 01/21/2021 01/12/2021 12/25/2020 12/23/2020 0/12/710 03/30/7586  Systolic BP 325 498 264 158 309 407 680  Diastolic BP 68 68 52 72 68 66 70  Wt. (Lbs) - 188.04 187 191.12 195 187.2 185  BMI - 31.29 31.12 31.8 32.45 31.15 30.79     Controlled, no change in medication

## 2021-03-16 NOTE — Progress Notes (Signed)
Virtual Visit via Telephone Note  I connected with Jennifer Blackburn on 03/16/21 at  4:20 PM EST by telephone and verified that I am speaking with the correct person using two identifiers.  Location: Patient: home Provider: office   I discussed the limitations, risks, security and privacy concerns of performing an evaluation and management service by telephone and the availability of in person appointments. I also discussed with the patient that there may be a patient responsible charge related to this service. The patient expressed understanding and agreed to proceed.   History of Present Illness: C/ constipation and and bloating with nausea on new higher dose of ozempic , afraid o take additional dose of ozempic unsure if related to current symptoms,no BM in 5 days has to strain and feels faint and light headed with BM   Observations/Objective: BP 127/68    LMP 11/11/2016  Good communication with no confusion and intact memory. Alert and oriented x 3 No signs of respiratory distress during speech   Assessment and Plan:  Bloating Increased with higher dose of ozempic and nausea, encouraged her to continue to work swish the medication as she notes improvement in blood sugar  Constipation Commit to daily stool softener, 64 oz water, daily exercise and laxative every 3 days if needed  Hypertension DASH diet and commitment to daily physical activity for a minimum of 30 minutes discussed and encouraged, as a part of hypertension management. The importance of attaining a healthy weight is also discussed.  BP/Weight 03/04/2021 01/21/2021 01/12/2021 12/25/2020 12/23/2020 07/08/3788 04/26/971  Systolic BP 532 992 426 834 196 222 979  Diastolic BP 68 68 52 72 68 66 70  Wt. (Lbs) - 188.04 187 191.12 195 187.2 185  BMI - 31.29 31.12 31.8 32.45 31.15 30.79     Controlled, no change in medication   Type 2 diabetes mellitus with vascular disease ([name]) [name] is reminded of the importance  of commitment to daily physical activity for 30 minutes or more, as able and the need to limit carbohydrate intake to 30 to 60 grams per meal to help with blood sugar control.   The need to take medication as prescribed, test blood sugar as directed, and to call between visits if there is a concern that blood sugar is uncontrolled is also discussed.   [name] is reminded of the importance of daily foot exam, annual eye examination, and good blood sugar, blood pressure and cholesterol control. uncontrolled and deteriorated, managed by Endo, pt encouraged to work with medication and healthy  lifestyle esp diet  Diabetic Labs Latest Ref Rng & Units 01/28/2021 01/12/2021 12/23/2020 08/07/2020 07/18/2020  HbA1c 4.0 - 5.6 % - - 10.1(A) 9.0(A) -  Microalbumin mg/dL - - - - -  Micro/Creat Ratio 0 - 29 mg/g creat 13 - - - -  Chol 100 - 199 mg/dL 171 - - - 179  HDL >39 mg/dL 43 - - - 40  Calc LDL 0 - 99 mg/dL 105(H) - - - 110(H)  Triglycerides 0 - 149 mg/dL 126 - - - 164(H)  Creatinine 0.57 - 1.00 mg/dL 0.92 0.98 - - 0.83   BP/Weight 03/04/2021 01/21/2021 01/12/2021 12/25/2020 12/23/2020 8/92/1194 03/29/4079  Systolic BP 448 185 631 497 026 378 588  Diastolic BP 68 68 52 72 68 66 70  Wt. (Lbs) - 188.04 187 191.12 195 187.2 185  BMI - 31.29 31.12 31.8 32.45 31.15 30.79   Foot/eye exam completion dates Latest Ref Rng & Units 01/21/2021  10/28/2020  Eye Exam No Retinopathy - No Retinopathy  Foot exam Order - - -  Foot Form Completion - Done -        Follow Up Instructions:    I discussed the assessment and treatment plan with the patient. The patient was provided an opportunity to ask questions and all were answered. The patient agreed with the plan and demonstrated an understanding of the instructions.   The patient was advised to call back or seek an in-person evaluation if the symptoms worsen or if the condition fails to improve as anticipated.  I provided 15 minutes of non-face-to-face time  during this encounter.   Tula Nakayama, MD

## 2021-03-16 NOTE — Assessment & Plan Note (Signed)
Increased with higher dose of ozempic and nausea, encouraged her to continue to work swish the medication as she notes improvement in blood sugar

## 2021-03-16 NOTE — Assessment & Plan Note (Signed)
Jennifer Blackburn is reminded of the importance of commitment to daily physical activity for 30 minutes or more, as able and the need to limit carbohydrate intake to 30 to 60 grams per meal to help with blood sugar control.   The need to take medication as prescribed, test blood sugar as directed, and to call between visits if there is a concern that blood sugar is uncontrolled is also discussed.   Jennifer Blackburn is reminded of the importance of daily foot exam, annual eye examination, and good blood sugar, blood pressure and cholesterol control. uncontrolled and deteriorated, managed by Endo, pt encouraged to work with medication and healthy  lifestyle esp diet  Diabetic Labs Latest Ref Rng & Units 01/28/2021 01/12/2021 12/23/2020 08/07/2020 07/18/2020  HbA1c 4.0 - 5.6 % - - 10.1(A) 9.0(A) -  Microalbumin mg/dL - - - - -  Micro/Creat Ratio 0 - 29 mg/g creat 13 - - - -  Chol 100 - 199 mg/dL 171 - - - 179  HDL >39 mg/dL 43 - - - 40  Calc LDL 0 - 99 mg/dL 105(H) - - - 110(H)  Triglycerides 0 - 149 mg/dL 126 - - - 164(H)  Creatinine 0.57 - 1.00 mg/dL 0.92 0.98 - - 0.83   BP/Weight 03/04/2021 01/21/2021 01/12/2021 12/25/2020 12/23/2020 3/73/6681 07/28/4705  Systolic BP 615 183 437 357 897 847 841  Diastolic BP 68 68 52 72 68 66 70  Wt. (Lbs) - 188.04 187 191.12 195 187.2 185  BMI - 31.29 31.12 31.8 32.45 31.15 30.79   Foot/eye exam completion dates Latest Ref Rng & Units 01/21/2021 10/28/2020  Eye Exam No Retinopathy - No Retinopathy  Foot exam Order - - -  Foot Form Completion - Done -

## 2021-03-16 NOTE — Assessment & Plan Note (Signed)
Commit to daily stool softener, 64 oz water, daily exercise and laxative every 3 days if needed

## 2021-03-17 ENCOUNTER — Other Ambulatory Visit: Payer: Self-pay | Admitting: Family Medicine

## 2021-03-17 ENCOUNTER — Telehealth: Payer: Self-pay | Admitting: Internal Medicine

## 2021-03-17 NOTE — Telephone Encounter (Signed)
Follow up in previous encounter.

## 2021-03-17 NOTE — Telephone Encounter (Signed)
PT request a call back in regards to message dated 03/13/21. PT can be reached at [phone].

## 2021-03-17 NOTE — Telephone Encounter (Signed)
Multiple encounters. Addressed in previous encounter.

## 2021-03-18 ENCOUNTER — Other Ambulatory Visit: Payer: Self-pay

## 2021-03-18 DIAGNOSIS — M549 Dorsalgia, unspecified: Secondary | ICD-10-CM

## 2021-03-18 DIAGNOSIS — I10 Essential (primary) hypertension: Secondary | ICD-10-CM

## 2021-03-18 MED ORDER — FUROSEMIDE 20 MG PO TABS: 20.0000 mg | ORAL_TABLET | Freq: Every day | ORAL | 1 refills | Status: DC

## 2021-03-18 MED ORDER — QUINAPRIL HCL 10 MG PO TABS
10.0000 mg | ORAL_TABLET | Freq: Every day | ORAL | 1 refills | Status: DC
Start: 2021-03-18 — End: 2021-03-24

## 2021-03-18 MED ORDER — HYDROXYZINE HCL 10 MG PO TABS: ORAL_TABLET | ORAL | 1 refills | Status: DC

## 2021-03-18 MED ORDER — IMIPRAMINE HCL 50 MG PO TABS: 50.0000 mg | ORAL_TABLET | Freq: Every day | ORAL | 2 refills | Status: DC

## 2021-03-18 MED ORDER — PRAVASTATIN SODIUM 80 MG PO TABS: 80.0000 mg | ORAL_TABLET | Freq: Every day | ORAL | 1 refills | Status: DC

## 2021-03-18 MED ORDER — GABAPENTIN 100 MG PO CAPS: ORAL_CAPSULE | ORAL | 1 refills | Status: DC

## 2021-03-18 MED ORDER — METFORMIN HCL 1000 MG PO TABS: 1000.0000 mg | ORAL_TABLET | Freq: Two times a day (BID) | ORAL | 1 refills | Status: DC

## 2021-03-19 ENCOUNTER — Other Ambulatory Visit: Payer: Self-pay

## 2021-03-19 ENCOUNTER — Encounter: Payer: Self-pay | Admitting: Internal Medicine

## 2021-03-19 ENCOUNTER — Ambulatory Visit: Payer: Medicare HMO | Admitting: Internal Medicine

## 2021-03-19 ENCOUNTER — Ambulatory Visit (INDEPENDENT_AMBULATORY_CARE_PROVIDER_SITE_OTHER): Payer: Medicare HMO | Admitting: Pharmacist

## 2021-03-19 VITALS — BP 120/69 | HR 75 | Temp 97.1°F | Ht 65.0 in | Wt 188.0 lb

## 2021-03-19 DIAGNOSIS — K219 Gastro-esophageal reflux disease without esophagitis: Secondary | ICD-10-CM | POA: Diagnosis not present

## 2021-03-19 DIAGNOSIS — I2583 Coronary atherosclerosis due to lipid rich plaque: Secondary | ICD-10-CM

## 2021-03-19 DIAGNOSIS — K5900 Constipation, unspecified: Secondary | ICD-10-CM

## 2021-03-19 DIAGNOSIS — E1159 Type 2 diabetes mellitus with other circulatory complications: Secondary | ICD-10-CM

## 2021-03-19 DIAGNOSIS — E782 Mixed hyperlipidemia: Secondary | ICD-10-CM

## 2021-03-19 DIAGNOSIS — I10 Essential (primary) hypertension: Secondary | ICD-10-CM

## 2021-03-19 MED ORDER — PANTOPRAZOLE SODIUM 40 MG PO TBEC: 40.0000 mg | DELAYED_RELEASE_TABLET | Freq: Every day | ORAL | 11 refills | Status: DC

## 2021-03-19 NOTE — Progress Notes (Signed)
Primary Care Physician:  Fayrene Helper, MD Primary Gastroenterologist:  Dr. Gala Romney  Pre-Procedure History & Physical: HPI:  Jennifer Blackburn is a 75 y.o. female here for further evaluation of constipation and recurrent GERD symptoms.  She reports stopping Protonix with quiescent symptoms.  No dysphagia.  Over the holidays, they came back with a vengeance.  Not having any dysphagia.  Worsening constipation perceived ith increasing doses of Ozempic.  Has not had any melena or rectal bleeding.  Distant history colonic adenomas; Future colonoscopy not recommended due to age.  Weight has been stable.  Past Medical History:  Diagnosis Date   Anxiety    Arthritis    CAD (coronary artery disease)    Depression    Diabetes mellitus type II    without complication   DJD (degenerative joint disease) of lumbar spine    Hypercholesteremia    Hyperlipidemia    Hypertension    benign    Hypothyroidism    Thyroid cancer ([place]) 2001    Past Surgical History:  Procedure Laterality Date   ABDOMINAL HYSTERECTOMY     CARDIAC CATHETERIZATION     CATARACT EXTRACTION W/PHACO Right 07/17/2012   Procedure: CATARACT EXTRACTION PHACO AND INTRAOCULAR LENS PLACEMENT ([place]);  Surgeon: Tonny Branch, MD;  Location: [facility];  Service: Ophthalmology;  Laterality: Right;  CDE:25.51   CATARACT EXTRACTION W/PHACO Left 09/17/2016   Procedure: CATARACT EXTRACTION PHACO AND INTRAOCULAR LENS PLACEMENT LEFT EYE;  Surgeon: Tonny Branch, MD;  Location: [facility];  Service: Ophthalmology;  Laterality: Left;  CDE: 19.23   COLONOSCOPY     COLONOSCOPY N/A 01/15/2014   Procedure: COLONOSCOPY;  Surgeon: Daneil Dolin, MD;  Location: [facility];  Service: Endoscopy;  Laterality: N/A;  9:00 AM   COLONOSCOPY WITH PROPOFOL N/A 01/25/2019   Procedure: COLONOSCOPY WITH PROPOFOL;  Surgeon: Daneil Dolin, MD; diverticulosis in the entire colon, otherwise normal exam.  No repeat colonoscopy due to age.   DOPPLER ECHOCARDIOGRAPHY      ESOPHAGOGASTRODUODENOSCOPY (EGD) WITH PROPOFOL N/A 01/25/2019   Procedure: ESOPHAGOGASTRODUODENOSCOPY (EGD) WITH PROPOFOL;  Surgeon: Daneil Dolin, MD; normal esophagus without dilation due to inability to pass dilator beyond the hypopharynx, 1 small nonbleeding gastric ulcer s/p biopsied, otherwise normal exam.  Pathology with ulcer with reactive changes, no H. pylori, metaplasia, dysplasia, or malignancy.   ESOPHAGOGASTRODUODENOSCOPY (EGD) WITH PROPOFOL N/A 08/21/2019   Procedure: ESOPHAGOGASTRODUODENOSCOPY (EGD) WITH PROPOFOL;  Surgeon: Daneil Dolin, MD; Normal esophagus. Previously noted gastric ulcer completely healed. Normal examined duodenum.    JOINT REPLACEMENT  07/01/2010   left hip   KNEE SURGERY Right    arthroscopy   left hip replaced  07/01/2010   SPINE SURGERY  2006   cervical   stress dipyridamole myocardial perfusion     THYROIDECTOMY     TONSILLECTOMY     VESICOVAGINAL FISTULA CLOSURE W/ TAH      Prior to Admission medications   Medication Sig Start Date End Date Taking? Authorizing Provider  acetaminophen (TYLENOL) 500 MG tablet Take 1,000 mg by mouth as needed (for pain.).   Yes [provider]  Cetirizine HCl 10 MG TBDP Take one tablet by mouth two times daily for allergy Patient taking differently: Take 1 tablet by mouth daily. 11/06/19  Yes Fayrene Helper, MD  Cholecalciferol (VITAMIN D3) 50 MCG (2000 UT) TABS Take 2,000 Units by mouth daily.   Yes [provider]  furosemide (LASIX) 20 MG tablet Take 1 tablet (20 mg total)  by mouth daily. 03/18/21  Yes Fayrene Helper, MD  gabapentin (NEURONTIN) 100 MG capsule TAKE 1 CAPSULE BY MOUTH 3 TIMES A DAY 03/18/21  Yes Fayrene Helper, MD  glucose blood (ACCU-CHEK GUIDE) test strip 1 each by Other route as needed for other. Use as instructed to check blood sugar 2 times per day dx code E11.65 03/27/20  Yes Philemon Kingdom, MD  hydrOXYzine (ATARAX) 10 MG tablet Take one tablet twice daily and two  tablets at bedtime as needed for generalized itching. 03/18/21  Yes Fayrene Helper, MD  imipramine (TOFRANIL) 50 MG tablet Take 1 tablet (50 mg total) by mouth at bedtime. 03/18/21  Yes Fayrene Helper, MD  insulin aspart (NOVOLOG FLEXPEN) 100 UNIT/ML FlexPen Inject 10-15 Units into the skin 3 (three) times daily with meals. Patient taking differently: Inject 15 Units into the skin 3 (three) times daily with meals. 12/03/20 12/03/21 Yes Philemon Kingdom, MD  insulin glargine, 1 Unit Dial, (TOUJEO SOLOSTAR) 300 UNIT/ML Solostar Pen Inject 30-50 units two times daily Patient taking differently: Inject 30 units every morning and 40 units every evening 05/14/20  Yes Philemon Kingdom, MD  Insulin Pen Needle 32G X 4 MM MISC Use 4x a day 12/28/18  Yes Philemon Kingdom, MD  isosorbide mononitrate (IMDUR) 30 MG 24 hr tablet TAKE HALF A TABLET BY MOUTH EVERY DAY 04/09/20  Yes Lorretta Harp, MD  Lancets (ACCU-CHEK MULTICLIX) lancets Use as instructed three times daily dx 250.01 05/07/13  Yes Fayrene Helper, MD  levothyroxine (SYNTHROID) 137 MCG tablet TAKE 1 TABLET BY MOUTH DAILY BEFORE BREAKFAST. 11/04/20  Yes Philemon Kingdom, MD  metFORMIN (GLUCOPHAGE) 1000 MG tablet Take 1 tablet (1,000 mg total) by mouth 2 (two) times daily. 03/18/21  Yes Fayrene Helper, MD  metoprolol tartrate (LOPRESSOR) 50 MG tablet Take 1 tablet (50 mg total) by mouth 2 (two) times daily. 04/09/20  Yes Lorretta Harp, MD  PARoxetine (PAXIL) 20 MG tablet TAKE 1 TABLET BY MOUTH EVERY DAY 03/17/21  Yes Fayrene Helper, MD  potassium chloride SA (KLOR-CON) 20 MEQ tablet Take 1 tablet (20 mEq total) by mouth daily. 11/06/19  Yes Fayrene Helper, MD  pravastatin (PRAVACHOL) 80 MG tablet Take 1 tablet (80 mg total) by mouth daily. 03/18/21  Yes Fayrene Helper, MD  quinapril (ACCUPRIL) 10 MG tablet Take 1 tablet (10 mg total) by mouth daily. 03/18/21  Yes Fayrene Helper, MD  Semaglutide, 1 MG/DOSE,  (OZEMPIC, 1 MG/DOSE,) 4 MG/3ML SOPN Inject 1 mg into the skin once a week. 08/07/20  Yes Philemon Kingdom, MD  verapamil (CALAN-SR) 180 MG CR tablet TAKE 1 TABLET BY MOUTH EVERYDAY AT BEDTIME 04/09/20  Yes Lorretta Harp, MD  vitamin B-12 (CYANOCOBALAMIN) 1000 MCG tablet Take 1 tablet (1,000 mcg total) by mouth daily. 08/20/14  Yes Fayrene Helper, MD  pantoprazole (PROTONIX) 40 MG tablet Take 1 tablet (40 mg total) by mouth daily. 03/19/21   Daneil Dolin, MD    Allergies as of 03/19/2021 - Review Complete 03/19/2021  Allergen Reaction Noted   Lipitor [atorvastatin calcium] Other (See Comments) 02/07/2019   Actos [pioglitazone] Other (See Comments) 02/03/2021   Crestor [rosuvastatin] Other (See Comments) 02/03/2021   Daypro [oxaprozin] Hives 08/23/2016   Nsaids Hives 04/14/2007   Sulfonamide derivatives Hives 04/14/2007    Family History  Problem Relation Age of Onset   Heart attack Father    Heart failure Mother    Asthma Daughter  Sleep apnea Son        CPAP   Colon cancer Neg Hx     Social History   Socioeconomic History   Marital status: Married    Spouse name: Not on file   Number of children: 2   Years of education: 14   Highest education level: Not on file  Occupational History   Occupation: retired     Comment: bank  Tobacco Use   Smoking status: Never    Passive exposure: Yes   Smokeless tobacco: Never   Tobacco comments:    Husband smokes in the home  Vaping Use   Vaping Use: Never used  Substance and Sexual Activity   Alcohol use: No   Drug use: No   Sexual activity: Never  Other Topics Concern   Not on file  Social History Narrative   Lives with husband, at home   No caffeine   Social Determinants of Health   Financial Resource Strain: Low Risk    Difficulty of Paying Living Expenses: Not hard at all  Food Insecurity: No Food Insecurity   Worried About Charity fundraiser in the Last Year: Never true   [place] in the Last  Year: Never true  Transportation Needs: No Transportation Needs   Lack of Transportation (Medical): No   Lack of Transportation (Non-Medical): No  Physical Activity: Insufficiently Active   Days of Exercise per Week: 3 days   Minutes of Exercise per Session: 20 min  Stress: No Stress Concern Present   Feeling of Stress : Not at all  Social Connections: Socially Integrated   Frequency of Communication with Friends and Family: More than three times a week   Frequency of Social Gatherings with Friends and Family: More than three times a week   Attends Religious Services: More than 4 times per year   Active Member of [employer] or Organizations: Yes   Attends Music therapist: More than 4 times per year   Marital Status: Married  Human resources officer Violence: Not At Risk   Fear of Current or Ex-Partner: No   Emotionally Abused: No   Physically Abused: No   Sexually Abused: No    Review of Systems: See HPI, otherwise negative ROS  Physical Exam: BP 120/69    Pulse 75    Temp (!) 97.1 F (36.2 C)    Ht 5\' 5"  (1.651 m)    Wt 188 lb (85.3 kg)    LMP 11/11/2016    BMI 31.28 kg/m  General:   Alert,  well-nourished, pleasant and cooperative in NAD Neck:  Supple; no masses or thyromegaly. No significant cervical adenopathy. Lungs:  Clear throughout to auscultation.   No wheezes, crackles, or rhonchi. No acute distress. Heart:  Regular rate and rhythm; no murmurs, clicks, rubs,  or gallops. Abdomen: Non-distended, normal bowel sounds.  Soft and nontender without appreciable mass or hepatosplenomegaly.  Pulses:  Normal pulses noted. Extremities:  Without clubbing or edema.  Impression/Plan:  76 year old lady with a history of GERD now with recurrent symptoms off PPI therapy.  No alarm features.  Also, work worsening of chronic constipation possibly related to medication.  Distant history of colonic adenoma with no future colonoscopy recommended   Recommendations:  Constipation and  GERD information provided  Plan to take one half capful of MiraLAX every night to achieve a bowel movement daily to every other day.  May increase to 1 capful as needed.  Take MiraLAX every night.  Resume pantoprazole or Protonix 40 mg daily each morning (dispense 30 with 11 refills)  Office visit in 3 months  Please call in the interim if symptoms aren't improving.   Notice: This dictation was prepared with Dragon dictation along with smaller phrase technology. Any transcriptional errors that result from this process are unintentional and may not be corrected upon review.

## 2021-03-19 NOTE — Patient Instructions (Signed)
Jennifer Blackburn,  It was great to talk to you today!  Please call me with any questions or concerns.   Visit Information  Following are the goals we discussed today:  Patient Goals/Self-Care Activities Patient will:  Focus on medication adherence by keeping up with prescription refills and either using a pill box or reminders to take your medications at the prescribed times Check blood sugar twice a day at the following times: fasting (at least 8 hours since last food consumption), 1-2 hours after lunch, and whenever patient experiences symptoms of hypo/hyperglycemia, document, and provide at future appointments Check blood pressure at least once daily, document, and provide at future appointments Engage in dietary modifications by less frequent dining out, decreased fat intake, fewer sweetened foods & beverages, and better food choices  Plan: Face to Face appointment with care management team member scheduled for: 04/23/21  Kennon Holter, PharmD, BCACP, CPP Clinical Pharmacist Practitioner [facility] [phone]  Please call the care guide team at [phone] if you need to cancel or reschedule your appointment.   Patient verbalizes understanding of instructions provided today and agrees to view in [place].

## 2021-03-19 NOTE — Chronic Care Management (AMB) (Signed)
Chronic Care Management Pharmacy Note  03/19/2021 Name:  Jennifer Blackburn MRN:  [id number] DOB:  [birth year]  Summary: Type 2 Diabetes Patient is approved for patient assistance through [employer] for Ozempic (PAP completed by endocrinology).  Patient reports taking 2 weeks off Ozempic due to constipation issues after increasing from 0.5 mg to 1 mg subcutaneously weekly. Patient spoke with endocrinology who recommended she continue Ozempic 1 mg subcutaneously weekly and to use stoole softeners and laxatives as needed to improve constipation. Patient reports restarting Ozempic 1 mg on 03/14/21. She plans to see gastrointestinal specialist this afternoon.  Side effects thought to be attributed to current medication regimen: constipation and decreased appetite from Ozempic Continue current medications per endocrinology Patient identified as a good candidate for continuous glucose monitor. Recommend Dexcom G6. Prescription to DME for sensor, transmitter, and reader device; however, per [employer], her insurance does not cover. Unclear what requirements she is not meeting but suspect we would need to increase finger stick blood glucose testing to three time daily per [facility] requirements since she is already on insulin injections four times daily. Will continue to follow.  Hypertension Verapamil may also be contributing to constipation  Coronary artery disease/Hyperlipidemia: Continue pravastatin 80 mg by mouth once daily for now. Discussed whether she would prefer to increase to high intensity statin or add ezetimibe to lower LDL to goal. Patient would prefer to only take one medication. Therefore, would recommend switching from pravastatin to rosuvastatin 20 or 40 mg by mouth daily. Patient prefers to wait until her gastrointestinal issues resolve so will discuss more at future visits.   Subjective: Jennifer Blackburn is an 75 y.o. year old female who is a primary patient of Moshe Cipro,  Norwood Levo, MD.  The CCM team was consulted for assistance with disease management and care coordination needs.    Engaged with patient by telephone for follow up visit in response to provider referral for pharmacy case management and/or care coordination services.   Consent to Services:  The patient was given information about Chronic Care Management services, agreed to services, and gave verbal consent prior to initiation of services.  Please see initial visit note for detailed documentation.   Patient Care Team: Fayrene Helper, MD as PCP - General Gwenlyn Found Pearletha Forge, MD as PCP - Cardiology (Cardiology) Sherlynn Stalls, MD (Ophthalmology) Lorretta Harp, MD as Consulting Physician (Cardiology) Lenard Simmer, MD as Attending Physician (Endocrinology) Gala Romney Cristopher Estimable, MD as Consulting Physician (Gastroenterology) Cassandria Anger, MD as Consulting Physician (Endocrinology) Beryle Lathe, [facility] (Pharmacist)  Objective:  Lab Results  Component Value Date   CREATININE 0.92 01/28/2021   CREATININE 0.98 01/12/2021   CREATININE 0.83 07/18/2020    Lab Results  Component Value Date   HGBA1C 10.1 (A) 12/23/2020   Last diabetic Eye exam:  Lab Results  Component Value Date/Time   HMDIABEYEEXA No Retinopathy 10/28/2020 12:00 AM    Last diabetic Foot exam:  Lab Results  Component Value Date/Time   HMDIABFOOTEX yes 09/24/1999 12:00 AM        Component Value Date/Time   CHOL 171 01/28/2021 1147   TRIG 126 01/28/2021 1147   HDL 43 01/28/2021 1147   CHOLHDL 4.0 01/28/2021 1147   CHOLHDL 3.7 09/04/2019 1043   VLDL 30 07/13/2016 1020   LDLCALC 105 (H) 01/28/2021 1147   [facility] 91 09/04/2019 1043    Hepatic Function Latest Ref Rng & Units 01/12/2021 07/18/2020 05/07/2019  Total Protein 6.5 - 8.1  g/dL 8.3(H) 7.6 7.8  Albumin 3.5 - 5.0 g/dL 4.1 4.2 -  AST 15 - 41 U/L '28 27 17  ' ALT 0 - 44 U/L '21 20 18  ' Alk Phosphatase 38 - 126 U/L 43 50 -  Total Bilirubin 0.3  - 1.2 mg/dL 0.4 0.2 0.3  Bilirubin, Direct 0.0 - 0.2 mg/dL <0.1 - -    Lab Results  Component Value Date/Time   TSH 0.40 08/07/2020 04:39 PM   TSH 1.60 05/09/2020 02:28 PM   FREET4 1.19 08/07/2020 04:39 PM   FREET4 1.22 05/09/2020 02:28 PM    CBC Latest Ref Rng & Units 01/12/2021 07/18/2020 05/07/2019  WBC 4.0 - 10.5 K/uL 8.0 7.5 7.5  Hemoglobin 12.0 - 15.0 g/dL 12.0 12.4 12.2  Hematocrit 36.0 - 46.0 % 38.9 38.3 38.3  Platelets 150 - 400 K/uL 307 - 283    Lab Results  Component Value Date/Time   VD25OH 36.4 01/28/2021 11:47 AM   VD25OH 38.9 07/18/2020 11:20 AM    Clinical ASCVD: No  The 10-year ASCVD risk score (Arnett DK, et al., 2019) is: 27.3%   Values used to calculate the score:     Age: 66 years     Sex: Female     Is Non-Hispanic African American: Yes     Diabetic: Yes     Tobacco smoker: No     Systolic Blood Pressure: 102 mmHg     Is BP treated: Yes     HDL Cholesterol: 43 mg/dL     Total Cholesterol: 171 mg/dL    Social History   Tobacco Use  Smoking Status Never   Passive exposure: Yes  Smokeless Tobacco Never  Tobacco Comments   Husband smokes in the home   BP Readings from Last 3 Encounters:  03/04/21 127/68  01/21/21 107/68  01/12/21 (!) 101/52   Pulse Readings from Last 3 Encounters:  01/21/21 64  01/12/21 67  12/25/20 65   Wt Readings from Last 3 Encounters:  01/21/21 188 lb 0.6 oz (85.3 kg)  01/12/21 187 lb (84.8 kg)  12/25/20 191 lb 1.9 oz (86.7 kg)    Assessment: Review of patient past medical history, allergies, medications, health status, including review of consultants reports, laboratory and other test data, was performed as part of comprehensive evaluation and provision of chronic care management services.   SDOH:  (Social Determinants of Health) assessments and interventions performed:    CCM Care Plan  Allergies  Allergen Reactions   Lipitor [Atorvastatin Calcium] Other (See Comments)    Muscle aches   Actos  [Pioglitazone] Other (See Comments)    Peripheral edema   Crestor [Rosuvastatin] Other (See Comments)    Muscle aches   Daypro [Oxaprozin] Hives   Nsaids Hives   Sulfonamide Derivatives Hives    Medications Reviewed Today     Reviewed by Beryle Lathe, [place] (Pharmacist) on 03/19/21 at 1134  Med List Status: <None>   Medication Order Taking? Sig Documenting Provider Last Dose Status Informant  acetaminophen (TYLENOL) 500 MG tablet [id number] Yes Take 1,000 mg by mouth as needed (for pain.). [provider] Taking Active Self  Cetirizine HCl 10 MG TBDP [id number] Yes Take one tablet by mouth two times daily for allergy  Patient taking differently: Take 1 tablet by mouth daily.   Fayrene Helper, MD Taking Active Self  Cholecalciferol (VITAMIN D3) 50 MCG (2000 UT) TABS [id number] Yes Take 2,000 Units by mouth daily. [provider] Taking Active Self  furosemide (LASIX) 20  MG tablet [id number] Yes Take 1 tablet (20 mg total) by mouth daily. Fayrene Helper, MD Taking Active   gabapentin (NEURONTIN) 100 MG capsule [id number] Yes TAKE 1 CAPSULE BY MOUTH 3 TIMES A DAY Fayrene Helper, MD Taking Active   glucose blood (ACCU-CHEK GUIDE) test strip [id number]  1 each by Other route as needed for other. Use as instructed to check blood sugar 2 times per day dx code E11.65 Philemon Kingdom, MD  Active   hydrOXYzine (ATARAX) 10 MG tablet [id number] Yes Take one tablet twice daily and two tablets at bedtime as needed for generalized itching. Fayrene Helper, MD Taking Active   imipramine (TOFRANIL) 50 MG tablet [id number] Yes Take 1 tablet (50 mg total) by mouth at bedtime. Fayrene Helper, MD Taking Active   insulin aspart (NOVOLOG FLEXPEN) 100 UNIT/ML FlexPen [id number] Yes Inject 10-15 Units into the skin 3 (three) times daily with meals.  Patient taking differently: Inject 15 Units into the skin 3 (three) times daily with meals.   Philemon Kingdom, MD Taking  Active   insulin glargine, 1 Unit Dial, (TOUJEO SOLOSTAR) 300 UNIT/ML Solostar Pen [id number] Yes Inject 30-50 units two times daily  Patient taking differently: Inject 30 units every morning and 40 units every evening   Philemon Kingdom, MD Taking Active   Insulin Pen Needle 32G X 4 MM MISC [id number]  Use 4x a day Philemon Kingdom, MD  Active Self  isosorbide mononitrate (IMDUR) 30 MG 24 hr tablet [id number] Yes TAKE HALF A TABLET BY MOUTH EVERY DAY Lorretta Harp, MD Taking Active   Lancets [facility] MULTICLIX) lancets [id number]  Use as instructed three times daily dx 250.01 Fayrene Helper, MD  Active Self  levothyroxine (SYNTHROID) 137 MCG tablet [id number] Yes TAKE 1 TABLET BY MOUTH DAILY BEFORE BREAKFAST. Philemon Kingdom, MD Taking Active   metFORMIN (GLUCOPHAGE) 1000 MG tablet [id number] Yes Take 1 tablet (1,000 mg total) by mouth 2 (two) times daily. Fayrene Helper, MD Taking Active   metoprolol tartrate (LOPRESSOR) 50 MG tablet [id number] Yes Take 1 tablet (50 mg total) by mouth 2 (two) times daily. Lorretta Harp, MD Taking Active   PARoxetine (PAXIL) 20 MG tablet [id number] Yes TAKE 1 TABLET BY MOUTH EVERY DAY Fayrene Helper, MD Taking Active   potassium chloride SA (KLOR-CON) 20 MEQ tablet [id number] Yes Take 1 tablet (20 mEq total) by mouth daily. Fayrene Helper, MD Taking Active   pravastatin (PRAVACHOL) 80 MG tablet [id number] Yes Take 1 tablet (80 mg total) by mouth daily. Fayrene Helper, MD Taking Active   quinapril (ACCUPRIL) 10 MG tablet [id number] Yes Take 1 tablet (10 mg total) by mouth daily. Fayrene Helper, MD Taking Active   Semaglutide, 1 MG/DOSE, (OZEMPIC, 1 MG/DOSE,) 4 MG/3ML SOPN [id number] Yes Inject 1 mg into the skin once a week. Philemon Kingdom, MD Taking Active   verapamil (CALAN-SR) 180 MG CR tablet [id number] Yes TAKE 1 TABLET BY MOUTH EVERYDAY AT BEDTIME Lorretta Harp, MD Taking Active   vitamin B-12 (CYANOCOBALAMIN) 1000 MCG  tablet [id number] Yes Take 1 tablet (1,000 mcg total) by mouth daily. Fayrene Helper, MD Taking Active Self            Patient Active Problem List   Diagnosis Date Noted   Left knee pain 01/25/2021   Breast mass, right 12/25/2020   Conjunctivitis 09/23/2020   Bloating 06/26/2020   Nausea without vomiting 06/26/2020   Constipation 06/06/2020   Belching 06/06/2020  History of gastric ulcer 05/07/2019   Muscle pain 02/07/2019   Fatigue 02/07/2019   Dysphagia 11/11/2018   History of colonic polyps 11/11/2018   Obesity (BMI 30.0-34.9) 11/05/2018   Cervical spondylosis with radiculopathy 06/20/2017   Hypertension    Hyperlipidemia    Depression    CAD (coronary artery disease)    Anxiety    Osteoarthritis 02/17/2017   Family history of coronary artery disease in father 02/17/2017   Lumbar spondylosis with myelopathy 07/18/2016   Laryngopharyngeal reflux (LPR) 06/24/2016   Type 2 diabetes mellitus with vascular disease ([place]) 01/13/2015   Insomnia due to stress 08/20/2014   GERD (gastroesophageal reflux disease) 04/11/2014   At high risk for falls 12/31/2013   Postsurgical hypothyroidism 01/05/2013   Snoring 01/05/2013   Back pain with radiculopathy 11/28/2012   Anxiety and depression 10/16/2011   Anemia 03/30/2011   Posterior right knee pain 01/24/2011   Vitamin D deficiency 10/14/2010   Bilateral carotid bruits 10/13/2010   Allergic rhinitis 11/23/2007   CARPAL TUNNEL SYNDROME, BILATERAL 04/14/2007   Coronary atherosclerosis 04/14/2007   Headache 04/14/2007   Thyroid cancer ([place]) 03/23/1999    Immunization History  Administered Date(s) Administered   Fluad Quad(high Dose 65+) 12/18/2018, 12/25/2019, 01/21/2021   Influenza Split 03/10/2012   Influenza Whole 01/04/2007   Influenza,inj,Quad PF,6+ Mos 11/28/2012, 12/31/2013, 01/31/2017, 01/10/2018   Influenza,inj,quad, With Preservative 12/20/2016   Influenza-Unspecified 02/09/2016   Moderna SARS-COV2 Booster  Vaccination 01/24/2020   Moderna Sars-Covid-2 Vaccination 04/29/2019, 05/30/2019, 01/03/2021   Pneumococcal Conjugate-13 07/15/2014   Pneumococcal Polysaccharide-23 02/17/2004, 09/07/2012   Tdap 12/21/2010    Conditions to be addressed/monitored: CAD, HTN, HLD, and DMII  Care Plan : Medication Management  Updates made by Beryle Lathe, [place] since 03/19/2021 12:00 AM     Problem: T2DM, CAD/HLD, HTN   Priority: High  Onset Date: 02/03/2021     Long-Range Goal: Disease Progression Prevention   Start Date: 02/03/2021  Expected End Date: 05/04/2021  Recent Progress: On track  Priority: High  Note:   Current Barriers:  Unable to independently monitor therapeutic efficacy Unable to achieve control of diabetes and hyperlipidemia  Pharmacist Clinical Goal(s):  Patient will Achieve adherence to monitoring guidelines and medication adherence to achieve therapeutic efficacy Achieve control of diabetes and hyperlipidemia as evidenced by improved fasting blood sugar, improved A1c, and improved LDL through collaboration with PharmD and provider.   Interventions: 1:1 collaboration with Fayrene Helper, MD regarding development and update of comprehensive plan of care as evidenced by provider attestation and co-signature Inter-disciplinary care team collaboration (see longitudinal plan of care) Comprehensive medication review performed; medication list updated in electronic medical record  Type 2 Diabetes - Goal on Track (progressing): YES.: Followed with Dr. Cruzita Lederer (endocrinology) Uncontrolled; Most recent A1c above goal of <7% per ADA guidelines Current medications: metformin 1,000 mg by mouth twice daily with meals, semaglutide (Ozempic) 1 mg subcutaneously weekly, insulin glargine (Toujeo) 30 units every morning and 40 units every evening and insulin aspart (Novolog) 15 units subcutaneously 5-15 minutes before each meal Patient is approved for patient assistance through  [employer] for Ozempic (PAP completed by endocrinology).  Intolerances: pioglitazone (swelling) and glipizide (lack of efficacy) Taking medications as directed: no, patient reports taking 2 weeks off Ozempic due to constipation issues after increasing from 0.5 mg to 1 mg subcutaneously weekly. Patient spoke with endocrinology who recommended she continue Ozempic 1 mg subcutaneously weekly and to use stoole softeners and laxatives as needed to improve constipation. Patient reports restarting  Ozempic 1 mg on 03/14/21. She plans to see gastrointestinal specialist this afternoon.  Side effects thought to be attributed to current medication regimen: constipation and decreased appetite from Ozempic Reports 1 episode of hypoglycemic symptoms (sweaty and shaky) on 03/08/21 after she took her bolus insulin and only ate piece of toast. She did not check her blood glucose but treated it with a peppermint candy and then ate a snack which resolved her symptoms. Denies hyperglycemic symptoms (excessive thirst, excessive hunger, and excessive urination. Hypoglycemia prevention: may need to consider Current meal patterns: breakfast: eggs and 1 piece of toast and sometimes some fruit (banana) or sometimes cereal ; lunch:  1 piece of toast with can of peas or carrot or other vegetable ; dinner: baked chicken or pork chops or fried fish with beans or vegetables with bread; snacks: crackers, vegetables, and fruit ; beverages: water, skim milk, and coffee with creamer (not sugar free); will sometimes use Stevia in her tea Current exercise: not active due to injury from recent falls On a statin: yes On aspirin 81 mg daily: no Last microalbumin: 13 (01/28/21); on an ACEi/ARB: yes Last eye exam: completed within last year Last foot exam: completed within last year Pneumonia vaccine: series complete Influenza vaccine: up to date Shingrix: overdue; series not started. Patient can receive at their pharmacy.  TDAP: overdue  (last 2012). Patient can receive at their pharmacy. Current glucose readings: patient current checks blood glucose twice daily (fasting and after lunch/before dinner); fasting blood glucose:  119, 130, 129, 142, 138 ; post prandial glucose:  180-190s Previously discussed diet and how to improved by eating less cereal, bread, fruit and milk Continue current medications as above per endocrinology Patient identified as a good candidate for continuous glucose monitor. Recommend Dexcom G6. Prescription to DME for sensor, transmitter, and reader device; however, per [employer], her insurance does not cover. Unclear what requirements she is not meeting but suspect we would need to increase finger stick blood glucose testing to three time daily per [facility] requirements since she is already on insulin injections four times daily. Will continue to follow.  Hypertension - Condition stable. Not addressed this visit.: Blood pressure under good control. Blood pressure is at goal of <130/80 mmHg per 2017 AHA/ACC guidelines. Current medications: quinapril 10 mg by mouth once daily, verapamil SR 180 mg by mouth at bedtime, metoprolol tartrate 50 mg by mouth twice daily, and furosemide 20 mg once daily Intolerances: none Taking medications as directed: yes Side effects thought to be attributed to current medication regimen: maybe, will monitor closely for continued dizziness/fainting; verapamil may also be contributing to constipation Current home blood pressure: not discussed today due to time limitations Recommend home blood pressure monitoring to discuss at next visit Continue current medications as above  Coronary artery disease/Hyperlipidemia - Goal on Track (progressing): YES.: Uncontrolled. LDL above goal of <70 due to very high risk given 10-year risk >20% per 2020 AACE/ACE guidelines. Triglycerides at goal of <150 per 2020 AACE/ACE guidelines. Current medications: pravastatin 80 mg by mouth  once daily Patient is also taking a nitrate (isosorbide), beta blocker, and ACE inhibitor Intolerances: atorvastatin (muscle aches) Taking medications as directed: yes Side effects thought to be attributed to current medication regimen: no Continue pravastatin 80 mg by mouth once daily for now. Discussed whether she would prefer to increase to high intensity statin or add ezetimibe to lower LDL to goal. Patient would prefer to only take one medication. Therefore, would recommend switching from  pravastatin to rosuvastatin 20 or 40 mg by mouth daily. Patient prefers to wait until her gastrointestinal issues resolve so will discuss more at future visits.  Encourage dietary reduction of high fat containing foods such as butter, nuts, bacon, egg yolks, etc. Reviewed risks of hyperlipidemia, principles of treatment and consequences of untreated hyperlipidemia Discussed need for medication compliance Re-check lipid panel in 4-12 weeks  Patient Goals/Self-Care Activities Patient will:  Focus on medication adherence by keeping up with prescription refills and either using a pill box or reminders to take your medications at the prescribed times Check blood sugar twice a day at the following times: fasting (at least 8 hours since last food consumption), 1-2 hours after lunch, and whenever patient experiences symptoms of hypo/hyperglycemia, document, and provide at future appointments Check blood pressure at least once daily, document, and provide at future appointments Engage in dietary modifications by less frequent dining out, decreased fat intake, fewer sweetened foods & beverages, and better food choices  Follow Up Plan: Face-to-face follow up appointment with care management team member scheduled for: 04/23/21      Medication Assistance:  Ozempic through [employer]  Patient's preferred pharmacy is:  [id number] [place] [place]Phone: [phone]: [phone] [facility] [place] [id number]: [phone]: [phone] Follow Up:  Patient agrees to Care Plan and Follow-up.  Plan: Face to Face appointment with care management team member scheduled for: 04/23/21  CKennon Holter PharmD, [place] CPP Clinical Pharmacist Practitioner [facility] [phone]

## 2021-03-19 NOTE — Patient Instructions (Signed)
It was good to see you in the office again today!  Constipation and GERD information provided  Plan to take one half capful of MiraLAX every night to achieve a bowel movement daily to every other day.  May increase to 1 capful as needed.  You will likely do best if you take MiraLAX every night.  To get your bowels moving,  would go home this afternoon and take a capful and take a second capful tomorrow morning depending on results  Resume pantoprazole or Protonix 40 mg daily each morning (dispense 30 with 11 refills)  Office visit in 3 months  Please call in the interim if her plan is not working satisfactorily.  Happy new year!

## 2021-03-21 DIAGNOSIS — E782 Mixed hyperlipidemia: Secondary | ICD-10-CM | POA: Diagnosis not present

## 2021-03-21 DIAGNOSIS — I10 Essential (primary) hypertension: Secondary | ICD-10-CM | POA: Diagnosis not present

## 2021-03-21 DIAGNOSIS — I2510 Atherosclerotic heart disease of native coronary artery without angina pectoris: Secondary | ICD-10-CM | POA: Diagnosis not present

## 2021-03-21 DIAGNOSIS — I2583 Coronary atherosclerosis due to lipid rich plaque: Secondary | ICD-10-CM | POA: Diagnosis not present

## 2021-03-21 DIAGNOSIS — E1159 Type 2 diabetes mellitus with other circulatory complications: Secondary | ICD-10-CM

## 2021-03-24 ENCOUNTER — Other Ambulatory Visit: Payer: Self-pay | Admitting: Cardiovascular Disease

## 2021-03-24 ENCOUNTER — Telehealth: Payer: Self-pay

## 2021-03-24 ENCOUNTER — Other Ambulatory Visit: Payer: Self-pay

## 2021-03-24 DIAGNOSIS — I10 Essential (primary) hypertension: Secondary | ICD-10-CM

## 2021-03-24 MED ORDER — QUINAPRIL HCL 10 MG PO TABS: 10.0000 mg | ORAL_TABLET | Freq: Every day | ORAL | 1 refills | Status: DC

## 2021-03-24 MED ORDER — PRAVASTATIN SODIUM 80 MG PO TABS: 80.0000 mg | ORAL_TABLET | Freq: Every day | ORAL | 1 refills | Status: DC

## 2021-03-24 MED ORDER — HYDROXYZINE HCL 10 MG PO TABS: ORAL_TABLET | ORAL | 1 refills | Status: DC

## 2021-03-24 MED ORDER — IMIPRAMINE HCL 50 MG PO TABS: 50.0000 mg | ORAL_TABLET | Freq: Every day | ORAL | 2 refills | Status: DC

## 2021-03-24 NOTE — Telephone Encounter (Signed)
[place] called following up on rx  for  Hydroyzine 20 mg Quijnapril 10 mg Pravastatin 80 mg Imipramine 50 mg  Please return [place] at [phone]

## 2021-03-24 NOTE — Telephone Encounter (Signed)
Meds refilled.

## 2021-04-10 ENCOUNTER — Telehealth: Payer: Self-pay

## 2021-04-10 NOTE — Telephone Encounter (Signed)
Called and left a message for pt to pick up patient assistance Ozempic (1 box) from office.

## 2021-04-21 ENCOUNTER — Other Ambulatory Visit: Payer: Self-pay

## 2021-04-21 DIAGNOSIS — E1159 Type 2 diabetes mellitus with other circulatory complications: Secondary | ICD-10-CM | POA: Diagnosis not present

## 2021-04-21 DIAGNOSIS — E559 Vitamin D deficiency, unspecified: Secondary | ICD-10-CM | POA: Diagnosis not present

## 2021-04-21 DIAGNOSIS — E782 Mixed hyperlipidemia: Secondary | ICD-10-CM | POA: Diagnosis not present

## 2021-04-23 ENCOUNTER — Encounter: Payer: Medicare HMO | Admitting: Family Medicine

## 2021-04-23 ENCOUNTER — Other Ambulatory Visit: Payer: Self-pay

## 2021-04-23 ENCOUNTER — Ambulatory Visit (INDEPENDENT_AMBULATORY_CARE_PROVIDER_SITE_OTHER): Payer: Medicare HMO | Admitting: Pharmacist

## 2021-04-23 VITALS — BP 100/62 | HR 82 | Ht 65.0 in | Wt 183.0 lb

## 2021-04-23 DIAGNOSIS — E782 Mixed hyperlipidemia: Secondary | ICD-10-CM

## 2021-04-23 DIAGNOSIS — I10 Essential (primary) hypertension: Secondary | ICD-10-CM

## 2021-04-23 DIAGNOSIS — E1159 Type 2 diabetes mellitus with other circulatory complications: Secondary | ICD-10-CM

## 2021-04-23 DIAGNOSIS — I2583 Coronary atherosclerosis due to lipid rich plaque: Secondary | ICD-10-CM

## 2021-04-23 DIAGNOSIS — I2510 Atherosclerotic heart disease of native coronary artery without angina pectoris: Secondary | ICD-10-CM

## 2021-04-23 LAB — BMP8+EGFR
BUN/Creatinine Ratio: 17 (ref 12–28)
BUN: 14 mg/dL (ref 8–27)
CO2: 27 mmol/L (ref 20–29)
Calcium: 9.3 mg/dL (ref 8.7–10.3)
Chloride: 99 mmol/L (ref 96–106)
Creatinine, Ser: 0.82 mg/dL (ref 0.57–1.00)
Glucose: 175 mg/dL — ABNORMAL HIGH (ref 70–99)
Potassium: 4.8 mmol/L (ref 3.5–5.2)
Sodium: 141 mmol/L (ref 134–144)
eGFR: 74 mL/min/{1.73_m2} (ref >59)

## 2021-04-23 LAB — LIPID PANEL
Chol/HDL Ratio: 3.4 ratio (ref 0.0–4.4)
Cholesterol, Total: 126 mg/dL (ref 100–199)
HDL: 37 mg/dL — ABNORMAL LOW (ref >39)
LDL Chol Calc (NIH): 70 mg/dL (ref 0–99)
Triglycerides: 103 mg/dL (ref 0–149)
VLDL Cholesterol Cal: 19 mg/dL (ref 5–40)

## 2021-04-23 LAB — MICROALBUMIN / CREATININE URINE RATIO
Creatinine, Urine: 171 mg/dL
Microalb/Creat Ratio: 12 mg/g creat (ref 0–29)
Microalbumin, Urine: 20.9 ug/mL

## 2021-04-23 LAB — VITAMIN D 25 HYDROXY (VIT D DEFICIENCY, FRACTURES): Vit D, 25-Hydroxy: 52.2 ng/mL (ref 30.0–100.0)

## 2021-04-23 MED ORDER — ACCU-CHEK GUIDE VI STRP: ORAL_STRIP | 3 refills | Status: DC

## 2021-04-23 NOTE — Patient Instructions (Signed)
Antoine Primas,  It was great to talk to you today!  Please call me with any questions or concerns.  Visit Information  Following are the goals we discussed today:   Goals Addressed             This Visit's Progress    Medication Management       Patient Goals/Self-Care Activities Patient will:  Focus on medication adherence by keeping up with prescription refills and either using a pill box or reminders to take your medications at the prescribed times Check blood sugar twice a day at the following times: fasting (at least 8 hours since last food consumption), 1-2 hours after lunch, and whenever patient experiences symptoms of hypo/hyperglycemia, document, and provide at future appointments Check blood pressure at least once daily, document, and provide at future appointments Engage in dietary modifications by less frequent dining out, fewer sweetened foods & beverages, and better food choices           Follow-up plan: Telephone follow up appointment with care management team member scheduled for:  06/24/21  Patient verbalizes understanding of instructions and care plan provided today and agrees to view in [place]. Active MyChart status confirmed with patient.    Please call the care guide team at [phone] if you need to cancel or reschedule your appointment.   Kennon Holter, PharmD, [place], CPP Clinical Pharmacist Practitioner [facility] [phone]

## 2021-04-23 NOTE — Chronic Care Management (AMB) (Signed)
Chronic Care Management Pharmacy Note  04/23/2021 Name:  Jennifer Blackburn MRN:  [id number] DOB:  1945/10/13  Summary: General: Recent labs were review with patient LDL improved from 105 to 70 BMP within normal limits except glucose elevated Vitamin D within normal limits Urinary microalbumin/creatine ration stable at 12  Vaccines Shingrix: overdue; series not started. Patient can receive at their pharmacy.  TDAP: overdue (last 2012). Patient can receive at their pharmacy.  Type 2 Diabetes Followed with Dr. Cruzita Lederer (endocrinology) Patient is approved for patient assistance through [employer] for [place] for [employer] (PAP completed by endocrinology).  Hypoglycemia prevention: may need to consider Current glucose readings: patient current checks blood glucose twice daily (fasting and after lunch/before dinner). Patient did not being blood glucose meter or logs today. Patient requests refill on test strips. Prescription sent to primary care provider for refill approval. Continue current medications as above per endocrinology. As blood glucose improves, may need to decrease insulin requirements especially if able to maximize Ozempic.  Hyperlipidemia/Coronary artery disease Controlled. LDL at goal of <70 due to very high risk given 10-year risk >20% per 2020 AACE/ACE guidelines. Triglycerides at goal of <150 per 2020 AACE/ACE guidelines. Continue pravastatin 80 mg by mouth once daily  Subjective: Jennifer Blackburn is an 76 y.o. year old female who is a primary patient of Fayrene Helper, MD.  The CCM team was consulted for assistance with disease management and care coordination needs.    Engaged with patient face to face for follow up visit in response to provider referral for pharmacy case management and/or care coordination services.   Consent to Services:  The patient was given information about Chronic Care Management services, agreed to services, and gave verbal  consent prior to initiation of services.  Please see initial visit note for detailed documentation.   Patient Care Team: Fayrene Helper, MD as PCP - General Gwenlyn Found Pearletha Forge, MD as PCP - Cardiology (Cardiology) Sherlynn Stalls, MD (Ophthalmology) Lorretta Harp, MD as Consulting Physician (Cardiology) Lenard Simmer, MD as Attending Physician (Endocrinology) Gala Romney Cristopher Estimable, MD as Consulting Physician (Gastroenterology) Cassandria Anger, MD as Consulting Physician (Endocrinology) Beryle Lathe, [facility] (Pharmacist)  Objective:  Lab Results  Component Value Date   CREATININE 0.82 04/21/2021   CREATININE 0.92 01/28/2021   CREATININE 0.98 01/12/2021    Lab Results  Component Value Date   HGBA1C 10.1 (A) 12/23/2020   Last diabetic Eye exam:  Lab Results  Component Value Date/Time   HMDIABEYEEXA No Retinopathy 10/28/2020 12:00 AM    Last diabetic Foot exam:  Lab Results  Component Value Date/Time   HMDIABFOOTEX yes 09/24/1999 12:00 AM        Component Value Date/Time   CHOL 126 04/21/2021 1102   TRIG 103 04/21/2021 1102   HDL 37 (L) 04/21/2021 1102   CHOLHDL 3.4 04/21/2021 1102   CHOLHDL 3.7 09/04/2019 1043   VLDL 30 07/13/2016 1020   LDLCALC 70 04/21/2021 1102   [place] 91 09/04/2019 1043    Hepatic Function Latest Ref Rng & Units 01/12/2021 07/18/2020 05/07/2019  Total Protein 6.5 - 8.1 g/dL 8.3(H) 7.6 7.8  Albumin 3.5 - 5.0 g/dL 4.1 4.2 -  AST 15 - 41 U/L '28 27 17  ' ALT 0 - 44 U/L '21 20 18  ' Alk Phosphatase 38 - 126 U/L 43 50 -  Total Bilirubin 0.3 - 1.2 mg/dL 0.4 0.2 0.3  Bilirubin, Direct 0.0 - 0.2 mg/dL <0.1 - -  Lab Results  Component Value Date/Time   TSH 0.40 08/07/2020 04:39 PM   TSH 1.60 05/09/2020 02:28 PM   FREET4 1.19 08/07/2020 04:39 PM   FREET4 1.22 05/09/2020 02:28 PM    CBC Latest Ref Rng & Units 01/12/2021 07/18/2020 05/07/2019  WBC 4.0 - 10.5 K/uL 8.0 7.5 7.5  Hemoglobin 12.0 - 15.0 g/dL 12.0 12.4 12.2  Hematocrit  36.0 - 46.0 % 38.9 38.3 38.3  Platelets 150 - 400 K/uL 307 - 283    Lab Results  Component Value Date/Time   VD25OH 52.2 04/21/2021 11:02 AM   VD25OH 36.4 01/28/2021 11:47 AM    Clinical ASCVD: No  The ASCVD Risk score (Arnett DK, et al., 2019) failed to calculate for the following reasons:   The valid total cholesterol range is 130 to 320 mg/dL    Social History   Tobacco Use  Smoking Status Never   Passive exposure: Yes  Smokeless Tobacco Never  Tobacco Comments   Husband smokes in the home   BP Readings from Last 3 Encounters:  04/23/21 100/62  03/19/21 120/69  03/04/21 127/68   Pulse Readings from Last 3 Encounters:  04/23/21 82  03/19/21 75  01/21/21 64   Wt Readings from Last 3 Encounters:  04/23/21 183 lb (83 kg)  03/19/21 188 lb (85.3 kg)  01/21/21 188 lb 0.6 oz (85.3 kg)    Assessment: Review of patient past medical history, allergies, medications, health status, including review of consultants reports, laboratory and other test data, was performed as part of comprehensive evaluation and provision of chronic care management services.   SDOH:  (Social Determinants of Health) assessments and interventions performed:    CCM Care Plan  Allergies  Allergen Reactions   Lipitor [Atorvastatin Calcium] Other (See Comments)    Muscle aches   Actos [Pioglitazone] Other (See Comments)    Peripheral edema   Crestor [Rosuvastatin] Other (See Comments)    Muscle aches   Daypro [Oxaprozin] Hives   Nsaids Hives   Sulfonamide Derivatives Hives    Medications Reviewed Today     Reviewed by Beryle Lathe, [place] (Pharmacist) on 04/23/21 at 1401  Med List Status: <None>   Medication Order Taking? Sig Documenting Provider Last Dose Status Informant  acetaminophen (TYLENOL) 500 MG tablet [id number] Yes Take 1,000 mg by mouth as needed (for pain.). [provider] Taking Active Self  Cetirizine HCl 10 MG TBDP [id number] Yes Take one tablet by mouth two  times daily for allergy  Patient taking differently: Take 1 tablet by mouth daily.   Fayrene Helper, MD Taking Active Self  Cholecalciferol (VITAMIN D3) 50 MCG (2000 UT) TABS [id number] Yes Take 2,000 Units by mouth daily. [provider] Taking Active Self  furosemide (LASIX) 20 MG tablet [id number] Yes Take 1 tablet (20 mg total) by mouth daily. Fayrene Helper, MD Taking Active   gabapentin (NEURONTIN) 100 MG capsule [id number] Yes TAKE 1 CAPSULE BY MOUTH 3 TIMES A DAY Fayrene Helper, MD Taking Active   glucose blood (ACCU-CHEK GUIDE) test strip [id number] Yes Use as instructed to check blood sugar 2 times per day dx code E11.65 Fayrene Helper, MD Taking Active   hydrOXYzine (ATARAX) 10 MG tablet [id number] Yes Take one tablet twice daily and two tablets at bedtime as needed for generalized itching. Fayrene Helper, MD Taking Active   imipramine (TOFRANIL) 50 MG tablet [id number] Yes Take 1 tablet (50 mg total) by mouth at bedtime. Fayrene Helper, MD Taking  Active   insulin aspart (NOVOLOG FLEXPEN) 100 UNIT/ML FlexPen [id number] Yes Inject 10-15 Units into the skin 3 (three) times daily with meals.  Patient taking differently: Inject 15 Units into the skin 3 (three) times daily with meals.   Philemon Kingdom, MD Taking Active   insulin glargine, 1 Unit Dial, (TOUJEO SOLOSTAR) 300 UNIT/ML Solostar Pen [id number] Yes Inject 30-50 units two times daily  Patient taking differently: Inject 30 units every morning and 40 units every evening   Philemon Kingdom, MD Taking Active   Insulin Pen Needle 32G X 4 MM MISC [id number] Yes Use 4x a day Philemon Kingdom, MD Taking Active Self  isosorbide mononitrate (IMDUR) 30 MG 24 hr tablet [id number] Yes TAKE HALF A TABLET BY MOUTH EVERY DAY Lorretta Harp, MD Taking Active   Lancets [facility] MULTICLIX) lancets [id number] Yes Use as instructed three times daily dx 250.01 Fayrene Helper, MD Taking Active Self  levothyroxine  (SYNTHROID) 137 MCG tablet [id number] Yes TAKE 1 TABLET BY MOUTH DAILY BEFORE BREAKFAST. Philemon Kingdom, MD Taking Active   metFORMIN (GLUCOPHAGE) 1000 MG tablet [id number] Yes Take 1 tablet (1,000 mg total) by mouth 2 (two) times daily. Fayrene Helper, MD Taking Active   metoprolol tartrate (LOPRESSOR) 50 MG tablet [id number] Yes Take 1 tablet (50 mg total) by mouth 2 (two) times daily. Lorretta Harp, MD Taking Active   pantoprazole (PROTONIX) 40 MG tablet [id number] Yes Take 1 tablet (40 mg total) by mouth daily. Daneil Dolin, MD Taking Active   PARoxetine (PAXIL) 20 MG tablet [id number] Yes TAKE 1 TABLET BY MOUTH EVERY DAY Fayrene Helper, MD Taking Active   potassium chloride SA (KLOR-CON) 20 MEQ tablet [id number] Yes Take 1 tablet (20 mEq total) by mouth daily. Fayrene Helper, MD Taking Active   pravastatin (PRAVACHOL) 80 MG tablet [id number] Yes Take 1 tablet (80 mg total) by mouth daily. Fayrene Helper, MD Taking Active   quinapril (ACCUPRIL) 10 MG tablet [id number] Yes Take 1 tablet (10 mg total) by mouth daily. Fayrene Helper, MD Taking Active   Semaglutide, 1 MG/DOSE, (OZEMPIC, 1 MG/DOSE,) 4 MG/3ML SOPN [id number] Yes Inject 1 mg into the skin once a week. Philemon Kingdom, MD Taking Active            Med Note Jim Like Apr 23, 2021  1:46 PM) Frances Maywood from [facility] patient assistance program  verapamil (CALAN-SR) 180 MG CR tablet [id number] Yes TAKE 1 TABLET BY MOUTH EVERYDAY AT BEDTIME Lorretta Harp, MD Taking Active   vitamin B-12 (CYANOCOBALAMIN) 1000 MCG tablet [id number] Yes Take 1 tablet (1,000 mcg total) by mouth daily. Fayrene Helper, MD Taking Active Self            Patient Active Problem List   Diagnosis Date Noted   Left knee pain 01/25/2021   Breast mass, right 12/25/2020   Conjunctivitis 09/23/2020   Bloating 06/26/2020   Nausea without vomiting 06/26/2020   Constipation 06/06/2020   Belching 06/06/2020    History of gastric ulcer 05/07/2019   Muscle pain 02/07/2019   Fatigue 02/07/2019   Dysphagia 11/11/2018   History of colonic polyps 11/11/2018   Obesity (BMI 30.0-34.9) 11/05/2018   Cervical spondylosis with radiculopathy 06/20/2017   Hypertension    Hyperlipidemia    Depression    CAD (coronary artery disease)    Anxiety    Osteoarthritis 02/17/2017   Family history of coronary artery disease in father 02/17/2017   Lumbar spondylosis  with myelopathy 07/18/2016   Laryngopharyngeal reflux (LPR) 06/24/2016   Type 2 diabetes mellitus with vascular disease ([place]) 01/13/2015   Insomnia due to stress 08/20/2014   GERD (gastroesophageal reflux disease) 04/11/2014   At high risk for falls 12/31/2013   Postsurgical hypothyroidism 01/05/2013   Snoring 01/05/2013   Back pain with radiculopathy 11/28/2012   Anxiety and depression 10/16/2011   Anemia 03/30/2011   Posterior right knee pain 01/24/2011   Vitamin D deficiency 10/14/2010   Bilateral carotid bruits 10/13/2010   Allergic rhinitis 11/23/2007   CARPAL TUNNEL SYNDROME, BILATERAL 04/14/2007   Coronary atherosclerosis 04/14/2007   Headache 04/14/2007   Thyroid cancer ([place]) 03/23/1999    Immunization History  Administered Date(s) Administered   Fluad Quad(high Dose 65+) 12/18/2018, 12/25/2019, 01/21/2021   Influenza Split 03/10/2012   Influenza Whole 01/04/2007   Influenza,inj,Quad PF,6+ Mos 11/28/2012, 12/31/2013, 01/31/2017, 01/10/2018   Influenza,inj,quad, With Preservative 12/20/2016   Influenza-Unspecified 02/09/2016   Moderna SARS-COV2 Booster Vaccination 01/24/2020   Moderna Sars-Covid-2 Vaccination 04/29/2019, 05/30/2019, 01/03/2021   Pneumococcal Conjugate-13 07/15/2014   Pneumococcal Polysaccharide-23 02/17/2004, 09/07/2012   Tdap 12/21/2010    Conditions to be addressed/monitored: CAD, HTN, HLD, and DMII  Care Plan : Medication Management  Updates made by Beryle Lathe, [place] since 04/23/2021 12:00 AM      Problem: T2DM, CAD/HLD, HTN   Priority: High  Onset Date: 02/03/2021     Long-Range Goal: Disease Progression Prevention   Start Date: 02/03/2021  Expected End Date: 05/04/2021  Recent Progress: On track  Priority: High  Note:   Current Barriers:  Unable to independently monitor therapeutic efficacy Unable to achieve control of diabetes   Pharmacist Clinical Goal(s):  Through collaboration with PharmD and provider, patient will:  Achieve adherence to monitoring guidelines and medication adherence to achieve therapeutic efficacy Achieve control of diabetes as evidenced by improved fasting blood sugar and improved A1c  Interventions: 1:1 collaboration with Fayrene Helper, MD regarding development and update of comprehensive plan of care as evidenced by provider attestation and co-signature Inter-disciplinary care team collaboration (see longitudinal plan of care) Comprehensive medication review performed; medication list updated in electronic medical record  Type 2 Diabetes - Goal on Track (progressing): YES.: Followed with Dr. Cruzita Lederer (endocrinology) Uncontrolled; Most recent A1c above goal of <7% per ADA guidelines Current medications: metformin 1,000 mg by mouth twice daily with meals, semaglutide (Ozempic) 1 mg subcutaneously weekly, insulin glargine (Toujeo) 30 units every morning and 40 units every evening and insulin aspart (Novolog) 15 units subcutaneously 5-15 minutes before each meal Patient is approved for patient assistance through [employer] for [place] for [employer] (PAP completed by endocrinology).  Intolerances: pioglitazone (swelling) and glipizide (lack of efficacy) Taking medications as directed: yes Side effects thought to be attributed to current medication regimen: constipation and decreased appetite likely related to Ozempic. Patient reports improvement in constipation with daily use of Miralax No recent hypoglycemia; although, she does endorse  that she has hypoglycemia awareness (shakiness, sweatiness). Denies hyperglycemic symptoms (excessive thirst, excessive hunger, and excessive urination. Hypoglycemia prevention: may need to consider Current meal patterns: breakfast: eggs and 1 piece of toast and sometimes some fruit (banana) or sometimes cereal ; lunch:  1 piece of toast with can of peas or carrot or other vegetable ; dinner: baked chicken or pork chops or fried fish with beans or vegetables with bread; snacks: crackers, vegetables, and fruit ; beverages: water, skim milk, and coffee with creamer (not sugar free); will sometimes use Stevia in  her tea Current exercise: not discussed today On a statin: yes On aspirin 81 mg daily: no Last microalbumin: 13 (01/28/21); on an ACEi/ARB: yes Last eye exam: completed within last year Last foot exam: completed within last year Pneumonia vaccine: series complete Influenza vaccine: up to date Shingrix: overdue; series not started. Patient can receive at their pharmacy.  TDAP: overdue (last 2012). Patient can receive at their pharmacy. Current glucose readings: patient current checks blood glucose twice daily (fasting and after lunch/before dinner). Patient did not being blood glucose meter or logs today. Patient requests refill on test strips. Prescription sent to primary care provider for refill approval. Continue current medications as above per endocrinology. As blood glucose improves, may need to decrease insulin requirements especially if able to maximize Ozempic. Patient identified as a good candidate for continuous glucose monitor. Dexcom G6 prescription previously sent to DME; however, per [employer], her insurance does not cover. Patient instructed to continue finger stick blood glucose. Check A1c with endocrinology next week. Previously discussed diet and how to improved by eating less cereal, bread, fruit and milk  Hypertension - Condition stable. Not addressed this  visit.: Blood pressure under good control. Blood pressure is at goal of <130/80 mmHg per 2017 AHA/ACC guidelines. Current medications: quinapril 10 mg by mouth once daily, verapamil SR 180 mg by mouth at bedtime, metoprolol tartrate 50 mg by mouth twice daily, and furosemide 20 mg once daily Intolerances: none Taking medications as directed: yes Side effects thought to be attributed to current medication regimen: maybe, will monitor closely for continued dizziness/fainting; verapamil may also be contributing to constipation Current home blood pressure: not discussed today due to time limitations Recommend home blood pressure monitoring to discuss at next visit Continue current medications as above  Hyperlipidemia/Coronary artery disease - Goal Met.: Controlled. LDL at goal of <70 due to very high risk given 10-year risk >20% per 2020 AACE/ACE guidelines. Triglycerides at goal of <150 per 2020 AACE/ACE guidelines. Current medications: pravastatin 80 mg by mouth once daily Patient is also taking a nitrate (isosorbide), beta blocker, and ACE inhibitor Intolerances:  atorvastatin and rosuvastatin (muscle aches) Taking medications as directed: yes Side effects thought to be attributed to current medication regimen: no Continue pravastatin 80 mg by mouth once daily Encourage dietary reduction of high fat containing foods such as butter, nuts, bacon, egg yolks, etc.  Patient Goals/Self-Care Activities Patient will:  Focus on medication adherence by keeping up with prescription refills and either using a pill box or reminders to take your medications at the prescribed times Check blood sugar twice a day at the following times: fasting (at least 8 hours since last food consumption), 1-2 hours after lunch, and whenever patient experiences symptoms of hypo/hyperglycemia, document, and provide at future appointments Check blood pressure at least once daily, document, and provide at future  appointments Engage in dietary modifications by less frequent dining out, fewer sweetened foods & beverages, and better food choices  Follow Up Plan: Telephone follow up appointment with care management team member scheduled for: 06/24/21      Medication Assistance: approved for patient assistance through [place] for [place] and [employer] for [employer]  Patient's preferred pharmacy is:  [id number] [place], [id number]: [phone]: [phone] [place] [place] [place]Phone: [phone]: [phone] Follow Up:  Patient agrees to Care Plan and Follow-up.  Plan: Telephone follow up appointment with care management team member scheduled for:  06/24/21  Kennon Holter, PharmD, [place], [place] Clinical Pharmacist Practitioner [facility] [phone]

## 2021-04-28 ENCOUNTER — Other Ambulatory Visit: Payer: Self-pay

## 2021-04-28 ENCOUNTER — Ambulatory Visit: Payer: Medicare HMO | Admitting: Internal Medicine

## 2021-04-28 ENCOUNTER — Encounter: Payer: Self-pay | Admitting: Internal Medicine

## 2021-04-28 VITALS — BP 130/78 | HR 70 | Ht 65.0 in | Wt 185.8 lb

## 2021-04-28 DIAGNOSIS — E890 Postprocedural hypothyroidism: Secondary | ICD-10-CM

## 2021-04-28 DIAGNOSIS — E1159 Type 2 diabetes mellitus with other circulatory complications: Secondary | ICD-10-CM

## 2021-04-28 DIAGNOSIS — C73 Malignant neoplasm of thyroid gland: Secondary | ICD-10-CM

## 2021-04-28 LAB — POCT GLYCOSYLATED HEMOGLOBIN (HGB A1C): Hemoglobin A1C: 8.8 % — AB (ref 4.0–5.6)

## 2021-04-28 NOTE — Patient Instructions (Signed)
Please continue: - Metformin 1000 mg 2x a day with meals. - Toujeo 30 units in a.m. and 40 units at bedtime - Novolog 10-15 units before each meal - Ozempic 1 mg weekly  Please continue levothyroxine 137 mcg daily.  Take the thyroid hormone every day, with water, at least 30 minutes before breakfast, separated by at least 4 hours from: - acid reflux medications - calcium - iron - multivitamins  Please return in 3-4 months with your sugar log.

## 2021-04-28 NOTE — Progress Notes (Signed)
Patient ID: Jennifer Blackburn, female   DOB: 1945/05/03, 76 y.o.   MRN: [id number]   This visit occurred during the SARS-CoV-2 public health emergency.  Safety protocols were in place, including screening questions prior to the visit, additional usage of staff PPE, and extensive cleaning of exam room while observing appropriate contact time as indicated for disinfecting solutions.    HPI: Jennifer Blackburn is a 76 y.o.-year-old female, returning for follow-up for DM2, dx in 2001, insulin-dependent since 2017, uncontrolled, with long-term complications (DR, CAD) and also papillary thyroid cancer and postsurgical hypothyroidism.  Last visit 4 months ago.  Interim history: No increased urination, blurry vision, nausea, chest pain. She has constipation resistant to prune juice, MiraLAX, Linzess.   DM2: Reviewed HbA1c levels: Lab Results  Component Value Date   HGBA1C 10.1 (A) 12/23/2020   HGBA1C 9.0 (A) 08/07/2020   HGBA1C 10.5 (A) 05/09/2020   HGBA1C 9.7 (A) 01/04/2020   HGBA1C 8.4 (A) 08/29/2019   HGBA1C 9.3 (A) 05/03/2019   HGBA1C 8.8 (A) 12/28/2018   HGBA1C 8.9 (A) 09/21/2018   HGBA1C 8.4 (A) 05/26/2018   HGBA1C 8.0 (A) 02/07/2018  01/12/2018: HbA1c 8.7% 08/05/2017: HbA1c 8.9% 07/11/2017: HbA1c 9.1%  Pt is on a regimen of: - Metformin 1000 mg 2x a day with meals. - Toujeo 30 units in a.m. and 50 units at bedtime >> 30 units in a.m. and 40 units at bedtime - Lispro 8-10 >> 10 mg 3x a day 15 min before dinner >> NovoLog 10 -15 units before each meal - missing less doses - Ozempic 0.5 >> off >> 1  mg weekly -restarted at last visit (through the patient assistance program) -initial GI intolerance, not tolerated well She was on Ozempic 0.5 mg weekly in a.m. >> 1 mg  - constipation >> stopped 06/2018.  We added it back 09/2018. She was on Victoza >> $$$. She was on Jardiance >> $$$. She was on Actos >> swelling - stopped Spring 2019. She was on Glipizide XL 5 mg daily >> stopped 07/2017.   We retried glipizide 5 mg twice a day but had to stop 12/2018 due to lack of effect.  Pt checks her sugars 1-2 times a day: - am: 99, 189-208, 270 >> 99, 130-180 >> 180-200 >> 120-130 - 2h after b'fast: n/c - before lunch: n/c - 2h after lunch: 265-300s >> n/c >> 200s >> 200s >> n/c - before dinner: n/c >> 185-190 >> n/c - 2h after dinner: n/c - bedtime: 160-260 >> n/c >> 160-180 >> 170-180s >> 160s - nighttime: n/c Lowest sugar was 99 >> 99 >> 170 >> 86; she has hypoglycemia awareness in the 60s. Highest sugar was 270 >> 200s >> 200 >> 180s.  Glucometer: AccuChek Aviva  Pt's meals are: - Breakfast:toast, cereal, milk; oatmeal; bacon + egg >> oatmeal + coffee - Lunch: skips >> 2 eggs, fruit - Dinner: hamburger, or chicken, or fish, + veggies, bread, water and tea - Snacks: 2- fruit She was seeing a dietitian in [place]. She walks for exercise.  -No CKD, last BUN/creatinine:  Lab Results  Component Value Date   BUN 14 04/21/2021   BUN 15 01/28/2021   CREATININE 0.82 04/21/2021   CREATININE 0.92 01/28/2021  On quinapril.  -+ HL; last set of lipids: Lab Results  Component Value Date   CHOL 126 04/21/2021   HDL 37 (L) 04/21/2021   LDLCALC 70 04/21/2021   TRIG 103 04/21/2021   CHOLHDL 3.4 04/21/2021  On pravastatin 80.  -  last eye exam was in 10/2020: No DR . Dr. Eulas Post in [place].  She has cataracts OU.   -No numbness and tingling in her feet. Has a podiatrist.   On ASA 81.  Pt has no FH of DM.  H/o Thyroid cancer (2001), s/p RAI treatment, now with postsurgical hypothyroidism.  Previously on brand-name Synthroid, now generic levothyroxine.  Pt is on levothyroxine 137 mcg daily, taken: - in am - fasting - at least 30 min from b'fast - no calcium - no iron - no multivitamins - + PPIs (Protonix) moved during the day - not on Biotin  Reviewed her TFTs: Lab Results  Component Value Date   TSH 0.40 08/07/2020   TSH 1.60 05/09/2020   TSH 0.05 (L)  08/31/2019   TSH 0.04 (L) 12/28/2018   TSH 0.06 (L) 09/21/2018   TSH 0.61 05/26/2018   TSH 2.49 03/23/2016   TSH 0.070 (L) 10/13/2010   TSH 0.035 (L) 09/22/2006   TSH 0.015 (L) 06/17/2006  11/03/2017: TSH 0.044  Her thyroglobulin levels are detectable: Lab Results  Component Value Date   THYROGLB 0.4 (L) 08/07/2020   THYROGLB 1.2 (L) 05/09/2020   THYROGLB 0.4 (L) 08/31/2019   THYROGLB 0.3 (L) 09/21/2018   THYROGLB 0.7 (L) 05/26/2018   Lab Results  Component Value Date   THGAB <1 08/07/2020   THGAB <1 05/09/2020   THGAB <1 08/31/2019   THGAB <1 09/21/2018   THGAB <1 05/26/2018   Reviewed records from Dr. Ronnald Collum: Patient is status post total thyroidectomy in 2004 1.3 cm papillary thyroid cancer of the right lobe, followed by 3x RAI tx's with  33 mCi I-131 and a fourth RAI dose inpatient: 125 mCi, at [facility].    Whole-body scan on 09/08/2007 showed an increase in the thyroid bed activity and the follow up study with PET scan 10/08/2007 showed mildly hypermetabolic cervical lymph nodes.    Repeat PET 05/10/2008 showed no significant changes felt likely to be due to to a reactive process.  11/03/2017: TSH 0.044, thyroglobulin 0.2, ATA <1.0  04/05/2017: TSH 0.021 (0.45-4.5), thyroglobulin 0.3 (by IMA), ATA <1.0  Reviewed imaging test reports: Thyroid ultrasound (06/10/2018): 1.1 cm nonspecific calcified lesion in the right upper neck.  CT was recommended: There is no residual or recurrent tissue in the right or left thyroid beds. There is no evidence of abnormal adenopathy by short axis diameter measurement criteria There is a nonspecific calcified soft tissue mass measuring 1.1 cm in the right superior neck. This is of unknown significance. CT neck can be performed to further characterize.  CT (10/18/2018): Status post thyroidectomy. No soft tissue mass within the thyroidectomy bed. A 13 x 7 mm ovoid focus of calcification in the right aspect of the thyroidectomy bed likely  corresponding with the finding on recent neck ultrasound and is unchanged as compared to neck CT 10/24/2007, benign.   No pathologically enlarged cervical chain lymph nodes. A nonenlarged calcified right supraclavicular lymph node is new from prior neck CT 10/24/2007 but also favored benign. Attention recommended on follow-up.  Neck U/S (06/03/2020): Ultrasound of the neck demonstrates no evidence of visible residual thyroid tissue or abnormal soft tissue mass.   Ovoid area of densely shadowing calcification again noted to the right of midline as seen by prior ultrasound and also demonstrated by prior CT studies of the neck with documented stability between 2009 and 2020 studies.   Small left cervical lymph node has a similar appearance to the prior ultrasound study measuring 0.5  cm in short axis.   IMPRESSION: No evidence by ultrasound of recurrent thyroid malignancy in the neck.  Stable right neck calcification which has been documented to be benign and stable by prior CT.  Small left cervical lymph nodedemonstrates stable appearance and size by ultrasound  compared to the 2020 ultrasound.   Pt denies: - feeling nodules in neck - hoarseness - dysphagia - choking - SOB with lying down  She also has a history of HTN and anemia.  Her daughter lives in [place] and she is sick.  Patient travels between here in [place] frequently.   ROS: + See HPI Musculoskeletal: + muscle aches/+ joint aches  I reviewed pt's medications, allergies, PMH, social hx, family hx, and changes were documented in the history of present illness. Otherwise, unchanged from my initial visit note.  Past Medical History:  Diagnosis Date   Anxiety    Arthritis    CAD (coronary artery disease)    Depression    Diabetes mellitus type II    without complication   DJD (degenerative joint disease) of lumbar spine    Hypercholesteremia    Hyperlipidemia    Hypertension    benign     Hypothyroidism    Thyroid cancer ([place]) 2001   Past Surgical History:  Procedure Laterality Date   ABDOMINAL HYSTERECTOMY     CARDIAC CATHETERIZATION     CATARACT EXTRACTION W/PHACO Right 07/17/2012   Procedure: CATARACT EXTRACTION PHACO AND INTRAOCULAR LENS PLACEMENT ([place]);  Surgeon: Tonny Branch, MD;  Location: [facility];  Service: Ophthalmology;  Laterality: Right;  CDE:25.51   CATARACT EXTRACTION W/PHACO Left 09/17/2016   Procedure: CATARACT EXTRACTION PHACO AND INTRAOCULAR LENS PLACEMENT LEFT EYE;  Surgeon: Tonny Branch, MD;  Location: [facility];  Service: Ophthalmology;  Laterality: Left;  CDE: 19.23   COLONOSCOPY     COLONOSCOPY N/A 01/15/2014   Procedure: COLONOSCOPY;  Surgeon: Daneil Dolin, MD;  Location: [facility] SUITE;  Service: Endoscopy;  Laterality: N/A;  9:00 AM   COLONOSCOPY WITH PROPOFOL N/A 01/25/2019   Procedure: COLONOSCOPY WITH PROPOFOL;  Surgeon: Daneil Dolin, MD; diverticulosis in the entire colon, otherwise normal exam.  No repeat colonoscopy due to age.   DOPPLER ECHOCARDIOGRAPHY     ESOPHAGOGASTRODUODENOSCOPY (EGD) WITH PROPOFOL N/A 01/25/2019   Procedure: ESOPHAGOGASTRODUODENOSCOPY (EGD) WITH PROPOFOL;  Surgeon: Daneil Dolin, MD; normal esophagus without dilation due to inability to pass dilator beyond the hypopharynx, 1 small nonbleeding gastric ulcer s/p biopsied, otherwise normal exam.  Pathology with ulcer with reactive changes, no H. pylori, metaplasia, dysplasia, or malignancy.   ESOPHAGOGASTRODUODENOSCOPY (EGD) WITH PROPOFOL N/A 08/21/2019   Procedure: ESOPHAGOGASTRODUODENOSCOPY (EGD) WITH PROPOFOL;  Surgeon: Daneil Dolin, MD; Normal esophagus. Previously noted gastric ulcer completely healed. Normal examined duodenum.    JOINT REPLACEMENT  07/01/2010   left hip   KNEE SURGERY Right    arthroscopy   left hip replaced  07/01/2010   SPINE SURGERY  2006   cervical   stress dipyridamole myocardial perfusion     THYROIDECTOMY     TONSILLECTOMY     VESICOVAGINAL  FISTULA CLOSURE W/ TAH     Social History   Socioeconomic History   Marital status: Married    Spouse name: Not on file   Number of children: 2   Years of education: 14   Highest education level: Not on file  Occupational History   Occupation: retired     Comment: bank  Tobacco Use   Smoking status: Never  Passive exposure: Yes   Smokeless tobacco: Never   Tobacco comments:    Husband smokes in the home  Vaping Use   Vaping Use: Never used  Substance and Sexual Activity   Alcohol use: No   Drug use: No   Sexual activity: Never  Other Topics Concern   Not on file  Social History Narrative   Lives with husband, at home   No caffeine   Social Determinants of Health   Financial Resource Strain: Low Risk    Difficulty of Paying Living Expenses: Not hard at all  Food Insecurity: No Food Insecurity   Worried About Charity fundraiser in the Last Year: Never true   [place] in the Last Year: Never true  Transportation Needs: No Transportation Needs   Lack of Transportation (Medical): No   Lack of Transportation (Non-Medical): No  Physical Activity: Insufficiently Active   Days of Exercise per Week: 3 days   Minutes of Exercise per Session: 20 min  Stress: No Stress Concern Present   Feeling of Stress : Not at all  Social Connections: Socially Integrated   Frequency of Communication with Friends and Family: More than three times a week   Frequency of Social Gatherings with Friends and Family: More than three times a week   Attends Religious Services: More than 4 times per year   Active Member of [employer] or Organizations: Yes   Attends Music therapist: More than 4 times per year   Marital Status: Married  Human resources officer Violence: Not At Risk   Fear of Current or Ex-Partner: No   Emotionally Abused: No   Physically Abused: No   Sexually Abused: No   Current Outpatient Medications  Medication Sig Dispense Refill   acetaminophen (TYLENOL) 500 MG  tablet Take 1,000 mg by mouth as needed (for pain.).     Cetirizine HCl 10 MG TBDP Take one tablet by mouth two times daily for allergy (Patient taking differently: Take 1 tablet by mouth daily.) 60 tablet 5   Cholecalciferol (VITAMIN D3) 50 MCG (2000 UT) TABS Take 2,000 Units by mouth daily.     furosemide (LASIX) 20 MG tablet Take 1 tablet (20 mg total) by mouth daily. 90 tablet 1   gabapentin (NEURONTIN) 100 MG capsule TAKE 1 CAPSULE BY MOUTH 3 TIMES A DAY 270 capsule 1   glucose blood (ACCU-CHEK GUIDE) test strip Use as instructed to check blood sugar 2 times per day dx code E11.65 100 each 3   hydrOXYzine (ATARAX) 10 MG tablet Take one tablet twice daily and two tablets at bedtime as needed for generalized itching. 360 tablet 1   imipramine (TOFRANIL) 50 MG tablet Take 1 tablet (50 mg total) by mouth at bedtime. 90 tablet 2   insulin aspart (NOVOLOG FLEXPEN) 100 UNIT/ML FlexPen Inject 10-15 Units into the skin 3 (three) times daily with meals. (Patient taking differently: Inject 15 Units into the skin 3 (three) times daily with meals.) 15 mL 2   insulin glargine, 1 Unit Dial, (TOUJEO SOLOSTAR) 300 UNIT/ML Solostar Pen Inject 30-50 units two times daily (Patient taking differently: Inject 30 units every morning and 40 units every evening) 10.5 mL 2   Insulin Pen Needle 32G X 4 MM MISC Use 4x a day 300 each 3   isosorbide mononitrate (IMDUR) 30 MG 24 hr tablet TAKE HALF A TABLET BY MOUTH EVERY DAY 45 tablet 3   Lancets (ACCU-CHEK MULTICLIX) lancets Use as instructed three times daily  dx 250.01 100 each 5   levothyroxine (SYNTHROID) 137 MCG tablet TAKE 1 TABLET BY MOUTH DAILY BEFORE BREAKFAST. 90 tablet 1   metFORMIN (GLUCOPHAGE) 1000 MG tablet Take 1 tablet (1,000 mg total) by mouth 2 (two) times daily. 180 tablet 1   metoprolol tartrate (LOPRESSOR) 50 MG tablet Take 1 tablet (50 mg total) by mouth 2 (two) times daily. 180 tablet 3   pantoprazole (PROTONIX) 40 MG tablet Take 1 tablet (40 mg total)  by mouth daily. 30 tablet 11   PARoxetine (PAXIL) 20 MG tablet TAKE 1 TABLET BY MOUTH EVERY DAY 90 tablet 1   potassium chloride SA (KLOR-CON) 20 MEQ tablet Take 1 tablet (20 mEq total) by mouth daily. 30 tablet 3   pravastatin (PRAVACHOL) 80 MG tablet Take 1 tablet (80 mg total) by mouth daily. 90 tablet 1   quinapril (ACCUPRIL) 10 MG tablet Take 1 tablet (10 mg total) by mouth daily. 90 tablet 1   Semaglutide, 1 MG/DOSE, (OZEMPIC, 1 MG/DOSE,) 4 MG/3ML SOPN Inject 1 mg into the skin once a week. 9 mL 3   verapamil (CALAN-SR) 180 MG CR tablet TAKE 1 TABLET BY MOUTH EVERYDAY AT BEDTIME 90 tablet 3   vitamin B-12 (CYANOCOBALAMIN) 1000 MCG tablet Take 1 tablet (1,000 mcg total) by mouth daily. 30 tablet 5   No current facility-administered medications for this visit.     Allergies  Allergen Reactions   Lipitor [Atorvastatin Calcium] Other (See Comments)    Muscle aches   Actos [Pioglitazone] Other (See Comments)    Peripheral edema   Crestor [Rosuvastatin] Other (See Comments)    Muscle aches   Daypro [Oxaprozin] Hives   Nsaids Hives   Sulfonamide Derivatives Hives   Family History  Problem Relation Age of Onset   Heart attack Father    Heart failure Mother    Asthma Daughter    Sleep apnea Son        CPAP   Colon cancer Neg Hx    PE: BP 130/78 (BP Location: Right Arm, Patient Position: Sitting, Cuff Size: Normal)    Pulse 70    Ht 5\' 5"  (1.651 m)    Wt 185 lb 12.8 oz (84.3 kg)    LMP 11/11/2016    SpO2 98%    BMI 30.92 kg/m  Wt Readings from Last 3 Encounters:  04/28/21 185 lb 12.8 oz (84.3 kg)  04/23/21 183 lb (83 kg)  03/19/21 188 lb (85.3 kg)   Constitutional: overweight, in NAD Eyes: PERRLA, EOMI, no exophthalmos ENT: moist mucous membranes, no thyromegaly, no cervical lymphadenopathy Cardiovascular: RRR, No MRG Respiratory: CTA B Musculoskeletal: no deformities, strength intact in all 4 Skin: moist, warm, no rashes Neurological: no tremor with outstretched hands,  DTR normal in all 4  ASSESSMENT: 1. DM2, insulin-dependent, uncontrolled, with long-term complications - CAD - DR  2.  Papillary thyroid cancer  3.  Postsurgical hypothyroidism  PLAN:  1. Patient with longstanding, uncontrolled, type 2 diabetes, on metformin, basal-bolus insulin regimen and weekly GLP-1 receptor agonist, started at last visit at last visit, sugars are higher, all above target in the morning and also high throughout the day, after she relaxed her diet (traveling, taking care of a sick relative).  Discussed about improving diet and starting regular physical activity at that time and I also recommended to restart Ozempic through the patient assistance program.  I sent a prescription for the [place] system to her pharmacy to see if this was covered.  I also demonstrated  how it works. At last visit, HbA1c was higher, at 10.1%.  -At today's visit, sugars appear to be improved and they are mostly at goal.  No low blood sugars.  She describes that she was only able to start Ozempic less than 2 months ago.  Sugars did improve afterwards.  She had some GI symptoms initially after starting it, but they improved.  For now, we discussed about continuing the same dose of Ozempic.  She is doing a better job remembering to take NovoLog.  We continued the same dose today. - I suggested to:  Patient Instructions  Please continue: - Metformin 1000 mg 2x a day with meals. - Toujeo 30 units in a.m. and 40 units at bedtime - Novolog 10-15 units before each meal - Ozempic 1 mg weekly  Please continue levothyroxine 137 mcg daily.  Take the thyroid hormone every day, with water, at least 30 minutes before breakfast, separated by at least 4 hours from: - acid reflux medications - calcium - iron - multivitamins  Please return in 3-4 months with your sugar log.  - we checked her HbA1c: 8.8% (improving) - advised to check sugars at different times of the day - 3-4x a day, rotating check times -  advised for yearly eye exams >> she is UTD - return to clinic in 3-4 months   2.  Papillary thyroid cancer -Previous thyroid cancer records were reviewed from Dr. Ronnald Collum -She has metastatic thyroid cancer and had 4 RAI treatments.  She also had increased signal on the PET scan from 2009, however, in 2010, another PET scan showed possible inflammatory lymph nodes in the area only -Her thyroglobulin levels were detectable but stable in the past.  She had a high level, in 04/2020, but we repeated a thyroglobulin level last year and this returned to baseline. -Latest neck ultrasound (05/2020) did not show any new concerning masses.  Previous neck ultrasounds also showed a specific calcified lesion in the right upper neck and a CT scan of the neck in 2019 showed that the calcified mass was stable, therefore, most likely benign -we will recheck Tg + ATA at next visit  3.  Postsurgical hypothyroidism - latest thyroid labs reviewed with pt. >> normal: Lab Results  Component Value Date   TSH 0.40 08/07/2020  - she continues on LT4 137 mcg daily - pt feels good on this dose. - we discussed about taking the thyroid hormone every day, with water, >30 minutes before breakfast, separated by >4 hours from acid reflux medications, calcium, iron, multivitamins. Pt. is taking it correctly. - will check thyroid tests at next visit  Philemon Kingdom, MD PhD [facility]

## 2021-04-28 NOTE — Addendum Note (Signed)
Addended by: Lauralyn Primes on: 04/28/2021 01:30 PM   Modules accepted: Orders

## 2021-04-29 ENCOUNTER — Other Ambulatory Visit: Payer: Self-pay | Admitting: Family Medicine

## 2021-05-14 ENCOUNTER — Ambulatory Visit: Payer: Medicare HMO | Admitting: Gastroenterology

## 2021-05-19 DIAGNOSIS — I10 Essential (primary) hypertension: Secondary | ICD-10-CM

## 2021-05-19 DIAGNOSIS — E1159 Type 2 diabetes mellitus with other circulatory complications: Secondary | ICD-10-CM | POA: Diagnosis not present

## 2021-05-19 DIAGNOSIS — E782 Mixed hyperlipidemia: Secondary | ICD-10-CM

## 2021-05-19 DIAGNOSIS — I2583 Coronary atherosclerosis due to lipid rich plaque: Secondary | ICD-10-CM

## 2021-05-19 DIAGNOSIS — I2510 Atherosclerotic heart disease of native coronary artery without angina pectoris: Secondary | ICD-10-CM | POA: Diagnosis not present

## 2021-05-22 ENCOUNTER — Ambulatory Visit (INDEPENDENT_AMBULATORY_CARE_PROVIDER_SITE_OTHER): Payer: Medicare HMO | Admitting: Family Medicine

## 2021-05-22 ENCOUNTER — Encounter: Payer: Self-pay | Admitting: Family Medicine

## 2021-05-22 ENCOUNTER — Other Ambulatory Visit: Payer: Self-pay

## 2021-05-22 VITALS — BP 120/68 | HR 88 | Resp 16 | Ht 65.0 in | Wt 181.0 lb

## 2021-05-22 DIAGNOSIS — I10 Essential (primary) hypertension: Secondary | ICD-10-CM

## 2021-05-22 DIAGNOSIS — E559 Vitamin D deficiency, unspecified: Secondary | ICD-10-CM

## 2021-05-22 DIAGNOSIS — D539 Nutritional anemia, unspecified: Secondary | ICD-10-CM

## 2021-05-22 DIAGNOSIS — Z0000 Encounter for general adult medical examination without abnormal findings: Secondary | ICD-10-CM | POA: Insufficient documentation

## 2021-05-22 DIAGNOSIS — E782 Mixed hyperlipidemia: Secondary | ICD-10-CM

## 2021-05-22 NOTE — Assessment & Plan Note (Signed)

## 2021-05-22 NOTE — Patient Instructions (Signed)
F/U end September  or early October, call if you need me before ? ?Fasting lipid, cmp and eGFr, B12, CBC and vit D early Septemeber ? ?It is important that you exercise regularly at least 30 minutes 5 times a week. If you develop chest pain, have severe difficulty breathing, or feel very tired, stop exercising immediately and seek medical attention  ? ?Think about what you will eat, plan ahead. ?Choose " clean, green, fresh or frozen" over canned, processed or packaged foods which are more sugary, salty and fatty. ?70 to 75% of food eaten should be vegetables and fruit. ?Three meals at set times with snacks allowed between meals, but they must be fruit or vegetables. ?Aim to eat over a 12 hour period , example 7 am to 7 pm, and STOP after  your last meal of the day. ?Drink water,generally about 64 ounces per day, no other drink is as healthy. Fruit juice is best enjoyed in a healthy way, by EATING the fruit. ? ?Careful not to fall! ? ?Thanks for choosing [facility], we consider it a privelige to serve you. ? ?

## 2021-05-25 ENCOUNTER — Encounter: Payer: Self-pay | Admitting: Family Medicine

## 2021-05-25 NOTE — Progress Notes (Signed)
? ? ?  Jennifer Blackburn     MRN: [id number]      DOB: [birth year] ? ?HPI: ?Patient is in for annual physical exam. ?No other health concerns are expressed or addressed at the visit. ?Recent labs,  are reviewed. ?Immunization is reviewed , and  updated if needed. ? ? ?PE: ?BP 120/68   Pulse 88   Resp 16   Ht '5\' 5"'$  (1.651 m)   Wt 181 lb (82.1 kg)   LMP 11/11/2016   SpO2 95%   BMI 30.12 kg/m?  ? ?Pleasant  female, alert and oriented x 3, in no cardio-pulmonary distress. ?Afebrile. ?HEENT ?No facial trauma or asymetry. Sinuses non tender.  ?Extra occullar muscles intact.Marland Kitchen ?External ears normal, . ?Neck: decreased ROM, no adenopathy,JVD or thyromegaly.No bruits. ? ?Chest: ?Clear to ascultation bilaterally.No crackles or wheezes. ?Non tender to palpation ? ?Cardiovascular system; ?Heart sounds normal,  S1 and  S2 ,no S3.  No murmur, or thrill. ?Apical beat not displaced ?Peripheral pulses normal. ? ?Abdomen: ?Soft, non tender, no organomegaly or masses. ?No bruits. ?Bowel sounds normal. ?No guarding, tenderness or rebound. ? ? ? ?Musculoskeletal exam: ?Decreased  ROM of spine, hips , shoulders and knees. ?No deformity ,swelling or crepitus noted. ?No muscle wasting or atrophy.  ? ?Neurologic: ?Cranial nerves 2 to 12 intact. ?Power, tone ,sensation and reflexes normal throughout. ?Mild disturbance in gait. No tremor. ? ?Skin: ?Intact, no ulceration, erythema , scaling or rash noted. ?Pigmentation normal throughout ? ?Psych; ?Normal mood and affect. Judgement and concentration normal ? ? ?Assessment & Plan:  ?Annual physical exam ?Annual exam as documented. ?Counseling done  re healthy lifestyle involving commitment to 150 minutes exercise per week, heart healthy diet, and attaining healthy weight.The importance of adequate sleep also discussed. ?Regular seat belt use and home safety, is also discussed. ?Changes in health habits are decided on by the patient with goals and time frames  set for achieving  them. ?Immunization and cancer screening needs are specifically addressed at this visit. ? ?

## 2021-05-27 ENCOUNTER — Other Ambulatory Visit: Payer: Self-pay

## 2021-05-27 ENCOUNTER — Ambulatory Visit: Payer: Medicare HMO | Admitting: Cardiovascular Disease

## 2021-05-27 ENCOUNTER — Encounter: Payer: Self-pay | Admitting: Cardiovascular Disease

## 2021-05-27 DIAGNOSIS — I2510 Atherosclerotic heart disease of native coronary artery without angina pectoris: Secondary | ICD-10-CM | POA: Diagnosis not present

## 2021-05-27 DIAGNOSIS — E782 Mixed hyperlipidemia: Secondary | ICD-10-CM

## 2021-05-27 DIAGNOSIS — I10 Essential (primary) hypertension: Secondary | ICD-10-CM | POA: Diagnosis not present

## 2021-05-27 NOTE — Assessment & Plan Note (Signed)
History of minimal CAD by cath back in June 2005.  She had a negative Myoview stress test performed 02/23/2017.  She denies chest pain. ?

## 2021-05-27 NOTE — Assessment & Plan Note (Addendum)
History of hyperlipidemia on statin therapy with lipid profile performed 04/21/2021 revealing a total cholesterol 126, LDL of 78 and HDL 37. ?

## 2021-05-27 NOTE — Progress Notes (Signed)
? ? ? ?05/27/2021 ?Jennifer Blackburn   ?[birth year]  ?[id number] ? ?Primary Physician Fayrene Helper, MD ?Primary Cardiologist: Lorretta Harp MD [name], [place] ? ?HPI:  Jennifer Blackburn is a 76 y.o.  moderately overweight, married [place] American female, mother of 2, grandmother to 4 grandchildren who I last saw in the office  04/09/2020. She has a history of minimal CAD by cath back in June 2005 with normal LV function. Her other problems include treated hypertension, diabetes and dyslipidemia.  She does have a strong family history of heart disease with a father that died of an MI at age 31. Most recent lipid profile performed by Dr. Moshe Cipro on 11/14/14 revealing a total cholesterol 175, LDL of 112 and HDL of 42.. Dr. Moshe Cipro continues to follow her with profile . She has had several episodes of nocturnal chest pressure which has awakened her from sleep with a "heavy sensation on her chest and a feeling of diaphoresis since I last saw her but these are infrequent and have not changed frequency or severity. She did see Kerin Ransom in the office 02/17/17 complaining of chest pain and a Myoview stress test performed 02/23/17 was low risk.  ?  ?Since I saw her in the office 1 year ago she continues to do well.  She denies chest pain or shortness of breath. ? ? ? ?Current Meds  ?Medication Sig  ? acetaminophen (TYLENOL) 500 MG tablet Take 1,000 mg by mouth as needed (for pain.).  ? Cetirizine HCl 10 MG TBDP Take one tablet by mouth two times daily for allergy (Patient taking differently: Take 1 tablet by mouth daily.)  ? Cholecalciferol (VITAMIN D3) 50 MCG (2000 UT) TABS Take 2,000 Units by mouth daily.  ? furosemide (LASIX) 20 MG tablet TAKE 1 TABLET BY MOUTH EVERY DAY  ? gabapentin (NEURONTIN) 100 MG capsule TAKE 1 CAPSULE BY MOUTH 3 TIMES A DAY  ? glucose blood (ACCU-CHEK GUIDE) test strip Use as instructed to check blood sugar 2 times per day dx code E11.65  ? hydrOXYzine (ATARAX) 10 MG tablet Take one  tablet twice daily and two tablets at bedtime as needed for generalized itching.  ? imipramine (TOFRANIL) 50 MG tablet Take 1 tablet (50 mg total) by mouth at bedtime.  ? insulin aspart (NOVOLOG FLEXPEN) 100 UNIT/ML FlexPen Inject 10-15 Units into the skin 3 (three) times daily with meals. (Patient taking differently: Inject 15 Units into the skin 3 (three) times daily with meals.)  ? insulin glargine, 1 Unit Dial, (TOUJEO SOLOSTAR) 300 UNIT/ML Solostar Pen Inject 30-50 units two times daily (Patient taking differently: Inject 30 units every morning and 40 units every evening)  ? Insulin Pen Needle 32G X 4 MM MISC Use 4x a day  ? isosorbide mononitrate (IMDUR) 30 MG 24 hr tablet TAKE HALF A TABLET BY MOUTH EVERY DAY  ? Lancets (ACCU-CHEK MULTICLIX) lancets Use as instructed three times daily dx 250.01  ? levothyroxine (SYNTHROID) 137 MCG tablet TAKE 1 TABLET BY MOUTH DAILY BEFORE BREAKFAST.  ? metFORMIN (GLUCOPHAGE) 1000 MG tablet Take 1 tablet (1,000 mg total) by mouth 2 (two) times daily.  ? metoprolol tartrate (LOPRESSOR) 50 MG tablet Take 1 tablet (50 mg total) by mouth 2 (two) times daily.  ? pantoprazole (PROTONIX) 40 MG tablet Take 1 tablet (40 mg total) by mouth daily.  ? PARoxetine (PAXIL) 20 MG tablet TAKE 1 TABLET BY MOUTH EVERY DAY  ? potassium chloride SA (KLOR-CON) 20 MEQ tablet Take  1 tablet (20 mEq total) by mouth daily.  ? pravastatin (PRAVACHOL) 80 MG tablet Take 1 tablet (80 mg total) by mouth daily.  ? quinapril (ACCUPRIL) 10 MG tablet Take 1 tablet (10 mg total) by mouth daily.  ? Semaglutide, 1 MG/DOSE, (OZEMPIC, 1 MG/DOSE,) 4 MG/3ML SOPN Inject 1 mg into the skin once a week.  ? verapamil (CALAN-SR) 180 MG CR tablet TAKE 1 TABLET BY MOUTH EVERYDAY AT BEDTIME  ? vitamin B-12 (CYANOCOBALAMIN) 1000 MCG tablet Take 1 tablet (1,000 mcg total) by mouth daily.  ?  ? ?Allergies  ?Allergen Reactions  ? Lipitor [Atorvastatin Calcium] Other (See Comments)  ?  Muscle aches  ? Actos [Pioglitazone] Other  (See Comments)  ?  Peripheral edema  ? Crestor [Rosuvastatin] Other (See Comments)  ?  Muscle aches  ? Daypro [Oxaprozin] Hives  ? Nsaids Hives  ? Sulfonamide Derivatives Hives  ? ? ?Social History  ? ?Socioeconomic History  ? Marital status: Married  ?  Spouse name: Not on file  ? Number of children: 2  ? Years of education: 29  ? Highest education level: Not on file  ?Occupational History  ? 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 Smoking status: Never  ?  Passive exposure: Yes  ? Smokeless tobacco: Never  ? Tobacco comments:  ?  Husband smokes in the home  ?Vaping Use  ? Vaping Use: Never used  ?Substance and Sexual Activity  ? Alcohol use: No  ? Drug use: No  ? Sexual activity: Never  ?Other Topics Concern  ? Not on file  ?Social History Narrative  ? Lives with husband, at home  ? No caffeine  ? ?Social Determinants of Health  ? ?Financial Resource Strain: Low Risk   ? Difficulty of Paying Living Expenses: Not hard at all  ?Food Insecurity: No Food Insecurity  ? Worried About Charity fundraiser in the Last Year: Never true  ? Ran Out of Food in the Last Year: Never true  ?Transportation Needs: No Transportation Needs  ? Lack of Transportation (Medical): No  ? Lack of Transportation (Non-Medical): No  ?Physical Activity: Insufficiently Active  ? Days of Exercise per Week: 3 days  ? Minutes of Exercise per Session: 20 min  ?Stress: No Stress Concern Present  ? Feeling of Stress : Not at all  ?Social Connections: Socially Integrated  ? Frequency of Communication with Friends and Family: More than three times a week  ? Frequency of Social Gatherings with Friends and Family: More than three times a week  ? Attends Religious Services: More than 4 times per year  ? Active Member of Clubs or Organizations: Yes  ? Attends Archivist Meetings: More than 4 times per year  ? Marital Status: Married  ?Intimate Partner Violence: Not At Risk  ? Fear of Current or Ex-Partner: No  ? Emotionally Abused:  No  ? Physically Abused: No  ? Sexually Abused: No  ?  ? ?Review of Systems: ?General: negative for chills, fever, night sweats or weight changes.  ?Cardiovascular: negative for chest pain, dyspnea on exertion, edema, orthopnea, palpitations, paroxysmal nocturnal dyspnea or shortness of breath ?Dermatological: negative for rash ?Respiratory: negative for cough or wheezing ?Urologic: negative for hematuria ?Abdominal: negative for nausea, vomiting, diarrhea, bright red blood per rectum, melena, or hematemesis ?Neurologic: negative for visual changes, syncope, or dizziness ?All other systems reviewed and are otherwise negative except as noted above. ? ? ? ?Blood  pressure 126/72, pulse 70, height '5\' 5"'$  (1.651 m), weight 185 lb 9.6 oz (84.2 kg), last menstrual period 11/11/2016, SpO2 98 %.  ?General appearance: alert and no distress ?Neck: no adenopathy, no carotid bruit, no JVD, supple, symmetrical, trachea midline, and thyroid not enlarged, symmetric, no tenderness/mass/nodules ?Lungs: clear to auscultation bilaterally ?Heart: regular rate and rhythm, S1, S2 normal, no murmur, click, rub or gallop ?Extremities: extremities normal, atraumatic, no cyanosis or edema ?Pulses: 2+ and symmetric ?Skin: Skin color, texture, turgor normal. No rashes or lesions ?Neurologic: Grossly normal ? ?EKG sinus rhythm at 70 without ST or T wave changes.  Personally reviewed this EKG. ? ?ASSESSMENT AND PLAN:  ? ?Coronary atherosclerosis ?History of minimal CAD by cath back in June 2005.  She had a negative Myoview stress test performed 02/23/2017.  She denies chest pain. ? ?Hyperlipidemia ?History of hyperlipidemia on statin therapy with lipid profile performed 04/21/2021 revealing a total cholesterol 126, LDL of 78 and HDL 37. ? ?Hypertension ?History of essential hypertension blood pressure measured today 126/72.  She is on metoprolol, quinapril and verapamil. ? ? ? ? ?Lorretta Harp MD FACP,FACC,FAHA, FSCAI ?05/27/2021 ?11:45 AM ?

## 2021-05-27 NOTE — Patient Instructions (Signed)

## 2021-05-27 NOTE — Assessment & Plan Note (Signed)
History of essential hypertension blood pressure measured today 126/72.  She is on metoprolol, quinapril and verapamil. ?

## 2021-05-30 ENCOUNTER — Other Ambulatory Visit: Payer: Self-pay | Admitting: Cardiovascular Disease

## 2021-06-02 ENCOUNTER — Other Ambulatory Visit: Payer: Self-pay

## 2021-06-02 ENCOUNTER — Encounter: Payer: Self-pay | Admitting: Family Medicine

## 2021-06-02 ENCOUNTER — Ambulatory Visit (INDEPENDENT_AMBULATORY_CARE_PROVIDER_SITE_OTHER): Payer: Medicare HMO | Admitting: Family Medicine

## 2021-06-02 VITALS — BP 104/78 | Resp 16 | Ht 65.0 in | Wt 178.8 lb

## 2021-06-02 DIAGNOSIS — I959 Hypotension, unspecified: Secondary | ICD-10-CM | POA: Diagnosis not present

## 2021-06-02 NOTE — Patient Instructions (Addendum)
F/U as before, call if you need me sooner ? ?Urgent referral to Dr Gwenlyn Found re new severe symptomatic hypotension, unknown cause ? ?BP in office today 104/78 ? ?Hold furosemide,quinapril, ? hold cardizem and only take half metoprolol this evening  ? ?Take tomorrow isosorbide mononitrate 1 in the morning ?Half metoprolol once daily ( evening)  If BP or HR are increased then take half  metoprolol twice daily  ( HR over 100 or BP 145/90 or more)_ ? ? ?Thanks for choosing [facility], we consider it a privelige to serve you. ? ?

## 2021-06-03 ENCOUNTER — Encounter: Payer: Self-pay | Admitting: Family Medicine

## 2021-06-03 ENCOUNTER — Other Ambulatory Visit (HOSPITAL_COMMUNITY): Payer: Self-pay | Admitting: Family Medicine

## 2021-06-03 ENCOUNTER — Telehealth: Payer: Self-pay | Admitting: Family Medicine

## 2021-06-03 DIAGNOSIS — Z1231 Encounter for screening mammogram for malignant neoplasm of breast: Secondary | ICD-10-CM

## 2021-06-03 NOTE — Telephone Encounter (Signed)
Patient called in regard to AVS from visit 3/15 ? ?Patient is confused on med to hold, patient states med is not on current med list  ? ?[ hold cardizem and only take half metoprolol this evening ] ? ? ? ? ?Patient wants a call back.  ?

## 2021-06-03 NOTE — Progress Notes (Signed)
? ?  Jennifer Blackburn     MRN: [id number]      DOB: Mar 16, 1946 ? ? ?HPI ?Jennifer Blackburn is here fhere c/o light headedness and very low blood pressure on awaking 2 days ago with a BP  of 90/48. She had seen her Cardiologist just 3 days before and was well controlled on her regular medication cocktail for blood pressure management. He denies fever, feeling ill and has been maintaining normal god water intake . She has no new medications ?Over the time she has withheld some of her medications, and today comes in with limited BP med in her system only taking one of 3, her  BP is low at 104/78 ?She denies chest pain, palpitation, PND or orthopnea, no leg swelling ?The sudden severe hypotension she is experiencing is concerning, urgent Cardiology  appt requested and she has been advised o limit medication she takes for BP control  ? ?ROS ?Denies recent fever or chills. ?Denies sinus pressure, nasal congestion, ear pain or sore throat. ?Denies chest congestion, productive cough or wheezing. ?Denies chest pains, palpitations and leg swelling ?Denies abdominal pain, nausea, vomiting,diarrhea or constipation.   ? ? ?PE ? ?BP 104/78   Resp 16   Ht '5\' 5"'$  (1.651 m)   Wt 178 lb 12.8 oz (81.1 kg)   LMP 11/11/2016   SpO2 97%   BMI 29.75 kg/m?  ? ?Patient alert and oriented HEENT: No facial asymmetry, EOMI,     Neck supple . ? ?Chest: Clear to auscultation bilaterally. ? ?CVS: S1, S2 no murmurs, no S3.Regular rate. ? ?ABD: Soft non tender.  ? ?Ext: No edema ? ?MS: Adequate ROM spine, shoulders, hips and knees. ? ?Skin: Intact, no ulcerations or rash noted. ? ?Psych: Good eye contact, normal affect. Memory intact not anxious or depressed appearing. ? ?CNS: CN 2-12 intact, power,  normal throughout.no focal deficits noted. ? ? ?Assessment & Plan ? ?Hypotension ?Acute severe symptomatic  hypotension on regular antihypertensive regime , medication adjustment to discontinue most of her medication as specified on her discharge instruction  and urgent Cardiology appt requested. ?Call in the interim with additional concerns ? ? ?

## 2021-06-03 NOTE — Telephone Encounter (Signed)
LMTRC

## 2021-06-03 NOTE — Telephone Encounter (Signed)
Here is the avs- did you want her to stop the 3 until further notice or just yesterday? ? ?Hold furosemide,quinapril, ? hold cardizem and only take half metoprolol this evening  ?  ?Take tomorrow isosorbide mononitrate 1 in the morning ?Half metoprolol once daily ( evening)  If BP or HR are increased then take half  metoprolol twice daily  ( HR over 100 or BP 145/90 or more)_ ?  ? ?

## 2021-06-03 NOTE — Assessment & Plan Note (Addendum)
Acute severe symptomatic  hypotension on regular antihypertensive regime , medication adjustment to discontinue most of her medication as specified on her discharge instruction and urgent Cardiology appt requested. ?Call in the interim with additional concerns ?

## 2021-06-04 ENCOUNTER — Encounter: Payer: Self-pay | Admitting: Cardiovascular Disease

## 2021-06-04 ENCOUNTER — Ambulatory Visit: Payer: Medicare HMO | Admitting: Cardiovascular Disease

## 2021-06-04 ENCOUNTER — Other Ambulatory Visit: Payer: Self-pay | Admitting: Cardiovascular Disease

## 2021-06-04 ENCOUNTER — Other Ambulatory Visit: Payer: Self-pay

## 2021-06-04 DIAGNOSIS — I10 Essential (primary) hypertension: Secondary | ICD-10-CM | POA: Diagnosis not present

## 2021-06-04 MED ORDER — METOPROLOL TARTRATE 25 MG PO TABS
ORAL_TABLET | ORAL | 3 refills | Status: DC
Start: 2021-06-04 — End: 2021-09-09

## 2021-06-04 NOTE — Patient Instructions (Signed)
Medication Instructions:  ? ?-Stop verapmil, lasix, quinapril, and potassium. ? ?-Metoprolol tartrate (lopressor) '25mg'$  in the morning. Take as needed in the evening for heart rate greater than 100bpm or blood pressure greater than 145/90. ? ?*If you need a refill on your cardiac medications before your next appointment, please call your pharmacy* ? ? ? ?Follow-Up: ?At [facility], you and your health needs are our priority.  As part of our continuing mission to provide you with exceptional heart care, we have created designated Provider Care Teams.  These Care Teams include your primary Cardiologist (physician) and Advanced Practice Providers (APPs -  Physician Assistants and Nurse Practitioners) who all work together to provide you with the care you need, when you need it. ? ?We recommend signing up for the patient portal called "MyChart".  Sign up information is provided on this After Visit Summary.  MyChart is used to connect with patients for Virtual Visits (Telemedicine).  Patients are able to view lab/test results, encounter notes, upcoming appointments, etc.  Non-urgent messages can be sent to your provider as well.   ?To learn more about what you can do with MyChart, go to [email or web address].   ? ?Your next appointment:   ?6 month(s) ? ?The format for your next appointment:   ?In Person ? ?Provider:   ?Coletta Memos, FNP, Fabian Sharp, PA-C, Sande Rives, PA-C, Caron Presume, PA-C, Jory Sims, DNP, ANP, or Almyra Deforest, PA-C     ? ? ?Then, Quay Burow, MD will plan to see you again in 12 month(s). ? ? ?Other Instructions ?Dr. Gwenlyn Found has requested that you schedule an appointment with one of our clinical pharmacists for a blood pressure check appointment within the next 4 weeks.  ?If you monitor your blood pressure (BP) at home, please bring your BP cuff and your BP readings with you to this appointment ? ?HOW TO TAKE YOUR BLOOD PRESSURE: ?Rest 5 minutes before taking your blood  pressure. ?Don?t smoke or drink caffeinated beverages for at least 30 minutes before. ?Take your blood pressure before (not after) you eat. ?Sit comfortably with your back supported and both feet on the floor (don?t cross your legs). ?Elevate your arm to heart level on a table or a desk. ?Use the proper sized cuff. It should fit smoothly and snugly around your bare upper arm. There should be enough room to slip a fingertip under the cuff. The bottom edge of the cuff should be 1 inch above the crease of the elbow. ?Ideally, take 3 measurements at one sitting and record the average. ? ? ?

## 2021-06-04 NOTE — Assessment & Plan Note (Signed)
History of essential hypertension previously on verapamil, quinapril, metoprolol and a diuretic.  She apparently had an episode of hypotension that she was symptomatic from and saw her PCP.  Her PCP discontinued the verapamil, quinapril, and Haft the metoprolol and discontinue the diuretic.  Her blood pressure today is 108/59.  We will have her keep a 30-day blood pressure log and see a Pharm.D. back in 4 weeks to further evaluate. ?

## 2021-06-04 NOTE — Progress Notes (Signed)
?1:52 PM ? ? ? ?06/04/2021 ?Jennifer Blackburn   ?01-01-1946  ?[id number] ? ?Primary Physician Fayrene Helper, MD ?Primary Cardiologist: Lorretta Harp MD [name], [place] ? ?HPI:  Jennifer Blackburn is a 76 y.o.  moderately overweight, married [place] American female, mother of 2, grandmother to 4 grandchildren who I last saw in the office  04/09/2020. She has a history of minimal CAD by cath back in June 2005 with normal LV function. Her other problems include treated hypertension, diabetes and dyslipidemia.  She does have a strong family history of heart disease with a father that died of an MI at age 33. Most recent lipid profile performed by Dr. Moshe Cipro on 11/14/14 revealing a total cholesterol 175, LDL of 112 and HDL of 42.. Dr. Moshe Cipro continues to follow her with profile . She has had several episodes of nocturnal chest pressure which has awakened her from sleep with a "heavy sensation on her chest and a feeling of diaphoresis since I last saw her but these are infrequent and have not changed frequency or severity. She did see Kerin Ransom in the office 02/17/17 complaining of chest pain and a Myoview stress test performed 02/23/17 was low risk.  ?  ?Since I saw her in the office 1 week ago she did have a symptomatic hypotensive episode.  She saw her PCP, Dr. Moshe Cipro, who discontinued her verapamil, quinapril, diuretic and down titrated her beta-blocker.  Her blood pressure today is 108/59.  We will continue to monitor her blood pressures at home. ? ?Current Meds  ?Medication Sig  ? acetaminophen (TYLENOL) 500 MG tablet Take 1,000 mg by mouth as needed (for pain.).  ? Cetirizine HCl 10 MG TBDP Take one tablet by mouth two times daily for allergy (Patient taking differently: Take 1 tablet by mouth daily.)  ? Cholecalciferol (VITAMIN D3) 50 MCG (2000 UT) TABS Take 2,000 Units by mouth daily.  ? gabapentin (NEURONTIN) 100 MG capsule TAKE 1 CAPSULE BY MOUTH 3 TIMES A DAY  ? glucose blood (ACCU-CHEK GUIDE)  test strip Use as instructed to check blood sugar 2 times per day dx code E11.65  ? hydrOXYzine (ATARAX) 10 MG tablet Take one tablet twice daily and two tablets at bedtime as needed for generalized itching.  ? imipramine (TOFRANIL) 50 MG tablet Take 1 tablet (50 mg total) by mouth at bedtime.  ? insulin aspart (NOVOLOG FLEXPEN) 100 UNIT/ML FlexPen Inject 10-15 Units into the skin 3 (three) times daily with meals. (Patient taking differently: Inject 15 Units into the skin 3 (three) times daily with meals.)  ? insulin glargine, 1 Unit Dial, (TOUJEO SOLOSTAR) 300 UNIT/ML Solostar Pen Inject 30-50 units two times daily (Patient taking differently: Inject 30 units every morning and 40 units every evening)  ? Insulin Pen Needle 32G X 4 MM MISC Use 4x a day  ? isosorbide mononitrate (IMDUR) 30 MG 24 hr tablet TAKE HALF A TABLET BY MOUTH EVERY DAY  ? Lancets (ACCU-CHEK MULTICLIX) lancets Use as instructed three times daily dx 250.01  ? levothyroxine (SYNTHROID) 137 MCG tablet TAKE 1 TABLET BY MOUTH DAILY BEFORE BREAKFAST.  ? metFORMIN (GLUCOPHAGE) 1000 MG tablet Take 1 tablet (1,000 mg total) by mouth 2 (two) times daily.  ? metoprolol tartrate (LOPRESSOR) 50 MG tablet Take 1 tablet (50 mg total) by mouth 2 (two) times daily.  ? pantoprazole (PROTONIX) 40 MG tablet Take 1 tablet (40 mg total) by mouth daily.  ? PARoxetine (PAXIL) 20 MG tablet TAKE 1  TABLET BY MOUTH EVERY DAY  ? pravastatin (PRAVACHOL) 80 MG tablet Take 1 tablet (80 mg total) by mouth daily.  ? Semaglutide, 1 MG/DOSE, (OZEMPIC, 1 MG/DOSE,) 4 MG/3ML SOPN Inject 1 mg into the skin once a week.  ? vitamin B-12 (CYANOCOBALAMIN) 1000 MCG tablet Take 1 tablet (1,000 mcg total) by mouth daily.  ? [DISCONTINUED] potassium chloride SA (KLOR-CON) 20 MEQ tablet Take 1 tablet (20 mEq total) by mouth daily.  ?  ? ?Allergies  ?Allergen Reactions  ? Lipitor [Atorvastatin Calcium] Other (See Comments)  ?  Muscle aches  ? Actos [Pioglitazone] Other (See Comments)  ?   Peripheral edema  ? Crestor [Rosuvastatin] Other (See Comments)  ?  Muscle aches  ? Daypro [Oxaprozin] Hives  ? Nsaids Hives  ? Sulfonamide Derivatives Hives  ? ? ?Social History  ? ?Socioeconomic History  ? Marital status: Married  ?  Spouse name: Not on file  ? Number of children: 2  ? Years of education: 93  ? Highest education level: Not on file  ?Occupational History  ? 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 Smoking status: Never  ?  Passive exposure: Yes  ? Smokeless tobacco: Never  ? Tobacco comments:  ?  Husband smokes in the home  ?Vaping Use  ? Vaping Use: Never used  ?Substance and Sexual Activity  ? Alcohol use: No  ? Drug use: No  ? Sexual activity: Never  ?Other Topics Concern  ? Not on file  ?Social History Narrative  ? Lives with husband, at home  ? No caffeine  ? ?Social Determinants of Health  ? ?Financial Resource Strain: Low Risk   ? Difficulty of Paying Living Expenses: Not hard at all  ?Food Insecurity: No Food Insecurity  ? Worried About Charity fundraiser in the Last Year: Never true  ? Ran Out of Food in the Last Year: Never true  ?Transportation Needs: No Transportation Needs  ? Lack of Transportation (Medical): No  ? Lack of Transportation (Non-Medical): No  ?Physical Activity: Insufficiently Active  ? Days of Exercise per Week: 3 days  ? Minutes of Exercise per Session: 20 min  ?Stress: No Stress Concern Present  ? Feeling of Stress : Not at all  ?Social Connections: Socially Integrated  ? Frequency of Communication with Friends and Family: More than three times a week  ? Frequency of Social Gatherings with Friends and Family: More than three times a week  ? Attends Religious Services: More than 4 times per year  ? Active Member of Clubs or Organizations: Yes  ? Attends Archivist Meetings: More than 4 times per year  ? Marital Status: Married  ?Intimate Partner Violence: Not At Risk  ? Fear of Current or Ex-Partner: No  ? Emotionally Abused: No  ? Physically  Abused: No  ? Sexually Abused: No  ?  ? ?Review of Systems: ?General: negative for chills, fever, night sweats or weight changes.  ?Cardiovascular: negative for chest pain, dyspnea on exertion, edema, orthopnea, palpitations, paroxysmal nocturnal dyspnea or shortness of breath ?Dermatological: negative for rash ?Respiratory: negative for cough or wheezing ?Urologic: negative for hematuria ?Abdominal: negative for nausea, vomiting, diarrhea, bright red blood per rectum, melena, or hematemesis ?Neurologic: negative for visual changes, syncope, or dizziness ?All other systems reviewed and are otherwise negative except as noted above. ? ? ? ?Blood pressure (!) 108/59, pulse 92, height '5\' 5"'$  (1.651 m), weight 180 lb 9.6 oz (81.9  kg), last menstrual period 11/11/2016, SpO2 97 %.  ?General appearance: alert and no distress ?Neck: no adenopathy, no carotid bruit, no JVD, supple, symmetrical, trachea midline, and thyroid not enlarged, symmetric, no tenderness/mass/nodules ?Lungs: clear to auscultation bilaterally ?Heart: regular rate and rhythm, S1, S2 normal, no murmur, click, rub or gallop ?Extremities: extremities normal, atraumatic, no cyanosis or edema ?Pulses: 2+ and symmetric ?Skin: Skin color, texture, turgor normal. No rashes or lesions ?Neurologic: Grossly normal ? ?EKG performed today ? ?ASSESSMENT AND PLAN:  ? ?Hypertension ?History of essential hypertension previously on verapamil, quinapril, metoprolol and a diuretic.  She apparently had an episode of hypotension that she was symptomatic from and saw her PCP.  Her PCP discontinued the verapamil, quinapril, and Haft the metoprolol and discontinue the diuretic.  Her blood pressure today is 108/59.  We will have her keep a 30-day blood pressure log and see a Pharm.D. back in 4 weeks to further evaluate. ? ? ? ? ?Lorretta Harp MD FACP,FACC,FAHA, FSCAI ?06/04/2021 ?1:52 PM ?

## 2021-06-11 ENCOUNTER — Encounter: Payer: Self-pay | Admitting: Internal Medicine

## 2021-06-18 ENCOUNTER — Other Ambulatory Visit: Payer: Self-pay | Admitting: Internal Medicine

## 2021-06-18 DIAGNOSIS — C73 Malignant neoplasm of thyroid gland: Secondary | ICD-10-CM

## 2021-06-19 ENCOUNTER — Telehealth: Payer: Self-pay | Admitting: *Deleted

## 2021-06-19 NOTE — Chronic Care Management (AMB) (Signed)
?  Care Management  ? ?Note ? ?06/19/2021 ?Name: Jennifer Blackburn MRN: [id number] DOB: Jul 22, 1945 ? ?Jennifer Blackburn is a 76 y.o. year old female who is a primary care patient of Fayrene Helper, MD and is actively engaged with the care management team. I reached out to Antoine Primas by phone today to assist with scheduling an initial visit with the RN Case Manager ? ?Follow up plan: ?Telephone appointment with care management team member scheduled for:07/23/21 ? ?Jennifer Blackburn  ?Care Guide, Embedded Care Coordination ?[facility]  Care Management  ?Direct Dial: [phone] ? ?

## 2021-06-19 NOTE — Chronic Care Management (AMB) (Signed)
?  Care Management  ? ?Note ? ?06/19/2021 ?Name: Jennifer Blackburn MRN: [id number] DOB: 1946/02/28 ? ?Jennifer Blackburn is a 76 y.o. year old female who is a primary care patient of Fayrene Helper, MD and is actively engaged with the care management team. I reached out to Antoine Primas by phone today to assist with scheduling an initial visit with the RN Case Manager ? ?Follow up plan: ?Unsuccessful telephone outreach attempt made. A HIPAA compliant phone message was left for the patient providing contact information and requesting a return call.  ?The care management team will reach out to the patient again over the next 7 days.  ?If patient returns call to provider office, please advise to call [place]  at [phone]. ? ?Laverda Sorenson  ?Care Guide, Embedded Care Coordination ?[place]  Care Management  ?Direct Dial: [phone] ? ?

## 2021-06-24 ENCOUNTER — Telehealth: Payer: Medicare HMO

## 2021-07-02 ENCOUNTER — Ambulatory Visit: Payer: Medicare HMO

## 2021-07-09 ENCOUNTER — Other Ambulatory Visit: Payer: Self-pay

## 2021-07-09 DIAGNOSIS — C73 Malignant neoplasm of thyroid gland: Secondary | ICD-10-CM

## 2021-07-09 MED ORDER — LEVOTHYROXINE SODIUM 137 MCG PO TABS: ORAL_TABLET | ORAL | 1 refills | Status: DC

## 2021-07-10 ENCOUNTER — Telehealth: Payer: Self-pay | Admitting: Cardiovascular Disease

## 2021-07-10 ENCOUNTER — Encounter: Payer: Self-pay | Admitting: Cardiovascular Disease

## 2021-07-10 NOTE — Telephone Encounter (Signed)
Patient reports BP 114/64 range and a consistent pulse rate of 118. Reviewed order for taking metoprolol tartrate (25 mg every morning and 25 mg in the evening for heart rate over 100 or BP over 145/90). Patient repeated this back in her own words. Patient will continue to monitor BP and P and call clinic on Monday to report readings. ?

## 2021-07-10 NOTE — Telephone Encounter (Signed)
STAT if HR is under 50 or over 120 ?(normal HR is 60-100 beats per minute) ? ?What is your heart rate? 118 ? ?Do you have a log of your heart rate readings (document readings)? Yes  ? ?Do you have any other symptoms? No   ?

## 2021-07-15 ENCOUNTER — Encounter: Payer: Self-pay | Admitting: Pharmacist Clinician (PhC)/ Clinical Pharmacy Specialist

## 2021-07-15 ENCOUNTER — Ambulatory Visit: Payer: Medicare HMO | Admitting: Pharmacist Clinician (PhC)/ Clinical Pharmacy Specialist

## 2021-07-15 DIAGNOSIS — I10 Essential (primary) hypertension: Secondary | ICD-10-CM

## 2021-07-15 NOTE — Progress Notes (Signed)
? ? ? ?07/15/2021 ?Antoine Primas ?12-Nov-1945 ?[id number] ? ? ?HPI:  Jennifer Blackburn is a 76 y.o. female patient of Dr Gwenlyn Found, with a [place] below who presents today for hypertension clinic evaluation.  She was seen by Dr. Gwenlyn Found in early March, then PCP requested urgent follow up after having an episode of symptomatic hypotension with unknown cause.  PCP stopped furosemide, quinapril and verapamil as well as cut metoprolol tartrate dose to 12.5 mg only in the evenings.  She could increase to 1/2 tablet twice daily for HR > 100 or BP > 145/90.   Dr Gwenlyn Found saw her a week later at which time her pressure was at 108/59.  He asked that she keep a log and return in a month for follow up.   ? ?Today she is here for follow up.  Has been checking home BP 2-4 times daily (see below) and has not had any further episodes of hypotension.  She explains that she got up one morning and stood up, but had to sit back down on the bed as she was weak an dizzy.   She recalls her diastolic pressure being < 50, but unsure what the systolic was.  Has not had any other spells since then.     ? ?Past Medical History: ?CAD Minimal by cath 2005  ?hyperlipidemia 1./23 LDL 70 on pravastatin 80  ?DM2 2/23 A1c 8.8 on metformin, novolog, Ozempic, Toujeo  ?GERD On pantoprazole  ?hypothyroid TSH on levothyroxine 137 mcg  ?  ? ?Blood Pressure Goal:  130/80 ? ?Current Medications: metoprolol tart 25 mg qd-bid depending on BP; isosorbide mono 15 mg qd ? ?(Previously on quinapril 10 mg, furosemide 20 mg, verapamil 180 mg, potassium) ? ?Family Hx: mother had CHF, died at 25, father from MI at 14, 2 children, no BP issues at this point ? ?Social Hx: no tobacco (does get secondhand); no alcohol, coffee or tea daily - was up to 3 cups per day but cut back to 2 2/2 acid reflux - avoids chocolate, caffeine, mint ? ?Diet: more home cooked meals, no added salt; eats more vegetables/fruits, not a lot meats, uses beans for protein  ? ?Exercise: limited by back issues  - put off back surgery ? ?Home BP readings:   ? 33 AM readings average 127/78 (range 106-148/52-99) ? 33 PM readings average 131/79 (range ? ?Intolerances: statins, NSAID's, sulfa antibiotics, Actos ? ?Labs: 1/23:  Na 141, K 4.8, Glu 175, BUN 14, SCr 0.82, GFR 74 ? ? ?Wt Readings from Last 3 Encounters:  ?07/15/21 180 lb 9.6 oz (81.9 kg)  ?06/04/21 180 lb 9.6 oz (81.9 kg)  ?06/02/21 178 lb 12.8 oz (81.1 kg)  ? ?BP Readings from Last 3 Encounters:  ?07/15/21 (!) 144/72  ?06/04/21 (!) 108/59  ?06/02/21 104/78  ? ?Pulse Readings from Last 3 Encounters:  ?07/15/21 83  ?06/04/21 92  ?05/27/21 70  ? ? ?Current Outpatient Medications  ?Medication Sig Dispense Refill  ? acetaminophen (TYLENOL) 500 MG tablet Take 1,000 mg by mouth as needed (for pain.).    ? Cetirizine HCl 10 MG TBDP Take one tablet by mouth two times daily for allergy (Patient taking differently: Take 1 tablet by mouth daily.) 60 tablet 5  ? Cholecalciferol (VITAMIN D3) 50 MCG (2000 UT) TABS Take 2,000 Units by mouth daily.    ? gabapentin (NEURONTIN) 100 MG capsule TAKE 1 CAPSULE BY MOUTH 3 TIMES A DAY 270 capsule 1  ? glucose blood (ACCU-CHEK GUIDE) test strip  Use as instructed to check blood sugar 2 times per day dx code E11.65 100 each 3  ? hydrOXYzine (ATARAX) 10 MG tablet Take one tablet twice daily and two tablets at bedtime as needed for generalized itching. 360 tablet 1  ? imipramine (TOFRANIL) 50 MG tablet Take 1 tablet (50 mg total) by mouth at bedtime. 90 tablet 2  ? insulin aspart (NOVOLOG FLEXPEN) 100 UNIT/ML FlexPen Inject 10-15 Units into the skin 3 (three) times daily with meals. (Patient taking differently: Inject 15 Units into the skin 3 (three) times daily with meals.) 15 mL 2  ? insulin glargine, 1 Unit Dial, (TOUJEO SOLOSTAR) 300 UNIT/ML Solostar Pen Inject 30-50 units two times daily (Patient taking differently: Inject 30 units every morning and 40 units every evening) 10.5 mL 2  ? Insulin Pen Needle 32G X 4 MM MISC Use 4x a day 300  each 3  ? isosorbide mononitrate (IMDUR) 30 MG 24 hr tablet TAKE HALF A TABLET BY MOUTH EVERY DAY 45 tablet 3  ? Lancets (ACCU-CHEK MULTICLIX) lancets Use as instructed three times daily dx 250.01 100 each 5  ? levothyroxine (SYNTHROID) 137 MCG tablet TAKE 1 TABLET BY MOUTH EVERY DAY BEFORE BREAKFAST 90 tablet 1  ? metFORMIN (GLUCOPHAGE) 1000 MG tablet Take 1 tablet (1,000 mg total) by mouth 2 (two) times daily. 180 tablet 1  ? metoprolol tartrate (LOPRESSOR) 25 MG tablet Take 1 tablet (25 mg total) by mouth every morning. May also take 1 tablet (25 mg total) as needed (as needed in the evening for heart rate greater than 100bpm or blood pressure over 145/90). 135 tablet 3  ? pantoprazole (PROTONIX) 40 MG tablet Take 1 tablet (40 mg total) by mouth daily. 30 tablet 11  ? PARoxetine (PAXIL) 20 MG tablet TAKE 1 TABLET BY MOUTH EVERY DAY 90 tablet 1  ? pravastatin (PRAVACHOL) 80 MG tablet Take 1 tablet (80 mg total) by mouth daily. 90 tablet 1  ? Semaglutide, 1 MG/DOSE, (OZEMPIC, 1 MG/DOSE,) 4 MG/3ML SOPN Inject 1 mg into the skin once a week. 9 mL 3  ? vitamin B-12 (CYANOCOBALAMIN) 1000 MCG tablet Take 1 tablet (1,000 mcg total) by mouth daily. 30 tablet 5  ? ?No current facility-administered medications for this visit.  ? ? ?Allergies  ?Allergen Reactions  ? Lipitor [Atorvastatin Calcium] Other (See Comments)  ?  Muscle aches  ? Actos [Pioglitazone] Other (See Comments)  ?  Peripheral edema  ? Crestor [Rosuvastatin] Other (See Comments)  ?  Muscle aches  ? Daypro [Oxaprozin] Hives  ? Nsaids Hives  ? Sulfonamide Derivatives Hives  ? ? ?Past Medical History:  ?Diagnosis Date  ? Anxiety   ? Arthritis   ? CAD (coronary artery disease)   ? Depression   ? Diabetes mellitus type II   ? without complication  ? DJD (degenerative joint disease) of lumbar spine   ? Hypercholesteremia   ? Hyperlipidemia   ? Hypertension   ? benign   ? Hypothyroidism   ? Thyroid cancer ([place]) 2001  ? ? ?Blood pressure (!) 144/72, pulse 83, resp.  rate 15, height '5\' 5"'$  (1.651 m), weight 180 lb 9.6 oz (81.9 kg), last menstrual period 11/11/2016, SpO2 99 %. ? ?BP standing 122/62, standing at 1 minute 128/74 ? ?Hypertension ?Patient with essential hypertension, recently had an episode of orthostatic hypotension.  Unsure as to the cause.  BP readings have started to trend upward, but are still averaging at a good range.  Will have her  take the metoprolol 25 mg twice daily consistently and continue to stay off quinapril, furosemide/potassium and verapamil.  Asked that she continue with twice daily BP monitoring for the next 2 months then return for follow up.  At that time we can determine if any other BP medications need to be reinstated.   ? ? ?Tommy Medal PharmD CPP [facility] ?[address] ?[place], [place] [address] ?[phone] ?

## 2021-07-15 NOTE — Assessment & Plan Note (Signed)
Patient with essential hypertension, recently had an episode of orthostatic hypotension.  Unsure as to the cause.  BP readings have started to trend upward, but are still averaging at a good range.  Will have her take the metoprolol 25 mg twice daily consistently and continue to stay off quinapril, furosemide/potassium and verapamil.  Asked that she continue with twice daily BP monitoring for the next 2 months then return for follow up.  At that time we can determine if any other BP medications need to be reinstated.   ?

## 2021-07-15 NOTE — Patient Instructions (Signed)
Return for a a follow up appointment Wednesday June 21 at 1:30 pm ? ?Check your blood pressure at home twice daily, 3-4 days per week and keep record of the readings. ? ?Take your BP meds as follows: ? Restart the metoprolol 25 mg twice daily ? ?Bring all of your meds, your BP cuff and your record of home blood pressures to your next appointment.  Exercise as you?re able, try to walk approximately 30 minutes per day.  Keep salt intake to a minimum, especially watch canned and prepared boxed foods.  Eat more fresh fruits and vegetables and fewer canned items.  Avoid eating in fast food restaurants.  ? ? HOW TO TAKE YOUR BLOOD PRESSURE: ?Rest 5 minutes before taking your blood pressure. ? Don?t smoke or drink caffeinated beverages for at least 30 minutes before. ?Take your blood pressure before (not after) you eat. ?Sit comfortably with your back supported and both feet on the floor (don?t cross your legs). ?Elevate your arm to heart level on a table or a desk. ?Use the proper sized cuff. It should fit smoothly and snugly around your bare upper arm. There should be enough room to slip a fingertip under the cuff. The bottom edge of the cuff should be 1 inch above the crease of the elbow. ?Ideally, take 3 measurements at one sitting and record the average. ? ? ?

## 2021-07-23 ENCOUNTER — Telehealth: Payer: Medicare HMO

## 2021-07-23 ENCOUNTER — Telehealth: Payer: Self-pay | Admitting: *Deleted

## 2021-07-23 NOTE — Telephone Encounter (Signed)
?  Care Management  ? ?Follow Up Note ? ? ?07/23/2021 ?Name: Jennifer Blackburn MRN: [id number] DOB: [birth year] ? ? ?Referred by: Fayrene Helper, MD ?Reason for referral : Chronic Care Management (DM2, HTN) ? ? ?An unsuccessful telephone outreach was attempted today. The patient was referred to the case management team for assistance with care management and care coordination.  ? ?Follow Up Plan: Telephone follow up appointment with care management team member scheduled for: upon care guide rescheduling. ? ?Jacqlyn Larsen RNC, BSN ?RN Case Manager ?[place] ?[phone] ? ?

## 2021-08-12 ENCOUNTER — Other Ambulatory Visit: Payer: Self-pay | Admitting: Family Medicine

## 2021-08-24 ENCOUNTER — Other Ambulatory Visit: Payer: Self-pay | Admitting: Family Medicine

## 2021-08-27 ENCOUNTER — Ambulatory Visit: Payer: Medicare HMO | Admitting: Internal Medicine

## 2021-08-27 ENCOUNTER — Encounter: Payer: Self-pay | Admitting: Internal Medicine

## 2021-08-27 VITALS — BP 124/76 | HR 74 | Ht 65.0 in | Wt 179.8 lb

## 2021-08-27 DIAGNOSIS — E890 Postprocedural hypothyroidism: Secondary | ICD-10-CM | POA: Diagnosis not present

## 2021-08-27 DIAGNOSIS — E1159 Type 2 diabetes mellitus with other circulatory complications: Secondary | ICD-10-CM | POA: Diagnosis not present

## 2021-08-27 DIAGNOSIS — C73 Malignant neoplasm of thyroid gland: Secondary | ICD-10-CM

## 2021-08-27 LAB — POCT GLYCOSYLATED HEMOGLOBIN (HGB A1C): Hemoglobin A1C: 8.8 % — AB (ref 4.0–5.6)

## 2021-08-27 LAB — T4, FREE: Free T4: 1.32 ng/dL (ref 0.60–1.60)

## 2021-08-27 LAB — TSH: TSH: 0.06 u[IU]/mL — ABNORMAL LOW (ref 0.35–5.50)

## 2021-08-27 NOTE — Patient Instructions (Addendum)
Please continue: - Metformin 1000 mg 2x a day with meals. - Toujeo 30 units in a.m. and 40 units at bedtime - Novolog 10-15 units before each meal - take the insulin pen with you when eating out  Try to increase: - Ozempic 2 mg weekly  Please continue levothyroxine 137 mcg daily.  Take the thyroid hormone every day, with water, at least 30 minutes before breakfast, separated by at least 4 hours from: - acid reflux medications - calcium - iron - multivitamins  Please schedule an appt with Antonieta Iba with nutrition.  Please return in 3-4 months with your sugar log.

## 2021-08-27 NOTE — Progress Notes (Signed)
Patient ID: Jennifer Blackburn, female   DOB: 1946-03-11, 76 y.o.   MRN: [id number]   HPI: Jennifer Blackburn is a 76 y.o.-year-old female, returning for follow-up for DM2, dx in 2001, insulin-dependent since 2017, uncontrolled, with long-term complications (DR, CAD) and also papillary thyroid cancer and postsurgical hypothyroidism.  Last visit 4 months ago.  Interim history: No increased urination, blurry vision, nausea, chest pain. She has constipation - on prune juice, MiraLAX, Linzess.   DM2: Reviewed HbA1c levels: Lab Results  Component Value Date   HGBA1C 8.8 (A) 04/28/2021   HGBA1C 10.1 (A) 12/23/2020   HGBA1C 9.0 (A) 08/07/2020   HGBA1C 10.5 (A) 05/09/2020   HGBA1C 9.7 (A) 01/04/2020   HGBA1C 8.4 (A) 08/29/2019   HGBA1C 9.3 (A) 05/03/2019   HGBA1C 8.8 (A) 12/28/2018   HGBA1C 8.9 (A) 09/21/2018   HGBA1C 8.4 (A) 05/26/2018  01/12/2018: HbA1c 8.7% 08/05/2017: HbA1c 8.9% 07/11/2017: HbA1c 9.1%  Pt is on a regimen of: - Metformin 1000 mg 2x a day with meals. - Toujeo 30 units in a.m. and 50 units at bedtime >> 30 units in a.m. and 40 units at bedtime - Lispro 8-10 >> 10 mg 3x a day 15 min before dinner >> NovoLog 10 -15 units before each meal - Ozempic 0.5 >> off >> 1  mg weekly (through the patient assistance program)  She was on Ozempic 0.5 mg weekly in a.m. >> 1 mg  - constipation >> stopped 06/2018.  We added it back 09/2018. She was on Victoza >> $$$. She was on Jardiance >> $$$. She was on Actos >> swelling - stopped Spring 2019. She was on Glipizide XL 5 mg daily >> stopped 07/2017.  We retried glipizide 5 mg twice a day but had to stop 12/2018 due to lack of effect.  Pt checks her sugars 1-2 times a day: - am:  99, 130-180 >> 180-200 >> 120-130 >> 96, 110-160 - 2h after b'fast: n/c - before lunch: n/c - 2h after lunch: 265-300s >> n/c >> 200s >> 200s >> n/c - before dinner: n/c >> 185-190 >> n/c - 2h after dinner: n/c - bedtime:  160-180 >> 170-180s >> 160s >>  140-210 - nighttime: n/c Lowest sugar was 170 >> 86 >>90s; she has hypoglycemia awareness in the 60s. Highest sugar was 200 >> 180s >> 200s.  Glucometer: AccuChek Aviva  Pt's meals are: - Breakfast:toast, cereal, milk; oatmeal; bacon + egg >> oatmeal + coffee >> cereals - Lunch: skips >> 2 eggs, fruit - Dinner: hamburger, or chicken, or fish, + veggies, bread, water and tea - Snacks: 2- fruit She was seeing a dietitian in [place]. She walks for exercise.  -No CKD, last BUN/creatinine:  Lab Results  Component Value Date   BUN 14 04/21/2021   BUN 15 01/28/2021   CREATININE 0.82 04/21/2021   CREATININE 0.92 01/28/2021  On quinapril.  -+ HL; last set of lipids: Lab Results  Component Value Date   CHOL 126 04/21/2021   HDL 37 (L) 04/21/2021   LDLCALC 70 04/21/2021   TRIG 103 04/21/2021   CHOLHDL 3.4 04/21/2021  On pravastatin 80.  - last eye exam was in 10/2020: No DR . Dr. Eulas Post in [place].  She has cataracts OU.   -No numbness and tingling in her feet. Has a podiatrist.  Last foot exam 01/21/2021.  On ASA 81.  Pt has no FH of DM.  H/o Thyroid cancer (2001), s/p RAI treatment, now with postsurgical hypothyroidism.  Previously on  brand-name Synthroid, now generic levothyroxine.  Pt is on levothyroxine 137 mcg daily, taken: - in am - fasting - at least 30 min from b'fast - no calcium - no iron - no multivitamins - + PPIs (Protonix) moved during the day - not on Biotin  Reviewed her TFTs: Lab Results  Component Value Date   TSH 0.40 08/07/2020   TSH 1.60 05/09/2020   TSH 0.05 (L) 08/31/2019   TSH 0.04 (L) 12/28/2018   TSH 0.06 (L) 09/21/2018   TSH 0.61 05/26/2018   TSH 2.49 03/23/2016   TSH 0.070 (L) 10/13/2010   TSH 0.035 (L) 09/22/2006   TSH 0.015 (L) 06/17/2006  11/03/2017: TSH 0.044  Her thyroglobulin levels are detectable: Lab Results  Component Value Date   THYROGLB 0.4 (L) 08/07/2020   THYROGLB 1.2 (L) 05/09/2020   THYROGLB 0.4 (L)  08/31/2019   THYROGLB 0.3 (L) 09/21/2018   THYROGLB 0.7 (L) 05/26/2018   Lab Results  Component Value Date   THGAB <1 08/07/2020   THGAB <1 05/09/2020   THGAB <1 08/31/2019   THGAB <1 09/21/2018   THGAB <1 05/26/2018   Reviewed records from Dr. Ronnald Collum: Patient is status post total thyroidectomy in 2004 1.3 cm papillary thyroid cancer of the right lobe, followed by 3x RAI tx's with  33 mCi I-131 and a fourth RAI dose inpatient: 125 mCi, at [facility].    Whole-body scan on 09/08/2007 showed an increase in the thyroid bed activity and the follow up study with PET scan 10/08/2007 showed mildly hypermetabolic cervical lymph nodes.    Repeat PET 05/10/2008 showed no significant changes felt likely to be due to to a reactive process.  11/03/2017: TSH 0.044, thyroglobulin 0.2, ATA <1.0  04/05/2017: TSH 0.021 (0.45-4.5), thyroglobulin 0.3 (by IMA), ATA <1.0  Reviewed imaging test reports: Thyroid ultrasound (06/10/2018): 1.1 cm nonspecific calcified lesion in the right upper neck.  CT was recommended: There is no residual or recurrent tissue in the right or left thyroid beds. There is no evidence of abnormal adenopathy by short axis diameter measurement criteria There is a nonspecific calcified soft tissue mass measuring 1.1 cm in the right superior neck. This is of unknown significance. CT neck can be performed to further characterize.  CT (10/18/2018): Status post thyroidectomy. No soft tissue mass within the thyroidectomy bed. A 13 x 7 mm ovoid focus of calcification in the right aspect of the thyroidectomy bed likely corresponding with the finding on recent neck ultrasound and is unchanged as compared to neck CT 10/24/2007, benign.   No pathologically enlarged cervical chain lymph nodes. A nonenlarged calcified right supraclavicular lymph node is new from prior neck CT 10/24/2007 but also favored benign. Attention recommended on follow-up.  Neck U/S (06/03/2020): Ultrasound of the neck  demonstrates no evidence of visible residual thyroid tissue or abnormal soft tissue mass.   Ovoid area of densely shadowing calcification again noted to the right of midline as seen by prior ultrasound and also demonstrated by prior CT studies of the neck with documented stability between 2009 and 2020 studies.   Small left cervical lymph node has a similar appearance to the prior ultrasound study measuring 0.5 cm in short axis.   IMPRESSION: No evidence by ultrasound of recurrent thyroid malignancy in the neck.  Stable right neck calcification which has been documented to be benign and stable by prior CT.  Small left cervical lymph nodedemonstrates stable appearance and size by ultrasound  compared to the 2020 ultrasound.  Pt denies: -  feeling nodules in neck - hoarseness - dysphagia - choking  She also has a history of HTN and anemia.  Her daughter lives in [place] and she is sick.  Patient travels between here in [place] frequently.   ROS: + See HPI Musculoskeletal: + muscle aches/+ joint aches  I reviewed pt's medications, allergies, PMH, social hx, family hx, and changes were documented in the history of present illness. Otherwise, unchanged from my initial visit note.  Past Medical History:  Diagnosis Date   Anxiety    Arthritis    CAD (coronary artery disease)    Depression    Diabetes mellitus type II    without complication   DJD (degenerative joint disease) of lumbar spine    Hypercholesteremia    Hyperlipidemia    Hypertension    benign    Hypothyroidism    Thyroid cancer ([place]) 2001   Past Surgical History:  Procedure Laterality Date   ABDOMINAL HYSTERECTOMY     CARDIAC CATHETERIZATION     CATARACT EXTRACTION W/PHACO Right 07/17/2012   Procedure: CATARACT EXTRACTION PHACO AND INTRAOCULAR LENS PLACEMENT ([place]);  Surgeon: Tonny Branch, MD;  Location: [facility];  Service: Ophthalmology;  Laterality: Right;  CDE:25.51   CATARACT EXTRACTION W/PHACO  Left 09/17/2016   Procedure: CATARACT EXTRACTION PHACO AND INTRAOCULAR LENS PLACEMENT LEFT EYE;  Surgeon: Tonny Branch, MD;  Location: [facility];  Service: Ophthalmology;  Laterality: Left;  CDE: 19.23   COLONOSCOPY     COLONOSCOPY N/A 01/15/2014   Procedure: COLONOSCOPY;  Surgeon: Daneil Dolin, MD;  Location: [facility] SUITE;  Service: Endoscopy;  Laterality: N/A;  9:00 AM   COLONOSCOPY WITH PROPOFOL N/A 01/25/2019   Procedure: COLONOSCOPY WITH PROPOFOL;  Surgeon: Daneil Dolin, MD; diverticulosis in the entire colon, otherwise normal exam.  No repeat colonoscopy due to age.   DOPPLER ECHOCARDIOGRAPHY     ESOPHAGOGASTRODUODENOSCOPY (EGD) WITH PROPOFOL N/A 01/25/2019   Procedure: ESOPHAGOGASTRODUODENOSCOPY (EGD) WITH PROPOFOL;  Surgeon: Daneil Dolin, MD; normal esophagus without dilation due to inability to pass dilator beyond the hypopharynx, 1 small nonbleeding gastric ulcer s/p biopsied, otherwise normal exam.  Pathology with ulcer with reactive changes, no H. pylori, metaplasia, dysplasia, or malignancy.   ESOPHAGOGASTRODUODENOSCOPY (EGD) WITH PROPOFOL N/A 08/21/2019   Procedure: ESOPHAGOGASTRODUODENOSCOPY (EGD) WITH PROPOFOL;  Surgeon: Daneil Dolin, MD; Normal esophagus. Previously noted gastric ulcer completely healed. Normal examined duodenum.    JOINT REPLACEMENT  07/01/2010   left hip   KNEE SURGERY Right    arthroscopy   left hip replaced  07/01/2010   SPINE SURGERY  2006   cervical   stress dipyridamole myocardial perfusion     THYROIDECTOMY     TONSILLECTOMY     VESICOVAGINAL FISTULA CLOSURE W/ TAH     Social History   Socioeconomic History   Marital status: Married    Spouse name: Not on file   Number of children: 2   Years of education: 14   Highest education level: Not on file  Occupational History   Occupation: retired     Comment: bank  Tobacco Use   Smoking status: Never    Passive exposure: Yes   Smokeless tobacco: Never   Tobacco comments:    Husband smokes in  the home  Vaping Use   Vaping Use: Never used  Substance and Sexual Activity   Alcohol use: No   Drug use: No   Sexual activity: Never  Other Topics Concern   Not on file  Social  History Narrative   Lives with husband, at home   No caffeine   Social Determinants of Health   Financial Resource Strain: Not on file  Food Insecurity: Not on file  Transportation Needs: Not on file  Physical Activity: Insufficiently Active (08/06/2020)   Exercise Vital Sign    Days of Exercise per Week: 3 days    Minutes of Exercise per Session: 20 min  Stress: Not on file  Social Connections: Not on file  Intimate Partner Violence: Not on file   Current Outpatient Medications  Medication Sig Dispense Refill   acetaminophen (TYLENOL) 500 MG tablet Take 1,000 mg by mouth as needed (for pain.).     Cetirizine HCl 10 MG TBDP Take one tablet by mouth two times daily for allergy (Patient taking differently: Take 1 tablet by mouth daily.) 60 tablet 5   Cholecalciferol (VITAMIN D3) 50 MCG (2000 UT) TABS Take 2,000 Units by mouth daily.     gabapentin (NEURONTIN) 100 MG capsule TAKE 1 CAPSULE BY MOUTH 3 TIMES A DAY 270 capsule 1   glucose blood (ACCU-CHEK GUIDE) test strip Use as instructed to check blood sugar 2 times per day dx code E11.65 100 each 3   hydrOXYzine (ATARAX) 10 MG tablet Take one tablet twice daily and two tablets at bedtime as needed for generalized itching. 360 tablet 1   imipramine (TOFRANIL) 50 MG tablet Take 1 tablet (50 mg total) by mouth at bedtime. 90 tablet 2   insulin aspart (NOVOLOG FLEXPEN) 100 UNIT/ML FlexPen Inject 10-15 Units into the skin 3 (three) times daily with meals. (Patient taking differently: Inject 15 Units into the skin 3 (three) times daily with meals.) 15 mL 2   insulin glargine, 1 Unit Dial, (TOUJEO SOLOSTAR) 300 UNIT/ML Solostar Pen Inject 30-50 units two times daily (Patient taking differently: Inject 30 units every morning and 40 units every evening) 10.5 mL 2    Insulin Pen Needle 32G X 4 MM MISC Use 4x a day 300 each 3   isosorbide mononitrate (IMDUR) 30 MG 24 hr tablet TAKE HALF A TABLET BY MOUTH EVERY DAY 45 tablet 3   Lancets (ACCU-CHEK MULTICLIX) lancets Use as instructed three times daily dx 250.01 100 each 5   levothyroxine (SYNTHROID) 137 MCG tablet TAKE 1 TABLET BY MOUTH EVERY DAY BEFORE BREAKFAST 90 tablet 1   metFORMIN (GLUCOPHAGE) 1000 MG tablet Take 1 tablet (1,000 mg total) by mouth 2 (two) times daily. 180 tablet 1   metoprolol tartrate (LOPRESSOR) 25 MG tablet Take 1 tablet (25 mg total) by mouth every morning. May also take 1 tablet (25 mg total) as needed (as needed in the evening for heart rate greater than 100bpm or blood pressure over 145/90). 135 tablet 3   pantoprazole (PROTONIX) 40 MG tablet Take 1 tablet (40 mg total) by mouth daily. 30 tablet 11   PARoxetine (PAXIL) 20 MG tablet TAKE 1 TABLET EVERY DAY 90 tablet 1   pravastatin (PRAVACHOL) 80 MG tablet TAKE 1 TABLET EVERY DAY 90 tablet 1   Semaglutide, 1 MG/DOSE, (OZEMPIC, 1 MG/DOSE,) 4 MG/3ML SOPN Inject 1 mg into the skin once a week. 9 mL 3   vitamin B-12 (CYANOCOBALAMIN) 1000 MCG tablet Take 1 tablet (1,000 mcg total) by mouth daily. 30 tablet 5   No current facility-administered medications for this visit.     Allergies  Allergen Reactions   Lipitor [Atorvastatin Calcium] Other (See Comments)    Muscle aches   Actos [Pioglitazone] Other (See Comments)  Peripheral edema   Crestor [Rosuvastatin] Other (See Comments)    Muscle aches   Daypro [Oxaprozin] Hives   Nsaids Hives   Sulfonamide Derivatives Hives   Family History  Problem Relation Age of Onset   Heart attack Father    Heart failure Mother    Asthma Daughter    Sleep apnea Son        CPAP   Colon cancer Neg Hx    PE: BP 124/76 (BP Location: Right Arm, Patient Position: Sitting, Cuff Size: Normal)   Pulse 74   Ht '5\' 5"'$  (1.651 m)   Wt 179 lb 12.8 oz (81.6 kg)   LMP 11/11/2016   SpO2 94%   BMI  29.92 kg/m  Wt Readings from Last 3 Encounters:  08/27/21 179 lb 12.8 oz (81.6 kg)  07/15/21 180 lb 9.6 oz (81.9 kg)  06/04/21 180 lb 9.6 oz (81.9 kg)   Constitutional: overweight, in NAD Eyes: EOMI, no exophthalmos ENT: moist mucous membranes, no neck masses palpated, no cervical lymphadenopathy Cardiovascular: RRR, No MRG Respiratory: CTA B Musculoskeletal: no deformities Skin: moist, warm, no rashes Neurological: no tremor with outstretched hands  ASSESSMENT: 1. DM2, insulin-dependent, uncontrolled, with long-term complications - CAD - DR  2.  Papillary thyroid cancer  3.  Postsurgical hypothyroidism  PLAN:  1. Patient with longstanding, uncontrolled, type 2 diabetes, on metformin, basal/bolus insulin regimen and weekly GLP-1 receptor agonist, with improved control at last visit.  The majority of her blood sugars were at goal at that time, without low CBGs.  She was only able to start Ozempic 2 months prior to the appointment.  She initially had GI symptoms but this resolved.  She is also doing a better job remembering to take this.  We did not change her regimen at that time.  HbA1c was better, but still above target. -At today's visit, sugars are at or above target in the morning and also at bedtime.  She is not taking an the day.  She tells me she has many social events and is very busy.  She is eating meals out and not always taking her NovoLog with her.  We discussed about the potential use of an insulin patch pump, but she tells me that she tried this last year and was not approved by her insurance.  At today's visit, we discussed about increasing the dose of Ozempic.  She does have consultation for which she uses MiraLAX and prune juice.  Discussed about the fact that prune juice can increase her blood sugars.  We will try to increase the Ozempic to 2 mg weekly and I also advised her to always take NovoLog with her when eating out.  Otherwise, we can continue the same doses of  metformin and Toujeo.  - I suggested to:  Patient Instructions  Please continue: - Metformin 1000 mg 2x a day with meals. - Toujeo 30 units in a.m. and 40 units at bedtime - Novolog 10-15 units before each meal - take the insulin pen with you when eating out  Try to increase: - Ozempic 2 mg weekly  Please continue levothyroxine 137 mcg daily.  Take the thyroid hormone every day, with water, at least 30 minutes before breakfast, separated by at least 4 hours from: - acid reflux medications - calcium - iron - multivitamins  Please schedule an appt with Antonieta Iba with nutrition.  Please return in 3-4 months with your sugar log.  - we checked her HbA1c: 8.8% (stable) - advised  to check sugars at different times of the day - 4x a day, rotating check times - advised for yearly eye exams >> she is UTD - return to clinic in 3-4 months   2.  Papillary thyroid cancer -Previous thyroid cancer records were reviewed from Dr. Ronnald Collum -She has metastatic thyroid cancer and had 4 RAI treatments.  She also had increased signal on the PET scan from 2009, however, in 2010, another PET scan showed possible inflammatory lymph nodes in the area only -Her thyroglobulin levels were detectable but stable in the past.  She had a high level in 04/2020, but we repeated the levels and they returned to baseline -On the latest neck ultrasound from 05/2020, there were no new concerning masses.  Previous neck ultrasound also showed a specific calcified region in the right upper neck and a CT scan of the neck in 2019 showed that the calcified mass was stable, most likely benign. -we will recheck Tg + ATA now  3.  Postsurgical hypothyroidism - latest thyroid labs reviewed with pt. >> normal: Lab Results  Component Value Date   TSH 0.40 08/07/2020  - she continues on LT4 137 mcg daily - pt feels good on this dose. - we discussed about taking the thyroid hormone every day, with water, >30 minutes before  breakfast, separated by >4 hours from acid reflux medications, calcium, iron, multivitamins. Pt. is taking it correctly. - will check thyroid tests today: TSH and fT4 - If labs are abnormal, she will need to return for repeat TFTs in 1.5 months  Component     Latest Ref Rng 08/27/2021  TSH     0.35 - 5.50 uIU/mL 0.06 (L)   T4,Free(Direct)     0.60 - 1.60 ng/dL 1.32    Tg + ATA are still pending, however, her TSH is too suppressed.  I will advise her to back off the levothyroxine dose to 125 mcg daily and recheck the test in 1.5 months.  Philemon Kingdom, MD PhD [facility] Endocrinology

## 2021-08-28 LAB — THYROGLOBULIN LEVEL: Thyroglobulin: 0.3 ng/mL — ABNORMAL LOW

## 2021-08-28 LAB — THYROGLOBULIN ANTIBODY: Thyroglobulin Ab: <1 IU/mL (ref <=1)

## 2021-08-28 MED ORDER — LEVOTHYROXINE SODIUM 125 MCG PO TABS: ORAL_TABLET | ORAL | 3 refills | Status: DC

## 2021-08-31 ENCOUNTER — Ambulatory Visit (INDEPENDENT_AMBULATORY_CARE_PROVIDER_SITE_OTHER): Payer: Medicare HMO

## 2021-08-31 DIAGNOSIS — Z0000 Encounter for general adult medical examination without abnormal findings: Secondary | ICD-10-CM | POA: Diagnosis not present

## 2021-08-31 NOTE — Patient Instructions (Signed)
[name] , Thank you for taking time to come for your Medicare Wellness Visit. I appreciate your ongoing commitment to your health goals. Please review the following plan we discussed and let me know if I can assist you in the future.   Screening recommendations/referrals: Colonoscopy: No longer due  Mammogram: No longer due   Bone Density: Complete Recommended yearly ophthalmology/optometry visit for glaucoma screening and checkup Recommended yearly dental visit for hygiene and checkup  Vaccinations: Influenza vaccine: Complete Pneumococcal vaccine: Complete Tdap vaccine: Due now Shingles vaccine: Due now    Advanced directives: Patient declined  Conditions/risks identified: Hypertension,diabetic    Next appointment: 1 year.   Preventive Care 56 Years and Older, Female Preventive care refers to lifestyle choices and visits with your health care provider that can promote health and wellness. What does preventive care include? A yearly physical exam. This is also called an annual well check. Dental exams once or twice a year. Routine eye exams. Ask your health care provider how often you should have your eyes checked. Personal lifestyle choices, including: Daily care of your teeth and gums. Regular physical activity. Eating a healthy diet. Avoiding tobacco and drug use. Limiting alcohol use. Practicing safe sex. Taking low-dose aspirin every day. Taking vitamin and mineral supplements as recommended by your health care provider. What happens during an annual well check? The services and screenings done by your health care provider during your annual well check will depend on your age, overall health, lifestyle risk factors, and family history of disease. Counseling  Your health care provider may ask you questions about your: Alcohol use. Tobacco use. Drug use. Emotional well-being. Home and relationship well-being. Sexual activity. Eating habits. History of  falls. Memory and ability to understand (cognition). Work and work Statistician. Reproductive health. Screening  You may have the following tests or measurements: Height, weight, and BMI. Blood pressure. Lipid and cholesterol levels. These may be checked every 5 years, or more frequently if you are over 13 years old. Skin check. Lung cancer screening. You may have this screening every year starting at age 61 if you have a 30-pack-year history of smoking and currently smoke or have quit within the past 15 years. Fecal occult blood test (FOBT) of the stool. You may have this test every year starting at age 75. Flexible sigmoidoscopy or colonoscopy. You may have a sigmoidoscopy every 5 years or a colonoscopy every 10 years starting at age 42. Hepatitis C blood test. Hepatitis B blood test. Sexually transmitted disease (STD) testing. Diabetes screening. This is done by checking your blood sugar (glucose) after you have not eaten for a while (fasting). You may have this done every 1-3 years. Bone density scan. This is done to screen for osteoporosis. You may have this done starting at age 26. Mammogram. This may be done every 1-2 years. Talk to your health care provider about how often you should have regular mammograms. Talk with your health care provider about your test results, treatment options, and if necessary, the need for more tests. Vaccines  Your health care provider may recommend certain vaccines, such as: Influenza vaccine. This is recommended every year. Tetanus, diphtheria, and acellular pertussis (Tdap, Td) vaccine. You may need a Td booster every 10 years. Zoster vaccine. You may need this after age 53. Pneumococcal 13-valent conjugate (PCV13) vaccine. One dose is recommended after age 26. Pneumococcal polysaccharide (PPSV23) vaccine. One dose is recommended after age 36. Talk to your health care provider about which screenings and  vaccines you need and how often you need  them. This information is not intended to replace advice given to you by your health care provider. Make sure you discuss any questions you have with your health care provider. Document Released: 04/04/2015 Document Revised: 11/26/2015 Document Reviewed: 01/07/2015 Elsevier Interactive Patient Education  2017 [employer] Prevention in the Home Falls can cause injuries. They can happen to people of all ages. There are many things you can do to make your home safe and to help prevent falls. What can I do on the outside of my home? Regularly fix the edges of walkways and driveways and fix any cracks. Remove anything that might make you trip as you walk through a door, such as a raised step or threshold. Trim any bushes or trees on the path to your home. Use bright outdoor lighting. Clear any walking paths of anything that might make someone trip, such as rocks or tools. Regularly check to see if handrails are loose or broken. Make sure that both sides of any steps have handrails. Any raised decks and porches should have guardrails on the edges. Have any leaves, snow, or ice cleared regularly. Use sand or salt on walking paths during winter. Clean up any spills in your garage right away. This includes oil or grease spills. What can I do in the bathroom? Use night lights. Install grab bars by the toilet and in the tub and shower. Do not use towel bars as grab bars. Use non-skid mats or decals in the tub or shower. If you need to sit down in the shower, use a plastic, non-slip stool. Keep the floor dry. Clean up any water that spills on the floor as soon as it happens. Remove soap buildup in the tub or shower regularly. Attach bath mats securely with double-sided non-slip rug tape. Do not have throw rugs and other things on the floor that can make you trip. What can I do in the bedroom? Use night lights. Make sure that you have a light by your bed that is easy to reach. Do not use  any sheets or blankets that are too big for your bed. They should not hang down onto the floor. Have a firm chair that has side arms. You can use this for support while you get dressed. Do not have throw rugs and other things on the floor that can make you trip. What can I do in the kitchen? Clean up any spills right away. Avoid walking on wet floors. Keep items that you use a lot in easy-to-reach places. If you need to reach something above you, use a strong step stool that has a grab bar. Keep electrical cords out of the way. Do not use floor polish or wax that makes floors slippery. If you must use wax, use non-skid floor wax. Do not have throw rugs and other things on the floor that can make you trip. What can I do with my stairs? Do not leave any items on the stairs. Make sure that there are handrails on both sides of the stairs and use them. Fix handrails that are broken or loose. Make sure that handrails are as long as the stairways. Check any carpeting to make sure that it is firmly attached to the stairs. Fix any carpet that is loose or worn. Avoid having throw rugs at the top or bottom of the stairs. If you do have throw rugs, attach them to the floor with carpet tape. Make  sure that you have a light switch at the top of the stairs and the bottom of the stairs. If you do not have them, ask someone to add them for you. What else can I do to help prevent falls? Wear shoes that: Do not have high heels. Have rubber bottoms. Are comfortable and fit you well. Are closed at the toe. Do not wear sandals. If you use a stepladder: Make sure that it is fully opened. Do not climb a closed stepladder. Make sure that both sides of the stepladder are locked into place. Ask someone to hold it for you, if possible. Clearly mark and make sure that you can see: Any grab bars or handrails. First and last steps. Where the edge of each step is. Use tools that help you move around (mobility aids)  if they are needed. These include: Canes. Walkers. Scooters. Crutches. Turn on the lights when you go into a dark area. Replace any light bulbs as soon as they burn out. Set up your furniture so you have a clear path. Avoid moving your furniture around. If any of your floors are uneven, fix them. If there are any pets around you, be aware of where they are. Review your medicines with your doctor. Some medicines can make you feel dizzy. This can increase your chance of falling. Ask your doctor what other things that you can do to help prevent falls. This information is not intended to replace advice given to you by your health care provider. Make sure you discuss any questions you have with your health care provider. Document Released: 01/02/2009 Document Revised: 08/14/2015 Document Reviewed: 04/12/2014 Elsevier Interactive Patient Education  2017 [employer].

## 2021-08-31 NOTE — Progress Notes (Signed)
Subjective:   Jennifer Blackburn is a 76 y.o. female who presents for Medicare Annual (Subsequent) preventive examination.  Review of Systems    I connected with  Antoine Primas on 08/31/21 by a audio enabled telemedicine application and verified that I am speaking with the correct person using two identifiers.  Patient Location: Home  Provider Location: Office/Clinic  I discussed the limitations of evaluation and management by telemedicine. The patient expressed understanding and agreed to proceed.  Cardiac Risk Factors include: none;advanced age (>12mn, >>50women);diabetes mellitus;hypertension     Objective:    Today's Vitals   08/31/21 1433  PainSc: 0-No pain   There is no height or weight on file to calculate BMI.     08/31/2021    2:36 PM 08/06/2020    1:58 PM 02/06/2020    2:59 PM 08/21/2019    8:28 AM 08/15/2019    2:00 PM 01/25/2019    8:02 AM 10/12/2017    9:08 AM  Advanced Directives  Does Patient Have a Medical Advance Directive? No No No No No No No  Would patient like information on creating a medical advance directive? No - Patient declined No - Patient declined Yes (MAU/Ambulatory/Procedural Areas - Information given) No - Patient declined No - Patient declined No - Patient declined No - Patient declined    Current Medications (verified) Outpatient Encounter Medications as of 08/31/2021  Medication Sig   acetaminophen (TYLENOL) 500 MG tablet Take 1,000 mg by mouth as needed (for pain.).   Cetirizine HCl 10 MG TBDP Take one tablet by mouth two times daily for allergy (Patient taking differently: Take 1 tablet by mouth daily.)   Cholecalciferol (VITAMIN D3) 50 MCG (2000 UT) TABS Take 2,000 Units by mouth daily.   gabapentin (NEURONTIN) 100 MG capsule TAKE 1 CAPSULE BY MOUTH 3 TIMES A DAY   glucose blood (ACCU-CHEK GUIDE) test strip Use as instructed to check blood sugar 2 times per day dx code E11.65   hydrOXYzine (ATARAX) 10 MG tablet Take one tablet twice  daily and two tablets at bedtime as needed for generalized itching.   imipramine (TOFRANIL) 50 MG tablet Take 1 tablet (50 mg total) by mouth at bedtime.   insulin aspart (NOVOLOG FLEXPEN) 100 UNIT/ML FlexPen Inject 10-15 Units into the skin 3 (three) times daily with meals. (Patient taking differently: Inject 15 Units into the skin 3 (three) times daily with meals.)   insulin glargine, 1 Unit Dial, (TOUJEO SOLOSTAR) 300 UNIT/ML Solostar Pen Inject 30-50 units two times daily (Patient taking differently: Inject 30 units every morning and 40 units every evening)   Insulin Pen Needle 32G X 4 MM MISC Use 4x a day   isosorbide mononitrate (IMDUR) 30 MG 24 hr tablet TAKE HALF A TABLET BY MOUTH EVERY DAY   Lancets (ACCU-CHEK MULTICLIX) lancets Use as instructed three times daily dx 250.01   levothyroxine (SYNTHROID) 125 MCG tablet TAKE 1 TABLET BY MOUTH EVERY DAY BEFORE BREAKFAST   metFORMIN (GLUCOPHAGE) 1000 MG tablet Take 1 tablet (1,000 mg total) by mouth 2 (two) times daily.   metoprolol tartrate (LOPRESSOR) 25 MG tablet Take 1 tablet (25 mg total) by mouth every morning. May also take 1 tablet (25 mg total) as needed (as needed in the evening for heart rate greater than 100bpm or blood pressure over 145/90).   pantoprazole (PROTONIX) 40 MG tablet Take 1 tablet (40 mg total) by mouth daily.   PARoxetine (PAXIL) 20 MG tablet TAKE 1 TABLET EVERY  DAY   pravastatin (PRAVACHOL) 80 MG tablet TAKE 1 TABLET EVERY DAY   Semaglutide, 1 MG/DOSE, (OZEMPIC, 1 MG/DOSE,) 4 MG/3ML SOPN Inject 1 mg into the skin once a week.   vitamin B-12 (CYANOCOBALAMIN) 1000 MCG tablet Take 1 tablet (1,000 mcg total) by mouth daily.   No facility-administered encounter medications on file as of 08/31/2021.    Allergies (verified) Lipitor [atorvastatin calcium], Actos [pioglitazone], Crestor [rosuvastatin], Daypro [oxaprozin], Nsaids, and Sulfonamide derivatives   History: Past Medical History:  Diagnosis Date   Anxiety     Arthritis    CAD (coronary artery disease)    Depression    Diabetes mellitus type II    without complication   DJD (degenerative joint disease) of lumbar spine    Hypercholesteremia    Hyperlipidemia    Hypertension    benign    Hypothyroidism    Thyroid cancer ([place]) 2001   Past Surgical History:  Procedure Laterality Date   ABDOMINAL HYSTERECTOMY     CARDIAC CATHETERIZATION     CATARACT EXTRACTION W/PHACO Right 07/17/2012   Procedure: CATARACT EXTRACTION PHACO AND INTRAOCULAR LENS PLACEMENT ([place]);  Surgeon: Tonny Branch, MD;  Location: [facility];  Service: Ophthalmology;  Laterality: Right;  CDE:25.51   CATARACT EXTRACTION W/PHACO Left 09/17/2016   Procedure: CATARACT EXTRACTION PHACO AND INTRAOCULAR LENS PLACEMENT LEFT EYE;  Surgeon: Tonny Branch, MD;  Location: [facility];  Service: Ophthalmology;  Laterality: Left;  CDE: 19.23   COLONOSCOPY     COLONOSCOPY N/A 01/15/2014   Procedure: COLONOSCOPY;  Surgeon: Daneil Dolin, MD;  Location: [facility];  Service: Endoscopy;  Laterality: N/A;  9:00 AM   COLONOSCOPY WITH PROPOFOL N/A 01/25/2019   Procedure: COLONOSCOPY WITH PROPOFOL;  Surgeon: Daneil Dolin, MD; diverticulosis in the entire colon, otherwise normal exam.  No repeat colonoscopy due to age.   DOPPLER ECHOCARDIOGRAPHY     ESOPHAGOGASTRODUODENOSCOPY (EGD) WITH PROPOFOL N/A 01/25/2019   Procedure: ESOPHAGOGASTRODUODENOSCOPY (EGD) WITH PROPOFOL;  Surgeon: Daneil Dolin, MD; normal esophagus without dilation due to inability to pass dilator beyond the hypopharynx, 1 small nonbleeding gastric ulcer s/p biopsied, otherwise normal exam.  Pathology with ulcer with reactive changes, no H. pylori, metaplasia, dysplasia, or malignancy.   ESOPHAGOGASTRODUODENOSCOPY (EGD) WITH PROPOFOL N/A 08/21/2019   Procedure: ESOPHAGOGASTRODUODENOSCOPY (EGD) WITH PROPOFOL;  Surgeon: Daneil Dolin, MD; Normal esophagus. Previously noted gastric ulcer completely healed. Normal examined duodenum.    JOINT  REPLACEMENT  07/01/2010   left hip   KNEE SURGERY Right    arthroscopy   left hip replaced  07/01/2010   SPINE SURGERY  2006   cervical   stress dipyridamole myocardial perfusion     THYROIDECTOMY     TONSILLECTOMY     VESICOVAGINAL FISTULA CLOSURE W/ TAH     Family History  Problem Relation Age of Onset   Heart attack Father    Heart failure Mother    Asthma Daughter    Sleep apnea Son        CPAP   Colon cancer Neg Hx    Social History   Socioeconomic History   Marital status: Married    Spouse name: Not on file   Number of children: 2   Years of education: 14   Highest education level: Not on file  Occupational History   Occupation: retired     Comment: bank  Tobacco Use   Smoking status: Never    Passive exposure: Yes   Smokeless tobacco: Never   Tobacco comments:  Husband smokes in the home  Vaping Use   Vaping Use: Never used  Substance and Sexual Activity   Alcohol use: No   Drug use: No   Sexual activity: Never  Other Topics Concern   Not on file  Social History Narrative   Lives with husband, at home   No caffeine   Social Determinants of Health   Financial Resource Strain: Low Risk  (08/31/2021)   Overall Financial Resource Strain (CARDIA)    Difficulty of Paying Living Expenses: Not hard at all  Food Insecurity: No Food Insecurity (08/31/2021)   Hunger Vital Sign    Worried About Running Out of Food in the Last Year: Never true    Ran Out of Food in the Last Year: Never true  Transportation Needs: No Transportation Needs (08/31/2021)   PRAPARE - Hydrologist (Medical): No    Lack of Transportation (Non-Medical): No  Physical Activity: Insufficiently Active (08/31/2021)   Exercise Vital Sign    Days of Exercise per Week: 7 days    Minutes of Exercise per Session: 10 min  Stress: No Stress Concern Present (08/31/2021)   [place]    Feeling of Stress  : Not at all  Social Connections: [place] (08/31/2021)   Social Connection and Isolation Panel [NHANES]    Frequency of Communication with Friends and Family: More than three times a week    Frequency of Social Gatherings with Friends and Family: More than three times a week    Attends Religious Services: More than 4 times per year    Active Member of [employer] or Organizations: Yes    Attends Archivist Meetings: 1 to 4 times per year    Marital Status: Married    Tobacco Counseling Counseling given: Not Answered Tobacco comments: Husband smokes in the home   Clinical Intake:  Pre-visit preparation completed: Yes Jennifer Blackburn , Thank you for taking time to come for your Medicare Wellness Visit. I appreciate your ongoing commitment to your health goals. Please review the following plan we discussed and let me know if I can assist you in the future.   These are the goals we discussed:  Goals      Blood Pressure < 140/90     Exercise 150 minutes per week (moderate activity)     HEMOGLOBIN A1C < 7.0     LDL CALC < 70     LIFESTYLE - DECREASE FALLS RISK     Medication Management     Patient Goals/Self-Care Activities Patient will:  Focus on medication adherence by keeping up with prescription refills and either using a pill box or reminders to take your medications at the prescribed times Check blood sugar twice a day at the following times: fasting (at least 8 hours since last food consumption), 1-2 hours after lunch, and whenever patient experiences symptoms of hypo/hyperglycemia, document, and provide at future appointments Check blood pressure at least once daily, document, and provide at future appointments Engage in dietary modifications by less frequent dining out, fewer sweetened foods & beverages, and better food choices       Patient Stated     Keep A1C down.        This is a list of the screening recommended for you and due dates:  Health  Maintenance  Topic Date Due   Zoster (Shingles) Vaccine (1 of 2) Never done   COVID-19 Vaccine (4 -  Booster for Moderna series) 02/28/2021   Tetanus Vaccine  05/23/2022*   Flu Shot  10/20/2021   Eye exam for diabetics  10/28/2021   Complete foot exam   01/21/2022   Hemoglobin A1C  02/26/2022   Urine Protein Check  04/21/2022   Pneumonia Vaccine  Completed   DEXA scan (bone density measurement)  Completed   Hepatitis C Screening: USPSTF Recommendation to screen - Ages 24-79 yo.  Completed   HPV Vaccine  Aged Out   Colon Cancer Screening  Discontinued  *Topic was postponed. The date shown is not the original due date.     Pain : No/denies pain Pain Score: 0-No pain     BMI - recorded: 29.92 Nutritional Status: BMI 25 -29 Overweight Nutritional Risks: None Diabetes: Yes CBG done?: No Did pt. bring in CBG monitor from home?: No  How often do you need to have someone help you when you read instructions, pamphlets, or other written materials from your doctor or pharmacy?: 1 - Never What is the last grade level you completed in school?: 12+  Diabetic? Yes Nutrition Risk Assessment:  Has the patient had any N/V/D within the last 2 months?  No  Does the patient have any non-healing wounds?  No  Has the patient had any unintentional weight loss or weight gain?  No   Diabetes:  Is the patient diabetic?  Yes  If diabetic, was a CBG obtained today?  No  Did the patient bring in their glucometer from home?  No  How often do you monitor your CBG's? Twice daily..   Financial Strains and Diabetes Management:  Are you having any financial strains with the device, your supplies or your medication? No .  Does the patient want to be seen by Chronic Care Management for management of their diabetes?  No  Would the patient like to be referred to a Nutritionist or for Diabetic Management?  No   Diabetic Exams:  Diabetic Eye Exam: Completed 10/28/20 Diabetic Foot Exam: Completed  01/21/21    Interpreter Needed?: No      Activities of Daily Living    08/31/2021    2:37 PM  In your present state of health, do you have any difficulty performing the following activities:  Hearing? 0  Vision? 0  Difficulty concentrating or making decisions? 0  Walking or climbing stairs? 0  Dressing or bathing? 0  Doing errands, shopping? 0  Preparing Food and eating ? N  Using the Toilet? N  In the past six months, have you accidently leaked urine? N  Do you have problems with loss of bowel control? N  Managing your Medications? N  Managing your Finances? N  Housekeeping or managing your Housekeeping? N    Patient Care Team: Fayrene Helper, MD as PCP - General Gwenlyn Found Pearletha Forge, MD as PCP - Cardiology (Cardiology) Sherlynn Stalls, MD (Ophthalmology) Lorretta Harp, MD as Consulting Physician (Cardiology) Lenard Simmer, MD as Attending Physician (Endocrinology) Gala Romney Cristopher Estimable, MD as Consulting Physician (Gastroenterology) Cassandria Anger, MD as Consulting Physician (Endocrinology) Kassie Mends, RN as [place] any recent Medical Services you may have received from other than [facility] providers in the past year (date may be approximate).     Assessment:   This is a routine wellness examination for Jennifer Blackburn.  Hearing/Vision screen No results found.  Dietary issues and exercise activities discussed: Current Exercise Habits: Home exercise routine, Type of exercise: walking, Time (Minutes):  10, Frequency (Times/Week): 7, Weekly Exercise (Minutes/Week): 70, Intensity: Mild, Exercise limited by: None identified   Goals Addressed             This Visit's Progress    Patient Stated       Keep A1C down.      Depression Screen    08/31/2021    2:37 PM 08/31/2021    2:35 PM 05/22/2021    3:59 PM 12/25/2020    1:11 PM 09/23/2020    9:44 AM 08/06/2020    2:01 PM 07/16/2020    1:12 PM  PHQ 2/9 Scores  PHQ - 2  Score 0 0 0 0 0 0 0    Fall Risk    08/31/2021    2:37 PM 06/02/2021    3:19 PM 05/22/2021    3:57 PM 05/22/2021    3:22 PM 12/25/2020    1:10 PM  [place] in the past year? '1 1 1 1 '$ 0  Number falls in past yr: 0 '1 1 1 '$ 0  Injury with Fall? 0 0 1 0 0  Risk for fall due to : Impaired balance/gait  History of fall(s);Impaired mobility;Impaired balance/gait  No Fall Risks  Follow up Falls evaluation completed  Education provided;Falls prevention discussed;Falls evaluation completed  Falls evaluation completed    FALL RISK PREVENTION PERTAINING TO THE HOME:  Any stairs in or around the home? Yes  If so, are there any without handrails? Yes  Home free of loose throw rugs in walkways, pet beds, electrical cords, etc? Yes  Adequate lighting in your home to reduce risk of falls? Yes   ASSISTIVE DEVICES UTILIZED TO PREVENT FALLS:  Life alert? No  Use of a cane, walker or w/c? No  Grab bars in the bathroom? No  Shower chair or bench in shower? No  Elevated toilet seat or a handicapped toilet? Yes     Cognitive Function:    08/31/2021    2:38 PM  MMSE - Mini Mental State Exam  Not completed: Unable to complete        08/31/2021    2:38 PM 08/06/2019    2:42 PM 08/02/2018    9:49 AM 01/03/2017    2:32 PM  6CIT Screen  What Year? 0 points 0 points 0 points 0 points  What month? 0 points 0 points 0 points 0 points  What time? 0 points 0 points 0 points 0 points  Count back from 20 0 points 0 points 0 points 0 points  Months in reverse 0 points 0 points 0 points 0 points  Repeat phrase 0 points 0 points 0 points 0 points  Total Score 0 points 0 points 0 points 0 points    Immunizations Immunization History  Administered Date(s) Administered   Fluad Quad(high Dose 65+) 12/18/2018, 12/25/2019, 01/21/2021   Influenza Split 03/10/2012   Influenza Whole 01/04/2007   Influenza,inj,Quad PF,6+ Mos 11/28/2012, 12/31/2013, 01/31/2017, 01/10/2018   Influenza,inj,quad, With  Preservative 12/20/2016   Influenza-Unspecified 02/09/2016   Moderna SARS-COV2 Booster Vaccination 01/24/2020   Moderna Sars-Covid-2 Vaccination 04/29/2019, 05/30/2019, 01/03/2021   Pneumococcal Conjugate-13 07/15/2014   Pneumococcal Polysaccharide-23 02/17/2004, 09/07/2012   Tdap 12/21/2010    TDAP status: Due, Education has been provided regarding the importance of this vaccine. Advised may receive this vaccine at local pharmacy or Health Dept. Aware to provide a copy of the vaccination record if obtained from local pharmacy or Health Dept. Verbalized acceptance and understanding.  Flu Vaccine status: Up  to date  Pneumococcal vaccine status: Up to date  Covid-19 vaccine status: Completed vaccines  Qualifies for Shingles Vaccine? Yes   Zostavax completed No   Shingrix Completed?: No.    Education has been provided regarding the importance of this vaccine. Patient has been advised to call insurance company to determine out of pocket expense if they have not yet received this vaccine. Advised may also receive vaccine at local pharmacy or Health Dept. Verbalized acceptance and understanding.  Screening Tests Health Maintenance  Topic Date Due   Zoster Vaccines- Shingrix (1 of 2) Never done   COVID-19 Vaccine (4 - Booster for Moderna series) 02/28/2021   TETANUS/TDAP  05/23/2022 (Originally 12/20/2020)   INFLUENZA VACCINE  10/20/2021   OPHTHALMOLOGY EXAM  10/28/2021   FOOT EXAM  01/21/2022   HEMOGLOBIN A1C  02/26/2022   URINE MICROALBUMIN  04/21/2022   Pneumonia Vaccine 29+ Years old  Completed   DEXA SCAN  Completed   Hepatitis C Screening  Completed   HPV VACCINES  Aged Out   COLONOSCOPY (Pts 45-86yr Insurance coverage will need to be confirmed)  Discontinued    Health Maintenance  Health Maintenance Due  Topic Date Due   Zoster Vaccines- Shingrix (1 of 2) Never done   COVID-19 Vaccine (4 - Booster for Moderna series) 02/28/2021    Colorectal cancer screening: Type of  screening: Colonoscopy. Completed 01/25/2019. Repeat every 5 years  Mammogram status: No longer required due to age.  Bone Density status: Completed 08/14/19. Results reflect: Bone density results: OSTEOPOROSIS. Repeat every 2 years.  Lung Cancer Screening: (Low Dose CT Chest recommended if Age 77-80years, 30 pack-year currently smoking OR have quit w/in 15years.) does not qualify.   Lung Cancer Screening Referral: No  Additional Screening:  Hepatitis C Screening: does qualify; Completed 07/13/16  Vision Screening: Recommended annual ophthalmology exams for early detection of glaucoma and other disorders of the eye. Is the patient up to date with their annual eye exam?  Yes  Who is the provider or what is the name of the office in which the patient attends annual eye exams? Dr.Cotter If pt is not established with a provider, would they like to be referred to a provider to establish care? No .   Dental Screening: Recommended annual dental exams for proper oral hygiene  Community Resource Referral / Chronic Care Management: CRR required this visit?  No   CCM required this visit?  No      Plan:     I have personally reviewed and noted the following in the patient's chart:   Medical and social history Use of alcohol, tobacco or illicit drugs  Current medications and supplements including opioid prescriptions.  Functional ability and status Nutritional status Physical activity Advanced directives List of other physicians Hospitalizations, surgeries, and ER visits in previous 12 months Vitals Screenings to include cognitive, depression, and falls Referrals and appointments  In addition, I have reviewed and discussed with patient certain preventive protocols, quality metrics, and best practice recommendations. A written personalized care plan for preventive services as well as general preventive health recommendations were provided to patient.     KQuentin Angst  [place]  08/31/2021

## 2021-09-01 DIAGNOSIS — L680 Hirsutism: Secondary | ICD-10-CM | POA: Diagnosis not present

## 2021-09-01 DIAGNOSIS — Z419 Encounter for procedure for purposes other than remedying health state, unspecified: Secondary | ICD-10-CM | POA: Diagnosis not present

## 2021-09-01 DIAGNOSIS — L720 Epidermal cyst: Secondary | ICD-10-CM | POA: Diagnosis not present

## 2021-09-01 DIAGNOSIS — L738 Other specified follicular disorders: Secondary | ICD-10-CM | POA: Diagnosis not present

## 2021-09-02 ENCOUNTER — Other Ambulatory Visit: Payer: Self-pay | Admitting: Cardiovascular Disease

## 2021-09-02 ENCOUNTER — Other Ambulatory Visit: Payer: Self-pay | Admitting: Internal Medicine

## 2021-09-02 DIAGNOSIS — E1159 Type 2 diabetes mellitus with other circulatory complications: Secondary | ICD-10-CM

## 2021-09-03 ENCOUNTER — Ambulatory Visit (INDEPENDENT_AMBULATORY_CARE_PROVIDER_SITE_OTHER): Payer: Medicare HMO | Admitting: Family Medicine

## 2021-09-03 ENCOUNTER — Encounter: Payer: Self-pay | Admitting: Family Medicine

## 2021-09-03 VITALS — BP 116/69 | HR 89 | Resp 16 | Ht 65.0 in | Wt 177.8 lb

## 2021-09-03 DIAGNOSIS — D539 Nutritional anemia, unspecified: Secondary | ICD-10-CM

## 2021-09-03 DIAGNOSIS — I10 Essential (primary) hypertension: Secondary | ICD-10-CM

## 2021-09-03 DIAGNOSIS — F419 Anxiety disorder, unspecified: Secondary | ICD-10-CM | POA: Diagnosis not present

## 2021-09-03 DIAGNOSIS — E1159 Type 2 diabetes mellitus with other circulatory complications: Secondary | ICD-10-CM

## 2021-09-03 DIAGNOSIS — E890 Postprocedural hypothyroidism: Secondary | ICD-10-CM | POA: Diagnosis not present

## 2021-09-03 DIAGNOSIS — L299 Pruritus, unspecified: Secondary | ICD-10-CM

## 2021-09-03 DIAGNOSIS — F32A Depression, unspecified: Secondary | ICD-10-CM

## 2021-09-03 NOTE — Patient Instructions (Addendum)
F/U in 7 weeks, call if you need me sooner  Increase hydroxyzine take one tablet THREE times daily, this will help with the itching  Take certrizine one every day  Find more outside of the home activities to do on a regular basis  PLEASE collect and start the new LOWER dose of levothyroxine prescribed 125 mcg daily, it is at [facility]   CBC, B12, Fasting lipid , cmp and EGFR and TSH 3 days before next visit  Thanks for choosing [facility], we consider it a privelige to serve you.

## 2021-09-09 ENCOUNTER — Telehealth: Payer: Self-pay | Admitting: Internal Medicine

## 2021-09-09 ENCOUNTER — Encounter: Payer: Self-pay | Admitting: Pharmacist Clinician (PhC)/ Clinical Pharmacy Specialist

## 2021-09-09 ENCOUNTER — Ambulatory Visit (INDEPENDENT_AMBULATORY_CARE_PROVIDER_SITE_OTHER): Payer: Medicare HMO | Admitting: Pharmacist Clinician (PhC)/ Clinical Pharmacy Specialist

## 2021-09-09 DIAGNOSIS — I10 Essential (primary) hypertension: Secondary | ICD-10-CM

## 2021-09-09 NOTE — Telephone Encounter (Signed)
Patient came to office regarding patient assistance for Ozempic. Is stating that [employer] is telling her that we still need to send "the information they need" could not explain information. Patient requested '2MG'$  sample - was given sample(ok per Gerda Diss).   Patient requested a call at [phone] - OK to leave message

## 2021-09-09 NOTE — Progress Notes (Signed)
09/09/2021 Jennifer Blackburn 1945-08-18 [id number]   HPI:  Jennifer Blackburn is a 76 y.o. female patient of Dr Gwenlyn Found, with a PMH below who presents today for hypertension clinic evaluation.  She was seen by Dr. Gwenlyn Found in early March, then PCP requested urgent follow up after having an episode of symptomatic hypotension with unknown cause.  PCP stopped furosemide, quinapril and verapamil as well as cut metoprolol tartrate dose to 12.5 mg only in the evenings.  She could increase to 1/2 tablet twice daily for HR > 100 or BP > 145/90.   Dr Gwenlyn Found saw her a week later at which time her pressure was at 108/59.  He asked that she keep a log and return in a month for follow up.  At that follow up she had experienced an episode of hypotension with diastolic < 50 (did not recall systolic).  She was off all BP medications and we had her restart with just metoprolol tart 25 mg bid.    She returns today for a 2 month follow up.  Reports home blood pressure doing well and has no concerns today.  She continues to monitor BP daily.  No significant changes to eating habits or lifestyle since last visit.    Past Medical History: CAD Minimal by cath 2005  hyperlipidemia 1./23 LDL 70 on pravastatin 80  DM2 2/23 A1c 8.8 on metformin, novolog, Ozempic, Toujeo (no change in June 2023)  GERD On pantoprazole  hypothyroid TSH on levothyroxine 137 mcg     Blood Pressure Goal:  130/80  Current Medications: metoprolol tart 25 mg qd-bid depending on BP; isosorbide mono 15 mg qd  (Previously on quinapril 10 mg, furosemide 20 mg, verapamil 180 mg, potassium)  Family Hx: mother had CHF, died at 75, father from MI at 10, 2 children, no BP issues at this point  Social Hx: no tobacco (does get secondhand); no alcohol, coffee or tea daily - was up to 3 cups per day but cut back to 2 2/2 acid reflux - avoids chocolate, caffeine, mint  Diet: more home cooked meals, no added salt; eats more vegetables/fruits, not a lot  meats, uses beans for protein   Exercise: limited by back issues - put off back surgery  Home BP readings:    21 AM readings average  128/77  (previous average 127/78)    19 PM readings average 127/71  (previous average 131/79 (range  Intolerances: statins, NSAID's, sulfa antibiotics, Actos  Labs: 1/23:  Na 141, K 4.8, Glu 175, BUN 14, SCr 0.82, GFR 74   Wt Readings from Last 3 Encounters:  09/03/21 177 lb 12.8 oz (80.6 kg)  08/27/21 179 lb 12.8 oz (81.6 kg)  07/15/21 180 lb 9.6 oz (81.9 kg)   BP Readings from Last 3 Encounters:  09/09/21 129/75  09/03/21 116/69  08/27/21 124/76   Pulse Readings from Last 3 Encounters:  09/09/21 96  09/03/21 89  08/27/21 74    Current Outpatient Medications  Medication Sig Dispense Refill   Cetirizine HCl 10 MG TBDP Take one tablet by mouth two times daily for allergy (Patient taking differently: Take 1 tablet by mouth daily.) 60 tablet 5   Cholecalciferol (VITAMIN D3) 50 MCG (2000 UT) TABS Take 2,000 Units by mouth daily.     gabapentin (NEURONTIN) 100 MG capsule TAKE 1 CAPSULE BY MOUTH 3 TIMES A DAY 270 capsule 1   glucose blood (ACCU-CHEK GUIDE) test strip Use as instructed to check blood sugar 2  times per day dx code E11.65 100 each 3   hydrOXYzine (ATARAX) 10 MG tablet Take one tablet twice daily and two tablets at bedtime as needed for generalized itching. 360 tablet 1   imipramine (TOFRANIL) 50 MG tablet Take 1 tablet (50 mg total) by mouth at bedtime. 90 tablet 2   insulin aspart (NOVOLOG FLEXPEN) 100 UNIT/ML FlexPen Inject 10-15 Units into the skin 3 (three) times daily with meals. (Patient taking differently: Inject 15 Units into the skin 3 (three) times daily with meals.) 15 mL 2   Insulin Pen Needle 32G X 4 MM MISC Use 4x a day 300 each 3   isosorbide mononitrate (IMDUR) 30 MG 24 hr tablet TAKE HALF A TABLET BY MOUTH EVERY DAY 45 tablet 3   Lancets (ACCU-CHEK MULTICLIX) lancets Use as instructed three times daily dx 250.01 100 each  5   levothyroxine (SYNTHROID) 125 MCG tablet TAKE 1 TABLET BY MOUTH EVERY DAY BEFORE BREAKFAST 45 tablet 3   metFORMIN (GLUCOPHAGE) 1000 MG tablet Take 1 tablet (1,000 mg total) by mouth 2 (two) times daily. 180 tablet 1   metoprolol tartrate (LOPRESSOR) 50 MG tablet TAKE 1 TABLET BY MOUTH TWICE A DAY (Patient taking differently: Take 25 mg by mouth in the morning and at bedtime.) 180 tablet 3   pantoprazole (PROTONIX) 40 MG tablet Take 1 tablet (40 mg total) by mouth daily. 30 tablet 11   PARoxetine (PAXIL) 20 MG tablet TAKE 1 TABLET EVERY DAY 90 tablet 1   pravastatin (PRAVACHOL) 80 MG tablet TAKE 1 TABLET EVERY DAY 90 tablet 1   TOUJEO SOLOSTAR 300 UNIT/ML Solostar Pen INJECT 30-50 UNITS TWO TIMES DAILY 10.5 mL 1   Semaglutide, 1 MG/DOSE, (OZEMPIC, 1 MG/DOSE,) 4 MG/3ML SOPN Inject 1 mg into the skin once a week. (Patient taking differently: Inject 2 mg into the skin once a week.) 9 mL 3   vitamin B-12 (CYANOCOBALAMIN) 1000 MCG tablet Take 1 tablet (1,000 mcg total) by mouth daily. 30 tablet 5   No current facility-administered medications for this visit.    Allergies  Allergen Reactions   Lipitor [Atorvastatin Calcium] Other (See Comments)    Muscle aches   Actos [Pioglitazone] Other (See Comments)    Peripheral edema   Crestor [Rosuvastatin] Other (See Comments)    Muscle aches   Daypro [Oxaprozin] Hives   Nsaids Hives   Sulfonamide Derivatives Hives    Past Medical History:  Diagnosis Date   Anxiety    Arthritis    CAD (coronary artery disease)    Depression    Diabetes mellitus type II    without complication   DJD (degenerative joint disease) of lumbar spine    Hypercholesteremia    Hyperlipidemia    Hypertension    benign    Hypothyroidism    Thyroid cancer ([place]) 2001    Blood pressure 129/75, pulse 96, last menstrual period 11/11/2016.  BP standing 122/62, standing at 1 minute 128/74  Hypertension Patient with essential hypertension, now doing well on just  metoprolol tartrate.  No recent issues with hypotension and home BP readings all within acceptable range.  Will continue with metoprolol and patient can check home pressures 2-3 times per week.  Any concerns and she will reach out to the office.   Tommy Medal PharmD CPP [facility] [address] [place], [place] [address] [phone]

## 2021-09-09 NOTE — Assessment & Plan Note (Signed)
Patient with essential hypertension, now doing well on just metoprolol tartrate.  No recent issues with hypotension and home BP readings all within acceptable range.  Will continue with metoprolol and patient can check home pressures 2-3 times per week.  Any concerns and she will reach out to the office.

## 2021-09-09 NOTE — Patient Instructions (Addendum)
Check your blood pressure at home occasionally and keep record of the readings.  Take your BP meds as follows:  Continue with current medications  Bring all of your meds, your BP cuff and your record of home blood pressures to your next appointment.  Exercise as you're able, try to walk approximately 30 minutes per day.  Keep salt intake to a minimum, especially watch canned and prepared boxed foods.  Eat more fresh fruits and vegetables and fewer canned items.  Avoid eating in fast food restaurants.    HOW TO TAKE YOUR BLOOD PRESSURE: Rest 5 minutes before taking your blood pressure.  Don't smoke or drink caffeinated beverages for at least 30 minutes before. Take your blood pressure before (not after) you eat. Sit comfortably with your back supported and both feet on the floor (don't cross your legs). Elevate your arm to heart level on a table or a desk. Use the proper sized cuff. It should fit smoothly and snugly around your bare upper arm. There should be enough room to slip a fingertip under the cuff. The bottom edge of the cuff should be 1 inch above the crease of the elbow. Ideally, take 3 measurements at one sitting and record the average.

## 2021-09-14 ENCOUNTER — Encounter: Payer: Self-pay | Admitting: Family Medicine

## 2021-09-14 NOTE — Assessment & Plan Note (Signed)
Uncontrolled, working with [name] is reminded of the importance of commitment to daily physical activity for 30 minutes or more, as able and the need to limit carbohydrate intake to 30 to 60 grams per meal to help with blood sugar control.   The need to take medication as prescribed, test blood sugar as directed, and to call between visits if there is a concern that blood sugar is uncontrolled is also discussed.   Jennifer Blackburn is reminded of the importance of daily foot exam, annual eye examination, and good blood sugar, blood pressure and cholesterol control.     Latest Ref Rng & Units 08/27/2021    2:40 PM 04/28/2021    1:30 PM 04/21/2021   11:02 AM 01/28/2021   11:47 AM 01/12/2021    5:54 PM  Diabetic Labs  HbA1c 4.0 - 5.6 % 8.8  8.8      Micro/Creat Ratio 0 - 29 mg/g creat   12  13    Chol 100 - 199 mg/dL   409  811    HDL >91 mg/dL   37  43    Calc LDL 0 - 99 mg/dL   70  478    Triglycerides 0 - 149 mg/dL   295  621    Creatinine 0.57 - 1.00 mg/dL   3.08  6.57  8.46       09/09/2021    1:40 PM 09/03/2021    2:41 PM 08/27/2021    2:22 PM 07/15/2021    1:55 PM 06/04/2021    1:26 PM 06/02/2021    3:39 PM 06/02/2021    3:18 PM  BP/Weight  Systolic BP 129 116 124 144 108 104 126  Diastolic BP 75 69 76 72 59 78 60  Wt. (Lbs)  177.8 179.8 180.6 180.6  178.8  BMI  29.59 kg/m2 29.92 kg/m2 30.05 kg/m2 30.05 kg/m2  29.75 kg/m2      Latest Ref Rng & Units 01/21/2021    2:00 PM 10/28/2020   12:00 AM  Foot/eye exam completion dates  Eye Exam No Retinopathy  No Retinopathy      Foot Form Completion  Done      This result is from an external source.

## 2021-10-13 ENCOUNTER — Telehealth: Payer: Self-pay | Admitting: *Deleted

## 2021-10-13 NOTE — Chronic Care Management (AMB) (Signed)
  Chronic Care Management Note  10/13/2021 Name: Jennifer Blackburn MRN: [id number] DOB: 1945-03-31  Jennifer Blackburn is a 76 y.o. year old female who is a primary care patient of Fayrene Helper, MD and is actively engaged with the care management team. I reached out to Jennifer Blackburn by phone today to assist with re-scheduling an initial visit with the RN Case Manager  Follow up plan: A telephone outreach attempt made. Patient declines further follow up and engagement by the care management team. Appropriate care team members and provider have been notified via electronic communication. The care management team is available to follow up with the patient after provider conversation with the patient regarding recommendation for care management engagement and subsequent re-referral to the care management team.   [place]  Direct Dial: [phone]

## 2021-10-15 ENCOUNTER — Encounter: Payer: Self-pay | Admitting: *Deleted

## 2021-10-15 NOTE — Chronic Care Management (AMB) (Signed)
   10/15/2021  Jennifer Blackburn October 09, 1945 [id number]   ERROR

## 2021-10-23 ENCOUNTER — Telehealth: Payer: Self-pay

## 2021-10-23 NOTE — Telephone Encounter (Signed)
Called and lvm for pt requesting she stop by the office and complete new patient assistance paperwork. New paperwork left at the front desk for pt to complete.

## 2021-10-23 NOTE — Telephone Encounter (Signed)
Pt lvm advising patient assistance [employer] paperwork not received.

## 2021-10-28 ENCOUNTER — Ambulatory Visit: Payer: Medicare HMO | Admitting: Family Medicine

## 2021-11-02 ENCOUNTER — Encounter: Payer: Self-pay | Admitting: Internal Medicine

## 2021-11-06 NOTE — Telephone Encounter (Signed)
Patient picked up sample and dropped of patient assistance paper work

## 2021-11-09 ENCOUNTER — Other Ambulatory Visit: Payer: Self-pay | Admitting: Family Medicine

## 2021-11-10 ENCOUNTER — Other Ambulatory Visit: Payer: Self-pay | Admitting: Family Medicine

## 2021-11-19 ENCOUNTER — Ambulatory Visit (HOSPITAL_COMMUNITY)
Admission: RE | Admit: 2021-11-19 | Discharge: 2021-11-19 | Disposition: A | Discharge status: Home / Self Care | Payer: Medicare HMO | Source: Ambulatory Visit | Attending: Internal Medicine | Admitting: Internal Medicine

## 2021-11-19 ENCOUNTER — Ambulatory Visit (INDEPENDENT_AMBULATORY_CARE_PROVIDER_SITE_OTHER): Payer: Medicare HMO | Admitting: Internal Medicine

## 2021-11-19 ENCOUNTER — Encounter: Payer: Self-pay | Admitting: Internal Medicine

## 2021-11-19 VITALS — BP 122/76 | HR 88 | Ht 65.0 in | Wt 177.8 lb

## 2021-11-19 DIAGNOSIS — M79622 Pain in left upper arm: Secondary | ICD-10-CM | POA: Diagnosis not present

## 2021-11-19 DIAGNOSIS — M19012 Primary osteoarthritis, left shoulder: Secondary | ICD-10-CM | POA: Diagnosis not present

## 2021-11-19 DIAGNOSIS — M778 Other enthesopathies, not elsewhere classified: Secondary | ICD-10-CM | POA: Diagnosis not present

## 2021-11-19 NOTE — Progress Notes (Signed)
Acute Office Visit  Subjective:     Patient ID: Jennifer Blackburn, female    DOB: 1946/02/15, 76 y.o.   MRN: [id number]  Chief Complaint  Patient presents with   Fall    Arm, leg and back pain. Fell on 11/10/2021 landed on left side and pain seems to be getting worse and going more downwards.    Jennifer Blackburn is a 76 year old woman seen today for an acute visit.  She presents for evaluation of left upper arm and shoulder pain after recent fall.  Jennifer Blackburn reports falling last week on 8/22.  She was carrying laundry when she tripped while walking out of a bedroom into her hallway.  She fell forward, landing on her left shoulder.  She did not hit her head and denies LOC.  Since her fall, she has experienced pain in her left arm and shoulder, back, and legs.  The pain in her back and legs has largely subsided, however she continues to experience pain in her left upper arm and shoulder.  She states that she was previously unable to use her left arm due to pain, however her range of motion is gradually improving.  She is still unable to lift her left arm above her head and has pain over the lateral aspect of the left shoulder.  Jennifer Blackburn denies numbness/tingling in her left arm.  She does endorse some discomfort around the left elbow.  There is no pain in her neck, back, or right arm.  Jennifer Blackburn has been taking Tylenol 1000 mg twice daily for pain relief, which has been effective.  Overall she feels as though her pain is improving, however she continues to have significant discomfort in her left shoulder and arm.  Review of Systems  Musculoskeletal:        Left upper arm/shoulder pain  All other systems reviewed and are negative.     Objective:    BP 122/76   Pulse 88   Ht '5\' 5"'$  (1.651 m)   Wt 177 lb 12.8 oz (80.6 kg)   LMP 11/11/2016   SpO2 99%   BMI 29.59 kg/m   Physical Exam Constitutional:      General: She is not in acute distress.    Appearance: Normal appearance. She is not  toxic-appearing.  Cardiovascular:     Rate and Rhythm: Normal rate and regular rhythm.  Pulmonary:     Effort: Pulmonary effort is normal.     Breath sounds: Normal breath sounds.  Abdominal:     General: Abdomen is flat. Bowel sounds are normal.     Palpations: Abdomen is soft.     Tenderness: There is no abdominal tenderness.  Musculoskeletal:     Comments: No obvious deformity or bruising on inspection of the left shoulder and upper arm.  There is tenderness to palpation over the lateral aspect of the left upper arm.  No crepitus, tenderness, or obvious deformity appreciated on palpation of the clavicle.  ROM of the left shoulder is reduced.  She is unable to abduct above 90 degrees.  IR/ER reduced to be on 45 degrees.  Strength is reduced in the left arm compared to the right.  She is able to fully extend the left elbow, albeit with pain.  Neurological:     Mental Status: She is alert.       Assessment & Plan:   Problem List Items Addressed This Visit       Other   Left upper arm  pain - Primary    Recent fall on 8/22, landing on her left upper arm.  She has reduced range of motion and tenderness palpation over the lateral aspect of the left shoulder and upper arm.  She states that her range of motion is gradually improving, however it is still significantly reduced.  I have relatively low concern for fracture, however still feel that this should be ruled out with imaging.  Concern for developing rotator cuff pathology given reduced ROM, location of pain, and lack of recent use. -X-rays of the left humerus and shoulder have been ordered today -We discussed conservative treatment measures.  She can continue use Tylenol for pain relief.  I have recommended use of Voltaren gel and alternating heating/icing as well. -I have also provided her with home PT exercises to help improve the range of motion in her left shoulder in the absence of fracture. -She has follow-up scheduled for next  month with Dr. Moshe Cipro.      Relevant Orders   DG Humerus Left   DG Shoulder Left    Return if symptoms worsen or fail to improve.  Jennifer Abraham, MD

## 2021-11-19 NOTE — Patient Instructions (Signed)
It was a pleasure to see you today.  Thank you for giving [place] the opportunity to be involved in your care.  Below is a brief recap of your visit and next steps.  We will plan to see you again on 9/29  Summary Continue using tylenol as needed for pain. You can also use voltaren gel. I have attached stretching exercises for your shoulder. You can try alternating heating / icing your arm as well.  Next steps I have ordered xrays of your arm / shoulder to rule out a fracture You have follow up scheduled with Dr. Moshe Cipro for next month. Please let us know if your pain is not improving.

## 2021-11-19 NOTE — Assessment & Plan Note (Signed)
Recent fall on 8/22, landing on her left upper arm.  She has reduced range of motion and tenderness palpation over the lateral aspect of the left shoulder and upper arm.  She states that her range of motion is gradually improving, however it is still significantly reduced.  I have relatively low concern for fracture, however still feel that this should be ruled out with imaging.  Concern for developing rotator cuff pathology given reduced ROM, location of pain, and lack of recent use. -X-rays of the left humerus and shoulder have been ordered today -We discussed conservative treatment measures.  She can continue use Tylenol for pain relief.  I have recommended use of Voltaren gel and alternating heating/icing as well. -I have also provided her with home PT exercises to help improve the range of motion in her left shoulder in the absence of fracture. -She has follow-up scheduled for next month with Dr. Moshe Cipro.

## 2021-12-01 ENCOUNTER — Encounter: Payer: Self-pay | Admitting: Family Medicine

## 2021-12-01 ENCOUNTER — Ambulatory Visit (INDEPENDENT_AMBULATORY_CARE_PROVIDER_SITE_OTHER): Payer: Medicare HMO | Admitting: Family Medicine

## 2021-12-01 VITALS — BP 110/69 | HR 75 | Ht 65.0 in | Wt 176.0 lb

## 2021-12-01 DIAGNOSIS — M4716 Other spondylosis with myelopathy, lumbar region: Secondary | ICD-10-CM | POA: Diagnosis not present

## 2021-12-01 DIAGNOSIS — M4722 Other spondylosis with radiculopathy, cervical region: Secondary | ICD-10-CM

## 2021-12-01 DIAGNOSIS — W19XXXD Unspecified fall, subsequent encounter: Secondary | ICD-10-CM | POA: Diagnosis not present

## 2021-12-01 DIAGNOSIS — W19XXXA Unspecified fall, initial encounter: Secondary | ICD-10-CM | POA: Insufficient documentation

## 2021-12-01 MED ORDER — PREDNISONE 10 MG PO TABS: 10.0000 mg | ORAL_TABLET | Freq: Two times a day (BID) | ORAL | 0 refills | Status: DC

## 2021-12-01 MED ORDER — GABAPENTIN 100 MG PO CAPS: ORAL_CAPSULE | ORAL | 0 refills | Status: DC

## 2021-12-01 NOTE — Assessment & Plan Note (Signed)
New LUE and LLE tingling , pain , has established disease in C spine, has had surgey ,a dn also L spine Short course prednisone and higher bedtime dose of gabapentin short term Will need imaging and referral to painmx is not resolved in2 to 4 weeks

## 2021-12-01 NOTE — Assessment & Plan Note (Signed)
Current flare of pain and tingling  S/p fall, prednisone and gabapentin dose increase

## 2021-12-01 NOTE — Progress Notes (Signed)
   KASHAY CAVENAUGH     MRN: [id number]      DOB: [birth year]   HPI Ms. Dimaano is here  following fall onn 8/28/ slipped on a carpet in her home, shoe got caught, fell forward  , bruised both knees , shoulders , elbows, left shoulder pain and debility has lessened , however npow c/o left upper arm and forearm pain with tingling, disturbs and prevents her sleep, also having LLE ain and tingling She has established arthritis in c spine and lumbar spine Denies new weakness , numbness or incontinence  ROS Denies recent fever or chills. Denies sinus pressure, nasal congestion, ear pain or sore throat. Denies chest congestion, productive cough or wheezing. Denies chest pains, palpitations and leg swelling Denies abdominal pain, nausea, vomiting,diarrhea or constipation.   Denies dysuria, frequency, hesitancy or incontinence.  Denies depression, anxiety  Denies skin break down or rash.   PE  BP 110/69 (BP Location: Right Arm, Patient Position: Sitting, Cuff Size: Normal)   Pulse 75   Ht '5\' 5"'$  (1.651 m)   Wt 176 lb 0.6 oz (79.9 kg)   LMP 11/11/2016   SpO2 96%   BMI 29.29 kg/m   Patient alert and oriented and in no cardiopulmonary distress.  HEENT: No facial asymmetry, EOMI,     Neck decreased ROM .  Chest: Clear to auscultation bilaterally.  CVS: S1, S2 no murmurs, no S3.Regular rate.  ABD: Soft non tender.   Ext: No edema  MS: Decreased ROM lumbar spine, decreased in left shoulder, hips and knees.  Skin: Intact, no ulcerations or rash noted.  Psych: Good eye contact, normal affect. Memory intact not anxious or depressed appearing.  CNS: CN 2-12 intact, power,  normal throughout.no focal deficits noted.   Assessment & Plan  Fall New LUE and LLE tingling , pain , has established disease in C spine, has had surgey ,a dn also L spine Short course prednisone and higher bedtime dose of gabapentin short term Will need imaging and referral to painmx is not resolved in2 to 4  weeks  Cervical spondylosis with radiculopathy Following trauma , new LUE pain and tingling, short prednisone course and inc gabapentin and then review  Lumbar spondylosis with myelopathy Current flare of pain and tingling  S/p fall, prednisone and gabapentin dose increase

## 2021-12-01 NOTE — Assessment & Plan Note (Signed)
Following trauma , new LUE pain and tingling, short prednisone course and inc gabapentin and then review

## 2021-12-01 NOTE — Patient Instructions (Addendum)
F/U end October, flu vaccine at visit, call if symptoms persist or worsen   5 day course of prednisone and increase gabapentin to 2 at bedtime for next 10 days  Left upper and lower pain is from arthritis and disc problems in your neck and lower back that have been aggravated by your fall  Please stop wearing those shoes since your feet feel insecure when you wear them  Thanks for choosing  [facility], we consider it a privelige to serve you.

## 2021-12-04 ENCOUNTER — Other Ambulatory Visit: Payer: Self-pay | Admitting: Internal Medicine

## 2021-12-04 DIAGNOSIS — C73 Malignant neoplasm of thyroid gland: Secondary | ICD-10-CM

## 2021-12-08 ENCOUNTER — Encounter: Payer: Self-pay | Admitting: Internal Medicine

## 2021-12-15 ENCOUNTER — Telehealth: Payer: Self-pay

## 2021-12-15 NOTE — Telephone Encounter (Signed)
Pt contacted and advised patient assistance Ozempic (4 boxes) Novolog (4 boxes) and Pen needles (4 boxes) have been delivered and are ready for pick up.

## 2021-12-16 ENCOUNTER — Encounter: Payer: Medicare HMO | Admitting: Family Medicine

## 2021-12-16 ENCOUNTER — Ambulatory Visit (INDEPENDENT_AMBULATORY_CARE_PROVIDER_SITE_OTHER): Payer: Medicare HMO | Admitting: Family Medicine

## 2021-12-16 ENCOUNTER — Encounter: Payer: Self-pay | Admitting: Family Medicine

## 2021-12-16 ENCOUNTER — Ambulatory Visit (HOSPITAL_COMMUNITY)
Admission: RE | Admit: 2021-12-16 | Discharge: 2021-12-16 | Disposition: A | Discharge status: Home / Self Care | Payer: Medicare HMO | Source: Ambulatory Visit | Attending: Family Medicine | Admitting: Family Medicine

## 2021-12-16 VITALS — BP 121/75 | HR 103 | Ht 65.0 in | Wt 173.0 lb

## 2021-12-16 DIAGNOSIS — M542 Cervicalgia: Secondary | ICD-10-CM | POA: Diagnosis not present

## 2021-12-16 DIAGNOSIS — Z9889 Other specified postprocedural states: Secondary | ICD-10-CM

## 2021-12-16 DIAGNOSIS — M25562 Pain in left knee: Secondary | ICD-10-CM | POA: Diagnosis not present

## 2021-12-16 DIAGNOSIS — Z23 Encounter for immunization: Secondary | ICD-10-CM

## 2021-12-16 DIAGNOSIS — W19XXXD Unspecified fall, subsequent encounter: Secondary | ICD-10-CM

## 2021-12-16 DIAGNOSIS — M5410 Radiculopathy, site unspecified: Secondary | ICD-10-CM | POA: Diagnosis not present

## 2021-12-16 MED ORDER — GABAPENTIN 100 MG PO CAPS: ORAL_CAPSULE | ORAL | 1 refills | Status: DC

## 2021-12-16 NOTE — Assessment & Plan Note (Signed)
Progressively worsened since fall needs mRI and neurosurg eval after

## 2021-12-16 NOTE — Assessment & Plan Note (Addendum)
Persistent and worsening left extremity pain, image cervical and lumbar spine and neurosurgery eval, increase gabapentin for pain management

## 2021-12-16 NOTE — Patient Instructions (Addendum)
F/U in December, , call if you need me sooner  Flu vaccine today  You are referred for MRI of neck and lower back, and will also be referred to Neurosurgeon for evaluation  Increase gabapentin to 100 mg one twice daily and two to three at bedtime  due to uncontrolled pain disturbing your sleep and function  X ray of left knee today  Thanks for choosing [facility], we consider it a privelige to serve you.

## 2021-12-16 NOTE — Assessment & Plan Note (Signed)
Reports pain and difficulty moving knee following fall x ray of knee, pain likely radiating from spine

## 2021-12-16 NOTE — Assessment & Plan Note (Signed)
3 week h/o perisitent and worsening neck and LUE pain following fall on 8/23, ipodate MRI and neurosurg re eval

## 2021-12-16 NOTE — Progress Notes (Signed)
   Jennifer Blackburn     MRN: [id number]      DOB: 1945-11-30   HPI Ms. Groseclose is here wih persistent debilitating left upper and lower extremity pain , and reduced mobility of left leg, decreased ability to stand due to painf ollowing her fall on 11/10/2021 Symptoms have persisted and worsened. She is s/p C spine surgery and has known arthritis and disc disease in lumbar spine Needs imaging and  neurosurgery eval due to persistence of and worsening symptoms Is lying down more due to pain and has to left left knee due to weakness and pain ROS Denies recent fever or chills. Denies sinus pressure, nasal congestion, ear pain or sore throat. Denies chest congestion, productive cough or wheezing. Denies chest pains, palpitations and leg swelling Denies abdominal pain, nausea, vomiting,diarrhea or constipation.   Denies dysuria, frequency, hesitancy or incontinence. . Denies depression, anxiety or insomnia. Denies skin break down or rash.   PE  BP 121/75 (BP Location: Right Arm, Patient Position: Sitting, Cuff Size: Large)   Pulse (!) 103   Ht '5\' 5"'$  (1.651 m)   Wt 173 lb (78.5 kg)   LMP 11/11/2016   SpO2 97%   BMI 28.79 kg/m   Patient alert and oriented and in no cardiopulmonary distress.Pt in pain  HEENT: No facial asymmetry, EOMI,     Neck decreased ROM aiht left spasm .  Chest: Clear to auscultation bilaterally.  CVS: S1, S2 no murmurs, no S3.Regular rate.  ABD: Soft non tender.   Ext: No edema  MS: decreased ROM lumbar spine, left knee and left shoulder Skin: Intact, no ulcerations or rash noted.  Psych: Good eye contact, normal affect. Memory intact not anxious or depressed appearing.  CNS: CN 2-12 intact, grade 4 power in LLE and LUE  Assessment & Plan  Back pain with radiculopathy Progressively worsened since fall needs mRI and neurosurg eval after  Neck pain with history of cervical spinal surgery 3 week h/o perisitent and worsening neck and LUE pain following  fall on 8/23, ipodate MRI and neurosurg re eval  Acute pain of left knee Reports pain and difficulty moving knee following fall x ray of knee, pain likely radiating from spine  Fall Persistent and worsening left extremity pain, image cervical and lumbar spine and neurosurgery eval, increase gabapentin for pain management

## 2021-12-18 ENCOUNTER — Ambulatory Visit: Payer: Medicare HMO | Admitting: Family Medicine

## 2021-12-19 ENCOUNTER — Other Ambulatory Visit: Payer: Self-pay | Admitting: Internal Medicine

## 2021-12-19 DIAGNOSIS — C73 Malignant neoplasm of thyroid gland: Secondary | ICD-10-CM

## 2021-12-31 ENCOUNTER — Encounter: Payer: Self-pay | Admitting: Internal Medicine

## 2021-12-31 ENCOUNTER — Ambulatory Visit: Payer: Medicare HMO | Admitting: Internal Medicine

## 2021-12-31 VITALS — BP 130/78 | HR 89 | Ht 65.0 in | Wt 175.2 lb

## 2021-12-31 DIAGNOSIS — E890 Postprocedural hypothyroidism: Secondary | ICD-10-CM

## 2021-12-31 DIAGNOSIS — C73 Malignant neoplasm of thyroid gland: Secondary | ICD-10-CM | POA: Diagnosis not present

## 2021-12-31 DIAGNOSIS — E1159 Type 2 diabetes mellitus with other circulatory complications: Secondary | ICD-10-CM

## 2021-12-31 LAB — POCT GLYCOSYLATED HEMOGLOBIN (HGB A1C): Hemoglobin A1C: 9.1 % — AB (ref 4.0–5.6)

## 2021-12-31 LAB — TSH: TSH: 0.72 u[IU]/mL (ref 0.35–5.50)

## 2021-12-31 LAB — T4, FREE: Free T4: 1.1 ng/dL (ref 0.60–1.60)

## 2021-12-31 NOTE — Patient Instructions (Addendum)
Please continue: - Metformin 1000 mg 2x a day with meals. - Toujeo 30 units in a.m. and 40 units at bedtime - Novolog 10-15 units before each meal  - Ozempic 2 mg weekly  Try to stop eating after dinner. NO eating after dark.  Please schedule an appt with Antonieta Iba with nutrition. [phone].  Please continue levothyroxine 125 mcg daily.  Take the thyroid hormone every day, with water, at least 30 minutes before breakfast, separated by at least 4 hours from: - acid reflux medications - calcium - iron - multivitamins  Please return in 3-4 months with your sugar log.

## 2021-12-31 NOTE — Progress Notes (Signed)
Patient ID: Jennifer Blackburn, female   DOB: Oct 24, 1945, 76 y.o.   MRN: [id number]   HPI: Jennifer Blackburn is a 76 y.o.-year-old female, returning for follow-up for DM2, dx in 2001, insulin-dependent since 2017, uncontrolled, with long-term complications (DR, CAD) and also papillary thyroid cancer and postsurgical hypothyroidism.  Last visit 4 months ago.  Interim history: No increased urination, blurry vision, nausea, chest pain. She fell 3 weeks ago >> was on Prednisone.  DM2: Reviewed HbA1c levels: Lab Results  Component Value Date   HGBA1C 8.8 (A) 08/27/2021   HGBA1C 8.8 (A) 04/28/2021   HGBA1C 10.1 (A) 12/23/2020   HGBA1C 9.0 (A) 08/07/2020   HGBA1C 10.5 (A) 05/09/2020   HGBA1C 9.7 (A) 01/04/2020   HGBA1C 8.4 (A) 08/29/2019   HGBA1C 9.3 (A) 05/03/2019   HGBA1C 8.8 (A) 12/28/2018   HGBA1C 8.9 (A) 09/21/2018  01/12/2018: HbA1c 8.7% 08/05/2017: HbA1c 8.9% 07/11/2017: HbA1c 9.1%  Pt is on a regimen of: - Metformin 1000 mg 2x a day with meals. - Toujeo 30 units in a.m. and 50 units at bedtime >> 30 units in a.m. and 40 units at bedtime - Lispro 8-10 >> 10 mg 3x a day 15 min before dinner >> NovoLog 10 -15 units before/after each meal >> not consistently - Ozempic 0.5 >> off >> 1 >> 2 mg weekly (through the patient assistance program)  She was on Ozempic 0.5 mg weekly in a.m. >> 1 mg  - constipation >> stopped 06/2018.  We added it back 09/2018. She was on Victoza >> $$$. She was on Jardiance >> $$$. She was on Actos >> swelling - stopped Spring 2019. She was on Glipizide XL 5 mg daily >> stopped 07/2017.  We retried glipizide 5 mg twice a day but had to stop 12/2018 due to lack of effect.  Pt checks her sugars 1-2 times a day: - am: 180-200 >> 120-130 >> 96, 110-160 >> 120-201 - 2h after b'fast: n/c - before lunch: n/c - 2h after lunch: 265-300s >> n/c >> 200s >> 200s >> n/c >> 130-180 - before dinner: n/c >> 185-190 >> n/c - 2h after dinner: n/c >> 150-188 - bedtime:  160-180  >> 170-180s >> 160s >> 140-210 >> n/c - nighttime: n/c Lowest sugar was 170 >> 86 >>90s; she has hypoglycemia awareness in the 60s. Highest sugar was 200 >> 180s >> 200s.  Glucometer: AccuChek Aviva  Pt's meals are: - Breakfast:toast, cereal, milk; oatmeal; bacon + egg >> oatmeal + coffee >> cereals - Lunch: skips >> 2 eggs, fruit - Dinner: hamburger, or chicken, or fish, + veggies, bread, water and tea - Snacks: 2- fruit She was seeing a dietitian in [place]. She walks for exercise.  -No CKD, last BUN/creatinine:  Lab Results  Component Value Date   BUN 14 04/21/2021   BUN 15 01/28/2021   CREATININE 0.82 04/21/2021   CREATININE 0.92 01/28/2021  On quinapril.  -+ HL; last set of lipids: Lab Results  Component Value Date   CHOL 126 04/21/2021   HDL 37 (L) 04/21/2021   LDLCALC 70 04/21/2021   TRIG 103 04/21/2021   CHOLHDL 3.4 04/21/2021  On pravastatin 80.  - last eye exam was in 10/2020: No DR . Dr. Eulas Post in [place].  She has cataracts OU.   -No numbness and tingling in her feet. Has a podiatrist.  Last foot exam 01/21/2021.  On ASA 81.  Pt has no FH of DM.  H/o Thyroid cancer (2001), s/p RAI treatment,  now with postsurgical hypothyroidism.  Previously on brand-name Synthroid, now generic levothyroxine.  Pt is on levothyroxine 125 mcg daily (dose decreased 08/2021), taken: - in am - fasting - at least 30 min from b'fast - no calcium - no iron - no multivitamins - + PPIs (Protonix) moved to pm - not on Biotin  Reviewed her TFTs: Lab Results  Component Value Date   TSH 0.06 (L) 08/27/2021   TSH 0.40 08/07/2020   TSH 1.60 05/09/2020   TSH 0.05 (L) 08/31/2019   TSH 0.04 (L) 12/28/2018   TSH 0.06 (L) 09/21/2018   TSH 0.61 05/26/2018   TSH 2.49 03/23/2016   TSH 0.070 (L) 10/13/2010   TSH 0.035 (L) 09/22/2006   TSH 0.015 (L) 06/17/2006  11/03/2017: TSH 0.044  Her thyroglobulin levels are detectable: Lab Results  Component Value Date   THYROGLB  0.3 (L) 08/27/2021   THYROGLB 0.4 (L) 08/07/2020   THYROGLB 1.2 (L) 05/09/2020   THYROGLB 0.4 (L) 08/31/2019   THYROGLB 0.3 (L) 09/21/2018   THYROGLB 0.7 (L) 05/26/2018   Lab Results  Component Value Date   THGAB <1 08/27/2021   THGAB <1 08/07/2020   THGAB <1 05/09/2020   THGAB <1 08/31/2019   THGAB <1 09/21/2018   THGAB <1 05/26/2018   Reviewed records from Dr. Ronnald Collum: Patient is status post total thyroidectomy in 2004 1.3 cm papillary thyroid cancer of the right lobe, followed by 3x RAI tx's with  33 mCi I-131 and a fourth RAI dose inpatient: 125 mCi, at [facility].    Whole-body scan on 09/08/2007 showed an increase in the thyroid bed activity and the follow up study with PET scan 10/08/2007 showed mildly hypermetabolic cervical lymph nodes.    Repeat PET 05/10/2008 showed no significant changes felt likely to be due to to a reactive process.  11/03/2017: TSH 0.044, thyroglobulin 0.2, ATA <1.0  04/05/2017: TSH 0.021 (0.45-4.5), thyroglobulin 0.3 (by IMA), ATA <1.0  Reviewed imaging test reports in the system: Thyroid ultrasound (06/10/2018): 1.1 cm nonspecific calcified lesion in the right upper neck.  CT was recommended: There is no residual or recurrent tissue in the right or left thyroid beds. There is no evidence of abnormal adenopathy by short axis diameter measurement criteria There is a nonspecific calcified soft tissue mass measuring 1.1 cm in the right superior neck. This is of unknown significance. CT neck can be performed to further characterize.  CT (10/18/2018): Status post thyroidectomy. No soft tissue mass within the thyroidectomy bed. A 13 x 7 mm ovoid focus of calcification in the right aspect of the thyroidectomy bed likely corresponding with the finding on recent neck ultrasound and is unchanged as compared to neck CT 10/24/2007, benign.   No pathologically enlarged cervical chain lymph nodes. A nonenlarged calcified right supraclavicular lymph node is new from  prior neck CT 10/24/2007 but also favored benign. Attention recommended on follow-up.  Neck U/S (06/03/2020): Ultrasound of the neck demonstrates no evidence of visible residual thyroid tissue or abnormal soft tissue mass.   Ovoid area of densely shadowing calcification again noted to the right of midline as seen by prior ultrasound and also demonstrated by prior CT studies of the neck with documented stability between 2009 and 2020 studies.   Small left cervical lymph node has a similar appearance to the prior ultrasound study measuring 0.5 cm in short axis.   IMPRESSION: No evidence by ultrasound of recurrent thyroid malignancy in the neck.  Stable right neck calcification which has been documented to  be benign and stable by prior CT.  Small left cervical lymph nodedemonstrates stable appearance and size by ultrasound  compared to the 2020 ultrasound.  Pt denies: - feeling nodules in neck - hoarseness - dysphagia - choking  She also has a history of HTN and anemia.  ROS: + See HPI Musculoskeletal: + muscle aches/+ joint aches  I reviewed pt's medications, allergies, PMH, social hx, family hx, and changes were documented in the history of present illness. Otherwise, unchanged from my initial visit note.  Past Medical History:  Diagnosis Date   Anxiety    Arthritis    CAD (coronary artery disease)    Depression    Diabetes mellitus type II    without complication   DJD (degenerative joint disease) of lumbar spine    Hypercholesteremia    Hyperlipidemia    Hypertension    benign    Hypothyroidism    Thyroid cancer ([place]) 2001   Past Surgical History:  Procedure Laterality Date   ABDOMINAL HYSTERECTOMY     CARDIAC CATHETERIZATION     CATARACT EXTRACTION W/PHACO Right 07/17/2012   Procedure: CATARACT EXTRACTION PHACO AND INTRAOCULAR LENS PLACEMENT ([place]);  Surgeon: Tonny Branch, MD;  Location: [facility];  Service: Ophthalmology;  Laterality: Right;  CDE:25.51   CATARACT  EXTRACTION W/PHACO Left 09/17/2016   Procedure: CATARACT EXTRACTION PHACO AND INTRAOCULAR LENS PLACEMENT LEFT EYE;  Surgeon: Tonny Branch, MD;  Location: [facility];  Service: Ophthalmology;  Laterality: Left;  CDE: 19.23   COLONOSCOPY     COLONOSCOPY N/A 01/15/2014   Procedure: COLONOSCOPY;  Surgeon: Daneil Dolin, MD;  Location: [facility] SUITE;  Service: Endoscopy;  Laterality: N/A;  9:00 AM   COLONOSCOPY WITH PROPOFOL N/A 01/25/2019   Procedure: COLONOSCOPY WITH PROPOFOL;  Surgeon: Daneil Dolin, MD; diverticulosis in the entire colon, otherwise normal exam.  No repeat colonoscopy due to age.   DOPPLER ECHOCARDIOGRAPHY     ESOPHAGOGASTRODUODENOSCOPY (EGD) WITH PROPOFOL N/A 01/25/2019   Procedure: ESOPHAGOGASTRODUODENOSCOPY (EGD) WITH PROPOFOL;  Surgeon: Daneil Dolin, MD; normal esophagus without dilation due to inability to pass dilator beyond the hypopharynx, 1 small nonbleeding gastric ulcer s/p biopsied, otherwise normal exam.  Pathology with ulcer with reactive changes, no H. pylori, metaplasia, dysplasia, or malignancy.   ESOPHAGOGASTRODUODENOSCOPY (EGD) WITH PROPOFOL N/A 08/21/2019   Procedure: ESOPHAGOGASTRODUODENOSCOPY (EGD) WITH PROPOFOL;  Surgeon: Daneil Dolin, MD; Normal esophagus. Previously noted gastric ulcer completely healed. Normal examined duodenum.    JOINT REPLACEMENT  07/01/2010   left hip   KNEE SURGERY Right    arthroscopy   left hip replaced  07/01/2010   SPINE SURGERY  2006   cervical   stress dipyridamole myocardial perfusion     THYROIDECTOMY     TONSILLECTOMY     VESICOVAGINAL FISTULA CLOSURE W/ TAH     Social History   Socioeconomic History   Marital status: Married    Spouse name: Not on file   Number of children: 2   Years of education: 14   Highest education level: Not on file  Occupational History   Occupation: retired     Comment: bank  Tobacco Use   Smoking status: Never    Passive exposure: Yes   Smokeless tobacco: Never   Tobacco comments:     Husband smokes in the home  Vaping Use   Vaping Use: Never used  Substance and Sexual Activity   Alcohol use: No   Drug use: No   Sexual activity: Never  Other Topics  Concern   Not on file  Social History Narrative   Lives with husband, at home   No caffeine   Social Determinants of Health   Financial Resource Strain: Low Risk  (08/31/2021)   Overall Financial Resource Strain (CARDIA)    Difficulty of Paying Living Expenses: Not hard at all  Food Insecurity: No Food Insecurity (08/31/2021)   Hunger Vital Sign    Worried About Running Out of Food in the Last Year: Never true    Ran Out of Food in the Last Year: Never true  Transportation Needs: No Transportation Needs (08/31/2021)   PRAPARE - Hydrologist (Medical): No    Lack of Transportation (Non-Medical): No  Physical Activity: Insufficiently Active (08/31/2021)   Exercise Vital Sign    Days of Exercise per Week: 7 days    Minutes of Exercise per Session: 10 min  Stress: No Stress Concern Present (08/31/2021)   [place]    Feeling of Stress : Not at all  Social Connections: [place] (08/31/2021)   Social Connection and Isolation Panel [NHANES]    Frequency of Communication with Friends and Family: More than three times a week    Frequency of Social Gatherings with Friends and Family: More than three times a week    Attends Religious Services: More than 4 times per year    Active Member of [employer] or Organizations: Yes    Attends Archivist Meetings: 1 to 4 times per year    Marital Status: Married  Human resources officer Violence: Not At Risk (08/31/2021)   Humiliation, Afraid, Rape, and Kick questionnaire    Fear of Current or Ex-Partner: No    Emotionally Abused: No    Physically Abused: No    Sexually Abused: No   Current Outpatient Medications  Medication Sig Dispense Refill   Cetirizine HCl 10 MG TBDP Take  one tablet by mouth two times daily for allergy (Patient taking differently: Take 1 tablet by mouth daily.) 60 tablet 5   Cholecalciferol (VITAMIN D3) 50 MCG (2000 UT) TABS Take 2,000 Units by mouth daily.     gabapentin (NEURONTIN) 100 MG capsule Take one capsule twice daily and two at bedtime for 10 days 40 capsule 0   gabapentin (NEURONTIN) 100 MG capsule Take one capsule twice daily and three at bedtime 150 capsule 1   glucose blood (ACCU-CHEK GUIDE) test strip Use as instructed to check blood sugar 2 times per day dx code E11.65 100 each 3   hydrOXYzine (ATARAX) 10 MG tablet Take one tablet twice daily and two tablets at bedtime as needed for generalized itching. 360 tablet 1   imipramine (TOFRANIL) 50 MG tablet TAKE 1 TABLET AT BEDTIME 90 tablet 2   insulin aspart (NOVOLOG FLEXPEN) 100 UNIT/ML FlexPen Inject 10-15 Units into the skin 3 (three) times daily with meals. (Patient taking differently: Inject 15 Units into the skin 3 (three) times daily with meals.) 15 mL 2   Insulin Pen Needle 32G X 4 MM MISC Use 4x a day 300 each 3   isosorbide mononitrate (IMDUR) 30 MG 24 hr tablet TAKE HALF A TABLET BY MOUTH EVERY DAY 45 tablet 3   Lancets (ACCU-CHEK MULTICLIX) lancets Use as instructed three times daily dx 250.01 100 each 5   levothyroxine (SYNTHROID) 125 MCG tablet TAKE 1 TABLET BY MOUTH EVERY DAY BEFORE BREAKFAST 90 tablet 1   levothyroxine (SYNTHROID) 137 MCG tablet TAKE 1  TABLET EVERY DAY BEFORE BREAKFAST 90 tablet 1   metFORMIN (GLUCOPHAGE) 1000 MG tablet TAKE 1 TABLET TWICE DAILY 180 tablet 1   metoprolol tartrate (LOPRESSOR) 50 MG tablet TAKE 1 TABLET BY MOUTH TWICE A DAY (Patient taking differently: Take 25 mg by mouth in the morning and at bedtime.) 180 tablet 3   pantoprazole (PROTONIX) 40 MG tablet Take 1 tablet (40 mg total) by mouth daily. 30 tablet 11   PARoxetine (PAXIL) 20 MG tablet TAKE 1 TABLET EVERY DAY 90 tablet 1   pravastatin (PRAVACHOL) 80 MG tablet TAKE 1 TABLET EVERY DAY  90 tablet 1   Semaglutide, 1 MG/DOSE, (OZEMPIC, 1 MG/DOSE,) 4 MG/3ML SOPN Inject 1 mg into the skin once a week. (Patient taking differently: Inject 2 mg into the skin once a week.) 9 mL 3   TOUJEO SOLOSTAR 300 UNIT/ML Solostar Pen INJECT 30-50 UNITS TWO TIMES DAILY 10.5 mL 1   vitamin B-12 (CYANOCOBALAMIN) 1000 MCG tablet Take 1 tablet (1,000 mcg total) by mouth daily. 30 tablet 5   No current facility-administered medications for this visit.     Allergies  Allergen Reactions   Lipitor [Atorvastatin Calcium] Other (See Comments)    Muscle aches   Actos [Pioglitazone] Other (See Comments)    Peripheral edema   Crestor [Rosuvastatin] Other (See Comments)    Muscle aches   Daypro [Oxaprozin] Hives   Nsaids Hives   Sulfonamide Derivatives Hives   Family History  Problem Relation Age of Onset   Heart attack Father    Heart failure Mother    Asthma Daughter    Sleep apnea Son        CPAP   Colon cancer Neg Hx    PE: BP 130/78 (BP Location: Right Arm, Patient Position: Sitting, Cuff Size: Normal)   Pulse 89   Ht '5\' 5"'$  (1.651 m)   Wt 175 lb 3.2 oz (79.5 kg)   LMP 11/11/2016   SpO2 95%   BMI 29.15 kg/m  Wt Readings from Last 3 Encounters:  12/31/21 175 lb 3.2 oz (79.5 kg)  12/16/21 173 lb (78.5 kg)  12/01/21 176 lb 0.6 oz (79.9 kg)   Constitutional: overweight, in NAD Eyes: no exophthalmos ENT: no neck masses palpated, no cervical lymphadenopathy Cardiovascular: RRR, No MRG Respiratory: CTA B Musculoskeletal: no deformities Skin: moist, warm, no rashes Neurological: no tremor with outstretched hands Diabetic Foot Exam - Simple   Simple Foot Form Diabetic Foot exam was performed with the following findings: Yes 12/31/2021  1:25 PM  Visual Inspection No deformities, no ulcerations, no other skin breakdown bilaterally: Yes Sensation Testing Intact to touch and monofilament testing bilaterally: Yes Pulse Check Posterior Tibialis and Dorsalis pulse intact bilaterally:  Yes Comments     ASSESSMENT: 1. DM2, insulin-dependent, uncontrolled, with long-term complications - CAD - DR  2.  Papillary thyroid cancer  3.  Postsurgical hypothyroidism  PLAN:  1. Patient with longstanding, uncontrolled, type 2 diabetes, on metformin, basal-bolus insulin regimen and weekly GLP-1 receptor agonist, with still  poor control.  At last visit, sugars are at or above target in the morning and also at bedtime.  She was not taking during the day.  She was eating meals out and not always taking her NovoLog with her.  We discussed about the potential use of an insulin patch pump but she tried it last year and this was not approved by her insurance.  At last visit, therefore, we increased the dose of Ozempic.  She did have  constipation for which she was using MiraLAX and prune juice.  We discussed that prune juice can increase blood sugars.  HbA1c at last visit was 8.8%, stable. - at today's visit, sugars are above target in the morning but in the rest of the day, they are not that high.  At today's visit, I will check a fructosamine level to verify her directly measured HbA1c.  In the meantime, we discussed about not missing NovoLog injections.  She does mention that she misses many of them.  Also, I advised her to stop eating fruit at night, after dinner, since it appears that her sugars are fairly well controlled during the day and after dinner, but they are higher in the morning.  We will continue the rest of the regimen.  She tolerates the higher dose of Ozempic well. - I suggested to:  Patient Instructions  Please continue: - Metformin 1000 mg 2x a day with meals. - Toujeo 30 units in a.m. and 40 units at bedtime - Novolog 10-15 units before each meal  - Ozempic 2 mg weekly  Try to stop eating after dinner. NO eating after dark.  Please schedule an appt with Antonieta Iba with nutrition. [phone].  Please continue levothyroxine 125 mcg daily.  Take the thyroid hormone  every day, with water, at least 30 minutes before breakfast, separated by at least 4 hours from: - acid reflux medications - calcium - iron - multivitamins  Please return in 3-4 months with your sugar log.  - we checked her HbA1c: 9.1% (higher) - advised to check sugars at different times of the day - 4x a day, rotating check times - advised for yearly eye exams >> she is UTD - return to clinic in 3-4 months   2.  Papillary thyroid cancer -Previous thyroid cancer records were reviewed from Dr. Ronnald Collum -She has metastatic thyroid cancer and had 4 RAI treatments.  She also had increased signal on the PET scan from 2009, however, in 2010, another PET scan showed possible inflammatory lymph nodes in the area only -Her thyroglobulin levels were detectable but stable in the past.  She had a high level in 04/2020, but we repeated the levels and they returned to baseline -On the latest neck ultrasound from 05/2020, there were no new concerning masses.  Previous neck ultrasound also showed a specific calcified region in the right upper neck and a CT scan of the neck in 2019 showed that the calcified mass was stable, most likely benign. -At last visit, we checked her thyroglobulin, which was detectable but decreasing and ATA antibodies were undetectable. -We will recheck her thyroglobulin + ATA at next visit  3.  Postsurgical hypothyroidism - latest thyroid labs reviewed with pt. >>  TSH was low: Lab Results  Component Value Date   TSH 0.06 (L) 08/27/2021  - she continues on LT4 125 mcg daily, decreased after the above results returned - pt feels good on this dose. - we discussed about taking the thyroid hormone every day, with water, >30 minutes before breakfast, separated by >4 hours from acid reflux medications, calcium, iron, multivitamins. Pt. is taking it correctly. - will check thyroid tests today: TSH and fT4 - If labs are abnormal, she will need to return for repeat TFTs in 1.5  months  Component     Latest Ref Rng 12/31/2021  Hemoglobin A1C     4.0 - 5.6 % 9.1 !   TSH     0.35 - 5.50 uIU/mL 0.72  T4,Free(Direct)     0.60 - 1.60 ng/dL 1.10   Fructosamine     205 - 285 umol/L 345 (H)     TFTs are now normal. HbA1c calculated from fructosamine is 7.47%, lower than the directly measured HbA1c.  Philemon Kingdom, MD PhD [facility] Endocrinology

## 2022-01-04 ENCOUNTER — Other Ambulatory Visit: Payer: Self-pay | Admitting: Internal Medicine

## 2022-01-04 DIAGNOSIS — E1159 Type 2 diabetes mellitus with other circulatory complications: Secondary | ICD-10-CM

## 2022-01-05 LAB — FRUCTOSAMINE: Fructosamine: 345 umol/L — ABNORMAL HIGH (ref 205–285)

## 2022-01-08 ENCOUNTER — Ambulatory Visit (HOSPITAL_COMMUNITY)
Admission: RE | Admit: 2022-01-08 | Discharge: 2022-01-08 | Disposition: A | Discharge status: Home / Self Care | Payer: Medicare HMO | Source: Ambulatory Visit | Attending: Family Medicine | Admitting: Family Medicine

## 2022-01-08 DIAGNOSIS — M47816 Spondylosis without myelopathy or radiculopathy, lumbar region: Secondary | ICD-10-CM | POA: Diagnosis not present

## 2022-01-08 DIAGNOSIS — M5410 Radiculopathy, site unspecified: Secondary | ICD-10-CM

## 2022-01-08 DIAGNOSIS — M47812 Spondylosis without myelopathy or radiculopathy, cervical region: Secondary | ICD-10-CM | POA: Diagnosis not present

## 2022-01-08 DIAGNOSIS — M4316 Spondylolisthesis, lumbar region: Secondary | ICD-10-CM | POA: Diagnosis not present

## 2022-01-08 DIAGNOSIS — M545 Low back pain, unspecified: Secondary | ICD-10-CM | POA: Diagnosis not present

## 2022-01-08 DIAGNOSIS — M4802 Spinal stenosis, cervical region: Secondary | ICD-10-CM | POA: Diagnosis not present

## 2022-01-08 DIAGNOSIS — Z9889 Other specified postprocedural states: Secondary | ICD-10-CM | POA: Diagnosis not present

## 2022-01-08 DIAGNOSIS — M5023 Other cervical disc displacement, cervicothoracic region: Secondary | ICD-10-CM | POA: Diagnosis not present

## 2022-01-08 DIAGNOSIS — M50221 Other cervical disc displacement at C4-C5 level: Secondary | ICD-10-CM | POA: Diagnosis not present

## 2022-01-08 DIAGNOSIS — M542 Cervicalgia: Secondary | ICD-10-CM | POA: Insufficient documentation

## 2022-01-14 ENCOUNTER — Ambulatory Visit: Payer: Medicare HMO | Admitting: Family Medicine

## 2022-01-25 DIAGNOSIS — M5126 Other intervertebral disc displacement, lumbar region: Secondary | ICD-10-CM | POA: Diagnosis not present

## 2022-01-25 DIAGNOSIS — M25512 Pain in left shoulder: Secondary | ICD-10-CM | POA: Diagnosis not present

## 2022-02-09 DIAGNOSIS — M25512 Pain in left shoulder: Secondary | ICD-10-CM | POA: Diagnosis not present

## 2022-02-09 DIAGNOSIS — S46012A Strain of muscle(s) and tendon(s) of the rotator cuff of left shoulder, initial encounter: Secondary | ICD-10-CM | POA: Diagnosis not present

## 2022-02-15 ENCOUNTER — Ambulatory Visit (HOSPITAL_COMMUNITY)
Admission: RE | Admit: 2022-02-15 | Discharge: 2022-02-15 | Disposition: A | Discharge status: Home / Self Care | Payer: Medicare HMO | Source: Ambulatory Visit | Attending: Family Medicine | Admitting: Family Medicine

## 2022-02-15 DIAGNOSIS — Z1231 Encounter for screening mammogram for malignant neoplasm of breast: Secondary | ICD-10-CM | POA: Insufficient documentation

## 2022-03-02 DIAGNOSIS — M25512 Pain in left shoulder: Secondary | ICD-10-CM | POA: Diagnosis not present

## 2022-03-02 DIAGNOSIS — Z6829 Body mass index (BMI) 29.0-29.9, adult: Secondary | ICD-10-CM | POA: Diagnosis not present

## 2022-03-04 DIAGNOSIS — E559 Vitamin D deficiency, unspecified: Secondary | ICD-10-CM | POA: Diagnosis not present

## 2022-03-04 DIAGNOSIS — I10 Essential (primary) hypertension: Secondary | ICD-10-CM | POA: Diagnosis not present

## 2022-03-04 DIAGNOSIS — E782 Mixed hyperlipidemia: Secondary | ICD-10-CM | POA: Diagnosis not present

## 2022-03-04 DIAGNOSIS — D539 Nutritional anemia, unspecified: Secondary | ICD-10-CM | POA: Diagnosis not present

## 2022-03-05 ENCOUNTER — Encounter: Payer: Self-pay | Admitting: Family Medicine

## 2022-03-05 ENCOUNTER — Ambulatory Visit (INDEPENDENT_AMBULATORY_CARE_PROVIDER_SITE_OTHER): Payer: Medicare HMO | Admitting: Family Medicine

## 2022-03-05 VITALS — BP 115/69 | HR 80 | Ht 65.0 in | Wt 175.0 lb

## 2022-03-05 DIAGNOSIS — I2510 Atherosclerotic heart disease of native coronary artery without angina pectoris: Secondary | ICD-10-CM

## 2022-03-05 DIAGNOSIS — E782 Mixed hyperlipidemia: Secondary | ICD-10-CM

## 2022-03-05 DIAGNOSIS — I10 Essential (primary) hypertension: Secondary | ICD-10-CM

## 2022-03-05 DIAGNOSIS — E1159 Type 2 diabetes mellitus with other circulatory complications: Secondary | ICD-10-CM | POA: Diagnosis not present

## 2022-03-05 DIAGNOSIS — E890 Postprocedural hypothyroidism: Secondary | ICD-10-CM

## 2022-03-05 DIAGNOSIS — I2583 Coronary atherosclerosis due to lipid rich plaque: Secondary | ICD-10-CM

## 2022-03-05 LAB — CMP14+EGFR
ALT: 15 IU/L (ref 0–32)
AST: 20 IU/L (ref 0–40)
Albumin/Globulin Ratio: 1.6 (ref 1.2–2.2)
Albumin: 4.4 g/dL (ref 3.8–4.8)
Alkaline Phosphatase: 52 IU/L (ref 44–121)
BUN/Creatinine Ratio: 12 (ref 12–28)
BUN: 10 mg/dL (ref 8–27)
Bilirubin Total: 0.2 mg/dL (ref 0.0–1.2)
CO2: 26 mmol/L (ref 20–29)
Calcium: 9.3 mg/dL (ref 8.7–10.3)
Chloride: 97 mmol/L (ref 96–106)
Creatinine, Ser: 0.84 mg/dL (ref 0.57–1.00)
Globulin, Total: 2.8 g/dL (ref 1.5–4.5)
Glucose: 179 mg/dL — ABNORMAL HIGH (ref 70–99)
Potassium: 4.8 mmol/L (ref 3.5–5.2)
Sodium: 137 mmol/L (ref 134–144)
Total Protein: 7.2 g/dL (ref 6.0–8.5)
eGFR: 72 mL/min/{1.73_m2} (ref >59)

## 2022-03-05 LAB — VITAMIN B12: Vitamin B-12: 1513 pg/mL — ABNORMAL HIGH (ref 232–1245)

## 2022-03-05 LAB — LIPID PANEL
Chol/HDL Ratio: 3.5 ratio (ref 0.0–4.4)
Cholesterol, Total: 162 mg/dL (ref 100–199)
HDL: 46 mg/dL (ref >39)
LDL Chol Calc (NIH): 91 mg/dL (ref 0–99)
Triglycerides: 141 mg/dL (ref 0–149)
VLDL Cholesterol Cal: 25 mg/dL (ref 5–40)

## 2022-03-05 LAB — CBC
Hematocrit: 37.8 % (ref 34.0–46.6)
Hemoglobin: 11.6 g/dL (ref 11.1–15.9)
MCH: 26.1 pg — ABNORMAL LOW (ref 26.6–33.0)
MCHC: 30.7 g/dL — ABNORMAL LOW (ref 31.5–35.7)
MCV: 85 fL (ref 79–97)
Platelets: 277 10*3/uL (ref 150–450)
RBC: 4.44 x10E6/uL (ref 3.77–5.28)
RDW: 12.4 % (ref 11.7–15.4)
WBC: 7.2 10*3/uL (ref 3.4–10.8)

## 2022-03-05 LAB — VITAMIN D 25 HYDROXY (VIT D DEFICIENCY, FRACTURES): Vit D, 25-Hydroxy: 36.4 ng/mL (ref 30.0–100.0)

## 2022-03-05 NOTE — Patient Instructions (Addendum)
Annual  exam March when due, call if you need me sooner  If you send the name of Ortho you want to work on your left shoulder , let me know, I will refer you there  Stop B12  for 1 week , then start   3 times weekly  Please get Covid , then RSV , then TdAP , then shingrix vaccines  Cholesterol , luiver and kidney function are excellent, also BP is excellent  Thanks for choosing [facility], we consider it a privelige to serve you.

## 2022-03-07 ENCOUNTER — Encounter: Payer: Self-pay | Admitting: Family Medicine

## 2022-03-07 NOTE — Assessment & Plan Note (Signed)
Mnaged by Endo adequately controlled on current medication

## 2022-03-07 NOTE — Assessment & Plan Note (Signed)
Hyperlipidemia:Low fat diet discussed and encouraged.   Lipid Panel  Lab Results  Component Value Date   CHOL 162 03/04/2022   HDL 46 03/04/2022   LDLCALC 91 03/04/2022   TRIG 141 03/04/2022   CHOLHDL 3.5 03/04/2022     Controlled, no change in medication

## 2022-03-07 NOTE — Progress Notes (Signed)
Jennifer Blackburn     MRN: [id number]      DOB: 05/23/1945   HPI Jennifer Blackburn is here for follow up and re-evaluation of chronic medical conditions, medication management and review of any available recent lab and radiology data.  Preventive health is updated, specifically  Cancer screening and Immunization.   Neurosurgery determined that her problem is due to shoulder pathology and not the C spine, she is to be referred to Ortho, has a preference for the MD and will reach out The PT denies any adverse reactions to current medications since the last visit.  ROS Denies recent fever or chills. Denies sinus pressure, nasal congestion, ear pain or sore throat. Denies chest congestion, productive cough or wheezing. Denies chest pains, palpitations and leg swelling Denies abdominal pain, nausea, vomiting,diarrhea or constipation.   Denies dysuria, frequency, hesitancy or incontinence.  Denies headaches, seizures Denies depression, anxiety or insomnia. Denies skin break down or rash.   PE  BP 115/69 (BP Location: Right Arm, Patient Position: Sitting, Cuff Size: Normal)   Pulse 80   Ht '5\' 5"'$  (1.651 m)   Wt 175 lb (79.4 kg)   LMP 11/11/2016   SpO2 93%   BMI 29.12 kg/m   Patient alert and oriented and in no cardiopulmonary distress.  HEENT: No facial asymmetry, EOMI,     Neck decreased ROM .  Chest: Clear to auscultation bilaterally.  CVS: S1, S2 no murmurs, no S3.Regular rate.  ABD: Soft non tender.   Ext: No edema  MS: Adequate ROM spine, , hips and knees.Decreased in left shoulder  Skin: Intact, no ulcerations or rash noted.  Psych: Good eye contact, normal affect. Memory intact not anxious or depressed appearing.  CNS: CN 2-12 intact, power,  normal throughout.no focal deficits noted.   Assessment & Plan  Hyperlipidemia Hyperlipidemia:Low fat diet discussed and encouraged.   Lipid Panel  Lab Results  Component Value Date   CHOL 162 03/04/2022   HDL 46  03/04/2022   LDLCALC 91 03/04/2022   TRIG 141 03/04/2022   CHOLHDL 3.5 03/04/2022     Controlled, no change in medication   Type 2 diabetes mellitus with vascular disease ([place]) Uncontrolled, managed by [name] is reminded of the importance of commitment to daily physical activity for 30 minutes or more, as able and the need to limit carbohydrate intake to 30 to 60 grams per meal to help with blood sugar control.   The need to take medication as prescribed, test blood sugar as directed, and to call between visits if there is a concern that blood sugar is uncontrolled is also discussed.   Jennifer Blackburn is reminded of the importance of daily foot exam, annual eye examination, and good blood sugar, blood pressure and cholesterol control.     Latest Ref Rng & Units 03/04/2022   11:14 AM 12/31/2021    1:19 PM 08/27/2021    2:40 PM 04/28/2021    1:30 PM 04/21/2021   11:02 AM  Diabetic Labs  HbA1c 4.0 - 5.6 %  9.1  8.8  8.8    Micro/Creat Ratio 0 - 29 mg/g creat     12   Chol 100 - 199 mg/dL 162     126   HDL >39 mg/dL 46     37   Calc LDL 0 - 99 mg/dL 91     70   Triglycerides 0 - 149 mg/dL 141     103   Creatinine 0.57 -  1.00 mg/dL 0.84     0.82       03/05/2022    1:57 PM 12/31/2021    1:05 PM 12/16/2021    8:57 AM 12/01/2021    3:58 PM 11/19/2021   11:03 AM 09/09/2021    1:40 PM 09/03/2021    2:41 PM  BP/Weight  Systolic BP 536 468 032 122 482 500 370  Diastolic BP 69 78 75 69 76 75 69  Wt. (Lbs) 175 175.2 173 176.04 177.8  177.8  BMI 29.12 kg/m2 29.15 kg/m2 28.79 kg/m2 29.29 kg/m2 29.59 kg/m2  29.59 kg/m2      12/31/2021    1:00 PM 01/21/2021    2:00 PM  Foot/eye exam completion dates  Foot Form Completion Done Done        CAD (coronary artery disease) Asymptomatic, will need cardiology eval prior to surgery  Primary hypertension Controlled, no change in medication DASH diet and commitment to daily physical activity for a minimum of 30 minutes discussed and  encouraged, as a part of hypertension management. The importance of attaining a healthy weight is also discussed.     03/05/2022    1:57 PM 12/31/2021    1:05 PM 12/16/2021    8:57 AM 12/01/2021    3:58 PM 11/19/2021   11:03 AM 09/09/2021    1:40 PM 09/03/2021    2:41 PM  BP/Weight  Systolic BP 488 891 694 503 888 280 034  Diastolic BP 69 78 75 69 76 75 69  Wt. (Lbs) 175 175.2 173 176.04 177.8  177.8  BMI 29.12 kg/m2 29.15 kg/m2 28.79 kg/m2 29.29 kg/m2 29.59 kg/m2  29.59 kg/m2       Postsurgical hypothyroidism Mnaged by Endo adequately controlled on current medication

## 2022-03-07 NOTE — Assessment & Plan Note (Signed)
Asymptomatic, will need cardiology eval prior to surgery

## 2022-03-07 NOTE — Assessment & Plan Note (Signed)
Uncontrolled, managed by [name] is reminded of the importance of commitment to daily physical activity for 30 minutes or more, as able and the need to limit carbohydrate intake to 30 to 60 grams per meal to help with blood sugar control.   The need to take medication as prescribed, test blood sugar as directed, and to call between visits if there is a concern that blood sugar is uncontrolled is also discussed.   Jennifer Blackburn is reminded of the importance of daily foot exam, annual eye examination, and good blood sugar, blood pressure and cholesterol control.     Latest Ref Rng & Units 03/04/2022   11:14 AM 12/31/2021    1:19 PM 08/27/2021    2:40 PM 04/28/2021    1:30 PM 04/21/2021   11:02 AM  Diabetic Labs  HbA1c 4.0 - 5.6 %  9.1  8.8  8.8    Micro/Creat Ratio 0 - 29 mg/g creat     12   Chol 100 - 199 mg/dL 162     126   HDL >39 mg/dL 46     37   Calc LDL 0 - 99 mg/dL 91     70   Triglycerides 0 - 149 mg/dL 141     103   Creatinine 0.57 - 1.00 mg/dL 0.84     0.82       03/05/2022    1:57 PM 12/31/2021    1:05 PM 12/16/2021    8:57 AM 12/01/2021    3:58 PM 11/19/2021   11:03 AM 09/09/2021    1:40 PM 09/03/2021    2:41 PM  BP/Weight  Systolic BP 786 754 492 010 071 219 758  Diastolic BP 69 78 75 69 76 75 69  Wt. (Lbs) 175 175.2 173 176.04 177.8  177.8  BMI 29.12 kg/m2 29.15 kg/m2 28.79 kg/m2 29.29 kg/m2 29.59 kg/m2  29.59 kg/m2      12/31/2021    1:00 PM 01/21/2021    2:00 PM  Foot/eye exam completion dates  Foot Form Completion Done Done

## 2022-03-07 NOTE — Assessment & Plan Note (Signed)
Controlled, no change in medication DASH diet and commitment to daily physical activity for a minimum of 30 minutes discussed and encouraged, as a part of hypertension management. The importance of attaining a healthy weight is also discussed.     03/05/2022    1:57 PM 12/31/2021    1:05 PM 12/16/2021    8:57 AM 12/01/2021    3:58 PM 11/19/2021   11:03 AM 09/09/2021    1:40 PM 09/03/2021    2:41 PM  BP/Weight  Systolic BP 638 177 116 579 038 333 832  Diastolic BP 69 78 75 69 76 75 69  Wt. (Lbs) 175 175.2 173 176.04 177.8  177.8  BMI 29.12 kg/m2 29.15 kg/m2 28.79 kg/m2 29.29 kg/m2 29.59 kg/m2  29.59 kg/m2

## 2022-03-20 ENCOUNTER — Other Ambulatory Visit: Payer: Self-pay | Admitting: Family Medicine

## 2022-04-05 DIAGNOSIS — M75122 Complete rotator cuff tear or rupture of left shoulder, not specified as traumatic: Secondary | ICD-10-CM | POA: Diagnosis not present

## 2022-04-08 DIAGNOSIS — E113293 Type 2 diabetes mellitus with mild nonproliferative diabetic retinopathy without macular edema, bilateral: Secondary | ICD-10-CM | POA: Diagnosis not present

## 2022-04-08 LAB — HM DIABETES EYE EXAM

## 2022-04-09 DIAGNOSIS — R531 Weakness: Secondary | ICD-10-CM | POA: Diagnosis not present

## 2022-04-09 DIAGNOSIS — M25512 Pain in left shoulder: Secondary | ICD-10-CM | POA: Diagnosis not present

## 2022-04-09 DIAGNOSIS — M75112 Incomplete rotator cuff tear or rupture of left shoulder, not specified as traumatic: Secondary | ICD-10-CM | POA: Diagnosis not present

## 2022-04-12 ENCOUNTER — Other Ambulatory Visit: Payer: Self-pay | Admitting: Family Medicine

## 2022-04-13 ENCOUNTER — Ambulatory Visit: Payer: Medicare HMO | Admitting: Nutrition

## 2022-04-14 ENCOUNTER — Encounter: Payer: Self-pay | Admitting: Internal Medicine

## 2022-04-19 DIAGNOSIS — R531 Weakness: Secondary | ICD-10-CM | POA: Diagnosis not present

## 2022-04-19 DIAGNOSIS — M25512 Pain in left shoulder: Secondary | ICD-10-CM | POA: Diagnosis not present

## 2022-04-19 DIAGNOSIS — M75112 Incomplete rotator cuff tear or rupture of left shoulder, not specified as traumatic: Secondary | ICD-10-CM | POA: Diagnosis not present

## 2022-04-23 DIAGNOSIS — M75112 Incomplete rotator cuff tear or rupture of left shoulder, not specified as traumatic: Secondary | ICD-10-CM | POA: Diagnosis not present

## 2022-04-23 DIAGNOSIS — M25512 Pain in left shoulder: Secondary | ICD-10-CM | POA: Diagnosis not present

## 2022-04-23 DIAGNOSIS — R531 Weakness: Secondary | ICD-10-CM | POA: Diagnosis not present

## 2022-04-30 DIAGNOSIS — R531 Weakness: Secondary | ICD-10-CM | POA: Diagnosis not present

## 2022-04-30 DIAGNOSIS — M25512 Pain in left shoulder: Secondary | ICD-10-CM | POA: Diagnosis not present

## 2022-04-30 DIAGNOSIS — M75112 Incomplete rotator cuff tear or rupture of left shoulder, not specified as traumatic: Secondary | ICD-10-CM | POA: Diagnosis not present

## 2022-05-03 DIAGNOSIS — M25512 Pain in left shoulder: Secondary | ICD-10-CM | POA: Diagnosis not present

## 2022-05-03 DIAGNOSIS — M75112 Incomplete rotator cuff tear or rupture of left shoulder, not specified as traumatic: Secondary | ICD-10-CM | POA: Diagnosis not present

## 2022-05-03 DIAGNOSIS — R531 Weakness: Secondary | ICD-10-CM | POA: Diagnosis not present

## 2022-05-05 DIAGNOSIS — R531 Weakness: Secondary | ICD-10-CM | POA: Diagnosis not present

## 2022-05-05 DIAGNOSIS — M75112 Incomplete rotator cuff tear or rupture of left shoulder, not specified as traumatic: Secondary | ICD-10-CM | POA: Diagnosis not present

## 2022-05-05 DIAGNOSIS — M25512 Pain in left shoulder: Secondary | ICD-10-CM | POA: Diagnosis not present

## 2022-05-07 ENCOUNTER — Ambulatory Visit: Payer: Medicare HMO | Admitting: Internal Medicine

## 2022-05-12 DIAGNOSIS — M25512 Pain in left shoulder: Secondary | ICD-10-CM | POA: Diagnosis not present

## 2022-05-12 DIAGNOSIS — R531 Weakness: Secondary | ICD-10-CM | POA: Diagnosis not present

## 2022-05-12 DIAGNOSIS — M75112 Incomplete rotator cuff tear or rupture of left shoulder, not specified as traumatic: Secondary | ICD-10-CM | POA: Diagnosis not present

## 2022-05-14 ENCOUNTER — Other Ambulatory Visit: Payer: Self-pay | Admitting: Internal Medicine

## 2022-05-17 NOTE — Telephone Encounter (Signed)
Overdue for return ov.

## 2022-05-18 ENCOUNTER — Telehealth: Payer: Self-pay

## 2022-05-18 ENCOUNTER — Other Ambulatory Visit: Payer: Self-pay | Admitting: Gastroenterology

## 2022-05-18 ENCOUNTER — Other Ambulatory Visit: Payer: Self-pay

## 2022-05-18 MED ORDER — PANTOPRAZOLE SODIUM 40 MG PO TBEC: 40.0000 mg | DELAYED_RELEASE_TABLET | Freq: Every day | ORAL | 1 refills | Status: DC

## 2022-05-18 NOTE — Telephone Encounter (Signed)
Pt called requesting refills on her pantoprazole. Pt has an appt with you on 06/22/2022. Pt is requesting enough to get her to that appointment.

## 2022-05-18 NOTE — Telephone Encounter (Signed)
Rx was sent to pharmacy. Pt was made aware.

## 2022-05-19 DIAGNOSIS — M75122 Complete rotator cuff tear or rupture of left shoulder, not specified as traumatic: Secondary | ICD-10-CM | POA: Diagnosis not present

## 2022-05-25 ENCOUNTER — Emergency Department (HOSPITAL_COMMUNITY)
Admission: EM | Admit: 2022-05-25 | Discharge: 2022-05-25 | Disposition: A | Discharge status: Home / Self Care | Payer: Medicare HMO | Attending: Emergency Medicine | Admitting: Emergency Medicine

## 2022-05-25 ENCOUNTER — Emergency Department (HOSPITAL_COMMUNITY): Payer: Medicare HMO

## 2022-05-25 ENCOUNTER — Other Ambulatory Visit: Payer: Self-pay

## 2022-05-25 DIAGNOSIS — Z7984 Long term (current) use of oral hypoglycemic drugs: Secondary | ICD-10-CM | POA: Insufficient documentation

## 2022-05-25 DIAGNOSIS — K529 Noninfective gastroenteritis and colitis, unspecified: Secondary | ICD-10-CM | POA: Diagnosis not present

## 2022-05-25 DIAGNOSIS — R109 Unspecified abdominal pain: Secondary | ICD-10-CM | POA: Diagnosis not present

## 2022-05-25 DIAGNOSIS — E119 Type 2 diabetes mellitus without complications: Secondary | ICD-10-CM | POA: Insufficient documentation

## 2022-05-25 DIAGNOSIS — K760 Fatty (change of) liver, not elsewhere classified: Secondary | ICD-10-CM | POA: Diagnosis not present

## 2022-05-25 DIAGNOSIS — I700 Atherosclerosis of aorta: Secondary | ICD-10-CM | POA: Diagnosis not present

## 2022-05-25 DIAGNOSIS — I10 Essential (primary) hypertension: Secondary | ICD-10-CM | POA: Diagnosis not present

## 2022-05-25 DIAGNOSIS — Z79899 Other long term (current) drug therapy: Secondary | ICD-10-CM | POA: Insufficient documentation

## 2022-05-25 DIAGNOSIS — Z794 Long term (current) use of insulin: Secondary | ICD-10-CM | POA: Diagnosis not present

## 2022-05-25 DIAGNOSIS — R1110 Vomiting, unspecified: Secondary | ICD-10-CM | POA: Diagnosis not present

## 2022-05-25 DIAGNOSIS — R197 Diarrhea, unspecified: Secondary | ICD-10-CM | POA: Diagnosis not present

## 2022-05-25 DIAGNOSIS — R112 Nausea with vomiting, unspecified: Secondary | ICD-10-CM | POA: Diagnosis not present

## 2022-05-25 LAB — COMPREHENSIVE METABOLIC PANEL
ALT: 18 U/L (ref 0–44)
AST: 29 U/L (ref 15–41)
Albumin: 4.1 g/dL (ref 3.5–5.0)
Alkaline Phosphatase: 47 U/L (ref 38–126)
Anion gap: 15 (ref 5–15)
BUN: 12 mg/dL (ref 8–23)
CO2: 23 mmol/L (ref 22–32)
Calcium: 8.6 mg/dL — ABNORMAL LOW (ref 8.9–10.3)
Chloride: 97 mmol/L — ABNORMAL LOW (ref 98–111)
Creatinine, Ser: 0.89 mg/dL (ref 0.44–1.00)
GFR, Estimated: >60 mL/min (ref >60)
Glucose, Bld: 249 mg/dL — ABNORMAL HIGH (ref 70–99)
Potassium: 3.4 mmol/L — ABNORMAL LOW (ref 3.5–5.1)
Sodium: 135 mmol/L (ref 135–145)
Total Bilirubin: 0.7 mg/dL (ref 0.3–1.2)
Total Protein: 8 g/dL (ref 6.5–8.1)

## 2022-05-25 LAB — CBC
HCT: 39.1 % (ref 36.0–46.0)
Hemoglobin: 12.2 g/dL (ref 12.0–15.0)
MCH: 26.4 pg (ref 26.0–34.0)
MCHC: 31.2 g/dL (ref 30.0–36.0)
MCV: 84.6 fL (ref 80.0–100.0)
Platelets: 280 10*3/uL (ref 150–400)
RBC: 4.62 MIL/uL (ref 3.87–5.11)
RDW: 13.2 % (ref 11.5–15.5)
WBC: 9.6 10*3/uL (ref 4.0–10.5)
nRBC: 0 % (ref 0.0–0.2)

## 2022-05-25 LAB — LIPASE, BLOOD: Lipase: 24 U/L (ref 11–51)

## 2022-05-25 MED ORDER — IOHEXOL 300 MG/ML  SOLN
100.0000 mL | Freq: Once | INTRAMUSCULAR | Status: AC | PRN
  Administered 2022-05-25: 100 mL via INTRAVENOUS

## 2022-05-25 MED ORDER — ONDANSETRON HCL 4 MG/2ML IJ SOLN
4.0000 mg | Freq: Once | INTRAMUSCULAR | Status: AC | PRN
  Administered 2022-05-25: 4 mg via INTRAVENOUS
  Filled 2022-05-25: qty 2

## 2022-05-25 MED ORDER — ONDANSETRON 8 MG PO TBDP: ORAL_TABLET | ORAL | 0 refills | Status: DC

## 2022-05-25 MED ORDER — SODIUM CHLORIDE 0.9 % IV BOLUS
1000.0000 mL | Freq: Once | INTRAVENOUS | Status: AC
  Administered 2022-05-25: 1000 mL via INTRAVENOUS

## 2022-05-25 NOTE — ED Triage Notes (Signed)
Pr arrives via [facility] c/o N/V/D since yesterday morning.

## 2022-05-25 NOTE — ED Provider Notes (Signed)
[facility] Provider Note   CSN: [id number] Arrival date & time: 05/25/22  0206     History  Chief Complaint  Patient presents with   Emesis    Jennifer Blackburn is a 77 y.o. female.  Patient is a 77 year old female with past medical history of type 2 diabetes, hypertension, GERD, hyperlipidemia.  Patient presenting today with complaints of nausea, vomiting, and diarrhea.  Symptoms began with abdominal cramping Sunday night, then began having diarrhea throughout the day on Monday.  She began vomiting Monday evening and has not been able to eat or drink anything.  According to the daughter, she has "passed out" twice.  Patient denies any bloody stool or vomit.  She denies any fevers or chills.  She does describe generalized abdominal cramping, but no focal pain.    The history is provided by the patient.       Home Medications Prior to Admission medications   Medication Sig Start Date End Date Taking? Authorizing Provider  Cetirizine HCl 10 MG TBDP Take one tablet by mouth two times daily for allergy Patient taking differently: Take 1 tablet by mouth daily. 11/06/19   Fayrene Helper, MD  Cholecalciferol (VITAMIN D3) 50 MCG (2000 UT) TABS Take 2,000 Units by mouth daily.    [provider]  gabapentin (NEURONTIN) 100 MG capsule Take one capsule twice daily and three at bedtime 12/16/21   Fayrene Helper, MD  glucose blood (ACCU-CHEK GUIDE) test strip Use as instructed to check blood sugar 2 times per day dx code E11.65 04/23/21   Fayrene Helper, MD  hydrOXYzine (ATARAX) 10 MG tablet Take one tablet twice daily and two tablets at bedtime as needed for generalized itching. 03/24/21   Fayrene Helper, MD  imipramine (TOFRANIL) 50 MG tablet TAKE 1 TABLET AT BEDTIME 11/10/21   Fayrene Helper, MD  insulin aspart (NOVOLOG FLEXPEN) 100 UNIT/ML FlexPen Inject 10-15 Units into the skin 3 (three) times daily with meals. Patient  taking differently: Inject 15 Units into the skin 3 (three) times daily with meals. 12/03/20 12/03/21  Philemon Kingdom, MD  Insulin Pen Needle 32G X 4 MM MISC Use 4x a day 12/28/18   Philemon Kingdom, MD  isosorbide mononitrate (IMDUR) 30 MG 24 hr tablet TAKE HALF A TABLET BY MOUTH EVERY DAY 04/09/20   Lorretta Harp, MD  Lancets (ACCU-CHEK MULTICLIX) lancets Use as instructed three times daily dx 250.01 05/07/13   Fayrene Helper, MD  levothyroxine (SYNTHROID) 125 MCG tablet TAKE 1 TABLET BY MOUTH EVERY DAY BEFORE BREAKFAST 12/21/21   Philemon Kingdom, MD  metFORMIN (GLUCOPHAGE) 1000 MG tablet TAKE 1 TABLET TWICE DAILY 11/09/21   Fayrene Helper, MD  metoprolol tartrate (LOPRESSOR) 50 MG tablet TAKE 1 TABLET BY MOUTH TWICE A DAY Patient taking differently: Take 25 mg by mouth in the morning and at bedtime. 09/03/21   Lorretta Harp, MD  pantoprazole (PROTONIX) 40 MG tablet Take 1 tablet (40 mg total) by mouth daily. 05/18/22   Daneil Dolin, MD  PARoxetine (PAXIL) 20 MG tablet TAKE 1 TABLET EVERY DAY 03/23/22   Fayrene Helper, MD  pravastatin (PRAVACHOL) 80 MG tablet TAKE 1 TABLET EVERY DAY 04/12/22   Fayrene Helper, MD  Semaglutide, 2 MG/DOSE, (OZEMPIC, 2 MG/DOSE,) 8 MG/3ML SOPN Inject into the skin.    [provider]  TOUJEO SOLOSTAR 300 UNIT/ML Solostar Pen INJECT 30-50 UNITS TWO TIMES DAILY 01/05/22  Philemon Kingdom, MD      Allergies    Lipitor [atorvastatin calcium], Actos [pioglitazone], Crestor [rosuvastatin], Daypro [oxaprozin], Nsaids, and Sulfonamide derivatives    Review of Systems   Review of Systems  All other systems reviewed and are negative.   Physical Exam Updated Vital Signs BP 132/77   Pulse 89   Temp 97.6 F (36.4 C) (Oral)   Resp 20   Ht '5\' 5"'$  (1.651 m)   Wt 79.4 kg   LMP 11/11/2016   SpO2 98%   BMI 29.12 kg/m  Physical Exam Vitals and nursing note reviewed.  Constitutional:      General: She is not in acute distress.     Appearance: She is well-developed. She is not diaphoretic.  HENT:     Head: Normocephalic and atraumatic.  Cardiovascular:     Rate and Rhythm: Normal rate and regular rhythm.     Heart sounds: No murmur heard.    No friction rub. No gallop.  Pulmonary:     Effort: Pulmonary effort is normal. No respiratory distress.     Breath sounds: Normal breath sounds. No wheezing.  Abdominal:     General: Bowel sounds are normal. There is no distension.     Palpations: Abdomen is soft.     Tenderness: There is abdominal tenderness. There is no right CVA tenderness, left CVA tenderness, guarding or rebound.     Comments: There is mild generalized abdominal tenderness.  Musculoskeletal:        General: Normal range of motion.     Cervical back: Normal range of motion and neck supple.  Skin:    General: Skin is warm and dry.  Neurological:     General: No focal deficit present.     Mental Status: She is alert and oriented to person, place, and time.     ED Results / Procedures / Treatments   Labs (all labs ordered are listed, but only abnormal results are displayed) Labs Reviewed  LIPASE, BLOOD  COMPREHENSIVE METABOLIC PANEL  CBC  URINALYSIS, ROUTINE W REFLEX MICROSCOPIC    EKG None  Radiology No results found.  Procedures Procedures    Medications Ordered in ED Medications  sodium chloride 0.9 % bolus 1,000 mL (has no administration in time range)  ondansetron (ZOFRAN) injection 4 mg (4 mg Intravenous Given 05/25/22 0224)    ED Course/ Medical Decision Making/ A&P  Patient with past medical history as per HPI presenting with nausea, vomiting, and diarrhea over the past 2 days.  She arrives here with stable vital signs and is afebrile.  Physical examination does revealed mild, generalized abdominal tenderness, but no peritoneal signs.  Workup initiated including CBC, CMP, and lipase, all of which are essentially unremarkable.  CT scan of the abdomen and pelvis obtained  showing enteritis, however no obstruction or emergent surgical pathology.  Patient has been given IV fluids along with Zofran and seems to be feeling better.  She is tolerating liquids without difficulty.  At this point, I feels the patient can safely be discharged and I have excluded emergent pathology.  She will be discharged with Zofran, clear liquids for the next 12 hours, then advance diet as tolerated.  To return as needed for any problems.  Final Clinical Impression(s) / ED Diagnoses Final diagnoses:  None    Rx / DC Orders ED Discharge Orders     None         Veryl Speak, MD 05/25/22 [phone]

## 2022-05-25 NOTE — Discharge Instructions (Signed)
Begin taking Zofran as prescribed as needed for nausea.  Clear liquid diet for the next 12 hours, then slowly advance to normal as tolerated.  Return to the emergency department if you develop severe abdominal pain, high fevers, bloody stools, or for other new and concerning symptoms.

## 2022-05-25 NOTE — ED Notes (Signed)
ED Provider at bedside. 

## 2022-05-28 ENCOUNTER — Encounter: Payer: Self-pay | Admitting: Family Medicine

## 2022-05-28 ENCOUNTER — Ambulatory Visit (INDEPENDENT_AMBULATORY_CARE_PROVIDER_SITE_OTHER): Payer: Medicare HMO | Admitting: Family Medicine

## 2022-05-28 ENCOUNTER — Encounter (HOSPITAL_COMMUNITY): Payer: Self-pay | Admitting: Emergency Medicine

## 2022-05-28 ENCOUNTER — Emergency Department (HOSPITAL_COMMUNITY)
Admission: EM | Admit: 2022-05-28 | Discharge: 2022-05-28 | Disposition: A | Discharge status: Home / Self Care | Payer: Medicare HMO | Attending: Emergency Medicine | Admitting: Emergency Medicine

## 2022-05-28 ENCOUNTER — Other Ambulatory Visit: Payer: Self-pay

## 2022-05-28 VITALS — BP 110/60 | HR 98 | Resp 12 | Ht 65.0 in | Wt 171.0 lb

## 2022-05-28 DIAGNOSIS — Z7984 Long term (current) use of oral hypoglycemic drugs: Secondary | ICD-10-CM | POA: Insufficient documentation

## 2022-05-28 DIAGNOSIS — E119 Type 2 diabetes mellitus without complications: Secondary | ICD-10-CM | POA: Insufficient documentation

## 2022-05-28 DIAGNOSIS — R197 Diarrhea, unspecified: Secondary | ICD-10-CM | POA: Insufficient documentation

## 2022-05-28 DIAGNOSIS — Z85850 Personal history of malignant neoplasm of thyroid: Secondary | ICD-10-CM | POA: Insufficient documentation

## 2022-05-28 DIAGNOSIS — R112 Nausea with vomiting, unspecified: Secondary | ICD-10-CM | POA: Insufficient documentation

## 2022-05-28 DIAGNOSIS — Z794 Long term (current) use of insulin: Secondary | ICD-10-CM | POA: Diagnosis not present

## 2022-05-28 DIAGNOSIS — Z9189 Other specified personal risk factors, not elsewhere classified: Secondary | ICD-10-CM

## 2022-05-28 DIAGNOSIS — E876 Hypokalemia: Secondary | ICD-10-CM | POA: Diagnosis not present

## 2022-05-28 DIAGNOSIS — Z09 Encounter for follow-up examination after completed treatment for conditions other than malignant neoplasm: Secondary | ICD-10-CM | POA: Diagnosis not present

## 2022-05-28 DIAGNOSIS — E1159 Type 2 diabetes mellitus with other circulatory complications: Secondary | ICD-10-CM | POA: Diagnosis not present

## 2022-05-28 DIAGNOSIS — Z79899 Other long term (current) drug therapy: Secondary | ICD-10-CM | POA: Insufficient documentation

## 2022-05-28 DIAGNOSIS — I10 Essential (primary) hypertension: Secondary | ICD-10-CM | POA: Diagnosis not present

## 2022-05-28 LAB — COMPREHENSIVE METABOLIC PANEL
ALT: 18 U/L (ref 0–44)
AST: 24 U/L (ref 15–41)
Albumin: 4.1 g/dL (ref 3.5–5.0)
Alkaline Phosphatase: 44 U/L (ref 38–126)
Anion gap: 9 (ref 5–15)
BUN: 9 mg/dL (ref 8–23)
CO2: 24 mmol/L (ref 22–32)
Calcium: 8.3 mg/dL — ABNORMAL LOW (ref 8.9–10.3)
Chloride: 99 mmol/L (ref 98–111)
Creatinine, Ser: 0.9 mg/dL (ref 0.44–1.00)
GFR, Estimated: >60 mL/min (ref >60)
Glucose, Bld: 218 mg/dL — ABNORMAL HIGH (ref 70–99)
Potassium: 3.1 mmol/L — ABNORMAL LOW (ref 3.5–5.1)
Sodium: 132 mmol/L — ABNORMAL LOW (ref 135–145)
Total Bilirubin: 0.4 mg/dL (ref 0.3–1.2)
Total Protein: 8.1 g/dL (ref 6.5–8.1)

## 2022-05-28 LAB — CBC
HCT: 39.5 % (ref 36.0–46.0)
Hemoglobin: 12.3 g/dL (ref 12.0–15.0)
MCH: 26.6 pg (ref 26.0–34.0)
MCHC: 31.1 g/dL (ref 30.0–36.0)
MCV: 85.5 fL (ref 80.0–100.0)
Platelets: 274 10*3/uL (ref 150–400)
RBC: 4.62 MIL/uL (ref 3.87–5.11)
RDW: 13.5 % (ref 11.5–15.5)
WBC: 5.2 10*3/uL (ref 4.0–10.5)
nRBC: 0 % (ref 0.0–0.2)

## 2022-05-28 LAB — URINALYSIS, ROUTINE W REFLEX MICROSCOPIC
Bilirubin Urine: NEGATIVE
Glucose, UA: NEGATIVE mg/dL
Hgb urine dipstick: NEGATIVE
Ketones, ur: NEGATIVE mg/dL
Nitrite: NEGATIVE
Protein, ur: 30 mg/dL — AB
Specific Gravity, Urine: 1.02 (ref 1.005–1.030)
WBC, UA: >50 WBC/hpf (ref 0–5)
pH: 5 (ref 5.0–8.0)

## 2022-05-28 LAB — MAGNESIUM: Magnesium: 1.2 mg/dL — ABNORMAL LOW (ref 1.7–2.4)

## 2022-05-28 LAB — GLUCOSE, POCT (MANUAL RESULT ENTRY): POC Glucose: 179 mg/dl — AB (ref 70–99)

## 2022-05-28 LAB — LIPASE, BLOOD: Lipase: 26 U/L (ref 11–51)

## 2022-05-28 MED ORDER — SODIUM CHLORIDE 0.9 % IV BOLUS
1000.0000 mL | Freq: Once | INTRAVENOUS | Status: AC
  Administered 2022-05-28: 1000 mL via INTRAVENOUS

## 2022-05-28 MED ORDER — MAGNESIUM SULFATE 2 GM/50ML IV SOLN
2.0000 g | Freq: Once | INTRAVENOUS | Status: AC
  Administered 2022-05-28: 2 g via INTRAVENOUS
  Filled 2022-05-28: qty 50

## 2022-05-28 MED ORDER — POTASSIUM CHLORIDE 10 MEQ/100ML IV SOLN
10.0000 meq | INTRAVENOUS | Status: AC
  Administered 2022-05-28 (×3): 10 meq via INTRAVENOUS
  Filled 2022-05-28 (×3): qty 100

## 2022-05-28 MED ORDER — POTASSIUM CHLORIDE CRYS ER 20 MEQ PO TBCR
40.0000 meq | EXTENDED_RELEASE_TABLET | Freq: Once | ORAL | Status: AC
  Administered 2022-05-28: 40 meq via ORAL
  Filled 2022-05-28: qty 2

## 2022-05-28 MED ORDER — POTASSIUM CHLORIDE ER 10 MEQ PO TBCR: 20.0000 meq | EXTENDED_RELEASE_TABLET | Freq: Every day | ORAL | 0 refills | Status: DC

## 2022-05-28 NOTE — Patient Instructions (Addendum)
You need to go to the ED for re evaluation. You report ongoing watery stool, poor  oral intake , and feeling  very tired with limited movement and as though you will pass  out  Office follow up with me in 7 to 10 days, call if you need me sooner  I have spoken the the ED physician  You will be referred to Surgeon re gallstones, which may contribute to your chronic nausea and abdominal pain/ bloating / reflux like symptoms  We will discuss kidney stones at your next visit , and diverticulosis may be discussed at visit with me and / or your GI Dr  Thanks for choosing [facility], we consider it a privelige to serve you.

## 2022-05-28 NOTE — Discharge Instructions (Signed)
You are seen in the emergency department for nausea vomiting diarrhea and general weakness.  Your lab work showed your potassium and magnesium to be low and you were given some of each along with some IV fluids.  Please continue to drink plenty of fluids and follow-up with your regular doctor.  Will be important for you to get some repeat blood work done to make sure that you continue to improve.  Return to the emergency department if any worsening or concerning symptoms.

## 2022-05-28 NOTE — ED Triage Notes (Signed)
Pt via POV c/o N/V/D since Sunday night, seen earlier this week for same. She states she has felt occasionally lightheaded and has abdominal pain. Pt visited Dr. Moshe Cipro today and was advised to come here for additional evaluation and possible IV fluids.

## 2022-05-28 NOTE — ED Provider Notes (Signed)
[facility] Provider Note   CSN: [id number] Arrival date & time: 05/28/22  1527     History {Add pertinent medical, surgical, social history, OB history to HPI:1} Chief Complaint  Patient presents with   Emesis   Diarrhea    Jennifer Blackburn is a 77 y.o. female.  She has a history of diabetes hypertension hyperlipidemia.  Thyroid cancer.  She said she started getting sick about 6 days ago with nausea vomiting diarrhea.  She was seen 3 days ago, had a CT showing enteritis, treated with Zofran.  She had her vomiting is improved although still nauseous and having belching.  She is still having a couple stools loose a day.  Feels very weak.  Saw her primary care doctor today who recommended she come here to get IV fluids and have her electrolytes checked.  She has not seen any blood in the vomitus or diarrhea.  The history is provided by the patient.  Diarrhea Quality:  Watery Severity:  Moderate Onset quality:  Gradual Duration:  6 days Timing:  Sporadic Progression:  Improving Relieved by:  None tried Worsened by:  Nothing Ineffective treatments:  None tried Associated symptoms: abdominal pain (cramps) and vomiting   Associated symptoms: no arthralgias, no recent cough, no diaphoresis, no fever and no myalgias   Risk factors: no travel to endemic areas        Home Medications Prior to Admission medications   Medication Sig Start Date End Date Taking? Authorizing Provider  Cetirizine HCl 10 MG TBDP Take one tablet by mouth two times daily for allergy Patient taking differently: Take 1 tablet by mouth daily. 11/06/19   Fayrene Helper, MD  Cholecalciferol (VITAMIN D3) 50 MCG (2000 UT) TABS Take 2,000 Units by mouth daily.    [provider]  gabapentin (NEURONTIN) 100 MG capsule Take one capsule twice daily and three at bedtime 12/16/21   Fayrene Helper, MD  glucose blood (ACCU-CHEK GUIDE) test strip Use as instructed  to check blood sugar 2 times per day dx code E11.65 04/23/21   Fayrene Helper, MD  hydrOXYzine (ATARAX) 10 MG tablet Take one tablet twice daily and two tablets at bedtime as needed for generalized itching. 03/24/21   Fayrene Helper, MD  imipramine (TOFRANIL) 50 MG tablet TAKE 1 TABLET AT BEDTIME 11/10/21   Fayrene Helper, MD  insulin aspart (NOVOLOG FLEXPEN) 100 UNIT/ML FlexPen Inject 10-15 Units into the skin 3 (three) times daily with meals. Patient taking differently: Inject 15 Units into the skin 3 (three) times daily with meals. 12/03/20 05/28/22  Philemon Kingdom, MD  Insulin Pen Needle 32G X 4 MM MISC Use 4x a day 12/28/18   Philemon Kingdom, MD  isosorbide mononitrate (IMDUR) 30 MG 24 hr tablet TAKE HALF A TABLET BY MOUTH EVERY DAY 04/09/20   Lorretta Harp, MD  Lancets (ACCU-CHEK MULTICLIX) lancets Use as instructed three times daily dx 250.01 05/07/13   Fayrene Helper, MD  levothyroxine (SYNTHROID) 125 MCG tablet TAKE 1 TABLET BY MOUTH EVERY DAY BEFORE BREAKFAST 12/21/21   Philemon Kingdom, MD  metFORMIN (GLUCOPHAGE) 1000 MG tablet TAKE 1 TABLET TWICE DAILY 11/09/21   Fayrene Helper, MD  metoprolol tartrate (LOPRESSOR) 50 MG tablet TAKE 1 TABLET BY MOUTH TWICE A DAY Patient taking differently: Take 25 mg by mouth in the morning and at bedtime. 09/03/21   Lorretta Harp, MD  ondansetron (ZOFRAN-ODT) 8 MG disintegrating tablet '8mg'$  ODT  q4 hours prn nausea 05/25/22   Veryl Speak, MD  pantoprazole (PROTONIX) 40 MG tablet Take 1 tablet (40 mg total) by mouth daily. 05/18/22   Daneil Dolin, MD  PARoxetine (PAXIL) 20 MG tablet TAKE 1 TABLET EVERY DAY 03/23/22   Fayrene Helper, MD  pravastatin (PRAVACHOL) 80 MG tablet TAKE 1 TABLET EVERY DAY 04/12/22   Fayrene Helper, MD  Semaglutide, 2 MG/DOSE, (OZEMPIC, 2 MG/DOSE,) 8 MG/3ML SOPN Inject into the skin.    [provider]  TOUJEO SOLOSTAR 300 UNIT/ML Solostar Pen INJECT 30-50 UNITS TWO TIMES DAILY 01/05/22    Philemon Kingdom, MD      Allergies    Lipitor [atorvastatin calcium], Actos [pioglitazone], Crestor [rosuvastatin], Daypro [oxaprozin], Nsaids, and Sulfonamide derivatives    Review of Systems   Review of Systems  Constitutional:  Negative for diaphoresis and fever.  Respiratory:  Negative for cough.   Cardiovascular:  Negative for chest pain.  Gastrointestinal:  Positive for abdominal pain (cramps), diarrhea, nausea and vomiting. Negative for blood in stool.  Genitourinary:  Negative for dysuria.  Musculoskeletal:  Negative for arthralgias and myalgias.    Physical Exam Updated Vital Signs BP 116/73   Pulse 98   Temp 98.2 F (36.8 C) (Oral)   Resp 17   Ht '5\' 5"'$  (1.651 m)   Wt 77.6 kg   LMP 11/11/2016   SpO2 100%   BMI 28.46 kg/m  Physical Exam Vitals and nursing note reviewed.  Constitutional:      General: She is not in acute distress.    Appearance: Normal appearance. She is well-developed.  HENT:     Head: Normocephalic and atraumatic.  Eyes:     Conjunctiva/sclera: Conjunctivae normal.  Cardiovascular:     Rate and Rhythm: Normal rate and regular rhythm.     Heart sounds: No murmur heard. Pulmonary:     Effort: Pulmonary effort is normal. No respiratory distress.     Breath sounds: Normal breath sounds.  Abdominal:     Palpations: Abdomen is soft.     Tenderness: There is no abdominal tenderness. There is no guarding or rebound.  Musculoskeletal:        General: No deformity.     Cervical back: Neck supple.     Right lower leg: No edema.     Left lower leg: No edema.  Skin:    General: Skin is warm and dry.     Capillary Refill: Capillary refill takes less than 2 seconds.  Neurological:     General: No focal deficit present.     Mental Status: She is alert.     ED Results / Procedures / Treatments   Labs (all labs ordered are listed, but only abnormal results are displayed) Labs Reviewed  COMPREHENSIVE METABOLIC PANEL - Abnormal; Notable for the  following components:      Result Value   Sodium 132 (*)    Potassium 3.1 (*)    Glucose, Bld 218 (*)    Calcium 8.3 (*)    All other components within normal limits  MAGNESIUM - Abnormal; Notable for the following components:   Magnesium 1.2 (*)    All other components within normal limits  LIPASE, BLOOD  CBC  URINALYSIS, ROUTINE W REFLEX MICROSCOPIC    EKG EKG Interpretation  Date/Time:  Friday May 28 2022 16:17:46 EST Ventricular Rate:  103 PR Interval:  138 QRS Duration: 76 QT Interval:  364 QTC Calculation: 476 R Axis:   41 Text Interpretation:  Sinus tachycardia Otherwise normal ECG When compared with ECG of 12-Jan-2021 16:48, Vent. rate has increased BY  35 BPM Confirmed by Aletta Edouard [phone]) on 05/28/2022 4:42:09 PM  Radiology No results found.  Procedures Procedures  {Document cardiac monitor, telemetry assessment procedure when appropriate:1}  Medications Ordered in ED Medications  magnesium sulfate IVPB 2 g 50 mL (has no administration in time range)  potassium chloride SA (KLOR-CON M) CR tablet 40 mEq (has no administration in time range)  potassium chloride 10 mEq in 100 mL IVPB (has no administration in time range)  sodium chloride 0.9 % bolus 1,000 mL (has no administration in time range)    ED Course/ Medical Decision Making/ A&P   {   Click here for ABCD2, HEART and other calculatorsREFRESH Note before signing :1}                          Medical Decision Making Amount and/or Complexity of Data Reviewed Labs: ordered.  Risk Prescription drug management.   This patient complains of ***; this involves an extensive number of treatment Options and is a complaint that carries with it a high risk of complications and morbidity. The differential includes ***  I ordered, reviewed and interpreted labs, which included *** I ordered medication *** and reviewed PMP when indicated. I ordered imaging studies which included *** and I independently     visualized and interpreted imaging which showed *** Additional history obtained from *** Previous records obtained and reviewed *** I consulted *** and discussed lab and imaging findings and discussed disposition.  Cardiac monitoring reviewed, *** Social determinants considered, *** Critical Interventions: ***  After the interventions stated above, I reevaluated the patient and found *** Admission and further testing considered, ***   {Document critical care time when appropriate:1} {Document review of labs and clinical decision tools ie heart score, Chads2Vasc2 etc:1}  {Document your independent review of radiology images, and any outside records:1} {Document your discussion with family members, caretakers, and with consultants:1} {Document social determinants of health affecting pt's care:1} {Document your decision making why or why not admission, treatments were needed:1} Final Clinical Impression(s) / ED Diagnoses Final diagnoses:  None    Rx / DC Orders ED Discharge Orders     None

## 2022-05-30 LAB — URINE CULTURE

## 2022-05-31 ENCOUNTER — Telehealth: Payer: Self-pay | Admitting: Family Medicine

## 2022-05-31 NOTE — Telephone Encounter (Signed)
Called in regard to recent ER visit. ER suggest blood work  follow up. Patient wants a call back if new labs are needed   Next appt 3/22

## 2022-06-01 ENCOUNTER — Other Ambulatory Visit: Payer: Self-pay

## 2022-06-01 DIAGNOSIS — R197 Diarrhea, unspecified: Secondary | ICD-10-CM

## 2022-06-01 NOTE — Telephone Encounter (Signed)
Patient aware.

## 2022-06-02 DIAGNOSIS — R197 Diarrhea, unspecified: Secondary | ICD-10-CM | POA: Diagnosis not present

## 2022-06-03 ENCOUNTER — Other Ambulatory Visit: Payer: Self-pay | Admitting: Family Medicine

## 2022-06-03 DIAGNOSIS — E1159 Type 2 diabetes mellitus with other circulatory complications: Secondary | ICD-10-CM

## 2022-06-03 LAB — MAGNESIUM: Magnesium: 1.5 mg/dL — ABNORMAL LOW (ref 1.6–2.3)

## 2022-06-03 LAB — BMP8+EGFR
BUN/Creatinine Ratio: 12 (ref 12–28)
BUN: 11 mg/dL (ref 8–27)
CO2: 24 mmol/L (ref 20–29)
Calcium: 8.8 mg/dL (ref 8.7–10.3)
Chloride: 99 mmol/L (ref 96–106)
Creatinine, Ser: 0.89 mg/dL (ref 0.57–1.00)
Glucose: 191 mg/dL — ABNORMAL HIGH (ref 70–99)
Potassium: 4.4 mmol/L (ref 3.5–5.2)
Sodium: 139 mmol/L (ref 134–144)
eGFR: 67 mL/min/{1.73_m2} (ref >59)

## 2022-06-04 ENCOUNTER — Telehealth: Payer: Self-pay | Admitting: Family Medicine

## 2022-06-04 NOTE — Telephone Encounter (Signed)
Pt returning call     If unable to reach please call back after 3

## 2022-06-04 NOTE — Telephone Encounter (Signed)
[id number] 

## 2022-06-06 ENCOUNTER — Encounter: Payer: Self-pay | Admitting: Family Medicine

## 2022-06-06 DIAGNOSIS — Z9189 Other specified personal risk factors, not elsewhere classified: Secondary | ICD-10-CM | POA: Insufficient documentation

## 2022-06-06 DIAGNOSIS — R197 Diarrhea, unspecified: Secondary | ICD-10-CM | POA: Insufficient documentation

## 2022-06-06 DIAGNOSIS — Z09 Encounter for follow-up examination after completed treatment for conditions other than malignant neoplasm: Secondary | ICD-10-CM | POA: Insufficient documentation

## 2022-06-06 NOTE — Assessment & Plan Note (Signed)
Accucheck in office within normal

## 2022-06-06 NOTE — Progress Notes (Signed)
   Jennifer Blackburn     MRN: [id number]      DOB: 08-Aug-1945   HPI Jennifer Blackburn is here f/o loose watery stool x 6 days, poor appetite, light headed, weak.Cramping stomach, little improvement overall and still very light headed , lying in bed all week , little oral intake.Has nausea, no vomit , intermittent chills, actually feels cold most of the time, no fever, denies burning or frequency of urination Took ozempic the day before symptom onset ,however n this chronically No close contact ill Postponing annual exam, too ill today Was in ED on 05/25/2022 because of these symptoms ROS  Denies sinus pressure, nasal congestion, ear pain or sore throat. Denies chest congestion, productive cough or wheezing. Denies chest pains, palpitations and leg swelling  Denies joint pain, swelling and limitation in mobility. Denies headaches, seizures, numbness, or tingling. Denies depression, anxiety or insomnia. Denies skin break down or rash.   PE  BP 110/60   Pulse 98   Resp 12   Ht 5\' 5"  (1.651 m)   Wt 171 lb 0.6 oz (77.6 kg)   LMP 11/11/2016   SpO2 98%   BMI 28.46 kg/m   Patient alert and oriented and in no cardiopulmonary distress.Ill appearing  HEENT: No facial asymmetry, EOMI,     Neck supple .  Chest: Clear to auscultation bilaterally.  CVS: S1, S2 no murmurs, no S3.Regular rate.  ABD: Soft diffuse generalized tenderness, no guarding or rebound, hyperactive bowel sounds  Ext: No edema  MS: Adequate  though derceased ROM spine, shoulders, hips and knees.  Skin: Intact, no ulcerations or rash noted.  Psych: Good eye contact, normal affect. Memory intact not anxious or depressed appearing.  CNS: CN 2-12 intact, power,  normal throughout.no focal deficits noted.   Assessment & Plan  Diarrhea of presumed infectious origin Symptomatic x 6 days, little to no improvement, based on clinical presentation of excessive light headedness and fatigue, pt sent back to ED for re eval,  concerned especially about electrolyte status and hydration   At risk for abnormal blood glucose level Accucheck in office within normal  Encounter for examination following treatment at hospital Patient in for follow up of recent ED visit. On 05/25/2022 Discharge summary, and laboratory and radiology data are reviewed, and any questions or concerns  are discussed. Specific issues requiring follow up are specifically addressed.

## 2022-06-06 NOTE — Assessment & Plan Note (Addendum)
Patient in for follow up of recent ED visit. On 05/25/2022 Discharge summary, and laboratory and radiology data are reviewed, and any questions or concerns  are discussed. Specific issues requiring follow up are specifically addressed.

## 2022-06-06 NOTE — Assessment & Plan Note (Addendum)
Symptomatic x 6 days, little to no improvement, based on clinical presentation of excessive light headedness and fatigue, pt sent back to ED for re eval, concerned especially about electrolyte status and hydration

## 2022-06-08 ENCOUNTER — Ambulatory Visit: Payer: Medicare HMO | Admitting: Internal Medicine

## 2022-06-08 ENCOUNTER — Telehealth: Payer: Self-pay

## 2022-06-08 ENCOUNTER — Encounter: Payer: Self-pay | Admitting: Internal Medicine

## 2022-06-08 VITALS — BP 130/80 | HR 79 | Ht 65.0 in | Wt 176.8 lb

## 2022-06-08 DIAGNOSIS — E1159 Type 2 diabetes mellitus with other circulatory complications: Secondary | ICD-10-CM | POA: Diagnosis not present

## 2022-06-08 DIAGNOSIS — E890 Postprocedural hypothyroidism: Secondary | ICD-10-CM | POA: Diagnosis not present

## 2022-06-08 DIAGNOSIS — C73 Malignant neoplasm of thyroid gland: Secondary | ICD-10-CM

## 2022-06-08 LAB — POCT GLYCOSYLATED HEMOGLOBIN (HGB A1C): Hemoglobin A1C: 9.4 % — AB (ref 4.0–5.6)

## 2022-06-08 NOTE — Progress Notes (Signed)
Patient ID: Jennifer Blackburn, female   DOB: 1945/07/10, 78 y.o.   MRN: [id number]   HPI: Jennifer Blackburn is a 77 y.o.-year-old female, returning for follow-up for DM2, dx in 2001, insulin-dependent since 2017, uncontrolled, with long-term complications (DR, CAD) and also papillary thyroid cancer and postsurgical hypothyroidism.  Last visit 5 months ago.  Interim history: No increased urination, blurry vision, chest pain. At the beginning of this month, she had gastroenteritis and continued to have diarrhea/nausea/vomiting. She was very dehydrated. She had a CT scan: diverticulitis, GB stones and kidney stones.  Now off Ozempic for 1 week. She drinks lemonade (regular) throughout the day.  DM2: Reviewed HbA1c levels: Lab Results  Component Value Date   HGBA1C 9.1 (A) 12/31/2021   HGBA1C 8.8 (A) 08/27/2021   HGBA1C 8.8 (A) 04/28/2021   HGBA1C 10.1 (A) 12/23/2020   HGBA1C 9.0 (A) 08/07/2020   HGBA1C 10.5 (A) 05/09/2020   HGBA1C 9.7 (A) 01/04/2020   HGBA1C 8.4 (A) 08/29/2019   HGBA1C 9.3 (A) 05/03/2019   HGBA1C 8.8 (A) 12/28/2018  01/12/2018: HbA1c 8.7% 08/05/2017: HbA1c 8.9% 07/11/2017: HbA1c 9.1%  Pt is on a regimen of: - Metformin 1000 mg 2x a day with meals. - Toujeo 30 units in a.m. and 50 units at bedtime >> 30 units in a.m. and 40 units at bedtime - Lispro 8-10 >> 10 mg 3x a day 15 min before dinner >> NovoLog 10 -15 units before/after each meal >> not consistently - Ozempic 0.5 >> off >> 1 >> 2 mg weekly (through the patient assistance program)  She was on Ozempic 0.5 mg weekly in a.m. >> 1 mg  - constipation >> stopped 06/2018.  We added it back 09/2018. She was on Victoza >> $$$. She was on Jardiance >> $$$. She was on Actos >> swelling - stopped Spring 2019. She was on Glipizide XL 5 mg daily >> stopped 07/2017.  We retried glipizide 5 mg twice a day but had to stop 12/2018 due to lack of effect.  Pt checks her sugars 1-2 times a day: - am: 180-200 >> 120-130 >> 96,  110-160 >> 120-201 >> 110-180 - 2h after b'fast: n/c - before lunch: n/c - 2h after lunch: 265-300s >> n/c >> 200s >> 200s >> n/c >> 130-180 >> n/c - before dinner: n/c >> 185-190 >> n/c - 2h after dinner: n/c >> 150-188 >> n/c - bedtime:  160-180 >> 170-180s >> 160s >> 140-210 >> n/c - nighttime: n/c  Lowest sugar was 170 >> 86 >>90s >> 110; she has hypoglycemia awareness in the 60s. Highest sugar was 200 >> 180s >> 200s >> 200s  Glucometer: AccuChek Aviva  Pt's meals are: - Breakfast:toast, cereal, milk; oatmeal; bacon + egg >> oatmeal + coffee >> cereals - Lunch: skips >> 2 eggs, fruit - Dinner: hamburger, or chicken, or fish, + veggies, bread, water and tea - Snacks: 2- fruit She was seeing a dietitian in [place].  -No CKD, last BUN/creatinine:  Lab Results  Component Value Date   BUN 11 06/02/2022   BUN 9 05/28/2022   CREATININE 0.89 06/02/2022   CREATININE 0.90 05/28/2022  On quinapril.  -+ HL; last set of lipids: Lab Results  Component Value Date   CHOL 162 03/04/2022   HDL 46 03/04/2022   LDLCALC 91 03/04/2022   TRIG 141 03/04/2022   CHOLHDL 3.5 03/04/2022  On pravastatin 80.  - last eye exam was 04/08/2022: + DR . Dr. Eulas Post in [place].  She has  cataracts OU.   -No numbness and tingling in her feet. Has a podiatrist.  Last foot exam  12/2021.  On ASA 81.  Pt has no FH of DM.  H/o Thyroid cancer (2001), s/p RAI treatment, now with postsurgical hypothyroidism.  Previously on brand-name Synthroid, now generic levothyroxine.  Pt is on levothyroxine 125 mcg daily (dose decreased 08/2021), taken: - in am - fasting - at least 30 min from b'fast - no calcium - no iron - no multivitamins - + PPIs (Protonix) moved to pm - not on Biotin  Reviewed her TFTs: Lab Results  Component Value Date   TSH 0.72 12/31/2021   TSH 0.06 (L) 08/27/2021   TSH 0.40 08/07/2020   TSH 1.60 05/09/2020   TSH 0.05 (L) 08/31/2019   TSH 0.04 (L) 12/28/2018   TSH 0.06  (L) 09/21/2018   TSH 0.61 05/26/2018   TSH 2.49 03/23/2016   TSH 0.070 (L) 10/13/2010   TSH 0.035 (L) 09/22/2006   TSH 0.015 (L) 06/17/2006  11/03/2017: TSH 0.044  Her thyroglobulin levels are detectable: Lab Results  Component Value Date   THYROGLB 0.3 (L) 08/27/2021   THYROGLB 0.4 (L) 08/07/2020   THYROGLB 1.2 (L) 05/09/2020   THYROGLB 0.4 (L) 08/31/2019   THYROGLB 0.3 (L) 09/21/2018   THYROGLB 0.7 (L) 05/26/2018   Lab Results  Component Value Date   THGAB <1 08/27/2021   THGAB <1 08/07/2020   THGAB <1 05/09/2020   THGAB <1 08/31/2019   THGAB <1 09/21/2018   THGAB <1 05/26/2018   Reviewed records from Dr. Ronnald Collum: Patient is status post total thyroidectomy in 2004 1.3 cm papillary thyroid cancer of the right lobe, followed by 3x RAI tx's with  33 mCi I-131 and a fourth RAI dose inpatient: 125 mCi, at [facility].    Whole-body scan on 09/08/2007 showed an increase in the thyroid bed activity and the follow up study with PET scan 10/08/2007 showed mildly hypermetabolic cervical lymph nodes.    Repeat PET 05/10/2008 showed no significant changes felt likely to be due to to a reactive process.  11/03/2017: TSH 0.044, thyroglobulin 0.2, ATA <1.0  04/05/2017: TSH 0.021 (0.45-4.5), thyroglobulin 0.3 (by IMA), ATA <1.0  Reviewed imaging test reports in the system: Thyroid ultrasound (06/10/2018): 1.1 cm nonspecific calcified lesion in the right upper neck.  CT was recommended: There is no residual or recurrent tissue in the right or left thyroid beds. There is no evidence of abnormal adenopathy by short axis diameter measurement criteria There is a nonspecific calcified soft tissue mass measuring 1.1 cm in the right superior neck. This is of unknown significance. CT neck can be performed to further characterize.  CT (10/18/2018): Status post thyroidectomy. No soft tissue mass within the thyroidectomy bed. A 13 x 7 mm ovoid focus of calcification in the right aspect of the thyroidectomy  bed likely corresponding with the finding on recent neck ultrasound and is unchanged as compared to neck CT 10/24/2007, benign.   No pathologically enlarged cervical chain lymph nodes. A nonenlarged calcified right supraclavicular lymph node is new from prior neck CT 10/24/2007 but also favored benign. Attention recommended on follow-up.  Neck U/S (06/03/2020): Ultrasound of the neck demonstrates no evidence of visible residual thyroid tissue or abnormal soft tissue mass.   Ovoid area of densely shadowing calcification again noted to the right of midline as seen by prior ultrasound and also demonstrated by prior CT studies of the neck with documented stability between 2009 and 2020 studies.  Small left cervical lymph node has a similar appearance to the prior ultrasound study measuring 0.5 cm in short axis.   IMPRESSION: No evidence by ultrasound of recurrent thyroid malignancy in the neck.  Stable right neck calcification which has been documented to be benign and stable by prior CT.  Small left cervical lymph node demonstrates stable appearance and size by ultrasound  compared to the 2020 ultrasound.  Pt denies: - feeling nodules in neck - hoarseness - dysphagia - choking  She also has a history of HTN and anemia.  ROS: + See HPI  I reviewed pt's medications, allergies, PMH, social hx, family hx, and changes were documented in the history of present illness. Otherwise, unchanged from my initial visit note.  Past Medical History:  Diagnosis Date   Anxiety    Arthritis    CAD (coronary artery disease)    Depression    Diabetes mellitus type II    without complication   DJD (degenerative joint disease) of lumbar spine    Hypercholesteremia    Hyperlipidemia    Hypertension    benign    Hypothyroidism    Thyroid cancer ([place]) 2001   Past Surgical History:  Procedure Laterality Date   ABDOMINAL HYSTERECTOMY     CARDIAC CATHETERIZATION     CATARACT EXTRACTION  W/PHACO Right 07/17/2012   Procedure: CATARACT EXTRACTION PHACO AND INTRAOCULAR LENS PLACEMENT ([place]);  Surgeon: Tonny Branch, MD;  Location: [facility];  Service: Ophthalmology;  Laterality: Right;  CDE:25.51   CATARACT EXTRACTION W/PHACO Left 09/17/2016   Procedure: CATARACT EXTRACTION PHACO AND INTRAOCULAR LENS PLACEMENT LEFT EYE;  Surgeon: Tonny Branch, MD;  Location: [facility];  Service: Ophthalmology;  Laterality: Left;  CDE: 19.23   COLONOSCOPY     COLONOSCOPY N/A 01/15/2014   Procedure: COLONOSCOPY;  Surgeon: Daneil Dolin, MD;  Location: [facility] SUITE;  Service: Endoscopy;  Laterality: N/A;  9:00 AM   COLONOSCOPY WITH PROPOFOL N/A 01/25/2019   Procedure: COLONOSCOPY WITH PROPOFOL;  Surgeon: Daneil Dolin, MD; diverticulosis in the entire colon, otherwise normal exam.  No repeat colonoscopy due to age.   DOPPLER ECHOCARDIOGRAPHY     ESOPHAGOGASTRODUODENOSCOPY (EGD) WITH PROPOFOL N/A 01/25/2019   Procedure: ESOPHAGOGASTRODUODENOSCOPY (EGD) WITH PROPOFOL;  Surgeon: Daneil Dolin, MD; normal esophagus without dilation due to inability to pass dilator beyond the hypopharynx, 1 small nonbleeding gastric ulcer s/p biopsied, otherwise normal exam.  Pathology with ulcer with reactive changes, no H. pylori, metaplasia, dysplasia, or malignancy.   ESOPHAGOGASTRODUODENOSCOPY (EGD) WITH PROPOFOL N/A 08/21/2019   Procedure: ESOPHAGOGASTRODUODENOSCOPY (EGD) WITH PROPOFOL;  Surgeon: Daneil Dolin, MD; Normal esophagus. Previously noted gastric ulcer completely healed. Normal examined duodenum.    JOINT REPLACEMENT  07/01/2010   left hip   KNEE SURGERY Right    arthroscopy   left hip replaced  07/01/2010   SPINE SURGERY  2006   cervical   stress dipyridamole myocardial perfusion     THYROIDECTOMY     TONSILLECTOMY     VESICOVAGINAL FISTULA CLOSURE W/ TAH     Social History   Socioeconomic History   Marital status: Married    Spouse name: Not on file   Number of children: 2   Years of education: 14    Highest education level: Not on file  Occupational History   Occupation: retired     Comment: bank  Tobacco Use   Smoking status: Never    Passive exposure: Yes   Smokeless tobacco: Never   Tobacco comments:  Husband smokes in the home  Vaping Use   Vaping Use: Never used  Substance and Sexual Activity   Alcohol use: No   Drug use: No   Sexual activity: Never  Other Topics Concern   Not on file  Social History Narrative   Lives with husband, at home   No caffeine   Social Determinants of Health   Financial Resource Strain: Low Risk  (08/31/2021)   Overall Financial Resource Strain (CARDIA)    Difficulty of Paying Living Expenses: Not hard at all  Food Insecurity: No Food Insecurity (08/31/2021)   Hunger Vital Sign    Worried About Running Out of Food in the Last Year: Never true    Ran Out of Food in the Last Year: Never true  Transportation Needs: No Transportation Needs (08/31/2021)   PRAPARE - Hydrologist (Medical): No    Lack of Transportation (Non-Medical): No  Physical Activity: Insufficiently Active (08/31/2021)   Exercise Vital Sign    Days of Exercise per Week: 7 days    Minutes of Exercise per Session: 10 min  Stress: No Stress Concern Present (08/31/2021)   [place]    Feeling of Stress : Not at all  Social Connections: [place] (08/31/2021)   Social Connection and Isolation Panel [NHANES]    Frequency of Communication with Friends and Family: More than three times a week    Frequency of Social Gatherings with Friends and Family: More than three times a week    Attends Religious Services: More than 4 times per year    Active Member of [employer] or Organizations: Yes    Attends Archivist Meetings: 1 to 4 times per year    Marital Status: Married  Human resources officer Violence: Not At Risk (08/31/2021)   Humiliation, Afraid, Rape, and Kick questionnaire     Fear of Current or Ex-Partner: No    Emotionally Abused: No    Physically Abused: No    Sexually Abused: No   Current Outpatient Medications  Medication Sig Dispense Refill   ACCU-CHEK GUIDE test strip USE AS INSTRUCTED TO CHECK BLOOD SUGAR 2 TIMES PER DAY DX CODE E11.65 100 strip 3   Cetirizine HCl 10 MG TBDP Take one tablet by mouth two times daily for allergy (Patient taking differently: Take 1 tablet by mouth daily.) 60 tablet 5   Cholecalciferol (VITAMIN D3) 50 MCG (2000 UT) TABS Take 2,000 Units by mouth daily.     gabapentin (NEURONTIN) 100 MG capsule Take one capsule twice daily and three at bedtime 150 capsule 1   hydrOXYzine (ATARAX) 10 MG tablet Take one tablet twice daily and two tablets at bedtime as needed for generalized itching. 360 tablet 1   imipramine (TOFRANIL) 50 MG tablet TAKE 1 TABLET AT BEDTIME 90 tablet 2   insulin aspart (NOVOLOG FLEXPEN) 100 UNIT/ML FlexPen Inject 10-15 Units into the skin 3 (three) times daily with meals. (Patient taking differently: Inject 15 Units into the skin 3 (three) times daily with meals.) 15 mL 2   Insulin Pen Needle 32G X 4 MM MISC Use 4x a day 300 each 3   isosorbide mononitrate (IMDUR) 30 MG 24 hr tablet TAKE HALF A TABLET BY MOUTH EVERY DAY 45 tablet 3   Lancets (ACCU-CHEK MULTICLIX) lancets Use as instructed three times daily dx 250.01 100 each 5   levothyroxine (SYNTHROID) 125 MCG tablet TAKE 1 TABLET BY MOUTH EVERY DAY BEFORE  BREAKFAST 90 tablet 1   metFORMIN (GLUCOPHAGE) 1000 MG tablet TAKE 1 TABLET TWICE DAILY 180 tablet 1   metoprolol tartrate (LOPRESSOR) 50 MG tablet TAKE 1 TABLET BY MOUTH TWICE A DAY (Patient taking differently: Take 25 mg by mouth in the morning and at bedtime.) 180 tablet 3   ondansetron (ZOFRAN-ODT) 8 MG disintegrating tablet 8mg  ODT q4 hours prn nausea 10 tablet 0   pantoprazole (PROTONIX) 40 MG tablet Take 1 tablet (40 mg total) by mouth daily. 30 tablet 1   PARoxetine (PAXIL) 20 MG tablet TAKE 1 TABLET  EVERY DAY 90 tablet 3   potassium chloride (KLOR-CON) 10 MEQ tablet Take 2 tablets (20 mEq total) by mouth daily. 10 tablet 0   pravastatin (PRAVACHOL) 80 MG tablet TAKE 1 TABLET EVERY DAY 90 tablet 3   Semaglutide, 2 MG/DOSE, (OZEMPIC, 2 MG/DOSE,) 8 MG/3ML SOPN Inject into the skin.     TOUJEO SOLOSTAR 300 UNIT/ML Solostar Pen INJECT 30-50 UNITS TWO TIMES DAILY 9 mL 1   No current facility-administered medications for this visit.     Allergies  Allergen Reactions   Lipitor [Atorvastatin Calcium] Other (See Comments)    Muscle aches   Actos [Pioglitazone] Other (See Comments)    Peripheral edema   Crestor [Rosuvastatin] Other (See Comments)    Muscle aches   Daypro [Oxaprozin] Hives   Nsaids Hives   Sulfonamide Derivatives Hives   Family History  Problem Relation Age of Onset   Heart attack Father    Heart failure Mother    Asthma Daughter    Sleep apnea Son        CPAP   Colon cancer Neg Hx    PE: BP 130/80 (BP Location: Left Arm, Patient Position: Sitting, Cuff Size: Normal)   Pulse 79   Ht 5\' 5"  (1.651 m)   Wt 176 lb 12.8 oz (80.2 kg)   LMP 11/11/2016   SpO2 95%   BMI 29.42 kg/m  Wt Readings from Last 3 Encounters:  06/08/22 176 lb 12.8 oz (80.2 kg)  05/28/22 171 lb (77.6 kg)  05/28/22 171 lb 0.6 oz (77.6 kg)   Constitutional: overweight, in NAD Eyes: no exophthalmos ENT: no neck masses palpated, no cervical lymphadenopathy Cardiovascular: RRR, No MRG Respiratory: CTA B Musculoskeletal: no deformities Skin:  no rashes Neurological: no tremor with outstretched hands   ASSESSMENT: 1. DM2, insulin-dependent, uncontrolled, with long-term complications - CAD - DR  2.  Papillary thyroid cancer  3.  Postsurgical hypothyroidism  PLAN:  1. Patient with longstanding, uncontrolled, type 2 diabetes, on metformin, basal/bolus insulin regimen and weekly GLP-1 receptor agonist, with still poor control.  At last visit, HbA1c was higher, at 9.1%.  However, an HbA1c  calculated from fructosamine was actually lower, at 7.47%.  At that time, sugars were above target in the morning but they were not that high later in the day.  Was missing many NovoLog injections and we discussed about trying to take this consistently.  I also advised her to stop eating fruit at night, after dinner, but we otherwise continued the rest of the regimen.  I recommended a referral to nutrition.  She did not see the nutritionist yet. -She recently had an episode of gastroenteritis with nausea/vomiting/diarrhea.  Lipase was normal.  She is off the Ozempic and I advised her to hold off starting it for now until she feels back to baseline.  At that time, I advised her to start at a lower dose, 36 clicks (1 mg). -  Sugars at home are usually higher than target in the morning if she is not checking later in the day.  She is inconsistent with insulin doses.  This is probably the reason why her HbA1c is very high today despite taking an apparent high dose of insulin.  We did discuss about the possibility of using the CeQur simplicity insulin pump and gave her a handout to look this up and to see if her insurance covers it.  However, she tells me that she drinks lemonade throughout the day.  This has a lot of sugar.  We discussed about the absolute importance of stopping this.  She is determined to do so.  In this case, we most likely do not need to change her regimen and in fact, we may need to start reducing her insulin doses. - I suggested to:  Patient Instructions  Please continue: - Metformin 1000 mg 2x a day with meals. - Toujeo 30 units in a.m. and 40 units at bedtime - Novolog 10-15 units before each meal   Stay off: - Ozempic 2 mg weekly - until you feel better  STOP lemonade!  Please look up the Cequr Simplicity pump.  Please continue levothyroxine 125 mcg daily.  Take the thyroid hormone every day, with water, at least 30 minutes before breakfast, separated by at least 4 hours  from: - acid reflux medications - calcium - iron - multivitamins  Please return in 3-4 months with your sugar log.  - we checked her HbA1c: 9.4% (higher) - advised to check sugars at different times of the day - 3-4x a day, rotating check times - advised for yearly eye exams >> she is UTD - return to clinic in 3-4 months   2.  Papillary thyroid cancer -Previous thyroid cancer records were reviewed from Dr. Ronnald Collum -She has metastatic thyroid cancer and had 4 RAI treatments.  She also had increased signal on the PET scan from 2009, however, in 2010, another PET scan showed possible inflammatory lymph nodes in the area only -Her thyroglobulin levels were detectable but stable in the past.  She had a high level in 04/2020, but then returns to baseline. -On the latest neck ultrasound from 05/2020, there were no new concerning masses.  Previous neck ultrasound also showed a specific calcified region in the right upper neck and a 2019 neck CT showed that the calcified mass was stable, most likely benign -At last check, her thyroglobulin was detectable but decreasing, and ATA antibodies are undetectable -We will recheck her thyroglobulin and ATA at next OV  3.  Postsurgical hypothyroidism - latest thyroid labs reviewed with pt. >> normal: Lab Results  Component Value Date   TSH 0.72 12/31/2021  - she continues on LT4 125 mcg daily - pt feels good on this dose. - we discussed about taking the thyroid hormone every day, with water, >30 minutes before breakfast, separated by >4 hours from acid reflux medications, calcium, iron, multivitamins. Pt. is taking it correctly. - will check thyroid tests at next OV  Philemon Kingdom, MD PhD [facility]

## 2022-06-08 NOTE — Patient Instructions (Addendum)
Please continue: - Metformin 1000 mg 2x a day with meals. - Toujeo 30 units in a.m. and 40 units at bedtime - Novolog 10-15 units before each meal   Stay off: - Ozempic 2 mg weekly - until you feel better  STOP lemonade!  Please look up the Cequr Simplicity pump.  Please continue levothyroxine 125 mcg daily.  Take the thyroid hormone every day, with water, at least 30 minutes before breakfast, separated by at least 4 hours from: - acid reflux medications - calcium - iron - multivitamins  Please return in 3-4 months with your sugar log.

## 2022-06-08 NOTE — Telephone Encounter (Signed)
Provider request form be completed and faxed with clinical notes. DME supplies ordered via [employer] through online portal. Libre [facility] with [place]

## 2022-06-11 ENCOUNTER — Encounter: Payer: Self-pay | Admitting: Family Medicine

## 2022-06-11 ENCOUNTER — Ambulatory Visit (INDEPENDENT_AMBULATORY_CARE_PROVIDER_SITE_OTHER): Payer: Medicare HMO | Admitting: Family Medicine

## 2022-06-11 VITALS — BP 122/72 | HR 79 | Ht 65.0 in | Wt 177.0 lb

## 2022-06-11 DIAGNOSIS — R112 Nausea with vomiting, unspecified: Secondary | ICD-10-CM | POA: Diagnosis not present

## 2022-06-11 DIAGNOSIS — E663 Overweight: Secondary | ICD-10-CM | POA: Diagnosis not present

## 2022-06-11 DIAGNOSIS — R197 Diarrhea, unspecified: Secondary | ICD-10-CM

## 2022-06-11 DIAGNOSIS — E1159 Type 2 diabetes mellitus with other circulatory complications: Secondary | ICD-10-CM

## 2022-06-11 DIAGNOSIS — I10 Essential (primary) hypertension: Secondary | ICD-10-CM

## 2022-06-11 DIAGNOSIS — E559 Vitamin D deficiency, unspecified: Secondary | ICD-10-CM | POA: Diagnosis not present

## 2022-06-11 DIAGNOSIS — M791 Myalgia, unspecified site: Secondary | ICD-10-CM

## 2022-06-11 DIAGNOSIS — Z09 Encounter for follow-up examination after completed treatment for conditions other than malignant neoplasm: Secondary | ICD-10-CM | POA: Diagnosis not present

## 2022-06-11 DIAGNOSIS — E782 Mixed hyperlipidemia: Secondary | ICD-10-CM | POA: Diagnosis not present

## 2022-06-11 NOTE — Assessment & Plan Note (Signed)
Hyperlipidemia:Low fat diet discussed and encouraged.   Lipid Panel  Lab Results  Component Value Date   CHOL 162 03/04/2022   HDL 46 03/04/2022   LDLCALC 91 03/04/2022   TRIG 141 03/04/2022   CHOLHDL 3.5 03/04/2022     Updated lab needed at/ before next visit.

## 2022-06-11 NOTE — Progress Notes (Signed)
Jennifer Blackburn     MRN: [id number]      DOB: [birth year]   HPI Jennifer Blackburn is here for follow up  of ED visit on 03/08 when she presented with persistent n/v , and low potassium, and also for  re-evaluation of chronic medical conditions, medication management and review of any available recent lab and radiology data.  Preventive health is updated, specifically  Cancer screening and Immunization.   Questions or concerns regarding consultations or procedures which the PT has had in the interim are  addressed.trying y get CGM for management of uncontrolled diabetes, also ozempic dose reduced due to chronic nausea Blood sugar still uncontrolled , has seen Endo since last visit here, and has upcoming Cardiology appt    ROS Denies recent fever or chills. Denies sinus pressure, nasal congestion, ear pain or sore throat. Denies chest congestion, productive cough or wheezing. Denies chest pains, palpitations and leg swelling Denies abdominal pain, nausea, vomiting,diarrhea or constipation.   Denies dysuria, frequency, hesitancy or incontinence. Denies joint pain, swelling and limitation in mobility. Denies headaches, seizures, numbness, or tingling. Denies depression, anxiety or insomnia. Denies skin break down or rash.   PE  BP 122/72 (BP Location: Right Arm, Patient Position: Sitting, Cuff Size: Large)   Pulse 79   Ht 5\' 5"  (1.651 m)   Wt 177 lb 0.6 oz (80.3 kg)   LMP 11/11/2016   SpO2 92%   BMI 29.46 kg/m   Patient alert and oriented and in no cardiopulmonary distress.  HEENT: No facial asymmetry, EOMI,     Neck supple .  Chest: Clear to auscultation bilaterally.  CVS: S1, S2 no murmurs, no S3.Regular rate.  ABD: Soft non tender.   Ext: No edema  MS: Adequate ROM spine, shoulders, hips and knees.  Skin: Intact, no ulcerations or rash noted.  Psych: Good eye contact, normal affect. Memory intact not anxious or depressed appearing.  CNS: CN 2-12 intact, power,  normal  throughout.no focal deficits noted.   Assessment & Plan  Nausea and vomiting Resolved, and nausea improved also due to lower diose of ozempic  Diarrhea of presumed infectious origin resolved  Encounter for examination following treatment at hospital Patient in for follow up of recent Ed visit .Pt feels 100 better and about 75 % back to baseline Discharge summary, and laboratory and radiology data are reviewed, and any questions or concerns  are discussed. Specific issues requiring follow up are specifically addressed.   Hyperlipidemia Hyperlipidemia:Low fat diet discussed and encouraged.   Lipid Panel  Lab Results  Component Value Date   CHOL 162 03/04/2022   HDL 46 03/04/2022   LDLCALC 91 03/04/2022   TRIG 141 03/04/2022   CHOLHDL 3.5 03/04/2022     Updated lab needed at/ before next visit.   Overweight (BMI 25.0-29.9)  Patient re-educated about  the importance of commitment to a  minimum of 150 minutes of exercise per week as able.  The importance of healthy food choices with portion control discussed, as well as eating regularly and within a 12 hour window most days. The need to choose "clean , green" food 50 to 75% of the time is discussed, as well as to make water the primary drink and set a goal of 64 ounces water daily.       06/11/2022   10:31 AM 06/08/2022    2:00 PM 05/28/2022    4:09 PM  Weight /BMI  Weight 177 lb 0.6 oz 176 lb 12.8 oz  171 lb  Height 5\' 5"  (1.651 m) 5\' 5"  (1.651 m) 5\' 5"  (1.651 m)  BMI 29.46 kg/m2 29.42 kg/m2 28.46 kg/m2      Type 2 diabetes mellitus with vascular disease (HCC) Uncontrolled, managed by endo, recent med adjustments and trying to cGM which she neds Jennifer Blackburn is reminded of the importance of commitment to daily physical activity for 30 minutes or more, as able and the need to limit carbohydrate intake to 30 to 60 grams per meal to help with blood sugar control.   The need to take medication as prescribed, test blood  sugar as directed, and to call between visits if there is a concern that blood sugar is uncontrolled is also discussed.   Jennifer Blackburn is reminded of the importance of daily foot exam, annual eye examination, and good blood sugar, blood pressure and cholesterol control.     Latest Ref Rng & Units 06/08/2022    2:12 PM 06/02/2022   12:05 PM 05/28/2022    6:02 PM 05/25/2022    2:22 AM 03/04/2022   11:14 AM  Diabetic Labs  HbA1c 4.0 - 5.6 % 9.4       Chol 100 - 199 mg/dL     162   HDL >39 mg/dL     46   Calc LDL 0 - 99 mg/dL     91   Triglycerides 0 - 149 mg/dL     141   Creatinine 0.57 - 1.00 mg/dL  0.89  0.90  0.89  0.84       06/11/2022   10:31 AM 06/08/2022    2:00 PM 05/28/2022   11:00 PM 05/28/2022    9:30 PM 05/28/2022    8:33 PM 05/28/2022    6:45 PM 05/28/2022    4:09 PM  BP/Weight  Systolic BP [id number] [id number] [id number]  Diastolic BP 72 80 71 69 73 73 66  Wt. (Lbs) 177.04 176.8     171  BMI 29.46 kg/m2 29.42 kg/m2     28.46 kg/m2      Latest Ref Rng & Units 04/08/2022   12:00 AM 12/31/2021    1:00 PM  Foot/eye exam completion dates  Eye Exam No Retinopathy Retinopathy       Foot Form Completion   Done     This result is from an external source.

## 2022-06-11 NOTE — Assessment & Plan Note (Signed)
Uncontrolled, managed by endo, recent med adjustments and trying to cGM which she neds Ms. Kasim is reminded of the importance of commitment to daily physical activity for 30 minutes or more, as able and the need to limit carbohydrate intake to 30 to 60 grams per meal to help with blood sugar control.   The need to take medication as prescribed, test blood sugar as directed, and to call between visits if there is a concern that blood sugar is uncontrolled is also discussed.   Ms. Koupal is reminded of the importance of daily foot exam, annual eye examination, and good blood sugar, blood pressure and cholesterol control.     Latest Ref Rng & Units 06/08/2022    2:12 PM 06/02/2022   12:05 PM 05/28/2022    6:02 PM 05/25/2022    2:22 AM 03/04/2022   11:14 AM  Diabetic Labs  HbA1c 4.0 - 5.6 % 9.4       Chol 100 - 199 mg/dL     162   HDL >39 mg/dL     46   Calc LDL 0 - 99 mg/dL     91   Triglycerides 0 - 149 mg/dL     141   Creatinine 0.57 - 1.00 mg/dL  0.89  0.90  0.89  0.84       06/11/2022   10:31 AM 06/08/2022    2:00 PM 05/28/2022   11:00 PM 05/28/2022    9:30 PM 05/28/2022    8:33 PM 05/28/2022    6:45 PM 05/28/2022    4:09 PM  BP/Weight  Systolic BP [id number] 123456 [id number] [id number]  Diastolic BP 72 80 71 69 73 73 66  Wt. (Lbs) 177.04 176.8     171  BMI 29.46 kg/m2 29.42 kg/m2     28.46 kg/m2      Latest Ref Rng & Units 04/08/2022   12:00 AM 12/31/2021    1:00 PM  Foot/eye exam completion dates  Eye Exam No Retinopathy Retinopathy       Foot Form Completion   Done     This result is from an external source.

## 2022-06-11 NOTE — Assessment & Plan Note (Signed)
Patient in for follow up of recent Ed visit .Pt feels 100 better and about 75 % back to baseline Discharge summary, and laboratory and radiology data are reviewed, and any questions or concerns  are discussed. Specific issues requiring follow up are specifically addressed.

## 2022-06-11 NOTE — Assessment & Plan Note (Signed)
Resolved, and nausea improved also due to lower diose of ozempic

## 2022-06-11 NOTE — Assessment & Plan Note (Signed)
resolved 

## 2022-06-11 NOTE — Patient Instructions (Addendum)
Annual exam in 4 months, call if you need me sooner  Urine ACR today   Take magnesium every other day  Fasting lipid, cmp and EGFr and magnesium and vit D 3 to 5 days before next appt  Need RSV  Shingrix vaccines  TdAP  Covid Vaccine  All these vaccines that you need you get at your pharmacy  Thanks for choosing [facility], we consider it a privelige to serve you.

## 2022-06-11 NOTE — Assessment & Plan Note (Signed)
  Patient re-educated about  the importance of commitment to a  minimum of 150 minutes of exercise per week as able.  The importance of healthy food choices with portion control discussed, as well as eating regularly and within a 12 hour window most days. The need to choose "clean , green" food 50 to 75% of the time is discussed, as well as to make water the primary drink and set a goal of 64 ounces water daily.       06/11/2022   10:31 AM 06/08/2022    2:00 PM 05/28/2022    4:09 PM  Weight /BMI  Weight 177 lb 0.6 oz 176 lb 12.8 oz 171 lb  Height 5\' 5"  (1.651 m) 5\' 5"  (1.651 m) 5\' 5"  (1.651 m)  BMI 29.46 kg/m2 29.42 kg/m2 28.46 kg/m2

## 2022-06-13 LAB — MICROALBUMIN / CREATININE URINE RATIO
Creatinine, Urine: 100.9 mg/dL
Microalb/Creat Ratio: 11 mg/g creat (ref 0–29)
Microalbumin, Urine: 11.2 ug/mL

## 2022-06-22 ENCOUNTER — Other Ambulatory Visit: Payer: Self-pay

## 2022-06-22 ENCOUNTER — Encounter: Payer: Self-pay | Admitting: Internal Medicine

## 2022-06-22 ENCOUNTER — Ambulatory Visit: Payer: Medicare HMO | Admitting: Internal Medicine

## 2022-06-22 VITALS — BP 104/63 | HR 87 | Temp 98.1°F | Ht 65.0 in | Wt 173.4 lb

## 2022-06-22 DIAGNOSIS — K219 Gastro-esophageal reflux disease without esophagitis: Secondary | ICD-10-CM

## 2022-06-22 DIAGNOSIS — K5900 Constipation, unspecified: Secondary | ICD-10-CM | POA: Diagnosis not present

## 2022-06-22 MED ORDER — HYDROXYZINE HCL 10 MG PO TABS: ORAL_TABLET | ORAL | 1 refills | Status: AC

## 2022-06-22 NOTE — Patient Instructions (Signed)
It was good to see you again today!  GERD and constipation information provided  As discussed, one of the side effects of Ozempic is slowing your entire GI tract down which means you we will have a tendency for nausea and more reflux as well as constipation.  Agree with decreasing but not stopping  Ozempic  Continue pantoprazole 40 mg daily best taken 30 minutes before a meal-lunch  Use MiraLAX daily take one half To 1 full capful powder in 8 ounces of water daily to promote good bowel function.  Focus on eating for 5 smaller meals than 3 traditional meals daily  Office visit with me in 3 months

## 2022-06-22 NOTE — Progress Notes (Unsigned)
Primary Care Physician:  Fayrene Helper, MD Primary Gastroenterologist:  Dr. Gala Romney  Pre-Procedure History & Physical: HPI:  Jennifer Blackburn is a 77 y.o. female here for GERD and constipation.  She tells me Ozempic is been giving her hard time with nausea worsening of GERD and constipation.  Her endocrinologist recently decreased her dosing to 1 mg weekly.  We discussed the side effects of Ozempic including a decrease in GI motility diffusely which means gastric emptying will be slower as well as colonic transit. She has not been taking Protonix before meals she has been taking it in the mid afternoon.  She takes MiraLAX at varying doses at bedtime achieving 1 bowel movement daily to every other day. He has lost 10 pounds since being seen here little over a year ago. She is up-to-date on colonoscopy/EGD. Past Medical History:  Diagnosis Date   Anxiety    Arthritis    CAD (coronary artery disease)    Depression    Diabetes mellitus type II    without complication   DJD (degenerative joint disease) of lumbar spine    Hypercholesteremia    Hyperlipidemia    Hypertension    benign    Hypothyroidism    Thyroid cancer 2001    Past Surgical History:  Procedure Laterality Date   ABDOMINAL HYSTERECTOMY     CARDIAC CATHETERIZATION     CATARACT EXTRACTION W/PHACO Right 07/17/2012   Procedure: CATARACT EXTRACTION PHACO AND INTRAOCULAR LENS PLACEMENT ([place]);  Surgeon: Tonny Branch, MD;  Location: [facility];  Service: Ophthalmology;  Laterality: Right;  CDE:25.51   CATARACT EXTRACTION W/PHACO Left 09/17/2016   Procedure: CATARACT EXTRACTION PHACO AND INTRAOCULAR LENS PLACEMENT LEFT EYE;  Surgeon: Tonny Branch, MD;  Location: [facility];  Service: Ophthalmology;  Laterality: Left;  CDE: 19.23   COLONOSCOPY     COLONOSCOPY N/A 01/15/2014   Procedure: COLONOSCOPY;  Surgeon: Daneil Dolin, MD;  Location: [facility];  Service: Endoscopy;  Laterality: N/A;  9:00 AM   COLONOSCOPY WITH PROPOFOL N/A  01/25/2019   Procedure: COLONOSCOPY WITH PROPOFOL;  Surgeon: Daneil Dolin, MD; diverticulosis in the entire colon, otherwise normal exam.  No repeat colonoscopy due to age.   DOPPLER ECHOCARDIOGRAPHY     ESOPHAGOGASTRODUODENOSCOPY (EGD) WITH PROPOFOL N/A 01/25/2019   Procedure: ESOPHAGOGASTRODUODENOSCOPY (EGD) WITH PROPOFOL;  Surgeon: Daneil Dolin, MD; normal esophagus without dilation due to inability to pass dilator beyond the hypopharynx, 1 small nonbleeding gastric ulcer s/p biopsied, otherwise normal exam.  Pathology with ulcer with reactive changes, no H. pylori, metaplasia, dysplasia, or malignancy.   ESOPHAGOGASTRODUODENOSCOPY (EGD) WITH PROPOFOL N/A 08/21/2019   Procedure: ESOPHAGOGASTRODUODENOSCOPY (EGD) WITH PROPOFOL;  Surgeon: Daneil Dolin, MD; Normal esophagus. Previously noted gastric ulcer completely healed. Normal examined duodenum.    JOINT REPLACEMENT  07/01/2010   left hip   KNEE SURGERY Right    arthroscopy   left hip replaced  07/01/2010   SPINE SURGERY  2006   cervical   stress dipyridamole myocardial perfusion     THYROIDECTOMY     TONSILLECTOMY     VESICOVAGINAL FISTULA CLOSURE W/ TAH      Prior to Admission medications   Medication Sig Start Date End Date Taking? Authorizing Provider  ACCU-CHEK GUIDE test strip USE AS INSTRUCTED TO CHECK BLOOD SUGAR 2 TIMES PER DAY DX CODE E11.65 06/03/22  Yes Fayrene Helper, MD  Cetirizine HCl 10 MG TBDP Take one tablet by mouth two times daily for allergy Patient taking  differently: Take 1 tablet by mouth daily. 11/06/19  Yes Fayrene Helper, MD  Cholecalciferol (VITAMIN D3) 50 MCG (2000 UT) TABS Take 2,000 Units by mouth daily.   Yes [provider]  gabapentin (NEURONTIN) 100 MG capsule Take one capsule twice daily and three at bedtime 12/16/21  Yes Fayrene Helper, MD  hydrOXYzine (ATARAX) 10 MG tablet Take one tablet twice daily and two tablets at bedtime as needed for generalized itching. 06/22/22  Yes  Fayrene Helper, MD  imipramine (TOFRANIL) 50 MG tablet TAKE 1 TABLET AT BEDTIME 11/10/21  Yes Fayrene Helper, MD  insulin aspart (NOVOLOG FLEXPEN) 100 UNIT/ML FlexPen Inject 10-15 Units into the skin 3 (three) times daily with meals. Patient taking differently: Inject 15 Units into the skin 3 (three) times daily with meals. 12/03/20 06/22/22 Yes Philemon Kingdom, MD  Insulin Pen Needle 32G X 4 MM MISC Use 4x a day 12/28/18  Yes Philemon Kingdom, MD  isosorbide mononitrate (IMDUR) 30 MG 24 hr tablet TAKE HALF A TABLET BY MOUTH EVERY DAY 04/09/20  Yes Lorretta Harp, MD  Lancets (ACCU-CHEK MULTICLIX) lancets Use as instructed three times daily dx 250.01 05/07/13  Yes Fayrene Helper, MD  levothyroxine (SYNTHROID) 125 MCG tablet TAKE 1 TABLET BY MOUTH EVERY DAY BEFORE BREAKFAST 12/21/21  Yes Philemon Kingdom, MD  metFORMIN (GLUCOPHAGE) 1000 MG tablet TAKE 1 TABLET TWICE DAILY 11/09/21  Yes Fayrene Helper, MD  metoprolol tartrate (LOPRESSOR) 50 MG tablet TAKE 1 TABLET BY MOUTH TWICE A DAY Patient taking differently: Take 25 mg by mouth in the morning and at bedtime. 09/03/21  Yes Lorretta Harp, MD  ondansetron (ZOFRAN-ODT) 8 MG disintegrating tablet 8mg  ODT q4 hours prn nausea 05/25/22  Yes Delo, Nathaneil Canary, MD  pantoprazole (PROTONIX) 40 MG tablet Take 1 tablet (40 mg total) by mouth daily. 05/18/22  Yes Daneil Dolin, MD  PARoxetine (PAXIL) 20 MG tablet TAKE 1 TABLET EVERY DAY 03/23/22  Yes Fayrene Helper, MD  pravastatin (PRAVACHOL) 80 MG tablet TAKE 1 TABLET EVERY DAY 04/12/22  Yes Fayrene Helper, MD  Semaglutide, 2 MG/DOSE, (OZEMPIC, 2 MG/DOSE,) 8 MG/3ML SOPN Inject into the skin.   Yes [provider]  TOUJEO SOLOSTAR 300 UNIT/ML Solostar Pen INJECT 30-50 UNITS TWO TIMES DAILY 01/05/22  Yes Philemon Kingdom, MD    Allergies as of 06/22/2022 - Review Complete 06/22/2022  Allergen Reaction Noted   Lipitor [atorvastatin calcium] Other (See Comments) 02/07/2019    Actos [pioglitazone] Other (See Comments) 02/03/2021   Crestor [rosuvastatin] Other (See Comments) 02/03/2021   Daypro [oxaprozin] Hives 08/23/2016   Nsaids Hives 04/14/2007   Sulfonamide derivatives Hives 04/14/2007    Family History  Problem Relation Age of Onset   Heart attack Father    Heart failure Mother    Asthma Daughter    Sleep apnea Son        CPAP   Colon cancer Neg Hx     Social History   Socioeconomic History   Marital status: Married    Spouse name: Not on file   Number of children: 2   Years of education: 14   Highest education level: Some college, no degree  Occupational History   Occupation: retired     Comment: bank  Tobacco Use   Smoking status: Never    Passive exposure: Yes   Smokeless tobacco: Never   Tobacco comments:    Husband smokes in the home  Vaping Use   Vaping Use: Never  used  Substance and Sexual Activity   Alcohol use: No   Drug use: No   Sexual activity: Not Currently  Other Topics Concern   Not on file  Social History Narrative   Lives with husband, at home   No caffeine   Social Determinants of Health   Financial Resource Strain: Low Risk  (06/10/2022)   Overall Financial Resource Strain (CARDIA)    Difficulty of Paying Living Expenses: Not hard at all  Food Insecurity: No Food Insecurity (06/10/2022)   Hunger Vital Sign    Worried About Running Out of Food in the Last Year: Never true    Ran Out of Food in the Last Year: Never true  Transportation Needs: No Transportation Needs (06/10/2022)   PRAPARE - Hydrologist (Medical): No    Lack of Transportation (Non-Medical): No  Physical Activity: Insufficiently Active (06/10/2022)   Exercise Vital Sign    Days of Exercise per Week: 2 days    Minutes of Exercise per Session: 30 min  Stress: No Stress Concern Present (06/10/2022)   [place]    Feeling of Stress : Not at all  Social  Connections: [place] (06/10/2022)   Social Connection and Isolation Panel [NHANES]    Frequency of Communication with Friends and Family: More than three times a week    Frequency of Social Gatherings with Friends and Family: Twice a week    Attends Religious Services: More than 4 times per year    Active Member of [employer] or Organizations: Yes    Attends Music therapist: More than 4 times per year    Marital Status: Married  Human resources officer Violence: Not At Risk (08/31/2021)   Humiliation, Afraid, Rape, and Kick questionnaire    Fear of Current or Ex-Partner: No    Emotionally Abused: No    Physically Abused: No    Sexually Abused: No    Review of Systems: See HPI, otherwise negative ROS  Physical Exam: BP 104/63 (BP Location: Right Arm, Patient Position: Sitting, Cuff Size: Normal)   Pulse 87   Temp 98.1 F (36.7 C) (Oral)   Ht 5\' 5"  (1.651 m)   Wt 173 lb 6.4 oz (78.7 kg)   LMP 11/11/2016   SpO2 100%   BMI 28.86 kg/m  General:   Alert,  Well-developed, well-nourished, pleasant and cooperative in NAD Heart:  Regular rate and rhythm; no murmurs, clicks, rubs,  or gallops. Abdomen: Non-distended, normal bowel sounds.  Soft and nontender without appreciable mass or hepatosplenomegaly.  No succussion splash.  Impression/Plan: 77 year old lady with GERD, constipation in the setting of diabetes on semaglutide preparation.  Discussed the mechanism of action of semaglutide and associated negative impact on GI motility.  She needs to optimize MiraLAX and PPI dosing while on Ozempic.  Agree with her continuing on Ozempic all but at a lower dose.  Recommendations:  Agree with decreasing but not stopping  Ozempic  Continue pantoprazole 40 mg daily best taken 30 minutes before a meal-lunch  Use MiraLAX daily take one half To 1 full capful powder in 8 ounces of water daily to promote good bowel function.  Focus on eating for 5 smaller meals than 3 traditional  meals daily  Office visit with me in 3 months        Notice: This dictation was prepared with Dragon dictation along with smaller phrase technology. Any transcriptional errors that result from this process  are unintentional and may not be corrected upon review.

## 2022-07-10 ENCOUNTER — Other Ambulatory Visit: Payer: Self-pay | Admitting: Internal Medicine

## 2022-07-10 DIAGNOSIS — E1159 Type 2 diabetes mellitus with other circulatory complications: Secondary | ICD-10-CM

## 2022-07-14 ENCOUNTER — Ambulatory Visit: Payer: Medicare HMO | Attending: Cardiovascular Disease | Admitting: Cardiovascular Disease

## 2022-07-14 ENCOUNTER — Encounter: Payer: Self-pay | Admitting: Cardiovascular Disease

## 2022-07-14 VITALS — BP 112/60 | HR 80 | Ht 65.0 in | Wt 177.0 lb

## 2022-07-14 DIAGNOSIS — E782 Mixed hyperlipidemia: Secondary | ICD-10-CM

## 2022-07-14 DIAGNOSIS — I10 Essential (primary) hypertension: Secondary | ICD-10-CM

## 2022-07-14 DIAGNOSIS — I2510 Atherosclerotic heart disease of native coronary artery without angina pectoris: Secondary | ICD-10-CM

## 2022-07-14 NOTE — Progress Notes (Signed)
07/14/2022 Jennifer Blackburn   March 21, 1946  [id number]  Primary Physician Kerri Perches, MD Primary Cardiologist: Runell Gess MD FACP, [place], [place], [place]  HPI:  Jennifer Blackburn is a 77 y.o.  moderately overweight, married [place] American female, mother of 2, grandmother to 4 grandchildren who I last saw in the office 06/04/2021. She has a history of minimal CAD by cath back in June 2005 with normal LV function. Her other problems include treated hypertension, diabetes and dyslipidemia.  She does have a strong family history of heart disease with a father that died of an MI at age 57. Most recent lipid profile performed by Dr. Lodema Hong on 11/14/14 revealing a total cholesterol 175, LDL of 112 and HDL of 42.. Dr. Lodema Hong continues to follow her with profile . She has had several episodes of nocturnal chest pressure which has awakened her from sleep with a "heavy sensation on her chest and a feeling of diaphoresis since I last saw her but these are infrequent and have not changed frequency or severity. She did see Corine Shelter in the office 02/17/17 complaining of chest pain and a Myoview stress test performed 02/23/17 was low risk.    Since I saw her in the office 1 year ago she continues to do well.  She did have her blood pressure medicines down titrated/discontinued because of hypotensive episode and she is remained normotensive since.  She denies chest pain or shortness of breath.  She was seen in the ER because of nausea vomiting diarrhea probably from a viral gastroenteritis required IV fluids.     Current Meds  Medication Sig   ACCU-CHEK GUIDE test strip USE AS INSTRUCTED TO CHECK BLOOD SUGAR 2 TIMES PER DAY DX CODE E11.65   Cetirizine HCl 10 MG TBDP Take one tablet by mouth two times daily for allergy (Patient taking differently: Take 1 tablet by mouth daily.)   Cholecalciferol (VITAMIN D3) 50 MCG (2000 UT) TABS Take 2,000 Units by mouth daily.   gabapentin (NEURONTIN) 100 MG  capsule Take one capsule twice daily and three at bedtime   hydrOXYzine (ATARAX) 10 MG tablet Take one tablet twice daily and two tablets at bedtime as needed for generalized itching.   imipramine (TOFRANIL) 50 MG tablet TAKE 1 TABLET AT BEDTIME   insulin glargine, 1 Unit Dial, (TOUJEO SOLOSTAR) 300 UNIT/ML Solostar Pen INJECT 30-50 UNITS TWO TIMES DAILY   Insulin Pen Needle 32G X 4 MM MISC Use 4x a day   isosorbide mononitrate (IMDUR) 30 MG 24 hr tablet TAKE HALF A TABLET BY MOUTH EVERY DAY   Lancets (ACCU-CHEK MULTICLIX) lancets Use as instructed three times daily dx 250.01   levothyroxine (SYNTHROID) 125 MCG tablet TAKE 1 TABLET BY MOUTH EVERY DAY BEFORE BREAKFAST   metFORMIN (GLUCOPHAGE) 1000 MG tablet TAKE 1 TABLET TWICE DAILY   metoprolol tartrate (LOPRESSOR) 50 MG tablet TAKE 1 TABLET BY MOUTH TWICE A DAY (Patient taking differently: Take 25 mg by mouth in the morning and at bedtime.)   ondansetron (ZOFRAN-ODT) 8 MG disintegrating tablet  ODT q4 hours prn nausea   pantoprazole (PROTONIX) 40 MG tablet Take 1 tablet (40 mg total) by mouth daily.   PARoxetine (PAXIL) 20 MG tablet TAKE 1 TABLET EVERY DAY   pravastatin (PRAVACHOL) 80 MG tablet TAKE 1 TABLET EVERY DAY   Semaglutide, 2 MG/DOSE, (OZEMPIC, 2 MG/DOSE,) 8 MG/3ML SOPN Inject into the skin.     Allergies  Allergen Reactions   Lipitor [Atorvastatin Calcium]  Other (See Comments)    Muscle aches   Actos [Pioglitazone] Other (See Comments)    Peripheral edema   Crestor [Rosuvastatin] Other (See Comments)    Muscle aches   Daypro [Oxaprozin] Hives   Nsaids Hives   Sulfonamide Derivatives Hives    Social History   Socioeconomic History   Marital status: Married    Spouse name: Not on file   Number of children: 2   Years of education: 14   Highest education level: Some college, no degree  Occupational History   Occupation: retired     Comment: bank  Tobacco Use   Smoking status: Never    Passive exposure: Yes    Smokeless tobacco: Never   Tobacco comments:    Husband smokes in the home  Vaping Use   Vaping Use: Never used  Substance and Sexual Activity   Alcohol use: No   Drug use: No   Sexual activity: Not Currently  Other Topics Concern   Not on file  Social History Narrative   Lives with husband, at home   No caffeine   Social Determinants of Health   Financial Resource Strain: Low Risk  (06/10/2022)   Overall Financial Resource Strain (CARDIA)    Difficulty of Paying Living Expenses: Not hard at all  Food Insecurity: No Food Insecurity (06/10/2022)   Hunger Vital Sign    Worried About Running Out of Food in the Last Year: Never true    Ran Out of Food in the Last Year: Never true  Transportation Needs: No Transportation Needs (06/10/2022)   PRAPARE - Administrator, Civil Service (Medical): No    Lack of Transportation (Non-Medical): No  Physical Activity: Insufficiently Active (06/10/2022)   Exercise Vital Sign    Days of Exercise per Week: 2 days    Minutes of Exercise per Session: 30 min  Stress: No Stress Concern Present (06/10/2022)   [employer] of Occupational Health - Occupational Stress Questionnaire    Feeling of Stress : Not at all  Social Connections: Socially Integrated (06/10/2022)   Social Connection and Isolation Panel [NHANES]    Frequency of Communication with Friends and Family: More than three times a week    Frequency of Social Gatherings with Friends and Family: Twice a week    Attends Religious Services: More than 4 times per year    Active Member of [employer] or Organizations: Yes    Attends Engineer, structural: More than 4 times per year    Marital Status: Married  Catering manager Violence: Not At Risk (08/31/2021)   Humiliation, Afraid, Rape, and Kick questionnaire    Fear of Current or Ex-Partner: No    Emotionally Abused: No    Physically Abused: No    Sexually Abused: No     Review of Systems: General: negative for chills,  fever, night sweats or weight changes.  Cardiovascular: negative for chest pain, dyspnea on exertion, edema, orthopnea, palpitations, paroxysmal nocturnal dyspnea or shortness of breath Dermatological: negative for rash Respiratory: negative for cough or wheezing Urologic: negative for hematuria Abdominal: negative for nausea, vomiting, diarrhea, bright red blood per rectum, melena, or hematemesis Neurologic: negative for visual changes, syncope, or dizziness All other systems reviewed and are otherwise negative except as noted above.    Blood pressure 112/60, pulse 80, height  (1.651 m), weight 177 lb (80.3 kg), last menstrual period 11/11/2016.  General appearance: alert and no distress Neck: no adenopathy, no carotid bruit, no JVD,  supple, symmetrical, trachea midline, and thyroid not enlarged, symmetric, no tenderness/mass/nodules Lungs: clear to auscultation bilaterally Heart: regular rate and rhythm, S1, S2 normal, no murmur, click, rub or gallop Extremities: extremities normal, atraumatic, no cyanosis or edema Pulses: 2+ and symmetric  EKG not performed today  ASSESSMENT AND PLAN:   Primary hypertension Hypertension a blood pressure measured today at 112/60.  She is only on metoprolol at this time and had multiple blood pressure medicines discontinued because of hypotension.  Coronary atherosclerosis Minimal CAD by cardiac cath performed June 2005.  She denies chest pain or shortness of breath.  Hyperlipidemia History of hyperlipidemia on pravastatin with lipid profile performed 03/04/2022 revealing total cholesterol 162, LDL 91 and HDL 46.     Runell Gess MD [facility], [facility] 07/14/2022 11:41 AM

## 2022-07-14 NOTE — Assessment & Plan Note (Signed)
Hypertension a blood pressure measured today at 112/60.  She is only on metoprolol at this time and had multiple blood pressure medicines discontinued because of hypotension.

## 2022-07-14 NOTE — Patient Instructions (Signed)
Medication Instructions:  Your physician recommends that you continue on your current medications as directed. Please refer to the Current Medication list given to you today.  *If you need a refill on your cardiac medications before your next appointment, please call your pharmacy*   Follow-Up: At [facility], you and your health needs are our priority.  As part of our continuing mission to provide you with exceptional heart care, we have created designated Provider Care Teams.  These Care Teams include your primary Cardiologist (physician) and Advanced Practice Providers (APPs -  Physician Assistants and Nurse Practitioners) who all work together to provide you with the care you need, when you need it.  We recommend signing up for the patient portal called "MyChart".  Sign up information is provided on this After Visit Summary.  MyChart is used to connect with patients for Virtual Visits (Telemedicine).  Patients are able to view lab/test results, encounter notes, upcoming appointments, etc.  Non-urgent messages can be sent to your provider as well.   To learn more about what you can do with MyChart, go to [email or web address].    Your next appointment:   12 month(s)  Provider:   Jonathan Berry, MD    

## 2022-07-14 NOTE — Assessment & Plan Note (Signed)
Minimal CAD by cardiac cath performed June 2005.  She denies chest pain or shortness of breath.

## 2022-07-14 NOTE — Assessment & Plan Note (Signed)
History of hyperlipidemia on pravastatin with lipid profile performed 03/04/2022 revealing total cholesterol 162, LDL 91 and HDL 46.

## 2022-08-09 ENCOUNTER — Other Ambulatory Visit: Payer: Self-pay | Admitting: Internal Medicine

## 2022-08-09 DIAGNOSIS — C73 Malignant neoplasm of thyroid gland: Secondary | ICD-10-CM

## 2022-09-09 ENCOUNTER — Telehealth: Payer: Self-pay | Admitting: Cardiovascular Disease

## 2022-09-09 DIAGNOSIS — M791 Myalgia, unspecified site: Secondary | ICD-10-CM | POA: Diagnosis not present

## 2022-09-09 DIAGNOSIS — E559 Vitamin D deficiency, unspecified: Secondary | ICD-10-CM | POA: Diagnosis not present

## 2022-09-09 DIAGNOSIS — E782 Mixed hyperlipidemia: Secondary | ICD-10-CM | POA: Diagnosis not present

## 2022-09-09 DIAGNOSIS — I10 Essential (primary) hypertension: Secondary | ICD-10-CM | POA: Diagnosis not present

## 2022-09-09 NOTE — Telephone Encounter (Signed)
*  STAT* If patient is at the pharmacy, call can be transferred to refill team.   1. Which medications need to be refilled? (please list name of each medication and dose if known) isosorbide mononitrate (IMDUR) 30 MG 24 hr tablet  2. Which pharmacy/location (including street and city if local pharmacy) is medication to be sent to? [id number] - [place], [place] - [address] [facility] [address] [facility] [facility]  3. Do they need a 30 day or 90 day supply?  30 day supply  Patient states she hasn't had a new prescription in a while because her husband was taking the exact same dose and they took him off the medication, but he had 3 bottles at home, so she has been taking his.

## 2022-09-10 LAB — CMP14+EGFR
ALT: 21 IU/L (ref 0–32)
AST: 29 IU/L (ref 0–40)
Albumin: 4.4 g/dL (ref 3.8–4.8)
Alkaline Phosphatase: 60 IU/L (ref 44–121)
BUN/Creatinine Ratio: 15 (ref 12–28)
BUN: 11 mg/dL (ref 8–27)
Bilirubin Total: 0.3 mg/dL (ref 0.0–1.2)
CO2: 25 mmol/L (ref 20–29)
Calcium: 8.9 mg/dL (ref 8.7–10.3)
Chloride: 95 mmol/L — ABNORMAL LOW (ref 96–106)
Creatinine, Ser: 0.72 mg/dL (ref 0.57–1.00)
Globulin, Total: 3.3 g/dL (ref 1.5–4.5)
Glucose: 251 mg/dL — ABNORMAL HIGH (ref 70–99)
Potassium: 4.5 mmol/L (ref 3.5–5.2)
Sodium: 138 mmol/L (ref 134–144)
Total Protein: 7.7 g/dL (ref 6.0–8.5)
eGFR: 86 mL/min/{1.73_m2} (ref >59)

## 2022-09-10 LAB — LIPID PANEL
Chol/HDL Ratio: 4.5 ratio — ABNORMAL HIGH (ref 0.0–4.4)
Cholesterol, Total: 202 mg/dL — ABNORMAL HIGH (ref 100–199)
HDL: 45 mg/dL (ref >39)
LDL Chol Calc (NIH): 124 mg/dL — ABNORMAL HIGH (ref 0–99)
Triglycerides: 187 mg/dL — ABNORMAL HIGH (ref 0–149)
VLDL Cholesterol Cal: 33 mg/dL (ref 5–40)

## 2022-09-10 LAB — VITAMIN D 25 HYDROXY (VIT D DEFICIENCY, FRACTURES): Vit D, 25-Hydroxy: 34 ng/mL (ref 30.0–100.0)

## 2022-09-10 LAB — MAGNESIUM: Magnesium: 1.7 mg/dL (ref 1.6–2.3)

## 2022-09-10 MED ORDER — ISOSORBIDE MONONITRATE ER 30 MG PO TB24: ORAL_TABLET | ORAL | 3 refills | Status: DC

## 2022-09-13 ENCOUNTER — Encounter: Payer: Self-pay | Admitting: Internal Medicine

## 2022-09-13 ENCOUNTER — Ambulatory Visit (INDEPENDENT_AMBULATORY_CARE_PROVIDER_SITE_OTHER): Payer: Medicare HMO | Admitting: Internal Medicine

## 2022-09-13 VITALS — Ht 65.0 in | Wt 177.0 lb

## 2022-09-13 DIAGNOSIS — Z0000 Encounter for general adult medical examination without abnormal findings: Secondary | ICD-10-CM | POA: Diagnosis not present

## 2022-09-13 NOTE — Progress Notes (Signed)
Subjective:   Jennifer Blackburn is a 77 y.o. female who presents for Medicare Annual (Subsequent) preventive examination.  Visit Complete: Virtual  I connected with  Jennifer Blackburn on 09/13/22 by a audio enabled telemedicine application and verified that I am speaking with the correct person using two identifiers.  Patient Location: Home  Provider Location: Office/Clinic  I discussed the limitations of evaluation and management by telemedicine. The patient expressed understanding and agreed to proceed.  Patient Medicare AWV questionnaire was not completed by the patient.  Review of Systems    Review of Systems  All other systems reviewed and are negative.   Objective:    Today's Vitals   09/13/22 1434  Weight: 177 lb (80.3 kg)  Height: 5\' 5"  (1.651 m)   Body mass index is 29.45 kg/m.     05/28/2022    4:11 PM 05/25/2022    2:13 AM 08/31/2021    2:36 PM 08/06/2020    1:58 PM 02/06/2020    2:59 PM 08/21/2019    8:28 AM 08/15/2019    2:00 PM  Advanced Directives  Does Patient Have a Medical Advance Directive? No No No No No No No  Would patient like information on creating a medical advance directive?   No - Patient declined No - Patient declined Yes (MAU/Ambulatory/Procedural Areas - Information given) No - Patient declined No - Patient declined    Current Medications (verified) Outpatient Encounter Medications as of 09/13/2022  Medication Sig   ACCU-CHEK GUIDE test strip USE AS INSTRUCTED TO CHECK BLOOD SUGAR 2 TIMES PER DAY DX CODE E11.65   Cetirizine HCl 10 MG TBDP Take one tablet by mouth two times daily for allergy (Patient taking differently: Take 1 tablet by mouth daily.)   Cholecalciferol (VITAMIN D3) 50 MCG (2000 UT) TABS Take 2,000 Units by mouth daily.   gabapentin (NEURONTIN) 100 MG capsule Take one capsule twice daily and three at bedtime   hydrOXYzine (ATARAX) 10 MG tablet Take one tablet twice daily and two tablets at bedtime as needed for generalized  itching.   imipramine (TOFRANIL) 50 MG tablet TAKE 1 TABLET AT BEDTIME   insulin aspart (NOVOLOG FLEXPEN) 100 UNIT/ML FlexPen Inject 10-15 Units into the skin 3 (three) times daily with meals. (Patient taking differently: Inject 15 Units into the skin 3 (three) times daily with meals.)   insulin glargine, 1 Unit Dial, (TOUJEO SOLOSTAR) 300 UNIT/ML Solostar Pen INJECT 30-50 UNITS TWO TIMES DAILY   Insulin Pen Needle 32G X 4 MM MISC Use 4x a day   isosorbide mononitrate (IMDUR) 30 MG 24 hr tablet TAKE HALF A TABLET BY MOUTH DAILY   Lancets (ACCU-CHEK MULTICLIX) lancets Use as instructed three times daily dx 250.01   levothyroxine (SYNTHROID) 125 MCG tablet TAKE 1 TABLET BY MOUTH EVERY DAY BEFORE BREAKFAST   metFORMIN (GLUCOPHAGE) 1000 MG tablet TAKE 1 TABLET TWICE DAILY   metoprolol tartrate (LOPRESSOR) 50 MG tablet TAKE 1 TABLET BY MOUTH TWICE A DAY (Patient taking differently: Take 25 mg by mouth in the morning and at bedtime.)   ondansetron (ZOFRAN-ODT) 8 MG disintegrating tablet 8mg  ODT q4 hours prn nausea   pantoprazole (PROTONIX) 40 MG tablet Take 1 tablet (40 mg total) by mouth daily.   PARoxetine (PAXIL) 20 MG tablet TAKE 1 TABLET EVERY DAY   pravastatin (PRAVACHOL) 80 MG tablet TAKE 1 TABLET EVERY DAY   Semaglutide, 2 MG/DOSE, (OZEMPIC, 2 MG/DOSE,) 8 MG/3ML SOPN Inject into the skin.   No facility-administered encounter  medications on file as of 09/13/2022.    Allergies (verified) Lipitor [atorvastatin calcium], Actos [pioglitazone], Crestor [rosuvastatin], Daypro [oxaprozin], Nsaids, and Sulfonamide derivatives   History: Past Medical History:  Diagnosis Date   Anxiety    Arthritis    CAD (coronary artery disease)    Depression    Diabetes mellitus type II    without complication   DJD (degenerative joint disease) of lumbar spine    Hypercholesteremia    Hyperlipidemia    Hypertension    benign    Hypothyroidism    Thyroid cancer (HCC) 2001   Past Surgical History:   Procedure Laterality Date   ABDOMINAL HYSTERECTOMY     CARDIAC CATHETERIZATION     CATARACT EXTRACTION W/PHACO Right 07/17/2012   Procedure: CATARACT EXTRACTION PHACO AND INTRAOCULAR LENS PLACEMENT (IOC);  Surgeon: Gemma Payor, MD;  Location: [facility];  Service: Ophthalmology;  Laterality: Right;  CDE:25.51   CATARACT EXTRACTION W/PHACO Left 09/17/2016   Procedure: CATARACT EXTRACTION PHACO AND INTRAOCULAR LENS PLACEMENT LEFT EYE;  Surgeon: Gemma Payor, MD;  Location: [facility];  Service: Ophthalmology;  Laterality: Left;  CDE: 19.23   COLONOSCOPY     COLONOSCOPY N/A 01/15/2014   Procedure: COLONOSCOPY;  Surgeon: Corbin Ade, MD;  Location: [facility];  Service: Endoscopy;  Laterality: N/A;  9:00 AM   COLONOSCOPY WITH PROPOFOL N/A 01/25/2019   Procedure: COLONOSCOPY WITH PROPOFOL;  Surgeon: Corbin Ade, MD; diverticulosis in the entire colon, otherwise normal exam.  No repeat colonoscopy due to age.   DOPPLER ECHOCARDIOGRAPHY     ESOPHAGOGASTRODUODENOSCOPY (EGD) WITH PROPOFOL N/A 01/25/2019   Procedure: ESOPHAGOGASTRODUODENOSCOPY (EGD) WITH PROPOFOL;  Surgeon: Corbin Ade, MD; normal esophagus without dilation due to inability to pass dilator beyond the hypopharynx, 1 small nonbleeding gastric ulcer s/p biopsied, otherwise normal exam.  Pathology with ulcer with reactive changes, no H. pylori, metaplasia, dysplasia, or malignancy.   ESOPHAGOGASTRODUODENOSCOPY (EGD) WITH PROPOFOL N/A 08/21/2019   Procedure: ESOPHAGOGASTRODUODENOSCOPY (EGD) WITH PROPOFOL;  Surgeon: Corbin Ade, MD; Normal esophagus. Previously noted gastric ulcer completely healed. Normal examined duodenum.    JOINT REPLACEMENT  07/01/2010   left hip   KNEE SURGERY Right    arthroscopy   left hip replaced  07/01/2010   SPINE SURGERY  2006   cervical   stress dipyridamole myocardial perfusion     THYROIDECTOMY     TONSILLECTOMY     VESICOVAGINAL FISTULA CLOSURE W/ TAH     Family History  Problem Relation Age of Onset    Heart attack Father    Heart failure Mother    Asthma Daughter    Sleep apnea Son        CPAP   Colon cancer Neg Hx    Social History   Socioeconomic History   Marital status: Married    Spouse name: Not on file   Number of children: 2   Years of education: 14   Highest education level: Some college, no degree  Occupational History   Occupation: retired     Comment: bank  Tobacco Use   Smoking status: Never    Passive exposure: Yes   Smokeless tobacco: Never   Tobacco comments:    Husband smokes in the home  Vaping Use   Vaping Use: Never used  Substance and Sexual Activity   Alcohol use: No   Drug use: No   Sexual activity: Not Currently  Other Topics Concern   Not on file  Social History Narrative   Lives  with husband, at home   No caffeine   Social Determinants of Health   Financial Resource Strain: Low Risk  (06/10/2022)   Overall Financial Resource Strain (CARDIA)    Difficulty of Paying Living Expenses: Not hard at all  Food Insecurity: No Food Insecurity (06/10/2022)   Hunger Vital Sign    Worried About Running Out of Food in the Last Year: Never true    Ran Out of Food in the Last Year: Never true  Transportation Needs: No Transportation Needs (06/10/2022)   PRAPARE - Administrator, Civil Service (Medical): No    Lack of Transportation (Non-Medical): No  Physical Activity: Insufficiently Active (09/13/2022)   Exercise Vital Sign    Days of Exercise per Week: 3 days    Minutes of Exercise per Session: 20 min  Stress: No Stress Concern Present (06/10/2022)   [employer] of Occupational Health - Occupational Stress Questionnaire    Feeling of Stress : Not at all  Social Connections: Socially Integrated (06/10/2022)   Social Connection and Isolation Panel [NHANES]    Frequency of Communication with Friends and Family: More than three times a week    Frequency of Social Gatherings with Friends and Family: Twice a week    Attends Religious  Services: More than 4 times per year    Active Member of [employer] or Organizations: Yes    Attends Engineer, structural: More than 4 times per year    Marital Status: Married    Tobacco Counseling Counseling given: Not Answered Tobacco comments: Husband smokes in the home   Clinical Intake:  Pre-visit preparation completed: Yes  Pain : No/denies pain     Nutritional Status: BMI 25 -29 Overweight Diabetes: Yes CBG done?: Yes CBG resulted in Enter/ Edit results?: No (186) Did pt. bring in CBG monitor from home?: No  How often do you need to have someone help you when you read instructions, pamphlets, or other written materials from your doctor or pharmacy?: 1 - Never What is the last grade level you completed in school?: Some college         Activities of Daily Living    09/13/2022    2:36 PM  In your present state of health, do you have any difficulty performing the following activities:  Hearing? 0  Vision? 0  Difficulty concentrating or making decisions? 0  Walking or climbing stairs? 0  Dressing or bathing? 0  Doing errands, shopping? 0    Patient Care Team: Kerri Perches, MD as PCP - General Allyson Sabal Delton See, MD as PCP - Cardiology (Cardiology) Stephannie Li, MD (Ophthalmology) Runell Gess, MD as Consulting Physician (Cardiology) Alan Mulder, MD as Attending Physician (Endocrinology) Jena Gauss Gerrit Friends, MD as Consulting Physician (Gastroenterology) Roma Kayser, MD as Consulting Physician (Endocrinology)  Indicate any recent Medical Services you may have received from other than [facility] providers in the past year (date may be approximate).     Assessment:   This is a routine wellness examination for Jennifer Blackburn.  Hearing/Vision screen No results found.  Dietary issues and exercise activities discussed:     Goals Addressed   None    Depression Screen    09/13/2022    2:39 PM 06/11/2022   10:32 AM 05/28/2022    2:15 PM  03/05/2022    1:59 PM 12/16/2021    8:58 AM 12/01/2021    3:59 PM 11/19/2021   11:08 AM  PHQ 2/9 Scores  PHQ - 2  Score 0 0 0 0 0 0 0  PHQ- 9 Score  1 2        Fall Risk    09/13/2022    2:37 PM 06/11/2022   10:32 AM 05/28/2022    2:15 PM 03/05/2022    1:59 PM 12/16/2021    8:58 AM  Fall Risk   Falls in the past year? 1 1 0 1 1  Number falls in past yr: 0 0  0 0  Injury with Fall? 1 1  1 1   Comment rotator cuff tear      Risk for fall due to :  History of fall(s)  History of fall(s) History of fall(s)  Follow up  Falls evaluation completed  Falls evaluation completed Falls evaluation completed    MEDICARE RISK AT HOME:  Medicare Risk at Home - 09/13/22 1439     Any stairs in or around the home? Yes    If so, are there any without handrails? Yes    Home free of loose throw rugs in walkways, pet beds, electrical cords, etc? Yes    Adequate lighting in your home to reduce risk of falls? Yes    Life alert? No    Use of a cane, walker or w/c? No    Grab bars in the bathroom? Yes    Shower chair or bench in shower? Yes    Elevated toilet seat or a handicapped toilet? Yes             TIMED UP AND GO:  Was the test performed?  No    Cognitive Function:    08/31/2021    2:38 PM  MMSE - Mini Mental State Exam  Not completed: Unable to complete        09/13/2022    2:43 PM 08/31/2021    2:38 PM 08/06/2019    2:42 PM 08/02/2018    9:49 AM 01/03/2017    2:32 PM  6CIT Screen  What Year? 0 points 0 points 0 points 0 points 0 points  What month? 0 points 0 points 0 points 0 points 0 points  What time? 0 points 0 points 0 points 0 points 0 points  Count back from 20 0 points 0 points 0 points 0 points 0 points  Months in reverse 0 points 0 points 0 points 0 points 0 points  Repeat phrase 0 points 0 points 0 points 0 points 0 points  Total Score 0 points 0 points 0 points 0 points 0 points    Immunizations Immunization History  Administered Date(s) Administered   Fluad  Quad(high Dose 65+) 12/18/2018, 12/25/2019, 01/21/2021, 12/16/2021   Influenza Split 03/10/2012   Influenza Whole 01/04/2007   Influenza,inj,Quad PF,6+ Mos 11/28/2012, 12/31/2013, 01/31/2017, 01/10/2018   Influenza,inj,quad, With Preservative 12/20/2016   Influenza-Unspecified 02/09/2016   Moderna SARS-COV2 Booster Vaccination 01/24/2020   Moderna Sars-Covid-2 Vaccination 04/29/2019, 05/30/2019, 01/03/2021   Pneumococcal Conjugate-13 07/15/2014   Pneumococcal Polysaccharide-23 02/17/2004, 09/07/2012   Tdap 12/21/2010    TDAP status: Due, Education has been provided regarding the importance of this vaccine. Advised may receive this vaccine at local pharmacy or Health Dept. Aware to provide a copy of the vaccination record if obtained from local pharmacy or Health Dept. Verbalized acceptance and understanding.  Flu Vaccine status: Up to date  Pneumococcal vaccine status: Up to date  Covid-19 vaccine status: Information provided on how to obtain vaccines.   Qualifies for Shingles Vaccine? Yes   Zostavax completed No   Shingrix  Completed?: No.    Education has been provided regarding the importance of this vaccine. Patient has been advised to call insurance company to determine out of pocket expense if they have not yet received this vaccine. Advised may also receive vaccine at local pharmacy or Health Dept. Verbalized acceptance and understanding.  Screening Tests Health Maintenance  Topic Date Due   Zoster Vaccines- Shingrix (1 of 2) Never done   DTaP/Tdap/Td (2 - Td or Tdap) 12/20/2020   COVID-19 Vaccine (4 - 2023-24 season) 11/20/2021   INFLUENZA VACCINE  10/21/2022   HEMOGLOBIN A1C  12/09/2022   FOOT EXAM  01/01/2023   OPHTHALMOLOGY EXAM  04/09/2023   Diabetic kidney evaluation - Urine ACR  06/11/2023   Diabetic kidney evaluation - eGFR measurement  09/09/2023   Medicare Annual Wellness (AWV)  09/13/2023   Pneumonia Vaccine 72+ Years old  Completed   DEXA SCAN  Completed    Hepatitis C Screening  Completed   HPV VACCINES  Aged Out   Colonoscopy  Discontinued    Health Maintenance  Health Maintenance Due  Topic Date Due   Zoster Vaccines- Shingrix (1 of 2) Never done   DTaP/Tdap/Td (2 - Td or Tdap) 12/20/2020   COVID-19 Vaccine (4 - 2023-24 season) 11/20/2021    Colorectal cancer screening: No longer required.   Mammogram status: Completed 02/15/2022. Repeat every year  Bone Density status: Completed 08/14/2019. Results reflect: Bone density results: NORMAL.  Lung Cancer Screening: (Low Dose CT Chest recommended if Age 45-80 years, 20 pack-year currently smoking OR have quit w/in 15years.) does not qualify.   Additional Screening:  Hepatitis C Screening: does not qualify; Completed 07/17/2016  Vision Screening: Recommended annual ophthalmology exams for early detection of glaucoma and other disorders of the eye. Is the patient up to date with their annual eye exam?  Yes  Who is the provider or what is the name of the office in which the patient attends annual eye exams? [facility].  If pt is not established with a provider, would they like to be referred to a provider to establish care? No .   Dental Screening: Recommended annual dental exams for proper oral hygiene  Diabetic Foot Exam: Diabetic Foot Exam: Completed 12/31/2021  Community Resource Referral / Chronic Care Management: CRR required this visit?  No   CCM required this visit?  No     Plan:     I have personally reviewed and noted the following in the patient's chart:   Medical and social history Use of alcohol, tobacco or illicit drugs  Current medications and supplements including opioid prescriptions. Patient is not currently taking opioid prescriptions. Functional ability and status Nutritional status Physical activity Advanced directives List of other physicians Hospitalizations, surgeries, and ER visits in previous 12 months Vitals Screenings to include cognitive,  depression, and falls Referrals and appointments  In addition, I have reviewed and discussed with patient certain preventive protocols, quality metrics, and best practice recommendations. A written personalized care plan for preventive services as well as general preventive health recommendations were provided to patient.     Milus Banister, MD   09/13/2022

## 2022-09-13 NOTE — Patient Instructions (Addendum)
  Jennifer Blackburn , Thank you for taking time to come for your Medicare Wellness Visit. I appreciate your ongoing commitment to your health goals. Please review the following plan we discussed and let me know if I can assist you in the future.   These are the goals we discussed:   You need Shingles, Covid, and Tetanus vaccine. Please ask your husband not to smoke in your home. This impacts his health and your health.    Goals      Patient Stated     Keep A1C down. Prevent falls         This is a list of the screening recommended for you and due dates:  Health Maintenance  Topic Date Due   Zoster (Shingles) Vaccine (1 of 2) Never done   DTaP/Tdap/Td vaccine (2 - Td or Tdap) 12/20/2020   COVID-19 Vaccine (4 - 2023-24 season) 11/20/2021   Flu Shot  10/21/2022   Hemoglobin A1C  12/09/2022   Complete foot exam   01/01/2023   Eye exam for diabetics  04/09/2023   Yearly kidney health urinalysis for diabetes  06/11/2023   Yearly kidney function blood test for diabetes  09/09/2023   Medicare Annual Wellness Visit  09/13/2023   Pneumonia Vaccine  Completed   DEXA scan (bone density measurement)  Completed   Hepatitis C Screening  Completed   HPV Vaccine  Aged Out   Colon Cancer Screening  Discontinued

## 2022-09-14 ENCOUNTER — Telehealth: Payer: Self-pay | Admitting: Internal Medicine

## 2022-09-14 DIAGNOSIS — E1159 Type 2 diabetes mellitus with other circulatory complications: Secondary | ICD-10-CM

## 2022-09-14 MED ORDER — SEMAGLUTIDE (1 MG/DOSE) 4 MG/3ML ~~LOC~~ SOPN: 1.0000 mg | PEN_INJECTOR | SUBCUTANEOUS | 1 refills | Status: DC

## 2022-09-14 NOTE — Telephone Encounter (Signed)
Is she back on the Ozempic?  At last visit we kept her off that she had some GI symptoms.  If these are resolved, maybe she can start back.  She was using the 2 mg pen before.  She can start at 1 mg for the first week and then increase to 2 mg if tolerated.  1 mg is 36 clicks on the 2 mg pen.  If she is already taking this, she may need to increase the dose of NovoLog.  At last visit I advised her to take 10 to 15 units before meals.  If she is taking this consistently, she may need to increase the dose by 5 units or so with every meal.

## 2022-09-14 NOTE — Telephone Encounter (Signed)
Jennifer Blackburn states her GI symptoms have resided and she agrees to start back on the Ozempic 1 mg , this has been sent to [facility]

## 2022-09-14 NOTE — Telephone Encounter (Signed)
Patient is calling to say that her blood sugar level has been running high since last week.  This morning at 10:00 AM it was 236.  Yesterday at 6:30 PM it was 260.  Patient states that she is cutting back on a lot of things,but it is still running high and wants to know what she can do.

## 2022-09-16 ENCOUNTER — Ambulatory Visit (INDEPENDENT_AMBULATORY_CARE_PROVIDER_SITE_OTHER): Payer: Medicare HMO | Admitting: Family Medicine

## 2022-09-16 ENCOUNTER — Ambulatory Visit: Payer: Medicare HMO | Admitting: Family Medicine

## 2022-09-16 ENCOUNTER — Encounter: Payer: Self-pay | Admitting: Family Medicine

## 2022-09-16 VITALS — BP 130/67 | HR 85 | Temp 98.6°F | Ht 65.0 in | Wt 178.0 lb

## 2022-09-16 DIAGNOSIS — R350 Frequency of micturition: Secondary | ICD-10-CM | POA: Diagnosis not present

## 2022-09-16 DIAGNOSIS — R399 Unspecified symptoms and signs involving the genitourinary system: Secondary | ICD-10-CM | POA: Diagnosis not present

## 2022-09-16 DIAGNOSIS — N390 Urinary tract infection, site not specified: Secondary | ICD-10-CM

## 2022-09-16 MED ORDER — CEPHALEXIN 500 MG PO CAPS: 500.0000 mg | ORAL_CAPSULE | Freq: Two times a day (BID) | ORAL | 0 refills | Status: AC

## 2022-09-16 NOTE — Progress Notes (Signed)
Patient Office Visit   Subjective   Patient ID: Jennifer Blackburn, female    DOB: November 09, 1945  Age: 77 y.o. MRN: [id number]  CC:  Chief Complaint  Patient presents with   Acute Visit   Urinary Tract Infection    HPI Jennifer Blackburn 77 year old female, presents to the clinic for urinary symptoms started one month ago She  has a past medical history of Anxiety, Arthritis, CAD (coronary artery disease), Depression, Diabetes mellitus type II, DJD (degenerative joint disease) of lumbar spine, Hypercholesteremia, Hyperlipidemia, Hypertension, Hypothyroidism, and Thyroid cancer (HCC) (2001).  Urinary Tract Infection  This is a new problem. The current episode started  4 weeks ago. The problem occurs intermittently. The problem has been gradually worsening. The quality of the pain is described as aching and burning. The pain is at a severity of 7/10. There has been no fever. She is not sexually active. There is No history of pyelonephritis. Associated symptoms include a discharge, flank pain, frequency, hesitancy and urgency. Treatments tried: monistat and cranberry juice. The treatment provided mild relief. There is history of kidney stones        Outpatient Encounter Medications as of 09/16/2022  Medication Sig   ACCU-CHEK GUIDE test strip USE AS INSTRUCTED TO CHECK BLOOD SUGAR 2 TIMES PER DAY DX CODE E11.65   cephALEXin (KEFLEX) 500 MG capsule Take 1 capsule (500 mg total) by mouth 2 (two) times daily for 7 days.   Cetirizine HCl 10 MG TBDP Take one tablet by mouth two times daily for allergy (Patient taking differently: Take 1 tablet by mouth daily.)   Cholecalciferol (VITAMIN D3) 50 MCG (2000 UT) TABS Take 2,000 Units by mouth daily.   gabapentin (NEURONTIN) 100 MG capsule Take one capsule twice daily and three at bedtime   hydrOXYzine (ATARAX) 10 MG tablet Take one tablet twice daily and two tablets at bedtime as needed for generalized itching.   imipramine (TOFRANIL) 50 MG tablet TAKE  1 TABLET AT BEDTIME   insulin glargine, 1 Unit Dial, (TOUJEO SOLOSTAR) 300 UNIT/ML Solostar Pen INJECT 30-50 UNITS TWO TIMES DAILY   Insulin Pen Needle 32G X 4 MM MISC Use 4x a day   isosorbide mononitrate (IMDUR) 30 MG 24 hr tablet TAKE HALF A TABLET BY MOUTH DAILY   Lancets (ACCU-CHEK MULTICLIX) lancets Use as instructed three times daily dx 250.01   levothyroxine (SYNTHROID) 125 MCG tablet TAKE 1 TABLET BY MOUTH EVERY DAY BEFORE BREAKFAST   metFORMIN (GLUCOPHAGE) 1000 MG tablet TAKE 1 TABLET TWICE DAILY   metoprolol tartrate (LOPRESSOR) 50 MG tablet TAKE 1 TABLET BY MOUTH TWICE A DAY (Patient taking differently: Take 25 mg by mouth in the morning and at bedtime.)   ondansetron (ZOFRAN-ODT) 8 MG disintegrating tablet 8mg  ODT q4 hours prn nausea   pantoprazole (PROTONIX) 40 MG tablet Take 1 tablet (40 mg total) by mouth daily.   PARoxetine (PAXIL) 20 MG tablet TAKE 1 TABLET EVERY DAY   pravastatin (PRAVACHOL) 80 MG tablet TAKE 1 TABLET EVERY DAY   Semaglutide, 1 MG/DOSE, 4 MG/3ML SOPN Inject 1 mg as directed once a week.   insulin aspart (NOVOLOG FLEXPEN) 100 UNIT/ML FlexPen Inject 10-15 Units into the skin 3 (three) times daily with meals. (Patient taking differently: Inject 15 Units into the skin 3 (three) times daily with meals.)   No facility-administered encounter medications on file as of 09/16/2022.    Past Surgical History:  Procedure Laterality Date   ABDOMINAL HYSTERECTOMY  CARDIAC CATHETERIZATION     CATARACT EXTRACTION W/PHACO Right 07/17/2012   Procedure: CATARACT EXTRACTION PHACO AND INTRAOCULAR LENS PLACEMENT (IOC);  Surgeon: Gemma Payor, MD;  Location: [facility];  Service: Ophthalmology;  Laterality: Right;  CDE:25.51   CATARACT EXTRACTION W/PHACO Left 09/17/2016   Procedure: CATARACT EXTRACTION PHACO AND INTRAOCULAR LENS PLACEMENT LEFT EYE;  Surgeon: Gemma Payor, MD;  Location: [facility];  Service: Ophthalmology;  Laterality: Left;  CDE: 19.23   COLONOSCOPY     COLONOSCOPY N/A  01/15/2014   Procedure: COLONOSCOPY;  Surgeon: Corbin Ade, MD;  Location: [facility];  Service: Endoscopy;  Laterality: N/A;  9:00 AM   COLONOSCOPY WITH PROPOFOL N/A 01/25/2019   Procedure: COLONOSCOPY WITH PROPOFOL;  Surgeon: Corbin Ade, MD; diverticulosis in the entire colon, otherwise normal exam.  No repeat colonoscopy due to age.   DOPPLER ECHOCARDIOGRAPHY     ESOPHAGOGASTRODUODENOSCOPY (EGD) WITH PROPOFOL N/A 01/25/2019   Procedure: ESOPHAGOGASTRODUODENOSCOPY (EGD) WITH PROPOFOL;  Surgeon: Corbin Ade, MD; normal esophagus without dilation due to inability to pass dilator beyond the hypopharynx, 1 small nonbleeding gastric ulcer s/p biopsied, otherwise normal exam.  Pathology with ulcer with reactive changes, no H. pylori, metaplasia, dysplasia, or malignancy.   ESOPHAGOGASTRODUODENOSCOPY (EGD) WITH PROPOFOL N/A 08/21/2019   Procedure: ESOPHAGOGASTRODUODENOSCOPY (EGD) WITH PROPOFOL;  Surgeon: Corbin Ade, MD; Normal esophagus. Previously noted gastric ulcer completely healed. Normal examined duodenum.    JOINT REPLACEMENT  07/01/2010   left hip   KNEE SURGERY Right    arthroscopy   left hip replaced  07/01/2010   SPINE SURGERY  2006   cervical   stress dipyridamole myocardial perfusion     THYROIDECTOMY     TONSILLECTOMY     VESICOVAGINAL FISTULA CLOSURE W/ TAH      Review of Systems  Constitutional:  Negative for chills and fever.  Respiratory:  Negative for shortness of breath.   Cardiovascular:  Negative for chest pain.  Gastrointestinal:  Negative for abdominal pain.  Genitourinary:  Positive for dysuria, flank pain, frequency and urgency. Negative for hematuria.  Neurological:  Negative for dizziness.      Objective    BP 130/67   Pulse 85   Temp 98.6 F (37 C) (Oral)   Ht 5\' 5"  (1.651 m)   Wt 178 lb (80.7 kg)   LMP 11/11/2016   SpO2 96%   BMI 29.62 kg/m   Physical Exam Vitals reviewed.  Constitutional:      General: She is not in acute  distress.    Appearance: Normal appearance. She is not ill-appearing, toxic-appearing or diaphoretic.  HENT:     Head: Normocephalic.  Eyes:     General:        Right eye: No discharge.        Left eye: No discharge.     Conjunctiva/sclera: Conjunctivae normal.  Cardiovascular:     Rate and Rhythm: Normal rate.     Pulses: Normal pulses.     Heart sounds: Normal heart sounds.  Pulmonary:     Effort: Pulmonary effort is normal. No respiratory distress.     Breath sounds: Normal breath sounds.  Abdominal:     General: Bowel sounds are normal.     Palpations: Abdomen is soft.     Tenderness: There is no abdominal tenderness. There is no right CVA tenderness, left CVA tenderness or guarding.  Musculoskeletal:        General: Normal range of motion.     Cervical back: Normal  range of motion.  Skin:    General: Skin is warm and dry.     Capillary Refill: Capillary refill takes less than 2 seconds.  Neurological:     General: No focal deficit present.     Mental Status: She is alert and oriented to person, place, and time.     Coordination: Coordination normal.     Gait: Gait normal.  Psychiatric:        Mood and Affect: Mood normal.        Behavior: Behavior normal.       Assessment & Plan:  Urinary tract infection without hematuria, site unspecified Assessment & Plan: Keflex 500 mg x 7 days Urinalysis, NuSwab and urine culture ordered- Awaiting results will follow up. May take OTC AZO for urinary pain relief. Explained to increase oral fluid intake. Drink 8 glasses of water daily.  Follow up for worsening or persistent symptoms. Patient verbalizes understanding regarding plan of care and all questions answered.   Orders: -     Cephalexin; Take 1 capsule (500 mg total) by mouth 2 (two) times daily for 7 days.  Dispense: 14 capsule; Refill: 0  Urinary frequency -     Urinalysis -     Urine Culture -     NuSwab Vaginitis Plus (VG+)    Return if symptoms worsen or fail to  improve.   Cruzita Lederer Newman Nip, FNP

## 2022-09-16 NOTE — Patient Instructions (Signed)

## 2022-09-16 NOTE — Assessment & Plan Note (Signed)
Keflex 500 mg x 7 days Urinalysis, NuSwab and urine culture ordered- Awaiting results will follow up. May take OTC AZO for urinary pain relief. Explained to increase oral fluid intake. Drink 8 glasses of water daily.  Follow up for worsening or persistent symptoms. Patient verbalizes understanding regarding plan of care and all questions answered.

## 2022-09-17 LAB — URINALYSIS
Bilirubin, UA: NEGATIVE
Nitrite, UA: NEGATIVE
RBC, UA: NEGATIVE
Specific Gravity, UA: 1.026 (ref 1.005–1.030)
Urobilinogen, Ur: 0.2 mg/dL (ref 0.2–1.0)
pH, UA: 6 (ref 5.0–7.5)

## 2022-09-18 LAB — URINE CULTURE

## 2022-09-19 LAB — NUSWAB VAGINITIS PLUS (VG+)
Candida albicans, NAA: NEGATIVE
Candida glabrata, NAA: NEGATIVE
Chlamydia trachomatis, NAA: NEGATIVE
Neisseria gonorrhoeae, NAA: NEGATIVE
Trich vag by NAA: NEGATIVE

## 2022-09-20 ENCOUNTER — Telehealth: Payer: Self-pay | Admitting: Family Medicine

## 2022-09-20 NOTE — Telephone Encounter (Signed)
Patient called for urine test results.

## 2022-09-21 NOTE — Telephone Encounter (Signed)
Urine culture positive for bacteria. The antibiotic I prescribed treats this infection- it is important to complete antibiotic course . If patient still having symptoms  follow up with PCP

## 2022-09-21 NOTE — Telephone Encounter (Signed)
Spoke with patient.

## 2022-09-28 ENCOUNTER — Ambulatory Visit: Payer: Medicare HMO | Admitting: Internal Medicine

## 2022-09-29 ENCOUNTER — Other Ambulatory Visit: Payer: Self-pay | Admitting: Gastroenterology

## 2022-09-30 ENCOUNTER — Ambulatory Visit: Payer: Medicare HMO | Admitting: Family Medicine

## 2022-10-05 ENCOUNTER — Ambulatory Visit (INDEPENDENT_AMBULATORY_CARE_PROVIDER_SITE_OTHER): Payer: Medicare HMO | Admitting: Family Medicine

## 2022-10-05 ENCOUNTER — Encounter: Payer: Self-pay | Admitting: Family Medicine

## 2022-10-05 VITALS — BP 123/72 | HR 97 | Ht 65.0 in | Wt 177.0 lb

## 2022-10-05 DIAGNOSIS — R399 Unspecified symptoms and signs involving the genitourinary system: Secondary | ICD-10-CM | POA: Diagnosis not present

## 2022-10-05 LAB — POCT URINALYSIS DIP (CLINITEK)
Bilirubin, UA: NEGATIVE
Blood, UA: NEGATIVE
Glucose, UA: >=1000 mg/dL — AB
Leukocytes, UA: NEGATIVE
Nitrite, UA: NEGATIVE
POC PROTEIN,UA: 30 — AB
Spec Grav, UA: >=1.03 — AB (ref 1.010–1.025)
Urobilinogen, UA: 0.2 E.U./dL
pH, UA: 5.5 (ref 5.0–8.0)

## 2022-10-05 MED ORDER — PHENAZOPYRIDINE HCL 200 MG PO TABS
200.0000 mg | ORAL_TABLET | Freq: Three times a day (TID) | ORAL | 0 refills | Status: AC | PRN
Start: 2022-10-05 — End: 2022-10-07

## 2022-10-05 NOTE — Progress Notes (Signed)
Acute Office Visit  Subjective:    Patient ID: Jennifer Blackburn, female    DOB: 12-06-1945, 77 y.o.   MRN: [id number]  Chief Complaint  Patient presents with   Urinary Tract Infection    Pt reports recurrent uti sx.     HPI Patient is in today for with complaints of a slight burning sensation when she urinates sometimes.  No symptoms of pregnancy, urgency, or pain with urination.  No fever or chills reported.  Past Medical History:  Diagnosis Date   Anxiety    Arthritis    CAD (coronary artery disease)    Depression    Diabetes mellitus type II    without complication   DJD (degenerative joint disease) of lumbar spine    Hypercholesteremia    Hyperlipidemia    Hypertension    benign    Hypothyroidism    Thyroid cancer (HCC) 2001    Past Surgical History:  Procedure Laterality Date   ABDOMINAL HYSTERECTOMY     CARDIAC CATHETERIZATION     CATARACT EXTRACTION W/PHACO Right 07/17/2012   Procedure: CATARACT EXTRACTION PHACO AND INTRAOCULAR LENS PLACEMENT (IOC);  Surgeon: Gemma Payor, MD;  Location: [facility];  Service: Ophthalmology;  Laterality: Right;  CDE:25.51   CATARACT EXTRACTION W/PHACO Left 09/17/2016   Procedure: CATARACT EXTRACTION PHACO AND INTRAOCULAR LENS PLACEMENT LEFT EYE;  Surgeon: Gemma Payor, MD;  Location: [facility];  Service: Ophthalmology;  Laterality: Left;  CDE: 19.23   COLONOSCOPY     COLONOSCOPY N/A 01/15/2014   Procedure: COLONOSCOPY;  Surgeon: Corbin Ade, MD;  Location: [facility];  Service: Endoscopy;  Laterality: N/A;  9:00 AM   COLONOSCOPY WITH PROPOFOL N/A 01/25/2019   Procedure: COLONOSCOPY WITH PROPOFOL;  Surgeon: Corbin Ade, MD; diverticulosis in the entire colon, otherwise normal exam.  No repeat colonoscopy due to age.   DOPPLER ECHOCARDIOGRAPHY     ESOPHAGOGASTRODUODENOSCOPY (EGD) WITH PROPOFOL N/A 01/25/2019   Procedure: ESOPHAGOGASTRODUODENOSCOPY (EGD) WITH PROPOFOL;  Surgeon: Corbin Ade, MD; normal esophagus without dilation  due to inability to pass dilator beyond the hypopharynx, 1 small nonbleeding gastric ulcer s/p biopsied, otherwise normal exam.  Pathology with ulcer with reactive changes, no H. pylori, metaplasia, dysplasia, or malignancy.   ESOPHAGOGASTRODUODENOSCOPY (EGD) WITH PROPOFOL N/A 08/21/2019   Procedure: ESOPHAGOGASTRODUODENOSCOPY (EGD) WITH PROPOFOL;  Surgeon: Corbin Ade, MD; Normal esophagus. Previously noted gastric ulcer completely healed. Normal examined duodenum.    JOINT REPLACEMENT  07/01/2010   left hip   KNEE SURGERY Right    arthroscopy   left hip replaced  07/01/2010   SPINE SURGERY  2006   cervical   stress dipyridamole myocardial perfusion     THYROIDECTOMY     TONSILLECTOMY     VESICOVAGINAL FISTULA CLOSURE W/ TAH      Family History  Problem Relation Age of Onset   Heart attack Father    Heart failure Mother    Asthma Daughter    Sleep apnea Son        CPAP   Colon cancer Neg Hx     Social History   Socioeconomic History   Marital status: Married    Spouse name: Not on file   Number of children: 2   Years of education: 14   Highest education level: Some college, no degree  Occupational History   Occupation: retired     Comment: bank  Tobacco Use   Smoking status: Never    Passive exposure: Yes   Smokeless tobacco:  Never   Tobacco comments:    Husband smokes in the home  Vaping Use   Vaping status: Never Used  Substance and Sexual Activity   Alcohol use: No   Drug use: No   Sexual activity: Not Currently  Other Topics Concern   Not on file  Social History Narrative   Lives with husband, at home   No caffeine   Social Determinants of Health   Financial Resource Strain: Low Risk  (06/10/2022)   Overall Financial Resource Strain (CARDIA)    Difficulty of Paying Living Expenses: Not hard at all  Food Insecurity: No Food Insecurity (06/10/2022)   Hunger Vital Sign    Worried About Running Out of Food in the Last Year: Never true    Ran Out of Food  in the Last Year: Never true  Transportation Needs: No Transportation Needs (06/10/2022)   PRAPARE - Administrator, Civil Service (Medical): No    Lack of Transportation (Non-Medical): No  Physical Activity: Insufficiently Active (09/13/2022)   Exercise Vital Sign    Days of Exercise per Week: 3 days    Minutes of Exercise per Session: 20 min  Stress: No Stress Concern Present (06/10/2022)   [employer] of Occupational Health - Occupational Stress Questionnaire    Feeling of Stress : Not at all  Social Connections: Socially Integrated (06/10/2022)   Social Connection and Isolation Panel [NHANES]    Frequency of Communication with Friends and Family: More than three times a week    Frequency of Social Gatherings with Friends and Family: Twice a week    Attends Religious Services: More than 4 times per year    Active Member of [employer] or Organizations: Yes    Attends Engineer, structural: More than 4 times per year    Marital Status: Married  Catering manager Violence: Not At Risk (08/31/2021)   Humiliation, Afraid, Rape, and Kick questionnaire    Fear of Current or Ex-Partner: No    Emotionally Abused: No    Physically Abused: No    Sexually Abused: No    Outpatient Medications Prior to Visit  Medication Sig Dispense Refill   ACCU-CHEK GUIDE test strip USE AS INSTRUCTED TO CHECK BLOOD SUGAR 2 TIMES PER DAY DX CODE E11.65 100 strip 3   Cetirizine HCl 10 MG TBDP Take one tablet by mouth two times daily for allergy (Patient taking differently: Take 1 tablet by mouth daily.) 60 tablet 5   Cholecalciferol (VITAMIN D3) 50 MCG (2000 UT) TABS Take 2,000 Units by mouth daily.     gabapentin (NEURONTIN) 100 MG capsule Take one capsule twice daily and three at bedtime 150 capsule 1   hydrOXYzine (ATARAX) 10 MG tablet Take one tablet twice daily and two tablets at bedtime as needed for generalized itching. 360 tablet 1   imipramine (TOFRANIL) 50 MG tablet TAKE 1 TABLET AT  BEDTIME 90 tablet 2   insulin aspart (NOVOLOG FLEXPEN) 100 UNIT/ML FlexPen Inject 10-15 Units into the skin 3 (three) times daily with meals. (Patient taking differently: Inject 15 Units into the skin 3 (three) times daily with meals.) 15 mL 2   insulin glargine, 1 Unit Dial, (TOUJEO SOLOSTAR) 300 UNIT/ML Solostar Pen INJECT 30-50 UNITS TWO TIMES DAILY 9 mL 2   Insulin Pen Needle 32G X 4 MM MISC Use 4x a day 300 each 3   isosorbide mononitrate (IMDUR) 30 MG 24 hr tablet TAKE HALF A TABLET BY MOUTH DAILY 45 tablet 3  Lancets (ACCU-CHEK MULTICLIX) lancets Use as instructed three times daily dx 250.01 100 each 5   levothyroxine (SYNTHROID) 125 MCG tablet TAKE 1 TABLET BY MOUTH EVERY DAY BEFORE BREAKFAST 90 tablet 3   metFORMIN (GLUCOPHAGE) 1000 MG tablet TAKE 1 TABLET TWICE DAILY 180 tablet 1   metoprolol tartrate (LOPRESSOR) 50 MG tablet TAKE 1 TABLET BY MOUTH TWICE A DAY (Patient taking differently: Take 25 mg by mouth in the morning and at bedtime.) 180 tablet 3   ondansetron (ZOFRAN-ODT) 8 MG disintegrating tablet 8mg  ODT q4 hours prn nausea 10 tablet 0   pantoprazole (PROTONIX) 40 MG tablet TAKE 1 TABLET BY MOUTH EVERY DAY 90 tablet 1   PARoxetine (PAXIL) 20 MG tablet TAKE 1 TABLET EVERY DAY 90 tablet 3   pravastatin (PRAVACHOL) 80 MG tablet TAKE 1 TABLET EVERY DAY 90 tablet 3   Semaglutide, 1 MG/DOSE, 4 MG/3ML SOPN Inject 1 mg as directed once a week. 3 mL 1   No facility-administered medications prior to visit.    Allergies  Allergen Reactions   Lipitor [Atorvastatin Calcium] Other (See Comments)    Muscle aches   Actos [Pioglitazone] Other (See Comments)    Peripheral edema   Crestor [Rosuvastatin] Other (See Comments)    Muscle aches   Daypro [Oxaprozin] Hives   Nsaids Hives   Sulfonamide Derivatives Hives    Review of Systems  Constitutional:  Negative for chills and fever.  Eyes:  Negative for visual disturbance.  Respiratory:  Negative for chest tightness and shortness of  breath.   Genitourinary:  Negative for dysuria, frequency and urgency.  Neurological:  Negative for dizziness and headaches.       Objective:    Physical Exam HENT:     Head: Normocephalic.     Mouth/Throat:     Mouth: Mucous membranes are moist.  Cardiovascular:     Rate and Rhythm: Normal rate.     Heart sounds: Normal heart sounds.  Pulmonary:     Effort: Pulmonary effort is normal.     Breath sounds: Normal breath sounds.  Abdominal:     Tenderness: There is no abdominal tenderness. There is no right CVA tenderness.  Neurological:     Mental Status: She is alert.     BP 123/72   Pulse 97   Ht 5\' 5"  (1.651 m)   Wt 177 lb 0.6 oz (80.3 kg)   LMP 11/11/2016   SpO2 92%   BMI 29.46 kg/m  Wt Readings from Last 3 Encounters:  10/05/22 177 lb 0.6 oz (80.3 kg)  09/16/22 178 lb (80.7 kg)  09/13/22 177 lb (80.3 kg)       Assessment & Plan:  UTI symptoms Assessment & Plan: UA is negative for leukocytes and nitrates Pending culture Will treat with Pyridium 200 mg 3 times daily for 2 days for bladder irritation She was recently restarted on Ozempic 1 mg daily weekly for her type 2 diabetes She reports elevated blood sugars as of lately with the highest 320 and lowest to 260 She is following up with her endocrinologist on 10/11/2022 Encouraged to follow-up with PCP in the week Reviewed diabetes and nutrition and encouraged to avoid high sugar foods and beverages with increased physical activity   Orders: -     POCT URINALYSIS DIP (CLINITEK) -     Urine Culture -     Phenazopyridine HCl; Take 1 tablet (200 mg total) by mouth 3 (three) times daily as needed for up to 2 days for  pain.  Dispense: 6 tablet; Refill: 0  Note: This chart has been completed using Engineer, civil (consulting) software, and while attempts have been made to ensure accuracy, certain words and phrases may not be transcribed as intended.    Gilmore Laroche, FNP

## 2022-10-05 NOTE — Patient Instructions (Addendum)
I appreciate the opportunity to provide care to you today!    Follow up:  1 week with PCP  Labs: please stop by the lab today to get your blood drawn (CBC, CMP, TSH, Lipid profile, HgA1c, Vit D)  Please start taking Pyridium  to relief symptoms of bladder irration Pyridium can cause a reddish-orange discoloration of the urine. This is normal and not harmful, but it can stain clothing and contact lenses. Please wear a pad while taking this medication   Attached with your AVS, you will find valuable resources for self-education. I highly recommend dedicating some time to thoroughly examine them.   Please continue to a heart-healthy diet and increase your physical activities. Try to exercise for at least five days a week.    It was a pleasure to see you and I look forward to continuing to work together on your health and well-being. Please do not hesitate to call the office if you need care or have questions about your care.  In case of emergency, please visit the Emergency Department for urgent care, or contact our clinic at [phone] to schedule an appointment. We're here to help you!   Have a wonderful day and week. With Gratitude, Gilmore Laroche MSN, FNP-BC

## 2022-10-05 NOTE — Assessment & Plan Note (Addendum)
UA is negative for leukocytes and nitrates Pending culture Will treat with Pyridium 200 mg 3 times daily for 2 days for bladder irritation She was recently restarted on Ozempic 1 mg daily weekly for her type 2 diabetes She reports elevated blood sugars as of lately with the highest 320 and lowest to 260 She is following up with her endocrinologist on 10/11/2022 Encouraged to follow-up with PCP in the week Reviewed diabetes and nutrition and encouraged to avoid high sugar foods and beverages with increased physical activity

## 2022-10-06 ENCOUNTER — Telehealth: Payer: Self-pay

## 2022-10-06 NOTE — Telephone Encounter (Signed)
J, Per my records she is on: - Metformin 1000 mg 2x a day with meals. - Toujeo 30 units in a.m. and 40 units at bedtime - Novolog 10-15 units before each meal   Along with Ozempic. Let's increase her NovoLog to 20-25 units for meals until sugars improve. Ty! C

## 2022-10-06 NOTE — Telephone Encounter (Signed)
Pt has been notified and voices understanding and will be here on Monday for her appt.

## 2022-10-06 NOTE — Telephone Encounter (Signed)
Patient called stating that her blood sugar has been elevated and she is taking her Ozempic and all other medications. She was diagnosed with a UTI a few days ago and took abx for it and it has cleared. She went ad verified it with her pcp yesterday but she wants to know what to do about her blood sugars now. Her lowest was this morning (reading: 205).  Past few days readings: 320,330,300

## 2022-10-11 ENCOUNTER — Encounter: Payer: Self-pay | Admitting: Internal Medicine

## 2022-10-11 ENCOUNTER — Ambulatory Visit: Payer: Medicare HMO | Admitting: Internal Medicine

## 2022-10-11 VITALS — BP 120/80 | HR 82 | Ht 65.0 in | Wt 176.8 lb

## 2022-10-11 DIAGNOSIS — Z7985 Long-term (current) use of injectable non-insulin antidiabetic drugs: Secondary | ICD-10-CM

## 2022-10-11 DIAGNOSIS — E1159 Type 2 diabetes mellitus with other circulatory complications: Secondary | ICD-10-CM | POA: Diagnosis not present

## 2022-10-11 DIAGNOSIS — C73 Malignant neoplasm of thyroid gland: Secondary | ICD-10-CM

## 2022-10-11 DIAGNOSIS — Z794 Long term (current) use of insulin: Secondary | ICD-10-CM | POA: Diagnosis not present

## 2022-10-11 DIAGNOSIS — E119 Type 2 diabetes mellitus without complications: Secondary | ICD-10-CM

## 2022-10-11 DIAGNOSIS — Z7984 Long term (current) use of oral hypoglycemic drugs: Secondary | ICD-10-CM

## 2022-10-11 DIAGNOSIS — E890 Postprocedural hypothyroidism: Secondary | ICD-10-CM

## 2022-10-11 LAB — HEMOGLOBIN A1C: Hemoglobin A1C: 11.2

## 2022-10-11 LAB — URINE CULTURE

## 2022-10-11 NOTE — Patient Instructions (Addendum)
Please continue: - Metformin 1000 mg 2x a day with meals. - Toujeo 30 units in a.m. and 40 units at bedtime - Ozempic 1 mg weekly  Use: - Novolog 25-30 units before each meal   Please continue levothyroxine 125 mcg daily.  Take the thyroid hormone every day, with water, at least 30 minutes before breakfast, separated by at least 4 hours from: - acid reflux medications - calcium - iron - multivitamins  Please return in 3 months with your sugar log.

## 2022-10-11 NOTE — Progress Notes (Addendum)
Patient ID: Jennifer Blackburn, female   DOB: 08-30-1945, 77 y.o.   MRN: [id number]   HPI: Jennifer Blackburn is a 77 y.o.-year-old female, returning for follow-up for DM2, dx in 2001, insulin-dependent since 2017, uncontrolled, with long-term complications (DR, CAD) and also papillary thyroid cancer and postsurgical hypothyroidism.  Last visit 5 months ago.  Interim history: No increased urination, blurry vision, chest pain. Her nausea and vomiting resolved since last visit. She stopped lemonade since last visit. She was out of town x 1 week 4 weeks ago >> forgot her NovoLog... She had a UTI >> was on ABx, sugars were higher.  DM2: Reviewed HbA1c levels: Lab Results  Component Value Date   HGBA1C 9.4 (A) 06/08/2022   HGBA1C 9.1 (A) 12/31/2021   HGBA1C 8.8 (A) 08/27/2021   HGBA1C 8.8 (A) 04/28/2021   HGBA1C 10.1 (A) 12/23/2020   HGBA1C 9.0 (A) 08/07/2020   HGBA1C 10.5 (A) 05/09/2020   HGBA1C 9.7 (A) 01/04/2020   HGBA1C 8.4 (A) 08/29/2019   HGBA1C 9.3 (A) 05/03/2019  01/12/2018: HbA1c 8.7% 08/05/2017: HbA1c 8.9% 07/11/2017: HbA1c 9.1%  Pt is on a regimen of: - Metformin 1000 mg 2x a day with meals. - Toujeo 30 units in a.m. and 50 units at bedtime >> 30 units in a.m. and 40 units at bedtime -  >> NovoLog 10 -15 units before/after each meal >> not consistently >> 20-25 units before meals - Ozempic 0.5 >> off >> 1 >> 2 > 1 mg weekly (through the patient assistance program)  She was on Ozempic 0.5 mg weekly in a.m. >> 1 mg  - constipation >> stopped 06/2018.  We added it back 09/2018. She was on Victoza >> $$$. She was on Jardiance >> $$$. She was on Actos >> swelling - stopped Spring 2019. She was on Glipizide XL 5 mg daily >> stopped 07/2017.  We retried glipizide 5 mg twice a day but had to stop 12/2018 due to lack of effect.  Pt checks her sugars 1-2 times a day: - am: 180-200 >> 120-130 >> 96, 110-160 >> 120-201 >> 110-180 >> 260s, after increasing insulin: 150, 170 - 2h after  b'fast: n/c - before lunch: n/c  >> 200 - 2h after lunch: 265-300s >> n/c >> 200s >> 200s >> n/c >> 130-180 >> n/c >> 337 - before dinner: n/c >> 185-190 >> n/c  - 2h after dinner: n/c >> 150-188 >> n/c >> 200s - bedtime:  160-180 >> 170-180s >> 160s >> 140-210 >> n/c - nighttime: n/c  Lowest sugar was 170 >> 86 >>90s >> 110; she has hypoglycemia awareness in the 60s. Highest sugar was 200 >> 180s >> 200s >> 200s  Glucometer: AccuChek Aviva  Pt's meals are: - Breakfast:toast, cereal, milk; oatmeal; bacon + egg >> oatmeal + coffee >> cereals - Lunch: skips >> 2 eggs, fruit - Dinner: hamburger, or chicken, or fish, + veggies, bread, water and tea - Snacks: 2- fruit She was seeing a dietitian in [place].  -No CKD, last BUN/creatinine:  Lab Results  Component Value Date   BUN 11 09/09/2022   BUN 11 06/02/2022   CREATININE 0.72 09/09/2022   CREATININE 0.89 06/02/2022  On quinapril.  -+ HL; last set of lipids: Lab Results  Component Value Date   CHOL 202 (H) 09/09/2022   HDL 45 09/09/2022   LDLCALC 124 (H) 09/09/2022   TRIG 187 (H) 09/09/2022   CHOLHDL 4.5 (H) 09/09/2022  On pravastatin 80.  - last eye exam  was 04/08/2022: + DR . Dr. Montez Morita in [place].  She has cataracts OU.   -No numbness and tingling in her feet. Has a podiatrist.  Last foot exam  12/2021.  On ASA 81.  Pt has no FH of DM.  H/o Thyroid cancer (2001), s/p RAI treatment, now with postsurgical hypothyroidism.  Previously on brand-name Synthroid, now generic levothyroxine.  Pt is on levothyroxine 125 mcg daily (dose decreased 08/2021), taken: - in am - fasting - at least 30 min from b'fast - no calcium - no iron - no multivitamins - + PPIs (Protonix) moved to pm - not on Biotin  Reviewed her TFTs: Lab Results  Component Value Date   TSH 0.72 12/31/2021   TSH 0.06 (L) 08/27/2021   TSH 0.40 08/07/2020   TSH 1.60 05/09/2020   TSH 0.05 (L) 08/31/2019   TSH 0.04 (L) 12/28/2018   TSH 0.06  (L) 09/21/2018   TSH 0.61 05/26/2018   TSH 2.49 03/23/2016   TSH 0.070 (L) 10/13/2010   TSH 0.035 (L) 09/22/2006   TSH 0.015 (L) 06/17/2006  11/03/2017: TSH 0.044  Her thyroglobulin levels are detectable: Lab Results  Component Value Date   THYROGLB 0.3 (L) 08/27/2021   THYROGLB 0.4 (L) 08/07/2020   THYROGLB 1.2 (L) 05/09/2020   THYROGLB 0.4 (L) 08/31/2019   THYROGLB 0.3 (L) 09/21/2018   THYROGLB 0.7 (L) 05/26/2018   Lab Results  Component Value Date   THGAB <1 08/27/2021   THGAB <1 08/07/2020   THGAB <1 05/09/2020   THGAB <1 08/31/2019   THGAB <1 09/21/2018   THGAB <1 05/26/2018   Reviewed records from Dr. Patrecia Pace: Patient is status post total thyroidectomy in 2004 1.3 cm papillary thyroid cancer of the right lobe, followed by 3x RAI tx's with  33 mCi I-131 and a fourth RAI dose inpatient: 125 mCi, at [facility].    Whole-body scan on 09/08/2007 showed an increase in the thyroid bed activity and the follow up study with PET scan 10/08/2007 showed mildly hypermetabolic cervical lymph nodes.    Repeat PET 05/10/2008 showed no significant changes felt likely to be due to to a reactive process.  11/03/2017: TSH 0.044, thyroglobulin 0.2, ATA <1.0  04/05/2017: TSH 0.021 (0.45-4.5), thyroglobulin 0.3 (by IMA), ATA <1.0  Reviewed imaging test reports in the system: Thyroid ultrasound (06/10/2018): 1.1 cm nonspecific calcified lesion in the right upper neck.  CT was recommended: There is no residual or recurrent tissue in the right or left thyroid beds. There is no evidence of abnormal adenopathy by short axis diameter measurement criteria There is a nonspecific calcified soft tissue mass measuring 1.1 cm in the right superior neck. This is of unknown significance. CT neck can be performed to further characterize.  CT (10/18/2018): Status post thyroidectomy. No soft tissue mass within the thyroidectomy bed. A 13 x 7 mm ovoid focus of calcification in the right aspect of the thyroidectomy  bed likely corresponding with the finding on recent neck ultrasound and is unchanged as compared to neck CT 10/24/2007, benign.   No pathologically enlarged cervical chain lymph nodes. A nonenlarged calcified right supraclavicular lymph node is new from prior neck CT 10/24/2007 but also favored benign. Attention recommended on follow-up.  Neck U/S (06/03/2020): Ultrasound of the neck demonstrates no evidence of visible residual thyroid tissue or abnormal soft tissue mass.   Ovoid area of densely shadowing calcification again noted to the right of midline as seen by prior ultrasound and also demonstrated by prior CT studies of  the neck with documented stability between 2009 and 2020 studies.   Small left cervical lymph node has a similar appearance to the prior ultrasound study measuring 0.5 cm in short axis.   IMPRESSION: No evidence by ultrasound of recurrent thyroid malignancy in the neck.  Stable right neck calcification which has been documented to be benign and stable by prior CT.  Small left cervical lymph node demonstrates stable appearance and size by ultrasound  compared to the 2020 ultrasound.  Pt denies: - feeling nodules in neck - hoarseness - dysphagia - choking  She also has a history of HTN and anemia. She had an episode of gastroenteritis 05/28/2022. scan: diverticulitis, GB stones and kidney stones.  Lipase was normal.  ROS: + See HPI  I reviewed pt's medications, allergies, PMH, social hx, family hx, and changes were documented in the history of present illness. Otherwise, unchanged from my initial visit note.  Past Medical History:  Diagnosis Date   Anxiety    Arthritis    CAD (coronary artery disease)    Depression    Diabetes mellitus type II    without complication   DJD (degenerative joint disease) of lumbar spine    Hypercholesteremia    Hyperlipidemia    Hypertension    benign    Hypothyroidism    Thyroid cancer (HCC) 2001   Past Surgical  History:  Procedure Laterality Date   ABDOMINAL HYSTERECTOMY     CARDIAC CATHETERIZATION     CATARACT EXTRACTION W/PHACO Right 07/17/2012   Procedure: CATARACT EXTRACTION PHACO AND INTRAOCULAR LENS PLACEMENT (IOC);  Surgeon: Gemma Payor, MD;  Location: [facility];  Service: Ophthalmology;  Laterality: Right;  CDE:25.51   CATARACT EXTRACTION W/PHACO Left 09/17/2016   Procedure: CATARACT EXTRACTION PHACO AND INTRAOCULAR LENS PLACEMENT LEFT EYE;  Surgeon: Gemma Payor, MD;  Location: [facility];  Service: Ophthalmology;  Laterality: Left;  CDE: 19.23   COLONOSCOPY     COLONOSCOPY N/A 01/15/2014   Procedure: COLONOSCOPY;  Surgeon: Corbin Ade, MD;  Location: [facility] SU[facility];  Service: Endoscopy;  Laterality: N/A;  9:00 AM   COLONOSCOPY WITH PROPOFOL N/A 01/25/2019   Procedure: COLONOSCOPY WITH PROPOFOL;  Surgeon: Corbin Ade, MD; diverticulosis in the entire colon, otherwise normal exam.  No repeat colonoscopy due to age.   DOPPLER ECHOCARDIOGRAPHY     ESOPHAGOGASTRODUODENOSCOPY (EGD) WITH PROPOFOL N/A 01/25/2019   Procedure: ESOPHAGOGASTRODUODENOSCOPY (EGD) WITH PROPOFOL;  Surgeon: Corbin Ade, MD; normal esophagus without dilation due to inability to pass dilator beyond the hypopharynx, 1 small nonbleeding gastric ulcer s/p biopsied, otherwise normal exam.  Pathology with ulcer with reactive changes, no H. pylori, metaplasia, dysplasia, or malignancy.   ESOPHAGOGASTRODUODENOSCOPY (EGD) WITH PROPOFOL N/A 08/21/2019   Procedure: ESOPHAGOGASTRODUODENOSCOPY (EGD) WITH PROPOFOL;  Surgeon: Corbin Ade, MD; Normal esophagus. Previously noted gastric ulcer completely healed. Normal examined duodenum.    JOINT REPLACEMENT  07/01/2010   left hip   KNEE SURGERY Right    arthroscopy   left hip replaced  07/01/2010   SPINE SURGERY  2006   cervical   stress dipyridamole myocardial perfusion     THYROIDECTOMY     TONSILLECTOMY     VESICOVAGINAL FISTULA CLOSURE W/ TAH     Social History   Socioeconomic  History   Marital status: Married    Spouse name: Not on file   Number of children: 2   Years of education: 14   Highest education level: Some college, no degree  Occupational History   Occupation:  retired     Comment: bank  Tobacco Use   Smoking status: Never    Passive exposure: Yes   Smokeless tobacco: Never   Tobacco comments:    Husband smokes in the home  Vaping Use   Vaping status: Never Used  Substance and Sexual Activity   Alcohol use: No   Drug use: No   Sexual activity: Not Currently  Other Topics Concern   Not on file  Social History Narrative   Lives with husband, at home   No caffeine   Social Determinants of Health   Financial Resource Strain: Low Risk  (06/10/2022)   Overall Financial Resource Strain (CARDIA)    Difficulty of Paying Living Expenses: Not hard at all  Food Insecurity: No Food Insecurity (06/10/2022)   Hunger Vital Sign    Worried About Running Out of Food in the Last Year: Never true    Ran Out of Food in the Last Year: Never true  Transportation Needs: No Transportation Needs (06/10/2022)   PRAPARE - Administrator, Civil Service (Medical): No    Lack of Transportation (Non-Medical): No  Physical Activity: Insufficiently Active (09/13/2022)   Exercise Vital Sign    Days of Exercise per Week: 3 days    Minutes of Exercise per Session: 20 min  Stress: No Stress Concern Present (06/10/2022)   [employer] of Occupational Health - Occupational Stress Questionnaire    Feeling of Stress : Not at all  Social Connections: Socially Integrated (06/10/2022)   Social Connection and Isolation Panel [NHANES]    Frequency of Communication with Friends and Family: More than three times a week    Frequency of Social Gatherings with Friends and Family: Twice a week    Attends Religious Services: More than 4 times per year    Active Member of [employer] or Organizations: Yes    Attends Engineer, structural: More than 4 times per year     Marital Status: Married  Catering manager Violence: Not At Risk (08/31/2021)   Humiliation, Afraid, Rape, and Kick questionnaire    Fear of Current or Ex-Partner: No    Emotionally Abused: No    Physically Abused: No    Sexually Abused: No   Current Outpatient Medications  Medication Sig Dispense Refill   ACCU-CHEK GUIDE test strip USE AS INSTRUCTED TO CHECK BLOOD SUGAR 2 TIMES PER DAY DX CODE E11.65 100 strip 3   Cetirizine HCl 10 MG TBDP Take one tablet by mouth two times daily for allergy (Patient taking differently: Take 1 tablet by mouth daily.) 60 tablet 5   Cholecalciferol (VITAMIN D3) 50 MCG (2000 UT) TABS Take 2,000 Units by mouth daily.     gabapentin (NEURONTIN) 100 MG capsule Take one capsule twice daily and three at bedtime 150 capsule 1   hydrOXYzine (ATARAX) 10 MG tablet Take one tablet twice daily and two tablets at bedtime as needed for generalized itching. 360 tablet 1   imipramine (TOFRANIL) 50 MG tablet TAKE 1 TABLET AT BEDTIME 90 tablet 2   insulin aspart (NOVOLOG FLEXPEN) 100 UNIT/ML FlexPen Inject 10-15 Units into the skin 3 (three) times daily with meals. (Patient taking differently: Inject 15 Units into the skin 3 (three) times daily with meals.) 15 mL 2   insulin glargine, 1 Unit Dial, (TOUJEO SOLOSTAR) 300 UNIT/ML Solostar Pen INJECT 30-50 UNITS TWO TIMES DAILY 9 mL 2   Insulin Pen Needle 32G X 4 MM MISC Use 4x a day 300 each 3  isosorbide mononitrate (IMDUR) 30 MG 24 hr tablet TAKE HALF A TABLET BY MOUTH DAILY 45 tablet 3   Lancets (ACCU-CHEK MULTICLIX) lancets Use as instructed three times daily dx 250.01 100 each 5   levothyroxine (SYNTHROID) 125 MCG tablet TAKE 1 TABLET BY MOUTH EVERY DAY BEFORE BREAKFAST 90 tablet 3   metFORMIN (GLUCOPHAGE) 1000 MG tablet TAKE 1 TABLET TWICE DAILY 180 tablet 1   metoprolol tartrate (LOPRESSOR) 50 MG tablet TAKE 1 TABLET BY MOUTH TWICE A DAY (Patient taking differently: Take 25 mg by mouth in the morning and at bedtime.) 180  tablet 3   ondansetron (ZOFRAN-ODT) 8 MG disintegrating tablet 8mg  ODT q4 hours prn nausea 10 tablet 0   pantoprazole (PROTONIX) 40 MG tablet TAKE 1 TABLET BY MOUTH EVERY DAY 90 tablet 1   PARoxetine (PAXIL) 20 MG tablet TAKE 1 TABLET EVERY DAY 90 tablet 3   pravastatin (PRAVACHOL) 80 MG tablet TAKE 1 TABLET EVERY DAY 90 tablet 3   Semaglutide, 1 MG/DOSE, 4 MG/3ML SOPN Inject 1 mg as directed once a week. 3 mL 1   No current facility-administered medications for this visit.     Allergies  Allergen Reactions   Lipitor [Atorvastatin Calcium] Other (See Comments)    Muscle aches   Actos [Pioglitazone] Other (See Comments)    Peripheral edema   Crestor [Rosuvastatin] Other (See Comments)    Muscle aches   Daypro [Oxaprozin] Hives   Nsaids Hives   Sulfonamide Derivatives Hives   Family History  Problem Relation Age of Onset   Heart attack Father    Heart failure Mother    Asthma Daughter    Sleep apnea Son        CPAP   Colon cancer Neg Hx    PE: BP 120/80   Pulse 82   Ht 5\' 5"  (1.651 m)   Wt 176 lb 12.8 oz (80.2 kg)   LMP 11/11/2016   SpO2 99%   BMI 29.42 kg/m  Wt Readings from Last 3 Encounters:  10/11/22 176 lb 12.8 oz (80.2 kg)  10/05/22 177 lb 0.6 oz (80.3 kg)  09/16/22 178 lb (80.7 kg)   Constitutional: overweight, in NAD Eyes: no exophthalmos ENT: no neck masses palpated, no cervical lymphadenopathy Cardiovascular: RRR, No MRG Respiratory: CTA B Musculoskeletal: no deformities Skin:  no rashes Neurological: no tremor with outstretched hands Diabetic Foot Exam - Simple   Simple Foot Form Diabetic Foot exam was performed with the following findings: Yes 10/11/2022  2:31 PM  Visual Inspection No deformities, no ulcerations, no other skin breakdown bilaterally: Yes Sensation Testing Intact to touch and monofilament testing bilaterally: Yes Pulse Check Posterior Tibialis and Dorsalis pulse intact bilaterally: Yes Comments    ASSESSMENT: 1. DM2,  insulin-dependent, uncontrolled, with long-term complications - CAD - DR  2.  Papillary thyroid cancer  3.  Postsurgical hypothyroidism  PLAN:  1. Patient with longstanding, uncontrolled, type 2 diabetes, on metformin, basal bolus insulin regimen and weekly GLP-1 receptor agonist, restarted since last visit. At that time, she was off Ozempic due to having had an episode of gastroenteritis with nausea/vomiting/diarrhea.  Of note, lipase was normal.  I advised her to stay off Ozempic until she felt back to baseline.  She was able to restart it afterwards.  At last visit she was inconsistent with insulin doses and mealtimes.  I recommended to look up the CeQur simplicity insulin pump to help with compliance.  Sugars were higher than target in the morning and she was  taking a day.  I strongly advised her to start.  She was also drinking lemonade throughout the day and I advised her that she cannot drink any sweet drinks!  She was determined to improve her diet.  I did not recommend further changes in her regimen time.  HbA1c was higher, at 9.4%. -She called [place] with high blood sugars, in the 300s few days ago and we had to increase the doses of her NovoLog.  At today's visit, she tells me that sugars improved afterwards, not only due to the increase in dose but also to the fact that she started to take it consistently.  She was previously off when she was traveling out of town x1 week and forgot the medication at home.  After she returned, she was not taking NovoLog consistently.  She is not taking it before her main meals.  Discussed about the need to take it 15 minutes before the meal.  We also gust about trying not to eat or drink sweets for now until we can bring her blood sugars lower.  Will go ahead and increase the NovoLog doses a little bit more and I did advise her that when the sugars improved to target, she can start backing off the dose.  Will continue the same dose of metformin, Toujeo, and Ozempic  for now. - I suggested to:  Patient Instructions  Please continue: - Metformin 1000 mg 2x a day with meals. - Toujeo 30 units in a.m. and 40 units at bedtime - Ozempic 1 mg weekly  Use: - Novolog 25-30 units before each meal   Please continue levothyroxine 125 mcg daily.  Take the thyroid hormone every day, with water, at least 30 minutes before breakfast, separated by at least 4 hours from: - acid reflux medications - calcium - iron - multivitamins  Please return in 3 months with your sugar log.  - we checked her HbA1c: 11.2% (higher) - advised to check sugars at different times of the day - 4x a day, rotating check times - advised for yearly eye exams >> she is UTD - return to clinic in 3 months   2.  Papillary thyroid cancer -Previous thyroid cancer records were reviewed from Dr. Patrecia Pace -She has metastatic thyroid cancer and had 4 RAI treatments.  She also had increased signal on the PET scan from 2009, however, in 2010, another PET scan showed possible inflammatory lymph nodes in the area only -Her thyroglobulin levels were detectable but stable in the past.  She had a high level in 04/2020, but then returns to baseline. -On the latest neck ultrasound from 05/2020, there were no new concerning masses.  Previous neck ultrasound also showed a specific calcified region in the right upper neck and a 2019 neck CT s showed that the calcified mass was stable and most likely benign -At last check thyroglobulin was still detectable, but low -We will recheck her thyroglobulin and ATA now  3.  Postsurgical hypothyroidism - latest thyroid labs reviewed with pt. >> normal: Lab Results  Component Value Date   TSH 0.72 12/31/2021  - she continues on LT4 125 mcg daily - pt feels good on this dose. - we discussed about taking the thyroid hormone every day, with water, >30 minutes before breakfast, separated by >4 hours from acid reflux medications, calcium, iron, multivitamins. Pt. is  taking it correctly. - will check thyroid tests today: TSH and fT4 - If labs are abnormal, she will need to return for repeat  TFTs in 1.5 months  Component     Latest Ref Rng 10/11/2022  TSH     0.35 - 5.50 uIU/mL 0.63   T4,Free(Direct)     0.60 - 1.60 ng/dL 2.95   Normal TFTs.  Component     Latest Ref Rng 10/11/2022  Thyroglobulin     ng/mL 0.5 (L)   Comment --   Thyroglobulin Ab     < or = 1 IU/mL <1     Tg slightly higher but not by muck. Tg is still lower than the value obtained in 2020, for e.g.Will repeat this at next visit to check for trend.  Carlus Pavlov, MD PhD [facility] End[facility]

## 2022-10-12 ENCOUNTER — Encounter: Payer: Self-pay | Admitting: Internal Medicine

## 2022-10-12 LAB — THYROGLOBULIN LEVEL: Thyroglobulin: 0.5 ng/mL — ABNORMAL LOW

## 2022-10-12 LAB — TSH: TSH: 0.63 u[IU]/mL (ref 0.35–5.50)

## 2022-10-12 LAB — T4, FREE: Free T4: 1.17 ng/dL (ref 0.60–1.60)

## 2022-10-12 LAB — THYROGLOBULIN ANTIBODY: Thyroglobulin Ab: <1 IU/mL (ref <=1)

## 2022-10-15 ENCOUNTER — Ambulatory Visit: Payer: Medicare HMO | Admitting: Family Medicine

## 2022-10-26 ENCOUNTER — Encounter: Payer: Self-pay | Admitting: Family Medicine

## 2022-10-26 ENCOUNTER — Ambulatory Visit: Payer: Medicare HMO | Admitting: Family Medicine

## 2022-10-26 VITALS — BP 119/66 | HR 79 | Ht 65.0 in | Wt 178.0 lb

## 2022-10-26 DIAGNOSIS — I10 Essential (primary) hypertension: Secondary | ICD-10-CM

## 2022-10-26 DIAGNOSIS — Z794 Long term (current) use of insulin: Secondary | ICD-10-CM | POA: Diagnosis not present

## 2022-10-26 DIAGNOSIS — F439 Reaction to severe stress, unspecified: Secondary | ICD-10-CM | POA: Diagnosis not present

## 2022-10-26 DIAGNOSIS — E890 Postprocedural hypothyroidism: Secondary | ICD-10-CM | POA: Diagnosis not present

## 2022-10-26 DIAGNOSIS — E663 Overweight: Secondary | ICD-10-CM

## 2022-10-26 DIAGNOSIS — E782 Mixed hyperlipidemia: Secondary | ICD-10-CM

## 2022-10-26 DIAGNOSIS — E1159 Type 2 diabetes mellitus with other circulatory complications: Secondary | ICD-10-CM

## 2022-10-26 MED ORDER — EZETIMIBE 10 MG PO TABS: 10.0000 mg | ORAL_TABLET | Freq: Every day | ORAL | 5 refills | Status: DC

## 2022-10-26 NOTE — Assessment & Plan Note (Signed)
Jennifer Blackburn is reminded of the importance of commitment to daily physical activity for 30 minutes or more, as able and the need to limit carbohydrate intake to 30 to 60 grams per meal to help with blood sugar control.   The need to take medication as prescribed, test blood sugar as directed, and to call between visits if there is a concern that blood sugar is uncontrolled is also discussed.   Jennifer Blackburn is reminded of the importance of daily foot exam, annual eye examination, and good blood sugar, blood pressure and cholesterol control.     Latest Ref Rng & Units 09/09/2022   11:29 AM 06/11/2022   11:20 AM 06/08/2022    2:12 PM 06/02/2022   12:05 PM 05/28/2022    6:02 PM  Diabetic Labs  HbA1c 4.0 - 5.6 %   9.4     Micro/Creat Ratio 0 - 29 mg/g creat  11      Chol 100 - 199 mg/dL 161       HDL >09 mg/dL 45       Calc LDL 0 - 99 mg/dL 604       Triglycerides 0 - 149 mg/dL 540       Creatinine 9.81 - 1.00 mg/dL 1.91    4.78  2.95       10/26/2022    2:11 PM 10/11/2022    2:16 PM 10/05/2022    3:57 PM 09/16/2022    9:41 AM 09/13/2022    2:34 PM 07/14/2022   11:25 AM 06/22/2022    2:35 PM  BP/Weight  Systolic BP 119 120 123 130  112 104  Diastolic BP 66 80 72 67  60 63  Wt. (Lbs) 178 176.8 177.04 178 177 177 173.4  BMI 29.62 kg/m2 29.42 kg/m2 29.46 kg/m2 29.62 kg/m2 29.45 kg/m2 29.45 kg/m2 28.86 kg/m2      Latest Ref Rng & Units 10/11/2022    2:00 PM 04/08/2022   12:00 AM  Foot/eye exam completion dates  Eye Exam No Retinopathy  Retinopathy      Foot Form Completion  Done      This result is from an external source.      Uncontrolled, managed by Endo, contributing to fatigue

## 2022-10-26 NOTE — Assessment & Plan Note (Signed)
Has benefited from therapy and requests same

## 2022-10-26 NOTE — Assessment & Plan Note (Signed)
Controlled, no change in medication DASH diet and commitment to daily physical activity for a minimum of 30 minutes discussed and encouraged, as a part of hypertension management. The importance of attaining a healthy weight is also discussed.     10/26/2022    2:11 PM 10/11/2022    2:16 PM 10/05/2022    3:57 PM 09/16/2022    9:41 AM 09/13/2022    2:34 PM 07/14/2022   11:25 AM 06/22/2022    2:35 PM  BP/Weight  Systolic BP 119 120 123 130  112 104  Diastolic BP 66 80 72 67  60 63  Wt. (Lbs) 178 176.8 177.04 178 177 177 173.4  BMI 29.62 kg/m2 29.42 kg/m2 29.46 kg/m2 29.62 kg/m2 29.45 kg/m2 29.45 kg/m2 28.86 kg/m2

## 2022-10-26 NOTE — Progress Notes (Signed)
Jennifer Blackburn     MRN: [id number]      DOB: 1945-04-26  Chief Complaint  Patient presents with   Follow-up    Follow up, fatigue    HPI Jennifer Blackburn is here for follow up and re-evaluation of chronic medical conditions, medication management and review of any available recent lab and radiology data.  Preventive health is updated, specifically  Cancer screening and Immunization.   Questions or concerns regarding consultations or procedures which the PT has had in the interim are  addressed. The PT denies any adverse reactions to current medications since the last visit.  UTI symptoms have resolved Still c/o fatigue, however blood sugar is uncontrolled and she remains mentally distressed as spouse repeatedly says negative things to her, denied depression, has benefited from therapy in the past and requests same ROS Denies recent fever or chills. Denies sinus pressure, nasal congestion, ear pain or sore throat. Denies chest congestion, productive cough or wheezing. Denies chest pains, palpitations and leg swelling Denies abdominal pain, nausea, vomiting,diarrhea or constipation.   Denies dysuria, frequency, hesitancy or incontinence. Denies joint pain, swelling and limitation in mobility. Denies headaches, seizures, numbness, or tingling. Denies skin break down or rash.   PE  BP 119/66 (BP Location: Right Arm, Patient Position: Sitting, Cuff Size: Large)   Pulse 79   Ht 5\' 5"  (1.651 m)   Wt 178 lb (80.7 kg)   LMP 11/11/2016   SpO2 96%   BMI 29.62 kg/m   Patient alert and oriented and in no cardiopulmonary distress.  HEENT: No facial asymmetry, EOMI,     Neck supple .  Chest: Clear to auscultation bilaterally.  CVS: S1, S2 no murmurs, no S3.Regular rate.  ABD: Soft non tender.   Ext: No edema  MS: Adequate ROM spine, shoulders, hips and knees.  Skin: Intact, no ulcerations or rash noted.  Psych: Good eye contact, normal affect. Memory intact not anxious or  depressed appearing.  CNS: CN 2-12 intact, power,  normal throughout.no focal deficits noted.   Assessment & Plan  Type 2 diabetes mellitus with vascular disease (HCC) Jennifer Blackburn is reminded of the importance of commitment to daily physical activity for 30 minutes or more, as able and the need to limit carbohydrate intake to 30 to 60 grams per meal to help with blood sugar control.   The need to take medication as prescribed, test blood sugar as directed, and to call between visits if there is a concern that blood sugar is uncontrolled is also discussed.   Jennifer Blackburn is reminded of the importance of daily foot exam, annual eye examination, and good blood sugar, blood pressure and cholesterol control.     Latest Ref Rng & Units 09/09/2022   11:29 AM 06/11/2022   11:20 AM 06/08/2022    2:12 PM 06/02/2022   12:05 PM 05/28/2022    6:02 PM  Diabetic Labs  HbA1c 4.0 - 5.6 %   9.4     Micro/Creat Ratio 0 - 29 mg/g creat  11      Chol 100 - 199 mg/dL 841       HDL >32 mg/dL 45       Calc LDL 0 - 99 mg/dL 440       Triglycerides 0 - 149 mg/dL 102       Creatinine 7.25 - 1.00 mg/dL 3.66    4.40  3.47       10/26/2022    2:11 PM 10/11/2022  2:16 PM 10/05/2022    3:57 PM 09/16/2022    9:41 AM 09/13/2022    2:34 PM 07/14/2022   11:25 AM 06/22/2022    2:35 PM  BP/Weight  Systolic BP 119 120 123 130  112 104  Diastolic BP 66 80 72 67  60 63  Wt. (Lbs) 178 176.8 177.04 178 177 177 173.4  BMI 29.62 kg/m2 29.42 kg/m2 29.46 kg/m2 29.62 kg/m2 29.45 kg/m2 29.45 kg/m2 28.86 kg/m2      Latest Ref Rng & Units 10/11/2022    2:00 PM 04/08/2022   12:00 AM  Foot/eye exam completion dates  Eye Exam No Retinopathy  Retinopathy      Foot Form Completion  Done      This result is from an external source.      Uncontrolled, managed by Endo, contributing to fatigue  Primary hypertension Controlled, no change in medication DASH diet and commitment to daily physical activity for a minimum of 30 minutes  discussed and encouraged, as a part of hypertension management. The importance of attaining a healthy weight is also discussed.     10/26/2022    2:11 PM 10/11/2022    2:16 PM 10/05/2022    3:57 PM 09/16/2022    9:41 AM 09/13/2022    2:34 PM 07/14/2022   11:25 AM 06/22/2022    2:35 PM  BP/Weight  Systolic BP 119 120 123 130  112 104  Diastolic BP 66 80 72 67  60 63  Wt. (Lbs) 178 176.8 177.04 178 177 177 173.4  BMI 29.62 kg/m2 29.42 kg/m2 29.46 kg/m2 29.62 kg/m2 29.45 kg/m2 29.45 kg/m2 28.86 kg/m2       Overweight (BMI 25.0-29.9)  Patient re-educated about  the importance of commitment to a  minimum of 150 minutes of exercise per week as able.  The importance of healthy food choices with portion control discussed, as well as eating regularly and within a 12 hour window most days. The need to choose "clean , green" food 50 to 75% of the time is discussed, as well as to make water the primary drink and set a goal of 64 ounces water daily.       10/26/2022    2:11 PM 10/11/2022    2:16 PM 10/05/2022    3:57 PM  Weight /BMI  Weight 178 lb 176 lb 12.8 oz 177 lb 0.6 oz  Height 5\' 5"  (1.651 m) 5\' 5"  (1.651 m) 5\' 5"  (1.651 m)  BMI 29.62 kg/m2 29.42 kg/m2 29.46 kg/m2    Unchanged  Postsurgical hypothyroidism Managed by Endo and controlled on current med  Hyperlipidemia Hyperlipidemia:Low fat diet discussed and encouraged.   Lipid Panel  Lab Results  Component Value Date   CHOL 202 (H) 09/09/2022   HDL 45 09/09/2022   LDLCALC 124 (H) 09/09/2022   TRIG 187 (H) 09/09/2022   CHOLHDL 4.5 (H) 09/09/2022     Not at goal, reduce fried and fatty foods and add ezetemide 10 mg daily, I have prescribed this and will need to let her know, continue pravstatin as before Rept fasting lipid in 4 months just before f/u  Stress at home Has benefited from therapy and requests same

## 2022-10-26 NOTE — Assessment & Plan Note (Signed)
Managed by Endo and controlled on current med

## 2022-10-26 NOTE — Assessment & Plan Note (Addendum)
Hyperlipidemia:Low fat diet discussed and encouraged.   Lipid Panel  Lab Results  Component Value Date   CHOL 202 (H) 09/09/2022   HDL 45 09/09/2022   LDLCALC 124 (H) 09/09/2022   TRIG 187 (H) 09/09/2022   CHOLHDL 4.5 (H) 09/09/2022     Not at goal, reduce fried and fatty foods and add ezetemide 10 mg daily, I have prescribed this and will need to let her know, continue pravstatin as before Rept fasting lipid in 4 months just before f/u

## 2022-10-26 NOTE — Assessment & Plan Note (Signed)
  Patient re-educated about  the importance of commitment to a  minimum of 150 minutes of exercise per week as able.  The importance of healthy food choices with portion control discussed, as well as eating regularly and within a 12 hour window most days. The need to choose "clean , green" food 50 to 75% of the time is discussed, as well as to make water the primary drink and set a goal of 64 ounces water daily.       10/26/2022    2:11 PM 10/11/2022    2:16 PM 10/05/2022    3:57 PM  Weight /BMI  Weight 178 lb 176 lb 12.8 oz 177 lb 0.6 oz  Height 5\' 5"  (1.651 m) 5\' 5"  (1.651 m) 5\' 5"  (1.651 m)  BMI 29.62 kg/m2 29.42 kg/m2 29.46 kg/m2    Unchanged

## 2022-10-26 NOTE — Patient Instructions (Addendum)
F/U in 8 to 10 weeks, call if you need me sooner, work on dealing with stress differently  You are referred to diabetic education, and you will get info on monthly free classes  Please start daily exercise for 5 days per week  Vaccines needed now shingles, TdaP  You are referred to therapy  Thanks for choosing [facility], we consider it a privelige to serve you.  ]

## 2022-11-12 ENCOUNTER — Ambulatory Visit (INDEPENDENT_AMBULATORY_CARE_PROVIDER_SITE_OTHER): Payer: Medicare HMO | Admitting: Family Medicine

## 2022-11-12 ENCOUNTER — Encounter: Payer: Self-pay | Admitting: Family Medicine

## 2022-11-12 ENCOUNTER — Other Ambulatory Visit: Payer: Self-pay | Admitting: Family Medicine

## 2022-11-12 VITALS — BP 119/72 | HR 64 | Ht 65.0 in | Wt 181.0 lb

## 2022-11-12 DIAGNOSIS — J019 Acute sinusitis, unspecified: Secondary | ICD-10-CM | POA: Diagnosis not present

## 2022-11-12 DIAGNOSIS — J029 Acute pharyngitis, unspecified: Secondary | ICD-10-CM | POA: Diagnosis not present

## 2022-11-12 DIAGNOSIS — B9789 Other viral agents as the cause of diseases classified elsewhere: Secondary | ICD-10-CM | POA: Insufficient documentation

## 2022-11-12 MED ORDER — AZELASTINE-FLUTICASONE 137-50 MCG/ACT NA SUSP: 1.0000 | Freq: Two times a day (BID) | NASAL | 1 refills | Status: DC

## 2022-11-12 MED ORDER — PREDNISONE 20 MG PO TABS
20.0000 mg | ORAL_TABLET | Freq: Two times a day (BID) | ORAL | 0 refills | Status: AC
Start: 2022-11-12 — End: 2022-11-17

## 2022-11-12 MED ORDER — BENZONATATE 100 MG PO CAPS
100.0000 mg | ORAL_CAPSULE | Freq: Two times a day (BID) | ORAL | 0 refills | Status: DC | PRN
Start: 2022-11-12 — End: 2023-03-03

## 2022-11-12 NOTE — Patient Instructions (Signed)

## 2022-11-12 NOTE — Progress Notes (Signed)
Patient Office Visit   Subjective   Patient ID: Jennifer Blackburn, female    DOB: Mar 18, 1946  Age: 77 y.o. MRN: [id number]  CC:  Chief Complaint  Patient presents with   Headache    Headache    Nasal Congestion    Nasal congestion w/ cold symptoms, sore throat, only above the throat. Otc not helping.    Immunizations    Wants to be scheduled for Flu vaccine as well as Tdap. Will call back to schedule for shingles. vaccination    HPI Jennifer Blackburn 77 year old female presents to the clinic for nasal congestion and sore throat started last Saturday. She  has a past medical history of Anxiety, Arthritis, CAD (coronary artery disease), Depression, Diabetes mellitus type II, DJD (degenerative joint disease) of lumbar spine, Hypercholesteremia, Hyperlipidemia, Hypertension, Hypothyroidism, and Thyroid cancer (HCC) (2001).  Patient complains cough. Patient describes symptoms of, facial pain cough, headache, malaise, and sore throat. Symptoms began 6 days ago and are unchanged since that time with symptoms lingering.. Patient denies chest pain or nausea and vomiting. Treatment thus far includes OTC analgesics/antipyretics: not very effective Past pulmonary history is significant for no history of pneumonia or bronchitis      Outpatient Encounter Medications as of 11/12/2022  Medication Sig   Azelastine-Fluticasone 137-50 MCG/ACT SUSP Place 1 spray into the nose every 12 (twelve) hours.   benzonatate (TESSALON) 100 MG capsule Take 1 capsule (100 mg total) by mouth 2 (two) times daily as needed for cough.   predniSONE (DELTASONE) 20 MG tablet Take 1 tablet (20 mg total) by mouth 2 (two) times daily with a meal for 5 days.   ACCU-CHEK GUIDE test strip USE AS INSTRUCTED TO CHECK BLOOD SUGAR 2 TIMES PER DAY DX CODE E11.65   Cetirizine HCl 10 MG TBDP Take one tablet by mouth two times daily for allergy (Patient taking differently: Take 1 tablet by mouth daily.)   Cholecalciferol (VITAMIN D3) 50  MCG (2000 UT) TABS Take 2,000 Units by mouth daily.   ezetimibe (ZETIA) 10 MG tablet Take 1 tablet (10 mg total) by mouth daily.   gabapentin (NEURONTIN) 100 MG capsule Take one capsule twice daily and three at bedtime   hydrOXYzine (ATARAX) 10 MG tablet Take one tablet twice daily and two tablets at bedtime as needed for generalized itching.   imipramine (TOFRANIL) 50 MG tablet TAKE 1 TABLET AT BEDTIME   insulin aspart (NOVOLOG FLEXPEN) 100 UNIT/ML FlexPen Inject 10-15 Units into the skin 3 (three) times daily with meals. (Patient taking differently: Inject 15 Units into the skin 3 (three) times daily with meals. Inject 20-25 units wit meals)   insulin glargine, 1 Unit Dial, (TOUJEO SOLOSTAR) 300 UNIT/ML Solostar Pen INJECT 30-50 UNITS TWO TIMES DAILY   Insulin Pen Needle 32G X 4 MM MISC Use 4x a day   isosorbide mononitrate (IMDUR) 30 MG 24 hr tablet TAKE HALF A TABLET BY MOUTH DAILY   Lancets (ACCU-CHEK MULTICLIX) lancets Use as instructed three times daily dx 250.01   levothyroxine (SYNTHROID) 125 MCG tablet TAKE 1 TABLET BY MOUTH EVERY DAY BEFORE BREAKFAST   metFORMIN (GLUCOPHAGE) 1000 MG tablet TAKE 1 TABLET TWICE DAILY   metoprolol tartrate (LOPRESSOR) 50 MG tablet TAKE 1 TABLET BY MOUTH TWICE A DAY (Patient taking differently: Take 25 mg by mouth in the morning and at bedtime.)   ondansetron (ZOFRAN-ODT) 8 MG disintegrating tablet 8mg  ODT q4 hours prn nausea   pantoprazole (PROTONIX) 40 MG tablet  TAKE 1 TABLET BY MOUTH EVERY DAY   PARoxetine (PAXIL) 20 MG tablet TAKE 1 TABLET EVERY DAY   pravastatin (PRAVACHOL) 80 MG tablet TAKE 1 TABLET EVERY DAY   Semaglutide, 1 MG/DOSE, 4 MG/3ML SOPN Inject 1 mg as directed once a week.   No facility-administered encounter medications on file as of 11/12/2022.    Past Surgical History:  Procedure Laterality Date   ABDOMINAL HYSTERECTOMY     CARDIAC CATHETERIZATION     CATARACT EXTRACTION W/PHACO Right 07/17/2012   Procedure: CATARACT EXTRACTION  PHACO AND INTRAOCULAR LENS PLACEMENT (IOC);  Surgeon: Gemma Payor, MD;  Location: [facility];  Service: Ophthalmology;  Laterality: Right;  CDE:25.51   CATARACT EXTRACTION W/PHACO Left 09/17/2016   Procedure: CATARACT EXTRACTION PHACO AND INTRAOCULAR LENS PLACEMENT LEFT EYE;  Surgeon: Gemma Payor, MD;  Location: [facility];  Service: Ophthalmology;  Laterality: Left;  CDE: 19.23   COLONOSCOPY     COLONOSCOPY N/A 01/15/2014   Procedure: COLONOSCOPY;  Surgeon: Corbin Ade, MD;  Location: [facility];  Service: Endoscopy;  Laterality: N/A;  9:00 AM   COLONOSCOPY WITH PROPOFOL N/A 01/25/2019   Procedure: COLONOSCOPY WITH PROPOFOL;  Surgeon: Corbin Ade, MD; diverticulosis in the entire colon, otherwise normal exam.  No repeat colonoscopy due to age.   DOPPLER ECHOCARDIOGRAPHY     ESOPHAGOGASTRODUODENOSCOPY (EGD) WITH PROPOFOL N/A 01/25/2019   Procedure: ESOPHAGOGASTRODUODENOSCOPY (EGD) WITH PROPOFOL;  Surgeon: Corbin Ade, MD; normal esophagus without dilation due to inability to pass dilator beyond the hypopharynx, 1 small nonbleeding gastric ulcer s/p biopsied, otherwise normal exam.  Pathology with ulcer with reactive changes, no H. pylori, metaplasia, dysplasia, or malignancy.   ESOPHAGOGASTRODUODENOSCOPY (EGD) WITH PROPOFOL N/A 08/21/2019   Procedure: ESOPHAGOGASTRODUODENOSCOPY (EGD) WITH PROPOFOL;  Surgeon: Corbin Ade, MD; Normal esophagus. Previously noted gastric ulcer completely healed. Normal examined duodenum.    JOINT REPLACEMENT  07/01/2010   left hip   KNEE SURGERY Right    arthroscopy   left hip replaced  07/01/2010   SPINE SURGERY  2006   cervical   stress dipyridamole myocardial perfusion     THYROIDECTOMY     TONSILLECTOMY     VESICOVAGINAL FISTULA CLOSURE W/ TAH      Review of Systems  Constitutional:  Positive for malaise/fatigue. Negative for chills and fever.  HENT:  Positive for ear pain, sinus pain and sore throat.   Eyes:  Negative for blurred vision.   Respiratory:  Negative for shortness of breath.   Cardiovascular:  Negative for chest pain.  Neurological:  Positive for headaches. Negative for dizziness.      Objective    BP 119/72 (BP Location: Left Arm, Patient Position: Sitting, Cuff Size: Normal)   Pulse 64   Ht 5\' 5"  (1.651 m)   Wt 181 lb (82.1 kg)   LMP 11/11/2016   SpO2 98%   BMI 30.12 kg/m   Physical Exam Vitals reviewed.  Constitutional:      General: She is not in acute distress.    Appearance: Normal appearance. She is not ill-appearing, toxic-appearing or diaphoretic.  HENT:     Head: Normocephalic.     Right Ear: Tympanic membrane normal.     Left Ear: Tympanic membrane normal.     Nose: Congestion and rhinorrhea present.     Mouth/Throat:     Pharynx: Posterior oropharyngeal erythema present.  Eyes:     General:        Right eye: No discharge.  Left eye: No discharge.     Conjunctiva/sclera: Conjunctivae normal.  Cardiovascular:     Rate and Rhythm: Normal rate.     Pulses: Normal pulses.     Heart sounds: Normal heart sounds.  Pulmonary:     Effort: Pulmonary effort is normal. No respiratory distress.     Breath sounds: Normal breath sounds.  Musculoskeletal:        General: Normal range of motion.     Cervical back: Normal range of motion.  Skin:    General: Skin is warm and dry.  Neurological:     General: No focal deficit present.     Mental Status: She is alert.     Coordination: Coordination normal.     Gait: Gait normal.  Psychiatric:        Mood and Affect: Mood normal.        Behavior: Behavior normal.       Assessment & Plan:  Viral sinusitis -     Azelastine-Fluticasone; Place 1 spray into the nose every 12 (twelve) hours.  Dispense: 23 g; Refill: 1 -     Benzonatate; Take 1 capsule (100 mg total) by mouth 2 (two) times daily as needed for cough.  Dispense: 20 capsule; Refill: 0 -     predniSONE; Take 1 tablet (20 mg total) by mouth 2 (two) times daily with a meal for 5  days.  Dispense: 10 tablet; Refill: 0  Sore throat -     POCT rapid strep A  Acute viral sinusitis Assessment & Plan: Ordered Rapid Strep A to rule out strep throat Prednisone 20 mg twice day x 5 days Benzonatate 100 mg PRN for cough and Azelastine- Fluticasone nasal spray Advise Symptomatic treatment, rest, increase oral fluid intake. Take OTC tylenol for fever or pain. Follow-up for worsening or persistent symptoms. Patient verbalizes understanding regarding plan of care and all questions answered      Return if symptoms worsen or fail to improve.   Cruzita Lederer Newman Nip, FNP

## 2022-11-12 NOTE — Assessment & Plan Note (Signed)
Ordered Rapid Strep A to rule out strep throat Prednisone 20 mg twice day x 5 days Benzonatate 100 mg PRN for cough and Azelastine- Fluticasone nasal spray Advise Symptomatic treatment, rest, increase oral fluid intake. Take OTC tylenol for fever or pain. Follow-up for worsening or persistent symptoms. Patient verbalizes understanding regarding plan of care and all questions answered

## 2022-11-28 ENCOUNTER — Other Ambulatory Visit: Payer: Self-pay | Admitting: Cardiovascular Disease

## 2022-12-07 ENCOUNTER — Encounter: Payer: Self-pay | Admitting: Family Medicine

## 2022-12-16 ENCOUNTER — Encounter: Payer: Self-pay | Admitting: Family Medicine

## 2022-12-16 ENCOUNTER — Ambulatory Visit: Payer: Medicare HMO | Admitting: Family Medicine

## 2022-12-16 VITALS — BP 119/81 | HR 75 | Temp 98.3°F | Resp 16 | Ht 65.0 in | Wt 181.0 lb

## 2022-12-16 DIAGNOSIS — J329 Chronic sinusitis, unspecified: Secondary | ICD-10-CM | POA: Insufficient documentation

## 2022-12-16 DIAGNOSIS — J321 Chronic frontal sinusitis: Secondary | ICD-10-CM

## 2022-12-16 MED ORDER — AZITHROMYCIN 250 MG PO TABS: ORAL_TABLET | ORAL | 0 refills | Status: DC

## 2022-12-16 NOTE — Progress Notes (Signed)
Patient Office Visit   Subjective   Patient ID: Jennifer Blackburn, female    DOB: [birth year]  Age: 77 y.o. MRN: [id number]  CC:  Chief Complaint  Patient presents with   Generalized Body Aches    Started subday, sore throat, lightheaded/dizzy, body aches, l ear pain and headache. Been in bed since Sunday    HPI Jennifer Blackburn 77 year old female, presents to the clinic for worsening cough and sore throat since our last visit.She  has a past medical history of Anxiety, Arthritis, CAD (coronary artery disease), Depression, Diabetes mellitus type II, DJD (degenerative joint disease) of lumbar spine, Hypercholesteremia, Hyperlipidemia, Hypertension, Hypothyroidism, and Thyroid cancer (HCC) (2001).  Patient complains of dry cough. Patient describes symptoms of fatigue, sore throat, left ear pain, headache, malaise, myalgias. Symptoms began a few weeks ago and are gradually worsening since that time. Patient denies dyspnea, chest pain, or nausea and vomiting. Treatment thus far includes OTC analgesics/antipyretics: not very effective Past pulmonary history is significant for no history of pneumonia or bronchitis       Outpatient Encounter Medications as of 12/16/2022  Medication Sig   ACCU-CHEK GUIDE test strip USE AS INSTRUCTED TO CHECK BLOOD SUGAR 2 TIMES PER DAY DX CODE E11.65   azithromycin (ZITHROMAX) 250 MG tablet Take 2 tablets on day 1, then 1 tablet daily on days 2 through 5   Cetirizine HCl 10 MG TBDP Take one tablet by mouth two times daily for allergy (Patient taking differently: Take 1 tablet by mouth daily.)   Cholecalciferol (VITAMIN D3) 50 MCG (2000 UT) TABS Take 2,000 Units by mouth daily.   ezetimibe (ZETIA) 10 MG tablet Take 1 tablet (10 mg total) by mouth daily.   gabapentin (NEURONTIN) 100 MG capsule Take one capsule twice daily and three at bedtime   hydrOXYzine (ATARAX) 10 MG tablet Take one tablet twice daily and two tablets at bedtime as needed for generalized  itching.   imipramine (TOFRANIL) 50 MG tablet TAKE 1 TABLET AT BEDTIME   insulin glargine, 1 Unit Dial, (TOUJEO SOLOSTAR) 300 UNIT/ML Solostar Pen INJECT 30-50 UNITS TWO TIMES DAILY   Insulin Pen Needle 32G X 4 MM MISC Use 4x a day   isosorbide mononitrate (IMDUR) 30 MG 24 hr tablet TAKE HALF A TABLET BY MOUTH DAILY   Lancets (ACCU-CHEK MULTICLIX) lancets Use as instructed three times daily dx 250.01   levothyroxine (SYNTHROID) 125 MCG tablet TAKE 1 TABLET BY MOUTH EVERY DAY BEFORE BREAKFAST   metFORMIN (GLUCOPHAGE) 1000 MG tablet TAKE 1 TABLET TWICE DAILY   metoprolol tartrate (LOPRESSOR) 50 MG tablet TAKE 1 TABLET BY MOUTH TWICE A DAY   ondansetron (ZOFRAN-ODT) 8 MG disintegrating tablet 8mg  ODT q4 hours prn nausea   pantoprazole (PROTONIX) 40 MG tablet TAKE 1 TABLET BY MOUTH EVERY DAY   PARoxetine (PAXIL) 20 MG tablet TAKE 1 TABLET EVERY DAY   pravastatin (PRAVACHOL) 80 MG tablet TAKE 1 TABLET EVERY DAY   Semaglutide, 1 MG/DOSE, 4 MG/3ML SOPN Inject 1 mg as directed once a week.   benzonatate (TESSALON) 100 MG capsule Take 1 capsule (100 mg total) by mouth 2 (two) times daily as needed for cough. (Patient not taking: Reported on 12/16/2022)   fluticasone (FLONASE) 50 MCG/ACT nasal spray Place 2 sprays into both nostrils daily.   insulin aspart (NOVOLOG FLEXPEN) 100 UNIT/ML FlexPen Inject 10-15 Units into the skin 3 (three) times daily with meals. (Patient taking differently: Inject 15 Units into the skin 3 (three) times  daily with meals. Inject 20-25 units wit meals)   No facility-administered encounter medications on file as of 12/16/2022.    Past Surgical History:  Procedure Laterality Date   ABDOMINAL HYSTERECTOMY     CARDIAC CATHETERIZATION     CATARACT EXTRACTION W/PHACO Right 07/17/2012   Procedure: CATARACT EXTRACTION PHACO AND INTRAOCULAR LENS PLACEMENT (IOC);  Surgeon: Gemma Payor, MD;  Location: [facility];  Service: Ophthalmology;  Laterality: Right;  CDE:25.51   CATARACT EXTRACTION  W/PHACO Left 09/17/2016   Procedure: CATARACT EXTRACTION PHACO AND INTRAOCULAR LENS PLACEMENT LEFT EYE;  Surgeon: Gemma Payor, MD;  Location: [facility];  Service: Ophthalmology;  Laterality: Left;  CDE: 19.23   COLONOSCOPY     COLONOSCOPY N/A 01/15/2014   Procedure: COLONOSCOPY;  Surgeon: Corbin Ade, MD;  Location: [facility];  Service: Endoscopy;  Laterality: N/A;  9:00 AM   COLONOSCOPY WITH PROPOFOL N/A 01/25/2019   Procedure: COLONOSCOPY WITH PROPOFOL;  Surgeon: Corbin Ade, MD; diverticulosis in the entire colon, otherwise normal exam.  No repeat colonoscopy due to age.   DOPPLER ECHOCARDIOGRAPHY     ESOPHAGOGASTRODUODENOSCOPY (EGD) WITH PROPOFOL N/A 01/25/2019   Procedure: ESOPHAGOGASTRODUODENOSCOPY (EGD) WITH PROPOFOL;  Surgeon: Corbin Ade, MD; normal esophagus without dilation due to inability to pass dilator beyond the hypopharynx, 1 small nonbleeding gastric ulcer s/p biopsied, otherwise normal exam.  Pathology with ulcer with reactive changes, no H. pylori, metaplasia, dysplasia, or malignancy.   ESOPHAGOGASTRODUODENOSCOPY (EGD) WITH PROPOFOL N/A 08/21/2019   Procedure: ESOPHAGOGASTRODUODENOSCOPY (EGD) WITH PROPOFOL;  Surgeon: Corbin Ade, MD; Normal esophagus. Previously noted gastric ulcer completely healed. Normal examined duodenum.    JOINT REPLACEMENT  07/01/2010   left hip   KNEE SURGERY Right    arthroscopy   left hip replaced  07/01/2010   SPINE SURGERY  2006   cervical   stress dipyridamole myocardial perfusion     THYROIDECTOMY     TONSILLECTOMY     VESICOVAGINAL FISTULA CLOSURE W/ TAH      Review of Systems  Constitutional:  Positive for fever and malaise/fatigue. Negative for chills.  HENT:  Positive for congestion, ear pain and sore throat.   Eyes:  Negative for blurred vision.  Respiratory:  Positive for cough. Negative for sputum production and shortness of breath.   Cardiovascular:  Negative for chest pain.  Genitourinary:  Negative for dysuria.   Neurological:  Positive for headaches.      Objective    BP 119/81   Pulse 75   Temp 98.3 F (36.8 C) (Oral)   Resp 16   Ht 5\' 5"  (1.651 m)   Wt 181 lb (82.1 kg)   LMP 11/11/2016   SpO2 98%   BMI 30.12 kg/m   Physical Exam Vitals reviewed.  Constitutional:      General: She is not in acute distress.    Appearance: Normal appearance. She is not ill-appearing, toxic-appearing or diaphoretic.  HENT:     Head: Normocephalic.     Right Ear: Tympanic membrane normal.     Left Ear: Tympanic membrane normal.     Mouth/Throat:     Mouth: Mucous membranes are moist.  Eyes:     General:        Right eye: No discharge.        Left eye: No discharge.     Conjunctiva/sclera: Conjunctivae normal.     Pupils: Pupils are equal, round, and reactive to light.  Cardiovascular:     Rate and Rhythm: Normal rate.  Pulses: Normal pulses.     Heart sounds: Normal heart sounds.  Pulmonary:     Effort: Pulmonary effort is normal. No respiratory distress.     Breath sounds: Normal breath sounds.  Musculoskeletal:        General: Normal range of motion.     Cervical back: Normal range of motion.  Lymphadenopathy:     Cervical: Cervical adenopathy present.  Skin:    General: Skin is warm and dry.     Capillary Refill: Capillary refill takes less than 2 seconds.  Neurological:     General: No focal deficit present.     Mental Status: She is alert.     Coordination: Coordination normal.     Gait: Gait normal.       Assessment & Plan:  Frontal sinusitis, unspecified chronicity Assessment & Plan: Azithromycin 250 mg x 5 days Advise Symptomatic treatment, rest, increase oral fluid intake. Take OTC tylenol for fever or pain medications. Follow-up for worsening or persistent symptoms. Patient verbalizes understanding regarding plan of care and all questions answered   Orders: -     Azithromycin; Take 2 tablets on day 1, then 1 tablet daily on days 2 through 5  Dispense: 6 tablet;  Refill: 0    Return if symptoms worsen or fail to improve.   Cruzita Lederer Newman Nip, FNP

## 2022-12-16 NOTE — Assessment & Plan Note (Signed)
Azithromycin 250 mg x 5 days Advise Symptomatic treatment, rest, increase oral fluid intake. Take OTC tylenol for fever or pain medications. Follow-up for worsening or persistent symptoms. Patient verbalizes understanding regarding plan of care and all questions answered

## 2022-12-16 NOTE — Patient Instructions (Addendum)
        Great to see you today.  I have refilled the medication(s) we provide.    - Please take medications as prescribed. - Follow up with your primary health provider if any health concerns arises. - If symptoms worsen please contact your primary care provider and/or visit the emergency department.  

## 2022-12-20 ENCOUNTER — Telehealth (HOSPITAL_COMMUNITY): Payer: Self-pay

## 2022-12-20 NOTE — Telephone Encounter (Signed)
12/22/22 appt confirmed

## 2022-12-22 ENCOUNTER — Ambulatory Visit (INDEPENDENT_AMBULATORY_CARE_PROVIDER_SITE_OTHER): Payer: Medicare HMO | Admitting: Psychiatry

## 2022-12-22 DIAGNOSIS — Z630 Problems in relationship with spouse or partner: Secondary | ICD-10-CM

## 2022-12-24 ENCOUNTER — Encounter (HOSPITAL_COMMUNITY): Payer: Self-pay | Admitting: Psychiatry
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
## 2022-12-27 ENCOUNTER — Encounter: Payer: Medicare HMO | Attending: Family Medicine | Admitting: Nutrition

## 2022-12-27 ENCOUNTER — Encounter: Payer: Self-pay | Admitting: Nutrition

## 2022-12-27 DIAGNOSIS — I10 Essential (primary) hypertension: Secondary | ICD-10-CM

## 2022-12-27 DIAGNOSIS — I2510 Atherosclerotic heart disease of native coronary artery without angina pectoris: Secondary | ICD-10-CM

## 2022-12-27 DIAGNOSIS — E1159 Type 2 diabetes mellitus with other circulatory complications: Secondary | ICD-10-CM

## 2022-12-27 DIAGNOSIS — E782 Mixed hyperlipidemia: Secondary | ICD-10-CM

## 2022-12-27 DIAGNOSIS — E119 Type 2 diabetes mellitus without complications: Secondary | ICD-10-CM | POA: Insufficient documentation

## 2022-12-27 DIAGNOSIS — Z713 Dietary counseling and surveillance: Secondary | ICD-10-CM | POA: Insufficient documentation

## 2022-12-27 NOTE — Patient Instructions (Addendum)
Goals Increase whole plant based foods with meals Eat breakfast at 9 am, lunch 12-2 and dinner 5-7 pm. Drink only water Use Dexcom to assess BS control Take medications as prescribed. Get A1C down to 7% Call MD if BS drop in the 70's/low 80's for medication adjustments.

## 2022-12-27 NOTE — Progress Notes (Signed)
Medical Nutrition Therapy  Appointment Start time:  1300  Appointment End time:  1430  Primary concerns today: DM Typ3 2  Referral diagnosis: E11.8 Preferred learning style: No Preference  Learning readiness: Ready   NUTRITION ASSESSMENT   77 yr old bfemale referred for uncontrolled Type 2 DM. 10.1% in July 2024. Ozempic 1.0 weekly; Has lost about 30 lbs.  Touje 30 units BID. Novolog 22 units before each meal. Metformin PCP DR. Simspon, Endocrinologist Dr. Elvera Lennox  Finished prednisone and on antibiotics due to URI recently.  She was willing to have a dexcom sensor sample placed on today to help her evaluate her BS control for better management. She is on MDI and A1C is still uncontrolled. BS was 192 mg/dl after warm up.  Training done on Dexcom and it's use. Connected to clinic.  She is wiling to work on [employer] [facility] to focus on eating more whole plant based foods, getting exercise and taking care of the 6 pillars of health. She is seeing a therapist. Working on taking better care of self.    Clinical Medical Hx:  Past Medical History:  Diagnosis Date   Anxiety    Arthritis    CAD (coronary artery disease)    Depression    Diabetes mellitus type II    without complication   DJD (degenerative joint disease) of lumbar spine    Hypercholesteremia    Hyperlipidemia    Hypertension    benign    Hypothyroidism    Thyroid cancer (HCC) 2001    Medications:  Current Outpatient Medications on File Prior to Visit  Medication Sig Dispense Refill   ACCU-CHEK GUIDE test strip USE AS INSTRUCTED TO CHECK BLOOD SUGAR 2 TIMES PER DAY DX CODE E11.65 100 strip 3   azithromycin (ZITHROMAX) 250 MG tablet Take 2 tablets on day 1, then 1 tablet daily on days 2 through 5 6 tablet 0   benzonatate (TESSALON) 100 MG capsule Take 1 capsule (100 mg total) by mouth 2 (two) times daily as needed for cough. 20 capsule 0   Cetirizine HCl 10 MG TBDP Take one tablet by mouth two times daily for  allergy (Patient taking differently: Take 1 tablet by mouth daily.) 60 tablet 5   Cholecalciferol (VITAMIN D3) 50 MCG (2000 UT) TABS Take 2,000 Units by mouth daily.     ezetimibe (ZETIA) 10 MG tablet Take 1 tablet (10 mg total) by mouth daily. 30 tablet 5   fluticasone (FLONASE) 50 MCG/ACT nasal spray Place 2 sprays into both nostrils daily. 10 mL 4   gabapentin (NEURONTIN) 100 MG capsule Take one capsule twice daily and three at bedtime 150 capsule 1   hydrOXYzine (ATARAX) 10 MG tablet Take one tablet twice daily and two tablets at bedtime as needed for generalized itching. 360 tablet 1   imipramine (TOFRANIL) 50 MG tablet TAKE 1 TABLET AT BEDTIME 90 tablet 2   insulin aspart (NOVOLOG FLEXPEN) 100 UNIT/ML FlexPen Inject 10-15 Units into the skin 3 (three) times daily with meals. (Patient taking differently: Inject 15 Units into the skin 3 (three) times daily with meals. Inject 20-25 units wit meals) 15 mL 2   insulin glargine, 1 Unit Dial, (TOUJEO SOLOSTAR) 300 UNIT/ML Solostar Pen INJECT 30-50 UNITS TWO TIMES DAILY 9 mL 2   Insulin Pen Needle 32G X 4 MM MISC Use 4x a day 300 each 3   isosorbide mononitrate (IMDUR) 30 MG 24 hr tablet TAKE HALF A TABLET BY MOUTH DAILY 45 tablet 3  Lancets (ACCU-CHEK MULTICLIX) lancets Use as instructed three times daily dx 250.01 100 each 5   levothyroxine (SYNTHROID) 125 MCG tablet TAKE 1 TABLET BY MOUTH EVERY DAY BEFORE BREAKFAST 90 tablet 3   metFORMIN (GLUCOPHAGE) 1000 MG tablet TAKE 1 TABLET TWICE DAILY 180 tablet 1   metoprolol tartrate (LOPRESSOR) 50 MG tablet TAKE 1 TABLET BY MOUTH TWICE A DAY 180 tablet 2   ondansetron (ZOFRAN-ODT) 8 MG disintegrating tablet 8mg  ODT q4 hours prn nausea (Patient not taking: Reported on 12/22/2022) 10 tablet 0   pantoprazole (PROTONIX) 40 MG tablet TAKE 1 TABLET BY MOUTH EVERY DAY 90 tablet 1   PARoxetine (PAXIL) 20 MG tablet TAKE 1 TABLET EVERY DAY 90 tablet 3   pravastatin (PRAVACHOL) 80 MG tablet TAKE 1 TABLET EVERY DAY  90 tablet 3   Semaglutide, 1 MG/DOSE, 4 MG/3ML SOPN Inject 1 mg as directed once a week. 3 mL 1   No current facility-administered medications on file prior to visit.    Labs:  Lab Results  Component Value Date   HGBA1C 9.4 (A) 06/08/2022    Notable Signs/Symptoms: Increased fatigue, frequent urination, hunger, poor sleep  Lifestyle & Dietary Hx Lives with her husband. Retired.  Estimated daily fluid intake: 40 oz Supplements:  Sleep: Poor Stress / self-care: her health Current average weekly physical activity: ADL  24-Hr Dietary Recall Eats 3 meals per day. Tends to have a lot of processed food due to husband's preferences. Eats out some.  Estimated Energy Needs Calories: 1200 Carbohydrate: 135g Protein: 90g Fat: 33g   NUTRITION DIAGNOSIS  NB-1.1 Food and nutrition-related knowledge deficit As related to Diabetes Type 2 Uncontrolled.  As evidenced by A1C 10.1%.   NUTRITION INTERVENTION  Nutrition education (E-1) on the following topics:  Nutrition and Diabetes education provided on My Plate, CHO counting, meal planning, portion sizes, timing of meals, avoiding snacks between meals unless having a low blood sugar, target ranges for A1C and blood sugars, signs/symptoms and treatment of hyper/hypoglycemia, monitoring blood sugars, taking medications as prescribed, benefits of exercising 30 minutes per day and prevention of complications of DM.  Lifestyle Medicine  - Whole Food, Plant Predominant Nutrition is highly recommended: Eat Plenty of vegetables, Mushrooms, fruits, Legumes, Whole Grains, Nuts, seeds in lieu of processed meats, processed snacks/pastries red meat, poultry, eggs.    -It is better to avoid simple carbohydrates including: Cakes, Sweet Desserts, Ice Cream, Soda (diet and regular), Sweet Tea, Candies, Chips, Cookies, Store Bought Juices, Alcohol in Excess of  1-2 drinks a day, Lemonade,  Artificial Sweeteners, Doughnuts, Coffee Creamers, "Sugar-free"  Products, etc, etc.  This is not a complete list.....  Exercise: If you are able: 30 -60 minutes a day ,4 days a week, or 150 minutes a week.  The longer the better.  Combine stretch, strength, and aerobic activities.  If you were told in the past that you have high risk for cardiovascular diseases, you may seek evaluation by your heart doctor prior to initiating moderate to intense exercise programs.  Assessment:  Primary concerns today: Patient here for initiation of [employer] Continuous Glucose Monitoring. Dexcom G7  Medications: Toujeo and Novolog     Intervention:   Understanding Glucose Sensing Dexcom G7 Programming Sensor Information  High Glucose: 250 mg/dl  Low Glucose 70 mg/dl  Other settings to be added at follow up visit Starting [employer] of Product  Entering BG, Paramedic and Graphs with Public librarian and Alarms  Follow Up Patient to see MD for insulin dose adjustments and to schedule visit with me for CGM follow up within 2 months week(s).  Handouts Provided Include  Lifestyle Medicine handouts  Learning Style & Readiness for Change Teaching method utilized: Visual & Auditory  Demonstrated degree of understanding via: Teach Back  Barriers to learning/adherence to lifestyle change: Non3  Goals Established by Pt Goals Increase whole plant based foods with meals Eat breakfast at 9 am, lunch 12-2 and dinner 5-7 pm. Drink only water Use Dexcom to assess BS control Take medications as prescribed. Get A1C down to 7% Call MD if BS drop in the 70's/low 80's for medication adjustments.   MONITORING & EVALUATION Dietary intake, weekly physical activity, and BS  in 2 months.  Next Steps  Patient is to work on meal planning and focus on whole plant base foods.Marland Kitchen

## 2022-12-28 ENCOUNTER — Other Ambulatory Visit: Payer: Self-pay

## 2022-12-28 MED ORDER — DEXCOM G7 RECEIVER DEVI: 1 refills | Status: DC

## 2022-12-28 MED ORDER — DEXCOM G7 SENSOR MISC: 5 refills | Status: DC

## 2022-12-30 ENCOUNTER — Ambulatory Visit (INDEPENDENT_AMBULATORY_CARE_PROVIDER_SITE_OTHER): Payer: Medicare HMO | Admitting: Family Medicine

## 2022-12-30 ENCOUNTER — Encounter: Payer: Self-pay | Admitting: Family Medicine

## 2022-12-30 VITALS — BP 122/62 | HR 73 | Ht 65.0 in | Wt 177.0 lb

## 2022-12-30 DIAGNOSIS — I10 Essential (primary) hypertension: Secondary | ICD-10-CM | POA: Diagnosis not present

## 2022-12-30 DIAGNOSIS — Z0001 Encounter for general adult medical examination with abnormal findings: Secondary | ICD-10-CM

## 2022-12-30 DIAGNOSIS — E782 Mixed hyperlipidemia: Secondary | ICD-10-CM

## 2022-12-30 DIAGNOSIS — Z23 Encounter for immunization: Secondary | ICD-10-CM | POA: Diagnosis not present

## 2022-12-30 DIAGNOSIS — Z1231 Encounter for screening mammogram for malignant neoplasm of breast: Secondary | ICD-10-CM | POA: Diagnosis not present

## 2022-12-30 DIAGNOSIS — Z0000 Encounter for general adult medical examination without abnormal findings: Secondary | ICD-10-CM | POA: Insufficient documentation

## 2022-12-30 NOTE — Patient Instructions (Addendum)
F/u in mid  Januaruy , call if you need me sooner  Flu vaccine today  Colonoscopy due 01/2024  Please get covid vaccine in next 1 to 2 weeks  Pls get TdAP and shingrix vaccines over the next 7 months  Great you have dexcom,  and are seeing the nutritionist  Nurse pls provide sheets of paper for logging blood sugar  Fasting lipid, cmp and eGFr week of 01/10/2023 please  It is important that you exercise regularly at least 30 minutes 5 times a week. If you develop chest pain, have severe difficulty breathing, or feel very tired, stop exercising immediately and seek medical attention   Thanks for choosing [facility], we consider it a privelige to serve you.

## 2022-12-30 NOTE — Assessment & Plan Note (Signed)

## 2023-01-04 DIAGNOSIS — Z23 Encounter for immunization: Secondary | ICD-10-CM | POA: Insufficient documentation

## 2023-01-04 NOTE — Assessment & Plan Note (Signed)
After obtaining informed consent, the vaccine is  administered , with no adverse effect noted at the time of administration.  

## 2023-01-04 NOTE — Progress Notes (Signed)
Jennifer Blackburn     MRN: [id number]      DOB: 08/16/1945  Chief Complaint  Patient presents with   Annual Exam    HPI: Patient is in for annual physical exam. Has dexcom and is working with the Nutritionist to control her blood sugar and is already getting very comfortablewith using it as welll as monitoring food choice makes more sense to her Recent labs,  are reviewed. Immunization is reviewed , and  updated    PE: BP 122/62 (BP Location: Left Arm, Patient Position: Sitting, Cuff Size: Normal)   Pulse 73   Ht 5\' 5"  (1.651 m)   Wt 177 lb (80.3 kg)   LMP 11/11/2016   SpO2 98%   BMI 29.45 kg/m   Pleasant  female, alert and oriented x 3, in no cardio-pulmonary distress. Afebrile. HEENT No facial trauma or asymetry. Sinuses non tender.  Extra occullar muscles intact.. External ears normal, . Neck: supple, no adenopathy,JVD or thyromegaly.No bruits.  Chest: Clear to ascultation bilaterally.No crackles or wheezes. Non tender to palpation  Breast: Not examines, asymptomatic and mammogram to be scheduled , currently UTD  Cardiovascular system; Heart sounds normal,  S1 and  S2 ,no S3.  No murmur, or thrill. Apical beat not displaced Peripheral pulses normal.  Abdomen: Soft, non tender  Musculoskeletal exam: Decreased though adequate  ROM of spine, hips , shoulders and knees. No deformity ,swelling or crepitus noted. No muscle wasting or atrophy.   Neurologic: Cranial nerves 2 to 12 intact. Power, tone ,sensation  normal throughout. No disturbance in gait. No tremor.  Skin: Intact, no ulceration, erythema , scaling or rash noted. Pigmentation normal throughout  Psych; Normal mood and affect. Judgement and concentration normal   Assessment & Plan:  Encounter for annual physical exam Annual exam as documented. Counseling done  re healthy lifestyle involving commitment to 150 minutes exercise per week, heart healthy diet, and attaining healthy weight.The  importance of adequate sleep also discussed. Regular seat belt use and home safety, is also discussed. Changes in health habits are decided on by the patient with goals and time frames  set for achieving them. Immunization and cancer screening needs are specifically addressed at this visit.   Immunization due After obtaining informed consent, the vaccine is  administered , with no adverse effect noted at the time of administration.

## 2023-01-06 ENCOUNTER — Encounter: Payer: Self-pay | Admitting: Nutrition

## 2023-01-07 ENCOUNTER — Encounter: Payer: Self-pay | Admitting: Internal Medicine

## 2023-01-07 NOTE — Telephone Encounter (Signed)
Please Advise on refill request.

## 2023-01-13 ENCOUNTER — Encounter (HOSPITAL_COMMUNITY): Payer: Self-pay

## 2023-01-13 ENCOUNTER — Ambulatory Visit (HOSPITAL_COMMUNITY): Payer: Medicare HMO | Admitting: Psychiatry

## 2023-01-18 ENCOUNTER — Telehealth: Payer: Self-pay | Admitting: Family Medicine

## 2023-01-18 MED ORDER — SEMAGLUTIDE (2 MG/DOSE) 8 MG/3ML ~~LOC~~ SOPN: 2.0000 mg | PEN_INJECTOR | SUBCUTANEOUS | 2 refills | Status: AC

## 2023-01-18 NOTE — Telephone Encounter (Signed)
Patient called in requesting all back.  Is having swelling in ankles and feet, thinks is may be coming from new medication prescribed.  Wants a cll back in regard to discuss

## 2023-01-18 NOTE — Addendum Note (Signed)
Addended by: Pollie Meyer on: 01/18/2023 01:22 PM   Modules accepted: Orders

## 2023-01-19 NOTE — Telephone Encounter (Signed)
States has been having swelling in both feet up to her ankles and she thinks its from the zetia. They have usually went down a good amount by morning but today they started out swollen and look bigger. Please advise

## 2023-01-27 ENCOUNTER — Other Ambulatory Visit: Payer: Self-pay | Admitting: Internal Medicine

## 2023-01-27 DIAGNOSIS — E1159 Type 2 diabetes mellitus with other circulatory complications: Secondary | ICD-10-CM

## 2023-01-28 ENCOUNTER — Encounter: Payer: Self-pay | Admitting: Family Medicine

## 2023-01-31 ENCOUNTER — Other Ambulatory Visit: Payer: Self-pay

## 2023-01-31 DIAGNOSIS — Z79899 Other long term (current) drug therapy: Secondary | ICD-10-CM

## 2023-01-31 NOTE — Telephone Encounter (Signed)
Refer for pharmacy assistance

## 2023-02-01 ENCOUNTER — Telehealth: Payer: Self-pay

## 2023-02-01 NOTE — Progress Notes (Signed)
   Care Guide Note  02/01/2023 Name: Jennifer Blackburn MRN: [id number] DOB: 1945-12-25  Referred by: Kerri Perches, MD Reason for referral : Care Coordination (Outreach to schedule with Pharm d )   Jennifer Blackburn is a 77 y.o. year old female who is a primary care patient of Lodema Hong Milus Mallick, MD. [name] was referred to the pharmacist for assistance related to DM.    Successful contact was made with the patient to discuss pharmacy services including being ready for the pharmacist to call at least 5 minutes before the scheduled appointment time, to have medication bottles and any blood sugar or blood pressure readings ready for review. The patient agreed to meet with the pharmacist via with the pharmacist via telephone visit on (date/time).  02/16/2023  Penne Lash, RMA Care Guide [facility]  [place], [place] [address] Direct Dial: [phone] [email or web address]

## 2023-02-01 NOTE — Telephone Encounter (Signed)
Patient called in regard to my chart message.   Wants call back with any kind pa pharmacy assistance she can receive or apply for

## 2023-02-11 ENCOUNTER — Ambulatory Visit: Payer: Medicare HMO | Admitting: Internal Medicine

## 2023-02-16 ENCOUNTER — Other Ambulatory Visit: Payer: Self-pay | Admitting: Pharmacist

## 2023-02-21 ENCOUNTER — Ambulatory Visit (HOSPITAL_COMMUNITY)
Admission: RE | Admit: 2023-02-21 | Discharge: 2023-02-21 | Disposition: A | Discharge status: Home / Self Care | Payer: Medicare HMO | Source: Ambulatory Visit | Attending: Family Medicine | Admitting: Family Medicine

## 2023-02-21 DIAGNOSIS — E782 Mixed hyperlipidemia: Secondary | ICD-10-CM | POA: Diagnosis not present

## 2023-02-21 DIAGNOSIS — I10 Essential (primary) hypertension: Secondary | ICD-10-CM | POA: Diagnosis not present

## 2023-02-21 DIAGNOSIS — Z1231 Encounter for screening mammogram for malignant neoplasm of breast: Secondary | ICD-10-CM | POA: Diagnosis not present

## 2023-02-22 ENCOUNTER — Encounter: Payer: Self-pay | Admitting: Pharmacist

## 2023-02-22 ENCOUNTER — Ambulatory Visit: Payer: Medicare HMO | Admitting: Nutrition

## 2023-02-22 LAB — CMP14+EGFR
ALT: 14 [IU]/L (ref 0–32)
AST: 16 [IU]/L (ref 0–40)
Albumin: 4 g/dL (ref 3.8–4.8)
Alkaline Phosphatase: 53 [IU]/L (ref 44–121)
BUN/Creatinine Ratio: 18 (ref 12–28)
BUN: 16 mg/dL (ref 8–27)
Bilirubin Total: 0.2 mg/dL (ref 0.0–1.2)
CO2: 24 mmol/L (ref 20–29)
Calcium: 9.3 mg/dL (ref 8.7–10.3)
Chloride: 98 mmol/L (ref 96–106)
Creatinine, Ser: 0.9 mg/dL (ref 0.57–1.00)
Globulin, Total: 2.8 g/dL (ref 1.5–4.5)
Glucose: 219 mg/dL — ABNORMAL HIGH (ref 70–99)
Potassium: 4.8 mmol/L (ref 3.5–5.2)
Sodium: 137 mmol/L (ref 134–144)
Total Protein: 6.8 g/dL (ref 6.0–8.5)
eGFR: 65 mL/min/{1.73_m2} (ref >59)

## 2023-02-22 LAB — LIPID PANEL
Chol/HDL Ratio: 3 {ratio} (ref 0.0–4.4)
Cholesterol, Total: 136 mg/dL (ref 100–199)
HDL: 45 mg/dL (ref >39)
LDL Chol Calc (NIH): 67 mg/dL (ref 0–99)
Triglycerides: 135 mg/dL (ref 0–149)
VLDL Cholesterol Cal: 24 mg/dL (ref 5–40)

## 2023-02-22 NOTE — Progress Notes (Signed)
02/16/2023 Name: Jennifer Blackburn MRN: [id number] DOB: 1945/12/08  Chief Complaint  Patient presents with   Diabetes    Jennifer Blackburn is a 77 y.o. year old female who presented for a telephone visit.   They were referred to the pharmacist by their PCP for assistance in managing diabetes and medication access.  Patient is interested in CGM and would like to know cost/options.  She currently sees Endocrinology in [place]. She reports she is wearing a dexcom CGM sample and her current cost would be around $20-25 per 10 days (with dexcom).  Subjective:  Care Team: Primary Care Provider: Kerri Perches, MD ;Endocrinologist Gherghe; Next Scheduled Visit: 02/25/23  Medication Access/Adherence  Current Pharmacy:  [id number] - [place], [place] - [address] AT [facility] [address] [place] [address] Phone: [phone] Fax: [phone]  [facility] - [place], [place] - [address] [place] [address] Phone: [phone] Fax: [phone]   Diabetes: managed by endocrine--we are solely assisting with CGM retrieval   Current medications:  Toujeo, ozempic, metformin  Current medication access support: novo nordisk via Endo    Objective:  Lab Results  Component Value Date   HGBA1C 9.4 (A) 06/08/2022    Lab Results  Component Value Date   CREATININE 0.90 02/21/2023   BUN 16 02/21/2023   NA 137 02/21/2023   K 4.8 02/21/2023   CL 98 02/21/2023   CO2 24 02/21/2023    Lab Results  Component Value Date   CHOL 136 02/21/2023   HDL 45 02/21/2023   LDLCALC 67 02/21/2023   TRIG 135 02/21/2023   CHOLHDL 3.0 02/21/2023    Medications Reviewed Today     Reviewed by Danella Maiers, [facility] (Pharmacist) on 02/22/23 at 1001  Med List Status: <None>   Medication Order Taking? Sig Documenting Provider Last Dose Status Informant  ACCU-CHEK GUIDE test strip [id number] No USE AS INSTRUCTED TO CHECK BLOOD  SUGAR 2 TIMES PER DAY DX CODE E11.65 Kerri Perches, MD Taking Active   benzonatate (TESSALON) 100 MG capsule [id number] No Take 1 capsule (100 mg total) by mouth 2 (two) times daily as needed for cough. Del Newman Nip, Tenna Child, FNP Taking Active   Cetirizine HCl 10 MG TBDP [id number] No Take one tablet by mouth two times daily for allergy  Patient taking differently: Take 1 tablet by mouth daily.   Kerri Perches, MD Taking Active Self  Cholecalciferol (VITAMIN D3) 50 MCG (2000 UT) TABS [id number] No Take 2,000 Units by mouth daily. [provider] Taking Active Self  Continuous Glucose Receiver (DEXCOM G7 RECEIVER) DEVI [id number] No Use to monitor blood sugar daily dx e11.59 Kerri Perches, MD Taking Active   Continuous Glucose Sensor [facility] G7 SENSOR) [place] [id number] No Use to monitor blood sugar daily Dx: E11.59 Kerri Perches, MD Taking Active   ezetimibe (ZETIA) 10 MG tablet [id number] No Take 1 tablet (10 mg total) by mouth daily. Kerri Perches, MD Taking Active   fluticasone [facility]) 50 MCG/ACT nasal spray [id number]  Place 2 sprays into both nostrils daily. Kerri Perches, MD  Expired 12/12/22 2359   gabapentin (NEURONTIN) 100 MG capsule [id number] No Take one capsule twice daily and three at bedtime Kerri Perches, MD Taking Active   hydrOXYzine (ATARAX) 10 MG tablet [id number] No Take one tablet twice daily and two tablets at bedtime as needed for generalized itching. Kerri Perches, MD Taking  Active   imipramine (TOFRANIL) 50 MG tablet [id number] No TAKE 1 TABLET AT BEDTIME Kerri Perches, MD Taking Active   insulin aspart (NOVOLOG FLEXPEN) 100 UNIT/ML FlexPen [id number] No Inject 10-15 Units into the skin 3 (three) times daily with meals.  Patient taking differently: Inject 15 Units into the skin 3 (three) times daily with meals. Inject 20-25 units wit meals   Carlus Pavlov, MD Taking Expired 06/22/22 2359   insulin glargine, 1 Unit  Dial, (TOUJEO SOLOSTAR) 300 UNIT/ML Solostar Pen [id number]  INJECT 30-50 UNITS TWO TIMES DAILY Carlus Pavlov, MD  Active   Insulin Pen Needle 32G X 4 MM MISC [id number] No Use 4x a day Carlus Pavlov, MD Taking Active Self  isosorbide mononitrate (IMDUR) 30 MG 24 hr tablet [id number] No TAKE HALF A TABLET BY MOUTH DAILY Runell Gess, MD Taking Active   Lancets (ACCU-CHEK MULTICLIX) lancets [id number] No Use as instructed three times daily dx 250.01 Kerri Perches, MD Taking Active Self  levothyroxine (SYNTHROID) 125 MCG tablet [id number] No TAKE 1 TABLET BY MOUTH EVERY DAY BEFORE Bari Mantis, MD Taking Active   metFORMIN (GLUCOPHAGE) 1000 MG tablet [id number] No TAKE 1 TABLET TWICE DAILY Kerri Perches, MD Taking Active   metoprolol tartrate (LOPRESSOR) 50 MG tablet [id number] No TAKE 1 TABLET BY MOUTH TWICE A DAY Runell Gess, MD Taking Active   pantoprazole (PROTONIX) 40 MG tablet [id number] No TAKE 1 TABLET BY MOUTH EVERY DAY Corbin Ade, MD Taking Active   PARoxetine (PAXIL) 20 MG tablet [id number] No TAKE 1 TABLET EVERY DAY Kerri Perches, MD Taking Active   pravastatin (PRAVACHOL) 80 MG tablet [id number] No TAKE 1 TABLET EVERY DAY Kerri Perches, MD Taking Active   Semaglutide, 2 MG/DOSE, 8 MG/3ML SOPN [id number]  Inject 2 mg as directed once a week. Carlus Pavlov, MD  Active              Assessment/Plan:   Josephine Igo CGM submitted to [facility], however they do not accept patient's insurance -Libre CGM order sent to [facility] (([phone]) to see if they take insurance/cheaper than Dexcom retail pharmacy copay of $20-25/10days -Endo to assist with Novo nordisk patient assistance program -my chart message sent to patient with this information    Kieth Brightly, PharmD, BCACP, CPP Clinical Pharmacist, [facility]

## 2023-02-23 ENCOUNTER — Other Ambulatory Visit: Payer: Self-pay | Admitting: Internal Medicine

## 2023-02-23 ENCOUNTER — Other Ambulatory Visit: Payer: Self-pay | Admitting: Family Medicine

## 2023-02-23 DIAGNOSIS — B9789 Other viral agents as the cause of diseases classified elsewhere: Secondary | ICD-10-CM

## 2023-02-25 ENCOUNTER — Ambulatory Visit: Payer: Medicare HMO | Admitting: Internal Medicine

## 2023-02-25 ENCOUNTER — Telehealth: Payer: Self-pay | Admitting: Family Medicine

## 2023-02-25 NOTE — Telephone Encounter (Signed)
Copied from CRM [id number]. Topic: Referral - Status >> Feb 25, 2023  2:43 PM Fuller Mandril wrote: Reason for CRM: [employer] called from [facility] - received referral and has been trying to reach pt with no answer. Calling from [phone]. She you continue to try pt for another week or if we can contact pt and have pt give her a call or let her know they have been trying to reach her. Thank You.

## 2023-02-25 NOTE — Telephone Encounter (Signed)
Noted  

## 2023-02-28 ENCOUNTER — Encounter: Payer: Medicare HMO | Attending: Family Medicine | Admitting: Nutrition

## 2023-02-28 VITALS — Ht 65.0 in | Wt 185.0 lb

## 2023-02-28 DIAGNOSIS — I2510 Atherosclerotic heart disease of native coronary artery without angina pectoris: Secondary | ICD-10-CM | POA: Insufficient documentation

## 2023-02-28 DIAGNOSIS — E782 Mixed hyperlipidemia: Secondary | ICD-10-CM | POA: Insufficient documentation

## 2023-02-28 DIAGNOSIS — I10 Essential (primary) hypertension: Secondary | ICD-10-CM | POA: Insufficient documentation

## 2023-02-28 DIAGNOSIS — E1159 Type 2 diabetes mellitus with other circulatory complications: Secondary | ICD-10-CM | POA: Diagnosis not present

## 2023-02-28 NOTE — Progress Notes (Unsigned)
Medical Nutrition Therapy  Appointment Start time:  1600  Appointment End time:  1630  Primary concerns today: DM Typ3 2  Referral diagnosis: E11.8 Preferred learning style: No Preference  Learning readiness: Ready   NUTRITION ASSESSMENT Dm Follow up   77 yr old bfemale referred for uncontrolled Type 2 DM. 10.1% in July 2024. Going to see Gherghe her Endocrinologist on Thursday. Going to pick up sensors today. Has been out of a sensor for 4 weeks. She flet the sensors have helped her a lot see what her BS are in real time to make adjustments with her eating habits. Will have to see about getting the sensors  resumed. Working on getting  fiancial assistance with medications. Touje 30 units at night, Metformin 1000 mg BID. Taking Novolog 22 units with meals.  On Ozempic. Cost has become an issues since she is in the donut hole. Gained about 8 lbs since October. Admits she has eaten more recently and eating out with her friends.  Started seeing a therapist for some stress in her life. Is on Paxil for depression but she is not sure it is helping as much. Is on Gabapentin but not sure she likes it since it makes her soo sleepy and tired and other side effects.   Sees Dr. Lafe Garin for Endcrinology and Dr. Lodema Hong, PCP.  Goals set previously  Increase whole plant based foods with meals-has been working on it. Eat breakfast at 9 am, lunch 12-2 and dinner 5-7 pm. Trying to do better. Drink only water-working on it. Use Dexcom to assess BS control-done Take medications as prescribed.-tried to be compliant Get A1C down to 7%-no results yet Call MD if BS drop in the 70's/low 80's for medication adjustments.    Clinical Medical Hx:  Past Medical History:  Diagnosis Date   Anxiety    Arthritis    CAD (coronary artery disease)    Depression    Diabetes mellitus type II    without complication   DJD (degenerative joint disease) of lumbar spine    Hypercholesteremia    Hyperlipidemia     Hypertension    benign    Hypothyroidism    Thyroid cancer (HCC) 2001    Medications:  Current Outpatient Medications on File Prior to Visit  Medication Sig Dispense Refill   ACCU-CHEK GUIDE test strip USE AS INSTRUCTED TO CHECK BLOOD SUGAR 2 TIMES PER DAY DX CODE E11.65 100 strip 3   benzonatate (TESSALON) 100 MG capsule Take 1 capsule (100 mg total) by mouth 2 (two) times daily as needed for cough. 20 capsule 0   Cetirizine HCl 10 MG TBDP Take one tablet by mouth two times daily for allergy (Patient taking differently: Take 1 tablet by mouth daily.) 60 tablet 5   Cholecalciferol (VITAMIN D3) 50 MCG (2000 UT) TABS Take 2,000 Units by mouth daily.     Continuous Glucose Receiver (DEXCOM G7 RECEIVER) DEVI Use to monitor blood sugar daily dx e11.59 1 each 1   Continuous Glucose Sensor (DEXCOM G7 SENSOR) MISC Use to monitor blood sugar daily Dx: E11.59 1 each 5   ezetimibe (ZETIA) 10 MG tablet Take 1 tablet (10 mg total) by mouth daily. 30 tablet 5   fluticasone (FLONASE) 50 MCG/ACT nasal spray SPRAY 2 SPRAYS INTO EACH NOSTRIL EVERY DAY 48 mL 1   gabapentin (NEURONTIN) 100 MG capsule TAKE ONE CAPSULE TWICE DAILY AND THREE AT BEDTIME 150 capsule 1   hydrOXYzine (ATARAX) 10 MG tablet Take one tablet twice daily  and two tablets at bedtime as needed for generalized itching. 360 tablet 1   imipramine (TOFRANIL) 50 MG tablet TAKE 1 TABLET AT BEDTIME 90 tablet 2   insulin aspart (NOVOLOG FLEXPEN) 100 UNIT/ML FlexPen Inject 10-15 Units into the skin 3 (three) times daily with meals. (Patient taking differently: Inject 15 Units into the skin 3 (three) times daily with meals. Inject 20-25 units wit meals) 15 mL 2   insulin glargine, 1 Unit Dial, (TOUJEO SOLOSTAR) 300 UNIT/ML Solostar Pen INJECT 30-50 UNITS TWO TIMES DAILY 9 mL 1   Insulin Pen Needle 32G X 4 MM MISC Use 4x a day 300 each 3   isosorbide mononitrate (IMDUR) 30 MG 24 hr tablet TAKE HALF A TABLET BY MOUTH DAILY 45 tablet 3   Lancets (ACCU-CHEK  MULTICLIX) lancets Use as instructed three times daily dx 250.01 100 each 5   levothyroxine (SYNTHROID) 125 MCG tablet TAKE 1 TABLET BY MOUTH EVERY DAY BEFORE BREAKFAST 90 tablet 3   metFORMIN (GLUCOPHAGE) 1000 MG tablet TAKE 1 TABLET TWICE DAILY 180 tablet 1   metoprolol tartrate (LOPRESSOR) 50 MG tablet TAKE 1 TABLET BY MOUTH TWICE A DAY 180 tablet 2   pantoprazole (PROTONIX) 40 MG tablet TAKE 1 TABLET BY MOUTH EVERY DAY 90 tablet 1   PARoxetine (PAXIL) 20 MG tablet TAKE 1 TABLET EVERY DAY 90 tablet 3   pravastatin (PRAVACHOL) 80 MG tablet TAKE 1 TABLET EVERY DAY 90 tablet 3   Semaglutide, 2 MG/DOSE, 8 MG/3ML SOPN Inject 2 mg as directed once a week. 9 mL 2   No current facility-administered medications on file prior to visit.    Labs:  Lab Results  Component Value Date   HGBA1C 9.4 (A) 06/08/2022    Notable Signs/Symptoms: Increased fatigue, frequent urination, hunger, poor sleep  Lifestyle & Dietary Hx Lives with her husband. Retired.  Estimated daily fluid intake: 40 oz Supplements:  Sleep: Poor Stress / self-care: her health Current average weekly physical activity: ADL  24-Hr Dietary Recall Eats 3 meals per day. Tends to have a lot of processed food due to husband's preferences. Eats out some.  Estimated Energy Needs Calories: 1200 Carbohydrate: 135g Protein: 90g Fat: 33g   NUTRITION DIAGNOSIS  NB-1.1 Food and nutrition-related knowledge deficit As related to Diabetes Type 2 Uncontrolled.  As evidenced by A1C 10.1%.   NUTRITION INTERVENTION  Nutrition education (E-1) on the following topics:  Nutrition and Diabetes education provided on My Plate, CHO counting, meal planning, portion sizes, timing of meals, avoiding snacks between meals unless having a low blood sugar, target ranges for A1C and blood sugars, signs/symptoms and treatment of hyper/hypoglycemia, monitoring blood sugars, taking medications as prescribed, benefits of exercising 30 minutes per day and  prevention of complications of DM.  Lifestyle Medicine  - Whole Food, Plant Predominant Nutrition is highly recommended: Eat Plenty of vegetables, Mushrooms, fruits, Legumes, Whole Grains, Nuts, seeds in lieu of processed meats, processed snacks/pastries red meat, poultry, eggs.    -It is better to avoid simple carbohydrates including: Cakes, Sweet Desserts, Ice Cream, Soda (diet and regular), Sweet Tea, Candies, Chips, Cookies, Store Bought Juices, Alcohol in Excess of  1-2 drinks a day, Lemonade,  Artificial Sweeteners, Doughnuts, Coffee Creamers, "Sugar-free" Products, etc, etc.  This is not a complete list.....  Exercise: If you are able: 30 -60 minutes a day ,4 days a week, or 150 minutes a week.  The longer the better.  Combine stretch, strength, and aerobic activities.  If you were told  in the past that you have high risk for cardiovascular diseases, you may seek evaluation by your heart doctor prior to initiating moderate to intense exercise programs.  Assessment:  Primary concerns today: Patient here for initiation of [employer] Continuous Glucose Monitoring. Dexcom G7  Medications: Toujeo and Novolog     Intervention:   Understanding Glucose Sensing Dexcom G7 Programming Sensor Information  High Glucose: 250 mg/dl  Low Glucose 70 mg/dl  Other settings to be added at follow up visit Starting [employer] of Product  Entering BG, Paramedic and Graphs with Public librarian and Alarms   Follow Up Patient to see MD for insulin dose adjustments and to schedule visit with me for CGM follow up within 2 months week(s).  Handouts Provided Include  Lifestyle Medicine handouts  Learning Style & Readiness for Change Teaching method utilized: Visual & Auditory  Demonstrated degree of understanding via: Teach Back  Barriers to learning/adherence to lifestyle change: Non3  Goals Established by Pt  Avoid sweets, snacks  and heavy desserts. Increase fruits, vegetables and whole grains. Start exercising 30 minutes 3 times per week Drink only water and cut sodas, juice or tea.  MONITORING & EVALUATION Dietary intake, weekly physical activity, and BS  in 3 months.  Next Steps  Patient is to work on meal planning and focus on whole plant base foods.Marland Kitchen

## 2023-03-01 ENCOUNTER — Encounter: Payer: Self-pay | Admitting: Nutrition

## 2023-03-01 ENCOUNTER — Telehealth: Payer: Self-pay | Admitting: Family Medicine

## 2023-03-01 ENCOUNTER — Other Ambulatory Visit: Payer: Self-pay | Admitting: Family Medicine

## 2023-03-01 NOTE — Patient Instructions (Signed)
Goals  Avoid sweets, snacks and heavy desserts. Increase fruits, vegetables and whole grains. Start exercising 30 minutes 3 times per week Drink only water and cut sodas, juice or tea.

## 2023-03-01 NOTE — Telephone Encounter (Signed)
Not our patient will route to the correct pcp office   Copied from CRM [id number]. Topic: General - Other >> Mar 01, 2023  4:02 PM Sasha H wrote: Reason for CRM: [facility] would like a nurse to reach out to pt letting them know they have been trying to reach them in regards to supplies from referral

## 2023-03-02 ENCOUNTER — Ambulatory Visit (INDEPENDENT_AMBULATORY_CARE_PROVIDER_SITE_OTHER): Payer: Medicare HMO | Admitting: Psychiatry

## 2023-03-02 DIAGNOSIS — Z630 Problems in relationship with spouse or partner: Secondary | ICD-10-CM | POA: Diagnosis not present

## 2023-03-02 DIAGNOSIS — F439 Reaction to severe stress, unspecified: Secondary | ICD-10-CM | POA: Diagnosis not present

## 2023-03-02 NOTE — Progress Notes (Signed)
IN-PERSON  THERAPIST PROGRESS NOTE  Session Time: Wednesday `03/02/2023 3:07 PM  -  3:58 PM   Participation Level: Active  Behavioral Response: CasualAlertAnxious  Type of Therapy: Individual Therapy  Treatment Goals addressed: Establish therapeutic alliance, identify and verbalize emotions, improve coping skills  ProgressTowards Goals: Formal treatment plan will be developed next session  Interventions: Supportive  Summary: Jennifer Blackburn is a 77 y.o. female who is referred for services by PCP Dr. Syliva Overman due to pt experiencing stress. She denies any psychiatric hospitalizations. She is a returning pt to this clinician and last was seen in 2016.  Patient is resuming services as she reports increased stress triggered by health issues, increased fatigue, and marital issues.  Patient's current symptoms include fatigue, worry, and muscle tension.    Patient last was seen for the assessment appointment about 2 months ago.  She reports continued stress regarding health issues as she is having difficulty paying for medication due to being in the donut hole.  She has made several efforts to try to obtain assistance but all have been unsuccessful as she did not qualify for assistance.  However, she plans to see another medical provider later this month who has indicated she would be able to help patient regarding medications.  Patient also is relieved that she will be out of the donut hole by January 2025.  Patient reports continued marital stress as husband remains critical and negative per her report.  She still reports often feeling on edge and feeling as though she has to walk on eggshells.  She reports trying to relax when husband is not at home.  She also reports recently spending the night with her daughter and going on a shopping trip.  She reports this was very helpful.   Suicidal/Homicidal: Nowithout intent/plan  Therapist Response: Reviewed symptoms, discussed stressors,  facilitated expression of thoughts and feelings, validated feelings, gathered more information from patient regarding her marriage, assisted patient began to identify patterns of interaction in her marriage, assisted patient identify the effects on her thoughts/mood/behavior, praised and reinforced patient's efforts to begin to schedule time for self, developed plan with patient to continue efforts  Plan: Return again in 2 weeks.  Diagnosis: Stress due to marital problems  Collaboration of Care: Primary Care Provider AEB patient sees PCP Dr. Syliva Overman  Patient/Guardian was advised Release of Information must be obtained prior to any record release in order to collaborate their care with an outside provider. Patient/Guardian was advised if they have not already done so to contact the registration department to sign all necessary forms in order for [place] to release information regarding their care.   Consent: Patient/Guardian gives verbal consent for treatment and assignment of benefits for services provided during this visit. Patient/Guardian expressed understanding and agreed to proceed.   Adah Salvage, LCSW 03/02/2023

## 2023-03-02 NOTE — Telephone Encounter (Signed)
Lmtrc-kg

## 2023-03-03 ENCOUNTER — Ambulatory Visit: Payer: Medicare HMO | Admitting: Internal Medicine

## 2023-03-03 ENCOUNTER — Telehealth: Payer: Self-pay

## 2023-03-03 ENCOUNTER — Encounter: Payer: Self-pay | Admitting: Internal Medicine

## 2023-03-03 VITALS — BP 124/60 | HR 81 | Ht 65.0 in | Wt 184.2 lb

## 2023-03-03 DIAGNOSIS — E1159 Type 2 diabetes mellitus with other circulatory complications: Secondary | ICD-10-CM | POA: Diagnosis not present

## 2023-03-03 DIAGNOSIS — E890 Postprocedural hypothyroidism: Secondary | ICD-10-CM

## 2023-03-03 DIAGNOSIS — C73 Malignant neoplasm of thyroid gland: Secondary | ICD-10-CM | POA: Diagnosis not present

## 2023-03-03 DIAGNOSIS — Z794 Long term (current) use of insulin: Secondary | ICD-10-CM

## 2023-03-03 DIAGNOSIS — Z7984 Long term (current) use of oral hypoglycemic drugs: Secondary | ICD-10-CM

## 2023-03-03 LAB — POCT GLYCOSYLATED HEMOGLOBIN (HGB A1C): Hemoglobin A1C: 9.5 % — AB (ref 4.0–5.6)

## 2023-03-03 NOTE — Progress Notes (Signed)
Patient ID: Jennifer Blackburn, female   DOB: Dec 29, 1945, 77 y.o.   MRN: [id number]   HPI: Jennifer Blackburn is a 77 y.o.-year-old female, returning for follow-up for DM2, dx in 2001, insulin-dependent since 2017, uncontrolled, with long-term complications (DR, CAD) and also papillary thyroid cancer and postsurgical hypothyroidism.  Last visit 4.5 months ago.  Interim history: No increased urination, blurry vision, chest pain. She stopped lemonade before last visit. She gained weight since last OV. She stopped Ozempic 2/2 price in 12/2022.   DM2: Reviewed HbA1c levels: Lab Results  Component Value Date   HGBA1C 11.2 10/11/2022   HGBA1C 9.4 (A) 06/08/2022   HGBA1C 9.1 (A) 12/31/2021   HGBA1C 8.8 (A) 08/27/2021   HGBA1C 8.8 (A) 04/28/2021   HGBA1C 10.1 (A) 12/23/2020   HGBA1C 9.0 (A) 08/07/2020   HGBA1C 10.5 (A) 05/09/2020   HGBA1C 9.7 (A) 01/04/2020   HGBA1C 8.4 (A) 08/29/2019  01/12/2018: HbA1c 8.7% 08/05/2017: HbA1c 8.9% 07/11/2017: HbA1c 9.1%  Pt is on a regimen of: - Metformin 1000 mg 2x a day with meals. - Toujeo 30 units in a.m. and 50 units at bedtime >> 30 units in a.m. and 40 units at bedtime - NovoLog 10 -15 units before/after each meal >> not consistently >> 20-25 >> 18-20 units before meals - Ozempic 0.5 ...>>  2 >> 1 >> 2 mg weekly (through the patient assistance program) >> stopped as she needed another appl. She was on Ozempic 0.5 mg weekly in a.m. >> 1 mg  - constipation >> stopped 06/2018.  We added it back 09/2018. She was on Victoza >> $$$. She was on Jardiance >> $$$. She was on Actos >> swelling - stopped Spring 2019. She was on Glipizide XL 5 mg daily >> stopped 07/2017.  We retried glipizide 5 mg twice a day but had to stop 12/2018 due to lack of effect.  Pt checks her sugars >4x times a day: - am:189-210 - 2h after b'fast:  - lunch: 200s - 2h after lunch: n/c - dinner: n/c - 2h after dinner: 190-201 - bedtime: n/c    Prev.: - am: 120-201 >> 110-180  >> 260s, after increasing insulin: 150, 170 - 2h after b'fast: n/c - before lunch: n/c  >> 200 - 2h after lunch: 265-300s >>... 130-180 >> n/c >> 337 - before dinner: n/c >> 185-190 >> n/c  - 2h after dinner: n/c >> 150-188 >> n/c >> 200s - bedtime:  170-180s >> 160s >> 140-210 >> n/c - nighttime: n/c  Lowest sugar was 90s >> 110 >> 58; she has hypoglycemia awareness in the 60s. Highest sugar was 200s >> 200s >> 300s.  Glucometer: AccuChek Aviva  Pt's meals are: - Breakfast:toast, cereal, milk; oatmeal; bacon + egg >> oatmeal + coffee >> cereals - Lunch: skips >> 2 eggs, fruit - Dinner: hamburger, or chicken, or fish, + veggies, bread, water and tea - Snacks: 2- fruit She was seeing a dietitian in [place].  -No CKD, last BUN/creatinine:  Lab Results  Component Value Date   BUN 16 02/21/2023   BUN 11 09/09/2022   CREATININE 0.90 02/21/2023   CREATININE 0.72 09/09/2022   Lab Results  Component Value Date   MICRALBCREAT 11 06/11/2022   MICRALBCREAT 12 04/21/2021   MICRALBCREAT 13 01/28/2021   MICRALBCREAT 11.5 06/14/2017   MICRALBCREAT 20.8 07/10/2014   MICRALBCREAT 10.4 05/02/2013   MICRALBCREAT 5.3 04/06/2012   MICRALBCREAT 3.7 12/21/2010   MICRALBCREAT 29.8 12/13/2009   MICRALBCREAT 7.5 10/17/2008  On  quinapril.  -+ HL; last set of lipids: Lab Results  Component Value Date   CHOL 136 02/21/2023   HDL 45 02/21/2023   LDLCALC 67 02/21/2023   TRIG 135 02/21/2023   CHOLHDL 3.0 02/21/2023  On pravastatin 80.  - last eye exam was 04/08/2022: + DR . Dr. Montez Morita in [place].  She has cataracts OU.   -No numbness and tingling in her feet. Has a podiatrist.  Last foot exam 10/11/2022.  On ASA 81.  Pt has no FH of DM.  H/o Thyroid cancer (2001), s/p RAI treatment, now with postsurgical hypothyroidism.  Previously on brand-name Synthroid, now generic levothyroxine.  Pt is on levothyroxine 125 mcg daily (dose decreased 08/2021), taken: - in am - fasting - at  least 30 min from b'fast - no calcium - no iron - no multivitamins - + PPIs (Protonix) moved to pm - not on Biotin  Reviewed her TFTs: Lab Results  Component Value Date   TSH 0.63 10/11/2022   TSH 0.72 12/31/2021   TSH 0.06 (L) 08/27/2021   TSH 0.40 08/07/2020   TSH 1.60 05/09/2020   TSH 0.05 (L) 08/31/2019   TSH 0.04 (L) 12/28/2018   TSH 0.06 (L) 09/21/2018   TSH 0.61 05/26/2018   TSH 2.49 03/23/2016   TSH 0.070 (L) 10/13/2010   TSH 0.035 (L) 09/22/2006   TSH 0.015 (L) 06/17/2006  11/03/2017: TSH 0.044  Her thyroglobulin levels are detectable: Lab Results  Component Value Date   THYROGLB 0.5 (L) 10/11/2022   THYROGLB 0.3 (L) 08/27/2021   THYROGLB 0.4 (L) 08/07/2020   THYROGLB 1.2 (L) 05/09/2020   THYROGLB 0.4 (L) 08/31/2019   THYROGLB 0.3 (L) 09/21/2018   THYROGLB 0.7 (L) 05/26/2018   Lab Results  Component Value Date   THGAB <1 10/11/2022   THGAB <1 08/27/2021   THGAB <1 08/07/2020   THGAB <1 05/09/2020   THGAB <1 08/31/2019   THGAB <1 09/21/2018   THGAB <1 05/26/2018   Reviewed records from Dr. Patrecia Pace: Patient is status post total thyroidectomy in 2004 1.3 cm papillary thyroid cancer of the right lobe, followed by 3x RAI tx's with  33 mCi I-131 and a fourth RAI dose inpatient: 125 mCi, at [facility].    Whole-body scan on 09/08/2007 showed an increase in the thyroid bed activity and the follow up study with PET scan 10/08/2007 showed mildly hypermetabolic cervical lymph nodes.    Repeat PET 05/10/2008 showed no significant changes felt likely to be due to to a reactive process.  11/03/2017: TSH 0.044, thyroglobulin 0.2, ATA <1.0  04/05/2017: TSH 0.021 (0.45-4.5), thyroglobulin 0.3 (by IMA), ATA <1.0  Reviewed imaging test reports in the system: Thyroid ultrasound (06/10/2018): 1.1 cm nonspecific calcified lesion in the right upper neck.  CT was recommended: There is no residual or recurrent tissue in the right or left thyroid beds. There is no evidence of abnormal  adenopathy by short axis diameter measurement criteria There is a nonspecific calcified soft tissue mass measuring 1.1 cm in the right superior neck. This is of unknown significance. CT neck can be performed to further characterize.  CT (10/18/2018): Status post thyroidectomy. No soft tissue mass within the thyroidectomy bed. A 13 x 7 mm ovoid focus of calcification in the right aspect of the thyroidectomy bed likely corresponding with the finding on recent neck ultrasound and is unchanged as compared to neck CT 10/24/2007, benign.   No pathologically enlarged cervical chain lymph nodes. A nonenlarged calcified right supraclavicular lymph node is  new from prior neck CT 10/24/2007 but also favored benign. Attention recommended on follow-up.  Neck U/S (06/03/2020): Ultrasound of the neck demonstrates no evidence of visible residual thyroid tissue or abnormal soft tissue mass.   Ovoid area of densely shadowing calcification again noted to the right of midline as seen by prior ultrasound and also demonstrated by prior CT studies of the neck with documented stability between 2009 and 2020 studies.   Small left cervical lymph node has a similar appearance to the prior ultrasound study measuring 0.5 cm in short axis.   IMPRESSION: No evidence by ultrasound of recurrent thyroid malignancy in the neck.  Stable right neck calcification which has been documented to be benign and stable by prior CT.   Small left cervical lymph node demonstrates stable appearance and size by ultrasound  compared to the 2020 ultrasound.  Pt denies: - feeling nodules in neck - hoarseness - dysphagia - choking  She also has a history of HTN and anemia. She had an episode of gastroenteritis 05/28/2022. scan: diverticulitis, GB stones and kidney stones.  Lipase was normal.  ROS: + See HPI  I reviewed pt's medications, allergies, PMH, social hx, family hx, and changes were documented in the history of present  illness. Otherwise, unchanged from my initial visit note.  Past Medical History:  Diagnosis Date   Anxiety    Arthritis    CAD (coronary artery disease)    Depression    Diabetes mellitus type II    without complication   DJD (degenerative joint disease) of lumbar spine    Hypercholesteremia    Hyperlipidemia    Hypertension    benign    Hypothyroidism    Thyroid cancer (HCC) 2001   Past Surgical History:  Procedure Laterality Date   ABDOMINAL HYSTERECTOMY     CARDIAC CATHETERIZATION     CATARACT EXTRACTION W/PHACO Right 07/17/2012   Procedure: CATARACT EXTRACTION PHACO AND INTRAOCULAR LENS PLACEMENT (IOC);  Surgeon: Gemma Payor, MD;  Location: [facility];  Service: Ophthalmology;  Laterality: Right;  CDE:25.51   CATARACT EXTRACTION W/PHACO Left 09/17/2016   Procedure: CATARACT EXTRACTION PHACO AND INTRAOCULAR LENS PLACEMENT LEFT EYE;  Surgeon: Gemma Payor, MD;  Location: [facility];  Service: Ophthalmology;  Laterality: Left;  CDE: 19.23   COLONOSCOPY     COLONOSCOPY N/A 01/15/2014   Procedure: COLONOSCOPY;  Surgeon: Corbin Ade, MD;  Location: [facility];  Service: Endoscopy;  Laterality: N/A;  9:00 AM   COLONOSCOPY WITH PROPOFOL N/A 01/25/2019   Procedure: COLONOSCOPY WITH PROPOFOL;  Surgeon: Corbin Ade, MD; diverticulosis in the entire colon, otherwise normal exam.  No repeat colonoscopy due to age.   DOPPLER ECHOCARDIOGRAPHY     ESOPHAGOGASTRODUODENOSCOPY (EGD) WITH PROPOFOL N/A 01/25/2019   Procedure: ESOPHAGOGASTRODUODENOSCOPY (EGD) WITH PROPOFOL;  Surgeon: Corbin Ade, MD; normal esophagus without dilation due to inability to pass dilator beyond the hypopharynx, 1 small nonbleeding gastric ulcer s/p biopsied, otherwise normal exam.  Pathology with ulcer with reactive changes, no H. pylori, metaplasia, dysplasia, or malignancy.   ESOPHAGOGASTRODUODENOSCOPY (EGD) WITH PROPOFOL N/A 08/21/2019   Procedure: ESOPHAGOGASTRODUODENOSCOPY (EGD) WITH PROPOFOL;  Surgeon: Corbin Ade, MD; Normal esophagus. Previously noted gastric ulcer completely healed. Normal examined duodenum.    JOINT REPLACEMENT  07/01/2010   left hip   KNEE SURGERY Right    arthroscopy   left hip replaced  07/01/2010   SPINE SURGERY  2006   cervical   stress dipyridamole myocardial perfusion     THYROIDECTOMY  TONSILLECTOMY     VESICOVAGINAL FISTULA CLOSURE W/ TAH     Social History   Socioeconomic History   Marital status: Married    Spouse name: Not on file   Number of children: 2   Years of education: 14   Highest education level: Some college, no degree  Occupational History   Occupation: retired     Comment: bank  Tobacco Use   Smoking status: Never    Passive exposure: Yes   Smokeless tobacco: Never   Tobacco comments:    Husband smokes in the home  Vaping Use   Vaping status: Never Used  Substance and Sexual Activity   Alcohol use: No   Drug use: No   Sexual activity: Not Currently  Other Topics Concern   Not on file  Social History Narrative   Lives with husband, at home   No caffeine   Social Drivers of Corporate investment banker Strain: Low Risk  (06/10/2022)   Overall Financial Resource Strain (CARDIA)    Difficulty of Paying Living Expenses: Not hard at all  Food Insecurity: No Food Insecurity (06/10/2022)   Hunger Vital Sign    Worried About Running Out of Food in the Last Year: Never true    Ran Out of Food in the Last Year: Never true  Transportation Needs: No Transportation Needs (06/10/2022)   PRAPARE - Administrator, Civil Service (Medical): No    Lack of Transportation (Non-Medical): No  Physical Activity: Insufficiently Active (09/13/2022)   Exercise Vital Sign    Days of Exercise per Week: 3 days    Minutes of Exercise per Session: 20 min  Stress: No Stress Concern Present (06/10/2022)   [employer] of Occupational Health - Occupational Stress Questionnaire    Feeling of Stress : Not at all  Social Connections: Socially  Integrated (06/10/2022)   Social Connection and Isolation Panel [NHANES]    Frequency of Communication with Friends and Family: More than three times a week    Frequency of Social Gatherings with Friends and Family: Twice a week    Attends Religious Services: More than 4 times per year    Active Member of [employer] or Organizations: Yes    Attends Engineer, structural: More than 4 times per year    Marital Status: Married  Catering manager Violence: Not At Risk (08/31/2021)   Humiliation, Afraid, Rape, and Kick questionnaire    Fear of Current or Ex-Partner: No    Emotionally Abused: No    Physically Abused: No    Sexually Abused: No   Current Outpatient Medications  Medication Sig Dispense Refill   ACCU-CHEK GUIDE test strip USE AS INSTRUCTED TO CHECK BLOOD SUGAR 2 TIMES PER DAY DX CODE E11.65 100 strip 3   benzonatate (TESSALON) 100 MG capsule Take 1 capsule (100 mg total) by mouth 2 (two) times daily as needed for cough. 20 capsule 0   Cetirizine HCl 10 MG TBDP Take one tablet by mouth two times daily for allergy (Patient taking differently: Take 1 tablet by mouth daily.) 60 tablet 5   Cholecalciferol (VITAMIN D3) 50 MCG (2000 UT) TABS Take 2,000 Units by mouth daily.     Continuous Glucose Receiver (DEXCOM G7 RECEIVER) DEVI Use to monitor blood sugar daily dx e11.59 1 each 1   Continuous Glucose Sensor (DEXCOM G7 SENSOR) MISC Use to monitor blood sugar daily Dx: E11.59 1 each 5   ezetimibe (ZETIA) 10 MG tablet Take 1 tablet (10  mg total) by mouth daily. 30 tablet 5   fluticasone (FLONASE) 50 MCG/ACT nasal spray SPRAY 2 SPRAYS INTO EACH NOSTRIL EVERY DAY 48 mL 1   gabapentin (NEURONTIN) 100 MG capsule TAKE ONE CAPSULE TWICE DAILY AND THREE AT BEDTIME 150 capsule 1   hydrOXYzine (ATARAX) 10 MG tablet Take one tablet twice daily and two tablets at bedtime as needed for generalized itching. 360 tablet 1   imipramine (TOFRANIL) 50 MG tablet TAKE 1 TABLET AT BEDTIME 90 tablet 3   insulin  aspart (NOVOLOG FLEXPEN) 100 UNIT/ML FlexPen Inject 10-15 Units into the skin 3 (three) times daily with meals. (Patient taking differently: Inject 15 Units into the skin 3 (three) times daily with meals. Inject 20-25 units wit meals) 15 mL 2   insulin glargine, 1 Unit Dial, (TOUJEO SOLOSTAR) 300 UNIT/ML Solostar Pen INJECT 30-50 UNITS TWO TIMES DAILY 9 mL 1   Insulin Pen Needle 32G X 4 MM MISC Use 4x a day 300 each 3   isosorbide mononitrate (IMDUR) 30 MG 24 hr tablet TAKE HALF A TABLET BY MOUTH DAILY 45 tablet 3   Lancets (ACCU-CHEK MULTICLIX) lancets Use as instructed three times daily dx 250.01 100 each 5   levothyroxine (SYNTHROID) 125 MCG tablet TAKE 1 TABLET BY MOUTH EVERY DAY BEFORE BREAKFAST 90 tablet 3   metFORMIN (GLUCOPHAGE) 1000 MG tablet TAKE 1 TABLET TWICE DAILY 180 tablet 1   metoprolol tartrate (LOPRESSOR) 50 MG tablet TAKE 1 TABLET BY MOUTH TWICE A DAY 180 tablet 2   pantoprazole (PROTONIX) 40 MG tablet TAKE 1 TABLET BY MOUTH EVERY DAY 90 tablet 1   PARoxetine (PAXIL) 20 MG tablet TAKE 1 TABLET EVERY DAY 90 tablet 3   pravastatin (PRAVACHOL) 80 MG tablet TAKE 1 TABLET EVERY DAY 90 tablet 3   Semaglutide, 2 MG/DOSE, 8 MG/3ML SOPN Inject 2 mg as directed once a week. 9 mL 2   No current facility-administered medications for this visit.     Allergies  Allergen Reactions   Lipitor [Atorvastatin Calcium] Other (See Comments)    Muscle aches   Actos [Pioglitazone] Other (See Comments)    Peripheral edema   Crestor [Rosuvastatin] Other (See Comments)    Muscle aches   Daypro [Oxaprozin] Hives   Nsaids Hives   Sulfonamide Derivatives Hives   Family History  Problem Relation Age of Onset   Heart attack Father    Heart failure Mother    Asthma Daughter    Sleep apnea Son        CPAP   Colon cancer Neg Hx    PE: BP 124/60   Pulse 81   Ht 5\' 5"  (1.651 m)   Wt 184 lb 3.2 oz (83.6 kg)   LMP 11/11/2016   SpO2 97%   BMI 30.65 kg/m  Wt Readings from Last 3 Encounters:   03/03/23 184 lb 3.2 oz (83.6 kg)  02/28/23 185 lb (83.9 kg)  12/30/22 177 lb (80.3 kg)   Constitutional: overweight, in NAD Eyes: no exophthalmos ENT: no neck masses palpated, no cervical lymphadenopathy Cardiovascular: RRR, No MRG Respiratory: CTA B Musculoskeletal: no deformities Skin:  no rashes Neurological: no tremor with outstretched hands  ASSESSMENT: 1. DM2, insulin-dependent, uncontrolled, with long-term complications - CAD - DR  2.  Papillary thyroid cancer  3.  Postsurgical hypothyroidism  PLAN:  1. Patient with longstanding, uncontrolled, type 2 diabetes, on metformin, basal large bolus insulin regimen and prev. weekly GLP-1 receptor agonist (now off due to price), with much worsened  control at last visit.  At that time, HbA1c returned 11.2%, increased from 9.4%.  I previously suggested a CeQur insulin pump to improve compliance with, but she continued with insulin injections.  At last visit, she mentioned that sugars started to improve after she started to take NovoLog consistently.  She was not taking it 15 minutes before meals and I advised her to start doing so.  We also increase the doses.  We continued the same metformin, Toujeo, and milligram doses at that time.  I strongly advised her to refrain from drinking sweet drinks and eating sweets as much as possible due to the very uncontrolled blood sugars. CGM interpretation: -At today's visit, we reviewed her CGM downloads from a month ago: It appears that 54% of values are in target range (goal >70%), while 46% are higher than 180 (goal <25%), and 0% are lower than 70 (goal <4%).  The calculated average blood sugar is 176.  The projected HbA1c for the next 3 months (GMI) is 7.1%. -Reviewing the CGM trends, sugars are fluctuating within the target range but decreasing significantly after lunch and then after dinner. Upon questioning, she was on Ozempic 2 months ago, but she stopped due to price.  At last visit, we did  give her paperwork for patient assistance programs, but she only brought this today - the form is now expired ... Since her sugars in the last month are higher than before, mostly in the 200s, we discussed about starting back on Ozempic and I did recommend to increase the dose to 2 mg weekly.  We can continue the rest of the regimen for now.  The HbA1c did improve since last visit (see below) - I suggested to:  Patient Instructions  Please continue: - Metformin 1000 mg 2x a day with meals. - Toujeo 30 units in a.m. and 40 units at bedtime - Novolog 18-20 units before each meal   Restart: - Ozempic 2 mg weekly (start with 36 clicks - 1 mg weekly x2 dose)  Please continue levothyroxine 125 mcg daily.  Take the thyroid hormone every day, with water, at least 30 minutes before breakfast, separated by at least 4 hours from: - acid reflux medications - calcium - iron - multivitamins  Please return in 3 months.  - we checked her HbA1c: 9.5% (lower) - advised to check sugars at different times of the day - 4x a day, rotating check times - advised for yearly eye exams >> she is UTD - return to clinic in 3 months  2.  Papillary thyroid cancer -Previous thyroid cancer records were reviewed from Dr. Patrecia Pace -She has metastatic thyroid cancer and had 4 RAI treatments.  She also had increased signal on the PET scan from 2009, however in 2010, another PET scan showed only possible inflammatory lymph nodes in the area -The latest ultrasound from 05/2020 showed no concerning masses.  Previous ultrasound showed a specific calcified region in the right upper neck in the 2019 neck CT showed that the calcified mass was stable and most likely benign -Last visit, the thyroglobulin was still detectable, at 0.5, slightly higher than before, but overall not much changed over the last 5 years. -We will recheck her thyroglobulin and antibody levels now  3.  Postsurgical hypothyroidism - latest thyroid labs  reviewed with pt. >> normal: Lab Results  Component Value Date   TSH 0.63 10/11/2022  - she continues on LT4 125 mcg daily - pt feels good on this dose. - we  discussed about taking the thyroid hormone every day, with water, >30 minutes before breakfast, separated by >4 hours from acid reflux medications, calcium, iron, multivitamins. Pt. is taking it correctly. - will check thyroid tests today: TSH  - If labs are abnormal, she will need to return for repeat TFTs in 1.5 months  Needs refills.   Component     Latest Ref Rng 03/03/2023  TSH     0.40 - 4.50 mIU/L 0.10 (L)   Hemoglobin A1C     4.0 - 5.6 % 9.5 !   TSH is suppressed.  We need to back off the LT4 to 112 mcg daily and recheck her thyroid tests at next visit, in 3 months.  Carlus Pavlov, MD PhD [facility]

## 2023-03-03 NOTE — Telephone Encounter (Signed)
Sample  Medication:Ozempic  Dose: 0.25/0.5 mg Quantity:1 OZH:[id number] EXP:04/21/2024  Gaylen Pereira,RMA  Sample was provided in the office.

## 2023-03-03 NOTE — Patient Instructions (Addendum)
Please continue: - Metformin 1000 mg 2x a day with meals. - Toujeo 30 units in a.m. and 40 units at bedtime - Novolog 18-20 units before each meal   Restart: - Ozempic 2 mg weekly (start with 36 clicks - 1 mg weekly x2 dose)  Please continue levothyroxine 125 mcg daily.  Take the thyroid hormone every day, with water, at least 30 minutes before breakfast, separated by at least 4 hours from: - acid reflux medications - calcium - iron - multivitamins  Please return in 3 months.

## 2023-03-04 ENCOUNTER — Other Ambulatory Visit: Payer: Self-pay

## 2023-03-04 ENCOUNTER — Encounter: Payer: Self-pay | Admitting: Family Medicine

## 2023-03-04 LAB — THYROGLOBULIN LEVEL: Thyroglobulin: 0.3 ng/mL — ABNORMAL LOW

## 2023-03-04 LAB — TSH: TSH: 0.1 m[IU]/L — ABNORMAL LOW (ref 0.40–4.50)

## 2023-03-04 LAB — THYROGLOBULIN ANTIBODY: Thyroglobulin Ab: <1 [IU]/mL (ref <=1)

## 2023-03-04 MED ORDER — LEVOTHYROXINE SODIUM 112 MCG PO TABS: ORAL_TABLET | ORAL | 3 refills | Status: DC

## 2023-03-04 MED ORDER — IMIPRAMINE HCL 50 MG PO TABS: 50.0000 mg | ORAL_TABLET | Freq: Every day | ORAL | 3 refills | Status: DC

## 2023-03-10 ENCOUNTER — Telehealth: Payer: Self-pay | Admitting: Internal Medicine

## 2023-03-10 DIAGNOSIS — E1165 Type 2 diabetes mellitus with hyperglycemia: Secondary | ICD-10-CM | POA: Diagnosis not present

## 2023-03-10 NOTE — Telephone Encounter (Signed)
Patient dropped of Novo Nordisk patient assistance paperwork and requested a RX for Novolog be sent to [facility] in [place] to that she can have her medication until Patient Assistance is processed

## 2023-03-10 NOTE — Telephone Encounter (Signed)
 Patient dropped off patient assistance paperwork.  This was placed in the providers in box at the front desk.  Company is [employer]

## 2023-03-21 ENCOUNTER — Ambulatory Visit (INDEPENDENT_AMBULATORY_CARE_PROVIDER_SITE_OTHER): Payer: Medicare HMO | Admitting: Psychiatry

## 2023-03-21 ENCOUNTER — Encounter (HOSPITAL_COMMUNITY): Payer: Self-pay

## 2023-03-21 DIAGNOSIS — F439 Reaction to severe stress, unspecified: Secondary | ICD-10-CM

## 2023-03-21 DIAGNOSIS — Z630 Problems in relationship with spouse or partner: Secondary | ICD-10-CM

## 2023-03-21 NOTE — Telephone Encounter (Signed)
Faxed

## 2023-03-22 ENCOUNTER — Encounter: Payer: Self-pay | Admitting: Family Medicine

## 2023-03-22 ENCOUNTER — Ambulatory Visit (INDEPENDENT_AMBULATORY_CARE_PROVIDER_SITE_OTHER): Payer: Medicare HMO | Admitting: Family Medicine

## 2023-03-22 VITALS — BP 132/73 | HR 95 | Ht 65.0 in | Wt 182.1 lb

## 2023-03-22 DIAGNOSIS — F324 Major depressive disorder, single episode, in partial remission: Secondary | ICD-10-CM

## 2023-03-22 DIAGNOSIS — I10 Essential (primary) hypertension: Secondary | ICD-10-CM | POA: Diagnosis not present

## 2023-03-22 DIAGNOSIS — E782 Mixed hyperlipidemia: Secondary | ICD-10-CM | POA: Diagnosis not present

## 2023-03-22 DIAGNOSIS — E1169 Type 2 diabetes mellitus with other specified complication: Secondary | ICD-10-CM | POA: Diagnosis not present

## 2023-03-22 DIAGNOSIS — Z794 Long term (current) use of insulin: Secondary | ICD-10-CM | POA: Diagnosis not present

## 2023-03-22 DIAGNOSIS — K219 Gastro-esophageal reflux disease without esophagitis: Secondary | ICD-10-CM

## 2023-03-22 DIAGNOSIS — M7989 Other specified soft tissue disorders: Secondary | ICD-10-CM | POA: Diagnosis not present

## 2023-03-22 DIAGNOSIS — E890 Postprocedural hypothyroidism: Secondary | ICD-10-CM

## 2023-03-22 NOTE — Patient Instructions (Addendum)
 F/U in 4.5  months, call if you need me sooner  Start wearing compression hose for intermittent leg swelling  Need Covid vaccine  Need Zoster  vaccines   Need Tdap  Thanks for choosing [facility], we consider it a privelige to serve you.

## 2023-03-23 DIAGNOSIS — F321 Major depressive disorder, single episode, moderate: Secondary | ICD-10-CM | POA: Insufficient documentation

## 2023-03-23 DIAGNOSIS — F324 Major depressive disorder, single episode, in partial remission: Secondary | ICD-10-CM | POA: Insufficient documentation

## 2023-03-23 NOTE — Assessment & Plan Note (Signed)
 DASH diet and commitment to daily physical activity for a minimum of 30 minutes discussed and encouraged, as a part of hypertension management. The importance of attaining a healthy weight is also discussed.     03/22/2023    3:03 PM 03/03/2023   10:59 AM 02/28/2023    3:59 PM 12/30/2022    1:37 PM 12/30/2022    1:35 PM 12/16/2022    2:16 PM 11/12/2022    9:19 AM  BP/Weight  Systolic BP 132 124  122 140 119 119  Diastolic BP 73 60  62 81 81 72  Wt. (Lbs) 182.08 184.2 185  177 181   BMI 30.3 kg/m2 30.65 kg/m2 30.79 kg/m2  29.45 kg/m2 30.12 kg/m2      Controlled, no change in medication

## 2023-03-23 NOTE — Assessment & Plan Note (Signed)
 Continue current medication, imporoved

## 2023-03-23 NOTE — Assessment & Plan Note (Addendum)
 Historuically reports intermiuttent swelling in past several weeks. Advised daily use of compression hose, as well as leg elevation when able, has no kidney or heart failure

## 2023-03-23 NOTE — Assessment & Plan Note (Signed)
 Diabetes associated with hypertension, hyperlipidemia, obesity, and poor wound healing  Ms. Rahl is reminded of the importance of commitment to daily physical activity for 30 minutes or more, as able and the need to limit carbohydrate intake to 30 to 60 grams per meal to help with blood sugar control.  Uncontrolled , managed by Endo  The need to take medication as prescribed, test blood sugar as directed, and to call between visits if there is a concern that blood sugar is uncontrolled is also discussed.   Ms. Piltz is reminded of the importance of daily foot exam, annual eye examination, and good blood sugar, blood pressure and cholesterol control.     Latest Ref Rng & Units 03/03/2023   11:54 AM 02/21/2023   10:20 AM 10/11/2022   12:00 AM 09/09/2022   11:29 AM 06/11/2022   11:20 AM  Diabetic Labs  HbA1c 4.0 - 5.6 % 9.5   11.2        Micro/Creat Ratio 0 - 29 mg/g creat     11   Chol 100 - 199 mg/dL  863   797    HDL >60 mg/dL  45   45    Calc LDL 0 - 99 mg/dL  67   875    Triglycerides 0 - 149 mg/dL  864   812    Creatinine 0.57 - 1.00 mg/dL  9.09   9.27       This result is from an external source.      03/22/2023    3:03 PM 03/03/2023   10:59 AM 02/28/2023    3:59 PM 12/30/2022    1:37 PM 12/30/2022    1:35 PM 12/16/2022    2:16 PM 11/12/2022    9:19 AM  BP/Weight  Systolic BP 132 124  122 140 119 119  Diastolic BP 73 60  62 81 81 72  Wt. (Lbs) 182.08 184.2 185  177 181   BMI 30.3 kg/m2 30.65 kg/m2 30.79 kg/m2  29.45 kg/m2 30.12 kg/m2       Latest Ref Rng & Units 10/11/2022    2:00 PM 04/08/2022   12:00 AM  Foot/eye exam completion dates  Eye Exam No Retinopathy  Retinopathy      Foot Form Completion  Done      This result is from an external source.

## 2023-03-23 NOTE — Assessment & Plan Note (Signed)
Managed by Endo 

## 2023-03-23 NOTE — Assessment & Plan Note (Signed)
 Hyperlipidemia:Low fat diet discussed and encouraged.   Lipid Panel  Lab Results  Component Value Date   CHOL 136 02/21/2023   HDL 45 02/21/2023   LDLCALC 67 02/21/2023   TRIG 135 02/21/2023   CHOLHDL 3.0 02/21/2023     Controlled, no change in medication

## 2023-03-23 NOTE — Progress Notes (Signed)
 Jennifer Blackburn     MRN: [id number]      DOB: [birth year]  Chief Complaint  Patient presents with   Follow-up    Feet swelling comes and goes worse this week x 71month needs novolog  refilled    HPI Jennifer Blackburn is here for follow up and re-evaluation of chronic medical conditions, medication management and review of any available recent lab and radiology data.  Preventive health is updated, specifically  Cancer screening and Immunization.   Questions or concerns regarding consultations or procedures which the PT has had in the interim are  addressed. The PT denies any adverse reactions to current medications since the last visit.  1 month h/o intermittent swelling of feet, has compression hose which she does not wear , No kidney or heart failure from medical record review Blood sugar still uncontrolled , but she is working on this ROS Denies recent fever or chills. Denies sinus pressure, nasal congestion, ear pain or sore throat. Denies chest congestion, productive cough or wheezing. Denies chest pains, palpitations PND or orthopnea Denies abdominal pain, nausea, vomiting,diarrhea or constipation.   Denies dysuria, frequency, hesitancy or incontinence. Denies uncontrolled  joint pain, swelling and limitation in mobility. Denies headaches, seizures, numbness, or tingling. Denies depression, anxiety or insomnia. Denies skin break down or rash.   PE  BP 132/73 (BP Location: Right Arm, Patient Position: Sitting, Cuff Size: Large)   Pulse 95   Ht 5' 5 (1.651 m)   Wt 182 lb 1.3 oz (82.6 kg)   LMP 11/11/2016   SpO2 96%   BMI 30.30 kg/m   Patient alert and oriented and in no cardiopulmonary distress.  HEENT: No facial asymmetry, EOMI,     Neck supple .  Chest: Clear to auscultation bilaterally.  CVS: S1, S2 no murmurs, no S3.Regular rate.  ABD: Soft non tender.   Ext: No edema  MS: Adequate ROM spine, shoulders, hips and knees.  Skin: Intact, no ulcerations or rash  noted.  Psych: Good eye contact, normal affect. Memory intact not anxious or depressed appearing.  CNS: CN 2-12 intact, power,  normal throughout.no focal deficits noted.   Assessment & Plan  Leg swelling Historuically reports intermiuttent swelling in past several weeks. Advised daily use of compression hose, as well as leg elevation when able, has no kidney or heart failure  Hyperlipidemia Hyperlipidemia:Low fat diet discussed and encouraged.   Lipid Panel  Lab Results  Component Value Date   CHOL 136 02/21/2023   HDL 45 02/21/2023   LDLCALC 67 02/21/2023   TRIG 135 02/21/2023   CHOLHDL 3.0 02/21/2023     Controlled, no change in medication   Primary hypertension DASH diet and commitment to daily physical activity for a minimum of 30 minutes discussed and encouraged, as a part of hypertension management. The importance of attaining a healthy weight is also discussed.     03/22/2023    3:03 PM 03/03/2023   10:59 AM 02/28/2023    3:59 PM 12/30/2022    1:37 PM 12/30/2022    1:35 PM 12/16/2022    2:16 PM 11/12/2022    9:19 AM  BP/Weight  Systolic BP 132 124  122 140 119 119  Diastolic BP 73 60  62 81 81 72  Wt. (Lbs) 182.08 184.2 185  177 181   BMI 30.3 kg/m2 30.65 kg/m2 30.79 kg/m2  29.45 kg/m2 30.12 kg/m2      Controlled, no change in medication   Postsurgical hypothyroidism Managed by Endo  Type 2 diabetes mellitus with other specified complication (HCC) Diabetes associated with hypertension, hyperlipidemia, obesity, and poor wound healing  Jennifer Blackburn is reminded of the importance of commitment to daily physical activity for 30 minutes or more, as able and the need to limit carbohydrate intake to 30 to 60 grams per meal to help with blood sugar control.  Uncontrolled , managed by Endo  The need to take medication as prescribed, test blood sugar as directed, and to call between visits if there is a concern that blood sugar is uncontrolled is also discussed.    Jennifer Blackburn is reminded of the importance of daily foot exam, annual eye examination, and good blood sugar, blood pressure and cholesterol control.     Latest Ref Rng & Units 03/03/2023   11:54 AM 02/21/2023   10:20 AM 10/11/2022   12:00 AM 09/09/2022   11:29 AM 06/11/2022   11:20 AM  Diabetic Labs  HbA1c 4.0 - 5.6 % 9.5   11.2        Micro/Creat Ratio 0 - 29 mg/g creat     11   Chol 100 - 199 mg/dL  863   797    HDL >60 mg/dL  45   45    Calc LDL 0 - 99 mg/dL  67   875    Triglycerides 0 - 149 mg/dL  864   812    Creatinine 0.57 - 1.00 mg/dL  9.09   9.27       This result is from an external source.      03/22/2023    3:03 PM 03/03/2023   10:59 AM 02/28/2023    3:59 PM 12/30/2022    1:37 PM 12/30/2022    1:35 PM 12/16/2022    2:16 PM 11/12/2022    9:19 AM  BP/Weight  Systolic BP 132 124  122 140 119 119  Diastolic BP 73 60  62 81 81 72  Wt. (Lbs) 182.08 184.2 185  177 181   BMI 30.3 kg/m2 30.65 kg/m2 30.79 kg/m2  29.45 kg/m2 30.12 kg/m2       Latest Ref Rng & Units 10/11/2022    2:00 PM 04/08/2022   12:00 AM  Foot/eye exam completion dates  Eye Exam No Retinopathy  Retinopathy      Foot Form Completion  Done      This result is from an external source.        GERD (gastroesophageal reflux disease) Controlled, no change in medication Managed by  GI  Depression, major, single episode, in partial remission (HCC) Continue current medication, imporoved

## 2023-03-23 NOTE — Assessment & Plan Note (Signed)
Controlled, no change in medication Managed by GI 

## 2023-03-24 ENCOUNTER — Other Ambulatory Visit: Payer: Self-pay | Admitting: Internal Medicine

## 2023-03-24 DIAGNOSIS — E1159 Type 2 diabetes mellitus with other circulatory complications: Secondary | ICD-10-CM

## 2023-03-28 ENCOUNTER — Telehealth: Payer: Self-pay

## 2023-03-28 ENCOUNTER — Other Ambulatory Visit: Payer: Self-pay | Admitting: Internal Medicine

## 2023-03-28 ENCOUNTER — Encounter: Payer: Self-pay | Admitting: Internal Medicine

## 2023-03-28 MED ORDER — NOVOLOG FLEXPEN 100 UNIT/ML ~~LOC~~ SOPN: 20.0000 [IU] | PEN_INJECTOR | Freq: Three times a day (TID) | SUBCUTANEOUS | 2 refills | Status: DC

## 2023-03-28 NOTE — Telephone Encounter (Signed)
 Requested Prescriptions   Signed Prescriptions Disp Refills   insulin  aspart (NOVOLOG  FLEXPEN) 100 UNIT/ML FlexPen 75 mL 2    Sig: Inject 20-25 Units into the skin 3 (three) times daily with meals. Inject 20-25 units wit meals    Authorizing Provider: TRIXIE FILE    Ordering User: CLEOTILDE ROLIN RAMAN

## 2023-03-29 ENCOUNTER — Encounter: Payer: Self-pay | Admitting: Internal Medicine

## 2023-03-30 MED ORDER — INSULIN LISPRO (1 UNIT DIAL) 100 UNIT/ML (KWIKPEN): 22.0000 [IU] | PEN_INJECTOR | Freq: Three times a day (TID) | SUBCUTANEOUS | 1 refills | Status: DC

## 2023-03-30 NOTE — Telephone Encounter (Signed)
 Pt has been notified and I have sent in Humalog per patient request.

## 2023-03-30 NOTE — Telephone Encounter (Signed)
 I called and left a detailed message at [facility] for them to call me back. They are currently closed for lunch until 2pm

## 2023-03-31 NOTE — Telephone Encounter (Signed)
 Received a message from the pharmacy and her insurance is not covering the Novolog so we switched to Humalog (preferred by the insurance)

## 2023-04-04 ENCOUNTER — Ambulatory Visit (HOSPITAL_COMMUNITY): Payer: Medicare HMO | Admitting: Psychiatry

## 2023-04-04 DIAGNOSIS — F439 Reaction to severe stress, unspecified: Secondary | ICD-10-CM | POA: Diagnosis not present

## 2023-04-04 DIAGNOSIS — Z630 Problems in relationship with spouse or partner: Secondary | ICD-10-CM

## 2023-04-04 NOTE — Progress Notes (Signed)
 IN-PERSON  THERAPIST PROGRESS NOTE  Session Time:  Monday 04/04/2023 2:10 PM -  2:54 PM       Participation Level: Active  Behavioral Response: CasualAlertAnxious  Type of Therapy: Individual Therapy  Treatment Goals addressed: Verbalize an understanding and resolution of current interpersonal problems Learn and implement conflict resolution skills to resolve interpersonal problems   ProgressTowards Goals: Progressing  Interventions: Supportive/CBT  Summary: Jennifer Blackburn is a 78 y.o. female who is referred for services by PCP Dr. Rollene Pesa due to pt experiencing stress. She denies any psychiatric hospitalizations. She is a returning pt to this clinician and last was seen in 2016.  Patient is resuming services as she reports increased stress triggered by health issues, increased fatigue, and marital issues.  Patient's current symptoms include fatigue, worry, and muscle tension.  Patient last was seen about 2 weeks  ago.  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 She has been trying to identify ways to focus more on self-care and identifying her interests. She has enrolled in a chair exercise program and is considering attending this week. She also is starting to think of ways to decrease her activity regarding taking taking care of most of the household tasks. 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 Pt also expresses a desire to be more sociable but reports some difficulty doing this due to sometimes assuming people may have negative thoughts about her. She also reports sometimes having difficulty initiating conversation when new to a group.   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  Diagnosis: Stress due to marital problems  Collaboration of Care: Primary Care Provider AEB patient sees PCP Dr. Rollene Pesa  Patient/Guardian was advised Release of Information must be obtained prior to any record release in order to collaborate their care with an outside provider. Patient/Guardian was advised if they have not already done so to contact the registration department to sign all necessary forms in order for us  to release information regarding their care.   Consent: Patient/Guardian gives verbal consent for treatment and assignment of benefits for services provided during this visit. Patient/Guardian expressed understanding and agreed to proceed.   Winton FORBES Rubinstein, LCSW 04/04/2023

## 2023-04-08 ENCOUNTER — Telehealth: Payer: Self-pay

## 2023-04-08 ENCOUNTER — Ambulatory Visit: Payer: Medicare HMO | Admitting: Family Medicine

## 2023-04-08 NOTE — Telephone Encounter (Signed)
Patient Assistance  Medication: Ozempic  Quantity:4 boxes  Date received:04/07/23  Dicie Beam

## 2023-04-09 DIAGNOSIS — E1165 Type 2 diabetes mellitus with hyperglycemia: Secondary | ICD-10-CM | POA: Diagnosis not present

## 2023-04-11 ENCOUNTER — Encounter: Payer: Self-pay | Admitting: Family Medicine

## 2023-04-12 ENCOUNTER — Other Ambulatory Visit: Payer: Self-pay

## 2023-04-12 ENCOUNTER — Encounter: Payer: Self-pay | Admitting: Internal Medicine

## 2023-04-12 MED ORDER — METFORMIN HCL 1000 MG PO TABS: 1000.0000 mg | ORAL_TABLET | Freq: Two times a day (BID) | ORAL | 1 refills | Status: DC

## 2023-04-12 NOTE — Telephone Encounter (Signed)
Patient came in to office today and picked up 4 boxes of patient assistance Ozempic.

## 2023-05-03 NOTE — Progress Notes (Unsigned)
GI Office Note    Referring Provider: Kerri Perches, MD Primary Care Physician:  Kerri Perches, MD  Primary Gastroenterologist: Roetta Sessions, MD   Chief Complaint   No chief complaint on file.   History of Present Illness   Jennifer Blackburn is a 78 y.o. female presenting today for follow up. Last seen 06/2022 for GERD, constipation.      CT A/P with contrast 05/2022: IMPRESSION: 1. Nondilated fluid-filled small bowel and right colon which might indicate enteritis/diarrhea. But no bowel obstruction or convincing bowel inflammation. Underlying large bowel diverticulosis. Normal appendix. 2. Cholelithiasis without CT evidence of acute cholecystitis. 3. Bilateral nephrolithiasis without evidence of obstructive uropathy. 4. Chronic hepatic steatosis. 5.  Aortic Atherosclerosis (ICD10-I70.0).       Medications   Current Outpatient Medications  Medication Sig Dispense Refill   ACCU-CHEK GUIDE test strip USE AS INSTRUCTED TO CHECK BLOOD SUGAR 2 TIMES PER DAY DX CODE E11.65 100 strip 3   Cetirizine HCl 10 MG TBDP Take one tablet by mouth two times daily for allergy (Patient taking differently: Take 1 tablet by mouth daily.) 60 tablet 5   Cholecalciferol (VITAMIN D3) 50 MCG (2000 UT) TABS Take 2,000 Units by mouth daily.     Continuous Glucose Receiver (DEXCOM G7 RECEIVER) DEVI Use to monitor blood sugar daily dx e11.59 1 each 1   Continuous Glucose Sensor (DEXCOM G7 SENSOR) MISC Use to monitor blood sugar daily Dx: E11.59 1 each 5   ezetimibe (ZETIA) 10 MG tablet Take 1 tablet (10 mg total) by mouth daily. 30 tablet 5   gabapentin (NEURONTIN) 100 MG capsule TAKE ONE CAPSULE TWICE DAILY AND THREE AT BEDTIME 150 capsule 1   hydrOXYzine (ATARAX) 10 MG tablet Take one tablet twice daily and two tablets at bedtime as needed for generalized itching. 360 tablet 1   imipramine (TOFRANIL) 50 MG tablet Take 1 tablet (50 mg total) by mouth at bedtime. 90 tablet 3    insulin aspart (NOVOLOG FLEXPEN) 100 UNIT/ML FlexPen Inject 20-25 Units into the skin 3 (three) times daily with meals. Inject 20-25 units wit meals 75 mL 2   insulin glargine, 1 Unit Dial, (TOUJEO SOLOSTAR) 300 UNIT/ML Solostar Pen INJECT 30-50 UNITS SUBCUTANEOUSLY TWO TIMES DAILY 18 mL 3   insulin lispro (HUMALOG KWIKPEN) 100 UNIT/ML KwikPen Inject 22 Units into the skin 3 (three) times daily. 15 mL 1   Insulin Pen Needle 32G X 4 MM MISC Use 4x a day 300 each 3   isosorbide mononitrate (IMDUR) 30 MG 24 hr tablet TAKE HALF A TABLET BY MOUTH DAILY 45 tablet 3   Lancets (ACCU-CHEK MULTICLIX) lancets Use as instructed three times daily dx 250.01 100 each 5   levothyroxine (SYNTHROID) 112 MCG tablet TAKE 1 TABLET BY MOUTH EVERY DAY BEFORE BREAKFAST 90 tablet 3   metFORMIN (GLUCOPHAGE) 1000 MG tablet Take 1 tablet (1,000 mg total) by mouth 2 (two) times daily. 180 tablet 1   metoprolol tartrate (LOPRESSOR) 50 MG tablet TAKE 1 TABLET BY MOUTH TWICE A DAY 180 tablet 2   pantoprazole (PROTONIX) 40 MG tablet TAKE 1 TABLET BY MOUTH EVERY DAY 90 tablet 1   PARoxetine (PAXIL) 20 MG tablet TAKE 1 TABLET EVERY DAY 90 tablet 3   pravastatin (PRAVACHOL) 80 MG tablet TAKE 1 TABLET EVERY DAY 90 tablet 3   Semaglutide, 2 MG/DOSE, 8 MG/3ML SOPN Inject 2 mg as directed once a week. 9 mL 2   No current facility-administered medications  for this visit.    Allergies   Allergies as of 05/04/2023 - Review Complete 03/22/2023  Allergen Reaction Noted   Lipitor [atorvastatin calcium] Other (See Comments) 02/07/2019   Actos [pioglitazone] Other (See Comments) 02/03/2021   Crestor [rosuvastatin] Other (See Comments) 02/03/2021   Daypro [oxaprozin] Hives 08/23/2016   Nsaids Hives 04/14/2007   Sulfonamide derivatives Hives 04/14/2007     Past Medical History   Past Medical History:  Diagnosis Date   Anxiety    Arthritis    CAD (coronary artery disease)    Depression    Diabetes mellitus type II    without  complication   DJD (degenerative joint disease) of lumbar spine    Hypercholesteremia    Hyperlipidemia    Hypertension    benign    Hypothyroidism    Thyroid cancer (HCC) 2001    Past Surgical History   Past Surgical History:  Procedure Laterality Date   ABDOMINAL HYSTERECTOMY     CARDIAC CATHETERIZATION     CATARACT EXTRACTION W/PHACO Right 07/17/2012   Procedure: CATARACT EXTRACTION PHACO AND INTRAOCULAR LENS PLACEMENT (IOC);  Surgeon: Gemma Payor, MD;  Location: [facility];  Service: Ophthalmology;  Laterality: Right;  CDE:25.51   CATARACT EXTRACTION W/PHACO Left 09/17/2016   Procedure: CATARACT EXTRACTION PHACO AND INTRAOCULAR LENS PLACEMENT LEFT EYE;  Surgeon: Gemma Payor, MD;  Location: [facility];  Service: Ophthalmology;  Laterality: Left;  CDE: 19.23   COLONOSCOPY     COLONOSCOPY N/A 01/15/2014   Procedure: COLONOSCOPY;  Surgeon: Corbin Ade, MD;  Location: [facility];  Service: Endoscopy;  Laterality: N/A;  9:00 AM   COLONOSCOPY WITH PROPOFOL N/A 01/25/2019   Procedure: COLONOSCOPY WITH PROPOFOL;  Surgeon: Corbin Ade, MD; diverticulosis in the entire colon, otherwise normal exam.  No repeat colonoscopy due to age.   DOPPLER ECHOCARDIOGRAPHY     ESOPHAGOGASTRODUODENOSCOPY (EGD) WITH PROPOFOL N/A 01/25/2019   Procedure: ESOPHAGOGASTRODUODENOSCOPY (EGD) WITH PROPOFOL;  Surgeon: Corbin Ade, MD; normal esophagus without dilation due to inability to pass dilator beyond the hypopharynx, 1 small nonbleeding gastric ulcer s/p biopsied, otherwise normal exam.  Pathology with ulcer with reactive changes, no H. pylori, metaplasia, dysplasia, or malignancy.   ESOPHAGOGASTRODUODENOSCOPY (EGD) WITH PROPOFOL N/A 08/21/2019   Procedure: ESOPHAGOGASTRODUODENOSCOPY (EGD) WITH PROPOFOL;  Surgeon: Corbin Ade, MD; Normal esophagus. Previously noted gastric ulcer completely healed. Normal examined duodenum.    JOINT REPLACEMENT  07/01/2010   left hip   KNEE SURGERY Right    arthroscopy    left hip replaced  07/01/2010   SPINE SURGERY  2006   cervical   stress dipyridamole myocardial perfusion     THYROIDECTOMY     TONSILLECTOMY     VESICOVAGINAL FISTULA CLOSURE W/ TAH      Past Family History   Family History  Problem Relation Age of Onset   Heart attack Father    Heart failure Mother    Asthma Daughter    Sleep apnea Son        CPAP   Colon cancer Neg Hx     Past Social History   Social History   Socioeconomic History   Marital status: Married    Spouse name: Not on file   Number of children: 2   Years of education: 14   Highest education level: Some college, no degree  Occupational History   Occupation: retired     Comment: bank  Tobacco Use   Smoking status: Never    Passive exposure: Yes  Smokeless tobacco: Never   Tobacco comments:    Husband smokes in the home  Vaping Use   Vaping status: Never Used  Substance and Sexual Activity   Alcohol use: No   Drug use: No   Sexual activity: Not Currently  Other Topics Concern   Not on file  Social History Narrative   Lives with husband, at home   No caffeine   Social Drivers of Corporate investment banker Strain: Low Risk  (06/10/2022)   Overall Financial Resource Strain (CARDIA)    Difficulty of Paying Living Expenses: Not hard at all  Food Insecurity: No Food Insecurity (06/10/2022)   Hunger Vital Sign    Worried About Running Out of Food in the Last Year: Never true    Ran Out of Food in the Last Year: Never true  Transportation Needs: No Transportation Needs (06/10/2022)   PRAPARE - Administrator, Civil Service (Medical): No    Lack of Transportation (Non-Medical): No  Physical Activity: Insufficiently Active (09/13/2022)   Exercise Vital Sign    Days of Exercise per Week: 3 days    Minutes of Exercise per Session: 20 min  Stress: No Stress Concern Present (06/10/2022)   [employer] of Occupational Health - Occupational Stress Questionnaire    Feeling of Stress : Not  at all  Social Connections: Socially Integrated (06/10/2022)   Social Connection and Isolation Panel [NHANES]    Frequency of Communication with Friends and Family: More than three times a week    Frequency of Social Gatherings with Friends and Family: Twice a week    Attends Religious Services: More than 4 times per year    Active Member of [employer] or Organizations: Yes    Attends Engineer, structural: More than 4 times per year    Marital Status: Married  Catering manager Violence: Not At Risk (08/31/2021)   Humiliation, Afraid, Rape, and Kick questionnaire    Fear of Current or Ex-Partner: No    Emotionally Abused: No    Physically Abused: No    Sexually Abused: No    Review of Systems   General: Negative for anorexia, weight loss, fever, chills, fatigue, weakness. ENT: Negative for hoarseness, difficulty swallowing , nasal congestion. CV: Negative for chest pain, angina, palpitations, dyspnea on exertion, peripheral edema.  Respiratory: Negative for dyspnea at rest, dyspnea on exertion, cough, sputum, wheezing.  GI: See history of present illness. GU:  Negative for dysuria, hematuria, urinary incontinence, urinary frequency, nocturnal urination.  Endo: Negative for unusual weight change.     Physical Exam   LMP 11/11/2016    General: Well-nourished, well-developed in no acute distress.  Eyes: No icterus. Mouth: Oropharyngeal mucosa moist and pink , no lesions erythema or exudate. Lungs: Clear to auscultation bilaterally.  Heart: Regular rate and rhythm, no murmurs rubs or gallops.  Abdomen: Bowel sounds are normal, nontender, nondistended, no hepatosplenomegaly or masses,  no abdominal bruits or hernia , no rebound or guarding.  Rectal: ***  Extremities: No lower extremity edema. No clubbing or deformities. Neuro: Alert and oriented x 4   Skin: Warm and dry, no jaundice.   Psych: Alert and cooperative, normal mood and affect.  Labs   Lab Results  Component  Value Date   HGBA1C 9.5 (A) 03/03/2023   Lab Results  Component Value Date   TSH 0.10 (L) 03/03/2023   Lab Results  Component Value Date   ALT 14 02/21/2023   AST 16 02/21/2023  ALKPHOS 53 02/21/2023   BILITOT 0.2 02/21/2023   Lab Results  Component Value Date   NA 137 02/21/2023   CL 98 02/21/2023   K 4.8 02/21/2023   CO2 24 02/21/2023   BUN 16 02/21/2023   CREATININE 0.90 02/21/2023   EGFR 65 02/21/2023   CALCIUM 9.3 02/21/2023   ALBUMIN 4.0 02/21/2023   GLUCOSE 219 (H) 02/21/2023    Imaging Studies   No results found.  Assessment       PLAN   ***   Leanna Battles. Melvyn Neth, MHS, PA-C [facility]

## 2023-05-04 ENCOUNTER — Encounter: Payer: Self-pay | Admitting: Gastroenterology

## 2023-05-04 ENCOUNTER — Ambulatory Visit: Payer: Medicare HMO | Admitting: Gastroenterology

## 2023-05-04 VITALS — BP 130/68 | HR 74 | Temp 98.1°F | Ht 65.0 in | Wt 181.0 lb

## 2023-05-04 DIAGNOSIS — R1010 Upper abdominal pain, unspecified: Secondary | ICD-10-CM | POA: Diagnosis not present

## 2023-05-04 DIAGNOSIS — K5900 Constipation, unspecified: Secondary | ICD-10-CM

## 2023-05-04 DIAGNOSIS — R109 Unspecified abdominal pain: Secondary | ICD-10-CM | POA: Insufficient documentation

## 2023-05-04 DIAGNOSIS — K219 Gastro-esophageal reflux disease without esophagitis: Secondary | ICD-10-CM

## 2023-05-04 NOTE — Patient Instructions (Signed)
Complete labs at your convenience. We will be in touch with results.  Try avoiding fatty, greasy, spicy foods that are harder to digest. Also raw vegetables and fruits are harder to digest and will tend to stay in your stomach longer. This will worsen your gas. Try eating cooked fruits/vegetables instead.  If your labs are unremarkable and you continue to have abdominal pain, we may consider CT scan of your abdomen. Continue miralax 1/2 capful daily, if you need to go up to twice daily you can. Adjust dose in order to have soft bowel movement every day or every other day.

## 2023-05-05 ENCOUNTER — Other Ambulatory Visit: Payer: Self-pay | Admitting: Family Medicine

## 2023-05-05 LAB — CBC WITH DIFFERENTIAL/PLATELET
Basophils Absolute: 0 10*3/uL (ref 0.0–0.2)
Basos: 1 %
EOS (ABSOLUTE): 0.3 10*3/uL (ref 0.0–0.4)
Eos: 5 %
Hematocrit: 38.9 % (ref 34.0–46.6)
Hemoglobin: 12.1 g/dL (ref 11.1–15.9)
Immature Grans (Abs): 0 10*3/uL (ref 0.0–0.1)
Immature Granulocytes: 0 %
Lymphocytes Absolute: 2.1 10*3/uL (ref 0.7–3.1)
Lymphs: 37 %
MCH: 26.3 pg — ABNORMAL LOW (ref 26.6–33.0)
MCHC: 31.1 g/dL — ABNORMAL LOW (ref 31.5–35.7)
MCV: 85 fL (ref 79–97)
Monocytes Absolute: 0.4 10*3/uL (ref 0.1–0.9)
Monocytes: 7 %
Neutrophils Absolute: 3 10*3/uL (ref 1.4–7.0)
Neutrophils: 50 %
Platelets: 252 10*3/uL (ref 150–450)
RBC: 4.6 x10E6/uL (ref 3.77–5.28)
RDW: 12.7 % (ref 11.7–15.4)
WBC: 5.9 10*3/uL (ref 3.4–10.8)

## 2023-05-05 LAB — COMPREHENSIVE METABOLIC PANEL
ALT: 12 [IU]/L (ref 0–32)
AST: 15 [IU]/L (ref 0–40)
Albumin: 4.2 g/dL (ref 3.8–4.8)
Alkaline Phosphatase: 48 [IU]/L (ref 44–121)
BUN/Creatinine Ratio: 14 (ref 12–28)
BUN: 12 mg/dL (ref 8–27)
Bilirubin Total: 0.3 mg/dL (ref 0.0–1.2)
CO2: 26 mmol/L (ref 20–29)
Calcium: 9 mg/dL (ref 8.7–10.3)
Chloride: 97 mmol/L (ref 96–106)
Creatinine, Ser: 0.85 mg/dL (ref 0.57–1.00)
Globulin, Total: 2.9 g/dL (ref 1.5–4.5)
Glucose: 171 mg/dL — ABNORMAL HIGH (ref 70–99)
Potassium: 4.7 mmol/L (ref 3.5–5.2)
Sodium: 137 mmol/L (ref 134–144)
Total Protein: 7.1 g/dL (ref 6.0–8.5)
eGFR: 70 mL/min/{1.73_m2} (ref >59)

## 2023-05-05 LAB — LIPASE: Lipase: 25 U/L (ref 14–85)

## 2023-05-09 ENCOUNTER — Ambulatory Visit (INDEPENDENT_AMBULATORY_CARE_PROVIDER_SITE_OTHER): Payer: Medicare HMO | Admitting: Psychiatry

## 2023-05-09 DIAGNOSIS — Z630 Problems in relationship with spouse or partner: Secondary | ICD-10-CM | POA: Diagnosis not present

## 2023-05-09 DIAGNOSIS — F439 Reaction to severe stress, unspecified: Secondary | ICD-10-CM | POA: Diagnosis not present

## 2023-05-09 DIAGNOSIS — E1165 Type 2 diabetes mellitus with hyperglycemia: Secondary | ICD-10-CM | POA: Diagnosis not present

## 2023-05-09 NOTE — Progress Notes (Signed)
IN-PERSON  THERAPIST PROGRESS NOTE  Session Time:  Monday 05/09/2023 1:10 PM  - 2:00 PM  Participation Level: Active  Behavioral Response: CasualAlertAnxious  Type of Therapy: Individual Therapy  Treatment Goals addressed: Verbalize an understanding and resolution of current interpersonal problems Learn and implement conflict resolution skills to resolve interpersonal problems   ProgressTowards Goals: Progressing  Interventions: Supportive/CBT  Summary: Jennifer Blackburn is a 78 y.o. female who is referred for services by PCP Dr. Syliva Overman due to pt experiencing stress. She denies any psychiatric hospitalizations. She is a returning pt to this clinician and last was seen in 2016.  Patient is resuming services as she reports increased stress triggered by health issues, increased fatigue, and marital issues.  Patient's current symptoms include fatigue, worry, and muscle tension.  Patient last was seen about 4  weeks  ago.  She reports continued stress regarding marital issues but reports experiencing some relief for the past 2 weeks as husband has not been as argumentative due to being sick. But there was one occasion in which he made a derogatory statement to pt. She reports being very hurt by this. Per her report, she did not say anything to husband about his behavior as "he has to have the last word". She reports a pattern of sometimes saying something when frustrated but admits her communication is more passive. She is pleased with her recent efforts to become more involved in activities for self such as attending the chair exercise group. She reports enjoying this very much. She also is considering joining a seniors group where members go on trips. Pt also is pleased with her efforts to have more realistic expectations of self regarding household tasks. She now allows herself to take a break and enjoy reading or listening to music. Suicidal/Homicidal: Nowithout intent/plan  Therapist  Response: Reviewed symptoms, discussed stressors, facilitated expression of thoughts and feelings, validated feelings, assisted pt examine her pattern of communication with husband, began to discuss assertive versus passive communication, continued to discuss basic personal rights and provided pt with copy to review, also began to identify ways to improve assertive communication with the use of I messages, praised and reinforced pt's efforts to have realistic expectations of self and allow self to take a break to pursue pleasurable activities, discussed effects, praised and reinforced pt's efforts to  attend chair group, encouraged pt to follow through with her plan to join senior group.  Plan: Return again in 2 weeks.  Diagnosis: Stress due to marital problems  Collaboration of Care: Primary Care Provider AEB patient sees PCP Dr. Syliva Overman  Patient/Guardian was advised Release of Information must be obtained prior to any record release in order to collaborate their care with an outside provider. Patient/Guardian was advised if they have not already done so to contact the registration department to sign all necessary forms in order for [place] to release information regarding their care.   Consent: Patient/Guardian gives verbal consent for treatment and assignment of benefits for services provided during this visit. Patient/Guardian expressed understanding and agreed to proceed.   Adah Salvage, LCSW 05/09/2023

## 2023-05-12 ENCOUNTER — Telehealth: Payer: Self-pay | Admitting: Gastroenterology

## 2023-05-12 NOTE — Telephone Encounter (Signed)
Pt was calling about lab results. Please call [phone]

## 2023-05-13 NOTE — Telephone Encounter (Signed)
 See result note.

## 2023-05-23 ENCOUNTER — Ambulatory Visit (HOSPITAL_COMMUNITY): Payer: Medicare HMO | Admitting: Psychiatry

## 2023-06-02 ENCOUNTER — Other Ambulatory Visit: Payer: Self-pay | Admitting: Family Medicine

## 2023-06-08 ENCOUNTER — Other Ambulatory Visit: Payer: Self-pay | Admitting: Internal Medicine

## 2023-06-08 DIAGNOSIS — E1159 Type 2 diabetes mellitus with other circulatory complications: Secondary | ICD-10-CM

## 2023-06-08 DIAGNOSIS — E1165 Type 2 diabetes mellitus with hyperglycemia: Secondary | ICD-10-CM | POA: Diagnosis not present

## 2023-06-13 ENCOUNTER — Ambulatory Visit: Payer: Medicare HMO | Admitting: Internal Medicine

## 2023-06-13 DIAGNOSIS — L680 Hirsutism: Secondary | ICD-10-CM | POA: Diagnosis not present

## 2023-06-13 DIAGNOSIS — L821 Other seborrheic keratosis: Secondary | ICD-10-CM | POA: Diagnosis not present

## 2023-06-13 DIAGNOSIS — L720 Epidermal cyst: Secondary | ICD-10-CM | POA: Diagnosis not present

## 2023-06-29 ENCOUNTER — Telehealth: Payer: Self-pay

## 2023-06-29 NOTE — Telephone Encounter (Signed)
 Patient was identified as falling into the [place] Measure - Diabetes.   Patient was: Appointment already scheduled for:  08/03/23.

## 2023-06-30 ENCOUNTER — Ambulatory Visit: Admitting: Internal Medicine

## 2023-06-30 ENCOUNTER — Encounter: Payer: Self-pay | Admitting: Internal Medicine

## 2023-06-30 VITALS — BP 120/70 | HR 71 | Ht 65.0 in | Wt 181.4 lb

## 2023-06-30 DIAGNOSIS — E890 Postprocedural hypothyroidism: Secondary | ICD-10-CM | POA: Diagnosis not present

## 2023-06-30 DIAGNOSIS — Z794 Long term (current) use of insulin: Secondary | ICD-10-CM

## 2023-06-30 DIAGNOSIS — Z7985 Long-term (current) use of injectable non-insulin antidiabetic drugs: Secondary | ICD-10-CM

## 2023-06-30 DIAGNOSIS — Z7984 Long term (current) use of oral hypoglycemic drugs: Secondary | ICD-10-CM | POA: Diagnosis not present

## 2023-06-30 DIAGNOSIS — E1159 Type 2 diabetes mellitus with other circulatory complications: Secondary | ICD-10-CM | POA: Diagnosis not present

## 2023-06-30 DIAGNOSIS — C73 Malignant neoplasm of thyroid gland: Secondary | ICD-10-CM

## 2023-06-30 LAB — POCT GLYCOSYLATED HEMOGLOBIN (HGB A1C): Hemoglobin A1C: 8.8 % — AB (ref 4.0–5.6)

## 2023-06-30 NOTE — Progress Notes (Signed)
 Patient ID: Jennifer Blackburn, female   DOB: 09-May-1945, 78 y.o.   MRN: [id number]   HPI: Jennifer Blackburn is a 78 y.o.-year-old female, returning for follow-up for DM2, dx in 2001, insulin-dependent since 2017, uncontrolled, with long-term complications (DR, CAD) and also papillary thyroid cancer and postsurgical hypothyroidism.  Last visit 4 months ago.  Interim history: No increased urination, blurry vision, chest pain. She stopped lemonade before last visit. She was not feeling well last week after taking the Covid vaccine >> relaxed diet.  The rest have been higher.  She also mentions missing some insulin doses.  DM2: Reviewed HbA1c levels: Lab Results  Component Value Date   HGBA1C 9.5 (A) 03/03/2023   HGBA1C 11.2 10/11/2022   HGBA1C 9.4 (A) 06/08/2022   HGBA1C 9.1 (A) 12/31/2021   HGBA1C 8.8 (A) 08/27/2021   HGBA1C 8.8 (A) 04/28/2021   HGBA1C 10.1 (A) 12/23/2020   HGBA1C 9.0 (A) 08/07/2020   HGBA1C 10.5 (A) 05/09/2020   HGBA1C 9.7 (A) 01/04/2020  01/12/2018: HbA1c 8.7% 08/05/2017: HbA1c 8.9% 07/11/2017: HbA1c 9.1%  Pt is on a regimen of: - Metformin 1000 mg 2x a day with meals. - Toujeo 30 units in a.m. and 50 units at bedtime >> 30 units in a.m. and 40 units at bedtime (misses the dose at night) - NovoLog 10 -15 units before/after each meal >> not consistently >> 20-25 >> 18-20 units before meals - Ozempic 0.5 ...>> 2 mg weekly (through PAP) >> stopped >> restarted She was on Ozempic 0.5 mg weekly in a.m. >> 1 mg  - constipation >> stopped 06/2018.  We added it back 09/2018. She was on Victoza >> $$$. She was on Jardiance >> $$$. She was on Actos >> swelling - stopped Spring 2019. She was on Glipizide XL 5 mg daily >> stopped 07/2017.  We retried glipizide 5 mg twice a day but had to stop 12/2018 due to lack of effect.  Pt checks her sugars >4x times a day:  Prev.: - am:189-210 - 2h after b'fast:  - lunch: 200s - 2h after lunch: n/c - dinner: n/c - 2h after dinner:  190-201 - bedtime: n/c  Prev.:   Lowest sugar was 90s >> 110 >> 58 >> 100; she has hypoglycemia awareness in the 60s. Highest sugar was 200s >> 200s >> 300s >> 300s.  Glucometer: AccuChek Aviva  Pt's meals are: - Breakfast:toast, cereal, milk; oatmeal; bacon + egg >> oatmeal + coffee >> cereals - Lunch: skips >> 2 eggs, fruit - Dinner: hamburger, or chicken, or fish, + veggies, bread, water and tea - Snacks: 2- fruit She was seeing a dietitian in [place].  -No CKD, last BUN/creatinine:  Lab Results  Component Value Date   BUN 12 05/04/2023   BUN 16 02/21/2023   CREATININE 0.85 05/04/2023   CREATININE 0.90 02/21/2023   Lab Results  Component Value Date   MICRALBCREAT 11 06/11/2022   MICRALBCREAT 12 04/21/2021   MICRALBCREAT 13 01/28/2021   MICRALBCREAT 11.5 06/14/2017   MICRALBCREAT 20.8 07/10/2014   MICRALBCREAT 10.4 05/02/2013   MICRALBCREAT 5.3 04/06/2012   MICRALBCREAT 3.7 12/21/2010   MICRALBCREAT 29.8 12/13/2009   MICRALBCREAT 7.5 10/17/2008  On quinapril.  -+ HL; last set of lipids: Lab Results  Component Value Date   CHOL 136 02/21/2023   HDL 45 02/21/2023   LDLCALC 67 02/21/2023   TRIG 135 02/21/2023   CHOLHDL 3.0 02/21/2023  On pravastatin 80.  - last eye exam was 04/08/2022: + DR . Dr.  Carter in [place].  She has cataracts OU.   -No numbness and tingling in her feet. Has a podiatrist in [place].  Last foot exam 10/11/2022.  On ASA 81.  Pt has no FH of DM.  H/o Thyroid cancer (2001), s/p RAI treatment, now with postsurgical hypothyroidism.  Previously on brand-name Synthroid, now generic levothyroxine.  Pt is on levothyroxine 112 mcg daily (dose decreased 02/2023), taken: - in am - fasting - at least 30 min from b'fast - no calcium - no iron - no multivitamins - + PPIs (Protonix) moved to pm - not on Biotin  Reviewed her TFTs: Lab Results  Component Value Date   TSH 0.10 (L) 03/03/2023   TSH 0.63 10/11/2022   TSH 0.72  12/31/2021   TSH 0.06 (L) 08/27/2021   TSH 0.40 08/07/2020   TSH 1.60 05/09/2020   TSH 0.05 (L) 08/31/2019   TSH 0.04 (L) 12/28/2018   TSH 0.06 (L) 09/21/2018   TSH 0.61 05/26/2018   TSH 2.49 03/23/2016   TSH 0.070 (L) 10/13/2010   TSH 0.035 (L) 09/22/2006   TSH 0.015 (L) 06/17/2006  11/03/2017: TSH 0.044  Her thyroglobulin levels are detectable: Lab Results  Component Value Date   THYROGLB 0.3 (L) 03/03/2023   THYROGLB 0.5 (L) 10/11/2022   THYROGLB 0.3 (L) 08/27/2021   THYROGLB 0.4 (L) 08/07/2020   THYROGLB 1.2 (L) 05/09/2020   THYROGLB 0.4 (L) 08/31/2019   THYROGLB 0.3 (L) 09/21/2018   THYROGLB 0.7 (L) 05/26/2018   Lab Results  Component Value Date   THGAB <1 03/03/2023   THGAB <1 10/11/2022   THGAB <1 08/27/2021   THGAB <1 08/07/2020   THGAB <1 05/09/2020   THGAB <1 08/31/2019   THGAB <1 09/21/2018   THGAB <1 05/26/2018   Reviewed records from Dr. Patrecia Pace: Patient is status post total thyroidectomy in 2004 1.3 cm papillary thyroid cancer of the right lobe, followed by 3x RAI tx's with  33 mCi I-131 and a fourth RAI dose inpatient: 125 mCi, at [facility].    Whole-body scan on 09/08/2007 showed an increase in the thyroid bed activity and the follow up study with PET scan 10/08/2007 showed mildly hypermetabolic cervical lymph nodes.    Repeat PET 05/10/2008 showed no significant changes felt likely to be due to to a reactive process.  11/03/2017: TSH 0.044, thyroglobulin 0.2, ATA <1.0  04/05/2017: TSH 0.021 (0.45-4.5), thyroglobulin 0.3 (by IMA), ATA <1.0  Reviewed imaging test reports in the system: Thyroid ultrasound (06/10/2018): 1.1 cm nonspecific calcified lesion in the right upper neck.  CT was recommended: There is no residual or recurrent tissue in the right or left thyroid beds. There is no evidence of abnormal adenopathy by short axis diameter measurement criteria There is a nonspecific calcified soft tissue mass measuring 1.1 cm in the right superior neck. This is  of unknown significance. CT neck can be performed to further characterize.  CT (10/18/2018): Status post thyroidectomy. No soft tissue mass within the thyroidectomy bed. A 13 x 7 mm ovoid focus of calcification in the right aspect of the thyroidectomy bed likely corresponding with the finding on recent neck ultrasound and is unchanged as compared to neck CT 10/24/2007, benign.   No pathologically enlarged cervical chain lymph nodes. A nonenlarged calcified right supraclavicular lymph node is new from prior neck CT 10/24/2007 but also favored benign. Attention recommended on follow-up.  Neck U/S (06/03/2020): Ultrasound of the neck demonstrates no evidence of visible residual thyroid tissue or abnormal soft tissue mass.  Ovoid area of densely shadowing calcification again noted to the right of midline as seen by prior ultrasound and also demonstrated by prior CT studies of the neck with documented stability between 2009 and 2020 studies.   Small left cervical lymph node has a similar appearance to the prior ultrasound study measuring 0.5 cm in short axis.   IMPRESSION: No evidence by ultrasound of recurrent thyroid malignancy in the neck.  Stable right neck calcification which has been documented to be benign and stable by prior CT.   Small left cervical lymph node demonstrates stable appearance and size by ultrasound  compared to the 2020 ultrasound.  Pt denies: - feeling nodules in neck - hoarseness - dysphagia - choking  She also has a history of HTN and anemia. She had an episode of gastroenteritis 05/28/2022. scan: diverticulitis, GB stones and kidney stones.  Lipase was normal.  ROS: + See HPI  I reviewed pt's medications, allergies, PMH, social hx, family hx, and changes were documented in the history of present illness. Otherwise, unchanged from my initial visit note.  Past Medical History:  Diagnosis Date   Anxiety    Arthritis    CAD (coronary artery disease)     Depression    Diabetes mellitus type II    without complication   DJD (degenerative joint disease) of lumbar spine    Hypercholesteremia    Hyperlipidemia    Hypertension    benign    Hypothyroidism    Thyroid cancer (HCC) 2001   Past Surgical History:  Procedure Laterality Date   ABDOMINAL HYSTERECTOMY     CARDIAC CATHETERIZATION     CATARACT EXTRACTION W/PHACO Right 07/17/2012   Procedure: CATARACT EXTRACTION PHACO AND INTRAOCULAR LENS PLACEMENT (IOC);  Surgeon: Gemma Payor, MD;  Location: [facility];  Service: Ophthalmology;  Laterality: Right;  CDE:25.51   CATARACT EXTRACTION W/PHACO Left 09/17/2016   Procedure: CATARACT EXTRACTION PHACO AND INTRAOCULAR LENS PLACEMENT LEFT EYE;  Surgeon: Gemma Payor, MD;  Location: [facility];  Service: Ophthalmology;  Laterality: Left;  CDE: 19.23   COLONOSCOPY     COLONOSCOPY N/A 01/15/2014   Procedure: COLONOSCOPY;  Surgeon: Corbin Ade, MD;  Location: [facility];  Service: Endoscopy;  Laterality: N/A;  9:00 AM   COLONOSCOPY WITH PROPOFOL N/A 01/25/2019   Procedure: COLONOSCOPY WITH PROPOFOL;  Surgeon: Corbin Ade, MD; diverticulosis in the entire colon, otherwise normal exam.  No repeat colonoscopy due to age.   DOPPLER ECHOCARDIOGRAPHY     ESOPHAGOGASTRODUODENOSCOPY (EGD) WITH PROPOFOL N/A 01/25/2019   Procedure: ESOPHAGOGASTRODUODENOSCOPY (EGD) WITH PROPOFOL;  Surgeon: Corbin Ade, MD; normal esophagus without dilation due to inability to pass dilator beyond the hypopharynx, 1 small nonbleeding gastric ulcer s/p biopsied, otherwise normal exam.  Pathology with ulcer with reactive changes, no H. pylori, metaplasia, dysplasia, or malignancy.   ESOPHAGOGASTRODUODENOSCOPY (EGD) WITH PROPOFOL N/A 08/21/2019   Procedure: ESOPHAGOGASTRODUODENOSCOPY (EGD) WITH PROPOFOL;  Surgeon: Corbin Ade, MD; Normal esophagus. Previously noted gastric ulcer completely healed. Normal examined duodenum.    JOINT REPLACEMENT  07/01/2010   left hip   KNEE  SURGERY Right    arthroscopy   left hip replaced  07/01/2010   SPINE SURGERY  2006   cervical   stress dipyridamole myocardial perfusion     THYROIDECTOMY     TONSILLECTOMY     VESICOVAGINAL FISTULA CLOSURE W/ TAH     Social History   Socioeconomic History   Marital status: Married    Spouse name: Not on file  Number of children: 2   Years of education: 14   Highest education level: Some college, no degree  Occupational History   Occupation: retired     Comment: bank  Tobacco Use   Smoking status: Never    Passive exposure: Yes   Smokeless tobacco: Never   Tobacco comments:    Husband smokes in the home  Vaping Use   Vaping status: Never Used  Substance and Sexual Activity   Alcohol use: No   Drug use: No   Sexual activity: Not Currently  Other Topics Concern   Not on file  Social History Narrative   Lives with husband, at home   No caffeine   Social Drivers of Corporate investment banker Strain: Low Risk  (06/10/2022)   Overall Financial Resource Strain (CARDIA)    Difficulty of Paying Living Expenses: Not hard at all  Food Insecurity: No Food Insecurity (06/10/2022)   Hunger Vital Sign    Worried About Running Out of Food in the Last Year: Never true    Ran Out of Food in the Last Year: Never true  Transportation Needs: No Transportation Needs (06/10/2022)   PRAPARE - Administrator, Civil Service (Medical): No    Lack of Transportation (Non-Medical): No  Physical Activity: Insufficiently Active (09/13/2022)   Exercise Vital Sign    Days of Exercise per Week: 3 days    Minutes of Exercise per Session: 20 min  Stress: No Stress Concern Present (06/10/2022)   [employer] of Occupational Health - Occupational Stress Questionnaire    Feeling of Stress : Not at all  Social Connections: Socially Integrated (06/10/2022)   Social Connection and Isolation Panel [NHANES]    Frequency of Communication with Friends and Family: More than three times a week     Frequency of Social Gatherings with Friends and Family: Twice a week    Attends Religious Services: More than 4 times per year    Active Member of [employer] or Organizations: Yes    Attends Engineer, structural: More than 4 times per year    Marital Status: Married  Catering manager Violence: Not At Risk (08/31/2021)   Humiliation, Afraid, Rape, and Kick questionnaire    Fear of Current or Ex-Partner: No    Emotionally Abused: No    Physically Abused: No    Sexually Abused: No   Current Outpatient Medications  Medication Sig Dispense Refill   ACCU-CHEK GUIDE test strip USE AS INSTRUCTED TO CHECK BLOOD SUGAR 2 TIMES PER DAY DX CODE E11.65 100 strip 3   Cetirizine HCl 10 MG TBDP Take one tablet by mouth two times daily for allergy (Patient taking differently: Take 1 tablet by mouth daily.) 60 tablet 5   Cholecalciferol (VITAMIN D3) 50 MCG (2000 UT) TABS Take 2,000 Units by mouth daily.     ezetimibe (ZETIA) 10 MG tablet TAKE 1 TABLET BY MOUTH EVERY DAY 90 tablet 1   gabapentin (NEURONTIN) 100 MG capsule TAKE ONE CAPSULE TWICE DAILY AND THREE AT BEDTIME 150 capsule 1   hydrOXYzine (ATARAX) 10 MG tablet Take one tablet twice daily and two tablets at bedtime as needed for generalized itching. 360 tablet 1   imipramine (TOFRANIL) 50 MG tablet Take 1 tablet (50 mg total) by mouth at bedtime. 90 tablet 3   insulin glargine, 1 Unit Dial, (TOUJEO SOLOSTAR) 300 UNIT/ML Solostar Pen INJECT 30-50 UNITS TWO TIMES DAILY 9 mL 1   insulin lispro (HUMALOG KWIKPEN) 100 UNIT/ML KwikPen  Inject 22 Units into the skin 3 (three) times daily. 15 mL 1   Insulin Pen Needle 32G X 4 MM MISC Use 4x a day 300 each 3   isosorbide mononitrate (IMDUR) 30 MG 24 hr tablet TAKE HALF A TABLET BY MOUTH DAILY 45 tablet 3   Lancets (ACCU-CHEK MULTICLIX) lancets Use as instructed three times daily dx 250.01 100 each 5   levothyroxine (SYNTHROID) 112 MCG tablet TAKE 1 TABLET BY MOUTH EVERY DAY BEFORE BREAKFAST 90 tablet 3    metFORMIN (GLUCOPHAGE) 1000 MG tablet Take 1 tablet (1,000 mg total) by mouth 2 (two) times daily. 180 tablet 1   metoprolol tartrate (LOPRESSOR) 50 MG tablet TAKE 1 TABLET BY MOUTH TWICE A DAY 180 tablet 2   pantoprazole (PROTONIX) 40 MG tablet TAKE 1 TABLET BY MOUTH EVERY DAY 90 tablet 1   PARoxetine (PAXIL) 20 MG tablet TAKE 1 TABLET EVERY DAY 90 tablet 3   polyethylene glycol powder (GLYCOLAX/MIRALAX) 17 GM/SCOOP powder Take 1 Container by mouth daily as needed.     pravastatin (PRAVACHOL) 80 MG tablet TAKE 1 TABLET EVERY DAY 90 tablet 3   Semaglutide, 2 MG/DOSE, 8 MG/3ML SOPN Inject 2 mg as directed once a week. 9 mL 2   No current facility-administered medications for this visit.     Allergies  Allergen Reactions   Lipitor [Atorvastatin Calcium] Other (See Comments)    Muscle aches   Actos [Pioglitazone] Other (See Comments)    Peripheral edema   Crestor [Rosuvastatin] Other (See Comments)    Muscle aches   Daypro [Oxaprozin] Hives   Nsaids Hives   Sulfonamide Derivatives Hives   Family History  Problem Relation Age of Onset   Heart attack Father    Heart failure Mother    Asthma Daughter    Sleep apnea Son        CPAP   Colon cancer Neg Hx    PE: BP 120/70   Pulse 71   Ht 5\' 5"  (1.651 m)   Wt 181 lb 6.4 oz (82.3 kg)   LMP 11/11/2016   SpO2 95%   BMI 30.19 kg/m  Wt Readings from Last 3 Encounters:  06/30/23 181 lb 6.4 oz (82.3 kg)  05/04/23 181 lb (82.1 kg)  03/22/23 182 lb 1.3 oz (82.6 kg)   Constitutional: overweight, in NAD Eyes: no exophthalmos ENT: no neck masses palpated, no cervical lymphadenopathy Cardiovascular: RRR, No MRG Respiratory: CTA B Musculoskeletal: no deformities Skin:  no rashes Neurological: no tremor with outstretched hands  ASSESSMENT: 1. DM2, insulin-dependent, uncontrolled, with long-term complications - CAD - DR  2.  Papillary thyroid cancer  3.  Postsurgical hypothyroidism  PLAN:  1. Patient with longstanding,  uncontrolled, type 2 diabetes, on metformin, basal large bolus insulin regimen and weekly GLP-1 receptor agonist, restarted at last visit.  At that time, she was off Ozempic due to price.  Her HbA1c improved, though, from 11.2% to 9.5%.  Reviewing the CGM trends, sugars were fluctuating within the target range but increasing significantly after lunch and then again after dinner.  We gave her paperwork for patient assistance program to start back on Ozempic as her previous application form was expired.  We did not change the rest of the regimen. CGM interpretation: -At today's visit, we reviewed her CGM downloads: It appears that 50% of values are in target range (goal >70%), while 50% are higher than 180 (goal <25%), and 0% are lower than 70 (goal <4%).  The calculated average blood  sugar is 186.  The projected HbA1c for the next 3 months (GMI) is 7.8%. -Reviewing the CGM trends, sugars appear to be improved from before, but they are fluctuating around the upper limit of the target range, 180 mg/dL, and increasing gradually throughout the day, particularly after dinner.  Upon questioning, she is missing the Toujeo dose at night and she is also worried that this may drop her blood sugars too low if she took it at night.  Therefore, I advised her to take a higher dose of Toujeo in the morning, and stop taking the dose at night.  We also discussed about making sure that she is taking NovoLog consistently.  She is not injecting it 15 minutes before meals and we again discussed about the importance of doing so.  I advised her that if she forgets to take it 15 minutes before meals, she may need to reduce the dose.  Will continue the same dose of metformin and Ozempic for now. - I suggested to:  Patient Instructions  Please continue: - Metformin 1000 mg 2x a day with meals. - Novolog 18-20 units 15 min before each meal  - Ozempic 2 mg weekly  Change: - Toujeo 50 units in am  Please continue levothyroxine 112  mcg daily.  Take the thyroid hormone every day, with water, at least 30 minutes before breakfast, separated by at least 4 hours from: - acid reflux medications - calcium - iron - multivitamins   Please stop at the lab.  Please return in 3-4 months.  - we checked her HbA1c: 8.8% (lower) - advised to check sugars at different times of the day - 4x a day, rotating check times - advised for yearly eye exams >> she is UTD - return to clinic in 3-4 months  2.  Papillary thyroid cancer -Previous thyroid cancer records were reviewed from Dr. Patrecia Pace -She has metastatic thyroid cancer and had for RAI treatments.  She also had increased signal on the PET scan from 2009, however in 2010, another PET scan showed only possible inflammatory lymph nodes in the area -The latest ultrasound from 05/2020 showed no concerning masses.  Previous ultrasound showed a specific calcified region in the right upper neck in the 2019 neck CT showed that the calcified mass was stable and most likely benign -I plan to repeat another ultrasound within the next year - Latest thyroglobulin level obtained 02/2023 was 0.3, slightly lower than before, but overall not much changed over the last several years. - We will recheck the thyroglobulin and antibody levels at next visit  3.  Postsurgical hypothyroidism - latest thyroid labs reviewed with pt. >> TSH was suppressed: Lab Results  Component Value Date   TSH 0.10 (L) 03/03/2023  - she continues on LT4 112 mcg daily, dose decreased 02/2023 - pt feels good on this dose. - we discussed about taking the thyroid hormone every day, with water, >30 minutes before breakfast, separated by >4 hours from acid reflux medications, calcium, iron, multivitamins. Pt. is taking it correctly. - will check thyroid tests today: TSH and fT4 - If labs are abnormal, she will need to return for repeat TFTs in 1.5 months  Carlus Pavlov, MD PhD [facility] Endocrinology

## 2023-06-30 NOTE — Patient Instructions (Addendum)
 Please continue: - Metformin 1000 mg 2x a day with meals. - Novolog 18-20 units 15 min before each meal  - Ozempic 2 mg weekly  Change: - Toujeo 50 units in am  Please continue levothyroxine 112 mcg daily.  Take the thyroid hormone every day, with water, at least 30 minutes before breakfast, separated by at least 4 hours from: - acid reflux medications - calcium - iron - multivitamins   Please stop at the lab.  Please return in 3-4 months.

## 2023-07-01 ENCOUNTER — Encounter: Payer: Self-pay | Admitting: Internal Medicine

## 2023-07-01 LAB — T4, FREE: Free T4: 1.2 ng/dL (ref 0.8–1.8)

## 2023-07-01 LAB — TSH: TSH: 0.84 m[IU]/L (ref 0.40–4.50)

## 2023-07-01 LAB — MICROALBUMIN / CREATININE URINE RATIO
Creatinine, Urine: 95 mg/dL (ref 20–275)
Microalb Creat Ratio: 12 mg/g{creat} (ref <30)
Microalb, Ur: 1.1 mg/dL

## 2023-07-05 ENCOUNTER — Ambulatory Visit: Payer: Medicare HMO | Admitting: Nutrition

## 2023-07-06 ENCOUNTER — Ambulatory Visit (INDEPENDENT_AMBULATORY_CARE_PROVIDER_SITE_OTHER): Payer: Medicare HMO | Admitting: Psychiatry

## 2023-07-06 DIAGNOSIS — F439 Reaction to severe stress, unspecified: Secondary | ICD-10-CM

## 2023-07-06 DIAGNOSIS — Z630 Problems in relationship with spouse or partner: Secondary | ICD-10-CM

## 2023-07-06 NOTE — Progress Notes (Signed)
 IN-PERSON  THERAPIST PROGRESS NOTE  Session Time:  Wednesday 07/06/2023 2:10 PM - 2:55 PM   Participation Level: Active  Behavioral Response: CasualAlertAnxious  Type of Therapy: Individual Therapy  Treatment Goals addressed: Verbalize an understanding and resolution of current interpersonal problems Learn and implement conflict resolution skills to resolve interpersonal problems   ProgressTowards Goals: Progressing  Interventions: Supportive/CBT  Summary: Jennifer Blackburn is a 78 y.o. female who is referred for services by PCP Dr. Alberteen Huge due to pt experiencing stress. She denies any psychiatric hospitalizations. She is a returning pt to this clinician and last was seen in 2016.  Patient is resuming services as she reports increased stress triggered by health issues, increased fatigue, and marital issues.  Patient's current symptoms include fatigue, worry, and muscle tension.  Patient last was seen about 7-8   weeks  ago.  She reports continued stress regarding marital issues but reports experiencing some relief as husband was out of town for 2 weeks.  She also reports housesitting for her son for 3-4 nights 2 weeks later.  Patient reports this was very helpful.  She also has become more involved in activities including attending the chair exercise group and the senior group.  Patient reports husband remains very critical and tends to make negative comments to patient.  She reports trying to be more assertive with husband but reports he still has a tendency to dismiss her feelings.    Suicidal/Homicidal: Nowithout intent/plan  Therapist Response: Reviewed symptoms, discussed stressors, facilitated expression of thoughts and feelings, validated feelings, praised and reinforced patient's efforts to become more involved in positive relationships and sources of energy, discussed effects, praised and reinforced patient's efforts to try to improve assertiveness skills, discussed effects,  discussed rationale for and developed plan with patient to review assertiveness workbook in preparation for next session, continued to assist patient examine her relationship with her husband, began to discuss the dynamics in the relationship, will continue to discuss next session  Plan: Return again in 2 weeks.  Diagnosis: Stress due to marital problems  Collaboration of Care: Primary Care Provider AEB patient sees PCP Dr. Alberteen Huge  Patient/Guardian was advised Release of Information must be obtained prior to any record release in order to collaborate their care with an outside provider. Patient/Guardian was advised if they have not already done so to contact the registration department to sign all necessary forms in order for us  to release information regarding their care.   Consent: Patient/Guardian gives verbal consent for treatment and assignment of benefits for services provided during this visit. Patient/Guardian expressed understanding and agreed to proceed.   Dicie Foster, LCSW 07/06/2023

## 2023-07-08 DIAGNOSIS — E1165 Type 2 diabetes mellitus with hyperglycemia: Secondary | ICD-10-CM | POA: Diagnosis not present

## 2023-07-12 ENCOUNTER — Ambulatory Visit: Attending: Cardiovascular Disease | Admitting: Cardiovascular Disease

## 2023-07-12 ENCOUNTER — Encounter: Payer: Self-pay | Admitting: Cardiovascular Disease

## 2023-07-12 VITALS — BP 142/64 | HR 79 | Ht 65.0 in | Wt 175.0 lb

## 2023-07-12 DIAGNOSIS — I10 Essential (primary) hypertension: Secondary | ICD-10-CM | POA: Diagnosis not present

## 2023-07-12 DIAGNOSIS — E782 Mixed hyperlipidemia: Secondary | ICD-10-CM | POA: Diagnosis not present

## 2023-07-12 DIAGNOSIS — I2510 Atherosclerotic heart disease of native coronary artery without angina pectoris: Secondary | ICD-10-CM

## 2023-07-12 NOTE — Assessment & Plan Note (Signed)
 History of essential hypertension with blood pressure measured today at 142/64.  This is higher than it usually is when she checks it at home.  She is on metoprolol .

## 2023-07-12 NOTE — Patient Instructions (Signed)
 Medication Instructions:  Your physician recommends that you continue on your current medications as directed. Please refer to the Current Medication list given to you today.  *If you need a refill on your cardiac medications before your next appointment, please call your pharmacy*  Follow-Up: At [facility], you and your health needs are our priority.  As part of our continuing mission to provide you with exceptional heart care, our providers are all part of one team.  This team includes your primary Cardiologist (physician) and Advanced Practice Providers or APPs (Physician Assistants and Nurse Practitioners) who all work together to provide you with the care you need, when you need it.  Your next appointment:   12 month(s)  Provider:   Marcie Sever, PA-C, Callie Goodrich, PA-C, Kathleen Johnson, PA-C, Hao Meng, PA-C, Marlana Silvan, NP, or Katlyn West, NP       Then, Lauro Portal, MD will plan to see you again in 2 year(s).    We recommend signing up for the patient portal called "MyChart".  Sign up information is provided on this After Visit Summary.  MyChart is used to connect with patients for Virtual Visits (Telemedicine).  Patients are able to view lab/test results, encounter notes, upcoming appointments, etc.  Non-urgent messages can be sent to your provider as well.   To learn more about what you can do with MyChart, go to [email or web address].   Other Instructions       1st Floor: - Lobby - Registration  - Pharmacy  - Lab - Cafe  2nd Floor: - PV Lab - Diagnostic Testing (echo, CT, nuclear med)  3rd Floor: - Vacant  4th Floor: - TCTS (cardiothoracic surgery) - AFib Clinic - Structural Heart Clinic - Vascular Surgery  - Vascular Ultrasound  5th Floor: - [facility] Card[facility] (general and EP) - Clinical Pharmacy for coumadin, hypertension, lipid, weight-loss medications, and med management appointments    Valet parking services will be available  as well.

## 2023-07-12 NOTE — Progress Notes (Signed)
 07/12/2023 Jennifer Blackburn   [birth year]  [id number]  Primary Physician Towanda Fret, MD Primary Cardiologist: Avanell Leigh MD FACP, [place], [place], [place]  HPI:  Jennifer Blackburn is a 78 y.o.  moderately overweight, married [place] American female, mother of 2, grandmother to 4 grandchildren who I last saw in the office 07/10/22. She has a history of minimal CAD by cath back in June 2005 with normal LV function. Her other problems include treated hypertension, diabetes and dyslipidemia.  She does have a strong family history of heart disease with a father that died of an MI at age 71. Most recent lipid profile performed by Dr. Rodolph Clap on 11/14/14 revealing a total cholesterol 175, LDL of 112 and HDL of 42.. Dr. Rodolph Clap continues to follow her with profile . She has had several episodes of nocturnal chest pressure which has awakened her from sleep with a "heavy sensation on her chest and a feeling of diaphoresis since I last saw her but these are infrequent and have not changed frequency or severity. She did see Humphrey Magnuson in the office 02/17/17 complaining of chest pain and a Myoview  stress test performed 02/23/17 was low risk.    Since I saw her in the office 1 year ago she continues to do well.  She checks her blood pressure at home routinely and she is normotensive.  She denies chest pain or shortness of breath..     Current Meds  Medication Sig   ACCU-CHEK GUIDE test strip USE AS INSTRUCTED TO CHECK BLOOD SUGAR 2 TIMES PER DAY DX CODE E11.65   Cetirizine  HCl 10 MG TBDP Take one tablet by mouth two times daily for allergy (Patient taking differently: Take 1 tablet by mouth daily.)   Cholecalciferol (VITAMIN D3) 50 MCG (2000 UT) TABS Take 2,000 Units by mouth daily.   ezetimibe  (ZETIA ) 10 MG tablet TAKE 1 TABLET BY MOUTH EVERY DAY   gabapentin  (NEURONTIN ) 100 MG capsule TAKE ONE CAPSULE TWICE DAILY AND THREE AT BEDTIME   hydrOXYzine  (ATARAX ) 10 MG tablet Take one tablet twice daily  and two tablets at bedtime as needed for generalized itching.   imipramine  (TOFRANIL ) 50 MG tablet Take 1 tablet (50 mg total) by mouth at bedtime.   insulin  glargine, 1 Unit Dial , (TOUJEO  SOLOSTAR) 300 UNIT/ML Solostar Pen INJECT 30-50 UNITS TWO TIMES DAILY   insulin  lispro (HUMALOG  KWIKPEN) 100 UNIT/ML KwikPen Inject 22 Units into the skin 3 (three) times daily.   Insulin  Pen Needle 32G X 4 MM MISC Use 4x a day   isosorbide  mononitrate (IMDUR ) 30 MG 24 hr tablet TAKE HALF A TABLET BY MOUTH DAILY   levothyroxine  (SYNTHROID ) 112 MCG tablet TAKE 1 TABLET BY MOUTH EVERY DAY BEFORE BREAKFAST   metFORMIN  (GLUCOPHAGE ) 1000 MG tablet Take 1 tablet (1,000 mg total) by mouth 2 (two) times daily.   metoprolol  tartrate (LOPRESSOR ) 50 MG tablet TAKE 1 TABLET BY MOUTH TWICE A DAY   pantoprazole  (PROTONIX ) 40 MG tablet TAKE 1 TABLET BY MOUTH EVERY DAY   PARoxetine  (PAXIL ) 20 MG tablet TAKE 1 TABLET EVERY DAY   pravastatin  (PRAVACHOL ) 80 MG tablet TAKE 1 TABLET EVERY DAY   Semaglutide , 2 MG/DOSE, 8 MG/3ML SOPN Inject 2 mg as directed once a week.     Allergies  Allergen Reactions   Lipitor [Atorvastatin  Calcium ] Other (See Comments)    Muscle aches   Actos [Pioglitazone] Other (See Comments)    Peripheral edema   Crestor  [Rosuvastatin ] Other (See  Comments)    Muscle aches   Daypro [Oxaprozin] Hives   Nsaids Hives   Sulfonamide Derivatives Hives    Social History   Socioeconomic History   Marital status: Married    Spouse name: Not on file   Number of children: 2   Years of education: 14   Highest education level: Some college, no degree  Occupational History   Occupation: retired     Comment: bank  Tobacco Use   Smoking status: Never    Passive exposure: Yes   Smokeless tobacco: Never   Tobacco comments:    Husband smokes in the home  Vaping Use   Vaping status: Never Used  Substance and Sexual Activity   Alcohol use: No   Drug use: No   Sexual activity: Not Currently  Other  Topics Concern   Not on file  Social History Narrative   Lives with husband, at home   No caffeine   Social Drivers of Corporate investment banker Strain: Low Risk  (06/10/2022)   Overall Financial Resource Strain (CARDIA)    Difficulty of Paying Living Expenses: Not hard at all  Food Insecurity: No Food Insecurity (06/10/2022)   Hunger Vital Sign    Worried About Running Out of Food in the Last Year: Never true    Ran Out of Food in the Last Year: Never true  Transportation Needs: No Transportation Needs (06/10/2022)   PRAPARE - Administrator, Civil Service (Medical): No    Lack of Transportation (Non-Medical): No  Physical Activity: Insufficiently Active (09/13/2022)   Exercise Vital Sign    Days of Exercise per Week: 3 days    Minutes of Exercise per Session: 20 min  Stress: No Stress Concern Present (06/10/2022)   [employer] of Occupational Health - Occupational Stress Questionnaire    Feeling of Stress : Not at all  Social Connections: Socially Integrated (06/10/2022)   Social Connection and Isolation Panel [NHANES]    Frequency of Communication with Friends and Family: More than three times a week    Frequency of Social Gatherings with Friends and Family: Twice a week    Attends Religious Services: More than 4 times per year    Active Member of [employer] or Organizations: Yes    Attends Engineer, structural: More than 4 times per year    Marital Status: Married  Catering manager Violence: Not At Risk (08/31/2021)   Humiliation, Afraid, Rape, and Kick questionnaire    Fear of Current or Ex-Partner: No    Emotionally Abused: No    Physically Abused: No    Sexually Abused: No     Review of Systems: General: negative for chills, fever, night sweats or weight changes.  Cardiovascular: negative for chest pain, dyspnea on exertion, edema, orthopnea, palpitations, paroxysmal nocturnal dyspnea or shortness of breath Dermatological: negative for  rash Respiratory: negative for cough or wheezing Urologic: negative for hematuria Abdominal: negative for nausea, vomiting, diarrhea, bright red blood per rectum, melena, or hematemesis Neurologic: negative for visual changes, syncope, or dizziness All other systems reviewed and are otherwise negative except as noted above.    Blood pressure (!) 142/64, pulse 79, height 5\' 5"  (1.651 m), weight 175 lb (79.4 kg), last menstrual period 11/11/2016, SpO2 96%.  General appearance: alert and no distress Neck: no adenopathy, no carotid bruit, no JVD, supple, symmetrical, trachea midline, and thyroid  not enlarged, symmetric, no tenderness/mass/nodules Lungs: clear to auscultation bilaterally Heart: regular rate and rhythm, S1, S2  normal, no murmur, click, rub or gallop Extremities: extremities normal, atraumatic, no cyanosis or edema Pulses: 2+ and symmetric Skin: Skin color, texture, turgor normal. No rashes or lesions Neurologic: Grossly normal  EKG EKG Interpretation Date/Time:  Tuesday July 12 2023 11:52:02 EDT Ventricular Rate:  79 PR Interval:  158 QRS Duration:  78 QT Interval:  388 QTC Calculation: 444 R Axis:   -12  Text Interpretation: Normal sinus rhythm Inferior infarct , age undetermined When compared with ECG of 28-May-2022 16:17, No significant change was found Confirmed by Lauro Portal [phone]) on 07/12/2023 11:59:45 AM    ASSESSMENT AND PLAN:   Primary hypertension History of essential hypertension with blood pressure measured today at 142/64.  This is higher than it usually is when she checks it at home.  She is on metoprolol .  Hyperlipidemia History of hyperlipidemia on statin therapy and Zetia  with lipid profile performed 02/21/2023 revealing total cholesterol 136, LDL 67 and HDL 45.     Avanell Leigh MD FACP,FACC,FAHA, [facility] 07/12/2023 12:06 PM

## 2023-07-12 NOTE — Assessment & Plan Note (Signed)
 History of hyperlipidemia on statin therapy and Zetia  with lipid profile performed 02/21/2023 revealing total cholesterol 136, LDL 67 and HDL 45.

## 2023-07-13 ENCOUNTER — Encounter: Attending: Family Medicine | Admitting: Nutrition

## 2023-07-13 VITALS — Ht 65.0 in | Wt 178.0 lb

## 2023-07-13 DIAGNOSIS — E1159 Type 2 diabetes mellitus with other circulatory complications: Secondary | ICD-10-CM | POA: Diagnosis not present

## 2023-07-13 DIAGNOSIS — I10 Essential (primary) hypertension: Secondary | ICD-10-CM | POA: Diagnosis not present

## 2023-07-13 DIAGNOSIS — I2510 Atherosclerotic heart disease of native coronary artery without angina pectoris: Secondary | ICD-10-CM | POA: Diagnosis not present

## 2023-07-13 DIAGNOSIS — E782 Mixed hyperlipidemia: Secondary | ICD-10-CM | POA: Insufficient documentation

## 2023-07-13 NOTE — Progress Notes (Unsigned)
 Medical Nutrition Therapy  Appointment Start time:  1500 Appointment End time:  1530  Primary concerns today: DM Typ3 2  Referral diagnosis: E11.8 Preferred learning style: No Preference  Learning readiness: Ready   NUTRITION ASSESSMENT Dm Follow up   A1C down to 8% from 10.1%.. Saw heart doctor yesterday.  On Ozempic . Toujeo  60 units at night, Novolog   22 units with meals. Has not been consistemt with taking meal time insulin . Forgets. Sometimes skips meals. Metformin  1000 mg BID. Endocrinology: Dr. Aldona Amel.  Willing to work on being more consistent with taking medications and eating meals on schedule times.  Sees Dr. Ned Balint for Endcrinology and Dr. Rodolph Clap, PCP.  Goals set previously  Increase whole plant based foods with meals-has been working on it. Eat breakfast at 9 am, lunch 12-2 and dinner 5-7 pm. Trying to do better. Drink only water -working on it. Use Dexcom to assess BS control-done Take medications as prescribed.-tried to be compliant Get A1C down to 7%-no results yet Call MD if BS drop in the 70's/low 80's for medication adjustments.    Clinical Medical Hx:  Past Medical History:  Diagnosis Date   Anxiety    Arthritis    CAD (coronary artery disease)    Depression    Diabetes mellitus type II    without complication   DJD (degenerative joint disease) of lumbar spine    Hypercholesteremia    Hyperlipidemia    Hypertension    benign    Hypothyroidism    Thyroid  cancer (HCC) 2001    Medications:  Current Outpatient Medications on File Prior to Visit  Medication Sig Dispense Refill   ACCU-CHEK GUIDE test strip USE AS INSTRUCTED TO CHECK BLOOD SUGAR 2 TIMES PER DAY DX CODE E11.65 100 strip 3   Cetirizine  HCl 10 MG TBDP Take one tablet by mouth two times daily for allergy (Patient taking differently: Take 1 tablet by mouth daily.) 60 tablet 5   Cholecalciferol (VITAMIN D3) 50 MCG (2000 UT) TABS Take 2,000 Units by mouth daily.     ezetimibe  (ZETIA ) 10 MG  tablet TAKE 1 TABLET BY MOUTH EVERY DAY 90 tablet 1   gabapentin  (NEURONTIN ) 100 MG capsule TAKE ONE CAPSULE TWICE DAILY AND THREE AT BEDTIME 150 capsule 1   hydrOXYzine  (ATARAX ) 10 MG tablet Take one tablet twice daily and two tablets at bedtime as needed for generalized itching. 360 tablet 1   imipramine  (TOFRANIL ) 50 MG tablet Take 1 tablet (50 mg total) by mouth at bedtime. 90 tablet 3   insulin  glargine, 1 Unit Dial , (TOUJEO  SOLOSTAR) 300 UNIT/ML Solostar Pen INJECT 30-50 UNITS TWO TIMES DAILY 9 mL 1   insulin  lispro (HUMALOG  KWIKPEN) 100 UNIT/ML KwikPen Inject 22 Units into the skin 3 (three) times daily. 15 mL 1   Insulin  Pen Needle 32G X 4 MM MISC Use 4x a day 300 each 3   isosorbide  mononitrate (IMDUR ) 30 MG 24 hr tablet TAKE HALF A TABLET BY MOUTH DAILY 45 tablet 3   Lancets (ACCU-CHEK MULTICLIX) lancets Use as instructed three times daily dx 250.01 (Patient not taking: Reported on 07/12/2023) 100 each 5   levothyroxine  (SYNTHROID ) 112 MCG tablet TAKE 1 TABLET BY MOUTH EVERY DAY BEFORE BREAKFAST 90 tablet 3   metFORMIN  (GLUCOPHAGE ) 1000 MG tablet Take 1 tablet (1,000 mg total) by mouth 2 (two) times daily. 180 tablet 1   metoprolol  tartrate (LOPRESSOR ) 50 MG tablet TAKE 1 TABLET BY MOUTH TWICE A DAY 180 tablet 2   pantoprazole  (PROTONIX ) 40  MG tablet TAKE 1 TABLET BY MOUTH EVERY DAY 90 tablet 1   PARoxetine  (PAXIL ) 20 MG tablet TAKE 1 TABLET EVERY DAY 90 tablet 3   polyethylene glycol powder (GLYCOLAX /MIRALAX ) 17 GM/SCOOP powder Take 1 Container by mouth daily as needed. (Patient not taking: Reported on 07/12/2023)     pravastatin  (PRAVACHOL ) 80 MG tablet TAKE 1 TABLET EVERY DAY 90 tablet 3   Semaglutide , 2 MG/DOSE, 8 MG/3ML SOPN Inject 2 mg as directed once a week. 9 mL 2   No current facility-administered medications on file prior to visit.    Labs:  Lab Results  Component Value Date   HGBA1C 8.8 (A) 06/30/2023    Notable Signs/Symptoms: Increased fatigue, frequent urination,  hunger, poor sleep  Lifestyle & Dietary Hx Lives with her husband. Retired.  Estimated daily fluid intake: 40 oz Supplements:  Sleep: Poor Stress / self-care: her health Current average weekly physical activity: ADL  24-Hr Dietary Recall Eats 3 meals per day. Tends to have a lot of processed food due to husband's preferences. Eats out some.  Estimated Energy Needs Calories: 1200 Carbohydrate: 135g Protein: 90g Fat: 33g   NUTRITION DIAGNOSIS  NB-1.1 Food and nutrition-related knowledge deficit As related to Diabetes Type 2 Uncontrolled.  As evidenced by A1C 10.1%.   NUTRITION INTERVENTION  Nutrition education (E-1) on the following topics:  Nutrition and Diabetes education provided on My Plate, CHO counting, meal planning, portion sizes, timing of meals, avoiding snacks between meals unless having a low blood sugar, target ranges for A1C and blood sugars, signs/symptoms and treatment of hyper/hypoglycemia, monitoring blood sugars, taking medications as prescribed, benefits of exercising 30 minutes per day and prevention of complications of DM.  Lifestyle Medicine  - Whole Food, Plant Predominant Nutrition is highly recommended: Eat Plenty of vegetables, Mushrooms, fruits, Legumes, Whole Grains, Nuts, seeds in lieu of processed meats, processed snacks/pastries red meat, poultry, eggs.    -It is better to avoid simple carbohydrates including: Cakes, Sweet Desserts, Ice Cream, Soda (diet and regular), Sweet Tea, Candies, Chips, Cookies, Store Bought Juices, Alcohol in Excess of  1-2 drinks a day, Lemonade,  Artificial Sweeteners, Doughnuts, Coffee Creamers, "Sugar-free" Products, etc, etc.  This is not a complete list.....  Exercise: If you are able: 30 -60 minutes a day ,4 days a week, or 150 minutes a week.  The longer the better.  Combine stretch, strength, and aerobic activities.  If you were told in the past that you have high risk for cardiovascular diseases, you may seek  evaluation by your heart doctor prior to initiating moderate to intense exercise programs.  Assessment:  Primary concerns today: Patient here for initiation of Glucose Monitoring. Dexcom G7  Medications: Toujeo  and Novolog      Intervention:   Understanding Glucose Sensing Dexcom G7 Programming Sensor Information  High Glucose: 250 mg/dl  Low Glucose 70 mg/dl  Other settings to be added at follow up visit Starting [employer] of Product  Entering BG, Calibration Technique and Graphs with Public librarian and Alarms   Follow Up Patient to see MD for insulin  dose adjustments and to schedule visit with me for CGM follow up within 2 months week(s).  Handouts Provided Include  Lifestyle Medicine handouts  Learning Style & Readiness for Change Teaching method utilized: Visual & Auditory  Demonstrated degree of understanding via: Teach Back  Barriers to learning/adherence to lifestyle change: Non3  Goals Established by Pt  Avoid sweets, snacks and heavy  desserts. Increase fruits, vegetables and whole grains. Start exercising 30 minutes 3 times per week Drink only water  and cut sodas, juice or tea.  MONITORING & EVALUATION Dietary intake, weekly physical activity, and BS  in 3 months.  Next Steps  Patient is to work on meal planning and focus on whole plant base foods.Aaron Aas

## 2023-07-20 ENCOUNTER — Ambulatory Visit (HOSPITAL_COMMUNITY): Payer: Medicare HMO | Admitting: Psychiatry

## 2023-07-26 ENCOUNTER — Telehealth: Payer: Self-pay

## 2023-07-26 NOTE — Telephone Encounter (Signed)
 Patient Assistance  Medication:Novolog  Dosage:U100 Quantity:5 boxes

## 2023-07-27 ENCOUNTER — Encounter (HOSPITAL_COMMUNITY): Payer: Self-pay

## 2023-07-27 NOTE — Telephone Encounter (Signed)
 Patient's spouse, Pierina Lepisto, came in to office today and picked up 5 boxes of patient assistance Novolog .

## 2023-08-03 ENCOUNTER — Encounter: Payer: Self-pay | Admitting: Family Medicine

## 2023-08-03 ENCOUNTER — Ambulatory Visit (INDEPENDENT_AMBULATORY_CARE_PROVIDER_SITE_OTHER): Payer: Medicare HMO | Admitting: Family Medicine

## 2023-08-03 ENCOUNTER — Encounter: Payer: Self-pay | Admitting: Nutrition

## 2023-08-03 VITALS — BP 133/74 | HR 88 | Resp 18 | Ht 65.0 in | Wt 178.0 lb

## 2023-08-03 DIAGNOSIS — M5410 Radiculopathy, site unspecified: Secondary | ICD-10-CM | POA: Diagnosis not present

## 2023-08-03 DIAGNOSIS — F324 Major depressive disorder, single episode, in partial remission: Secondary | ICD-10-CM

## 2023-08-03 DIAGNOSIS — F419 Anxiety disorder, unspecified: Secondary | ICD-10-CM | POA: Diagnosis not present

## 2023-08-03 DIAGNOSIS — I10 Essential (primary) hypertension: Secondary | ICD-10-CM | POA: Diagnosis not present

## 2023-08-03 DIAGNOSIS — Z794 Long term (current) use of insulin: Secondary | ICD-10-CM

## 2023-08-03 DIAGNOSIS — E782 Mixed hyperlipidemia: Secondary | ICD-10-CM | POA: Diagnosis not present

## 2023-08-03 DIAGNOSIS — R2681 Unsteadiness on feet: Secondary | ICD-10-CM | POA: Diagnosis not present

## 2023-08-03 DIAGNOSIS — E1169 Type 2 diabetes mellitus with other specified complication: Secondary | ICD-10-CM | POA: Diagnosis not present

## 2023-08-03 DIAGNOSIS — J3089 Other allergic rhinitis: Secondary | ICD-10-CM

## 2023-08-03 DIAGNOSIS — W19XXXA Unspecified fall, initial encounter: Secondary | ICD-10-CM

## 2023-08-03 DIAGNOSIS — E559 Vitamin D deficiency, unspecified: Secondary | ICD-10-CM | POA: Diagnosis not present

## 2023-08-03 DIAGNOSIS — R296 Repeated falls: Secondary | ICD-10-CM | POA: Insufficient documentation

## 2023-08-03 MED ORDER — CETIRIZINE HCL 10 MG PO TBDP: ORAL_TABLET | ORAL | 5 refills | Status: AC

## 2023-08-03 MED ORDER — GABAPENTIN 100 MG PO CAPS: ORAL_CAPSULE | ORAL | 1 refills | Status: DC

## 2023-08-03 NOTE — Assessment & Plan Note (Signed)
 Three falls in 6 months, refer PT

## 2023-08-03 NOTE — Patient Instructions (Signed)
 Goals Established by Pt  Avoid sweets, snacks and heavy desserts. Increase fruits, vegetables and whole grains. Start exercising 30 minutes 3 times per week Drink only water  and cut sodas, juice or tea.

## 2023-08-03 NOTE — Assessment & Plan Note (Signed)
 Controlled, no change in medication

## 2023-08-03 NOTE — Progress Notes (Signed)
 Jennifer Blackburn     MRN: [id number]      DOB: Apr 07, 1945  Chief Complaint  Patient presents with   Cough    Pt complains of congestion and phlem in chest and throat in the mornings for last several months. Would like referral to ENT   Follow-up    4.5 month follow up. Discuss new referral to dermatologist. Also discuss occasional falls that she thinks might be s/e of gabapentin      HPI Jennifer Blackburn is here for follow up and re-evaluation of chronic medical conditions, medication management and review of any available recent lab and radiology data.  Preventive health is updated, specifically  Cancer screening and Immunization.   Questions or concerns regarding consultations or procedures which the PT has had in the interim are  addressed. The PT has concerns th gabapentoin may be contributing to her falls anmd unsteady gsit, does hav h/o spine surgery and generalized arthritis. Concerns as above Jennifer Blackburn on 5/8 while reaching for hanging baskets , had difficulty getting up has an abrasion on the left knee , using topical antibiotic  ROS Denies recent fever or chills. Denies , ear pain or sore throat. Denies chest congestion, productive cough or wheezing. Denies chest pains, palpitations and leg swelling Denies abdominal pain, nausea, vomiting,diarrhea or constipation.   Denies dysuria, frequency, hesitancy or incontinence. C/o recurrent falls and unsteady gait, 3 in 6 months Denies headaches, seizures, numbness, or tingling. Denies uncontrolled  depression, anxiety or insomnia.  PE  BP 133/74   Pulse 88   Resp 18   Ht 5\' 5"  (1.651 m)   Wt 178 lb (80.7 kg)   LMP 11/11/2016   SpO2 95%   BMI 29.62 kg/m   Patient alert and oriented and in no cardiopulmonary distress.  HEENT: No facial asymmetry, EOMI,     Neck decreased ROM .No sinus tenderness  Chest: Clear to auscultation bilaterally.  CVS: S1, S2 no murmurs, no S3.Regular rate.  ABD: Soft non tender.   Ext: No  edema  MS:  decreased  ROM spine, shoulders, hips and knees.  Skin: Iabrasion with skin breakdown anterior left knee, max diameter approx 3.5 cm,  surounding erythema no drainage Psych: Good eye contact, normal affect. Memory intact not anxious or depressed appearing.  CNS: CN 2-12 intact, power,  normal throughout.no focal deficits noted.   Assessment & Plan  Type 2 diabetes mellitus with other specified complication (HCC) Imporovoing, managed by Endo, now able to get meds and resting supplies Diabetes associated with hypertension, hyperlipidemia, arthritis, and depression  Jennifer Blackburn is reminded of the importance of commitment to daily physical activity for 30 minutes or more, as able and the need to limit carbohydrate intake to 30 to 60 grams per meal to help with blood sugar control.   The need to take medication as prescribed, test blood sugar as directed, and to call between visits if there is a concern that blood sugar is uncontrolled is also discussed.   Jennifer Blackburn is reminded of the importance of daily foot exam, annual eye examination, and good blood sugar, blood pressure and cholesterol control.     Latest Ref Rng & Units 06/30/2023   11:52 AM 06/30/2023   11:25 AM 05/04/2023   12:31 PM 03/03/2023   11:54 AM 02/21/2023   10:20 AM  Diabetic Labs  HbA1c 4.0 - 5.6 %  8.8   9.5    Microalbumin mg/dL 1.1       Micro/Creat Ratio <30  mg/g creat 12       Chol 100 - 199 mg/dL     161   HDL >09 mg/dL     45   Calc LDL 0 - 99 mg/dL     67   Triglycerides 0 - 149 mg/dL     604   Creatinine 5.40 - 1.00 mg/dL   9.81   1.91       4/78/2956    3:00 PM 07/13/2023    3:05 PM 07/12/2023   11:48 AM 06/30/2023   11:21 AM 05/04/2023   11:05 AM 03/22/2023    3:03 PM 03/03/2023   10:59 AM  BP/Weight  Systolic BP 133  213 120 130 132 124  Diastolic BP 74  64 70 68 73 60  Wt. (Lbs) 178 178 175 181.4 181 182.08 184.2  BMI 29.62 kg/m2 29.62 kg/m2 29.12 kg/m2 30.19 kg/m2 30.12 kg/m2 30.3  kg/m2 30.65 kg/m2      Latest Ref Rng & Units 10/11/2022    2:00 PM 04/08/2022   12:00 AM  Foot/eye exam completion dates  Eye Exam No Retinopathy  Retinopathy      Foot Form Completion  Done      This result is from an external source.        Primary hypertension Controlled, no change in medication DASH diet and commitment to daily physical activity for a minimum of 30 minutes discussed and encouraged, as a part of hypertension management. The importance of attaining a healthy weight is also discussed.     08/03/2023    3:00 PM 07/13/2023    3:05 PM 07/12/2023   11:48 AM 06/30/2023   11:21 AM 05/04/2023   11:05 AM 03/22/2023    3:03 PM 03/03/2023   10:59 AM  BP/Weight  Systolic BP 133  086 120 130 132 124  Diastolic BP 74  64 70 68 73 60  Wt. (Lbs) 178 178 175 181.4 181 182.08 184.2  BMI 29.62 kg/m2 29.62 kg/m2 29.12 kg/m2 30.19 kg/m2 30.12 kg/m2 30.3 kg/m2 30.65 kg/m2       Hyperlipidemia Hyperlipidemia:Low fat diet discussed and encouraged.   Lipid Panel  Lab Results  Component Value Date   CHOL 136 02/21/2023   HDL 45 02/21/2023   LDLCALC 67 02/21/2023   TRIG 135 02/21/2023   CHOLHDL 3.0 02/21/2023     Updated lab needed at/ before next visit.   Fall Refer out pt PT twice weekly x 6 weeks  Allergic rhinitis Uncontrolled, inc zyrtec  to twice daily and add daily singulair  Back pain with radiculopathy Reduce gabapenti from 500 mg daily to 300 mg daily  Recurrent falls Three falls in 6 months, refer PT  Anxiety Controlled, no change in medication   Depression, major, single episode, in partial remission (HCC) Controlled, no change in medication

## 2023-08-03 NOTE — Assessment & Plan Note (Addendum)
 Imporovoing, managed by Endo, now able to get meds and resting supplies Diabetes associated with hypertension, hyperlipidemia, arthritis, and depression  Jennifer Blackburn is reminded of the importance of commitment to daily physical activity for 30 minutes or more, as able and the need to limit carbohydrate intake to 30 to 60 grams per meal to help with blood sugar control.   The need to take medication as prescribed, test blood sugar as directed, and to call between visits if there is a concern that blood sugar is uncontrolled is also discussed.   Jennifer Blackburn is reminded of the importance of daily foot exam, annual eye examination, and good blood sugar, blood pressure and cholesterol control.     Latest Ref Rng & Units 06/30/2023   11:52 AM 06/30/2023   11:25 AM 05/04/2023   12:31 PM 03/03/2023   11:54 AM 02/21/2023   10:20 AM  Diabetic Labs  HbA1c 4.0 - 5.6 %  8.8   9.5    Microalbumin mg/dL 1.1       Micro/Creat Ratio <30 mg/g creat 12       Chol 100 - 199 mg/dL     696   HDL >29 mg/dL     45   Calc LDL 0 - 99 mg/dL     67   Triglycerides 0 - 149 mg/dL     528   Creatinine 4.13 - 1.00 mg/dL   2.44   0.10       2/72/5366    3:00 PM 07/13/2023    3:05 PM 07/12/2023   11:48 AM 06/30/2023   11:21 AM 05/04/2023   11:05 AM 03/22/2023    3:03 PM 03/03/2023   10:59 AM  BP/Weight  Systolic BP 133  440 120 130 132 124  Diastolic BP 74  64 70 68 73 60  Wt. (Lbs) 178 178 175 181.4 181 182.08 184.2  BMI 29.62 kg/m2 29.62 kg/m2 29.12 kg/m2 30.19 kg/m2 30.12 kg/m2 30.3 kg/m2 30.65 kg/m2      Latest Ref Rng & Units 10/11/2022    2:00 PM 04/08/2022   12:00 AM  Foot/eye exam completion dates  Eye Exam No Retinopathy  Retinopathy      Foot Form Completion  Done      This result is from an external source.

## 2023-08-03 NOTE — Assessment & Plan Note (Signed)
 Controlled, no change in medication DASH diet and commitment to daily physical activity for a minimum of 30 minutes discussed and encouraged, as a part of hypertension management. The importance of attaining a healthy weight is also discussed.     08/03/2023    3:00 PM 07/13/2023    3:05 PM 07/12/2023   11:48 AM 06/30/2023   11:21 AM 05/04/2023   11:05 AM 03/22/2023    3:03 PM 03/03/2023   10:59 AM  BP/Weight  Systolic BP 133  161 120 130 132 124  Diastolic BP 74  64 70 68 73 60  Wt. (Lbs) 178 178 175 181.4 181 182.08 184.2  BMI 29.62 kg/m2 29.62 kg/m2 29.12 kg/m2 30.19 kg/m2 30.12 kg/m2 30.3 kg/m2 30.65 kg/m2

## 2023-08-03 NOTE — Assessment & Plan Note (Signed)
 Hyperlipidemia:Low fat diet discussed and encouraged.   Lipid Panel  Lab Results  Component Value Date   CHOL 136 02/21/2023   HDL 45 02/21/2023   LDLCALC 67 02/21/2023   TRIG 135 02/21/2023   CHOLHDL 3.0 02/21/2023     Updated lab needed at/ before next visit.

## 2023-08-03 NOTE — Assessment & Plan Note (Signed)
 Reduce gabapenti from 500 mg daily to 300 mg daily

## 2023-08-03 NOTE — Assessment & Plan Note (Signed)
 Uncontrolled, inc zyrtec  to twice daily and add daily singulair

## 2023-08-03 NOTE — Patient Instructions (Addendum)
 Schedule AWV for after 09/13/23 when due   F/u in 4 . 5 months, call if you need me sooner  Pls get TdAP at your pharmacy today  Need shingles vaccines, this is also at your pharmacy  Keep skin of left knee clean and dry, topical antibitoic in area is good  Increase certrizine to twice daily, send message if allergy drainage still uncontrolled , I will add singulair, no need for ENT now  Reduce gabapentin  to one in am and two at night, and you are aLSO REFERRED TO pHYSICAL THERAPY DUE TO UNSTEADY GAIT AND RECURRENT FALLS  Fasting lipid, cmp and EGFr and vit D 3 to 5 days before next visit  Thanks for choosing  [facility], we consider it a privelige to serve you.

## 2023-08-03 NOTE — Assessment & Plan Note (Signed)
 Refer out pt PT twice weekly x 6 weeks

## 2023-08-04 ENCOUNTER — Ambulatory Visit (INDEPENDENT_AMBULATORY_CARE_PROVIDER_SITE_OTHER): Payer: Medicare HMO | Admitting: Psychiatry

## 2023-08-04 DIAGNOSIS — Z630 Problems in relationship with spouse or partner: Secondary | ICD-10-CM | POA: Diagnosis not present

## 2023-08-04 NOTE — Progress Notes (Signed)
 IN-PERSON  THERAPIST PROGRESS NOTE  Session Time:  Thursday 08/04/2023 1:10 PM - 2:00 PM   Participation Level: Active  Behavioral Response: CasualAlertAnxious  Type of Therapy: Individual Therapy  Treatment Goals addressed: Verbalize an understanding and resolution of current interpersonal problems Learn and implement conflict resolution skills to resolve interpersonal problems   ProgressTowards Goals: Progressing  Interventions: Supportive/CBT  Summary: Jennifer Blackburn is a 78 y.o. female who is referred for services by PCP Dr. Alberteen Huge due to pt experiencing stress. She denies any psychiatric hospitalizations. She is a returning pt to this clinician and last was seen in 2016.  Patient is resuming services as she reports increased stress triggered by health issues, increased fatigue, and marital issues.  Patient's current symptoms include fatigue, worry, and muscle tension.  Patient last was seen about 4 weeks  ago.  She reports continued stress regarding marital issues.  She expresses frustration as husband remains very critical, continues to make negative comments, and dismisses her feelings per her report.  She reports she no longer feels comfortable going to the senior center due to information she heard about her husband.  She expresses hurt and sadness.  She states trying to avoid having any conflict or confrontation with her husband as he will continue to argue regardless of what she may say. She continues to have support from her children and grandchildren.    Suicidal/Homicidal: Nowithout intent/plan  Therapist Response: Reviewed symptoms, discussed stressors, facilitated expression of thoughts and feelings, validated feelings, assisted patient examine the dynamics in relationship with husband using the equality wheel and the power/control wheel, reviewed rationale for patient to try to resume involvement  in positive relationships and sources of energy, encouraged patient  to engage in self nurturing activities  Plan: Return again in 2 weeks.  Diagnosis: Stress due to marital problems  Collaboration of Care: Primary Care Provider AEB patient sees PCP Dr. Alberteen Huge  Patient/Guardian was advised Release of Information must be obtained prior to any record release in order to collaborate their care with an outside provider. Patient/Guardian was advised if they have not already done so to contact the registration department to sign all necessary forms in order for us  to release information regarding their care.   Consent: Patient/Guardian gives verbal consent for treatment and assignment of benefits for services provided during this visit. Patient/Guardian expressed understanding and agreed to proceed.   Dicie Foster, LCSW 08/04/2023

## 2023-08-07 DIAGNOSIS — E1165 Type 2 diabetes mellitus with hyperglycemia: Secondary | ICD-10-CM | POA: Diagnosis not present

## 2023-09-05 ENCOUNTER — Other Ambulatory Visit: Payer: Self-pay | Admitting: Cardiovascular Disease

## 2023-09-06 DIAGNOSIS — E1165 Type 2 diabetes mellitus with hyperglycemia: Secondary | ICD-10-CM | POA: Diagnosis not present

## 2023-09-07 ENCOUNTER — Other Ambulatory Visit: Payer: Self-pay

## 2023-09-07 ENCOUNTER — Encounter (HOSPITAL_COMMUNITY): Payer: Self-pay

## 2023-09-07 ENCOUNTER — Ambulatory Visit (HOSPITAL_COMMUNITY): Attending: Family Medicine

## 2023-09-07 DIAGNOSIS — Z7409 Other reduced mobility: Secondary | ICD-10-CM | POA: Insufficient documentation

## 2023-09-07 DIAGNOSIS — W19XXXA Unspecified fall, initial encounter: Secondary | ICD-10-CM | POA: Insufficient documentation

## 2023-09-07 DIAGNOSIS — R29898 Other symptoms and signs involving the musculoskeletal system: Secondary | ICD-10-CM | POA: Diagnosis not present

## 2023-09-07 DIAGNOSIS — R2681 Unsteadiness on feet: Secondary | ICD-10-CM | POA: Diagnosis not present

## 2023-09-07 DIAGNOSIS — R296 Repeated falls: Secondary | ICD-10-CM | POA: Diagnosis not present

## 2023-09-07 DIAGNOSIS — R269 Unspecified abnormalities of gait and mobility: Secondary | ICD-10-CM | POA: Insufficient documentation

## 2023-09-07 NOTE — Therapy (Signed)
 OUTPATIENT PHYSICAL THERAPY LOWER EXTREMITY EVALUATION   Patient Name: Jennifer Blackburn MRN: [id number] DOB:[birth year], 78 y.o., female Today's Date: 09/07/2023  END OF SESSION:  PT End of Session - 09/07/23 1601     Visit Number 1    Date for PT Re-Evaluation 10/19/23    Authorization Type [employer]IC[employer] H[employer]    Authorization Time Period seeking auth    Authorization - Visit Number 0    Progress Note Due on Visit 10    PT Start Time 1515    PT Stop Time 1555    PT Time Calculation (min) 40 min          Past Medical History:  Diagnosis Date   Anxiety    Arthritis    CAD (coronary artery disease)    Depression    Diabetes mellitus type II    without complication   DJD (degenerative joint disease) of lumbar spine    Hypercholesteremia    Hyperlipidemia    Hypertension    benign    Hypothyroidism    Thyroid  cancer (HCC) 2001   Past Surgical History:  Procedure Laterality Date   ABDOMINAL HYSTERECTOMY     CARDIAC CATHETERIZATION     CATARACT EXTRACTION W/PHACO Right 07/17/2012   Procedure: CATARACT EXTRACTION PHACO AND INTRAOCULAR LENS PLACEMENT (IOC);  Surgeon: Anner Kill, MD;  Location: [facility];  Service: Ophthalmology;  Laterality: Right;  CDE:25.51   CATARACT EXTRACTION W/PHACO Left 09/17/2016   Procedure: CATARACT EXTRACTION PHACO AND INTRAOCULAR LENS PLACEMENT LEFT EYE;  Surgeon: Anner Kill, MD;  Location: [facility];  Service: Ophthalmology;  Laterality: Left;  CDE: 19.23   COLONOSCOPY     COLONOSCOPY N/A 01/15/2014   Procedure: COLONOSCOPY;  Surgeon: Suzette Espy, MD;  Location: [facility];  Service: Endoscopy;  Laterality: N/A;  9:00 AM   COLONOSCOPY WITH PROPOFOL  N/A 01/25/2019   Procedure: COLONOSCOPY WITH PROPOFOL ;  Surgeon: Suzette Espy, MD; diverticulosis in the entire colon, otherwise normal exam.  No repeat colonoscopy due to age.   DOPPLER ECHOCARDIOGRAPHY     ESOPHAGOGASTRODUODENOSCOPY (EGD) WITH PROPOFOL  N/A 01/25/2019   Procedure:  ESOPHAGOGASTRODUODENOSCOPY (EGD) WITH PROPOFOL ;  Surgeon: Suzette Espy, MD; normal esophagus without dilation due to inability to pass dilator beyond the hypopharynx, 1 small nonbleeding gastric ulcer s/p biopsied, otherwise normal exam.  Pathology with ulcer with reactive changes, no H. pylori, metaplasia, dysplasia, or malignancy.   ESOPHAGOGASTRODUODENOSCOPY (EGD) WITH PROPOFOL  N/A 08/21/2019   Procedure: ESOPHAGOGASTRODUODENOSCOPY (EGD) WITH PROPOFOL ;  Surgeon: Suzette Espy, MD; Normal esophagus. Previously noted gastric ulcer completely healed. Normal examined duodenum.    JOINT REPLACEMENT  07/01/2010   left hip   KNEE SURGERY Right    arthroscopy   left hip replaced  07/01/2010   SPINE SURGERY  2006   cervical   stress dipyridamole myocardial perfusion     THYROIDECTOMY     TONSILLECTOMY     VESICOVAGINAL FISTULA CLOSURE W/ TAH     Patient Active Problem List   Diagnosis Date Noted   Recurrent falls 08/03/2023   Unsteady gait 08/03/2023   Abdominal pain 05/04/2023   Depression, major, single episode, in partial remission (HCC) 03/23/2023   Encounter for annual physical exam 12/30/2022   Stress at home 10/26/2022   Diarrhea of presumed infectious origin 06/06/2022   At risk for abnormal blood glucose level 06/06/2022   Neck pain with history of cervical spinal surgery 12/16/2021   Fall 12/01/2021   Left knee pain 01/25/2021   Constipation  06/06/2020   Leg swelling 11/18/2019   Pruritus 11/18/2019   Muscle pain 02/07/2019   Dysphagia 11/11/2018   Overweight (BMI 25.0-29.9) 11/05/2018   Cervical spondylosis with radiculopathy 06/20/2017   Hyperlipidemia    CAD (coronary artery disease)    Anxiety    Osteoarthritis 02/17/2017   Family history of coronary artery disease in father 02/17/2017   Lumbar spondylosis with myelopathy 07/18/2016   Laryngopharyngeal reflux (LPR) 06/24/2016   Type 2 diabetes mellitus with other specified complication (HCC) 01/13/2015   GERD  (gastroesophageal reflux disease) 04/11/2014   At high risk for falls 12/31/2013   Postsurgical hypothyroidism 01/05/2013   Back pain with radiculopathy 11/28/2012   Vitamin D  deficiency 10/14/2010   Bilateral carotid bruits 10/13/2010   Allergic rhinitis 11/23/2007   Primary hypertension 04/14/2007   Coronary atherosclerosis 04/14/2007   Headache 04/14/2007   Thyroid  cancer (HCC) 03/23/1999    PCP: Towanda Fret, MD  REFERRING PROVIDER: Towanda Fret, MD  REFERRING DIAG: W19.Benny Braver (ICD-10-CM) - Fall, initial encounter R29.6 (ICD-10-CM) - Recurrent falls R26.81 (ICD-10-CM) - Unsteady gait  THERAPY DIAG:  Impaired functional mobility, balance, gait, and endurance  Abnormal gait  Decreased strength of lower extremity  Rationale for Evaluation and Treatment: Rehabilitation  ONSET DATE: Fall occurred about 3 weeks ago, middle of May  SUBJECTIVE:   SUBJECTIVE STATEMENT: Pt states she was out looking for flowers at the store and was reaching for flowers and fell into [place]. Pt states no one was around and she fell again after trying to get up. Eventually got up on her own and went straight home. Pt states she has had about 5-6 falls, couple at home, one in [place] , one [place]. Pt states she feels a little nervous about falling. Pt states she has been staying in more often due to fear of falling, low back pain and hip pain.  PERTINENT HISTORY: L hip replacement, about 10 years ago Diabetes, medicated PAIN:  Are you having pain? No, occasional back pain, stooping over is problematic   PRECAUTIONS: Fall  RED FLAGS: Occasional stomach pain after taking certain medication   WEIGHT BEARING RESTRICTIONS: No  FALLS:  Has patient fallen in last 6 months? Yes. Number of falls 2  LIVING ENVIRONMENT: Lives with: lives with their spouse Lives in: House/apartment Stairs: Yes: Internal: 15 steps; on right going up and External: 2, 5 steps; on right  going up Has following equipment at home: Single point cane and has not been using it recently  OCCUPATION: retired  PLOF: Independent  PATIENT GOALS: decreased fear of falling, increased activity tolerance, increased LE strength  NEXT MD VISIT: none reported  OBJECTIVE:  Note: Objective measures were completed at Evaluation unless otherwise noted.  DIAGNOSTIC FINDINGS: N/A  PATIENT SURVEYS:  ABC scale 1280/1600  COGNITION: Overall cognitive status: Within functional limits for tasks assessed     SENSATION: WFL    LOWER EXTREMITY ROM:  Active ROM Right eval Left eval  Hip flexion    Hip extension    Hip abduction    Hip adduction    Hip internal rotation    Hip external rotation    Knee flexion    Knee extension    Ankle dorsiflexion    Ankle plantarflexion    Ankle inversion    Ankle eversion     (Blank rows = not tested)  LOWER EXTREMITY MMT:  MMT Right eval Left eval  Hip flexion 3+ 3+  Hip extension 3 3  Hip abduction 3+ 3+  Hip adduction 3+ 3+  Hip internal rotation    Hip external rotation    Knee flexion 4 4  Knee extension 4 4  Ankle dorsiflexion 3+ 3+  Ankle plantarflexion    Ankle inversion    Ankle eversion     (Blank rows = not tested)   FUNCTIONAL TESTS:  5 times sit to stand: 16.79 seconds Timed up and go (TUG): 11.11 seconds SLS 09/07/23: R: 15.69s L: 8.98s  GAIT: Distance walked: 75 feet to and from treatment area Assistive device utilized: None Level of assistance: Complete Independence Comments: Pt demonstrates decreased gait speed and stride length bilaterally.                                                                                                                                 TREATMENT DATE:  09/07/2023   Evaluation: -ROM measured, Strength assessed, HEP prescribed, pt educated on prognosis, findings, and importance of HEP compliance if given.        PATIENT EDUCATION:  Education details: Pt was  educated on findings of PT evaluation, prognosis, frequency of therapy visits and rationale, attendance policy, and HEP if given.   Person educated: Patient Education method: Explanation, Verbal cues, and Handouts Education comprehension: verbalized understanding, verbal cues required, and needs further education  HOME EXERCISE PROGRAM: Access Code: [email or web address] URL: [email or web address] Date: 09/07/2023 Prepared by: Armond Bertin  Exercises - Supine Bridge  - 1 x daily - 7 x weekly - 3 sets - 10 reps - Sit to Stand Without Arm Support  - 1 x daily - 7 x weekly - 3 sets - 10 reps - Standing Single Leg Stance with Counter Support  - 1 x daily - 7 x weekly - 1 sets - 3 reps - 30 seconds hold  ASSESSMENT:  CLINICAL IMPRESSION: Patient is a 78 y.o. female who was seen today for physical therapy evaluation and treatment for W19.Benny Braver (ICD-10-CM) - Fall, initial encounter R29.6 (ICD-10-CM) - Recurrent falls R26.81 (ICD-10-CM) - Unsteady gait.   Patient demonstrates decreased BLE strength, abnormal gait, and impaired balance. Patient also demonstrates difficulty with ambulation during today's session with decreased stride length and velocity noted. Patient also demonstrates decreased SLS ability bilaterally left worse than right. Patient requires education on role of PT, HEP and importance of HEP compliance. Patient would benefit from skilled physical therapy for increased endurance with ambulation, increased LE strength, and balance for improved gait quality, return to higher level of function with ADLs, and progress towards therapy goals.   OBJECTIVE IMPAIRMENTS: Abnormal gait, decreased balance, decreased endurance, decreased strength, and pain.   ACTIVITY LIMITATIONS: carrying, lifting, standing, stairs, transfers, and locomotion level  PARTICIPATION LIMITATIONS: shopping, community activity, and yard work  PERSONAL FACTORS: Age, Past/current experiences, Time since onset of  injury/illness/exacerbation, and 1 comorbidity: history of falls are also affecting patient's functional outcome.   REHAB POTENTIAL: Fair history of falling  CLINICAL  DECISION MAKING: Stable/uncomplicated  EVALUATION COMPLEXITY: Low   GOALS: Goals reviewed with patient? No  SHORT TERM GOALS: Target date: 09/28/23  Pt will be independent with HEP in order to demonstrate participation in Physical Therapy POC.  Baseline: Goal status: INITIAL  2.  Pt will improve ABC score by 15% in order to demonstrate improved pain with functional goals and outcomes. Baseline: see objective Goal status: INITIAL  LONG TERM GOALS: Target date: 10/19/23  Pt will increase SLS time to at least 20 seconds on each LE in order to demonstrate improved balance with ADL/mobility.   Baseline: see objective.  Goal status: INITIAL  2.  Pt will improve 2 MWT by 40 in order to demonstrate improved functional mobility capacity in community setting.  Baseline: see objective.  Goal status: INITIAL  3.  Pt will improve ABC score by 30% from original score in order to demonstrate improved pain with functional goals and outcomes. Baseline: see objective.  Goal status: INITIAL  4.  Pt will improve 5TSTS time by at least 2.3 seconds in order to demonstrate improved functional strength during functional transfers.  Baseline: see objective.  Goal status: INITIAL    PLAN:  PT FREQUENCY: 1-2x/week  PT DURATION: 6 weeks  PLANNED INTERVENTIONS: 97110-Therapeutic exercises, 97530- Therapeutic activity, 97112- Neuromuscular re-education, 97535- Self Care, 16109- Manual therapy, [id number]- Gait training, Patient/Family education, Balance training, Stair training, and DME instructions  PLAN FOR NEXT SESSION: , FGA, progress balance and LE strengthening   Armond Bertin, PT, DPT [facility] Office: [phone] 4:04 PM, 09/07/23  [employer] Auth Request  Referring diagnosis code (ICD 10)?  W19.Benny Braver (ICD-10-CM) - Fall, initial encounter R29.6 (ICD-10-CM) - Recurrent falls R26.81 (ICD-10-CM) - Unsteady gait Treatment diagnosis codes (ICD 10)? (if different than referring diagnosis)  Z74.09 ; R26.9 ; R29.898  What was this (referring dx) caused by? []  Surgery [x]  Fall []  Ongoing issue []  Arthritis []  Other: ____________  Laterality: []  Rt []  Lt []  Both  Deficits: [x]  Pain []  Stiffness [x]  Weakness []  Edema [x]  Balance Deficits []  Coordination [x]  Gait Disturbance []  ROM []  Other   Functional Tool Score: ABC scale 1280/1600  CPT codes: See Planned Interventions listed in the Plan section of the Evaluation.

## 2023-09-08 ENCOUNTER — Ambulatory Visit: Payer: Self-pay

## 2023-09-08 NOTE — Telephone Encounter (Signed)
 FYI Only or Action Required?: FYI only for provider.  Patient was last seen in primary care on 08/03/2023 by Towanda Fret, MD. Called Nurse Triage reporting Epistaxis. Symptoms began several weeks ago. Interventions attempted: OTC medications: Nasal spray. Symptoms are: gradually worsening.  Triage Disposition: See Physician Within 24 Hours (overriding See PCP Within 2 Weeks)  Patient/caregiver understands and will follow disposition?: Yes  Copied from CRM [id number]. Topic: Clinical - Red Word Triage >> Sep 08, 2023 11:38 AM Lotus Round B wrote: [facility] that prompted transfer to Nurse Triage: nose bleeds Reason for Disposition  Hard-to-stop nosebleeds are a chronic symptom (recurrent or ongoing AND present > 4 weeks)  Answer Assessment - Initial Assessment Questions 1. AMOUNT OF BLEEDING: How bad is the bleeding? How much blood was lost? Has the bleeding stopped?   - MILD: needed a couple tissues   - MODERATE: needed many tissues   - SEVERE: large blood clots, soaked many tissues, lasted more than 30 minutes      Severe 2. ONSET: When did the nosebleed start?      Two weeks ago 3. FREQUENCY: How many nosebleeds have you had in the last 24 hours?      Upon waking this morning, lasted 30 minutes. Currently not bleeding 4. RECURRENT SYMPTOMS: Have there been other recent nosebleeds? If Yes, ask: How long did it take you to stop the bleeding? What worked best?      Yes, up to one hour last week 5. CAUSE: What do you think caused this nosebleed?     Unsure, maybe sinuses  6. LOCAL FACTORS: Do you have any cold symptoms?, Have you been rubbing or picking at your nose?     No 7. SYSTEMIC FACTORS: Do you have high blood pressure or any bleeding problems?     Yes, but controlled 8. BLOOD THINNERS: Do you take any blood thinners? (e.g., aspirin , clopidogrel / Plavix, coumadin, heparin). Notes: Other strong blood thinners include: Arixtra (fondaparinux), Eliquis  (apixaban), Pradaxa (dabigatran), and Xarelto (rivaroxaban).     No 9. OTHER SYMPTOMS: Do you have any other symptoms? (e.g., lightheadedness) Denies.         Additional info: Frequency increasing, never evaluated. Caller is worried. No appointments in office until [facility] July, will proceed to urgent care.  Protocols used: Nosebleed-A-AH

## 2023-09-14 ENCOUNTER — Ambulatory Visit: Payer: Medicare HMO

## 2023-09-14 VITALS — Ht 65.0 in | Wt 175.0 lb

## 2023-09-14 DIAGNOSIS — Z0000 Encounter for general adult medical examination without abnormal findings: Secondary | ICD-10-CM | POA: Diagnosis not present

## 2023-09-14 DIAGNOSIS — Z780 Asymptomatic menopausal state: Secondary | ICD-10-CM | POA: Diagnosis not present

## 2023-09-14 NOTE — Patient Instructions (Signed)
 Jennifer Blackburn ,  Thank you for taking time out of your busy schedule to complete your Annual Wellness Visit with me. I enjoyed our conversation and look forward to speaking with you again next year. I, as well as your care team,  appreciate your ongoing commitment to your health goals. Please review the following plan we discussed and let me know if I can assist you in the future.  I enjoyed our conversation and look forward to it again next year. Blessing for the upcoming year!!  -Dacoda Spallone  Your Game plan/ To Do List    Referrals Placed: Osteoporosis Screening: Please call the number below to schedule your appt. [facility] Phone: [phone]  Follow up Visits:  Next appointment with PCP: July, 23, 2025 at 9:00 am  Medicare AWV with health advisor: September 17, 2024 at 3:10 pm video    Clinician Recommendations:  Aim for 30 minutes of exercise or brisk walking, 6-8 glasses of water , and 5 servings of fruits and vegetables each day.       This is a list of the screening recommended for you and due dates:  Health Maintenance  Topic Date Due   Zoster (Shingles) Vaccine (1 of 2) Never done   DTaP/Tdap/Td vaccine (2 - Td or Tdap) 12/20/2020   DEXA scan (bone density measurement)  08/13/2021   COVID-19 Vaccine (4 - 2024-25 season) 11/21/2022   Eye exam for diabetics  04/09/2023   Complete foot exam   10/11/2023   Flu Shot  10/21/2023   Hemoglobin A1C  12/30/2023   Mammogram  02/21/2024   Yearly kidney function blood test for diabetes  05/03/2024   Yearly kidney health urinalysis for diabetes  06/29/2024   Medicare Annual Wellness Visit  09/13/2024   Pneumococcal Vaccine for age over 32  Completed   Hepatitis C Screening  Completed   Hepatitis B Vaccine  Aged Out   HPV Vaccine  Aged Out   Meningitis B Vaccine  Aged Out   Colon Cancer Screening  Discontinued    Advanced directives: (Provided) Advance directive discussed with you today. I have provided a copy for  you to complete at home and have notarized. Once this is complete, please bring a copy in to our office so we can scan it into your chart.  Advance Care Planning is important because it:  [x]  Makes sure you receive the medical care that is consistent with your values, goals, and preferences  [x]  It provides guidance to your family and loved ones and reduces their decisional burden about whether or not they are making the right decisions based on your wishes.  Follow the link provided in your after visit summary or read over the paperwork we have mailed to you to help you started getting your Advance Directives in place. If you need assistance in completing these, please reach out to us  so that we can help you!  Understanding Your Risk for Falls Millions of people have serious injuries from falls each year. It is important to understand your risk of falling. Talk with your health care provider about your risk and what you can do to lower it. If you do have a serious fall, make sure to tell your provider. Falling once raises your risk of falling again. How can falls affect me? Serious injuries from falls are common. These include: Broken bones, such as hip fractures. Head injuries, such as traumatic brain injuries (TBI) or concussions. A fear of falling can  cause you to avoid activities and stay at home. This can make your muscles weaker and raise your risk for a fall. What can increase my risk? There are a number of risk factors that increase your risk for falling. The more risk factors you have, the higher your risk of falling. Serious injuries from a fall happen most often to people who are older than 79 years old. Teenagers and young adults ages 51-29 are also at higher risk. Common risk factors include: Weakness in the lower body. Being generally weak or confused due to long-term (chronic) illness. Dizziness or balance problems. Poor vision. Medicines that cause dizziness or drowsiness.  These may include: Medicines for your blood pressure, heart, anxiety, insomnia, or swelling (edema). Pain medicines. Muscle relaxants. Other risk factors include: Drinking alcohol. Having had a fall in the past. Having foot pain or wearing improper footwear. Working at a dangerous job. Having any of the following in your home: Tripping hazards, such as floor clutter or loose rugs. Poor lighting. Pets. Having dementia or memory loss. What actions can I take to lower my risk of falling?     Physical activity Stay physically fit. Do strength and balance exercises. Consider taking a regular class to build strength and balance. Yoga and tai chi are good options. Vision Have your eyes checked every year and your prescription for glasses or contacts updated as needed. Shoes and walking aids Wear non-skid shoes. Wear shoes that have rubber soles and low heels. Do not wear high heels. Do not walk around the house in socks or slippers. Use a cane or walker as told by your provider. Home safety Attach secure railings on both sides of your stairs. Install grab bars for your bathtub, shower, and toilet. Use a non-skid mat in your bathtub or shower. Attach bath mats securely with double-sided, non-slip rug tape. Use good lighting in all rooms. Keep a flashlight near your bed. Make sure there is a clear path from your bed to the bathroom. Use night-lights. Do not use throw rugs. Make sure all carpeting is taped or tacked down securely. Remove all clutter from walkways and stairways, including extension cords. Repair uneven or broken steps and floors. Avoid walking on icy or slippery surfaces. Walk on the grass instead of on icy or slick sidewalks. Use ice melter to get rid of ice on walkways in the winter. Use a cordless phone. Questions to ask your health care provider Can you help me check my risk for a fall? Do any of my medicines make me more likely to fall? Should I take a vitamin D   supplement? What exercises can I do to improve my strength and balance? Should I make an appointment to have my vision checked? Do I need a bone density test to check for weak bones (osteoporosis)? Would it help to use a cane or a walker? Where to find more information Centers for Disease Control and Prevention, STEAD[employer]: TonerPromos.no Community-Based Fall Prevention Programs: TonerPromos.no [employer] on Aging: BaseRingTones.pl Contact a health care provider if: You fall at home. You are afraid of falling at home. You feel weak, drowsy, or dizzy. This information is not intended to replace advice given to you by your health care provider. Make sure you discuss any questions you have with your health care provider. Document Revised: 11/09/2021 Document Reviewed: 11/09/2021 Elsevier Patient Education  2024 [employer].

## 2023-09-14 NOTE — Progress Notes (Signed)
 Subjective:   Jennifer Blackburn is a 78 y.o. who presents for a Medicare Wellness preventive visit.  As a reminder, Annual Wellness Visits don't include a physical exam, and some assessments may be limited, especially if this visit is performed virtually. We may recommend an in-person follow-up visit with your provider if needed.  Visit Complete: Virtual I connected with  Jennifer Blackburn on 09/14/23 by a audio enabled telemedicine application and verified that I am speaking with the correct person using two identifiers.  Patient Location: Home  Provider Location: Home Office  I discussed the limitations of evaluation and management by telemedicine. The patient expressed understanding and agreed to proceed.  Vital Signs: Because this visit was a virtual/telehealth visit, some criteria may be missing or patient reported. Any vitals not documented were not able to be obtained and vitals that have been documented are patient reported.  VideoDeclined- This patient declined Librarian, academic. Therefore the visit was completed with audio only.  Persons Participating in Visit: Patient.  AWV Questionnaire: No: Patient Medicare AWV questionnaire was not completed prior to this visit.  Cardiac Risk Factors include: advanced age (>1men, >34 women);diabetes mellitus;dyslipidemia;hypertension     Objective:    Today's Vitals   09/14/23 1454  Weight: 175 lb (79.4 kg)  Height: 5' 5 (1.651 m)   Body mass index is 29.12 kg/m.     09/14/2023    2:48 PM 09/07/2023    3:19 PM 05/28/2022    4:11 PM 05/25/2022    2:13 AM 08/31/2021    2:36 PM 08/06/2020    1:58 PM 02/06/2020    2:59 PM  Advanced Directives  Does Patient Have a Medical Advance Directive? No No No No No No No  Would patient like information on creating a medical advance directive? Yes (MAU/Ambulatory/Procedural Areas - Information given) Yes (MAU/Ambulatory/Procedural Areas - Information given)   No -  Patient declined No - Patient declined Yes (MAU/Ambulatory/Procedural Areas - Information given)    Current Medications (verified) Outpatient Encounter Medications as of 09/14/2023  Medication Sig   Cetirizine  HCl 10 MG TBDP Take one tablet by mouth two times daily for allergy   Cholecalciferol (VITAMIN D3) 50 MCG (2000 UT) TABS Take 2,000 Units by mouth daily.   ezetimibe  (ZETIA ) 10 MG tablet TAKE 1 TABLET BY MOUTH EVERY DAY   gabapentin  (NEURONTIN ) 100 MG capsule Take one capsule  in the morning and two a bedtime   hydrOXYzine  (ATARAX ) 10 MG tablet Take one tablet twice daily and two tablets at bedtime as needed for generalized itching.   imipramine  (TOFRANIL ) 50 MG tablet Take 1 tablet (50 mg total) by mouth at bedtime.   insulin  aspart (NOVOLOG  FLEXPEN) 100 UNIT/ML FlexPen Inject 18 Units into the skin 3 (three) times daily with meals.   insulin  glargine, 1 Unit Dial , (TOUJEO  SOLOSTAR) 300 UNIT/ML Solostar Pen INJECT 30-50 UNITS TWO TIMES DAILY   isosorbide  mononitrate (IMDUR ) 30 MG 24 hr tablet TAKE 1/2 TABLET BY MOUTH DAILY   levothyroxine  (SYNTHROID ) 112 MCG tablet TAKE 1 TABLET BY MOUTH EVERY DAY BEFORE BREAKFAST   metFORMIN  (GLUCOPHAGE ) 1000 MG tablet Take 1 tablet (1,000 mg total) by mouth 2 (two) times daily.   metoprolol  tartrate (LOPRESSOR ) 50 MG tablet TAKE 1 TABLET BY MOUTH TWICE A DAY   pantoprazole  (PROTONIX ) 40 MG tablet TAKE 1 TABLET BY MOUTH EVERY DAY   PARoxetine  (PAXIL ) 20 MG tablet TAKE 1 TABLET EVERY DAY   polyethylene glycol powder (GLYCOLAX /MIRALAX ) 17  GM/SCOOP powder Take 1 Container by mouth daily as needed.   pravastatin  (PRAVACHOL ) 80 MG tablet TAKE 1 TABLET EVERY DAY   Semaglutide , 2 MG/DOSE, 8 MG/3ML SOPN Inject 2 mg as directed once a week.   ACCU-CHEK GUIDE test strip USE AS INSTRUCTED TO CHECK BLOOD SUGAR 2 TIMES PER DAY DX CODE E11.65 (Patient not taking: Reported on 09/14/2023)   Insulin  Pen Needle 32G X 4 MM MISC Use 4x a day (Patient not taking: Reported  on 09/14/2023)   Lancets (ACCU-CHEK MULTICLIX) lancets Use as instructed three times daily dx 250.01 (Patient not taking: Reported on 09/14/2023)   [DISCONTINUED] insulin  lispro (HUMALOG  KWIKPEN) 100 UNIT/ML KwikPen Inject 22 Units into the skin 3 (three) times daily. (Patient not taking: Reported on 09/14/2023)   No facility-administered encounter medications on file as of 09/14/2023.    Allergies (verified) Lipitor [atorvastatin  calcium ], Actos [pioglitazone], Crestor  [rosuvastatin ], Daypro [oxaprozin], Nsaids, and Sulfonamide derivatives   History: Past Medical History:  Diagnosis Date   Anxiety    Arthritis    CAD (coronary artery disease)    Depression    Diabetes mellitus type II    without complication   DJD (degenerative joint disease) of lumbar spine    Hypercholesteremia    Hyperlipidemia    Hypertension    benign    Hypothyroidism    Thyroid  cancer (HCC) 2001   Past Surgical History:  Procedure Laterality Date   ABDOMINAL HYSTERECTOMY     CARDIAC CATHETERIZATION     CATARACT EXTRACTION W/PHACO Right 07/17/2012   Procedure: CATARACT EXTRACTION PHACO AND INTRAOCULAR LENS PLACEMENT (IOC);  Surgeon: Cherene Mania, MD;  Location: [facility];  Service: Ophthalmology;  Laterality: Right;  CDE:25.51   CATARACT EXTRACTION W/PHACO Left 09/17/2016   Procedure: CATARACT EXTRACTION PHACO AND INTRAOCULAR LENS PLACEMENT LEFT EYE;  Surgeon: Mania Cherene, MD;  Location: [facility];  Service: Ophthalmology;  Laterality: Left;  CDE: 19.23   COLONOSCOPY     COLONOSCOPY N/A 01/15/2014   Procedure: COLONOSCOPY;  Surgeon: Lamar CHRISTELLA Hollingshead, MD;  Location: [facility];  Service: Endoscopy;  Laterality: N/A;  9:00 AM   COLONOSCOPY WITH PROPOFOL  N/A 01/25/2019   Procedure: COLONOSCOPY WITH PROPOFOL ;  Surgeon: Hollingshead Lamar CHRISTELLA, MD; diverticulosis in the entire colon, otherwise normal exam.  No repeat colonoscopy due to age.   DOPPLER ECHOCARDIOGRAPHY     ESOPHAGOGASTRODUODENOSCOPY (EGD) WITH PROPOFOL  N/A  01/25/2019   Procedure: ESOPHAGOGASTRODUODENOSCOPY (EGD) WITH PROPOFOL ;  Surgeon: Hollingshead Lamar CHRISTELLA, MD; normal esophagus without dilation due to inability to pass dilator beyond the hypopharynx, 1 small nonbleeding gastric ulcer s/p biopsied, otherwise normal exam.  Pathology with ulcer with reactive changes, no H. pylori, metaplasia, dysplasia, or malignancy.   ESOPHAGOGASTRODUODENOSCOPY (EGD) WITH PROPOFOL  N/A 08/21/2019   Procedure: ESOPHAGOGASTRODUODENOSCOPY (EGD) WITH PROPOFOL ;  Surgeon: Hollingshead Lamar CHRISTELLA, MD; Normal esophagus. Previously noted gastric ulcer completely healed. Normal examined duodenum.    JOINT REPLACEMENT  07/01/2010   left hip   KNEE SURGERY Right    arthroscopy   left hip replaced  07/01/2010   SPINE SURGERY  2006   cervical   stress dipyridamole myocardial perfusion     THYROIDECTOMY     TONSILLECTOMY     VESICOVAGINAL FISTULA CLOSURE W/ TAH     Family History  Problem Relation Age of Onset   Heart attack Father    Heart failure Mother    Asthma Daughter    Sleep apnea Son        CPAP   Colon cancer  Neg Hx    Social History   Socioeconomic History   Marital status: Married    Spouse name: Not on file   Number of children: 2   Years of education: 14   Highest education level: Some college, no degree  Occupational History   Occupation: retired     Comment: bank  Tobacco Use   Smoking status: Never    Passive exposure: Yes   Smokeless tobacco: Never   Tobacco comments:    Husband smokes in the home  Vaping Use   Vaping status: Never Used  Substance and Sexual Activity   Alcohol use: No   Drug use: No   Sexual activity: Not Currently  Other Topics Concern   Not on file  Social History Narrative   Lives with husband, at home   No caffeine   Social Drivers of Corporate investment banker Strain: Low Risk  (09/14/2023)   Overall Financial Resource Strain (CARDIA)    Difficulty of Paying Living Expenses: Not hard at all  Food Insecurity: No Food  Insecurity (09/14/2023)   Hunger Vital Sign    Worried About Running Out of Food in the Last Year: Never true    Ran Out of Food in the Last Year: Never true  Transportation Needs: No Transportation Needs (09/14/2023)   PRAPARE - Administrator, Civil Service (Medical): No    Lack of Transportation (Non-Medical): No  Physical Activity: Insufficiently Active (09/14/2023)   Exercise Vital Sign    Days of Exercise per Week: 4 days    Minutes of Exercise per Session: 30 min  Stress: No Stress Concern Present (09/14/2023)   [employer] of Occupational Health - Occupational Stress Questionnaire    Feeling of Stress: Not at all  Social Connections: Socially Integrated (09/14/2023)   Social Connection and Isolation Panel    Frequency of Communication with Friends and Family: More than three times a week    Frequency of Social Gatherings with Friends and Family: Twice a week    Attends Religious Services: More than 4 times per year    Active Member of [employer] or Organizations: Yes    Attends Engineer, structural: More than 4 times per year    Marital Status: Married    Tobacco Counseling Counseling given: Yes Tobacco comments: Husband smokes in the home    Clinical Intake:  Pre-visit preparation completed: Yes  Pain : No/denies pain     BMI - recorded: 29.12 Nutritional Risks: None Diabetes: No  Lab Results  Component Value Date   HGBA1C 8.8 (A) 06/30/2023   HGBA1C 9.5 (A) 03/03/2023   HGBA1C 11.2 10/11/2022     How often do you need to have someone help you when you read instructions, pamphlets, or other written materials from your doctor or pharmacy?: 1 - Never  Interpreter Needed?: No  Information entered by :: Stefano ORN CMA   Activities of Daily Living     09/14/2023    3:12 PM  In your present state of health, do you have any difficulty performing the following activities:  Hearing? 0  Vision? 0  Difficulty concentrating or making decisions?  0  Walking or climbing stairs? 0  Dressing or bathing? 0  Doing errands, shopping? 0  Preparing Food and eating ? N  Using the Toilet? N  In the past six months, have you accidently leaked urine? N  Do you have problems with loss of bowel control? N  Managing your Medications?  N  Managing your Finances? N  Housekeeping or managing your Housekeeping? N    Patient Care Team: [name], MD as PCP - Diedre Jarold Mayo, MD (Ophthalmology) Court Dorn PARAS, MD as Consulting Physician (Cardiology) Christi, Vannie PARAS, MD as Attending Physician (Endocrinology) Shaaron Lamar HERO, MD as Consulting Physician (Gastroenterology) Lenis Ethelle ORN, MD as Consulting Physician (Endocrinology) Trixie File, MD as Consulting Physician (Endocrinology) Dante Winton BRAVO, LCSW as Social Worker (Psychiatry) Darroll Anes, DO as Consulting Physician (Optometry)  I have updated your Care Teams any recent Medical Services you may have received from other providers in the past year.     Assessment:   This is a routine wellness examination for Jennifer Blackburn.  Hearing/Vision screen Hearing Screening - Comments:: Patient denies any hearing difficulties.   Vision Screening - Comments:: Wears rx glasses - up to date with routine eye exams with  Anes Darroll w/ [facility] [place] Location   Goals Addressed             This Visit's Progress    Patient Stated   On track    Keep A1C down. Prevent falls      Patient Stated       I want to get my A1C below a 7 and spend more time with family and grandchildren       Depression Screen     09/14/2023    5:47 PM 08/03/2023    3:02 PM 03/22/2023    3:04 PM 12/30/2022    1:36 PM 12/22/2022    1:14 PM 10/26/2022    2:42 PM 10/26/2022    2:13 PM  PHQ 2/9 Scores  PHQ - 2 Score 0 0 0 0  0 0  PHQ- 9 Score 0     4 4     Information is confidential and restricted. Go to Review Flowsheets to unlock data.    Fall Risk     09/14/2023     3:11 PM 08/03/2023    3:02 PM 03/22/2023    3:04 PM 12/30/2022    1:36 PM 12/27/2022    1:18 PM  Fall Risk   Falls in the past year? 1 1 0 0 0  Number falls in past yr: 1 0 0 0 0  Injury with Fall? 1 1 0 0 0  Risk for fall due to : History of fall(s);Impaired balance/gait;Orthopedic patient;Impaired mobility  No Fall Risks No Fall Risks   Follow up Falls evaluation completed;Education provided;Falls prevention discussed Falls evaluation completed Falls evaluation completed Falls evaluation completed     MEDICARE RISK AT HOME:  Medicare Risk at Home Any stairs in or around the home?: Yes If so, are there any without handrails?: No Home free of loose throw rugs in walkways, pet beds, electrical cords, etc?: Yes Adequate lighting in your home to reduce risk of falls?: Yes Life alert?: No Use of a cane, walker or w/c?: No Grab bars in the bathroom?: Yes Shower chair or bench in shower?: No Elevated toilet seat or a handicapped toilet?: Yes  TIMED UP AND GO:  Was the test performed?  No  Cognitive Function: 6CIT completed    08/31/2021    2:38 PM  MMSE - Mini Mental State Exam  Not completed: Unable to complete        09/14/2023    5:46 PM 09/13/2022    2:43 PM 08/31/2021    2:38 PM 08/06/2019    2:42 PM 08/02/2018    9:49 AM  6CIT Screen  What Year? 0 points 0 points 0 points 0 points 0 points  What month? 0 points 0 points 0 points 0 points 0 points  What time? 0 points 0 points 0 points 0 points 0 points  Count back from 20 0 points 0 points 0 points 0 points 0 points  Months in reverse 0 points 0 points 0 points 0 points 0 points  Repeat phrase 0 points 0 points 0 points 0 points 0 points  Total Score 0 points 0 points 0 points 0 points 0 points    Immunizations Immunization History  Administered Date(s) Administered   Fluad Quad(high Dose 65+) 12/18/2018, 12/25/2019, 01/21/2021, 12/16/2021   Fluad Trivalent(High Dose 65+) 12/30/2022   Influenza Split 03/10/2012    Influenza Whole 01/04/2007   Influenza,inj,Quad PF,6+ Mos 11/28/2012, 12/31/2013, 01/31/2017, 01/10/2018   Influenza,inj,quad, With Preservative 12/20/2016   Influenza-Unspecified 02/09/2016   Moderna SARS-COV2 Booster Vaccination 01/24/2020   Moderna Sars-Covid-2 Vaccination 04/29/2019, 05/30/2019, 01/03/2021   Pneumococcal Conjugate-13 07/15/2014   Pneumococcal Polysaccharide-23 02/17/2004, 09/07/2012   Tdap 12/21/2010    Screening Tests Health Maintenance  Topic Date Due   Zoster Vaccines- Shingrix (1 of 2) Never done   DTaP/Tdap/Td (2 - Td or Tdap) 12/20/2020   DEXA SCAN  08/13/2021   COVID-19 Vaccine (4 - 2024-25 season) 11/21/2022   OPHTHALMOLOGY EXAM  04/09/2023   FOOT EXAM  10/11/2023   INFLUENZA VACCINE  10/21/2023   HEMOGLOBIN A1C  12/30/2023   MAMMOGRAM  02/21/2024   Diabetic kidney evaluation - eGFR measurement  05/03/2024   Diabetic kidney evaluation - Urine ACR  06/29/2024   Medicare Annual Wellness (AWV)  09/13/2024   Pneumococcal Vaccine: 50+ Years  Completed   Hepatitis C Screening  Completed   Hepatitis B Vaccines  Aged Out   HPV VACCINES  Aged Out   Meningococcal B Vaccine  Aged Out   Colonoscopy  Discontinued    Health Maintenance  Health Maintenance Due  Topic Date Due   Zoster Vaccines- Shingrix (1 of 2) Never done   DTaP/Tdap/Td (2 - Td or Tdap) 12/20/2020   DEXA SCAN  08/13/2021   COVID-19 Vaccine (4 - 2024-25 season) 11/21/2022   OPHTHALMOLOGY EXAM  04/09/2023   Health Maintenance Items Addressed: DEXA ordered  Additional Screening:  Vision Screening: Recommended annual ophthalmology exams for early detection of glaucoma and other disorders of the eye. Would you like a referral to an eye doctor? No    Dental Screening: Recommended annual dental exams for proper oral hygiene  Community Resource Referral / Chronic Care Management: CRR required this visit?  No   CCM required this visit?  No   Plan:    I have personally reviewed and  noted the following in the patient's chart:   Medical and social history Use of alcohol, tobacco or illicit drugs  Current medications and supplements including opioid prescriptions. Patient is not currently taking opioid prescriptions. Functional ability and status Nutritional status Physical activity Advanced directives List of other physicians Hospitalizations, surgeries, and ER visits in previous 12 months Vitals Screenings to include cognitive, depression, and falls Referrals and appointments  In addition, I have reviewed and discussed with patient certain preventive protocols, quality metrics, and best practice recommendations. A written personalized care plan for preventive services as well as general preventive health recommendations were provided to patient.   Geovanny Sartin, CMA   09/14/2023   After Visit Summary: (MyChart) Due to this being a telephonic visit, the after visit summary with  patients personalized plan was offered to patient via MyChart   Notes: Nothing significant to report at this time.

## 2023-09-15 ENCOUNTER — Ambulatory Visit (HOSPITAL_COMMUNITY): Admitting: Psychiatry

## 2023-09-20 ENCOUNTER — Other Ambulatory Visit (HOSPITAL_COMMUNITY)

## 2023-09-21 ENCOUNTER — Encounter (HOSPITAL_COMMUNITY): Payer: Self-pay

## 2023-09-21 ENCOUNTER — Ambulatory Visit (HOSPITAL_COMMUNITY): Attending: Family Medicine

## 2023-09-21 DIAGNOSIS — R269 Unspecified abnormalities of gait and mobility: Secondary | ICD-10-CM | POA: Diagnosis not present

## 2023-09-21 DIAGNOSIS — R29898 Other symptoms and signs involving the musculoskeletal system: Secondary | ICD-10-CM | POA: Insufficient documentation

## 2023-09-21 DIAGNOSIS — Z7409 Other reduced mobility: Secondary | ICD-10-CM | POA: Diagnosis not present

## 2023-09-21 NOTE — Therapy (Signed)
 OUTPATIENT PHYSICAL THERAPY LOWER EXTREMITY TREATMENT   Patient Name: Jennifer Blackburn MRN: [id number] DOB:05/31/1945, 78 y.o., female Today's Date: 09/21/2023  END OF SESSION:  PT End of Session - 09/21/23 1441     Visit Number 2    Number of Visits 12    Date for PT Re-Evaluation 10/19/23    Authorization Type [employer]    Authorization Time Period cohere approved 10 visits from 09/21/23-10/19/23    Authorization - Visit Number 2    Authorization - Number of Visits 10    Progress Note Due on Visit 10    PT Start Time 1441   Late sign in front desk   PT Stop Time 1515    PT Time Calculation (min) 34 min    Equipment Utilized During Treatment Gait belt    Activity Tolerance Patient tolerated treatment well;Patient limited by fatigue    Behavior During Therapy WFL for tasks assessed/performed          Past Medical History:  Diagnosis Date   Anxiety    Arthritis    CAD (coronary artery disease)    Depression    Diabetes mellitus type II    without complication   DJD (degenerative joint disease) of lumbar spine    Hypercholesteremia    Hyperlipidemia    Hypertension    benign    Hypothyroidism    Thyroid  cancer (HCC) 2001   Past Surgical History:  Procedure Laterality Date   ABDOMINAL HYSTERECTOMY     CARDIAC CATHETERIZATION     CATARACT EXTRACTION W/PHACO Right 07/17/2012   Procedure: CATARACT EXTRACTION PHACO AND INTRAOCULAR LENS PLACEMENT (IOC);  Surgeon: Cherene Mania, MD;  Location: [facility];  Service: Ophthalmology;  Laterality: Right;  CDE:25.51   CATARACT EXTRACTION W/PHACO Left 09/17/2016   Procedure: CATARACT EXTRACTION PHACO AND INTRAOCULAR LENS PLACEMENT LEFT EYE;  Surgeon: Mania Cherene, MD;  Location: [facility];  Service: Ophthalmology;  Laterality: Left;  CDE: 19.23   COLONOSCOPY     COLONOSCOPY N/A 01/15/2014   Procedure: COLONOSCOPY;  Surgeon: Lamar CHRISTELLA Hollingshead, MD;  Location: [facility];  Service: Endoscopy;  Laterality: N/A;  9:00 AM   COLONOSCOPY  WITH PROPOFOL  N/A 01/25/2019   Procedure: COLONOSCOPY WITH PROPOFOL ;  Surgeon: Hollingshead Lamar CHRISTELLA, MD; diverticulosis in the entire colon, otherwise normal exam.  No repeat colonoscopy due to age.   DOPPLER ECHOCARDIOGRAPHY     ESOPHAGOGASTRODUODENOSCOPY (EGD) WITH PROPOFOL  N/A 01/25/2019   Procedure: ESOPHAGOGASTRODUODENOSCOPY (EGD) WITH PROPOFOL ;  Surgeon: Hollingshead Lamar CHRISTELLA, MD; normal esophagus without dilation due to inability to pass dilator beyond the hypopharynx, 1 small nonbleeding gastric ulcer s/p biopsied, otherwise normal exam.  Pathology with ulcer with reactive changes, no H. pylori, metaplasia, dysplasia, or malignancy.   ESOPHAGOGASTRODUODENOSCOPY (EGD) WITH PROPOFOL  N/A 08/21/2019   Procedure: ESOPHAGOGASTRODUODENOSCOPY (EGD) WITH PROPOFOL ;  Surgeon: Hollingshead Lamar CHRISTELLA, MD; Normal esophagus. Previously noted gastric ulcer completely healed. Normal examined duodenum.    JOINT REPLACEMENT  07/01/2010   left hip   KNEE SURGERY Right    arthroscopy   left hip replaced  07/01/2010   SPINE SURGERY  2006   cervical   stress dipyridamole myocardial perfusion     THYROIDECTOMY     TONSILLECTOMY     VESICOVAGINAL FISTULA CLOSURE W/ TAH     Patient Active Problem List   Diagnosis Date Noted   Recurrent falls 08/03/2023   Unsteady gait 08/03/2023   Abdominal pain 05/04/2023   Depression, major, single episode, in partial remission (HCC)  03/23/2023   Encounter for annual physical exam 12/30/2022   Stress at home 10/26/2022   Diarrhea of presumed infectious origin 06/06/2022   At risk for abnormal blood glucose level 06/06/2022   Neck pain with history of cervical spinal surgery 12/16/2021   Fall 12/01/2021   Left knee pain 01/25/2021   Constipation 06/06/2020   Leg swelling 11/18/2019   Pruritus 11/18/2019   Muscle pain 02/07/2019   Dysphagia 11/11/2018   Overweight (BMI 25.0-29.9) 11/05/2018   Cervical spondylosis with radiculopathy 06/20/2017   Hyperlipidemia    CAD (coronary artery  disease)    Anxiety    Osteoarthritis 02/17/2017   Family history of coronary artery disease in father 02/17/2017   Lumbar spondylosis with myelopathy 07/18/2016   Laryngopharyngeal reflux (LPR) 06/24/2016   Type 2 diabetes mellitus with other specified complication (HCC) 01/13/2015   GERD (gastroesophageal reflux disease) 04/11/2014   At high risk for falls 12/31/2013   Postsurgical hypothyroidism 01/05/2013   Back pain with radiculopathy 11/28/2012   Vitamin D  deficiency 10/14/2010   Bilateral carotid bruits 10/13/2010   Allergic rhinitis 11/23/2007   Primary hypertension 04/14/2007   Coronary atherosclerosis 04/14/2007   Headache 04/14/2007   Thyroid  cancer (HCC) 03/23/1999    PCP: [name], MD  REFERRING PROVIDER: [name], MD  REFERRING DIAG: W19.CHERENE (ICD-10-CM) - Fall, initial encounter R29.6 (ICD-10-CM) - Recurrent falls R26.81 (ICD-10-CM) - Unsteady gait  THERAPY DIAG:  Impaired functional mobility, balance, gait, and endurance  Abnormal gait  Decreased strength of lower extremity  Rationale for Evaluation and Treatment: Rehabilitation  ONSET DATE: Fall occurred about 3 weeks ago, middle of May  SUBJECTIVE:   SUBJECTIVE STATEMENT: 09/21/23:  No reports of pain or reports of fall since last session.  Has began HEP daily.  No reports of dizziness today, stated it was bad yesterday.    Eval:  Pt states she was out looking for flowers at the store and was reaching for flowers and fell into [place]. Pt states no one was around and she fell again after trying to get up. Eventually got up on her own and went straight home. Pt states she has had about 5-6 falls, couple at home, one in [place] , [place]. Pt states she feels a little nervous about falling. Pt states she has been staying in more often due to fear of falling, low back pain and hip pain.  PERTINENT HISTORY: L hip replacement, about 10 years ago Diabetes,  medicated PAIN:  Are you having pain? No, occasional back pain, stooping over is problematic   PRECAUTIONS: Fall  RED FLAGS: Occasional stomach pain after taking certain medication   WEIGHT BEARING RESTRICTIONS: No  FALLS:  Has patient fallen in last 6 months? Yes. Number of falls 2  LIVING ENVIRONMENT: Lives with: lives with their spouse Lives in: House/apartment Stairs: Yes: Internal: 15 steps; on right going up and External: 2, 5 steps; on right going up Has following equipment at home: Single point cane and has not been using it recently  OCCUPATION: retired  PLOF: Independent  PATIENT GOALS: decreased fear of falling, increased activity tolerance, increased LE strength  NEXT MD VISIT: none reported  OBJECTIVE:  Note: Objective measures were completed at Evaluation unless otherwise noted.  DIAGNOSTIC FINDINGS: N/A  PATIENT SURVEYS:  ABC scale 1280/1600  COGNITION: Overall cognitive status: Within functional limits for tasks assessed     SENSATION: WFL    LOWER EXTREMITY ROM:  Active ROM Right eval  Left eval  Hip flexion    Hip extension    Hip abduction    Hip adduction    Hip internal rotation    Hip external rotation    Knee flexion    Knee extension    Ankle dorsiflexion    Ankle plantarflexion    Ankle inversion    Ankle eversion     (Blank rows = not tested)  LOWER EXTREMITY MMT:  MMT Right eval Left eval  Hip flexion 3+ 3+  Hip extension 3 3  Hip abduction 3+ 3+  Hip adduction 3+ 3+  Hip internal rotation    Hip external rotation    Knee flexion 4 4  Knee extension 4 4  Ankle dorsiflexion 3+ 3+  Ankle plantarflexion    Ankle inversion    Ankle eversion     (Blank rows = not tested)   FUNCTIONAL TESTS:  5 times sit to stand: 16.79 seconds Timed up and go (TUG): 11.11 seconds SLS 09/07/23: R: 15.69s L: 8.98s  09/21/23: 358ft with reports of increased LBP to 5/10 and radicular symptoms down posterior Lt LE to calf     GAIT: Distance walked: 75 feet to and from treatment area Assistive device utilized: None Level of assistance: Complete Independence Comments: Pt demonstrates decreased gait speed and stride length bilaterally.                                                                                                                                 TREATMENT DATE:  09/21/23:   Reviewed goals Educated importance of HEP 361ft no AD   FGA: 1. GAIT LEVEL SURFACE Instructions: Walk at your normal speed from here to the next mark (6 m [20 ft]). Grading: Oneil the highest category that applies.-2 points 2. CHANGE IN GAIT SPEED Instructions: Begin walking at your normal pace (for 5 ft [1.5 m]). When I tell you "go," walk as fast as you can (for 1.5 m [5 ft]). When I tell you "slow," walk as slowly as you can (for 5 ft [1.5 m]). Grading: Oneil the highest category that applies. -1 point 3. GAIT WITH HORIZONTAL HEAD TURNS Instructions: Walk from here to the next mark 20 ft (6 m) away. Begin walking at your normal pace. Keep walking straight; after 3 steps, turn your head to the right and keep walking straight while looking to the right. After 3 more steps, turn your head to the left and keep walking straight while looking left. Continue alternating looking right and left every 3 steps until you have completed 2 repetitions in each direction. Grading: Oneil the highest category that applies. -1 point 4. GAIT WITH VERTICAL HEAD TURNS Instructions: Walk from here to the next mark (20 ft [6 m]). Begin walking at your normal pace. Keep walking straight; after 3 steps, tip your head up and keep walking straight while looking up. After 3 more steps, tip your head down, keep  walking straight while looking down. Continue alternating looking up and down every 3 steps until you have completed 2 repetitions in each direction. Grading: Oneil the highest category that applies. -1 point 5. GAIT AND PIVOT  TURN Instructions: Begin with walking at your normal pace. When I tell you, "turn and stop," turn as quickly as you can to face the opposite direction and stop. Grading: Oneil the highest category that applies. - 2 points 6. STEP OVER OBSTACLE Instructions: Begin walking at your normal speed. When you come to the shoe box, step over it, not around it, and keep walking. Grading: Oneil the highest category that applies. -1 point 7. GAIT WITH NARROW BASE OF SUPPORT Instructions: Walk on the floor with arms folded across the chest, feet aligned heel to toe in tandem for a distance of 3.6 m [12 ft]. The number of steps taken in a straight line are counted for a maximum of 10 steps. Grading: Oneil the highest category that applies. -1 point 8. GAIT WITH EYES CLOSED Instructions: Walk at your normal speed from here to the next mark (20 ft [6 m]) with your eyes closed. Grading: Oneil the highest category that applies. - 1 point 9. AMBULATING BACKWARDS Instructions: Walk backwards until I tell you to stop. Grading: Oneil the highest category that applies. -2 point 10. STEPS Instructions: Walk up these stairs as you would at home (ie, using the rail if necessary). At the top turn around and walk down. Grading: Oneil the highest category that applies. -1 point  Functional Gait Assessment: 13 / 30 = 43.3 %  10 STS standard chair height no HHA SLS Rt 25, Lt 26 Bridge 10x  09/07/2023   Evaluation: -ROM measured, Strength assessed, HEP prescribed, pt educated on prognosis, findings, and importance of HEP compliance if given.        PATIENT EDUCATION:  Education details: Pt was educated on findings of PT evaluation, prognosis, frequency of therapy visits and rationale, attendance policy, and HEP if given.   Person educated: Patient Education method: Explanation, Verbal cues, and Handouts Education comprehension: verbalized understanding, verbal cues required, and needs further education  HOME  EXERCISE PROGRAM: Access Code: [email or web address] URL: [email or web address] Date: 09/07/2023 Prepared by: Lang Ada  Exercises - Supine Bridge  - 1 x daily - 7 x weekly - 3 sets - 10 reps - Sit to Stand Without Arm Support  - 1 x daily - 7 x weekly - 3 sets - 10 reps - Standing Single Leg Stance with Counter Support  - 1 x daily - 7 x weekly - 1 sets - 3 reps - 30 seconds hold  ASSESSMENT:  CLINICAL IMPRESSION: 09/21/23:  Reviewed goals and educated importance of HEP compliance for maximal benefits.  Pt reports compliance and able to recall current exercise program.  This session with more objective testing to assess and a functional gait assessment.  Pt presents with decreased cadence when looking all directions and required a complete stop prior to obstacles for safety, scored a 13/30 on FGA.  Pt limited by fatigue with activity and reports LBP with radicular symptoms down Lt posterior leg following that was resolved following sitting for a few minutes.  Pt will benefit from skilled intervention to assist with proximal strengthening and balance to reduce risk of fall.  No additional exercises added to HEP this session.    Eval:  Patient is a 78 y.o. female who was seen today for physical therapy evaluation and treatment for W19.XXXA (ICD-10-CM) -  Fall, initial encounter R29.6 (ICD-10-CM) - Recurrent falls R26.81 (ICD-10-CM) - Unsteady gait.   Patient demonstrates decreased BLE strength, abnormal gait, and impaired balance. Patient also demonstrates difficulty with ambulation during today's session with decreased stride length and velocity noted. Patient also demonstrates decreased SLS ability bilaterally left worse than right. Patient requires education on role of PT, HEP and importance of HEP compliance. Patient would benefit from skilled physical therapy for increased endurance with ambulation, increased LE strength, and balance for improved gait quality, return to higher level  of function with ADLs, and progress towards therapy goals.   OBJECTIVE IMPAIRMENTS: Abnormal gait, decreased balance, decreased endurance, decreased strength, and pain.   ACTIVITY LIMITATIONS: carrying, lifting, standing, stairs, transfers, and locomotion level  PARTICIPATION LIMITATIONS: shopping, community activity, and yard work  PERSONAL FACTORS: Age, Past/current experiences, Time since onset of injury/illness/exacerbation, and 1 comorbidity: history of falls are also affecting patient's functional outcome.   REHAB POTENTIAL: Fair history of falling  CLINICAL DECISION MAKING: Stable/uncomplicated  EVALUATION COMPLEXITY: Low   GOALS: Goals reviewed with patient? No  SHORT TERM GOALS: Target date: 09/28/23  Pt will be independent with HEP in order to demonstrate participation in Physical Therapy POC.  Baseline: Goal status: INITIAL  2.  Pt will improve ABC score by 15% in order to demonstrate improved pain with functional goals and outcomes. Baseline: see objective Goal status: INITIAL  LONG TERM GOALS: Target date: 10/19/23  Pt will increase SLS time to at least 20 seconds on each LE in order to demonstrate improved balance with ADL/mobility.   Baseline: see objective.  Goal status: INITIAL  2.  Pt will improve 2 MWT by 40 in order to demonstrate improved functional mobility capacity in community setting.  Baseline: see objective.  Goal status: INITIAL  3.  Pt will improve ABC score by 30% from original score in order to demonstrate improved pain with functional goals and outcomes. Baseline: see objective.  Goal status: INITIAL  4.  Pt will improve 5TSTS time by at least 2.3 seconds in order to demonstrate improved functional strength during functional transfers.  Baseline: see objective.  Goal status: INITIAL    PLAN:  PT FREQUENCY: 1-2x/week  PT DURATION: 6 weeks  PLANNED INTERVENTIONS: 97110-Therapeutic exercises, 97530- Therapeutic activity, 97112-  Neuromuscular re-education, 97535- Self Care, [id number]- Manual therapy, [phone]- Gait training, Patient/Family education, Balance training, Stair training, and DME instructions  PLAN FOR NEXT SESSION: Progress balance and LE strengthening  Augustin Mclean, LPTA/CLT; CBIS [phone]  4:34 PM, 09/21/23

## 2023-09-27 ENCOUNTER — Ambulatory Visit: Payer: Self-pay | Admitting: Family Medicine

## 2023-09-27 ENCOUNTER — Ambulatory Visit (HOSPITAL_COMMUNITY)
Admission: RE | Admit: 2023-09-27 | Discharge: 2023-09-27 | Disposition: A | Discharge status: Home / Self Care | Source: Ambulatory Visit | Attending: Family Medicine | Admitting: Family Medicine

## 2023-09-27 DIAGNOSIS — Z780 Asymptomatic menopausal state: Secondary | ICD-10-CM | POA: Diagnosis not present

## 2023-09-29 ENCOUNTER — Encounter (HOSPITAL_COMMUNITY): Admitting: Physical Therapy

## 2023-09-29 ENCOUNTER — Ambulatory Visit (INDEPENDENT_AMBULATORY_CARE_PROVIDER_SITE_OTHER): Admitting: Psychiatry

## 2023-09-29 DIAGNOSIS — Z630 Problems in relationship with spouse or partner: Secondary | ICD-10-CM | POA: Diagnosis not present

## 2023-09-29 DIAGNOSIS — Z733 Stress, not elsewhere classified: Secondary | ICD-10-CM | POA: Diagnosis not present

## 2023-09-29 NOTE — Progress Notes (Signed)
 IN-PERSON  THERAPIST PROGRESS NOTE  Session Time:  Thursday 09/29/2023 2:10 PM -  2:52 PM      Participation Level: Active  Behavioral Response: CasualAlertAnxious  Type of Therapy: Individual Therapy  Treatment Goals addressed: Verbalize an understanding and resolution of current interpersonal problems Learn and implement conflict resolution skills to resolve interpersonal problems   ProgressTowards Goals: Progressing  Interventions: Supportive/CBT  Summary: Jennifer Blackburn is a 78 y.o. female who is referred for services by PCP Dr. Rollene Pesa due to pt experiencing stress. She denies any psychiatric hospitalizations. She is a returning pt to this clinician and last was seen in 2016.  Patient is resuming services as she reports increased stress triggered by health issues, increased fatigue, and marital issues.  Patient's current symptoms include fatigue, worry, and muscle tension.  Patient last was seen about 5-6 weeks  ago.  She reports continued stress regarding marital issues.  She expresses continued frustration as husband remains very critical, continues to make negative comments, and dismisses her feelings per her report.  She reports feeling less comfortable going places due to husband's past behavioral patterns with other women.  She continues to express her sadness regarding her husband.  . She continues to have support from her children and grandchildren.    Suicidal/Homicidal: Nowithout intent/plan  Therapist Response: Reviewed symptoms, discussed stressors, facilitated expression of thoughts and feelings, validated feelings, assisted patient identify realistic expectations regarding husband, encouraged patient to increase involvement and positive relationships and sources of energy, again encouraged patient to focus on self nurture, assisted patient identified ways to improve assertiveness skills and to sit and maintain limits regarding relationship with husband  Plan:  Return again in 2 weeks.  Diagnosis: Stress due to marital problems  Collaboration of Care: Primary Care Provider AEB patient sees PCP Dr. Rollene Pesa  Patient/Guardian was advised Release of Information must be obtained prior to any record release in order to collaborate their care with an outside provider. Patient/Guardian was advised if they have not already done so to contact the registration department to sign all necessary forms in order for us  to release information regarding their care.   Consent: Patient/Guardian gives verbal consent for treatment and assignment of benefits for services provided during this visit. Patient/Guardian expressed understanding and agreed to proceed.   Winton FORBES Rubinstein, LCSW 09/29/2023

## 2023-10-04 ENCOUNTER — Encounter (HOSPITAL_COMMUNITY): Payer: Self-pay

## 2023-10-04 ENCOUNTER — Ambulatory Visit (HOSPITAL_COMMUNITY)

## 2023-10-04 DIAGNOSIS — R29898 Other symptoms and signs involving the musculoskeletal system: Secondary | ICD-10-CM

## 2023-10-04 DIAGNOSIS — R269 Unspecified abnormalities of gait and mobility: Secondary | ICD-10-CM

## 2023-10-04 DIAGNOSIS — Z7409 Other reduced mobility: Secondary | ICD-10-CM | POA: Diagnosis not present

## 2023-10-04 NOTE — Therapy (Signed)
 OUTPATIENT PHYSICAL THERAPY LOWER EXTREMITY TREATMENT   Patient Name: Jennifer Blackburn MRN: [id number] DOB:Sep 19, 1945, 78 y.o., female Today's Date: 10/04/2023  END OF SESSION:  PT End of Session - 10/04/23 1523     Visit Number 3    Number of Visits 12    Date for PT Re-Evaluation 10/19/23    Authorization Type [employer]    Authorization Time Period cohere approved 10 visits from 09/21/23-10/19/23    Authorization - Visit Number 3    Authorization - Number of Visits 10    Progress Note Due on Visit 10    PT Start Time 1524          Past Medical History:  Diagnosis Date   Anxiety    Arthritis    CAD (coronary artery disease)    Depression    Diabetes mellitus type II    without complication   DJD (degenerative joint disease) of lumbar spine    Hypercholesteremia    Hyperlipidemia    Hypertension    benign    Hypothyroidism    Thyroid  cancer (HCC) 2001   Past Surgical History:  Procedure Laterality Date   ABDOMINAL HYSTERECTOMY     CARDIAC CATHETERIZATION     CATARACT EXTRACTION W/PHACO Right 07/17/2012   Procedure: CATARACT EXTRACTION PHACO AND INTRAOCULAR LENS PLACEMENT (IOC);  Surgeon: Cherene Mania, MD;  Location: [facility];  Service: Ophthalmology;  Laterality: Right;  CDE:25.51   CATARACT EXTRACTION W/PHACO Left 09/17/2016   Procedure: CATARACT EXTRACTION PHACO AND INTRAOCULAR LENS PLACEMENT LEFT EYE;  Surgeon: Mania Cherene, MD;  Location: [facility];  Service: Ophthalmology;  Laterality: Left;  CDE: 19.23   COLONOSCOPY     COLONOSCOPY N/A 01/15/2014   Procedure: COLONOSCOPY;  Surgeon: Lamar CHRISTELLA Hollingshead, MD;  Location: [facility];  Service: Endoscopy;  Laterality: N/A;  9:00 AM   COLONOSCOPY WITH PROPOFOL  N/A 01/25/2019   Procedure: COLONOSCOPY WITH PROPOFOL ;  Surgeon: Hollingshead Lamar CHRISTELLA, MD; diverticulosis in the entire colon, otherwise normal exam.  No repeat colonoscopy due to age.   DOPPLER ECHOCARDIOGRAPHY     ESOPHAGOGASTRODUODENOSCOPY (EGD) WITH PROPOFOL  N/A  01/25/2019   Procedure: ESOPHAGOGASTRODUODENOSCOPY (EGD) WITH PROPOFOL ;  Surgeon: Hollingshead Lamar CHRISTELLA, MD; normal esophagus without dilation due to inability to pass dilator beyond the hypopharynx, 1 small nonbleeding gastric ulcer s/p biopsied, otherwise normal exam.  Pathology with ulcer with reactive changes, no H. pylori, metaplasia, dysplasia, or malignancy.   ESOPHAGOGASTRODUODENOSCOPY (EGD) WITH PROPOFOL  N/A 08/21/2019   Procedure: ESOPHAGOGASTRODUODENOSCOPY (EGD) WITH PROPOFOL ;  Surgeon: Hollingshead Lamar CHRISTELLA, MD; Normal esophagus. Previously noted gastric ulcer completely healed. Normal examined duodenum.    JOINT REPLACEMENT  07/01/2010   left hip   KNEE SURGERY Right    arthroscopy   left hip replaced  07/01/2010   SPINE SURGERY  2006   cervical   stress dipyridamole myocardial perfusion     THYROIDECTOMY     TONSILLECTOMY     VESICOVAGINAL FISTULA CLOSURE W/ TAH     Patient Active Problem List   Diagnosis Date Noted   Recurrent falls 08/03/2023   Unsteady gait 08/03/2023   Abdominal pain 05/04/2023   Depression, major, single episode, in partial remission (HCC) 03/23/2023   Encounter for annual physical exam 12/30/2022   Stress at home 10/26/2022   Diarrhea of presumed infectious origin 06/06/2022   At risk for abnormal blood glucose level 06/06/2022   Neck pain with history of cervical spinal surgery 12/16/2021   Fall 12/01/2021   Left knee pain  01/25/2021   Constipation 06/06/2020   Leg swelling 11/18/2019   Pruritus 11/18/2019   Muscle pain 02/07/2019   Dysphagia 11/11/2018   Overweight (BMI 25.0-29.9) 11/05/2018   Cervical spondylosis with radiculopathy 06/20/2017   Hyperlipidemia    CAD (coronary artery disease)    Anxiety    Osteoarthritis 02/17/2017   Family history of coronary artery disease in father 02/17/2017   Lumbar spondylosis with myelopathy 07/18/2016   Laryngopharyngeal reflux (LPR) 06/24/2016   Type 2 diabetes mellitus with other specified complication (HCC)  01/13/2015   GERD (gastroesophageal reflux disease) 04/11/2014   At high risk for falls 12/31/2013   Postsurgical hypothyroidism 01/05/2013   Back pain with radiculopathy 11/28/2012   Vitamin D  deficiency 10/14/2010   Bilateral carotid bruits 10/13/2010   Allergic rhinitis 11/23/2007   Primary hypertension 04/14/2007   Coronary atherosclerosis 04/14/2007   Headache 04/14/2007   Thyroid  cancer (HCC) 03/23/1999    PCP: [name], MD  REFERRING PROVIDER: [name], MD  REFERRING DIAG: W19.CHERENE (ICD-10-CM) - Fall, initial encounter R29.6 (ICD-10-CM) - Recurrent falls R26.81 (ICD-10-CM) - Unsteady gait  THERAPY DIAG:  Impaired functional mobility, balance, gait, and endurance  Abnormal gait  Decreased strength of lower extremity  Rationale for Evaluation and Treatment: Rehabilitation  ONSET DATE: Fall occurred about 3 weeks ago, middle of May  SUBJECTIVE:   SUBJECTIVE STATEMENT: 10/04/23:  No reports of pain or recent falls. Has began HEP daily.  No reports of dizziness today, had some on Saturday and Sunday.    Eval:  Pt states she was out looking for flowers at the store and was reaching for flowers and fell into [place]. Pt states no one was around and she fell again after trying to get up. Eventually got up on her own and went straight home. Pt states she has had about 5-6 falls, couple at home, one in [place] , one [place]. Pt states she feels a little nervous about falling. Pt states she has been staying in more often due to fear of falling, low back pain and hip pain.  PERTINENT HISTORY: L hip replacement, about 10 years ago Diabetes, medicated PAIN:  Are you having pain? No, occasional back pain, stooping over is problematic   PRECAUTIONS: Fall  RED FLAGS: Occasional stomach pain after taking certain medication   WEIGHT BEARING RESTRICTIONS: No  FALLS:  Has patient fallen in last 6 months? Yes. Number of falls 2  LIVING  ENVIRONMENT: Lives with: lives with their spouse Lives in: House/apartment Stairs: Yes: Internal: 15 steps; on right going up and External: 2, 5 steps; on right going up Has following equipment at home: Single point cane and has not been using it recently  OCCUPATION: retired  PLOF: Independent  PATIENT GOALS: decreased fear of falling, increased activity tolerance, increased LE strength  NEXT MD VISIT: none reported  OBJECTIVE:  Note: Objective measures were completed at Evaluation unless otherwise noted.  DIAGNOSTIC FINDINGS: N/A  PATIENT SURVEYS:  ABC scale 1280/1600  COGNITION: Overall cognitive status: Within functional limits for tasks assessed     SENSATION: WFL    LOWER EXTREMITY ROM:  Active ROM Right eval Left eval  Hip flexion    Hip extension    Hip abduction    Hip adduction    Hip internal rotation    Hip external rotation    Knee flexion    Knee extension    Ankle dorsiflexion    Ankle plantarflexion    Ankle inversion  Ankle eversion     (Blank rows = not tested)  LOWER EXTREMITY MMT:  MMT Right eval Left eval  Hip flexion 3+ 3+  Hip extension 3 3  Hip abduction 3+ 3+  Hip adduction 3+ 3+  Hip internal rotation    Hip external rotation    Knee flexion 4 4  Knee extension 4 4  Ankle dorsiflexion 3+ 3+  Ankle plantarflexion    Ankle inversion    Ankle eversion     (Blank rows = not tested)   FUNCTIONAL TESTS:  5 times sit to stand: 16.79 seconds Timed up and go (TUG): 11.11 seconds SLS 09/07/23: R: 15.69s L: 8.98s  09/21/23: 354ft with reports of increased LBP to 5/10 and radicular symptoms down posterior Lt LE to calf    GAIT: Distance walked: 75 feet to and from treatment area Assistive device utilized: None Level of assistance: Complete Independence Comments: Pt demonstrates decreased gait speed and stride length bilaterally.                                                                                                                                  TREATMENT DATE:  10/04/23: Heel raises incline slope Toe raises decline slope Marching alternating 10x 3 no HHA  Partial tandem stance 2x 30 Sidestep with RTB 3RT Squat front of chair 10x SLS Rt 20, Lt 20 max Vector stance 3 x 5  09/21/23:  Reviewed goals Educated importance of HEP 363ft no AD   FGA: 1. GAIT LEVEL SURFACE Instructions: Walk at your normal speed from here to the next mark (6 m [20 ft]). Grading: Oneil the highest category that applies.-2 points 2. CHANGE IN GAIT SPEED Instructions: Begin walking at your normal pace (for 5 ft [1.5 m]). When I tell you "go," walk as fast as you can (for 1.5 m [5 ft]). When I tell you "slow," walk as slowly as you can (for 5 ft [1.5 m]). Grading: Oneil the highest category that applies. -1 point 3. GAIT WITH HORIZONTAL HEAD TURNS Instructions: Walk from here to the next mark 20 ft (6 m) away. Begin walking at your normal pace. Keep walking straight; after 3 steps, turn your head to the right and keep walking straight while looking to the right. After 3 more steps, turn your head to the left and keep walking straight while looking left. Continue alternating looking right and left every 3 steps until you have completed 2 repetitions in each direction. Grading: Oneil the highest category that applies. -1 point 4. GAIT WITH VERTICAL HEAD TURNS Instructions: Walk from here to the next mark (20 ft [6 m]). Begin walking at your normal pace. Keep walking straight; after 3 steps, tip your head up and keep walking straight while looking up. After 3 more steps, tip your head down, keep walking straight while looking down. Continue alternating looking up and down every 3 steps until you have completed 2  repetitions in each direction. Grading: Oneil the highest category that applies. -1 point 5. GAIT AND PIVOT TURN Instructions: Begin with walking at your normal pace. When I tell you, "turn and stop," turn as quickly  as you can to face the opposite direction and stop. Grading: Oneil the highest category that applies. - 2 points 6. STEP OVER OBSTACLE Instructions: Begin walking at your normal speed. When you come to the shoe box, step over it, not around it, and keep walking. Grading: Oneil the highest category that applies. -1 point 7. GAIT WITH NARROW BASE OF SUPPORT Instructions: Walk on the floor with arms folded across the chest, feet aligned heel to toe in tandem for a distance of 3.6 m [12 ft]. The number of steps taken in a straight line are counted for a maximum of 10 steps. Grading: Oneil the highest category that applies. -1 point 8. GAIT WITH EYES CLOSED Instructions: Walk at your normal speed from here to the next mark (20 ft [6 m]) with your eyes closed. Grading: Oneil the highest category that applies. - 1 point 9. AMBULATING BACKWARDS Instructions: Walk backwards until I tell you to stop. Grading: Oneil the highest category that applies. -2 point 10. STEPS Instructions: Walk up these stairs as you would at home (ie, using the rail if necessary). At the top turn around and walk down. Grading: Oneil the highest category that applies. -1 point  Functional Gait Assessment: 13 / 30 = 43.3 %  10 STS standard chair height no HHA SLS Rt 25, Lt 26 Bridge 10x  09/07/2023   Evaluation: -ROM measured, Strength assessed, HEP prescribed, pt educated on prognosis, findings, and importance of HEP compliance if given.        PATIENT EDUCATION:  Education details: Pt was educated on findings of PT evaluation, prognosis, frequency of therapy visits and rationale, attendance policy, and HEP if given.   Person educated: Patient Education method: Explanation, Verbal cues, and Handouts Education comprehension: verbalized understanding, verbal cues required, and needs further education  HOME EXERCISE PROGRAM: Access Code: [email or web address] URL: [email or web address] Date: 09/07/2023 Prepared  by: Lang Ada  Exercises - Supine Bridge  - 1 x daily - 7 x weekly - 3 sets - 10 reps - Sit to Stand Without Arm Support  - 1 x daily - 7 x weekly - 3 sets - 10 reps - Standing Single Leg Stance with Counter Support  - 1 x daily - 7 x weekly - 1 sets - 3 reps - 30 seconds hold  10/04/23: - Side Stepping with Resistance at Thighs and Counter Support  - 2 x daily - 7 x weekly - 1 sets - 10 reps  ASSESSMENT:  CLINICAL IMPRESSION: 10/04/23:  Session focus with balance and LE strengthening with new standing exercises.  Pt presents with good carryover following initial demonstration and min cueing to improve weight distribution to assist with balance activities.  Pt c/o LBP during tandem stance with Lt foot posterior, resolved following exercise.  Added sidestep with RTB to HEP, encouraged to complete in safe location of home with HHA available.  No reports of pain at end of session, was limited by appropriate fatigue levels.    Eval:  Patient is a 78 y.o. female who was seen today for physical therapy evaluation and treatment for W19.CHERENE (ICD-10-CM) - Fall, initial encounter R29.6 (ICD-10-CM) - Recurrent falls R26.81 (ICD-10-CM) - Unsteady gait.   Patient demonstrates decreased BLE strength, abnormal gait, and impaired balance. Patient also demonstrates difficulty with  ambulation during today's session with decreased stride length and velocity noted. Patient also demonstrates decreased SLS ability bilaterally left worse than right. Patient requires education on role of PT, HEP and importance of HEP compliance. Patient would benefit from skilled physical therapy for increased endurance with ambulation, increased LE strength, and balance for improved gait quality, return to higher level of function with ADLs, and progress towards therapy goals.   OBJECTIVE IMPAIRMENTS: Abnormal gait, decreased balance, decreased endurance, decreased strength, and pain.   ACTIVITY LIMITATIONS: carrying, lifting,  standing, stairs, transfers, and locomotion level  PARTICIPATION LIMITATIONS: shopping, community activity, and yard work  PERSONAL FACTORS: Age, Past/current experiences, Time since onset of injury/illness/exacerbation, and 1 comorbidity: history of falls are also affecting patient's functional outcome.   REHAB POTENTIAL: Fair history of falling  CLINICAL DECISION MAKING: Stable/uncomplicated  EVALUATION COMPLEXITY: Low   GOALS: Goals reviewed with patient? No  SHORT TERM GOALS: Target date: 09/28/23  Pt will be independent with HEP in order to demonstrate participation in Physical Therapy POC.  Baseline: Goal status: INITIAL  2.  Pt will improve ABC score by 15% in order to demonstrate improved pain with functional goals and outcomes. Baseline: see objective Goal status: INITIAL  LONG TERM GOALS: Target date: 10/19/23  Pt will increase SLS time to at least 20 seconds on each LE in order to demonstrate improved balance with ADL/mobility.   Baseline: see objective.  Goal status: INITIAL  2.  Pt will improve 2 MWT by 40 in order to demonstrate improved functional mobility capacity in community setting.  Baseline: see objective.  Goal status: INITIAL  3.  Pt will improve ABC score by 30% from original score in order to demonstrate improved pain with functional goals and outcomes. Baseline: see objective.  Goal status: INITIAL  4.  Pt will improve 5TSTS time by at least 2.3 seconds in order to demonstrate improved functional strength during functional transfers.  Baseline: see objective.  Goal status: INITIAL    PLAN:  PT FREQUENCY: 1-2x/week  PT DURATION: 6 weeks  PLANNED INTERVENTIONS: 97110-Therapeutic exercises, 97530- Therapeutic activity, 97112- Neuromuscular re-education, 97535- Self Care, [id number]- Manual therapy, [id number]- Gait training, Patient/Family education, Balance training, Stair training, and DME instructions  PLAN FOR NEXT SESSION: Progress balance and  LE strengthening  Augustin Mclean, LPTA/CLT; CBIS [phone]  3:24 PM, 10/04/23

## 2023-10-06 ENCOUNTER — Ambulatory Visit (HOSPITAL_COMMUNITY): Admitting: Physical Therapy

## 2023-10-06 DIAGNOSIS — R269 Unspecified abnormalities of gait and mobility: Secondary | ICD-10-CM | POA: Diagnosis not present

## 2023-10-06 DIAGNOSIS — E1165 Type 2 diabetes mellitus with hyperglycemia: Secondary | ICD-10-CM | POA: Diagnosis not present

## 2023-10-06 DIAGNOSIS — Z7409 Other reduced mobility: Secondary | ICD-10-CM

## 2023-10-06 DIAGNOSIS — R29898 Other symptoms and signs involving the musculoskeletal system: Secondary | ICD-10-CM

## 2023-10-06 NOTE — Therapy (Signed)
 OUTPATIENT PHYSICAL THERAPY LOWER EXTREMITY TREATMENT   Patient Name: Jennifer Blackburn MRN: [id number] DOB:[birth year], 78 y.o., female Today's Date: 10/06/2023  END OF SESSION:  PT End of Session - 10/06/23 1310     Visit Number 4    Number of Visits 12    Date for PT Re-Evaluation 10/19/23    Authorization Type [employer]    Authorization Time Period cohere approved 10 visits from 09/21/23-10/19/23    Authorization - Visit Number 4    Authorization - Number of Visits 10    Progress Note Due on Visit 10    PT Start Time 1306    PT Stop Time 1347    PT Time Calculation (min) 41 min    Equipment Utilized During Treatment Gait belt    Activity Tolerance Patient tolerated treatment well;Patient limited by fatigue    Behavior During Therapy WFL for tasks assessed/performed          Past Medical History:  Diagnosis Date   Anxiety    Arthritis    CAD (coronary artery disease)    Depression    Diabetes mellitus type II    without complication   DJD (degenerative joint disease) of lumbar spine    Hypercholesteremia    Hyperlipidemia    Hypertension    benign    Hypothyroidism    Thyroid  cancer (HCC) 2001   Past Surgical History:  Procedure Laterality Date   ABDOMINAL HYSTERECTOMY     CARDIAC CATHETERIZATION     CATARACT EXTRACTION W/PHACO Right 07/17/2012   Procedure: CATARACT EXTRACTION PHACO AND INTRAOCULAR LENS PLACEMENT (IOC);  Surgeon: Jennifer Mania, MD;  Location: [facility];  Service: Ophthalmology;  Laterality: Right;  CDE:25.51   CATARACT EXTRACTION W/PHACO Left 09/17/2016   Procedure: CATARACT EXTRACTION PHACO AND INTRAOCULAR LENS PLACEMENT LEFT EYE;  Surgeon: Blackburn Cherene, MD;  Location: [facility];  Service: Ophthalmology;  Laterality: Left;  CDE: 19.23   COLONOSCOPY     COLONOSCOPY N/A 01/15/2014   Procedure: COLONOSCOPY;  Surgeon: Jennifer Blackburn Hollingshead, MD;  Location: [facility];  Service: Endoscopy;  Laterality: N/A;  9:00 AM   COLONOSCOPY WITH PROPOFOL  N/A  01/25/2019   Procedure: COLONOSCOPY WITH PROPOFOL ;  Surgeon: Blackburn Jennifer CHRISTELLA, MD; diverticulosis in the entire colon, otherwise normal exam.  No repeat colonoscopy due to age.   DOPPLER ECHOCARDIOGRAPHY     ESOPHAGOGASTRODUODENOSCOPY (EGD) WITH PROPOFOL  N/A 01/25/2019   Procedure: ESOPHAGOGASTRODUODENOSCOPY (EGD) WITH PROPOFOL ;  Surgeon: Blackburn Jennifer CHRISTELLA, MD; normal esophagus without dilation due to inability to pass dilator beyond the hypopharynx, 1 small nonbleeding gastric ulcer s/p biopsied, otherwise normal exam.  Pathology with ulcer with reactive changes, no H. pylori, metaplasia, dysplasia, or malignancy.   ESOPHAGOGASTRODUODENOSCOPY (EGD) WITH PROPOFOL  N/A 08/21/2019   Procedure: ESOPHAGOGASTRODUODENOSCOPY (EGD) WITH PROPOFOL ;  Surgeon: Blackburn Jennifer CHRISTELLA, MD; Normal esophagus. Previously noted gastric ulcer completely healed. Normal examined duodenum.    JOINT REPLACEMENT  07/01/2010   left hip   KNEE SURGERY Right    arthroscopy   left hip replaced  07/01/2010   SPINE SURGERY  2006   cervical   stress dipyridamole myocardial perfusion     THYROIDECTOMY     TONSILLECTOMY     VESICOVAGINAL FISTULA CLOSURE W/ TAH     Patient Active Problem List   Diagnosis Date Noted   Recurrent falls 08/03/2023   Unsteady gait 08/03/2023   Abdominal pain 05/04/2023   Depression, major, single episode, in partial remission (HCC) 03/23/2023   Encounter for annual  physical exam 12/30/2022   Stress at home 10/26/2022   Diarrhea of presumed infectious origin 06/06/2022   At risk for abnormal blood glucose level 06/06/2022   Neck pain with history of cervical spinal surgery 12/16/2021   Fall 12/01/2021   Left knee pain 01/25/2021   Constipation 06/06/2020   Leg swelling 11/18/2019   Pruritus 11/18/2019   Muscle pain 02/07/2019   Dysphagia 11/11/2018   Overweight (BMI 25.0-29.9) 11/05/2018   Cervical spondylosis with radiculopathy 06/20/2017   Hyperlipidemia    CAD (coronary artery disease)     Anxiety    Osteoarthritis 02/17/2017   Family history of coronary artery disease in father 02/17/2017   Lumbar spondylosis with myelopathy 07/18/2016   Laryngopharyngeal reflux (LPR) 06/24/2016   Type 2 diabetes mellitus with other specified complication (HCC) 01/13/2015   GERD (gastroesophageal reflux disease) 04/11/2014   At high risk for falls 12/31/2013   Postsurgical hypothyroidism 01/05/2013   Back pain with radiculopathy 11/28/2012   Vitamin D  deficiency 10/14/2010   Bilateral carotid bruits 10/13/2010   Allergic rhinitis 11/23/2007   Primary hypertension 04/14/2007   Coronary atherosclerosis 04/14/2007   Headache 04/14/2007   Thyroid  cancer (HCC) 03/23/1999    PCP: Jennifer Rollene BRAVO, MD  REFERRING PROVIDER: [name], MD  REFERRING DIAG: [email or web address] (ICD-10-CM) - Fall, initial encounter R29.6 (ICD-10-CM) - Recurrent falls R26.81 (ICD-10-CM) - Unsteady gait  THERAPY DIAG:  Impaired functional mobility, balance, gait, and endurance  Abnormal gait  Decreased strength of lower extremity  Rationale for Evaluation and Treatment: Rehabilitation  ONSET DATE: Fall occurred about 3 weeks ago, middle of May  SUBJECTIVE:   SUBJECTIVE STATEMENT: 10/06/23:  No reports of pain, falls or dizziness.  Keeps a check on her BP everyday. Compliance with HEP reported.  Stated increase pain in Rt knee of 6/10 this morning without known cause; request to complete NWB exercises this session.   Eval:  Pt states she was out looking for flowers at the store and was reaching for flowers and fell into [place]. Pt states no one was around and she fell again after trying to get up. Eventually got up on her own and went straight home. Pt states she has had about 5-6 falls, couple at home, one in [place] , one [place]. Pt states she feels a little nervous about falling. Pt states she has been staying in more often due to fear of falling, low back pain and hip  pain.  PERTINENT HISTORY: L hip replacement, about 10 years ago Diabetes, medicated PAIN:  Are you having pain? No, occasional back pain, stooping over is problematic   PRECAUTIONS: Fall  RED FLAGS: Occasional stomach pain after taking certain medication   WEIGHT BEARING RESTRICTIONS: No  FALLS:  Has patient fallen in last 6 months? Yes. Number of falls 2  LIVING ENVIRONMENT: Lives with: lives with their spouse Lives in: House/apartment Stairs: Yes: Internal: 15 steps; on right going up and External: 2, 5 steps; on right going up Has following equipment at home: Single point cane and has not been using it recently  OCCUPATION: retired  PLOF: Independent  PATIENT GOALS: decreased fear of falling, increased activity tolerance, increased LE strength  NEXT MD VISIT: none reported  OBJECTIVE:  Note: Objective measures were completed at Evaluation unless otherwise noted.  DIAGNOSTIC FINDINGS: N/A  PATIENT SURVEYS:  ABC scale 1280/1600  COGNITION: Overall cognitive status: Within functional limits for tasks assessed     SENSATION: WFL    LOWER EXTREMITY  ROM:  Active ROM Right eval Left eval  Hip flexion    Hip extension    Hip abduction    Hip adduction    Hip internal rotation    Hip external rotation    Knee flexion    Knee extension    Ankle dorsiflexion    Ankle plantarflexion    Ankle inversion    Ankle eversion     (Blank rows = not tested)  LOWER EXTREMITY MMT:  MMT Right eval Left eval  Hip flexion 3+ 3+  Hip extension 3 3  Hip abduction 3+ 3+  Hip adduction 3+ 3+  Hip internal rotation    Hip external rotation    Knee flexion 4 4  Knee extension 4 4  Ankle dorsiflexion 3+ 3+  Ankle plantarflexion    Ankle inversion    Ankle eversion     (Blank rows = not tested)   FUNCTIONAL TESTS:  5 times sit to stand: 16.79 seconds Timed up and go (TUG): 11.11 seconds SLS 09/07/23: R: 15.69s L: 8.98s  09/21/23: 326ft with reports of  increased LBP to 5/10 and radicular symptoms down posterior Lt LE to calf    GAIT: Distance walked: 75 feet to and from treatment area Assistive device utilized: None Level of assistance: Complete Independence Comments: Pt demonstrates decreased gait speed and stride length bilaterally.                                                                                                                                 TREATMENT DATE:  10/06/23 Supine:  bridging 2X10  SLR 10X each  LTR 10X each side  Hamstring stretch with towel 5X20 each LE Side lying clams 10X5 holds each  Hip abduction 10X each Nustep UE/LE seat 8 level 3, 5 minutes  10/04/23: Heel raises incline slope Toe raises decline slope Marching alternating 10x 3 no HHA  Partial tandem stance 2x 30 Sidestep with RTB 3RT Squat front of chair 10x SLS Rt 20, Lt 20 max Vector stance 3 x 5  09/21/23:  Reviewed goals Educated importance of HEP 323ft no AD   FGA: 1. GAIT LEVEL SURFACE Instructions: Walk at your normal speed from here to the next mark (6 m [20 ft]). Grading: Oneil the highest category that applies.-2 points 2. CHANGE IN GAIT SPEED Instructions: Begin walking at your normal pace (for 5 ft [1.5 m]). When I tell you "go," walk as fast as you can (for 1.5 m [5 ft]). When I tell you "slow," walk as slowly as you can (for 5 ft [1.5 m]). Grading: Oneil the highest category that applies. -1 point 3. GAIT WITH HORIZONTAL HEAD TURNS Instructions: Walk from here to the next mark 20 ft (6 m) away. Begin walking at your normal pace. Keep walking straight; after 3 steps, turn your head to the right and keep walking straight while looking to the right. After 3 more steps, turn  your head to the left and keep walking straight while looking left. Continue alternating looking right and left every 3 steps until you have completed 2 repetitions in each direction. Grading: Oneil the highest category that applies. -1 point 4.  GAIT WITH VERTICAL HEAD TURNS Instructions: Walk from here to the next mark (20 ft [6 m]). Begin walking at your normal pace. Keep walking straight; after 3 steps, tip your head up and keep walking straight while looking up. After 3 more steps, tip your head down, keep walking straight while looking down. Continue alternating looking up and down every 3 steps until you have completed 2 repetitions in each direction. Grading: Oneil the highest category that applies. -1 point 5. GAIT AND PIVOT TURN Instructions: Begin with walking at your normal pace. When I tell you, "turn and stop," turn as quickly as you can to face the opposite direction and stop. Grading: Oneil the highest category that applies. - 2 points 6. STEP OVER OBSTACLE Instructions: Begin walking at your normal speed. When you come to the shoe box, step over it, not around it, and keep walking. Grading: Oneil the highest category that applies. -1 point 7. GAIT WITH NARROW BASE OF SUPPORT Instructions: Walk on the floor with arms folded across the chest, feet aligned heel to toe in tandem for a distance of 3.6 m [12 ft]. The number of steps taken in a straight line are counted for a maximum of 10 steps. Grading: Oneil the highest category that applies. -1 point 8. GAIT WITH EYES CLOSED Instructions: Walk at your normal speed from here to the next mark (20 ft [6 m]) with your eyes closed. Grading: Oneil the highest category that applies. - 1 point 9. AMBULATING BACKWARDS Instructions: Walk backwards until I tell you to stop. Grading: Oneil the highest category that applies. -2 point 10. STEPS Instructions: Walk up these stairs as you would at home (ie, using the rail if necessary). At the top turn around and walk down. Grading: Oneil the highest category that applies. -1 point  Functional Gait Assessment: 13 / 30 = 43.3 %  10 STS standard chair height no HHA SLS Rt 25, Lt 26 Bridge 10x  09/07/2023   Evaluation: -ROM measured,  Strength assessed, HEP prescribed, pt educated on prognosis, findings, and importance of HEP compliance if given.        PATIENT EDUCATION:  Education details: Pt was educated on findings of PT evaluation, prognosis, frequency of therapy visits and rationale, attendance policy, and HEP if given.   Person educated: Patient Education method: Explanation, Verbal cues, and Handouts Education comprehension: verbalized understanding, verbal cues required, and needs further education  HOME EXERCISE PROGRAM: Access Code: [email or web address] URL: [email or web address] Date: 09/07/2023 Prepared by: Lang Ada  Exercises - Supine Bridge  - 1 x daily - 7 x weekly - 3 sets - 10 reps - Sit to Stand Without Arm Support  - 1 x daily - 7 x weekly - 3 sets - 10 reps - Standing Single Leg Stance with Counter Support  - 1 x daily - 7 x weekly - 1 sets - 3 reps - 30 seconds hold  10/04/23: - Side Stepping with Resistance at Thighs and Counter Support  - 2 x daily - 7 x weekly - 1 sets - 10 reps  ASSESSMENT:  CLINICAL IMPRESSION: 10/06/23:  focused on NWB exercises today per request due to increased Rt knee pain today.  Core stabilization cues, hold times and assist with repetition count required.  Added LE strengthening in sideling for hip abductors/glut.  Pt able to complete all exercises today without c/o pain.  Resume standing strengthening and stability challenges next session if pain is reduced in Rt knee. Pt will continue to benefit from skilled therapy.    Eval:  Patient is a 78 y.o. female who was seen today for physical therapy evaluation and treatment for [email or web address] (ICD-10-CM) - Fall, initial encounter R29.6 (ICD-10-CM) - Recurrent falls R26.81 (ICD-10-CM) - Unsteady gait.   Patient demonstrates decreased BLE strength, abnormal gait, and impaired balance. Patient also demonstrates difficulty with ambulation during today's session with decreased stride length and velocity noted. Patient also  demonstrates decreased SLS ability bilaterally left worse than right. Patient requires education on role of PT, HEP and importance of HEP compliance. Patient would benefit from skilled physical therapy for increased endurance with ambulation, increased LE strength, and balance for improved gait quality, return to higher level of function with ADLs, and progress towards therapy goals.   OBJECTIVE IMPAIRMENTS: Abnormal gait, decreased balance, decreased endurance, decreased strength, and pain.   ACTIVITY LIMITATIONS: carrying, lifting, standing, stairs, transfers, and locomotion level  PARTICIPATION LIMITATIONS: shopping, community activity, and yard work  PERSONAL FACTORS: Age, Past/current experiences, Time since onset of injury/illness/exacerbation, and 1 comorbidity: history of falls are also affecting patient's functional outcome.   REHAB POTENTIAL: Fair history of falling  CLINICAL DECISION MAKING: Stable/uncomplicated  EVALUATION COMPLEXITY: Low   GOALS: Goals reviewed with patient? No  SHORT TERM GOALS: Target date: 09/28/23  Pt will be independent with HEP in order to demonstrate participation in Physical Therapy POC.  Baseline: Goal status: INITIAL  2.  Pt will improve ABC score by 15% in order to demonstrate improved pain with functional goals and outcomes. Baseline: see objective Goal status: INITIAL  LONG TERM GOALS: Target date: 10/19/23  Pt will increase SLS time to at least 20 seconds on each LE in order to demonstrate improved balance with ADL/mobility.   Baseline: see objective.  Goal status: INITIAL  2.  Pt will improve 2 MWT by 40 in order to demonstrate improved functional mobility capacity in community setting.  Baseline: see objective.  Goal status: INITIAL  3.  Pt will improve ABC score by 30% from original score in order to demonstrate improved pain with functional goals and outcomes. Baseline: see objective.  Goal status: INITIAL  4.  Pt will  improve 5TSTS time by at least 2.3 seconds in order to demonstrate improved functional strength during functional transfers.  Baseline: see objective.  Goal status: INITIAL    PLAN:  PT FREQUENCY: 1-2x/week  PT DURATION: 6 weeks  PLANNED INTERVENTIONS: 97110-Therapeutic exercises, 97530- Therapeutic activity, [id number]- Neuromuscular re-education, 97535- Self Care, [id number]- Manual therapy, [phone]- Gait training, Patient/Family education, Balance training, Stair training, and DME instructions  PLAN FOR NEXT SESSION: Progress balance and LE strengthening. Resume standing exerises if Rt knee pain is reduced.   Greig KATHEE Fuse, PTA/CLT [facility] Ph: [phone]  1:46 PM, 10/06/23

## 2023-10-11 ENCOUNTER — Encounter (HOSPITAL_COMMUNITY): Payer: Self-pay

## 2023-10-11 ENCOUNTER — Ambulatory Visit (HOSPITAL_COMMUNITY)

## 2023-10-11 DIAGNOSIS — R29898 Other symptoms and signs involving the musculoskeletal system: Secondary | ICD-10-CM | POA: Diagnosis not present

## 2023-10-11 DIAGNOSIS — E113293 Type 2 diabetes mellitus with mild nonproliferative diabetic retinopathy without macular edema, bilateral: Secondary | ICD-10-CM | POA: Diagnosis not present

## 2023-10-11 DIAGNOSIS — Z7409 Other reduced mobility: Secondary | ICD-10-CM | POA: Diagnosis not present

## 2023-10-11 DIAGNOSIS — R269 Unspecified abnormalities of gait and mobility: Secondary | ICD-10-CM

## 2023-10-11 NOTE — Therapy (Signed)
 OUTPATIENT PHYSICAL THERAPY LOWER EXTREMITY TREATMENT   Patient Name: Jennifer Blackburn MRN: [id number] DOB:30-Jan-1946, 78 y.o., female Today's Date: 10/11/2023  END OF SESSION:  PT End of Session - 10/11/23 1435     Visit Number 5    Number of Visits 12    Date for PT Re-Evaluation 10/19/23    Authorization Type [employer]    Authorization Time Period cohere approved 10 visits from 09/21/23-10/19/23    Authorization - Visit Number 5    Authorization - Number of Visits 10    Progress Note Due on Visit 10    PT Start Time 1435    PT Stop Time 1514    PT Time Calculation (min) 39 min    Equipment Utilized During Treatment Gait belt    Activity Tolerance Patient tolerated treatment well;Patient limited by fatigue    Behavior During Therapy WFL for tasks assessed/performed          Past Medical History:  Diagnosis Date   Anxiety    Arthritis    CAD (coronary artery disease)    Depression    Diabetes mellitus type II    without complication   DJD (degenerative joint disease) of lumbar spine    Hypercholesteremia    Hyperlipidemia    Hypertension    benign    Hypothyroidism    Thyroid  cancer (HCC) 2001   Past Surgical History:  Procedure Laterality Date   ABDOMINAL HYSTERECTOMY     CARDIAC CATHETERIZATION     CATARACT EXTRACTION W/PHACO Right 07/17/2012   Procedure: CATARACT EXTRACTION PHACO AND INTRAOCULAR LENS PLACEMENT (IOC);  Surgeon: Cherene Mania, MD;  Location: [facility];  Service: Ophthalmology;  Laterality: Right;  CDE:25.51   CATARACT EXTRACTION W/PHACO Left 09/17/2016   Procedure: CATARACT EXTRACTION PHACO AND INTRAOCULAR LENS PLACEMENT LEFT EYE;  Surgeon: Mania Cherene, MD;  Location: [facility];  Service: Ophthalmology;  Laterality: Left;  CDE: 19.23   COLONOSCOPY     COLONOSCOPY N/A 01/15/2014   Procedure: COLONOSCOPY;  Surgeon: Lamar CHRISTELLA Hollingshead, MD;  Location: [facility];  Service: Endoscopy;  Laterality: N/A;  9:00 AM   COLONOSCOPY WITH PROPOFOL  N/A  01/25/2019   Procedure: COLONOSCOPY WITH PROPOFOL ;  Surgeon: Hollingshead Lamar CHRISTELLA, MD; diverticulosis in the entire colon, otherwise normal exam.  No repeat colonoscopy due to age.   DOPPLER ECHOCARDIOGRAPHY     ESOPHAGOGASTRODUODENOSCOPY (EGD) WITH PROPOFOL  N/A 01/25/2019   Procedure: ESOPHAGOGASTRODUODENOSCOPY (EGD) WITH PROPOFOL ;  Surgeon: Hollingshead Lamar CHRISTELLA, MD; normal esophagus without dilation due to inability to pass dilator beyond the hypopharynx, 1 small nonbleeding gastric ulcer s/p biopsied, otherwise normal exam.  Pathology with ulcer with reactive changes, no H. pylori, metaplasia, dysplasia, or malignancy.   ESOPHAGOGASTRODUODENOSCOPY (EGD) WITH PROPOFOL  N/A 08/21/2019   Procedure: ESOPHAGOGASTRODUODENOSCOPY (EGD) WITH PROPOFOL ;  Surgeon: Hollingshead Lamar CHRISTELLA, MD; Normal esophagus. Previously noted gastric ulcer completely healed. Normal examined duodenum.    JOINT REPLACEMENT  07/01/2010   left hip   KNEE SURGERY Right    arthroscopy   left hip replaced  07/01/2010   SPINE SURGERY  2006   cervical   stress dipyridamole myocardial perfusion     THYROIDECTOMY     TONSILLECTOMY     VESICOVAGINAL FISTULA CLOSURE W/ TAH     Patient Active Problem List   Diagnosis Date Noted   Recurrent falls 08/03/2023   Unsteady gait 08/03/2023   Abdominal pain 05/04/2023   Depression, major, single episode, in partial remission (HCC) 03/23/2023   Encounter for annual  physical exam 12/30/2022   Stress at home 10/26/2022   Diarrhea of presumed infectious origin 06/06/2022   At risk for abnormal blood glucose level 06/06/2022   Neck pain with history of cervical spinal surgery 12/16/2021   Fall 12/01/2021   Left knee pain 01/25/2021   Constipation 06/06/2020   Leg swelling 11/18/2019   Pruritus 11/18/2019   Muscle pain 02/07/2019   Dysphagia 11/11/2018   Overweight (BMI 25.0-29.9) 11/05/2018   Cervical spondylosis with radiculopathy 06/20/2017   Hyperlipidemia    CAD (coronary artery disease)     Anxiety    Osteoarthritis 02/17/2017   Family history of coronary artery disease in father 02/17/2017   Lumbar spondylosis with myelopathy 07/18/2016   Laryngopharyngeal reflux (LPR) 06/24/2016   Type 2 diabetes mellitus with other specified complication (HCC) 01/13/2015   GERD (gastroesophageal reflux disease) 04/11/2014   At high risk for falls 12/31/2013   Postsurgical hypothyroidism 01/05/2013   Back pain with radiculopathy 11/28/2012   Vitamin D  deficiency 10/14/2010   Bilateral carotid bruits 10/13/2010   Allergic rhinitis 11/23/2007   Primary hypertension 04/14/2007   Coronary atherosclerosis 04/14/2007   Headache 04/14/2007   Thyroid  cancer (HCC) 03/23/1999    PCP: [name], MD  REFERRING PROVIDER: [name], MD  REFERRING DIAG: W19.CHERENE (ICD-10-CM) - Fall, initial encounter R29.6 (ICD-10-CM) - Recurrent falls R26.81 (ICD-10-CM) - Unsteady gait  THERAPY DIAG:  Impaired functional mobility, balance, gait, and endurance  Abnormal gait  Decreased strength of lower extremity  Rationale for Evaluation and Treatment: Rehabilitation  ONSET DATE: Fall occurred about 3 weeks ago, middle of May  SUBJECTIVE:   SUBJECTIVE STATEMENT: 10/11/23:  About a week ago woke up with Rt knee pain, constant pain scale 6/10.  Eval:  Pt states she was out looking for flowers at the store and was reaching for flowers and fell into [place]. Pt states no one was around and she fell again after trying to get up. Eventually got up on her own and went straight home. Pt states she has had about 5-6 falls, couple at home, one in [place] , [place]. Pt states she feels a little nervous about falling. Pt states she has been staying in more often due to fear of falling, low back pain and hip pain.  PERTINENT HISTORY: L hip replacement, about 10 years ago Diabetes, medicated PAIN:  Are you having pain? No, occasional back pain, stooping over is problematic    PRECAUTIONS: Fall  RED FLAGS: Occasional stomach pain after taking certain medication   WEIGHT BEARING RESTRICTIONS: No  FALLS:  Has patient fallen in last 6 months? Yes. Number of falls 2  LIVING ENVIRONMENT: Lives with: lives with their spouse Lives in: House/apartment Stairs: Yes: Internal: 15 steps; on right going up and External: 2, 5 steps; on right going up Has following equipment at home: Single point cane and has not been using it recently  OCCUPATION: retired  PLOF: Independent  PATIENT GOALS: decreased fear of falling, increased activity tolerance, increased LE strength  NEXT MD VISIT: none reported  OBJECTIVE:  Note: Objective measures were completed at Evaluation unless otherwise noted.  DIAGNOSTIC FINDINGS: N/A  PATIENT SURVEYS:  ABC scale 1280/1600  COGNITION: Overall cognitive status: Within functional limits for tasks assessed     SENSATION: WFL    LOWER EXTREMITY ROM:  Active ROM Right eval Left eval  Hip flexion    Hip extension    Hip abduction    Hip adduction  Hip internal rotation    Hip external rotation    Knee flexion    Knee extension    Ankle dorsiflexion    Ankle plantarflexion    Ankle inversion    Ankle eversion     (Blank rows = not tested)  LOWER EXTREMITY MMT:  MMT Right eval Left eval  Hip flexion 3+ 3+  Hip extension 3 3  Hip abduction 3+ 3+  Hip adduction 3+ 3+  Hip internal rotation    Hip external rotation    Knee flexion 4 4  Knee extension 4 4  Ankle dorsiflexion 3+ 3+  Ankle plantarflexion    Ankle inversion    Ankle eversion     (Blank rows = not tested)   FUNCTIONAL TESTS:  5 times sit to stand: 16.79 seconds Timed up and go (TUG): 11.11 seconds SLS 09/07/23: R: 15.69s L: 8.98s  09/21/23: 313ft with reports of increased LBP to 5/10 and radicular symptoms down posterior Lt LE to calf    GAIT: Distance walked: 75 feet to and from treatment area Assistive device utilized: None Level of  assistance: Complete Independence Comments: Pt demonstrates decreased gait speed and stride length bilaterally.                                                                                                                                 TREATMENT DATE:  10/11/23: Standing: Heel raises incline slope 15x  Toe raises decline slope 15x Tandem gait down black line 2RT Balance beam forward tandem then sidestep 2RT each Squats front of chair 10x Lunges onto 6in10x 10/06/23 Supine:  bridging 2X10  SLR 10X each  LTR 10X each side  Hamstring stretch with towel 5X20 each LE Side lying clams 10X5 holds each  Hip abduction 10X each Nustep UE/LE seat 8 level 3, 5 minutes  10/04/23: Heel raises incline slope Toe raises decline slope Marching alternating 10x 3 no HHA  Partial tandem stance 2x 30 Sidestep with RTB 3RT Squat front of chair 10x SLS Rt 20, Lt 20 max Vector stance 3 x 5  09/21/23:  Reviewed goals Educated importance of HEP 342ft no AD   FGA: 1. GAIT LEVEL SURFACE Instructions: Walk at your normal speed from here to the next mark (6 m [20 ft]). Grading: Oneil the highest category that applies.-2 points 2. CHANGE IN GAIT SPEED Instructions: Begin walking at your normal pace (for 5 ft [1.5 m]). When I tell you "go," walk as fast as you can (for 1.5 m [5 ft]). When I tell you "slow," walk as slowly as you can (for 5 ft [1.5 m]). Grading: Oneil the highest category that applies. -1 point 3. GAIT WITH HORIZONTAL HEAD TURNS Instructions: Walk from here to the next mark 20 ft (6 m) away. Begin walking at your normal pace. Keep walking straight; after 3 steps, turn your head to the right and keep walking straight while looking to the  right. After 3 more steps, turn your head to the left and keep walking straight while looking left. Continue alternating looking right and left every 3 steps until you have completed 2 repetitions in each direction. Grading: Oneil the highest  category that applies. -1 point 4. GAIT WITH VERTICAL HEAD TURNS Instructions: Walk from here to the next mark (20 ft [6 m]). Begin walking at your normal pace. Keep walking straight; after 3 steps, tip your head up and keep walking straight while looking up. After 3 more steps, tip your head down, keep walking straight while looking down. Continue alternating looking up and down every 3 steps until you have completed 2 repetitions in each direction. Grading: Oneil the highest category that applies. -1 point 5. GAIT AND PIVOT TURN Instructions: Begin with walking at your normal pace. When I tell you, "turn and stop," turn as quickly as you can to face the opposite direction and stop. Grading: Oneil the highest category that applies. - 2 points 6. STEP OVER OBSTACLE Instructions: Begin walking at your normal speed. When you come to the shoe box, step over it, not around it, and keep walking. Grading: Oneil the highest category that applies. -1 point 7. GAIT WITH NARROW BASE OF SUPPORT Instructions: Walk on the floor with arms folded across the chest, feet aligned heel to toe in tandem for a distance of 3.6 m [12 ft]. The number of steps taken in a straight line are counted for a maximum of 10 steps. Grading: Oneil the highest category that applies. -1 point 8. GAIT WITH EYES CLOSED Instructions: Walk at your normal speed from here to the next mark (20 ft [6 m]) with your eyes closed. Grading: Oneil the highest category that applies. - 1 point 9. AMBULATING BACKWARDS Instructions: Walk backwards until I tell you to stop. Grading: Oneil the highest category that applies. -2 point 10. STEPS Instructions: Walk up these stairs as you would at home (ie, using the rail if necessary). At the top turn around and walk down. Grading: Oneil the highest category that applies. -1 point  Functional Gait Assessment: 13 / 30 = 43.3 %  10 STS standard chair height no HHA SLS Rt 25, Lt 26 Bridge 10x  09/07/2023    Evaluation: -ROM measured, Strength assessed, HEP prescribed, pt educated on prognosis, findings, and importance of HEP compliance if given.        PATIENT EDUCATION:  Education details: Pt was educated on findings of PT evaluation, prognosis, frequency of therapy visits and rationale, attendance policy, and HEP if given.   Person educated: Patient Education method: Explanation, Verbal cues, and Handouts Education comprehension: verbalized understanding, verbal cues required, and needs further education  HOME EXERCISE PROGRAM: Access Code: [email or web address] URL: [email or web address] Date: 09/07/2023 Prepared by: Lang Ada  Exercises - Supine Bridge  - 1 x daily - 7 x weekly - 3 sets - 10 reps - Sit to Stand Without Arm Support  - 1 x daily - 7 x weekly - 3 sets - 10 reps - Standing Single Leg Stance with Counter Support  - 1 x daily - 7 x weekly - 1 sets - 3 reps - 30 seconds hold  10/04/23: - Side Stepping with Resistance at Thighs and Counter Support  - 2 x daily - 7 x weekly - 1 sets - 10 reps  ASSESSMENT:  CLINICAL IMPRESSION: 10/11/23:  Session focus with balance and functional strengthening.  Pt presents with improved stability with tandem gait on solid surface with  no LOB.  Progressed to dynamics surface with min A required for LOB, cueing to slow down with improved control.  Added lunges for functional strengthening.  Held SLS based exercises this session as pt has had Rt LE pain for about a week ago.  No reports of pain through session.  Eval:  Patient is a 78 y.o. female who was seen today for physical therapy evaluation and treatment for W19.CHERENE (ICD-10-CM) - Fall, initial encounter R29.6 (ICD-10-CM) - Recurrent falls R26.81 (ICD-10-CM) - Unsteady gait.   Patient demonstrates decreased BLE strength, abnormal gait, and impaired balance. Patient also demonstrates difficulty with ambulation during today's session with decreased stride length and velocity noted.  Patient also demonstrates decreased SLS ability bilaterally left worse than right. Patient requires education on role of PT, HEP and importance of HEP compliance. Patient would benefit from skilled physical therapy for increased endurance with ambulation, increased LE strength, and balance for improved gait quality, return to higher level of function with ADLs, and progress towards therapy goals.   OBJECTIVE IMPAIRMENTS: Abnormal gait, decreased balance, decreased endurance, decreased strength, and pain.   ACTIVITY LIMITATIONS: carrying, lifting, standing, stairs, transfers, and locomotion level  PARTICIPATION LIMITATIONS: shopping, community activity, and yard work  PERSONAL FACTORS: Age, Past/current experiences, Time since onset of injury/illness/exacerbation, and 1 comorbidity: history of falls are also affecting patient's functional outcome.   REHAB POTENTIAL: Fair history of falling  CLINICAL DECISION MAKING: Stable/uncomplicated  EVALUATION COMPLEXITY: Low   GOALS: Goals reviewed with patient? No  SHORT TERM GOALS: Target date: 09/28/23  Pt will be independent with HEP in order to demonstrate participation in Physical Therapy POC.  Baseline: Goal status: INITIAL  2.  Pt will improve ABC score by 15% in order to demonstrate improved pain with functional goals and outcomes. Baseline: see objective Goal status: INITIAL  LONG TERM GOALS: Target date: 10/19/23  Pt will increase SLS time to at least 20 seconds on each LE in order to demonstrate improved balance with ADL/mobility.   Baseline: see objective.  Goal status: INITIAL  2.  Pt will improve 2 MWT by 40 in order to demonstrate improved functional mobility capacity in community setting.  Baseline: see objective.  Goal status: INITIAL  3.  Pt will improve ABC score by 30% from original score in order to demonstrate improved pain with functional goals and outcomes. Baseline: see objective.  Goal status: INITIAL  4.   Pt will improve 5TSTS time by at least 2.3 seconds in order to demonstrate improved functional strength during functional transfers.  Baseline: see objective.  Goal status: INITIAL    PLAN:  PT FREQUENCY: 1-2x/week  PT DURATION: 6 weeks  PLANNED INTERVENTIONS: 97110-Therapeutic exercises, 97530- Therapeutic activity, [id number]- Neuromuscular re-education, 97535- Self Care, [id number]- Manual therapy, [phone]- Gait training, Patient/Family education, Balance training, Stair training, and DME instructions  PLAN FOR NEXT SESSION: Progress balance and LE strengthening. Resume standing exerises if Rt knee pain is reduced.    Augustin Mclean, LPTA/CLT; WILLAIM [phone]  3:14 PM, 10/11/23

## 2023-10-12 ENCOUNTER — Ambulatory Visit: Admitting: Family Medicine

## 2023-10-13 ENCOUNTER — Encounter (HOSPITAL_COMMUNITY)

## 2023-10-13 ENCOUNTER — Other Ambulatory Visit: Payer: Self-pay | Admitting: Family Medicine

## 2023-10-13 ENCOUNTER — Telehealth: Payer: Self-pay

## 2023-10-13 NOTE — Telephone Encounter (Signed)
 Patient Assistance  Medication: Ozempic   Dosage: 2 mg

## 2023-10-18 ENCOUNTER — Encounter (HOSPITAL_COMMUNITY): Payer: Self-pay

## 2023-10-18 ENCOUNTER — Ambulatory Visit (HOSPITAL_COMMUNITY): Admitting: Psychiatry

## 2023-10-19 ENCOUNTER — Ambulatory Visit (HOSPITAL_COMMUNITY)

## 2023-10-19 ENCOUNTER — Encounter (HOSPITAL_COMMUNITY): Payer: Self-pay

## 2023-10-19 DIAGNOSIS — R269 Unspecified abnormalities of gait and mobility: Secondary | ICD-10-CM

## 2023-10-19 DIAGNOSIS — R29898 Other symptoms and signs involving the musculoskeletal system: Secondary | ICD-10-CM

## 2023-10-19 DIAGNOSIS — Z7409 Other reduced mobility: Secondary | ICD-10-CM

## 2023-10-19 NOTE — Therapy (Addendum)
 OUTPATIENT PHYSICAL THERAPY LOWER EXTREMITY TREATMENT/PROGRESS  Progress Note Reporting Period 09/07/23 to 10/19/23  See note below for Objective Data and Assessment of Progress/Goals.    Patient Name: Jennifer Blackburn MRN: [id number] DOB:1945/04/13, 78 y.o., female Today's Date: 10/19/2023  END OF SESSION:  PT End of Session - 10/19/23 1436     Visit Number 6    Number of Visits 12    Date for PT Re-Evaluation 11/02/23    Authorization Type [employer]    Authorization Time Period seeking date change from 07/30-08/15    Authorization - Visit Number 6    Authorization - Number of Visits 10    Progress Note Due on Visit 10    PT Start Time 1437    PT Stop Time 1512    PT Time Calculation (min) 35 min    Equipment Utilized During Treatment Gait belt    Activity Tolerance Patient tolerated treatment well;Patient limited by fatigue    Behavior During Therapy WFL for tasks assessed/performed           Past Medical History:  Diagnosis Date   Anxiety    Arthritis    CAD (coronary artery disease)    Depression    Diabetes mellitus type II    without complication   DJD (degenerative joint disease) of lumbar spine    Hypercholesteremia    Hyperlipidemia    Hypertension    benign    Hypothyroidism    Thyroid  cancer (HCC) 2001   Past Surgical History:  Procedure Laterality Date   ABDOMINAL HYSTERECTOMY     CARDIAC CATHETERIZATION     CATARACT EXTRACTION W/PHACO Right 07/17/2012   Procedure: CATARACT EXTRACTION PHACO AND INTRAOCULAR LENS PLACEMENT (IOC);  Surgeon: Cherene Mania, MD;  Location: [facility];  Service: Ophthalmology;  Laterality: Right;  CDE:25.51   CATARACT EXTRACTION W/PHACO Left 09/17/2016   Procedure: CATARACT EXTRACTION PHACO AND INTRAOCULAR LENS PLACEMENT LEFT EYE;  Surgeon: Mania Cherene, MD;  Location: [facility];  Service: Ophthalmology;  Laterality: Left;  CDE: 19.23   COLONOSCOPY     COLONOSCOPY N/A 01/15/2014   Procedure: COLONOSCOPY;  Surgeon: Lamar CHRISTELLA Hollingshead, MD;  Location: [facility];  Service: Endoscopy;  Laterality: N/A;  9:00 AM   COLONOSCOPY WITH PROPOFOL  N/A 01/25/2019   Procedure: COLONOSCOPY WITH PROPOFOL ;  Surgeon: Hollingshead Lamar CHRISTELLA, MD; diverticulosis in the entire colon, otherwise normal exam.  No repeat colonoscopy due to age.   DOPPLER ECHOCARDIOGRAPHY     ESOPHAGOGASTRODUODENOSCOPY (EGD) WITH PROPOFOL  N/A 01/25/2019   Procedure: ESOPHAGOGASTRODUODENOSCOPY (EGD) WITH PROPOFOL ;  Surgeon: Hollingshead Lamar CHRISTELLA, MD; normal esophagus without dilation due to inability to pass dilator beyond the hypopharynx, 1 small nonbleeding gastric ulcer s/p biopsied, otherwise normal exam.  Pathology with ulcer with reactive changes, no H. pylori, metaplasia, dysplasia, or malignancy.   ESOPHAGOGASTRODUODENOSCOPY (EGD) WITH PROPOFOL  N/A 08/21/2019   Procedure: ESOPHAGOGASTRODUODENOSCOPY (EGD) WITH PROPOFOL ;  Surgeon: Hollingshead Lamar CHRISTELLA, MD; Normal esophagus. Previously noted gastric ulcer completely healed. Normal examined duodenum.    JOINT REPLACEMENT  07/01/2010   left hip   KNEE SURGERY Right    arthroscopy   left hip replaced  07/01/2010   SPINE SURGERY  2006   cervical   stress dipyridamole myocardial perfusion     THYROIDECTOMY     TONSILLECTOMY     VESICOVAGINAL FISTULA CLOSURE W/ TAH     Patient Active Problem List   Diagnosis Date Noted   Recurrent falls 08/03/2023   Unsteady gait 08/03/2023  Abdominal pain 05/04/2023   Depression, major, single episode, in partial remission (HCC) 03/23/2023   Encounter for annual physical exam 12/30/2022   Stress at home 10/26/2022   Diarrhea of presumed infectious origin 06/06/2022   At risk for abnormal blood glucose level 06/06/2022   Neck pain with history of cervical spinal surgery 12/16/2021   Fall 12/01/2021   Left knee pain 01/25/2021   Constipation 06/06/2020   Leg swelling 11/18/2019   Pruritus 11/18/2019   Muscle pain 02/07/2019   Dysphagia 11/11/2018   Overweight (BMI 25.0-29.9)  11/05/2018   Cervical spondylosis with radiculopathy 06/20/2017   Hyperlipidemia    CAD (coronary artery disease)    Anxiety    Osteoarthritis 02/17/2017   Family history of coronary artery disease in father 02/17/2017   Lumbar spondylosis with myelopathy 07/18/2016   Laryngopharyngeal reflux (LPR) 06/24/2016   Type 2 diabetes mellitus with other specified complication (HCC) 01/13/2015   GERD (gastroesophageal reflux disease) 04/11/2014   At high risk for falls 12/31/2013   Postsurgical hypothyroidism 01/05/2013   Back pain with radiculopathy 11/28/2012   Vitamin D  deficiency 10/14/2010   Bilateral carotid bruits 10/13/2010   Allergic rhinitis 11/23/2007   Primary hypertension 04/14/2007   Coronary atherosclerosis 04/14/2007   Headache 04/14/2007   Thyroid  cancer (HCC) 03/23/1999    PCP: [name], MD  REFERRING PROVIDER: [name], MD  REFERRING DIAG: W19.CHERENE (ICD-10-CM) - Fall, initial encounter R29.6 (ICD-10-CM) - Recurrent falls R26.81 (ICD-10-CM) - Unsteady gait  THERAPY DIAG:  Impaired functional mobility, balance, gait, and endurance - Plan: PT plan of care cert/re-cert  Abnormal gait - Plan: PT plan of care cert/re-cert  Decreased strength of lower extremity - Plan: PT plan of care cert/re-cert  Rationale for Evaluation and Treatment: Rehabilitation  ONSET DATE: Fall occurred about 3 weeks ago, middle of May  SUBJECTIVE:   SUBJECTIVE STATEMENT: Pt states she is not in any pain but could because she is sedated from dentist and has ice patch on leg. Pt states she thinks she will be ready to be independent with care after a couple of more sessions.   Eval:  Pt states she was out looking for flowers at the store and was reaching for flowers and fell into [place]. Pt states no one was around and she fell again after trying to get up. Eventually got up on her own and went straight home. Pt states she has had about 5-6 falls, couple at home,  one in [place] , one in [place]. Pt states she feels a little nervous about falling. Pt states she has been staying in more often due to fear of falling, low back pain and hip pain.  PERTINENT HISTORY: L hip replacement, about 10 years ago Diabetes, medicated PAIN:  Are you having pain? No, occasional back pain, stooping over is problematic   PRECAUTIONS: Fall  RED FLAGS: Occasional stomach pain after taking certain medication   WEIGHT BEARING RESTRICTIONS: No  FALLS:  Has patient fallen in last 6 months? Yes. Number of falls 2  LIVING ENVIRONMENT: Lives with: lives with their spouse Lives in: House/apartment Stairs: Yes: Internal: 15 steps; on right going up and External: 2, 5 steps; on right going up Has following equipment at home: Single point cane and has not been using it recently  OCCUPATION: retired  PLOF: Independent  PATIENT GOALS: decreased fear of falling, increased activity tolerance, increased LE strength  NEXT MD VISIT: none reported  OBJECTIVE:  Note: Objective measures were  completed at Evaluation unless otherwise noted.  DIAGNOSTIC FINDINGS: N/A  PATIENT SURVEYS:  ABC scale 1280/1600 10/19/23: 1340 / 1600 = 83.8 % COGNITION: Overall cognitive status: Within functional limits for tasks assessed     SENSATION: WFL    LOWER EXTREMITY ROM:  Active ROM Right eval Left eval  Hip flexion    Hip extension    Hip abduction    Hip adduction    Hip internal rotation    Hip external rotation    Knee flexion    Knee extension    Ankle dorsiflexion    Ankle plantarflexion    Ankle inversion    Ankle eversion     (Blank rows = not tested)  LOWER EXTREMITY MMT:  MMT Right eval Left eval  Hip flexion 3+ 3+  Hip extension 3 3  Hip abduction 3+ 3+  Hip adduction 3+ 3+  Hip internal rotation    Hip external rotation    Knee flexion 4 4  Knee extension 4 4  Ankle dorsiflexion 3+ 3+  Ankle plantarflexion    Ankle inversion     Ankle eversion     (Blank rows = not tested)   FUNCTIONAL TESTS:  5 times sit to stand: 16.79 seconds Timed up and go (TUG): 11.11 seconds SLS 09/07/23: R: 15.69s L: 8.98s  09/21/23: 316ft with reports of increased LBP to 5/10 and radicular symptoms down posterior Lt LE to calf    10/19/23: : 311ft no AD, no pain reported, SOB felt SLS 10/19/23: R: 13.96 L: 13.34 barefooted 5TSTS: 14 seconds, no increased pain  GAIT: Distance walked: 75 feet to and from treatment area Assistive device utilized: None Level of assistance: Complete Independence Comments: Pt demonstrates decreased gait speed and stride length bilaterally.                                                                                                                                 TREATMENT DATE:  10/19/2023  Progress Note: 5TSTS, , SLS Therapeutic Exercise: -Leg press, 1 set of 8 reps, plate 3, pt cued for eccentric control -Step up and overs, 8 inch step, 1 set of 8 reps bilaterally, pt cued for decreased UE support -SLS trials, 5 bouts each side - -Sit to stands, 2 sets of 10 reps, throughout session    10/11/23: Standing: Heel raises incline slope 15x  Toe raises decline slope 15x Tandem gait down black line 2RT Balance beam forward tandem then sidestep 2RT each Squats front of chair 10x Lunges onto 6in10x 10/06/23 Supine:  bridging 2X10  SLR 10X each  LTR 10X each side  Hamstring stretch with towel 5X20 each LE Side lying clams 10X5 holds each  Hip abduction 10X each Nustep UE/LE seat 8 level 3, 5 minutes   PATIENT EDUCATION:  Education details: Pt was educated on findings of PT evaluation, prognosis, frequency of therapy visits and rationale, attendance policy, and HEP if given.  Person educated: Patient Education method: Explanation, Verbal cues, and Handouts Education comprehension: verbalized understanding, verbal cues required, and needs further education  HOME EXERCISE  PROGRAM: Access Code: [email or web address] URL: [email or web address] Date: 09/07/2023 Prepared by: Lang Ada  Exercises - Supine Bridge  - 1 x daily - 7 x weekly - 3 sets - 10 reps - Sit to Stand Without Arm Support  - 1 x daily - 7 x weekly - 3 sets - 10 reps - Standing Single Leg Stance with Counter Support  - 1 x daily - 7 x weekly - 1 sets - 3 reps - 30 seconds hold  10/04/23: - Side Stepping with Resistance at Thighs and Counter Support  - 2 x daily - 7 x weekly - 1 sets - 10 reps  ASSESSMENT:  CLINICAL IMPRESSION: Patient continues to demonstrate decreased LE strength, decreased gait quality and balance. Patient also demonstrates decreased endurance with increased intensity of exercise during today's session. Patient able to progress dynamic balance and LE strength resistance training today with Leg press and step up variations, good performance with verbal cueing. Pt continues to demonstrates difficulty with step ambulation leading with left lower extremity, requires at least on UE support. Pt has only met 2/6 goals set at the start of therapy, stressed importance of HEP compliance and increased walking at home. Patient would continue to benefit from skilled physical therapy for increased endurance with ambulation, increased LE strength, and improved balance for improved quality of life, improved independence with gait training and continued progress towards therapy goals.   Eval:  Patient is a 78 y.o. female who was seen today for physical therapy evaluation and treatment for W19.CHERENE (ICD-10-CM) - Fall, initial encounter R29.6 (ICD-10-CM) - Recurrent falls R26.81 (ICD-10-CM) - Unsteady gait. Patient demonstrates decreased BLE strength, abnormal gait, and impaired balance. Patient also demonstrates difficulty with ambulation during today's session with decreased stride length and velocity noted. Patient also demonstrates decreased SLS ability bilaterally left worse than right. Patient  requires education on role of PT, HEP and importance of HEP compliance. Patient would benefit from skilled physical therapy for increased endurance with ambulation, increased LE strength, and balance for improved gait quality, return to higher level of function with ADLs, and progress towards therapy goals.   OBJECTIVE IMPAIRMENTS: Abnormal gait, decreased balance, decreased endurance, decreased strength, and pain.   ACTIVITY LIMITATIONS: carrying, lifting, standing, stairs, transfers, and locomotion level  PARTICIPATION LIMITATIONS: shopping, community activity, and yard work  PERSONAL FACTORS: Age, Past/current experiences, Time since onset of injury/illness/exacerbation, and 1 comorbidity: history of falls are also affecting patient's functional outcome.   REHAB POTENTIAL: Fair history of falling  CLINICAL DECISION MAKING: Stable/uncomplicated  EVALUATION COMPLEXITY: Low   GOALS: Goals reviewed with patient? No  SHORT TERM GOALS: Target date: 09/28/23  Pt will be independent with HEP in order to demonstrate participation in Physical Therapy POC.  Baseline: Goal status: MET  2.  Pt will improve ABC score by 15% in order to demonstrate improved pain with functional goals and outcomes. Baseline: see objective Goal status: IN PROGRESS  LONG TERM GOALS: Target date: 10/19/23  Pt will increase SLS time to at least 20 seconds on each LE in order to demonstrate improved balance with ADL/mobility.   Baseline: see objective.  Goal status: IN PROGRESS  2.  Pt will improve 2 MWT by 40 in order to demonstrate improved functional mobility capacity in community setting.  Baseline: see objective.  Goal status: IN PROGRESS  3.  Pt will improve  ABC score by 30% from original score in order to demonstrate improved pain with functional goals and outcomes. Baseline: see objective.  Goal status: IN PROGRESS  4.  Pt will improve 5TSTS time by at least 2.3 seconds in order to demonstrate  improved functional strength during functional transfers.  Baseline: see objective.  Goal status: MET    PLAN:  PT FREQUENCY: 1-2x/week  PT DURATION: 2 weeks  PLANNED INTERVENTIONS: 97110-Therapeutic exercises, 97530- Therapeutic activity, [id number]- Neuromuscular re-education, 97535- Self Care, [id number]- Manual therapy, [phone]- Gait training, Patient/Family education, Balance training, Stair training, and DME instructions  PLAN FOR NEXT SESSION: Progress balance and LE strengthening. Resume standing exerises if Rt knee pain is reduced. Possible discharge the 13th of August. Request extended dates from insurance to 11/04/23.   Lang Ada, PT, DPT [facility] Office: [phone] 3:39 PM, 10/19/23  Mylene Barrows Request   Referring diagnosis code (ICD 10)? W19.CHERENE (ICD-10-CM) - Fall, initial encounter R29.6 (ICD-10-CM) - Recurrent falls R26.81 (ICD-10-CM) - Unsteady gait Treatment diagnosis codes (ICD 10)? (if different than referring diagnosis)  Z74.09 ; R26.9 ; R29.898  What was this (referring dx) caused by? []  Surgery [x]  Fall []  Ongoing issue []  Arthritis []  Other: ____________   Laterality: []  Rt []  Lt []  Both   Deficits: [x]  Pain []  Stiffness [x]  Weakness []  Edema [x]  Balance Deficits []  Coordination [x]  Gait Disturbance []  ROM []  Other             Functional Tool Score: ABC scale 1340/1600   CPT codes: See Planned Interventions listed in the Plan section of the Evaluation.

## 2023-10-26 ENCOUNTER — Encounter (HOSPITAL_COMMUNITY): Payer: Self-pay

## 2023-10-26 ENCOUNTER — Ambulatory Visit (HOSPITAL_COMMUNITY): Attending: Family Medicine

## 2023-10-26 DIAGNOSIS — R269 Unspecified abnormalities of gait and mobility: Secondary | ICD-10-CM | POA: Diagnosis not present

## 2023-10-26 DIAGNOSIS — Z7409 Other reduced mobility: Secondary | ICD-10-CM | POA: Diagnosis not present

## 2023-10-26 DIAGNOSIS — R29898 Other symptoms and signs involving the musculoskeletal system: Secondary | ICD-10-CM | POA: Diagnosis not present

## 2023-10-26 NOTE — Therapy (Signed)
 OUTPATIENT PHYSICAL THERAPY LOWER EXTREMITY TREATMENT  Patient Name: Jennifer Blackburn MRN: [id number] DOB:[birth year], 78 y.o., female Today's Date: 10/26/2023  END OF SESSION:  PT End of Session - 10/26/23 1438     Visit Number 7    Number of Visits 15    Date for PT Re-Evaluation 11/02/23    Authorization Type [employer]    Authorization Time Period 8 visits approved from 10/26/23-01/24/24    Authorization - Visit Number 7    Authorization - Number of Visits 15    Progress Note Due on Visit 10    PT Start Time 1439    PT Stop Time 1515    PT Time Calculation (min) 36 min    Equipment Utilized During Treatment Gait belt    Activity Tolerance Patient tolerated treatment well;Patient limited by fatigue    Behavior During Therapy WFL for tasks assessed/performed            Past Medical History:  Diagnosis Date   Anxiety    Arthritis    CAD (coronary artery disease)    Depression    Diabetes mellitus type II    without complication   DJD (degenerative joint disease) of lumbar spine    Hypercholesteremia    Hyperlipidemia    Hypertension    benign    Hypothyroidism    Thyroid  cancer (HCC) 2001   Past Surgical History:  Procedure Laterality Date   ABDOMINAL HYSTERECTOMY     CARDIAC CATHETERIZATION     CATARACT EXTRACTION W/PHACO Right 07/17/2012   Procedure: CATARACT EXTRACTION PHACO AND INTRAOCULAR LENS PLACEMENT (IOC);  Surgeon: Cherene Mania, MD;  Location: [facility];  Service: Ophthalmology;  Laterality: Right;  CDE:25.51   CATARACT EXTRACTION W/PHACO Left 09/17/2016   Procedure: CATARACT EXTRACTION PHACO AND INTRAOCULAR LENS PLACEMENT LEFT EYE;  Surgeon: Mania Cherene, MD;  Location: [facility];  Service: Ophthalmology;  Laterality: Left;  CDE: 19.23   COLONOSCOPY     COLONOSCOPY N/A 01/15/2014   Procedure: COLONOSCOPY;  Surgeon: Lamar CHRISTELLA Hollingshead, MD;  Location: [facility];  Service: Endoscopy;  Laterality: N/A;  9:00 AM   COLONOSCOPY WITH PROPOFOL  N/A 01/25/2019    Procedure: COLONOSCOPY WITH PROPOFOL ;  Surgeon: Hollingshead Lamar CHRISTELLA, MD; diverticulosis in the entire colon, otherwise normal exam.  No repeat colonoscopy due to age.   DOPPLER ECHOCARDIOGRAPHY     ESOPHAGOGASTRODUODENOSCOPY (EGD) WITH PROPOFOL  N/A 01/25/2019   Procedure: ESOPHAGOGASTRODUODENOSCOPY (EGD) WITH PROPOFOL ;  Surgeon: Hollingshead Lamar CHRISTELLA, MD; normal esophagus without dilation due to inability to pass dilator beyond the hypopharynx, 1 small nonbleeding gastric ulcer s/p biopsied, otherwise normal exam.  Pathology with ulcer with reactive changes, no H. pylori, metaplasia, dysplasia, or malignancy.   ESOPHAGOGASTRODUODENOSCOPY (EGD) WITH PROPOFOL  N/A 08/21/2019   Procedure: ESOPHAGOGASTRODUODENOSCOPY (EGD) WITH PROPOFOL ;  Surgeon: Hollingshead Lamar CHRISTELLA, MD; Normal esophagus. Previously noted gastric ulcer completely healed. Normal examined duodenum.    JOINT REPLACEMENT  07/01/2010   left hip   KNEE SURGERY Right    arthroscopy   left hip replaced  07/01/2010   SPINE SURGERY  2006   cervical   stress dipyridamole myocardial perfusion     THYROIDECTOMY     TONSILLECTOMY     VESICOVAGINAL FISTULA CLOSURE W/ TAH     Patient Active Problem List   Diagnosis Date Noted   Recurrent falls 08/03/2023   Unsteady gait 08/03/2023   Abdominal pain 05/04/2023   Depression, major, single episode, in partial remission (HCC) 03/23/2023   Encounter for annual  physical exam 12/30/2022   Stress at home 10/26/2022   Diarrhea of presumed infectious origin 06/06/2022   At risk for abnormal blood glucose level 06/06/2022   Neck pain with history of cervical spinal surgery 12/16/2021   Fall 12/01/2021   Left knee pain 01/25/2021   Constipation 06/06/2020   Leg swelling 11/18/2019   Pruritus 11/18/2019   Muscle pain 02/07/2019   Dysphagia 11/11/2018   Overweight (BMI 25.0-29.9) 11/05/2018   Cervical spondylosis with radiculopathy 06/20/2017   Hyperlipidemia    CAD (coronary artery disease)    Anxiety     Osteoarthritis 02/17/2017   Family history of coronary artery disease in father 02/17/2017   Lumbar spondylosis with myelopathy 07/18/2016   Laryngopharyngeal reflux (LPR) 06/24/2016   Type 2 diabetes mellitus with other specified complication (HCC) 01/13/2015   GERD (gastroesophageal reflux disease) 04/11/2014   At high risk for falls 12/31/2013   Postsurgical hypothyroidism 01/05/2013   Back pain with radiculopathy 11/28/2012   Vitamin D  deficiency 10/14/2010   Bilateral carotid bruits 10/13/2010   Allergic rhinitis 11/23/2007   Primary hypertension 04/14/2007   Coronary atherosclerosis 04/14/2007   Headache 04/14/2007   Thyroid  cancer (HCC) 03/23/1999    PCP: [name], MD  REFERRING PROVIDER: [name], MD  REFERRING DIAG: W19.CHERENE (ICD-10-CM) - Fall, initial encounter R29.6 (ICD-10-CM) - Recurrent falls R26.81 (ICD-10-CM) - Unsteady gait  THERAPY DIAG:  Impaired functional mobility, balance, gait, and endurance  Abnormal gait  Decreased strength of lower extremity  Rationale for Evaluation and Treatment: Rehabilitation  ONSET DATE: Fall occurred about 3 weeks ago, middle of May  SUBJECTIVE:   SUBJECTIVE STATEMENT: Pt states she is not in any pain but could because she is sedated from dentist and has ice patch on leg. Pt states she thinks she will be ready to be independent with care after a couple of more sessions.   Eval:  Pt states she was out looking for flowers at the store and was reaching for flowers and fell into [place]. Pt states no one was around and she fell again after trying to get up. Eventually got up on her own and went straight home. Pt states she has had about 5-6 falls, couple at home, one in [place] , one [place]. Pt states she feels a little nervous about falling. Pt states she has been staying in more often due to fear of falling, low back pain and hip pain.  PERTINENT HISTORY: L hip replacement, about 10  years ago Diabetes, medicated PAIN:  Are you having pain? No, occasional back pain, stooping over is problematic   PRECAUTIONS: Fall  RED FLAGS: Occasional stomach pain after taking certain medication   WEIGHT BEARING RESTRICTIONS: No  FALLS:  Has patient fallen in last 6 months? Yes. Number of falls 2  LIVING ENVIRONMENT: Lives with: lives with their spouse Lives in: House/apartment Stairs: Yes: Internal: 15 steps; on right going up and External: 2, 5 steps; on right going up Has following equipment at home: Single point cane and has not been using it recently  OCCUPATION: retired  PLOF: Independent  PATIENT GOALS: decreased fear of falling, increased activity tolerance, increased LE strength  NEXT MD VISIT: none reported  OBJECTIVE:  Note: Objective measures were completed at Evaluation unless otherwise noted.  DIAGNOSTIC FINDINGS: N/A  PATIENT SURVEYS:  ABC scale 1280/1600 10/19/23: 1340 / 1600 = 83.8 % COGNITION: Overall cognitive status: Within functional limits for tasks assessed     SENSATION: [facility]  LOWER EXTREMITY ROM:  Active ROM Right eval Left eval  Hip flexion    Hip extension    Hip abduction    Hip adduction    Hip internal rotation    Hip external rotation    Knee flexion    Knee extension    Ankle dorsiflexion    Ankle plantarflexion    Ankle inversion    Ankle eversion     (Blank rows = not tested)  LOWER EXTREMITY MMT:  MMT Right eval Left eval  Hip flexion 3+ 3+  Hip extension 3 3  Hip abduction 3+ 3+  Hip adduction 3+ 3+  Hip internal rotation    Hip external rotation    Knee flexion 4 4  Knee extension 4 4  Ankle dorsiflexion 3+ 3+  Ankle plantarflexion    Ankle inversion    Ankle eversion     (Blank rows = not tested)   FUNCTIONAL TESTS:  5 times sit to stand: 16.79 seconds Timed up and go (TUG): 11.11 seconds SLS 09/07/23: R: 15.69s L: 8.98s  09/21/23: 373ft with reports of increased LBP to 5/10 and  radicular symptoms down posterior Lt LE to calf    10/19/23: : 343ft no AD, no pain reported, SOB felt SLS 10/19/23: R: 13.96 L: 13.34 barefooted 5TSTS: 14 seconds, no increased pain  GAIT: Distance walked: 75 feet to and from treatment area Assistive device utilized: None Level of assistance: Complete Independence Comments: Pt demonstrates decreased gait speed and stride length bilaterally.                                                                                                                                 TREATMENT DATE:  10/26/2023  Therapeutic Exercise: -Treadmill, 3 minutes, grade 2.0, speed 1.4 -Step up and overs, 6 inch step, 3 pound ankle weights, 1 set of 7 reps bilaterally, pt cued for decreased UE support  Neuromuscular Re-education: -Standing marches and butt kicks, 1 bout each variation, 30 second bout, with 3 pound ankle weights -Lateral stepping with 3 pound ankle weights, 20 foot line -Lateral stepping over two 3 inch hurdles and one 9 inch hurdle with 3 pound ankle weights -Resisted walking/marching, 1 bout per variation with 3 pound ankle weights, 40 foot laps, with tidal tank at chest, pt cued for decreased looking down    10/19/2023  Progress Note: 5TSTS, , SLS Therapeutic Exercise: -Leg press, 1 set of 8 reps, plate 3, pt cued for eccentric control -Step up and overs, 8 inch step, 1 set of 8 reps bilaterally, pt cued for decreased UE support -SLS trials, 5 bouts each side - -Sit to stands, 2 sets of 10 reps, throughout session    10/11/23: Standing: Heel raises incline slope 15x  Toe raises decline slope 15x Tandem gait down black line 2RT Balance beam forward tandem then sidestep 2RT each Squats front of chair 10x Lunges onto 6in10x  PATIENT EDUCATION:  Education details: Pt was educated on findings of PT evaluation, prognosis, frequency of therapy visits and rationale, attendance policy, and HEP if given.   Person educated:  Patient Education method: Explanation, Verbal cues, and Handouts Education comprehension: verbalized understanding, verbal cues required, and needs further education  HOME EXERCISE PROGRAM: Access Code: [email or web address] URL: [email or web address] Date: 09/07/2023 Prepared by: Lang Ada  Exercises - Supine Bridge  - 1 x daily - 7 x weekly - 3 sets - 10 reps - Sit to Stand Without Arm Support  - 1 x daily - 7 x weekly - 3 sets - 10 reps - Standing Single Leg Stance with Counter Support  - 1 x daily - 7 x weekly - 1 sets - 3 reps - 30 seconds hold  10/04/23: - Side Stepping with Resistance at Thighs and Counter Support  - 2 x daily - 7 x weekly - 1 sets - 10 reps  ASSESSMENT:  CLINICAL IMPRESSION: Patient continues to demonstrate decreased LE strength, decreased gait quality and balance. Patient also demonstrates decreased endurance with treadmill exercise during today's session. Patient able to progress dynamic balance and LE strength resistance training today with ankle weight activities and tidal tank balance challenges, good performance with verbal cueing. Pt continues to demonstrates difficulty with step ambulation leading with left lower extremity, requires at least on UE support. Patient would continue to benefit from skilled physical therapy for increased endurance with ambulation, increased LE strength, and improved balance for improved quality of life, improved independence with gait training and continued progress towards therapy goals.   Eval:  Patient is a 78 y.o. female who was seen today for physical therapy evaluation and treatment for W19.CHERENE (ICD-10-CM) - Fall, initial encounter R29.6 (ICD-10-CM) - Recurrent falls R26.81 (ICD-10-CM) - Unsteady gait. Patient demonstrates decreased BLE strength, abnormal gait, and impaired balance. Patient also demonstrates difficulty with ambulation during today's session with decreased stride length and velocity noted. Patient also  demonstrates decreased SLS ability bilaterally left worse than right. Patient requires education on role of PT, HEP and importance of HEP compliance. Patient would benefit from skilled physical therapy for increased endurance with ambulation, increased LE strength, and balance for improved gait quality, return to higher level of function with ADLs, and progress towards therapy goals.   OBJECTIVE IMPAIRMENTS: Abnormal gait, decreased balance, decreased endurance, decreased strength, and pain.   ACTIVITY LIMITATIONS: carrying, lifting, standing, stairs, transfers, and locomotion level  PARTICIPATION LIMITATIONS: shopping, community activity, and yard work  PERSONAL FACTORS: Age, Past/current experiences, Time since onset of injury/illness/exacerbation, and 1 comorbidity: history of falls are also affecting patient's functional outcome.   REHAB POTENTIAL: Fair history of falling  CLINICAL DECISION MAKING: Stable/uncomplicated  EVALUATION COMPLEXITY: Low   GOALS: Goals reviewed with patient? No  SHORT TERM GOALS: Target date: 09/28/23  Pt will be independent with HEP in order to demonstrate participation in Physical Therapy POC.  Baseline: Goal status: MET  2.  Pt will improve ABC score by 15% in order to demonstrate improved pain with functional goals and outcomes. Baseline: see objective Goal status: IN PROGRESS  LONG TERM GOALS: Target date: 10/19/23  Pt will increase SLS time to at least 20 seconds on each LE in order to demonstrate improved balance with ADL/mobility.   Baseline: see objective.  Goal status: IN PROGRESS  2.  Pt will improve 2 MWT by 40 in order to demonstrate improved functional mobility capacity in community setting.  Baseline: see objective.  Goal status: IN PROGRESS  3.  Pt will improve ABC score by 30% from original score in order to demonstrate improved pain with functional goals and outcomes. Baseline: see objective.  Goal status: IN PROGRESS  4.   Pt will improve 5TSTS time by at least 2.3 seconds in order to demonstrate improved functional strength during functional transfers.  Baseline: see objective.  Goal status: MET    PLAN:  PT FREQUENCY: 1-2x/week  PT DURATION: 2 weeks  PLANNED INTERVENTIONS: 97110-Therapeutic exercises, 97530- Therapeutic activity, [id number]- Neuromuscular re-education, 97535- Self Care, [id number]- Manual therapy, [phone]- Gait training, Patient/Family education, Balance training, Stair training, and DME instructions  PLAN FOR NEXT SESSION: Progress balance and LE strengthening. Resume standing exerises if Rt knee pain is reduced. Possible discharge the 13th of August. Request extended dates from insurance to 11/04/23.   Vickey Ewbank, PT, DPT [facility] Office: [phone] 3:28 PM, 10/26/23

## 2023-11-01 ENCOUNTER — Ambulatory Visit (INDEPENDENT_AMBULATORY_CARE_PROVIDER_SITE_OTHER): Admitting: Psychiatry

## 2023-11-01 DIAGNOSIS — Z630 Problems in relationship with spouse or partner: Secondary | ICD-10-CM

## 2023-11-01 NOTE — Progress Notes (Signed)
 IN-PERSON  THERAPIST PROGRESS NOTE  Session Time:  Tuesday  11/01/2023 1:15 PM -  2:05 PM   Participation Level: Active  Behavioral Response: CasualAlertAnxious  Type of Therapy: Individual Therapy  Treatment Goals addressed: Verbalize an understanding and resolution of current interpersonal problems Learn and implement conflict resolution skills to resolve interpersonal problems   ProgressTowards Goals: Progressing  Interventions: Supportive/CBT  Summary: Jennifer Blackburn is a 78 y.o. female who is referred for services by PCP Dr. Rollene Pesa due to pt experiencing stress. She denies any psychiatric hospitalizations. She is a returning pt to this clinician and last was seen in 2016.  Patient is resuming services as she reports increased stress triggered by health issues, increased fatigue, and marital issues.  Patient's current symptoms include fatigue, worry, and muscle tension.  Patient last was seen about 5-6 weeks  ago.  She reports continued stress regarding marital issues.  She verbalizes feelings of hurt and disappointment as husband remains very critical, continues to make negative comments, and dismisses her feelings per her report.  She states she has been trying to not say things when they disagree as he just will continuing arguing. She shares more information today regarding past hurtful experiences with husband. She reports a pattern of not saying anything to her husband or she disagrees with something he did to avoid arguments.  She reports feeling as though she is on the back burner as he treats other people well and will do things for other people.    Suicidal/Homicidal: Nowithout intent/plan  Therapist Response: Reviewed symptoms, facilitated patient share more information regarding the relationship with her husband, assisted patient identify her pattern of interaction/communication with husband, assisted patient identified the effects of a pattern of yielding on her  thoughts/mood/behavior and the relationship with her husband, introduced basic personal rights, assisted patient identify statements to promote more effective assertion, developed plan with patient to read statements daily, will discuss more next session Plan: Return again in 2 weeks.  Diagnosis: Stress due to marital problems  Collaboration of Care: Primary Care Provider AEB patient sees PCP Dr. Rollene Pesa  Patient/Guardian was advised Release of Information must be obtained prior to any record release in order to collaborate their care with an outside provider. Patient/Guardian was advised if they have not already done so to contact the registration department to sign all necessary forms in order for us  to release information regarding their care.   Consent: Patient/Guardian gives verbal consent for treatment and assignment of benefits for services provided during this visit. Patient/Guardian expressed understanding and agreed to proceed.   Winton FORBES Rubinstein, LCSW 11/01/2023

## 2023-11-02 ENCOUNTER — Encounter: Payer: Self-pay | Admitting: Internal Medicine

## 2023-11-02 ENCOUNTER — Ambulatory Visit (INDEPENDENT_AMBULATORY_CARE_PROVIDER_SITE_OTHER): Admitting: Internal Medicine

## 2023-11-02 ENCOUNTER — Encounter (HOSPITAL_COMMUNITY)

## 2023-11-02 ENCOUNTER — Ambulatory Visit: Payer: Self-pay

## 2023-11-02 VITALS — BP 108/65 | HR 80 | Ht 65.0 in | Wt 179.4 lb

## 2023-11-02 DIAGNOSIS — R5382 Chronic fatigue, unspecified: Secondary | ICD-10-CM

## 2023-11-02 DIAGNOSIS — M791 Myalgia, unspecified site: Secondary | ICD-10-CM | POA: Diagnosis not present

## 2023-11-02 DIAGNOSIS — E890 Postprocedural hypothyroidism: Secondary | ICD-10-CM | POA: Diagnosis not present

## 2023-11-02 DIAGNOSIS — Z794 Long term (current) use of insulin: Secondary | ICD-10-CM | POA: Diagnosis not present

## 2023-11-02 DIAGNOSIS — E1169 Type 2 diabetes mellitus with other specified complication: Secondary | ICD-10-CM

## 2023-11-02 MED ORDER — CYANOCOBALAMIN 1000 MCG/ML IJ SOLN
1000.0000 ug | Freq: Once | INTRAMUSCULAR | Status: AC
  Administered 2023-11-02 (×2): 1000 ug via INTRAMUSCULAR

## 2023-11-02 NOTE — Assessment & Plan Note (Signed)
 Lab Results  Component Value Date   TSH 0.84 06/30/2023   Continue levothyroxine  112 mcg QD Followed by endocrinology

## 2023-11-02 NOTE — Assessment & Plan Note (Addendum)
 Lab Results  Component Value Date   HGBA1C 8.8 (A) 06/30/2023   Uncontrolled due to diet noncompliance Associated with HTN, CAD and HLD On Ozempic  2 mg QW, Toujeo  30-50 U at bedtime and metformin  1000 mg BID-followed by endocrinology Advised to follow diabetic diet On statin F/u CMP and lipid panel Diabetic eye exam: Advised to follow up with Ophthalmology for diabetic eye exam

## 2023-11-02 NOTE — Assessment & Plan Note (Signed)
 Likely multifactorial, related to hyperglycemia, neuropathy and/or nutritional deficiencies She needs to continue B12 supplement while taking metformin  - B12 injection today Check CMP, vitamin D  and CK Advised to maintain adequate hydration

## 2023-11-02 NOTE — Patient Instructions (Addendum)
 Please start taking Vitamin B12 500 mcg once daily.  Please get fasting blood tests done in the next 2 days.

## 2023-11-02 NOTE — Assessment & Plan Note (Signed)
 She attributes it to statin - considering her history of CAD and uncontrolled type II DM, would prefer to continue statin Check CK Advised to maintain adequate hydration

## 2023-11-02 NOTE — Telephone Encounter (Signed)
 FYI Only or Action Required?: Action required by provider: request for appointment.  Patient was last seen in primary care on 08/03/2023 by Jennifer Rollene BRAVO, MD.  Called Nurse Triage reporting Dizziness.  Symptoms began several days ago.  Interventions attempted: Nothing.  Symptoms are: gradually worsening.  Triage Disposition: See Physician Within 24 Hours  Patient/caregiver understands and will follow disposition?: YesCopied from CRM #[id number]. Topic: Clinical - Red Word Triage >> Nov 02, 2023  1:56 PM Jennifer Blackburn wrote: [facility] that prompted transfer to Nurse Triage: Light headed, faint, and off balance.. Reason for Disposition  [1] MODERATE dizziness (e.Blackburn., interferes with normal activities) AND [2] has NOT been evaluated by doctor (or NP/PA) for this  (Exception: Dizziness caused by heat exposure, sudden standing, or poor fluid intake.)  Answer Assessment - Initial Assessment Questions Pt is taking ozempic  and dosage was increased last month. Pt has been on abx for toothache but finished medication last week. My blood sugar has been up and down. Pt is unsure of cause.     1. DESCRIPTION: Describe your dizziness.     Like I'm giong to faint 2. LIGHTHEADED: Do you feel lightheaded? (e.Blackburn., somewhat faint, woozy, weak upon standing)     yes 3. VERTIGO: Do you feel like either you or the room is spinning or tilting? (i.e., vertigo)     na 4. SEVERITY: How bad is it?  Do you feel like you are going to faint? Can you stand and walk?     I need to slow down 5. ONSET:  When did the dizziness begin?     Days and days ago 6. AGGRAVATING FACTORS: Does anything make it worse? (e.Blackburn., standing, change in head position)     movement 7. HEART RATE: Can you tell me your heart rate? How many beats in 15 seconds?  (Note: Not all patients can do this.)       denies 8. CAUSE: What do you think is causing the dizziness? (e.Blackburn., decreased fluids or food, diarrhea,  emotional distress, heat exposure, new medicine, sudden standing, vomiting; unknown)     Not sure  10. OTHER SYMPTOMS: Do you have any other symptoms? (e.Blackburn., fever, chest pain, vomiting, diarrhea, bleeding)       Off balance at times  Protocols used: Dizziness - Lightheadedness-A-AH

## 2023-11-02 NOTE — Progress Notes (Signed)
 Acute Office Visit  Subjective:    Patient ID: Jennifer Blackburn, female    DOB: 27-Dec-1945, 78 y.o.   MRN: [id number]  Chief Complaint  Patient presents with   Fatigue    Feels fatigued, and weak, would like blood work to see where she is at from taking statins.    HPI Patient is in today for complaint of chronic fatigue and myalgias.  She also reports mild dizziness at times.  She attributes it to Ozempic  and statin.  She reports worsening of epigastric discomfort, nausea and changes in appetite since increasing dose of Ozempic  to 2 mg.  She is currently followed by endocrinology for her diabetes management and has an appointment in the next week.  She reports her fasting blood glucose levels in 200s lately despite taking her Ozempic , Toujeo  and metformin  regularly.  She also reports chronic gait disturbance.  She has been taking gabapentin  100 mg twice daily currently.  She has not been taking B12 supplements for the last few months.  Past Medical History:  Diagnosis Date   Anxiety    Arthritis    CAD (coronary artery disease)    Depression    Diabetes mellitus type II    without complication   DJD (degenerative joint disease) of lumbar spine    Hypercholesteremia    Hyperlipidemia    Hypertension    benign    Hypothyroidism    Thyroid  cancer (HCC) 2001    Past Surgical History:  Procedure Laterality Date   ABDOMINAL HYSTERECTOMY     CARDIAC CATHETERIZATION     CATARACT EXTRACTION W/PHACO Right 07/17/2012   Procedure: CATARACT EXTRACTION PHACO AND INTRAOCULAR LENS PLACEMENT (IOC);  Surgeon: Cherene Mania, MD;  Location: [facility];  Service: Ophthalmology;  Laterality: Right;  CDE:25.51   CATARACT EXTRACTION W/PHACO Left 09/17/2016   Procedure: CATARACT EXTRACTION PHACO AND INTRAOCULAR LENS PLACEMENT LEFT EYE;  Surgeon: Mania Cherene, MD;  Location: [facility];  Service: Ophthalmology;  Laterality: Left;  CDE: 19.23   COLONOSCOPY     COLONOSCOPY N/A 01/15/2014   Procedure:  COLONOSCOPY;  Surgeon: Lamar CHRISTELLA Hollingshead, MD;  Location: [facility];  Service: Endoscopy;  Laterality: N/A;  9:00 AM   COLONOSCOPY WITH PROPOFOL  N/A 01/25/2019   Procedure: COLONOSCOPY WITH PROPOFOL ;  Surgeon: Hollingshead Lamar CHRISTELLA, MD; diverticulosis in the entire colon, otherwise normal exam.  No repeat colonoscopy due to age.   DOPPLER ECHOCARDIOGRAPHY     ESOPHAGOGASTRODUODENOSCOPY (EGD) WITH PROPOFOL  N/A 01/25/2019   Procedure: ESOPHAGOGASTRODUODENOSCOPY (EGD) WITH PROPOFOL ;  Surgeon: Hollingshead Lamar CHRISTELLA, MD; normal esophagus without dilation due to inability to pass dilator beyond the hypopharynx, 1 small nonbleeding gastric ulcer s/p biopsied, otherwise normal exam.  Pathology with ulcer with reactive changes, no H. pylori, metaplasia, dysplasia, or malignancy.   ESOPHAGOGASTRODUODENOSCOPY (EGD) WITH PROPOFOL  N/A 08/21/2019   Procedure: ESOPHAGOGASTRODUODENOSCOPY (EGD) WITH PROPOFOL ;  Surgeon: Hollingshead Lamar CHRISTELLA, MD; Normal esophagus. Previously noted gastric ulcer completely healed. Normal examined duodenum.    JOINT REPLACEMENT  07/01/2010   left hip   KNEE SURGERY Right    arthroscopy   left hip replaced  07/01/2010   SPINE SURGERY  2006   cervical   stress dipyridamole myocardial perfusion     THYROIDECTOMY     TONSILLECTOMY     VESICOVAGINAL FISTULA CLOSURE W/ TAH      Family History  Problem Relation Age of Onset   Heart attack Father    Heart failure Mother    Asthma Daughter  Sleep apnea Son        CPAP   Colon cancer Neg Hx     Social History   Socioeconomic History   Marital status: Married    Spouse name: Not on file   Number of children: 2   Years of education: 14   Highest education level: Some college, no degree  Occupational History   Occupation: retired     Comment: bank  Tobacco Use   Smoking status: Never    Passive exposure: Yes   Smokeless tobacco: Never   Tobacco comments:    Husband smokes in the home  Vaping Use   Vaping status: Never Used  Substance and  Sexual Activity   Alcohol use: No   Drug use: No   Sexual activity: Not Currently  Other Topics Concern   Not on file  Social History Narrative   Lives with husband, at home   No caffeine   Social Drivers of Corporate investment banker Strain: Low Risk  (09/14/2023)   Overall Financial Resource Strain (CARDIA)    Difficulty of Paying Living Expenses: Not hard at all  Food Insecurity: No Food Insecurity (09/14/2023)   Hunger Vital Sign    Worried About Running Out of Food in the Last Year: Never true    Ran Out of Food in the Last Year: Never true  Transportation Needs: No Transportation Needs (09/14/2023)   PRAPARE - Administrator, Civil Service (Medical): No    Lack of Transportation (Non-Medical): No  Physical Activity: Insufficiently Active (09/14/2023)   Exercise Vital Sign    Days of Exercise per Week: 4 days    Minutes of Exercise per Session: 30 min  Stress: No Stress Concern Present (09/14/2023)   [employer] of Occupational Health - Occupational Stress Questionnaire    Feeling of Stress: Not at all  Social Connections: Socially Integrated (09/14/2023)   Social Connection and Isolation Panel    Frequency of Communication with Friends and Family: More than three times a week    Frequency of Social Gatherings with Friends and Family: Twice a week    Attends Religious Services: More than 4 times per year    Active Member of [employer] or Organizations: Yes    Attends Engineer, structural: More than 4 times per year    Marital Status: Married  Catering manager Violence: Not At Risk (09/14/2023)   Humiliation, Afraid, Rape, and Kick questionnaire    Fear of Current or Ex-Partner: No    Emotionally Abused: No    Physically Abused: No    Sexually Abused: No    Outpatient Medications Prior to Visit  Medication Sig Dispense Refill   ACCU-CHEK GUIDE test strip USE AS INSTRUCTED TO CHECK BLOOD SUGAR 2 TIMES PER DAY DX CODE E11.65 100 strip 3   Cetirizine   HCl 10 MG TBDP Take one tablet by mouth two times daily for allergy 60 tablet 5   Cholecalciferol (VITAMIN D3) 50 MCG (2000 UT) TABS Take 2,000 Units by mouth daily.     ezetimibe  (ZETIA ) 10 MG tablet TAKE 1 TABLET BY MOUTH EVERY DAY 90 tablet 1   gabapentin  (NEURONTIN ) 100 MG capsule TAKE ONE CAPSULE TWICE DAILY AND THREE AT BEDTIME 150 capsule 1   hydrOXYzine  (ATARAX ) 10 MG tablet Take one tablet twice daily and two tablets at bedtime as needed for generalized itching. 360 tablet 1   imipramine  (TOFRANIL ) 50 MG tablet Take 1 tablet (50 mg total) by mouth at  bedtime. 90 tablet 3   insulin  aspart (NOVOLOG  FLEXPEN) 100 UNIT/ML FlexPen Inject 18 Units into the skin 3 (three) times daily with meals.     insulin  glargine, 1 Unit Dial , (TOUJEO  SOLOSTAR) 300 UNIT/ML Solostar Pen INJECT 30-50 UNITS TWO TIMES DAILY 9 mL 1   Insulin  Pen Needle 32G X 4 MM MISC Use 4x a day 300 each 3   isosorbide  mononitrate (IMDUR ) 30 MG 24 hr tablet TAKE 1/2 TABLET BY MOUTH DAILY 45 tablet 3   Lancets (ACCU-CHEK MULTICLIX) lancets Use as instructed three times daily dx 250.01 100 each 5   levothyroxine  (SYNTHROID ) 112 MCG tablet TAKE 1 TABLET BY MOUTH EVERY DAY BEFORE BREAKFAST 90 tablet 3   metFORMIN  (GLUCOPHAGE ) 1000 MG tablet Take 1 tablet (1,000 mg total) by mouth 2 (two) times daily. 180 tablet 1   metoprolol  tartrate (LOPRESSOR ) 50 MG tablet TAKE 1 TABLET BY MOUTH TWICE A DAY 180 tablet 3   pantoprazole  (PROTONIX ) 40 MG tablet TAKE 1 TABLET BY MOUTH EVERY DAY 90 tablet 1   PARoxetine  (PAXIL ) 20 MG tablet TAKE 1 TABLET EVERY DAY 90 tablet 3   polyethylene glycol powder (GLYCOLAX /MIRALAX ) 17 GM/SCOOP powder Take 1 Container by mouth daily as needed.     pravastatin  (PRAVACHOL ) 80 MG tablet TAKE 1 TABLET EVERY DAY 90 tablet 3   Semaglutide , 2 MG/DOSE, 8 MG/3ML SOPN Inject 2 mg as directed once a week. 9 mL 2   No facility-administered medications prior to visit.    Allergies  Allergen Reactions   Lipitor  [Atorvastatin  Calcium ] Other (See Comments)    Muscle aches   Actos [Pioglitazone] Other (See Comments)    Peripheral edema   Crestor  [Rosuvastatin ] Other (See Comments)    Muscle aches   Daypro [Oxaprozin] Hives   Nsaids Hives   Sulfonamide Derivatives Hives    Review of Systems  Constitutional:  Positive for fatigue. Negative for chills and fever.  HENT:  Negative for congestion, sinus pressure, sinus pain and sore throat.   Eyes:  Negative for pain and discharge.  Respiratory:  Negative for cough and shortness of breath.   Cardiovascular:  Negative for chest pain and palpitations.  Gastrointestinal:  Positive for abdominal pain and nausea. Negative for diarrhea and vomiting.  Endocrine: Negative for polydipsia and polyuria.  Genitourinary:  Negative for dysuria and hematuria.  Musculoskeletal:  Negative for neck pain and neck stiffness.  Skin:  Negative for rash.  Neurological:  Positive for dizziness and weakness.  Psychiatric/Behavioral:  Negative for agitation and behavioral problems.        Objective:    Physical Exam Vitals reviewed.  Constitutional:      General: She is not in acute distress.    Appearance: She is not diaphoretic.  HENT:     Head: Normocephalic and atraumatic.     Nose: Nose normal.     Mouth/Throat:     Mouth: Mucous membranes are moist.  Eyes:     General: No scleral icterus.    Extraocular Movements: Extraocular movements intact.  Cardiovascular:     Rate and Rhythm: Normal rate and regular rhythm.     Heart sounds: Normal heart sounds. No murmur heard. Pulmonary:     Breath sounds: Normal breath sounds. No wheezing or rales.  Abdominal:     Palpations: Abdomen is soft.     Tenderness: There is no abdominal tenderness.  Musculoskeletal:     Cervical back: Neck supple. No tenderness.     Right lower leg:  No edema.     Left lower leg: No edema.  Skin:    General: Skin is warm.     Findings: No rash.  Neurological:     General: No  focal deficit present.     Mental Status: She is alert and oriented to person, place, and time.     Sensory: No sensory deficit.     Motor: Weakness (B/l LE - 4/5) present.  Psychiatric:        Mood and Affect: Mood normal.        Behavior: Behavior normal.     BP 108/65   Pulse 80   Ht 5' 5 (1.651 m)   Wt 179 lb 6.4 oz (81.4 kg)   LMP 11/11/2016   SpO2 98%   BMI 29.85 kg/m  Wt Readings from Last 3 Encounters:  11/02/23 179 lb 6.4 oz (81.4 kg)  09/14/23 175 lb (79.4 kg)  08/03/23 178 lb (80.7 kg)        Assessment & Plan:   Problem List Items Addressed This Visit       Endocrine   Postsurgical hypothyroidism   Lab Results  Component Value Date   TSH 0.84 06/30/2023   Continue levothyroxine  112 mcg QD Followed by endocrinology      Type 2 diabetes mellitus with other specified complication (HCC)   Lab Results  Component Value Date   HGBA1C 8.8 (A) 06/30/2023   Uncontrolled due to diet noncompliance Associated with HTN, CAD and HLD On Ozempic  2 mg QW, Toujeo  30-50 U at bedtime and metformin  1000 mg BID-followed by endocrinology Advised to follow diabetic diet On statin F/u CMP and lipid panel Diabetic eye exam: Advised to follow up with Ophthalmology for diabetic eye exam         Other   Myalgia   She attributes it to statin - considering her history of CAD and uncontrolled type II DM, would prefer to continue statin Check CK Advised to maintain adequate hydration       Relevant Orders   CK (Creatine Kinase)   Chronic fatigue - Primary   Likely multifactorial, related to hyperglycemia, neuropathy and/or nutritional deficiencies She needs to continue B12 supplement while taking metformin  - B12 injection today Check CMP, vitamin D  and CK Advised to maintain adequate hydration        Meds ordered this encounter  Medications   cyanocobalamin  (VITAMIN B12) injection 1,000 mcg     Sparkles Mcneely K Deloy Archey, MD

## 2023-11-07 DIAGNOSIS — E782 Mixed hyperlipidemia: Secondary | ICD-10-CM | POA: Diagnosis not present

## 2023-11-07 DIAGNOSIS — M791 Myalgia, unspecified site: Secondary | ICD-10-CM | POA: Diagnosis not present

## 2023-11-07 DIAGNOSIS — I10 Essential (primary) hypertension: Secondary | ICD-10-CM | POA: Diagnosis not present

## 2023-11-07 DIAGNOSIS — E559 Vitamin D deficiency, unspecified: Secondary | ICD-10-CM | POA: Diagnosis not present

## 2023-11-08 ENCOUNTER — Ambulatory Visit: Payer: Self-pay | Admitting: Internal Medicine

## 2023-11-08 ENCOUNTER — Ambulatory Visit: Payer: Self-pay | Admitting: Family Medicine

## 2023-11-08 LAB — LIPID PANEL
Chol/HDL Ratio: 3.2 ratio (ref 0.0–4.4)
Cholesterol, Total: 125 mg/dL (ref 100–199)
HDL: 39 mg/dL — ABNORMAL LOW (ref >39)
LDL Chol Calc (NIH): 63 mg/dL (ref 0–99)
Triglycerides: 130 mg/dL (ref 0–149)
VLDL Cholesterol Cal: 23 mg/dL (ref 5–40)

## 2023-11-08 LAB — CMP14+EGFR
ALT: 20 IU/L (ref 0–32)
AST: 27 IU/L (ref 0–40)
Albumin: 4.2 g/dL (ref 3.8–4.8)
Alkaline Phosphatase: 53 IU/L (ref 44–121)
BUN/Creatinine Ratio: 14 (ref 12–28)
BUN: 13 mg/dL (ref 8–27)
Bilirubin Total: 0.2 mg/dL (ref 0.0–1.2)
CO2: 22 mmol/L (ref 20–29)
Calcium: 9.3 mg/dL (ref 8.7–10.3)
Chloride: 98 mmol/L (ref 96–106)
Creatinine, Ser: 0.92 mg/dL (ref 0.57–1.00)
Globulin, Total: 3.3 g/dL (ref 1.5–4.5)
Glucose: 218 mg/dL — ABNORMAL HIGH (ref 70–99)
Potassium: 4.9 mmol/L (ref 3.5–5.2)
Sodium: 138 mmol/L (ref 134–144)
Total Protein: 7.5 g/dL (ref 6.0–8.5)
eGFR: 64 mL/min/1.73 (ref >59)

## 2023-11-08 LAB — CK: Total CK: 73 U/L (ref 32–182)

## 2023-11-08 LAB — VITAMIN D 25 HYDROXY (VIT D DEFICIENCY, FRACTURES): Vit D, 25-Hydroxy: 46.5 ng/mL (ref 30.0–100.0)

## 2023-11-09 ENCOUNTER — Ambulatory Visit: Admitting: Internal Medicine

## 2023-11-09 ENCOUNTER — Encounter: Payer: Self-pay | Admitting: Internal Medicine

## 2023-11-09 VITALS — BP 122/80 | HR 81 | Ht 65.0 in | Wt 178.0 lb

## 2023-11-09 DIAGNOSIS — E890 Postprocedural hypothyroidism: Secondary | ICD-10-CM | POA: Diagnosis not present

## 2023-11-09 DIAGNOSIS — Z7985 Long-term (current) use of injectable non-insulin antidiabetic drugs: Secondary | ICD-10-CM

## 2023-11-09 DIAGNOSIS — C73 Malignant neoplasm of thyroid gland: Secondary | ICD-10-CM | POA: Diagnosis not present

## 2023-11-09 DIAGNOSIS — Z794 Long term (current) use of insulin: Secondary | ICD-10-CM

## 2023-11-09 DIAGNOSIS — E1159 Type 2 diabetes mellitus with other circulatory complications: Secondary | ICD-10-CM

## 2023-11-09 DIAGNOSIS — Z7984 Long term (current) use of oral hypoglycemic drugs: Secondary | ICD-10-CM | POA: Diagnosis not present

## 2023-11-09 MED ORDER — METFORMIN HCL 1000 MG PO TABS: 1000.0000 mg | ORAL_TABLET | Freq: Two times a day (BID) | ORAL | 3 refills | Status: AC

## 2023-11-09 MED ORDER — TOUJEO SOLOSTAR 300 UNIT/ML ~~LOC~~ SOPN: PEN_INJECTOR | SUBCUTANEOUS | Status: DC

## 2023-11-09 NOTE — Patient Instructions (Addendum)
 Please continue: - Metformin  1000 mg 2x a day with meals. - Ozempic  1-2 mg weekly  Move: - Toujeo  60 units in am - Novolog  20-25 units 15 min before each meal    Try to start exercise.   Please continue levothyroxine  112 mcg daily.  Take the thyroid  hormone every day, with water , at least 30 minutes before breakfast, separated by at least 4 hours from: - acid reflux medications - calcium  - iron - multivitamins  Please return in 3 months.

## 2023-11-09 NOTE — Progress Notes (Signed)
 Patient ID: Jennifer Blackburn, female   DOB: [birth year], 78 y.o.   MRN: [id number]   HPI: Jennifer Blackburn is a 78 y.o.-year-old female, returning for follow-up for DM2, dx in 2001, insulin -dependent since 2017, uncontrolled, with long-term complications (DR, CAD) and also papillary thyroid  cancer and postsurgical hypothyroidism.  Last visit 4 months ago.  Interim history: No increased urination, blurry vision, chest pain. She mentions that she is still having many dietary indiscretions.  DM2: Reviewed HbA1c levels: Lab Results  Component Value Date   HGBA1C 8.8 (A) 06/30/2023   HGBA1C 9.5 (A) 03/03/2023   HGBA1C 11.2 10/11/2022   HGBA1C 9.4 (A) 06/08/2022   HGBA1C 9.1 (A) 12/31/2021   HGBA1C 8.8 (A) 08/27/2021   HGBA1C 8.8 (A) 04/28/2021   HGBA1C 10.1 (A) 12/23/2020   HGBA1C 9.0 (A) 08/07/2020   HGBA1C 10.5 (A) 05/09/2020  01/12/2018: HbA1c 8.7% 08/05/2017: HbA1c 8.9% 07/11/2017: HbA1c 9.1%  Pt is on a regimen of: - Metformin  1000 mg 2x a day with meals. - Toujeo  30 units in a.m. and 50 units at bedtime >> 30 units in a.m. and 40 units at bedtime (missing the dose at night) >> >> 30 units 2x a day - NovoLog  10 -15 units before/after each meal >> not consistently >> 20-25 >> 18-20 units before meals - Ozempic  0.5 ...>> 2 >> 1-2 mg weekly (through PAP) She was on Ozempic  0.5 mg weekly in a.m. >> 1 mg  - constipation >> stopped 06/2018.  We added it back 09/2018. She was on Victoza >> $$$. She was on Jardiance >> $$$. She was on Actos >> swelling - stopped Spring 2019. She was on Glipizide  XL 5 mg daily >> stopped 07/2017.  We retried glipizide  5 mg twice a day but had to stop 12/2018 due to lack of effect.  Pt checks her sugars >4x times a day:  Previously:  Prev.: - am:189-210 - 2h after b'fast:  - lunch: 200s - 2h after lunch: n/c - dinner: n/c - 2h after dinner: 190-201 - bedtime: n/c  Lowest sugar was 90s >> 110 >> 58 >> 100; she has hypoglycemia awareness in the  60s. Highest sugar was 200s >> 200s >> 300s >> 300s.  Glucometer: AccuChek Aviva  Pt's meals are: - Breakfast:toast, cereal, milk; oatmeal; bacon + egg >> oatmeal + coffee >> cereals - Lunch: skips >> 2 eggs, fruit - Dinner: hamburger, or chicken, or fish, + veggies, bread, water  and tea - Snacks: 2- fruit She was seeing a dietitian in [place].  -No CKD, last BUN/creatinine:  Lab Results  Component Value Date   BUN 13 11/07/2023   BUN 12 05/04/2023   CREATININE 0.92 11/07/2023   CREATININE 0.85 05/04/2023   Lab Results  Component Value Date   MICRALBCREAT 12 06/30/2023   MICRALBCREAT 11 06/11/2022   MICRALBCREAT 12 04/21/2021   MICRALBCREAT 13 01/28/2021   MICRALBCREAT 11.5 06/14/2017   MICRALBCREAT 20.8 07/10/2014   MICRALBCREAT 10.4 05/02/2013   MICRALBCREAT 5.3 04/06/2012   MICRALBCREAT 3.7 12/21/2010   MICRALBCREAT 29.8 12/13/2009  On quinapril .  -+ HL; last set of lipids: Lab Results  Component Value Date   CHOL 125 11/07/2023   HDL 39 (L) 11/07/2023   LDLCALC 63 11/07/2023   TRIG 130 11/07/2023   CHOLHDL 3.2 11/07/2023  On pravastatin  80.  - last eye exam was 09/2023: + DR reportedly. Dr. Franchot in [place].  She has cataracts OU.   -No numbness and tingling in her feet. Has a podiatrist  in [place].  Last foot exam 10/11/2022.  On ASA 81.  Pt has no FH of DM.  H/o Thyroid  cancer (2001), s/p RAI treatment, now with postsurgical hypothyroidism.  Previously on brand-name Synthroid , now generic levothyroxine .  Pt is on levothyroxine  112 mcg daily (dose decreased 02/2023), taken: - in am - fasting - at least 30 min from b'fast - no calcium  - no iron - no multivitamins - + PPIs (Protonix ) moved to pm - not on Biotin  Reviewed her TFTs: Lab Results  Component Value Date   TSH 0.84 06/30/2023   TSH 0.10 (L) 03/03/2023   TSH 0.63 10/11/2022   TSH 0.72 12/31/2021   TSH 0.06 (L) 08/27/2021   TSH 0.40 08/07/2020   TSH 1.60 05/09/2020   TSH  0.05 (L) 08/31/2019   TSH 0.04 (L) 12/28/2018   TSH 0.06 (L) 09/21/2018   TSH 0.61 05/26/2018   TSH 2.49 03/23/2016   TSH 0.070 (L) 10/13/2010   TSH 0.035 (L) 09/22/2006   TSH 0.015 (L) 06/17/2006  11/03/2017: TSH 0.044  Her thyroglobulin levels are detectable: Lab Results  Component Value Date   THYROGLB 0.3 (L) 03/03/2023   THYROGLB 0.5 (L) 10/11/2022   THYROGLB 0.3 (L) 08/27/2021   THYROGLB 0.4 (L) 08/07/2020   THYROGLB 1.2 (L) 05/09/2020   THYROGLB 0.4 (L) 08/31/2019   THYROGLB 0.3 (L) 09/21/2018   THYROGLB 0.7 (L) 05/26/2018   Lab Results  Component Value Date   THGAB <1 03/03/2023   THGAB <1 10/11/2022   THGAB <1 08/27/2021   THGAB <1 08/07/2020   THGAB <1 05/09/2020   THGAB <1 08/31/2019   THGAB <1 09/21/2018   THGAB <1 05/26/2018   Reviewed records from Dr. Christi: Patient is status post total thyroidectomy in 2004 1.3 cm papillary thyroid  cancer of the right lobe, followed by 3x RAI tx's with  33 mCi I-131 and a fourth RAI dose inpatient: 125 mCi, at [facility].    Whole-body scan on 09/08/2007 showed an increase in the thyroid  bed activity and the follow up study with PET scan 10/08/2007 showed mildly hypermetabolic cervical lymph nodes.    Repeat PET 05/10/2008 showed no significant changes felt likely to be due to to a reactive process.  11/03/2017: TSH 0.044, thyroglobulin 0.2, ATA <1.0  04/05/2017: TSH 0.021 (0.45-4.5), thyroglobulin 0.3 (by IMA), ATA <1.0  Reviewed imaging test reports in the system: Thyroid  ultrasound (06/10/2018): 1.1 cm nonspecific calcified lesion in the right upper neck.  CT was recommended: There is no residual or recurrent tissue in the right or left thyroid  beds. There is no evidence of abnormal adenopathy by short axis diameter measurement criteria There is a nonspecific calcified soft tissue mass measuring 1.1 cm in the right superior neck. This is of unknown significance. CT neck can be performed to further characterize.  CT  (10/18/2018): Status post thyroidectomy. No soft tissue mass within the thyroidectomy bed. A 13 x 7 mm ovoid focus of calcification in the right aspect of the thyroidectomy bed likely corresponding with the finding on recent neck ultrasound and is unchanged as compared to neck CT 10/24/2007, benign.   No pathologically enlarged cervical chain lymph nodes. A nonenlarged calcified right supraclavicular lymph node is new from prior neck CT 10/24/2007 but also favored benign. Attention recommended on follow-up.  Neck U/S (06/03/2020): Ultrasound of the neck demonstrates no evidence of visible residual thyroid  tissue or abnormal soft tissue mass.   Ovoid area of densely shadowing calcification again noted to the right of midline  as seen by prior ultrasound and also demonstrated by prior CT studies of the neck with documented stability between 2009 and 2020 studies.   Small left cervical lymph node has a similar appearance to the prior ultrasound study measuring 0.5 cm in short axis.   IMPRESSION: No evidence by ultrasound of recurrent thyroid  malignancy in the neck.  Stable right neck calcification which has been documented to be benign and stable by prior CT.   Small left cervical lymph node demonstrates stable appearance and size by ultrasound  compared to the 2020 ultrasound.  Pt denies: - feeling nodules in neck - hoarseness - dysphagia - choking  She also has a history of HTN and anemia. She had an episode of gastroenteritis 05/28/2022. scan: diverticulitis, GB stones and kidney stones.  Lipase was normal.  ROS: + See HPI  I reviewed pt's medications, allergies, PMH, social hx, family hx, and changes were documented in the history of present illness. Otherwise, unchanged from my initial visit note.  Past Medical History:  Diagnosis Date   Anxiety    Arthritis    CAD (coronary artery disease)    Depression    Diabetes mellitus type II    without complication   DJD  (degenerative joint disease) of lumbar spine    Hypercholesteremia    Hyperlipidemia    Hypertension    benign    Hypothyroidism    Thyroid  cancer (HCC) 2001   Past Surgical History:  Procedure Laterality Date   ABDOMINAL HYSTERECTOMY     CARDIAC CATHETERIZATION     CATARACT EXTRACTION W/PHACO Right 07/17/2012   Procedure: CATARACT EXTRACTION PHACO AND INTRAOCULAR LENS PLACEMENT (IOC);  Surgeon: Cherene Mania, MD;  Location: [facility];  Service: Ophthalmology;  Laterality: Right;  CDE:25.51   CATARACT EXTRACTION W/PHACO Left 09/17/2016   Procedure: CATARACT EXTRACTION PHACO AND INTRAOCULAR LENS PLACEMENT LEFT EYE;  Surgeon: Mania Cherene, MD;  Location: [facility];  Service: Ophthalmology;  Laterality: Left;  CDE: 19.23   COLONOSCOPY     COLONOSCOPY N/A 01/15/2014   Procedure: COLONOSCOPY;  Surgeon: Lamar CHRISTELLA Hollingshead, MD;  Location: [facility];  Service: Endoscopy;  Laterality: N/A;  9:00 AM   COLONOSCOPY WITH PROPOFOL  N/A 01/25/2019   Procedure: COLONOSCOPY WITH PROPOFOL ;  Surgeon: Hollingshead Lamar CHRISTELLA, MD; diverticulosis in the entire colon, otherwise normal exam.  No repeat colonoscopy due to age.   DOPPLER ECHOCARDIOGRAPHY     ESOPHAGOGASTRODUODENOSCOPY (EGD) WITH PROPOFOL  N/A 01/25/2019   Procedure: ESOPHAGOGASTRODUODENOSCOPY (EGD) WITH PROPOFOL ;  Surgeon: Hollingshead Lamar CHRISTELLA, MD; normal esophagus without dilation due to inability to pass dilator beyond the hypopharynx, 1 small nonbleeding gastric ulcer s/p biopsied, otherwise normal exam.  Pathology with ulcer with reactive changes, no H. pylori, metaplasia, dysplasia, or malignancy.   ESOPHAGOGASTRODUODENOSCOPY (EGD) WITH PROPOFOL  N/A 08/21/2019   Procedure: ESOPHAGOGASTRODUODENOSCOPY (EGD) WITH PROPOFOL ;  Surgeon: Hollingshead Lamar CHRISTELLA, MD; Normal esophagus. Previously noted gastric ulcer completely healed. Normal examined duodenum.    JOINT REPLACEMENT  07/01/2010   left hip   KNEE SURGERY Right    arthroscopy   left hip replaced  07/01/2010   SPINE SURGERY   2006   cervical   stress dipyridamole myocardial perfusion     THYROIDECTOMY     TONSILLECTOMY     VESICOVAGINAL FISTULA CLOSURE W/ TAH     Social History   Socioeconomic History   Marital status: Married    Spouse name: Not on file   Number of children: 2   Years of education: 1  Highest education level: Some college, no degree  Occupational History   Occupation: retired     Comment: bank  Tobacco Use   Smoking status: Never    Passive exposure: Yes   Smokeless tobacco: Never   Tobacco comments:    Husband smokes in the home  Vaping Use   Vaping status: Never Used  Substance and Sexual Activity   Alcohol use: No   Drug use: No   Sexual activity: Not Currently  Other Topics Concern   Not on file  Social History Narrative   Lives with husband, at home   No caffeine   Social Drivers of Corporate investment banker Strain: Low Risk  (09/14/2023)   Overall Financial Resource Strain (CARDIA)    Difficulty of Paying Living Expenses: Not hard at all  Food Insecurity: No Food Insecurity (09/14/2023)   Hunger Vital Sign    Worried About Running Out of Food in the Last Year: Never true    Ran Out of Food in the Last Year: Never true  Transportation Needs: No Transportation Needs (09/14/2023)   PRAPARE - Administrator, Civil Service (Medical): No    Lack of Transportation (Non-Medical): No  Physical Activity: Insufficiently Active (09/14/2023)   Exercise Vital Sign    Days of Exercise per Week: 4 days    Minutes of Exercise per Session: 30 min  Stress: No Stress Concern Present (09/14/2023)   [employer] of Occupational Health - Occupational Stress Questionnaire    Feeling of Stress: Not at all  Social Connections: Socially Integrated (09/14/2023)   Social Connection and Isolation Panel    Frequency of Communication with Friends and Family: More than three times a week    Frequency of Social Gatherings with Friends and Family: Twice a week    Attends  Religious Services: More than 4 times per year    Active Member of [employer] or Organizations: Yes    Attends Engineer, structural: More than 4 times per year    Marital Status: Married  Catering manager Violence: Not At Risk (09/14/2023)   Humiliation, Afraid, Rape, and Kick questionnaire    Fear of Current or Ex-Partner: No    Emotionally Abused: No    Physically Abused: No    Sexually Abused: No   Current Outpatient Medications  Medication Sig Dispense Refill   ACCU-CHEK GUIDE test strip USE AS INSTRUCTED TO CHECK BLOOD SUGAR 2 TIMES PER DAY DX CODE E11.65 100 strip 3   Cetirizine  HCl 10 MG TBDP Take one tablet by mouth two times daily for allergy 60 tablet 5   Cholecalciferol (VITAMIN D3) 50 MCG (2000 UT) TABS Take 2,000 Units by mouth daily.     ezetimibe  (ZETIA ) 10 MG tablet TAKE 1 TABLET BY MOUTH EVERY DAY 90 tablet 1   gabapentin  (NEURONTIN ) 100 MG capsule TAKE ONE CAPSULE TWICE DAILY AND THREE AT BEDTIME 150 capsule 1   hydrOXYzine  (ATARAX ) 10 MG tablet Take one tablet twice daily and two tablets at bedtime as needed for generalized itching. 360 tablet 1   imipramine  (TOFRANIL ) 50 MG tablet Take 1 tablet (50 mg total) by mouth at bedtime. 90 tablet 3   insulin  aspart (NOVOLOG  FLEXPEN) 100 UNIT/ML FlexPen Inject 18 Units into the skin 3 (three) times daily with meals.     insulin  glargine, 1 Unit Dial , (TOUJEO  SOLOSTAR) 300 UNIT/ML Solostar Pen INJECT 30-50 UNITS TWO TIMES DAILY 9 mL 1   Insulin  Pen Needle 32G X 4 MM  MISC Use 4x a day 300 each 3   isosorbide  mononitrate (IMDUR ) 30 MG 24 hr tablet TAKE 1/2 TABLET BY MOUTH DAILY 45 tablet 3   Lancets (ACCU-CHEK MULTICLIX) lancets Use as instructed three times daily dx 250.01 100 each 5   levothyroxine  (SYNTHROID ) 112 MCG tablet TAKE 1 TABLET BY MOUTH EVERY DAY BEFORE BREAKFAST 90 tablet 3   metFORMIN  (GLUCOPHAGE ) 1000 MG tablet Take 1 tablet (1,000 mg total) by mouth 2 (two) times daily. 180 tablet 1   metoprolol  tartrate  (LOPRESSOR ) 50 MG tablet TAKE 1 TABLET BY MOUTH TWICE A DAY 180 tablet 3   pantoprazole  (PROTONIX ) 40 MG tablet TAKE 1 TABLET BY MOUTH EVERY DAY 90 tablet 1   PARoxetine  (PAXIL ) 20 MG tablet TAKE 1 TABLET EVERY DAY 90 tablet 3   polyethylene glycol powder (GLYCOLAX /MIRALAX ) 17 GM/SCOOP powder Take 1 Container by mouth daily as needed.     pravastatin  (PRAVACHOL ) 80 MG tablet TAKE 1 TABLET EVERY DAY 90 tablet 3   Semaglutide , 2 MG/DOSE, 8 MG/3ML SOPN Inject 2 mg as directed once a week. 9 mL 2   No current facility-administered medications for this visit.     Allergies  Allergen Reactions   Lipitor [Atorvastatin  Calcium ] Other (See Comments)    Muscle aches   Actos [Pioglitazone] Other (See Comments)    Peripheral edema   Crestor  [Rosuvastatin ] Other (See Comments)    Muscle aches   Daypro [Oxaprozin] Hives   Nsaids Hives   Sulfonamide Derivatives Hives   Family History  Problem Relation Age of Onset   Heart attack Father    Heart failure Mother    Asthma Daughter    Sleep apnea Son        CPAP   Colon cancer Neg Hx    PE: BP 122/80 (BP Location: Left Arm, Patient Position: Sitting, Cuff Size: Normal)   Pulse 81   Ht 5' 5 (1.651 m)   Wt 178 lb (80.7 kg)   LMP 11/11/2016   SpO2 96%   BMI 29.62 kg/m  Wt Readings from Last 3 Encounters:  11/09/23 178 lb (80.7 kg)  11/02/23 179 lb 6.4 oz (81.4 kg)  09/14/23 175 lb (79.4 kg)   Constitutional: overweight, in NAD Eyes: no exophthalmos ENT: no neck masses palpated, no cervical lymphadenopathy Cardiovascular: RRR, No MRG Respiratory: CTA B Musculoskeletal: no deformities Skin:  no rashes Neurological: no tremor with outstretched hands Diabetic Foot Exam - Simple   Simple Foot Form Diabetic Foot exam was performed with the following findings: Yes 11/09/2023 11:55 AM  Visual Inspection No deformities, no ulcerations, no other skin breakdown bilaterally: Yes Sensation Testing Intact to touch and monofilament testing  bilaterally: Yes Pulse Check Posterior Tibialis and Dorsalis pulse intact bilaterally: Yes Comments    ASSESSMENT: 1. DM2, insulin -dependent, uncontrolled, with long-term complications - CAD - DR  2.  Papillary thyroid  cancer  3.  Postsurgical hypothyroidism  PLAN:  1. Patient with longstanding, uncontrolled type 2 diabetes, on metformin , basal-bolus insulin  regimen and weekly GLP-1 receptor agonist, with still poor control, but an HbA1c improved at last visit, at 8.8%.  At that time, sugars also appear to have improved but they were fluctuating around the upper limit of the target range, 180 mg/dL and increasing gradually throughout the day, particularly after dinner.  Upon questioning, she was missing Toujeo  doses at night and she was also worrying that sugars may drop overnight if taking the Toujeo .  I advised her to take Toujeo  in the morning  and also to make sure that she was taking NovoLog  consistently.  She was not taking it 15 minutes before meals and I again underlined the importance of doing so.  We did not change the rest of the regimen. CGM interpretation: -At today's visit, we reviewed her CGM downloads: It appears that 42% of values are in target range (goal >70%), while 58% are higher than 180 (goal <25%), and 0% are lower than 70 (goal <4%).  The calculated average blood sugar is 192.  The projected HbA1c for the next 3 months (GMI) is 7.9%. -Reviewing the CGM trends, sugars are still improving overnight, but not quite to target but increasing after breakfast and then again after every meal.  Upon questioning, she is having dietary indiscretions.  Also, she is usually using the 1 mg of Ozempic  since she could not tolerate the higher dose.  She mentions that she is taking the NovoLog  as recommended, 15 minutes before meals.  She did notice that her blood sugars are increasing overnight without her actually eating.  We discussed that this be a consequence of the sugars dropping too  much earlier in the night.  She is dropping her blood sugars soon after midnight, but not very dramatically.  To help with this, though, I again advised her to move the entire dose of Toujeo  in the morning, as she is still taking it twice a day.  Another possible reason for the blood sugars increasing in the second half of the night is increased hepatic glucose output, in the setting of insulin  resistance.  We discussed about the need to improve diet, but also to start consistent exercise.  She agrees to do so. -I also recommended to try to rotate her injection sites more, and also use the size of the abdomen, if possible.  I advised they do slightly higher doses of NovoLog , but if the sugars start improving after starting exercise, she may need to reduce number. - I suggested to:  Patient Instructions  Please continue: - Metformin  1000 mg 2x a day with meals. - Ozempic  1-2 mg weekly  Move: - Toujeo  60 units in am - Novolog  20-25 units 15 min before each meal    Try to start exercise.   Please continue levothyroxine  112 mcg daily.  Take the thyroid  hormone every day, with water , at least 30 minutes before breakfast, separated by at least 4 hours from: - acid reflux medications - calcium  - iron - multivitamins  Please return in 3 months.  - we checked her HbA1c: 8.9% (higher) - advised to check sugars at different times of the day - 4x a day, rotating check times - advised for yearly eye exams >> she is UTD - return to clinic in 3 months  2.  Papillary thyroid  cancer -Previous thyroid  cancer records were reviewed from Dr. Christi -She has metastatic thyroid  cancer and had 4 RAI treatments.  She also had increased signal on the PET scan from 2009, however in 2010, another PET scan showed only possible inflammatory lymph nodes in the area -The latest ultrasound from 05/2020 showed no concerning masses.  Previous ultrasound showed a specific calcified region in the right upper neck in  the 2019 neck CT showed that the calcified mass was stable and most likely benign -will repeat another ultrasound at next OV -Latest thyroglobulin level obtained in 02/2023 with 0.3, slightly lower than before but overall not much changed over the last 5 years. -Will recheck the thyroglobulin level at next visit  3.  Postsurgical hypothyroidism - latest thyroid  labs reviewed with pt. >> normal Lab Results  Component Value Date   TSH 0.84 06/30/2023  - she continues on LT4 112 mcg daily - pt feels good on this dose. - we discussed about taking the thyroid  hormone every day, with water , >30 minutes before breakfast, separated by >4 hours from acid reflux medications, calcium , iron, multivitamins. Pt. is taking it correctly. - Will repeat the TSH at next visit  Lela Fendt, MD PhD [facility]

## 2023-11-10 ENCOUNTER — Other Ambulatory Visit: Payer: Self-pay | Admitting: Family Medicine

## 2023-11-10 ENCOUNTER — Other Ambulatory Visit: Payer: Self-pay | Admitting: Internal Medicine

## 2023-11-15 ENCOUNTER — Ambulatory Visit: Admitting: Nutrition

## 2023-11-16 ENCOUNTER — Ambulatory Visit (HOSPITAL_COMMUNITY)

## 2023-11-16 ENCOUNTER — Encounter (HOSPITAL_COMMUNITY): Payer: Self-pay

## 2023-11-16 NOTE — Therapy (Signed)
 PHYSICAL THERAPY DISCHARGE SUMMARY  Visits from Start of Care: 7  Current functional level related to goals / functional outcomes: See last visit on 10/26/23   Remaining deficits: See last visit on 10/26/23   Education / Equipment: HEP   Patient agrees to discharge. Patient goals were partially met. Patient is being discharged due to the patient's request.  Patient arrives for last scheduled visit but reports she's having a lot of back pain. Reports she did not want to miss last appointment but she does not believe she would be able to participate in PT this date. Has an appointment scheduled with MD for next day. Reports she is pleased with her current functional level. Has not had any falls since June. Reports she believe she can maintain progress independently through HEP compliance. Patient requests to be discharged this date.    3:00 PM, 11/16/23 Rosaria Settler, PT, DPT [facility] [place]

## 2023-11-17 ENCOUNTER — Encounter: Payer: Self-pay | Admitting: Family Medicine

## 2023-11-17 ENCOUNTER — Telehealth: Payer: Self-pay

## 2023-11-17 ENCOUNTER — Ambulatory Visit (INDEPENDENT_AMBULATORY_CARE_PROVIDER_SITE_OTHER): Payer: Self-pay | Admitting: Family Medicine

## 2023-11-17 VITALS — BP 139/69 | HR 79 | Ht 65.0 in | Wt 179.0 lb

## 2023-11-17 DIAGNOSIS — R109 Unspecified abdominal pain: Secondary | ICD-10-CM | POA: Diagnosis not present

## 2023-11-17 MED ORDER — NITROFURANTOIN MONOHYD MACRO 100 MG PO CAPS: 100.0000 mg | ORAL_CAPSULE | Freq: Two times a day (BID) | ORAL | 0 refills | Status: AC

## 2023-11-17 NOTE — Patient Instructions (Addendum)
 I appreciate the opportunity to provide care to you today!   Labs: please stop by the lab today to get your blood drawn (CBC, BMP)  Right Flank Pain: -Please stop by [facility] for a CT abdomen/pelvis to assess for kidney stones. -Continue taking Tylenol  as needed for pain relief. -Encourage adequate hydration  -Strain urine if kidney stone is suspected in order to capture the stone for analysis. -Please ensure you complete the full course of the antibiotics as prescribed.  To help prevent future urinary tract infections (UTIs), consider the following practices:  Avoid Prolonged Urination: Do not hold urine for extended periods, as this can stretch the bladder and create an environment conducive to bacterial growth. Prompt Bladder Emptying: Empty your bladder as soon as you feel the urge. Post-Intercourse Hygiene: Empty your bladder soon after intercourse to reduce the risk of infection. Shower Instead of Bath: Opt for showers rather than baths to minimize bacterial exposure. Proper Wiping Technique: Wipe from front to back after urinating and bowel movements to prevent the transfer of bacteria from the anal region to the vagina and urethra. Hydration: Drink a full glass of water  regularly to help flush bacteria from your system.   Please follow up if your symptoms worsen or fail to improve.  Please continue to a heart-healthy diet and increase your physical activities. Try to exercise for at least five days a week.    It was a pleasure to see you and I look forward to continuing to work together on your health and well-being. Please do not hesitate to call the office if you need care or have questions about your care.  In case of emergency, please visit the Emergency Department for urgent care, or contact our clinic at [phone] to schedule an appointment. We're here to help you!   Have a wonderful day and week. With Gratitude, Venetta Knee MSN, FNP-BC

## 2023-11-17 NOTE — Assessment & Plan Note (Signed)
 Urinalysis: Slightly cloudy urine with trace leukocytes. Start Macrobid  100 mg BID for 5 days for possible urinary tract infection.  Patient encouraged to stop by [facility] for a CT abdomen/pelvis to further evaluate for kidney stones.  Symptomatic management: Continue Tylenol  as needed for pain relief. Encourage adequate hydration. Strain urine if kidney stones are suspected in order to capture a stone for analysis. Patient education: Please ensure completion of the full course of antibiotics as prescribed.

## 2023-11-17 NOTE — Progress Notes (Signed)
 Acute Office Visit  Subjective:    Patient ID: Jennifer Blackburn, female    DOB: [birth year], 78 y.o.   MRN: [id number]  Chief Complaint  Patient presents with   Flank Pain    HPI The patient presents with complaints of right flank pain for the past 2 weeks. She describes the pain as intermittent, "catches her and hits her," with severity rated 9-10/10. She reports using Tylenol  with minimal relief. She denies hematuria, dysuria, nausea, vomiting, fever, or chills.  Past Medical History:  Diagnosis Date   Anxiety    Arthritis    CAD (coronary artery disease)    Depression    Diabetes mellitus type II    without complication   DJD (degenerative joint disease) of lumbar spine    Hypercholesteremia    Hyperlipidemia    Hypertension    benign    Hypothyroidism    Thyroid  cancer (HCC) 2001    Past Surgical History:  Procedure Laterality Date   ABDOMINAL HYSTERECTOMY     CARDIAC CATHETERIZATION     CATARACT EXTRACTION W/PHACO Right 07/17/2012   Procedure: CATARACT EXTRACTION PHACO AND INTRAOCULAR LENS PLACEMENT (IOC);  Surgeon: Cherene Mania, MD;  Location: [facility];  Service: Ophthalmology;  Laterality: Right;  CDE:25.51   CATARACT EXTRACTION W/PHACO Left 09/17/2016   Procedure: CATARACT EXTRACTION PHACO AND INTRAOCULAR LENS PLACEMENT LEFT EYE;  Surgeon: Mania Cherene, MD;  Location: [facility];  Service: Ophthalmology;  Laterality: Left;  CDE: 19.23   COLONOSCOPY     COLONOSCOPY N/A 01/15/2014   Procedure: COLONOSCOPY;  Surgeon: Lamar CHRISTELLA Hollingshead, MD;  Location: [facility];  Service: Endoscopy;  Laterality: N/A;  9:00 AM   COLONOSCOPY WITH PROPOFOL  N/A 01/25/2019   Procedure: COLONOSCOPY WITH PROPOFOL ;  Surgeon: Hollingshead Lamar CHRISTELLA, MD; diverticulosis in the entire colon, otherwise normal exam.  No repeat colonoscopy due to age.   DOPPLER ECHOCARDIOGRAPHY     ESOPHAGOGASTRODUODENOSCOPY (EGD) WITH PROPOFOL  N/A 01/25/2019   Procedure: ESOPHAGOGASTRODUODENOSCOPY (EGD) WITH PROPOFOL ;  Surgeon:  Hollingshead Lamar CHRISTELLA, MD; normal esophagus without dilation due to inability to pass dilator beyond the hypopharynx, 1 small nonbleeding gastric ulcer s/p biopsied, otherwise normal exam.  Pathology with ulcer with reactive changes, no H. pylori, metaplasia, dysplasia, or malignancy.   ESOPHAGOGASTRODUODENOSCOPY (EGD) WITH PROPOFOL  N/A 08/21/2019   Procedure: ESOPHAGOGASTRODUODENOSCOPY (EGD) WITH PROPOFOL ;  Surgeon: Hollingshead Lamar CHRISTELLA, MD; Normal esophagus. Previously noted gastric ulcer completely healed. Normal examined duodenum.    JOINT REPLACEMENT  07/01/2010   left hip   KNEE SURGERY Right    arthroscopy   left hip replaced  07/01/2010   SPINE SURGERY  2006   cervical   stress dipyridamole myocardial perfusion     THYROIDECTOMY     TONSILLECTOMY     VESICOVAGINAL FISTULA CLOSURE W/ TAH      Family History  Problem Relation Age of Onset   Heart attack Father    Heart failure Mother    Asthma Daughter    Sleep apnea Son        CPAP   Colon cancer Neg Hx     Social History   Socioeconomic History   Marital status: Married    Spouse name: Not on file   Number of children: 2   Years of education: 14   Highest education level: Some college, no degree  Occupational History   Occupation: retired     Comment: bank  Tobacco Use   Smoking status: Never    Passive exposure: Yes  Smokeless tobacco: Never   Tobacco comments:    Husband smokes in the home  Vaping Use   Vaping status: Never Used  Substance and Sexual Activity   Alcohol use: No   Drug use: No   Sexual activity: Not Currently  Other Topics Concern   Not on file  Social History Narrative   Lives with husband, at home   No caffeine   Social Drivers of Corporate investment banker Strain: Low Risk  (09/14/2023)   Overall Financial Resource Strain (CARDIA)    Difficulty of Paying Living Expenses: Not hard at all  Food Insecurity: No Food Insecurity (09/14/2023)   Hunger Vital Sign    Worried About Running Out of Food  in the Last Year: Never true    Ran Out of Food in the Last Year: Never true  Transportation Needs: No Transportation Needs (09/14/2023)   PRAPARE - Administrator, Civil Service (Medical): No    Lack of Transportation (Non-Medical): No  Physical Activity: Insufficiently Active (09/14/2023)   Exercise Vital Sign    Days of Exercise per Week: 4 days    Minutes of Exercise per Session: 30 min  Stress: No Stress Concern Present (09/14/2023)   [employer] of Occupational Health - Occupational Stress Questionnaire    Feeling of Stress: Not at all  Social Connections: Socially Integrated (09/14/2023)   Social Connection and Isolation Panel    Frequency of Communication with Friends and Family: More than three times a week    Frequency of Social Gatherings with Friends and Family: Twice a week    Attends Religious Services: More than 4 times per year    Active Member of [employer] or Organizations: Yes    Attends Engineer, structural: More than 4 times per year    Marital Status: Married  Catering manager Violence: Not At Risk (09/14/2023)   Humiliation, Afraid, Rape, and Kick questionnaire    Fear of Current or Ex-Partner: No    Emotionally Abused: No    Physically Abused: No    Sexually Abused: No    Outpatient Medications Prior to Visit  Medication Sig Dispense Refill   ACCU-CHEK GUIDE test strip USE AS INSTRUCTED TO CHECK BLOOD SUGAR 2 TIMES PER DAY DX CODE E11.65 100 strip 3   Cetirizine  HCl 10 MG TBDP Take one tablet by mouth two times daily for allergy 60 tablet 5   Cholecalciferol (VITAMIN D3) 50 MCG (2000 UT) TABS Take 2,000 Units by mouth daily.     ezetimibe  (ZETIA ) 10 MG tablet TAKE 1 TABLET BY MOUTH EVERY DAY 90 tablet 1   gabapentin  (NEURONTIN ) 100 MG capsule TAKE ONE CAPSULE TWICE DAILY AND THREE AT BEDTIME 150 capsule 1   hydrOXYzine  (ATARAX ) 10 MG tablet Take one tablet twice daily and two tablets at bedtime as needed for generalized itching. 360 tablet 1    imipramine  (TOFRANIL ) 50 MG tablet Take 1 tablet (50 mg total) by mouth at bedtime. 90 tablet 3   insulin  aspart (NOVOLOG  FLEXPEN) 100 UNIT/ML FlexPen Inject 20-25 Units into the skin 3 (three) times daily with meals.     insulin  glargine, 1 Unit Dial , (TOUJEO  SOLOSTAR) 300 UNIT/ML Solostar Pen INJECT 60 UNITS under skin  DAILY     Insulin  Pen Needle 32G X 4 MM MISC Use 4x a day 300 each 3   isosorbide  mononitrate (IMDUR ) 30 MG 24 hr tablet TAKE 1/2 TABLET BY MOUTH DAILY 45 tablet 3   Lancets (ACCU-CHEK MULTICLIX)  lancets Use as instructed three times daily dx 250.01 100 each 5   levothyroxine  (SYNTHROID ) 112 MCG tablet TAKE 1 TABLET BY MOUTH EVERY DAY BEFORE BREAKFAST 90 tablet 3   metFORMIN  (GLUCOPHAGE ) 1000 MG tablet Take 1 tablet (1,000 mg total) by mouth 2 (two) times daily. 180 tablet 3   metoprolol  tartrate (LOPRESSOR ) 50 MG tablet TAKE 1 TABLET BY MOUTH TWICE A DAY 180 tablet 3   pantoprazole  (PROTONIX ) 40 MG tablet TAKE 1 TABLET BY MOUTH EVERY DAY 90 tablet 3   PARoxetine  (PAXIL ) 20 MG tablet TAKE 1 TABLET EVERY DAY 90 tablet 3   polyethylene glycol powder (GLYCOLAX /MIRALAX ) 17 GM/SCOOP powder Take 1 Container by mouth daily as needed.     pravastatin  (PRAVACHOL ) 80 MG tablet TAKE 1 TABLET EVERY DAY 90 tablet 3   Semaglutide , 2 MG/DOSE, 8 MG/3ML SOPN Inject 2 mg as directed once a week. (Patient taking differently: Inject 1 mg as directed once a week.) 9 mL 2   No facility-administered medications prior to visit.    Allergies  Allergen Reactions   Lipitor [Atorvastatin  Calcium ] Other (See Comments)    Muscle aches   Actos [Pioglitazone] Other (See Comments)    Peripheral edema   Crestor  [Rosuvastatin ] Other (See Comments)    Muscle aches   Daypro [Oxaprozin] Hives   Nsaids Hives   Sulfonamide Derivatives Hives    Review of Systems  Constitutional:  Negative for chills and fever.  Eyes:  Negative for visual disturbance.  Respiratory:  Negative for chest tightness and  shortness of breath.   Genitourinary:  Positive for flank pain.  Neurological:  Negative for dizziness and headaches.       Objective:    Physical Exam HENT:     Head: Normocephalic.     Mouth/Throat:     Mouth: Mucous membranes are moist.  Cardiovascular:     Rate and Rhythm: Normal rate.     Heart sounds: Normal heart sounds.  Pulmonary:     Effort: Pulmonary effort is normal.     Breath sounds: Normal breath sounds.  Musculoskeletal:     Lumbar back: Tenderness present.  Neurological:     Mental Status: She is alert.     BP 139/69   Pulse 79   Ht 5' 5 (1.651 m)   Wt 179 lb (81.2 kg)   LMP 11/11/2016   SpO2 96%   BMI 29.79 kg/m  Wt Readings from Last 3 Encounters:  11/17/23 179 lb (81.2 kg)  11/09/23 178 lb (80.7 kg)  11/02/23 179 lb 6.4 oz (81.4 kg)       Assessment & Plan:  Right flank pain Assessment & Plan: Urinalysis: Slightly cloudy urine with trace leukocytes. Start Macrobid  100 mg BID for 5 days for possible urinary tract infection.  Patient encouraged to stop by [facility] for a CT abdomen/pelvis to further evaluate for kidney stones.  Symptomatic management: Continue Tylenol  as needed for pain relief. Encourage adequate hydration. Strain urine if kidney stones are suspected in order to capture a stone for analysis. Patient education: Please ensure completion of the full course of antibiotics as prescribed.    Orders: -     Urinalysis -     Urine Culture -     CT ABDOMEN PELVIS WO CONTRAST -     CBC with Differential/Platelet -     BMP8+EGFR -     CBC with Differential/Platelet -     Basic metabolic panel with GFR -  Nitrofurantoin  [employer]; Take 1 capsule (100 mg total) by mouth 2 (two) times daily for 5 days.  Dispense: 10 capsule; Refill: 0  Note: This chart has been completed using Engineer, civil (consulting) software, and while attempts have been made to ensure accuracy, certain words and phrases may not be transcribed  as intended.    Haliyah Fryman, FNP

## 2023-11-17 NOTE — Telephone Encounter (Signed)
 Patient Assistance  Medication:Ozempic   Dosage:2 mg Quantity:4 boxes  Medication:Novolog   Dosage:U100 Quantity:5 boxes  [employer]

## 2023-11-18 ENCOUNTER — Telehealth: Payer: Self-pay

## 2023-11-18 LAB — BASIC METABOLIC PANEL WITH GFR
BUN/Creatinine Ratio: 13 (ref 12–28)
BUN: 11 mg/dL (ref 8–27)
CO2: 25 mmol/L (ref 20–29)
Calcium: 8.8 mg/dL (ref 8.7–10.3)
Chloride: 99 mmol/L (ref 96–106)
Creatinine, Ser: 0.83 mg/dL (ref 0.57–1.00)
Glucose: 138 mg/dL — ABNORMAL HIGH (ref 70–99)
Potassium: 4.4 mmol/L (ref 3.5–5.2)
Sodium: 138 mmol/L (ref 134–144)
eGFR: 72 mL/min/1.73 (ref >59)

## 2023-11-18 LAB — CBC WITH DIFFERENTIAL/PLATELET
Basophils Absolute: 0.1 x10E3/uL (ref 0.0–0.2)
Basos: 1 %
EOS (ABSOLUTE): 0.4 x10E3/uL (ref 0.0–0.4)
Eos: 6 %
Hematocrit: 36.1 % (ref 34.0–46.6)
Hemoglobin: 11.2 g/dL (ref 11.1–15.9)
Immature Grans (Abs): 0 x10E3/uL (ref 0.0–0.1)
Immature Granulocytes: 0 %
Lymphocytes Absolute: 2.7 x10E3/uL (ref 0.7–3.1)
Lymphs: 40 %
MCH: 26.9 pg (ref 26.6–33.0)
MCHC: 31 g/dL — ABNORMAL LOW (ref 31.5–35.7)
MCV: 87 fL (ref 79–97)
Monocytes Absolute: 0.5 x10E3/uL (ref 0.1–0.9)
Monocytes: 7 %
Neutrophils Absolute: 3.2 x10E3/uL (ref 1.4–7.0)
Neutrophils: 46 %
Platelets: 256 x10E3/uL (ref 150–450)
RBC: 4.17 x10E6/uL (ref 3.77–5.28)
RDW: 12.4 % (ref 11.7–15.4)
WBC: 6.9 x10E3/uL (ref 3.4–10.8)

## 2023-11-18 NOTE — Telephone Encounter (Signed)
 Patient called to see what time office was closing today due to holiday. Patient called back no further questions.

## 2023-11-18 NOTE — Telephone Encounter (Signed)
 Patient picked up patient assistance - Log Noted Patient picked up Medication:Ozempic  Dosage:2 mg Quantity:4 boxes,Medication:Novolog  Dosage:U100 Quantity:5 boxes on 11/18/23

## 2023-11-19 ENCOUNTER — Ambulatory Visit: Payer: Self-pay | Admitting: Family Medicine

## 2023-11-19 LAB — URINALYSIS
Bilirubin, UA: NEGATIVE
Glucose, UA: NEGATIVE
Nitrite, UA: NEGATIVE
RBC, UA: NEGATIVE
Specific Gravity, UA: 1.025 (ref 1.005–1.030)
Urobilinogen, Ur: 0.2 mg/dL (ref 0.2–1.0)
pH, UA: 5.5 (ref 5.0–7.5)

## 2023-11-19 LAB — URINE CULTURE

## 2023-11-22 ENCOUNTER — Ambulatory Visit: Payer: Self-pay

## 2023-11-22 NOTE — Telephone Encounter (Signed)
 lmtrc

## 2023-11-22 NOTE — Telephone Encounter (Signed)
 FYI Only or Action Required?: FYI only for provider.  Patient was last seen in primary care on 11/17/2023 by Zarwolo, Gloria, FNP.  Called Nurse Triage reporting No chief complaint on file..  Symptoms began today.  Interventions attempted: Nothing.  Symptoms are: stable.  Triage Disposition: Information or Advice Only Call  Patient/caregiver understands and will follow disposition?: YesCopied from CRM [id number]. Topic: Clinical - Lab/Test Results >> Nov 22, 2023 10:09 AM Gustabo D wrote: Go over labs Reason for Disposition  Health information question, no triage required and triager able to answer question  Answer Assessment - Initial Assessment Questions 1. REASON FOR CALL: What is the main reason for your call? or How can I best help you? Pt wanted to go over labs. No further questions.  Protocols used: Information Only Call - No Triage-A-AH

## 2023-11-24 ENCOUNTER — Ambulatory Visit (HOSPITAL_COMMUNITY)
Admission: RE | Admit: 2023-11-24 | Discharge: 2023-11-24 | Disposition: A | Discharge status: Home / Self Care | Source: Ambulatory Visit | Attending: Family Medicine | Admitting: Family Medicine

## 2023-11-24 DIAGNOSIS — R109 Unspecified abdominal pain: Secondary | ICD-10-CM | POA: Diagnosis not present

## 2023-11-24 DIAGNOSIS — N281 Cyst of kidney, acquired: Secondary | ICD-10-CM | POA: Diagnosis not present

## 2023-11-24 DIAGNOSIS — K8020 Calculus of gallbladder without cholecystitis without obstruction: Secondary | ICD-10-CM | POA: Diagnosis not present

## 2023-11-24 DIAGNOSIS — N200 Calculus of kidney: Secondary | ICD-10-CM | POA: Diagnosis not present

## 2023-11-30 ENCOUNTER — Telehealth: Payer: Self-pay

## 2023-11-30 NOTE — Telephone Encounter (Signed)
 Ozempic  2 mg #4 boxes exp date:01/19/2026  This shipment has been here since 06/2023 I called and left a detailed message

## 2023-12-01 ENCOUNTER — Ambulatory Visit (HOSPITAL_COMMUNITY): Admitting: Psychiatry

## 2023-12-03 NOTE — Progress Notes (Signed)
 Please inform the patient: Your imaging study shows non-obstructing kidney stones up to 8 mm. They are not blocking urine flow now and may not cause symptoms, but could lead to pain or other issues if they move. Please stay well hydrated, and seek medical attention if you develop severe pain, fever, nausea/vomiting, or trouble urinating.

## 2023-12-07 NOTE — Telephone Encounter (Signed)
 Patient picked up patient assistance - Log Noted

## 2023-12-08 ENCOUNTER — Ambulatory Visit: Admitting: Family Medicine

## 2023-12-08 DIAGNOSIS — E1165 Type 2 diabetes mellitus with hyperglycemia: Secondary | ICD-10-CM | POA: Diagnosis not present

## 2023-12-16 ENCOUNTER — Telehealth: Payer: Self-pay | Admitting: Family Medicine

## 2023-12-16 NOTE — Telephone Encounter (Signed)
 Copied from CRM #[id number]. Topic: Referral - Status >> Dec 16, 2023  9:16 AM Olam RAMAN wrote: Reason for CRM: [facility] [id number] MRN need to update ref to be seen on monday 9/29 to be seen for diabetes education. DX code e11.59 CB number [phone] fax: no fax

## 2023-12-19 ENCOUNTER — Other Ambulatory Visit: Payer: Self-pay

## 2023-12-19 ENCOUNTER — Encounter: Attending: Family Medicine | Admitting: Nutrition

## 2023-12-19 DIAGNOSIS — E782 Mixed hyperlipidemia: Secondary | ICD-10-CM

## 2023-12-19 DIAGNOSIS — E1159 Type 2 diabetes mellitus with other circulatory complications: Secondary | ICD-10-CM | POA: Diagnosis not present

## 2023-12-19 DIAGNOSIS — Z7984 Long term (current) use of oral hypoglycemic drugs: Secondary | ICD-10-CM | POA: Insufficient documentation

## 2023-12-19 DIAGNOSIS — I152 Hypertension secondary to endocrine disorders: Secondary | ICD-10-CM | POA: Diagnosis not present

## 2023-12-19 DIAGNOSIS — Z794 Long term (current) use of insulin: Secondary | ICD-10-CM | POA: Insufficient documentation

## 2023-12-19 DIAGNOSIS — Z713 Dietary counseling and surveillance: Secondary | ICD-10-CM | POA: Insufficient documentation

## 2023-12-19 DIAGNOSIS — E1169 Type 2 diabetes mellitus with other specified complication: Secondary | ICD-10-CM | POA: Diagnosis present

## 2023-12-19 DIAGNOSIS — E669 Obesity, unspecified: Secondary | ICD-10-CM | POA: Insufficient documentation

## 2023-12-19 DIAGNOSIS — I10 Essential (primary) hypertension: Secondary | ICD-10-CM

## 2023-12-19 DIAGNOSIS — I2510 Atherosclerotic heart disease of native coronary artery without angina pectoris: Secondary | ICD-10-CM

## 2023-12-19 NOTE — Telephone Encounter (Signed)
 There needs to be a new Referral placed for Nutriton and the DX needs to be placed as E11.59 (since her old Referral has expired).  Please place referral as requested :)

## 2023-12-19 NOTE — Progress Notes (Unsigned)
 Medical Nutrition Therapy  Appointment Start time:  1500 Appointment End time:  1530  Primary concerns today: DM Typ3 2  Referral diagnosis: E11.8 Preferred learning style: No Preference  Learning readiness: Ready   NUTRITION ASSESSMENT Dm Follow up   Had fell when trying to get a flower basket at a store recently. Is unsteady at times. Got PT for therapy to help her balance. Toujeo  60 units at night anad 18 units of Novolog  16-18 units with meals. Doesn't 'use a sliding scale but willing to work with one. Libre shows 14% % TIR. 60% TAR, 26% Very high. NO low blood sugars GMI 8.7%. Avg Glucose 224 mg/dl. Her husband helps to put on her sensor. Hasn't put one on for a few days. She notes she sometimes skips meals. She notes she is taking 18 units of Novolog  with some meals. She admits her biggest issues is not making the best food choices. Had been having a lot of celebrations that limit her to making good choices. She notes she has all the symptoms of hyperglycemia. Lacks energy to exercise, lacks motivation to eat well. Lacks support at home to make healthy food choices. On Ozempic  1.0. She notes she has a lot of GI issues with it and then doesn't eat at times. Endo Dr. Trixie. PCP Dr. Antonetta   She is willing to work with me to help her work on food consistency, better food choices and doing better self care with food.   Clinical Medical Hx:  Past Medical History:  Diagnosis Date   Anxiety    Arthritis    CAD (coronary artery disease)    Depression    Diabetes mellitus type II    without complication   DJD (degenerative joint disease) of lumbar spine    Hypercholesteremia    Hyperlipidemia    Hypertension    benign    Hypothyroidism    Thyroid  cancer (HCC) 2001    Medications:  Current Outpatient Medications on File Prior to Visit  Medication Sig Dispense Refill   ACCU-CHEK GUIDE test strip USE AS INSTRUCTED TO CHECK BLOOD SUGAR 2 TIMES PER DAY DX CODE E11.65 100 strip  3   Cetirizine  HCl 10 MG TBDP Take one tablet by mouth two times daily for allergy 60 tablet 5   Cholecalciferol (VITAMIN D3) 50 MCG (2000 UT) TABS Take 2,000 Units by mouth daily.     ezetimibe  (ZETIA ) 10 MG tablet TAKE 1 TABLET BY MOUTH EVERY DAY 90 tablet 1   gabapentin  (NEURONTIN ) 100 MG capsule TAKE ONE CAPSULE TWICE DAILY AND THREE AT BEDTIME 150 capsule 1   hydrOXYzine  (ATARAX ) 10 MG tablet Take one tablet twice daily and two tablets at bedtime as needed for generalized itching. 360 tablet 1   imipramine  (TOFRANIL ) 50 MG tablet Take 1 tablet (50 mg total) by mouth at bedtime. 90 tablet 3   insulin  aspart (NOVOLOG  FLEXPEN) 100 UNIT/ML FlexPen Inject 20-25 Units into the skin 3 (three) times daily with meals.     insulin  glargine, 1 Unit Dial , (TOUJEO  SOLOSTAR) 300 UNIT/ML Solostar Pen INJECT 60 UNITS under skin  DAILY     Insulin  Pen Needle 32G X 4 MM MISC Use 4x a day 300 each 3   isosorbide  mononitrate (IMDUR ) 30 MG 24 hr tablet TAKE 1/2 TABLET BY MOUTH DAILY 45 tablet 3   Lancets (ACCU-CHEK MULTICLIX) lancets Use as instructed three times daily dx 250.01 100 each 5   levothyroxine  (SYNTHROID ) 112 MCG tablet TAKE 1 TABLET BY  MOUTH EVERY DAY BEFORE BREAKFAST 90 tablet 3   metFORMIN  (GLUCOPHAGE ) 1000 MG tablet Take 1 tablet (1,000 mg total) by mouth 2 (two) times daily. 180 tablet 3   metoprolol  tartrate (LOPRESSOR ) 50 MG tablet TAKE 1 TABLET BY MOUTH TWICE A DAY 180 tablet 3   pantoprazole  (PROTONIX ) 40 MG tablet TAKE 1 TABLET BY MOUTH EVERY DAY 90 tablet 3   PARoxetine  (PAXIL ) 20 MG tablet TAKE 1 TABLET EVERY DAY 90 tablet 3   polyethylene glycol powder (GLYCOLAX /MIRALAX ) 17 GM/SCOOP powder Take 1 Container by mouth daily as needed.     pravastatin  (PRAVACHOL ) 80 MG tablet TAKE 1 TABLET EVERY DAY 90 tablet 3   Semaglutide , 2 MG/DOSE, 8 MG/3ML SOPN Inject 2 mg as directed once a week. (Patient taking differently: Inject 1 mg as directed once a week.) 9 mL 2   No current  facility-administered medications on file prior to visit.    Labs:  Lab Results  Component Value Date   HGBA1C 8.8 (A) 06/30/2023    Notable Signs/Symptoms: Increased fatigue, frequent urination, hunger, poor sleep  Lifestyle & Dietary Hx Lives with her husband. Retired.  Estimated daily fluid intake: 40 oz Supplements:  Sleep: Poor Stress / self-care: her health Current average weekly physical activity: ADL  24-Hr Dietary Recall Eats 3 meals per day. Tends to have a lot of processed food due to husband's preferences. Eats out some.  Estimated Energy Needs Calories: 1200 Carbohydrate: 135g Protein: 90g Fat: 33g   NUTRITION DIAGNOSIS  NB-1.1 Food and nutrition-related knowledge deficit As related to Diabetes Type 2 Uncontrolled.  As evidenced by A1C 10.1%.   NUTRITION INTERVENTION  Nutrition education (E-1) on the following topics:  Importance of timing of meals, taking proper insulin  dosing and starting to use sliding scale.  Complications related to uncontrolled blood sugars. Sliding scale schedule  Medications: Toujeo  60 units and Novolog  18 units TID  Handouts Provided Include  and goals  Sliding scale: Novolog  20 units 90-150    151-200 +1 unit   for total 21 units     201-250 +2 units           22 units     251-300 +3  units          23 units     301-350  +4  units         24 units     351-400   + 5  units       25 units Call  Dr. Trixie if BS are > 300 mg/dl  3 times in row or 3 times in a week for medication adjustments. Drink 4 bottles per water  a day    MONITORING & EVALUATION Dietary intake, weekly physical activity, and BS  in 2 weeks.SABRA  Next Steps  Patient is to work on meal planning Eating 3 meals per day.SABRASABRA

## 2023-12-19 NOTE — Telephone Encounter (Signed)
 Referral placed.

## 2023-12-20 ENCOUNTER — Encounter: Payer: Self-pay | Admitting: Nutrition

## 2023-12-20 NOTE — Patient Instructions (Signed)
   Eat 3 meals per day  Take insulin  10-15 minutes before meals and then eat meal.  Sliding scale: Novolog  20 units 90-150    151-200 +1 unit   for total 21 units     201-250 +2 units           22 units     251-300 +3  units          23 units     301-350  +4  units         24 units     351-400   + 5  units       25 units Call  Dr. Trixie if BS are > 300 mg/dl  3 times in row or 3 times in a week for medication adjustments.  Drink 4 bottles of water  per day

## 2023-12-21 ENCOUNTER — Ambulatory Visit (INDEPENDENT_AMBULATORY_CARE_PROVIDER_SITE_OTHER): Admitting: Psychiatry

## 2023-12-21 DIAGNOSIS — Z630 Problems in relationship with spouse or partner: Secondary | ICD-10-CM | POA: Diagnosis not present

## 2023-12-21 DIAGNOSIS — F439 Reaction to severe stress, unspecified: Secondary | ICD-10-CM

## 2023-12-21 NOTE — Progress Notes (Signed)
 IN-PERSON  THERAPIST PROGRESS NOTE  Session Time:  Wednesday 12/21/2023 1:10 PM - 2:00 PM   Participation Level: Active  Behavioral Response: CasualAlertAnxious  Type of Therapy: Individual Therapy  Treatment Goals addressed: Verbalize an understanding and resolution of current interpersonal problems Learn and implement conflict resolution skills to resolve interpersonal problems   ProgressTowards Goals: Progressing  Interventions: Supportive/CBT  Summary: Jennifer Blackburn is a 78 y.o. female who is referred for services by PCP Dr. Rollene Pesa due to pt experiencing stress. She denies any psychiatric hospitalizations. She is a returning pt to this clinician and last was seen in 2016.  Patient is resuming services as she reports increased stress triggered by health issues, increased fatigue, and marital issues.  Patient's current symptoms include fatigue, worry, and muscle tension.  Patient last was seen about 5-6 weeks  ago.  She reports continued stress regarding marital issues.  Per her report, she recently tried to express her thoughts and feelings to husband regarding the way he treats her but having difficulty using assertive communication.  She expresses hurt and anger.  She reports a pattern of trying to please her husband as well as trying to please others. She also expresses frustration as she states husband does not help with household responsibilities.  She also reports noticing increased worry in the past couple of weeks about a variety of issues but attributes this to not feeling well due to having a virus.   Suicidal/Homicidal: Nowithout intent/plan  Therapist Response: Reviewed symptoms, facilitated patient sharing more information regarding the relationship with her husband, assisted patient continue to identify her pattern of interaction/communication with husband, began to discuss pt's expectations of self as well as expectations regarding her marriage, began to assist pt  identify realistic expectations, began to assist pt explore ways to set/maintain her limits regarding some of her household responsibilities to assist pt identify ways to reduce stress and improve self-care,\ Plan: Return again in 2 weeks.  Diagnosis: Stress due to marital problems  Collaboration of Care: Primary Care Provider AEB patient sees PCP Dr. Rollene Pesa  Patient/Guardian was advised Release of Information must be obtained prior to any record release in order to collaborate their care with an outside provider. Patient/Guardian was advised if they have not already done so to contact the registration department to sign all necessary forms in order for us  to release information regarding their care.   Consent: Patient/Guardian gives verbal consent for treatment and assignment of benefits for services provided during this visit. Patient/Guardian expressed understanding and agreed to proceed.   Winton FORBES Rubinstein, LCSW 12/21/2023

## 2024-01-02 ENCOUNTER — Encounter: Attending: Family Medicine | Admitting: Nutrition

## 2024-01-02 VITALS — Ht 65.0 in | Wt 181.6 lb

## 2024-01-02 DIAGNOSIS — E1159 Type 2 diabetes mellitus with other circulatory complications: Secondary | ICD-10-CM | POA: Insufficient documentation

## 2024-01-02 NOTE — Patient Instructions (Signed)
 Goals Keep up the great job. Get signed up and exercise twice a week. Call Dr. Antonetta or talk to her about Paxil  medications. Ask about medication like Celexa or equivalent for anxiety. Not xanax as it has side effects.

## 2024-01-02 NOTE — Progress Notes (Signed)
 Medical Nutrition Therapy  Appointment Start time:  1430 Appointment End time:  1500  Primary concerns today: DM Typ3 2  Referral diagnosis: E11.8 Preferred learning style: No Preference  Learning readiness: Ready   NUTRITION ASSESSMENT Dm Follow up   Has been drinking more water . Has been eating more vegetabless Seeing a therapist--once a month 60 units of Toujoe in morning.. Novolog  18  wit meals  Sees Dr. Trixie, Endocrinologist.  Herlene TIR 61%, High 38%, Very high 1%. GMI 7.4%, Ag Glucose 1781 mg/dl. Hasn't been able to exercise as much due to sprained ankle. Has been confused how to use sliding scale insulin .  Hand written chart provided. She notes it makes more sense and she will follow the guidelines.  Wt Readings from Last 3 Encounters:  01/02/24 181 lb 9.6 oz (82.4 kg)  11/17/23 179 lb (81.2 kg)  11/09/23 178 lb (80.7 kg)   Ht Readings from Last 3 Encounters:  01/02/24 5' 5 (1.651 m)  11/17/23 5' 5 (1.651 m)  11/09/23 5' 5 (1.651 m)   Body mass index is 30.22 kg/m. @BMIFA @ Facility age limit for growth %iles is 20 years. Facility age limit for growth %iles is 20 years.  Clinical Medical Hx:  Past Medical History:  Diagnosis Date   Anxiety    Arthritis    CAD (coronary artery disease)    Depression    Diabetes mellitus type II    without complication   DJD (degenerative joint disease) of lumbar spine    Hypercholesteremia    Hyperlipidemia    Hypertension    benign    Hypothyroidism    Thyroid  cancer (HCC) 2001    Medications:  Current Outpatient Medications on File Prior to Visit  Medication Sig Dispense Refill   ACCU-CHEK GUIDE test strip USE AS INSTRUCTED TO CHECK BLOOD SUGAR 2 TIMES PER DAY DX CODE E11.65 100 strip 3   Cetirizine  HCl 10 MG TBDP Take one tablet by mouth two times daily for allergy 60 tablet 5   Cholecalciferol (VITAMIN D3) 50 MCG (2000 UT) TABS Take 2,000 Units by mouth daily.     ezetimibe  (ZETIA ) 10 MG tablet TAKE 1 TABLET  BY MOUTH EVERY DAY 90 tablet 1   gabapentin  (NEURONTIN ) 100 MG capsule TAKE ONE CAPSULE TWICE DAILY AND THREE AT BEDTIME 150 capsule 1   hydrOXYzine  (ATARAX ) 10 MG tablet Take one tablet twice daily and two tablets at bedtime as needed for generalized itching. 360 tablet 1   imipramine  (TOFRANIL ) 50 MG tablet Take 1 tablet (50 mg total) by mouth at bedtime. 90 tablet 3   insulin  aspart (NOVOLOG  FLEXPEN) 100 UNIT/ML FlexPen Inject 20-25 Units into the skin 3 (three) times daily with meals.     insulin  glargine, 1 Unit Dial , (TOUJEO  SOLOSTAR) 300 UNIT/ML Solostar Pen INJECT 60 UNITS under skin  DAILY     Insulin  Pen Needle 32G X 4 MM MISC Use 4x a day 300 each 3   isosorbide  mononitrate (IMDUR ) 30 MG 24 hr tablet TAKE 1/2 TABLET BY MOUTH DAILY 45 tablet 3   Lancets (ACCU-CHEK MULTICLIX) lancets Use as instructed three times daily dx 250.01 100 each 5   levothyroxine  (SYNTHROID ) 112 MCG tablet TAKE 1 TABLET BY MOUTH EVERY DAY BEFORE BREAKFAST 90 tablet 3   metFORMIN  (GLUCOPHAGE ) 1000 MG tablet Take 1 tablet (1,000 mg total) by mouth 2 (two) times daily. 180 tablet 3   metoprolol  tartrate (LOPRESSOR ) 50 MG tablet TAKE 1 TABLET BY MOUTH TWICE A DAY 180  tablet 3   pantoprazole  (PROTONIX ) 40 MG tablet TAKE 1 TABLET BY MOUTH EVERY DAY 90 tablet 3   PARoxetine  (PAXIL ) 20 MG tablet TAKE 1 TABLET EVERY DAY 90 tablet 3   polyethylene glycol powder (GLYCOLAX /MIRALAX ) 17 GM/SCOOP powder Take 1 Container by mouth daily as needed.     pravastatin  (PRAVACHOL ) 80 MG tablet TAKE 1 TABLET EVERY DAY 90 tablet 3   Semaglutide , 2 MG/DOSE, 8 MG/3ML SOPN Inject 2 mg as directed once a week. (Patient taking differently: Inject 1 mg as directed once a week.) 9 mL 2   No current facility-administered medications on file prior to visit.    Labs:  Lab Results  Component Value Date   HGBA1C 8.8 (A) 06/30/2023    Notable Signs/Symptoms: Increased fatigue, frequent urination, hunger, poor sleep  Lifestyle & Dietary  Hx Lives with her husband. Retired.  Estimated daily fluid intake: 40 oz Supplements:  Sleep: Poor Stress / self-care: her health Current average weekly physical activity: ADL  24-Hr Dietary Recall Eats 3 meals per day. Tends to have a lot of processed food due to husband's preferences. Eats out some.  Estimated Energy Needs Calories: 1200 Carbohydrate: 135g Protein: 90g Fat: 33g   NUTRITION DIAGNOSIS  NB-1.1 Food and nutrition-related knowledge deficit As related to Diabetes Type 2 Uncontrolled.  As evidenced by A1C 10.1%.   NUTRITION INTERVENTION  Nutrition education (E-1) on the following topics:  Importance of timing of meals, taking proper insulin  dosing and starting to use sliding scale.  Complications related to uncontrolled blood sugars. Sliding scale schedule  Medications: Toujeo  60 units and Novolog  18 units TID  Goals: Goals Keep up the great job. Get signed up and exercise twice a week. Call Dr. Antonetta or talk to her about Paxil  medications. Ask about medication like Celexa or equivalent for anxiety. Not xanax as it has side effects.    Handouts Provided Include  and goals  Sliding scale: Novolog  20 units 90-150    151-200 +1 unit   for total 21 units     201-250 +2 units           22 units     251-300 +3  units          23 units     301-350  +4  units         24 units     351-400   + 5  units       25 units Call  Dr. Trixie if BS are > 300 mg/dl  3 times in row or 3 times in a week for medication adjustments. Drink 4 bottles per water  a day    MONITORING & EVALUATION Dietary intake, weekly physical activity, and BS  in 2 weeks.SABRA  Next Steps  Patient is to work on meal planning Eating 3 meals per day.SABRASABRA

## 2024-01-03 ENCOUNTER — Telehealth: Payer: Self-pay

## 2024-01-03 NOTE — Telephone Encounter (Signed)
 Copied from CRM [id number]. Topic: General - Other >> Jan 03, 2024  3:04 PM Jennifer Blackburn wrote: Reason for CRM: Pt wants to receive an exemption from jury duty letter due to her age and medical conditions. Please advise  Best contact: [phone]

## 2024-01-04 NOTE — Telephone Encounter (Signed)
 Letter provided

## 2024-01-05 ENCOUNTER — Ambulatory Visit (HOSPITAL_COMMUNITY): Admitting: Psychiatry

## 2024-01-08 DIAGNOSIS — E1165 Type 2 diabetes mellitus with hyperglycemia: Secondary | ICD-10-CM | POA: Diagnosis not present

## 2024-01-09 ENCOUNTER — Other Ambulatory Visit: Payer: Self-pay | Admitting: Family Medicine

## 2024-01-16 ENCOUNTER — Encounter: Payer: Self-pay | Admitting: Nutrition

## 2024-01-19 ENCOUNTER — Ambulatory Visit

## 2024-01-19 DIAGNOSIS — L738 Other specified follicular disorders: Secondary | ICD-10-CM | POA: Diagnosis not present

## 2024-01-19 DIAGNOSIS — L821 Other seborrheic keratosis: Secondary | ICD-10-CM

## 2024-01-19 DIAGNOSIS — L810 Postinflammatory hyperpigmentation: Secondary | ICD-10-CM

## 2024-01-19 DIAGNOSIS — L820 Inflamed seborrheic keratosis: Secondary | ICD-10-CM

## 2024-01-19 DIAGNOSIS — D1801 Hemangioma of skin and subcutaneous tissue: Secondary | ICD-10-CM | POA: Diagnosis not present

## 2024-01-19 DIAGNOSIS — L814 Other melanin hyperpigmentation: Secondary | ICD-10-CM

## 2024-01-19 DIAGNOSIS — L811 Chloasma: Secondary | ICD-10-CM

## 2024-01-19 DIAGNOSIS — L680 Hirsutism: Secondary | ICD-10-CM

## 2024-01-19 MED ORDER — VANIQA 13.9 % EX CREA: TOPICAL_CREAM | CUTANEOUS | 1 refills | Status: AC

## 2024-01-19 MED ORDER — HYDROQUINONE 4 % EX CREA: TOPICAL_CREAM | Freq: Two times a day (BID) | CUTANEOUS | 0 refills | Status: AC

## 2024-01-19 MED ORDER — HYDROQUINONE 4 % EX CREA: TOPICAL_CREAM | Freq: Two times a day (BID) | CUTANEOUS | 0 refills | Status: DC

## 2024-01-19 NOTE — Progress Notes (Signed)
 Subjective   Jennifer Blackburn is a 78 y.o. female who presents for the following: Lesion(s) of concern . Patient is new patient  Today patient reports: Areas of concern on the face Excessive hair growth on the face  Review of Systems:    No other skin or systemic complaints except as noted in HPI or Assessment and Plan.  The following portions of the chart were reviewed this encounter and updated as appropriate: medications, allergies, medical history  Relevant Medical History:  n/a   Objective  Well appearing patient in no apparent distress; mood and affect are within normal limits. Examination was performed of the: Focused Exam of: Face   Examination notable for: Lentigo/lentigines: Scattered pigmented macules that are tan to brown in color and are somewhat non-uniform in shape and concentrated in the sun-exposed areas, Nevus/nevi: Scattered well-demarcated, regular, pigmented macule(s) and/or papule(s)  , Seborrheic Keratosis(es): Stuck-on appearing keratotic papule(s) on the trunk, some  irritated with redness, crusting, edema, and/or partial avulsion, Actinic Damage/Elastosis: chronic sun damage: dyspigmentation, telangiectasia, and wrinkling, Sebaceous Gland Hyperplasia: Multiple flesh-colored to yellow delled papules 2-28mm in size over forehead and midface, Melasma: Hyperpigmented macules and patches with coalescence located symmetrically on the cheeks and lateral aspect of the face  excessive hair growth of the chin Post inflammatory hyperpigmentation  Examination limited by: Clothing and Patient deferred removal     Face Stuck on waxy paps with erythema  Assessment & Plan   BENIGN SKIN FINDINGS  - Lentigines  - Seborrheic keratoses  - Nevus/Multiple Benign Nevi  - Sebaceous glad hyperplasia - Reassurance provided regarding the benign appearance of lesions noted on exam today; no treatment is indicated in the absence of symptoms/changes. - Reinforced importance of  photoprotective strategies including liberal and frequent sunscreen use of a broad-spectrum SPF 30 or greater, use of protective clothing, and sun avoidance for prevention of cutaneous malignancy and photoaging.  Counseled patient on the importance of regular self-skin monitoring as well as routine clinical skin examinations as scheduled.   Hirsutism with increased terminal hair growth on the chin  Counseled that other means of hair removal involve mostly cosmetic means - eg, Vaniqa (eflornithine cream - only FDA-approved medication for hirsutism), chemical depilatories (eg, Nair or products that contain calcium  thioglycolate), or laser hair removal Patient expresses understanding of the treatment methods for hair growth as proposed above and their various risks and limitations - patient opts for  - Start eflornithine 13.% cream twice daily. Edu: Local irritation, acne formation and pseudofolliculitis barbae. Results expected in 1-2 months. If local irritation may decrease to once daily. Explained it will be an out of pocket expense.  Melasma Post inflammatory hyperpigmentation  Lentigines  - explained the etiology of the disorder, its difficulty to treat, and chronic nature - Strict sun protection with sun avoidance and tinted broad-spectrum sunscreen (with visible light protection), ideally SPF 50+, but at least 30+ (provided handout with recommendations). Warned that even 10 minutes of unprotected sun exposure while clear can flare melasma - Counseled that this is a chronic condition that will likely resolve in later years of life - Counseled on goals of treatment - primarily prevention of flares and minimizing pigmentation, not necessarily complete clearance - recommended diligent sun protection - start compounded topical medication (Hydroquinone 8%, Tretinoin 0.025%, Kojic Acid 1%, Niacinamide 4%, Fluocinolone 0.025% Cream) from [employer] on proper use; twice daily for 3 months,  then break  Educated on skin irritation and risk of exogenous ochronosis if  used inappropriately or for long periods of time (>6 months)  Procedures, orders, diagnosis for this visit:  INFLAMED SEBORRHEIC KERATOSIS Face Symptomatic, irritating, patient would like treated. Destruction of lesion - Face Complexity: simple   Destruction method: cryotherapy   Informed consent: discussed and consent obtained   Timeout:  patient name, date of birth, surgical site, and procedure verified Lesion destroyed using liquid nitrogen: Yes   Region frozen until ice ball extended beyond lesion: Yes   Cryo cycles: 1 or 2. Outcome: patient tolerated procedure well with no complications   Post-procedure details: wound care instructions given    LENTIGO   POST-INFLAMMATORY HYPERPIGMENTATION   HIRSUTISM   SEBACEOUS GLAND HYPERPLASIA    Inflamed seborrheic keratosis -     Destruction of lesion  Lentigo  Post-inflammatory hyperpigmentation  Hirsutism  Sebaceous gland hyperplasia  Other orders -     Vaniqa; Apply topically daily to the chin  Dispense: 45 g; Refill: 1 -     Hydroquinone; Apply topically 2 (two) times daily.  Dispense: 28.35 g; Refill: 0    Return to clinic: Return in about 3 months (around 04/20/2024) for Melasma .  I, Emerick Ege, CMA am acting as scribe for Lauraine JAYSON Kanaris, MD.   Documentation: I have reviewed the above documentation for accuracy and completeness, and I agree with the above.  Lauraine JAYSON Kanaris, MD

## 2024-01-19 NOTE — Patient Instructions (Addendum)
 Start eflornithine 13.% cream twice daily on the chin.   Start compounded topical medication Hydroquinone 8%, Tretinoin 0.025%, Kojic Acid 1%, Niacinamide 4%, Fluocinolone 0.025% Cream all over the face.  Spots on face - seborrheic keratosis - can freeze these with liquid nitrogen - sebaceous gland hyperplasia - lentigines   Recommended sunscreens for face for patients with melasma: - La Roche-Posay Anthelios Mineral SPF 50 Sunscreen Tinted - SkinCeuticals Physical Fusion UV Defense Tinted Mineral - EltaMD UV Physical Tinted Facial Suncreen SPF 41 - [place] High Protection Tinted Compact SPF 50 -- this comes in darker tints   Due to recent changes in healthcare laws, you may see results of your pathology and/or laboratory studies on MyChart before the doctors have had a chance to review them. We understand that in some cases there may be results that are confusing or concerning to you. Please understand that not all results are received at the same time and often the doctors may need to interpret multiple results in order to provide you with the best plan of care or course of treatment. Therefore, we ask that you please give us  2 business days to thoroughly review all your results before contacting the office for clarification. Should we see a critical lab result, you will be contacted sooner.   If You Need Anything After Your Visit  If you have any questions or concerns for your doctor, please call our main line at [phone] and press option 4 to reach your doctor's medical assistant. If no one answers, please leave a voicemail as directed and we will return your call as soon as possible. Messages left after 4 pm will be answered the following business day.   You may also send us  a message via MyChart. We typically respond to MyChart messages within 1-2 business days.  For prescription refills, please ask your pharmacy to contact our office. Our fax number is [phone].  If  you have an urgent issue when the clinic is closed that cannot wait until the next business day, you can page your doctor at the number below.    Please note that while we do our best to be available for urgent issues outside of office hours, we are not available 24/7.   If you have an urgent issue and are unable to reach us , you may choose to seek medical care at your doctor's office, retail clinic, urgent care center, or emergency room.  If you have a medical emergency, please immediately call 911 or go to the emergency department.  Pager Numbers  - Dr. Hester: [phone]  - Dr. Jackquline: [phone]  - Dr. Claudene: [phone]   - Dr. Raymund: [phone]  In the event of inclement weather, please call our main line at [phone] for an update on the status of any delays or closures.  Dermatology Medication Tips: Please keep the boxes that topical medications come in in order to help keep track of the instructions about where and how to use these. Pharmacies typically print the medication instructions only on the boxes and not directly on the medication tubes.   If your medication is too expensive, please contact our office at [phone] option 4 or send us  a message through MyChart.   We are unable to tell what your co-pay for medications will be in advance as this is different depending on your insurance coverage. However, we may be able to find a substitute medication at lower cost or fill out paperwork to get insurance to cover  a needed medication.   If a prior authorization is required to get your medication covered by your insurance company, please allow us  1-2 business days to complete this process.  Drug prices often vary depending on where the prescription is filled and some pharmacies may offer cheaper prices.  The website www.goodrx.com contains coupons for medications through different pharmacies. The prices here do not account for what the cost may be with help  from insurance (it may be cheaper with your insurance), but the website can give you the price if you did not use any insurance.  - You can print the associated coupon and take it with your prescription to the pharmacy.  - You may also stop by our office during regular business hours and pick up a GoodRx coupon card.  - If you need your prescription sent electronically to a different pharmacy, notify our office through [facility] or by phone at [phone] option 4.     Si Usted Necesita Algo Despus de Su Visita  Tambin puede enviarnos un mensaje a travs de Clinical Cytogeneticist. Por lo general respondemos a los mensajes de MyChart en el transcurso de 1 a 2 das hbiles.  Para renovar recetas, por favor pida a su farmacia que se ponga en contacto con nuestra oficina. Randi lakes de fax es [place] [phone].  Si tiene un asunto urgente cuando la clnica est cerrada y que no puede esperar hasta el siguiente da hbil, puede llamar/localizar a su doctor(a) al nmero que aparece a continuacin.   Por favor, tenga en cuenta que aunque hacemos todo lo posible para estar disponibles para asuntos urgentes fuera del horario de [place], no estamos disponibles las 24 horas del da, los 7 [address]  [address] de la [place].   Si tiene un problema urgente y no puede comunicarse con nosotros, puede optar por buscar atencin mdica  en el consultorio de su doctor(a), en una clnica privada, en un centro de atencin urgente o en una sala de emergencias.  Si tiene engineer, drilling, por favor llame inmediatamente al 911 o vaya a la sala de emergencias.  Nmeros de bper  - Dr. Hester: [phone]  - Dra. Jackquline: [phone]  - Dr. Claudene: [phone]  - Dra. Hagen Bohorquez: [phone]  En caso de inclemencias del [place], por favor llame a nuestra lnea principal al [phone] para una actualizacin sobre el estado de cualquier retraso o cierre.  Consejos para la medicacin en dermatologa: Por favor, guarde  las cajas en las que vienen los medicamentos de uso tpico para ayudarle a seguir las instrucciones sobre dnde y cmo usarlos. Las farmacias generalmente imprimen las instrucciones del medicamento slo en las cajas y no directamente en los tubos del [place].   Si su medicamento es muy caro, por favor, pngase en contacto con landry rieger llamando al [phone] y presione la opcin 4 o envenos un mensaje a travs de Clinical Cytogeneticist.   No podemos decirle cul ser su copago por los medicamentos por adelantado ya que esto es diferente dependiendo de la cobertura de su seguro. Sin embargo, es posible que podamos encontrar un medicamento sustituto a audiological scientist un formulario para que el seguro cubra el medicamento que se considera necesario.   Si se requiere una autorizacin previa para que su compaa de seguros cubra su medicamento, por favor permtanos de 1 a 2 das hbiles para completar este proceso.  Los precios de los medicamentos varan con frecuencia dependiendo del environmental consultant de dnde se surte la receta y alguna farmacias  pueden ofrecer precios ms baratos.  El sitio web www.goodrx.com tiene cupones para medicamentos de health and safety inspector. Los precios aqu no tienen en cuenta lo que podra costar con la ayuda del seguro (puede ser ms barato con su seguro), pero el sitio web puede darle el precio si no utiliz tourist information centre manager.  - Puede imprimir el cupn correspondiente y llevarlo con su receta a la farmacia.  - Tambin puede pasar por nuestra oficina durante el horario de atencin regular y education officer, museum una tarjeta de cupones de GoodRx.  - Si necesita que su receta se enve electrnicamente a una farmacia diferente, informe a nuestra oficina a travs de MyChart de [place] o por telfono llamando al [phone] y presione la opcin 4.

## 2024-01-24 ENCOUNTER — Other Ambulatory Visit (HOSPITAL_COMMUNITY): Payer: Self-pay | Admitting: Family Medicine

## 2024-01-24 ENCOUNTER — Ambulatory Visit (HOSPITAL_COMMUNITY): Admitting: Psychiatry

## 2024-01-24 DIAGNOSIS — Z1231 Encounter for screening mammogram for malignant neoplasm of breast: Secondary | ICD-10-CM

## 2024-01-27 ENCOUNTER — Ambulatory Visit (INDEPENDENT_AMBULATORY_CARE_PROVIDER_SITE_OTHER): Payer: Self-pay | Admitting: Family Medicine

## 2024-01-27 ENCOUNTER — Encounter: Payer: Self-pay | Admitting: Family Medicine

## 2024-01-27 VITALS — BP 110/78 | HR 73 | Ht 65.0 in | Wt 184.0 lb

## 2024-01-27 DIAGNOSIS — Z794 Long term (current) use of insulin: Secondary | ICD-10-CM | POA: Diagnosis not present

## 2024-01-27 DIAGNOSIS — I10 Essential (primary) hypertension: Secondary | ICD-10-CM | POA: Diagnosis not present

## 2024-01-27 DIAGNOSIS — M791 Myalgia, unspecified site: Secondary | ICD-10-CM

## 2024-01-27 DIAGNOSIS — E785 Hyperlipidemia, unspecified: Secondary | ICD-10-CM | POA: Diagnosis not present

## 2024-01-27 DIAGNOSIS — F321 Major depressive disorder, single episode, moderate: Secondary | ICD-10-CM | POA: Diagnosis not present

## 2024-01-27 DIAGNOSIS — K219 Gastro-esophageal reflux disease without esophagitis: Secondary | ICD-10-CM | POA: Diagnosis not present

## 2024-01-27 DIAGNOSIS — E1169 Type 2 diabetes mellitus with other specified complication: Secondary | ICD-10-CM

## 2024-01-27 DIAGNOSIS — E7849 Other hyperlipidemia: Secondary | ICD-10-CM

## 2024-01-27 NOTE — Assessment & Plan Note (Signed)
 Controlled, no change in medication DASH diet and commitment to daily physical activity for a minimum of 30 minutes discussed and encouraged, as a part of hypertension management. The importance of attaining a healthy weight is also discussed.     01/27/2024    4:10 PM 01/27/2024    3:32 PM 01/02/2024    2:30 PM 11/17/2023    2:09 PM 11/09/2023   11:43 AM 11/02/2023    4:03 PM 09/14/2023    2:54 PM  BP/Weight  Systolic BP 110 145  139 122 108 --  Diastolic BP 78 70  69 80 65 --  Wt. (Lbs)  184 181.6 179 178 179.4 175  BMI  30.62 kg/m2 30.22 kg/m2 29.79 kg/m2 29.62 kg/m2 29.85 kg/m2 29.12 kg/m2

## 2024-01-27 NOTE — Assessment & Plan Note (Signed)
 Hyperlipidemia:Low fat diet discussed and encouraged.   Lipid Panel  Lab Results  Component Value Date   CHOL 125 11/07/2023   HDL 39 (L) 11/07/2023   LDLCALC 63 11/07/2023   TRIG 130 11/07/2023   CHOLHDL 3.2 11/07/2023     Updated lab needed at/ before next visit.

## 2024-01-27 NOTE — Patient Instructions (Addendum)
 F/Uin 18 weeks  Nurse visit in 1 week for flu vaccine  Pls get covid vaccine in 2 to 3 weeks  Shingles vaccines in December pls at your pharmacy  Stop pravastain for 2 weeks and see if muscle aches improve, if they do resume at three times per week  Fasting lipid, cmp and EGFr and cB 1 week before next visit  It is important that you exercise regularly at least 30 minutes 5 times a week. If you develop chest pain, have severe difficulty breathing, or feel very tired, stop exercising immediately and seek medical attention   Thanks for choosing [facility], we consider it a privelige to serve you.  Best for 2026!

## 2024-01-31 ENCOUNTER — Encounter: Payer: Self-pay | Admitting: Family Medicine

## 2024-01-31 DIAGNOSIS — E785 Hyperlipidemia, unspecified: Secondary | ICD-10-CM | POA: Insufficient documentation

## 2024-01-31 NOTE — Assessment & Plan Note (Signed)
 Controlled, no change in medication

## 2024-01-31 NOTE — Assessment & Plan Note (Signed)
 Hyperlipidemia:Low fat diet discussed and encouraged.   Lipid Panel  Lab Results  Component Value Date   CHOL 125 11/07/2023   HDL 39 (L) 11/07/2023   LDLCALC 63 11/07/2023   TRIG 130 11/07/2023   CHOLHDL 3.2 11/07/2023     Updated lab needed at/ before next visit.

## 2024-01-31 NOTE — Progress Notes (Signed)
 Jennifer Blackburn     MRN: [id number]      DOB: 1945-08-28  Chief Complaint  Patient presents with   Extremity Pain    Having pains in arm,legs and feet    HPI Jennifer Blackburn is here for follow up and re-evaluation of chronic medical conditions, medication management and review of any available recent lab and radiology data.  Preventive health is updated, specifically  Cancer screening and Immunization.   Questions or concerns regarding consultations or procedures which the PT has had in the interim are  addressed. The PT denies any adverse reactions to current medications since the last visit.  Denies polyuria, polydipsia, blurred vision , or hypoglycemic episodes. Reports improved blood sugar values C/o generalized muscle aches in arms and legs , wants to hold off on flu vaccine, no fever , but unsure if she is getting ill  ROS Denies recent fever or chills. Denies sinus pressure, nasal congestion, ear pain or sore throat. Denies chest congestion, productive cough or wheezing. Denies chest pains, palpitations and leg swelling Denies abdominal pain, nausea, vomiting,diarrhea or constipation.   Denies dysuria, frequency, hesitancy or incontinence. Denies uncontrolled  joint pain, swelling and limitation in mobility. Denies headaches, seizures, numbness, or tingling. Denies depression, uncontrolled anxiety or insomnia. Denies skin break down or rash.   PE  BP 110/78   Pulse 73   Ht 5' 5 (1.651 m)   Wt 184 lb (83.5 kg)   LMP 11/11/2016   SpO2 98%   BMI 30.62 kg/m   Patient alert and oriented and in no cardiopulmonary distress.  HEENT: No facial asymmetry, EOMI,     Neck supple .  Chest: Clear to auscultation bilaterally.  CVS: S1, S2 no murmurs, no S3.Regular rate.  ABD: Soft non tender.   Ext: No edema  MS: Adequate ROM spine, shoulders, hips and knees.  Skin: Intact, no ulcerations or rash noted.  Psych: Good eye contact, normal affect. Memory intact not anxious  or depressed appearing.  CNS: CN 2-12 intact, power,  normal throughout.no focal deficits noted.   Assessment & Plan Hyperlipidemia Hyperlipidemia:Low fat diet discussed and encouraged.   Lipid Panel  Lab Results  Component Value Date   CHOL 125 11/07/2023   HDL 39 (L) 11/07/2023   LDLCALC 63 11/07/2023   TRIG 130 11/07/2023   CHOLHDL 3.2 11/07/2023     Updated lab needed at/ before next visit.   Primary hypertension Controlled, no change in medication DASH diet and commitment to daily physical activity for a minimum of 30 minutes discussed and encouraged, as a part of hypertension management. The importance of attaining a healthy weight is also discussed.     01/27/2024    4:10 PM 01/27/2024    3:32 PM 01/02/2024    2:30 PM 11/17/2023    2:09 PM 11/09/2023   11:43 AM 11/02/2023    4:03 PM 09/14/2023    2:54 PM  BP/Weight  Systolic BP 110 145  139 122 108 --  Diastolic BP 78 70  69 80 65 --  Wt. (Lbs)  184 181.6 179 178 179.4 175  BMI  30.62 kg/m2 30.22 kg/m2 29.79 kg/m2 29.62 kg/m2 29.85 kg/m2 29.12 kg/m2       Hyperlipidemia LDL goal <100 Hyperlipidemia:Low fat diet discussed and encouraged.   Lipid Panel  Lab Results  Component Value Date   CHOL 125 11/07/2023   HDL 39 (L) 11/07/2023   LDLCALC 63 11/07/2023   TRIG 130 11/07/2023   CHOLHDL  3.2 11/07/2023     Updated lab needed at/ before next visit.   Type 2 diabetes mellitus with other specified complication (HCC) Diabetes associated with hypertension and hyperlipidemia  Jennifer Blackburn is reminded of the importance of commitment to daily physical activity for 30 minutes or more, as able and the need to limit carbohydrate intake to 30 to 60 grams per meal to help with blood sugar control.   The need to take medication as prescribed, test blood sugar as directed, and to call between visits if there is a concern that blood sugar is uncontrolled is also discussed.   Jennifer Blackburn is reminded of the  importance of daily foot exam, annual eye examination, and good blood sugar, blood pressure and cholesterol control.     Latest Ref Rng & Units 11/17/2023    3:35 PM 11/07/2023   10:47 AM 06/30/2023   11:52 AM 06/30/2023   11:25 AM 05/04/2023   12:31 PM  Diabetic Labs  HbA1c 4.0 - 5.6 %    8.8    Microalbumin mg/dL   1.1     Micro/Creat Ratio <30 mg/g creat   12     Chol 100 - 199 mg/dL  874      HDL >60 mg/dL  39      Calc LDL 0 - 99 mg/dL  63      Triglycerides 0 - 149 mg/dL  869      Creatinine 9.42 - 1.00 mg/dL 9.16  9.07    9.14       01/27/2024    4:10 PM 01/27/2024    3:32 PM 01/02/2024    2:30 PM 11/17/2023    2:09 PM 11/09/2023   11:43 AM 11/02/2023    4:03 PM 09/14/2023    2:54 PM  BP/Weight  Systolic BP 110 145  139 122 108 --  Diastolic BP 78 70  69 80 65 --  Wt. (Lbs)  184 181.6 179 178 179.4 175  BMI  30.62 kg/m2 30.22 kg/m2 29.79 kg/m2 29.62 kg/m2 29.85 kg/m2 29.12 kg/m2      11/09/2023   11:20 AM 10/11/2022    2:00 PM  Foot/eye exam completion dates  Foot Form Completion Done Done    Managed by Endo and reports improved control    GERD (gastroesophageal reflux disease) Controlled, no change in medication   Depression, major, single episode, moderate (HCC) Controlled, no change in medication   Myalgia Recommend holding statin for 2 weeks to see if pain resolves and let me know

## 2024-01-31 NOTE — Assessment & Plan Note (Signed)
 Recommend holding statin for 2 weeks to see if pain resolves and let me know

## 2024-01-31 NOTE — Assessment & Plan Note (Addendum)
 Diabetes associated with hypertension and hyperlipidemia  [name] is reminded of the importance of commitment to daily physical activity for 30 minutes or more, as able and the need to limit carbohydrate intake to 30 to 60 grams per meal to help with blood sugar control.   The need to take medication as prescribed, test blood sugar as directed, and to call between visits if there is a concern that blood sugar is uncontrolled is also discussed.   [name] is reminded of the importance of daily foot exam, annual eye examination, and good blood sugar, blood pressure and cholesterol control.     Latest Ref Rng & Units 11/17/2023    3:35 PM 11/07/2023   10:47 AM 06/30/2023   11:52 AM 06/30/2023   11:25 AM 05/04/2023   12:31 PM  Diabetic Labs  HbA1c 4.0 - 5.6 %    8.8    Microalbumin mg/dL   1.1     Micro/Creat Ratio <30 mg/g creat   12     Chol 100 - 199 mg/dL  874      HDL >60 mg/dL  39      Calc LDL 0 - 99 mg/dL  63      Triglycerides 0 - 149 mg/dL  869      Creatinine 9.42 - 1.00 mg/dL 9.16  9.07    9.14       01/27/2024    4:10 PM 01/27/2024    3:32 PM 01/02/2024    2:30 PM 11/17/2023    2:09 PM 11/09/2023   11:43 AM 11/02/2023    4:03 PM 09/14/2023    2:54 PM  BP/Weight  Systolic BP 110 145  139 122 108 --  Diastolic BP 78 70  69 80 65 --  Wt. (Lbs)  184 181.6 179 178 179.4 175  BMI  30.62 kg/m2 30.22 kg/m2 29.79 kg/m2 29.62 kg/m2 29.85 kg/m2 29.12 kg/m2      11/09/2023   11:20 AM 10/11/2022    2:00 PM  Foot/eye exam completion dates  Foot Form Completion Done Done    Managed by Endo and reports improved control

## 2024-02-03 ENCOUNTER — Ambulatory Visit

## 2024-02-07 ENCOUNTER — Other Ambulatory Visit

## 2024-02-07 ENCOUNTER — Ambulatory Visit: Admitting: Internal Medicine

## 2024-02-07 ENCOUNTER — Encounter: Payer: Self-pay | Admitting: Internal Medicine

## 2024-02-07 VITALS — BP 122/70 | HR 72 | Ht 65.0 in | Wt 180.2 lb

## 2024-02-07 DIAGNOSIS — E1159 Type 2 diabetes mellitus with other circulatory complications: Secondary | ICD-10-CM | POA: Diagnosis not present

## 2024-02-07 DIAGNOSIS — E890 Postprocedural hypothyroidism: Secondary | ICD-10-CM

## 2024-02-07 DIAGNOSIS — Z7984 Long term (current) use of oral hypoglycemic drugs: Secondary | ICD-10-CM

## 2024-02-07 DIAGNOSIS — C73 Malignant neoplasm of thyroid gland: Secondary | ICD-10-CM

## 2024-02-07 DIAGNOSIS — Z794 Long term (current) use of insulin: Secondary | ICD-10-CM | POA: Diagnosis not present

## 2024-02-07 DIAGNOSIS — E1165 Type 2 diabetes mellitus with hyperglycemia: Secondary | ICD-10-CM | POA: Diagnosis not present

## 2024-02-07 DIAGNOSIS — Z7985 Long-term (current) use of injectable non-insulin antidiabetic drugs: Secondary | ICD-10-CM

## 2024-02-07 LAB — POCT GLYCOSYLATED HEMOGLOBIN (HGB A1C): Hemoglobin A1C: 8.3 % — AB (ref 4.0–5.6)

## 2024-02-07 NOTE — Progress Notes (Addendum)
 Patient ID: TORRE PIKUS, female   DOB: 02-Feb-1946, 78 y.o.   MRN: [id number]   HPI: Jennifer Blackburn is a 78 y.o.-year-old female, returning for follow-up for DM2, dx in 2001, insulin -dependent since 2017, uncontrolled, with long-term complications (DR, CAD) and also papillary thyroid  cancer and postsurgical hypothyroidism.  Last visit 3 months ago.  Interim history: No increased urination, blurry vision, chest pain. She is in PT after she fell and hurt shoulder and knee.   DM2: Reviewed HbA1c levels: 11/09/2023: HbA1c 8.9% Lab Results  Component Value Date   HGBA1C 8.8 (A) 06/30/2023   HGBA1C 9.5 (A) 03/03/2023   HGBA1C 11.2 10/11/2022   HGBA1C 9.4 (A) 06/08/2022   HGBA1C 9.1 (A) 12/31/2021   HGBA1C 8.8 (A) 08/27/2021   HGBA1C 8.8 (A) 04/28/2021   HGBA1C 10.1 (A) 12/23/2020   HGBA1C 9.0 (A) 08/07/2020   HGBA1C 10.5 (A) 05/09/2020  01/12/2018: HbA1c 8.7% 08/05/2017: HbA1c 8.9% 07/11/2017: HbA1c 9.1%  Pt is on a regimen of: - Metformin  1000 mg 2x a day with meals. - Toujeo  30 units in a.m. and 50 units at bedtime >> .SABRASABRA30 units 2x a day >> 60 units in a.m. - NovoLog  ... not consistently >> 20-25 >> 18-20 >> 20-25 >> 18-20 units before meals - Ozempic  0.5 ...>> 2 >> 1-2 mg weekly (through PAP) She was on Ozempic  0.5 mg weekly in a.m. >> 1 mg  - constipation >> stopped 06/2018.  We added it back 09/2018. She was on Victoza >> $$$. She was on Jardiance >> $$$. She was on Actos >> swelling - stopped Spring 2019. She was on Glipizide  XL 5 mg daily >> stopped 07/2017.  We retried glipizide  5 mg twice a day but had to stop 12/2018 due to lack of effect.  Pt checks her sugars >4x times a day:  Previously:  Previously:   Lowest sugar was 58 >> 100 >> 59; she has hypoglycemia awareness in the 60s. Highest sugar was 300s >> 300s >> 277  Glucometer: AccuChek Aviva  Pt's meals are: - Breakfast:toast, cereal, milk; oatmeal; bacon + egg >> oatmeal + coffee >> cereals - Lunch:  skips >> 2 eggs, fruit - Dinner: hamburger, or chicken, or fish, + veggies, bread, water  and tea - Snacks: 2- fruit She was seeing a dietitian in [place].  -No CKD, last BUN/creatinine:  Lab Results  Component Value Date   BUN 11 11/17/2023   BUN 13 11/07/2023   CREATININE 0.83 11/17/2023   CREATININE 0.92 11/07/2023   Lab Results  Component Value Date   MICRALBCREAT 12 06/30/2023   MICRALBCREAT 11 06/11/2022   MICRALBCREAT 12 04/21/2021   MICRALBCREAT 13 01/28/2021   MICRALBCREAT 11.5 06/14/2017   MICRALBCREAT 20.8 07/10/2014   MICRALBCREAT 10.4 05/02/2013   MICRALBCREAT 5.3 04/06/2012   MICRALBCREAT 3.7 12/21/2010   MICRALBCREAT 29.8 12/13/2009  On quinapril .  -+ HL; last set of lipids: Lab Results  Component Value Date   CHOL 125 11/07/2023   HDL 39 (L) 11/07/2023   LDLCALC 63 11/07/2023   TRIG 130 11/07/2023   CHOLHDL 3.2 11/07/2023  On pravastatin  80.  - last eye exam was 09/2023: + DR reportedly. Dr. Franchot in [place].  She has cataracts OU.   -No numbness and tingling in her feet. Has a podiatrist in [place].  Last foot exam 11/09/2023.  On ASA 81.  Pt has no FH of DM.  H/o Thyroid  cancer (2001), s/p RAI treatment, now with postsurgical hypothyroidism.  Previously on brand-name Synthroid , now  generic levothyroxine .  Pt is on levothyroxine  112 mcg daily (dose decreased 02/2023), taken: - in am - fasting - at least 30 min from b'fast - no calcium  - no iron - no multivitamins - + PPIs (Protonix ) moved to pm - not on Biotin  Reviewed her TFTs: Lab Results  Component Value Date   TSH 0.84 06/30/2023   TSH 0.10 (L) 03/03/2023   TSH 0.63 10/11/2022   TSH 0.72 12/31/2021   TSH 0.06 (L) 08/27/2021   TSH 0.40 08/07/2020   TSH 1.60 05/09/2020   TSH 0.05 (L) 08/31/2019   TSH 0.04 (L) 12/28/2018   TSH 0.06 (L) 09/21/2018   TSH 0.61 05/26/2018   TSH 2.49 03/23/2016   TSH 0.070 (L) 10/13/2010   TSH 0.035 (L) 09/22/2006   TSH 0.015 (L)  06/17/2006  11/03/2017: TSH 0.044  Her thyroglobulin levels are detectable: Lab Results  Component Value Date   THYROGLB 0.3 (L) 03/03/2023   THYROGLB 0.5 (L) 10/11/2022   THYROGLB 0.3 (L) 08/27/2021   THYROGLB 0.4 (L) 08/07/2020   THYROGLB 1.2 (L) 05/09/2020   THYROGLB 0.4 (L) 08/31/2019   THYROGLB 0.3 (L) 09/21/2018   THYROGLB 0.7 (L) 05/26/2018   Lab Results  Component Value Date   THGAB <1 03/03/2023   THGAB <1 10/11/2022   THGAB <1 08/27/2021   THGAB <1 08/07/2020   THGAB <1 05/09/2020   THGAB <1 08/31/2019   THGAB <1 09/21/2018   THGAB <1 05/26/2018   Reviewed records from Dr. Christi: Patient is status post total thyroidectomy in 2004 1.3 cm papillary thyroid  cancer of the right lobe, followed by 3x RAI tx's with  33 mCi I-131 and a fourth RAI dose inpatient: 125 mCi, at [facility].    Whole-body scan on 09/08/2007 showed an increase in the thyroid  bed activity and the follow up study with PET scan 10/08/2007 showed mildly hypermetabolic cervical lymph nodes.    Repeat PET 05/10/2008 showed no significant changes felt likely to be due to to a reactive process.  11/03/2017: TSH 0.044, thyroglobulin 0.2, ATA <1.0  04/05/2017: TSH 0.021 (0.45-4.5), thyroglobulin 0.3 (by IMA), ATA <1.0  Reviewed imaging test reports in the system: Thyroid  ultrasound (06/10/2018): 1.1 cm nonspecific calcified lesion in the right upper neck.  CT was recommended: There is no residual or recurrent tissue in the right or left thyroid  beds. There is no evidence of abnormal adenopathy by short axis diameter measurement criteria There is a nonspecific calcified soft tissue mass measuring 1.1 cm in the right superior neck. This is of unknown significance. CT neck can be performed to further characterize.  CT (10/18/2018): Status post thyroidectomy. No soft tissue mass within the thyroidectomy bed. A 13 x 7 mm ovoid focus of calcification in the right aspect of the thyroidectomy bed likely corresponding  with the finding on recent neck ultrasound and is unchanged as compared to neck CT 10/24/2007, benign.   No pathologically enlarged cervical chain lymph nodes. A nonenlarged calcified right supraclavicular lymph node is new from prior neck CT 10/24/2007 but also favored benign. Attention recommended on follow-up.  Neck U/S (06/03/2020): Ultrasound of the neck demonstrates no evidence of visible residual thyroid  tissue or abnormal soft tissue mass.   Ovoid area of densely shadowing calcification again noted to the right of midline as seen by prior ultrasound and also demonstrated by prior CT studies of the neck with documented stability between 2009 and 2020 studies.   Small left cervical lymph node has a similar appearance to the  prior ultrasound study measuring 0.5 cm in short axis.   IMPRESSION: No evidence by ultrasound of recurrent thyroid  malignancy in the neck.  Stable right neck calcification which has been documented to be benign and stable by prior CT.   Small left cervical lymph node demonstrates stable appearance and size by ultrasound  compared to the 2020 ultrasound.  Pt denies: - feeling nodules in neck - hoarseness - dysphagia - choking  She also has a history of HTN and anemia. She had an episode of gastroenteritis 05/28/2022. scan: diverticulitis, GB stones and kidney stones.  Lipase was normal.  ROS: + See HPI  I reviewed pt's medications, allergies, PMH, social hx, family hx, and changes were documented in the history of present illness. Otherwise, unchanged from my initial visit note.  Past Medical History:  Diagnosis Date   Anxiety    Arthritis    CAD (coronary artery disease)    Depression    Diabetes mellitus type II    without complication   DJD (degenerative joint disease) of lumbar spine    Dysphagia 11/11/2018   Hypercholesteremia    Hyperlipidemia    Hypertension    benign    Hypothyroidism    Thyroid  cancer (HCC) 2001   Past Surgical  History:  Procedure Laterality Date   ABDOMINAL HYSTERECTOMY     CARDIAC CATHETERIZATION     CATARACT EXTRACTION W/PHACO Right 07/17/2012   Procedure: CATARACT EXTRACTION PHACO AND INTRAOCULAR LENS PLACEMENT (IOC);  Surgeon: Cherene Mania, MD;  Location: [facility];  Service: Ophthalmology;  Laterality: Right;  CDE:25.51   CATARACT EXTRACTION W/PHACO Left 09/17/2016   Procedure: CATARACT EXTRACTION PHACO AND INTRAOCULAR LENS PLACEMENT LEFT EYE;  Surgeon: Mania Cherene, MD;  Location: [facility];  Service: Ophthalmology;  Laterality: Left;  CDE: 19.23   COLONOSCOPY     COLONOSCOPY N/A 01/15/2014   Procedure: COLONOSCOPY;  Surgeon: Lamar CHRISTELLA Hollingshead, MD;  Location: [facility];  Service: Endoscopy;  Laterality: N/A;  9:00 AM   COLONOSCOPY WITH PROPOFOL  N/A 01/25/2019   Procedure: COLONOSCOPY WITH PROPOFOL ;  Surgeon: Hollingshead Lamar CHRISTELLA, MD; diverticulosis in the entire colon, otherwise normal exam.  No repeat colonoscopy due to age.   DOPPLER ECHOCARDIOGRAPHY     ESOPHAGOGASTRODUODENOSCOPY (EGD) WITH PROPOFOL  N/A 01/25/2019   Procedure: ESOPHAGOGASTRODUODENOSCOPY (EGD) WITH PROPOFOL ;  Surgeon: Hollingshead Lamar CHRISTELLA, MD; normal esophagus without dilation due to inability to pass dilator beyond the hypopharynx, 1 small nonbleeding gastric ulcer s/p biopsied, otherwise normal exam.  Pathology with ulcer with reactive changes, no H. pylori, metaplasia, dysplasia, or malignancy.   ESOPHAGOGASTRODUODENOSCOPY (EGD) WITH PROPOFOL  N/A 08/21/2019   Procedure: ESOPHAGOGASTRODUODENOSCOPY (EGD) WITH PROPOFOL ;  Surgeon: Hollingshead Lamar CHRISTELLA, MD; Normal esophagus. Previously noted gastric ulcer completely healed. Normal examined duodenum.    JOINT REPLACEMENT  07/01/2010   left hip   KNEE SURGERY Right    arthroscopy   left hip replaced  07/01/2010   SPINE SURGERY  2006   cervical   stress dipyridamole myocardial perfusion     THYROIDECTOMY     TONSILLECTOMY     VESICOVAGINAL FISTULA CLOSURE W/ TAH     Social History   Socioeconomic  History   Marital status: Married    Spouse name: Not on file   Number of children: 2   Years of education: 14   Highest education level: Some college, no degree  Occupational History   Occupation: retired     Comment: bank  Tobacco Use   Smoking status: Never  Passive exposure: Yes   Smokeless tobacco: Never   Tobacco comments:    Husband smokes in the home  Vaping Use   Vaping status: Never Used  Substance and Sexual Activity   Alcohol use: No   Drug use: No   Sexual activity: Not Currently  Other Topics Concern   Not on file  Social History Narrative   Lives with husband, at home   No caffeine   Social Drivers of Corporate Investment Banker Strain: Low Risk  (09/14/2023)   Overall Financial Resource Strain (CARDIA)    Difficulty of Paying Living Expenses: Not hard at all  Food Insecurity: No Food Insecurity (09/14/2023)   Hunger Vital Sign    Worried About Running Out of Food in the Last Year: Never true    Ran Out of Food in the Last Year: Never true  Transportation Needs: No Transportation Needs (09/14/2023)   PRAPARE - Administrator, Civil Service (Medical): No    Lack of Transportation (Non-Medical): No  Physical Activity: Insufficiently Active (09/14/2023)   Exercise Vital Sign    Days of Exercise per Week: 4 days    Minutes of Exercise per Session: 30 min  Stress: No Stress Concern Present (09/14/2023)   [employer] of Occupational Health - Occupational Stress Questionnaire    Feeling of Stress: Not at all  Social Connections: Socially Integrated (09/14/2023)   Social Connection and Isolation Panel    Frequency of Communication with Friends and Family: More than three times a week    Frequency of Social Gatherings with Friends and Family: Twice a week    Attends Religious Services: More than 4 times per year    Active Member of [employer] or Organizations: Yes    Attends Engineer, Structural: More than 4 times per year    Marital  Status: Married  Catering Manager Violence: Not At Risk (09/14/2023)   Humiliation, Afraid, Rape, and Kick questionnaire    Fear of Current or Ex-Partner: No    Emotionally Abused: No    Physically Abused: No    Sexually Abused: No   Current Outpatient Medications  Medication Sig Dispense Refill   ACCU-CHEK GUIDE test strip USE AS INSTRUCTED TO CHECK BLOOD SUGAR 2 TIMES PER DAY DX CODE E11.65 100 strip 3   Cetirizine  HCl 10 MG TBDP Take one tablet by mouth two times daily for allergy 60 tablet 5   Cholecalciferol (VITAMIN D3) 50 MCG (2000 UT) TABS Take 2,000 Units by mouth daily.     Eflornithine HCl (VANIQA ) 13.9 % cream Apply topically daily to the chin 45 g 1   ezetimibe  (ZETIA ) 10 MG tablet TAKE 1 TABLET BY MOUTH EVERY DAY 90 tablet 1   gabapentin  (NEURONTIN ) 100 MG capsule TAKE ONE CAPSULE TWICE DAILY AND THREE AT BEDTIME 150 capsule 1   hydroquinone  4 % cream Apply topically 2 (two) times daily. 28.35 g 0   hydrOXYzine  (ATARAX ) 10 MG tablet Take one tablet twice daily and two tablets at bedtime as needed for generalized itching. 360 tablet 1   imipramine  (TOFRANIL ) 50 MG tablet TAKE 1 TABLET AT BEDTIME 90 tablet 3   insulin  aspart (NOVOLOG  FLEXPEN) 100 UNIT/ML FlexPen Inject 20-25 Units into the skin 3 (three) times daily with meals.     insulin  glargine, 1 Unit Dial , (TOUJEO  SOLOSTAR) 300 UNIT/ML Solostar Pen INJECT 60 UNITS under skin  DAILY     Insulin  Pen Needle 32G X 4 MM MISC Use 4x a  day 300 each 3   isosorbide  mononitrate (IMDUR ) 30 MG 24 hr tablet TAKE 1/2 TABLET BY MOUTH DAILY 45 tablet 3   Lancets (ACCU-CHEK MULTICLIX) lancets Use as instructed three times daily dx 250.01 100 each 5   levothyroxine  (SYNTHROID ) 112 MCG tablet TAKE 1 TABLET BY MOUTH EVERY DAY BEFORE BREAKFAST 90 tablet 3   metFORMIN  (GLUCOPHAGE ) 1000 MG tablet Take 1 tablet (1,000 mg total) by mouth 2 (two) times daily. 180 tablet 3   metoprolol  tartrate (LOPRESSOR ) 50 MG tablet TAKE 1 TABLET BY MOUTH TWICE A  DAY 180 tablet 3   pantoprazole  (PROTONIX ) 40 MG tablet TAKE 1 TABLET BY MOUTH EVERY DAY 90 tablet 3   PARoxetine  (PAXIL ) 20 MG tablet TAKE 1 TABLET EVERY DAY 90 tablet 3   polyethylene glycol powder (GLYCOLAX /MIRALAX ) 17 GM/SCOOP powder Take 1 Container by mouth daily as needed.     pravastatin  (PRAVACHOL ) 80 MG tablet TAKE 1 TABLET EVERY DAY 90 tablet 3   Semaglutide , 2 MG/DOSE, 8 MG/3ML SOPN Inject 2 mg as directed once a week. (Patient taking differently: Inject 1 mg as directed once a week.) 9 mL 2   No current facility-administered medications for this visit.     Allergies  Allergen Reactions   Lipitor [Atorvastatin  Calcium ] Other (See Comments)    Muscle aches   Actos [Pioglitazone] Other (See Comments)    Peripheral edema   Crestor  [Rosuvastatin ] Other (See Comments)    Muscle aches   Daypro [Oxaprozin] Hives   Nsaids Hives   Sulfonamide Derivatives Hives   Family History  Problem Relation Age of Onset   Heart attack Father    Heart failure Mother    Asthma Daughter    Sleep apnea Son        CPAP   Colon cancer Neg Hx    PE: BP 122/70   Pulse 72   Ht 5' 5 (1.651 m)   Wt 180 lb 3.2 oz (81.7 kg)   LMP 11/11/2016   SpO2 96%   BMI 29.99 kg/m  Wt Readings from Last 3 Encounters:  02/07/24 180 lb 3.2 oz (81.7 kg)  01/27/24 184 lb (83.5 kg)  01/02/24 181 lb 9.6 oz (82.4 kg)   Constitutional: overweight, in NAD Eyes: no exophthalmos ENT: no neck masses palpated, no cervical lymphadenopathy Cardiovascular: RRR, No MRG Respiratory: CTA B Musculoskeletal: no deformities Skin:  no rashes Neurological: no tremor with outstretched hands  ASSESSMENT: 1. DM2, insulin -dependent, uncontrolled, with long-term complications - CAD - DR  2.  Papillary thyroid  cancer  3.  Postsurgical hypothyroidism  PLAN:  1. Patient with longstanding, uncontrolled, type 2 diabetes, on metformin , basal-bolus insulin  regimen along with weekly GLP-1 receptor agonist, with still poor  control.  At last visit, HbA1c was higher, at 8.9%.  At that time, sugars were still improving overnight but not quite to target but they were increasing after breakfast and then again after every meal.  She was having dietary indiscretions and also only using the 1 mg dose of Ozempic  as she previously could not tolerate the higher dose.  She was taking the NovoLog  as recommended, 15 minutes before meals.  I advised her to move Toujeo  in 1 single dose in the morning.  I did suggest to start exercise to improve her insulin  resistance and discussed about the need to improve diet.  I also recommended to try to rotate her injection sites more, using the sides of the abdomen, if possible. CGM interpretation: -At today's visit, we reviewed  her CGM downloads: It appears that 74% of values are in target range (goal >70%), while 26% are higher than 180 (goal <25%), and 0% are lower than 70 (goal <4%).  The calculated average blood sugar is 155.  The projected HbA1c for the next 3 months (GMI) is 7.0%. -Reviewing the CGM trends, sugars appear to be much improved in the last 2 weeks, with only occasional hyperglycemic peaks after lunch and dinner.  I believe that these are due to occasionally taking her NovoLog  after she eats, but she is trying to take it before.  We discussed about taking the insulin  15 minutes before each meal, but otherwise I would not change her regimen for now.  We may need to reduce her Toujeo  dose at next visit. - I suggested to:  Patient Instructions  Please continue: - Metformin  1000 mg 2x a day with meals. - Ozempic  1-2 mg weekly - Toujeo  60 units in am - Novolog  18-20 units 15 min before each meal   Please continue levothyroxine  112 mcg daily.  Take the thyroid  hormone every day, with water , at least 30 minutes before breakfast, separated by at least 4 hours from: - acid reflux medications - calcium  - iron - multivitamins   Please stop at the lab.  Please return in 3  months.  - we checked her HbA1c: 8.3% (lower, but higher than expected from her CGM) - advised to check sugars at different times of the day - 4x a day, rotating check times - advised for yearly eye exams >> she is UTD - return to clinic in 3 months  2.  Papillary thyroid  cancer -Previous thyroid  cancer records were reviewed from Dr. Christi -She has metastatic thyroid  cancer and had 4 RAI treatments.  She also had increased signal on the PET scan from 2009, however in 2010, another PET scan showed only possible inflammatory lymph nodes in the area -The latest thyroid  ultrasound from 05/2020 showed no concerning masses.  Previous ultrasound showed a specific calcified region in the right upper neck in the 2019 neck CT showed that the calcified mass was stable and most likely benign - Latest thyroglobulin level from 02/2023 was 0.3, slightly lower than before but overall not much changed over the last 5 years - Will recheck the thyroglobulin and thyroglobulin antibodies now - if higher, plan to check a new U/S  3.  Postsurgical hypothyroidism - latest thyroid  labs reviewed with pt. >> normal: Lab Results  Component Value Date   TSH 0.84 06/30/2023  - she continues on LT4 112 mcg daily - pt feels good on this dose. - we discussed about taking the thyroid  hormone every day, with water , >30 minutes before breakfast, separated by >4 hours from acid reflux medications, calcium , iron, multivitamins. Pt. is taking it correctly. - will check thyroid  tests today: TSH and fT4 - If labs are abnormal, she will need to return for repeat TFTs in 1.5 months  Needs refills LT4.  Component     Latest Ref Rng 02/07/2024  TSH     0.40 - 4.50 mIU/L 1.75   T4,Free(Direct)     0.8 - 1.8 ng/dL 1.2   Thyroglobulin Ab     < or = 1 IU/mL <1   Thyroglobulin     ng/mL 0.9 (L)   Comment -   Thyroglobulin level is elevated, higher than before.  We will try to increase her dose of LT4 to 125 mcg daily and  recheck the TFTs and thyroglobulin along  with antibodies in 1.5 months.  Lela Fendt, MD PhD [facility] Endocrinology

## 2024-02-07 NOTE — Patient Instructions (Addendum)
 Please continue: - Metformin  1000 mg 2x a day with meals. - Ozempic  1-2 mg weekly - Toujeo  60 units in am - Novolog  18-20 units 15 min before each meal   Please continue levothyroxine  112 mcg daily.  Take the thyroid  hormone every day, with water , at least 30 minutes before breakfast, separated by at least 4 hours from: - acid reflux medications - calcium  - iron - multivitamins   Please stop at the lab.  Please return in 3 months.

## 2024-02-08 NOTE — Addendum Note (Signed)
 Addended by: CLEOTILDE ROLIN RAMAN on: 02/08/2024 01:31 PM   Modules accepted: Orders

## 2024-02-09 ENCOUNTER — Telehealth: Payer: Self-pay | Admitting: Pharmacist

## 2024-02-09 ENCOUNTER — Encounter: Payer: Self-pay | Admitting: Nutrition

## 2024-02-09 DIAGNOSIS — E1169 Type 2 diabetes mellitus with other specified complication: Secondary | ICD-10-CM

## 2024-02-09 LAB — THYROGLOBULIN LEVEL: Thyroglobulin: 0.9 ng/mL — ABNORMAL LOW

## 2024-02-09 LAB — T4, FREE: Free T4: 1.2 ng/dL (ref 0.8–1.8)

## 2024-02-09 LAB — TSH: TSH: 1.75 m[IU]/L (ref 0.40–4.50)

## 2024-02-09 LAB — THYROGLOBULIN ANTIBODY: Thyroglobulin Ab: <1 [IU]/mL (ref <=1)

## 2024-02-09 MED ORDER — FREESTYLE LIBRE 3 PLUS SENSOR MISC: Status: AC

## 2024-02-09 NOTE — Telephone Encounter (Signed)
 Updated libre order and progress notes sent to [facility] mail order Orders uploaded to [facility] portal  Patient to continue on current therapy Follows with endo  Mliss Tarry Griffin, PharmD, BCACP, CPP Clinical Pharmacist, [facility]

## 2024-02-10 ENCOUNTER — Ambulatory Visit: Payer: Self-pay | Admitting: Internal Medicine

## 2024-02-10 MED ORDER — LEVOTHYROXINE SODIUM 125 MCG PO TABS: ORAL_TABLET | ORAL | 3 refills | Status: AC

## 2024-02-10 NOTE — Addendum Note (Signed)
 Addended by: TRIXIE FILE on: 02/10/2024 01:03 PM   Modules accepted: Orders

## 2024-02-13 ENCOUNTER — Encounter: Payer: Self-pay | Admitting: Nutrition

## 2024-02-13 ENCOUNTER — Encounter: Attending: Family Medicine | Admitting: Nutrition

## 2024-02-13 VITALS — Ht 65.0 in | Wt 180.0 lb

## 2024-02-13 DIAGNOSIS — E1159 Type 2 diabetes mellitus with other circulatory complications: Secondary | ICD-10-CM | POA: Insufficient documentation

## 2024-02-13 DIAGNOSIS — I2510 Atherosclerotic heart disease of native coronary artery without angina pectoris: Secondary | ICD-10-CM | POA: Diagnosis not present

## 2024-02-13 NOTE — Patient Instructions (Signed)
 Continue to cut out sweets and processed foods Increase whole plant based foods Exercise as tolerated 30 minutes a day Keep taking insulin  as prescribed. Get A1C down to 7% Don't gain any weight over the holidays

## 2024-02-13 NOTE — Progress Notes (Signed)
 Medical Nutrition Therapy  Appointment Start time:  1500  Appointment End time:  1530 Primary concerns today: DM Typ3 2  Referral diagnosis: E11.8 Preferred learning style: No Preference  Learning readiness: Ready   NUTRITION ASSESSMENT Dm Follow up   Has had a lot of social engagements to eat out.  Libre    77% TIR GMI 7% 22% high, 1% Very high. 60 units in Am of Toujeo  and Novolog  18 units with meal and sliding scale. She has been better about taking her insulin  with her to use with meals when eating away from home. Trying to make better choices. Sees Dr. Trixie , Endocrinology Has some oral surgery and has been on antibiotics. Having to eat some soups and softer foods. Not exercising much. Trying to drink more water . Wt Readings from Last 3 Encounters:  02/13/24 180 lb (81.6 kg)  02/07/24 180 lb 3.2 oz (81.7 kg)  01/27/24 184 lb (83.5 kg)   Ht Readings from Last 3 Encounters:  02/13/24 5' 5 (1.651 m)  02/07/24 5' 5 (1.651 m)  01/27/24 5' 5 (1.651 m)   Body mass index is 29.95 kg/m. @[facility]IFA @ Facility age limit for growth %iles is 20 years. Facility age limit for growth %iles is 20 years.     Clinical Medical Hx:  Past Medical History:  Diagnosis Date   Anxiety    Arthritis    CAD (coronary artery disease)    Depression    Diabetes mellitus type II    without complication   DJD (degenerative joint disease) of lumbar spine    Dysphagia 11/11/2018   Hypercholesteremia    Hyperlipidemia    Hypertension    benign    Hypothyroidism    Thyroid  cancer (HCC) 2001    Medications:  Current Outpatient Medications on File Prior to Visit  Medication Sig Dispense Refill   ACCU-CHEK GUIDE test strip USE AS INSTRUCTED TO CHECK BLOOD SUGAR 2 TIMES PER DAY DX CODE E11.65 100 strip 3   Cetirizine  HCl 10 MG TBDP Take one tablet by mouth two times daily for allergy 60 tablet 5   Cholecalciferol (VITAMIN D3) 50 MCG (2000 UT) TABS Take 2,000 Units by mouth daily.      Continuous Glucose Sensor (FREESTYLE LIBRE 3 PLUS SENSOR) MISC Change sensor every 15 days. DX: E11.9 gets via [employer] [facility] [employer] mail order     Eflornithine HCl (VANIQA ) 13.9 % cream Apply topically daily to the chin 45 g 1   ezetimibe  (ZETIA ) 10 MG tablet TAKE 1 TABLET BY MOUTH EVERY DAY 90 tablet 1   gabapentin  (NEURONTIN ) 100 MG capsule TAKE ONE CAPSULE TWICE DAILY AND THREE AT BEDTIME 150 capsule 1   hydroquinone  4 % cream Apply topically 2 (two) times daily. 28.35 g 0   hydrOXYzine  (ATARAX ) 10 MG tablet Take one tablet twice daily and two tablets at bedtime as needed for generalized itching. 360 tablet 1   imipramine  (TOFRANIL ) 50 MG tablet TAKE 1 TABLET AT BEDTIME 90 tablet 3   insulin  aspart (NOVOLOG  FLEXPEN) 100 UNIT/ML FlexPen Inject 20-25 Units into the skin 3 (three) times daily with meals.     insulin  glargine, 1 Unit Dial , (TOUJEO  SOLOSTAR) 300 UNIT/ML Solostar Pen INJECT 60 UNITS under skin  DAILY     Insulin  Pen Needle 32G X 4 MM MISC Use 4x a day 300 each 3   isosorbide  mononitrate (IMDUR ) 30 MG 24 hr tablet TAKE 1/2 TABLET BY MOUTH DAILY 45 tablet 3   Lancets (  ACCU-CHEK MULTICLIX) lancets Use as instructed three times daily dx 250.01 100 each 5   levothyroxine  (SYNTHROID ) 125 MCG tablet TAKE 1 TABLET BY MOUTH EVERY DAY BEFORE BREAKFAST 45 tablet 3   metFORMIN  (GLUCOPHAGE ) 1000 MG tablet Take 1 tablet (1,000 mg total) by mouth 2 (two) times daily. 180 tablet 3   metoprolol  tartrate (LOPRESSOR ) 50 MG tablet TAKE 1 TABLET BY MOUTH TWICE A DAY 180 tablet 3   pantoprazole  (PROTONIX ) 40 MG tablet TAKE 1 TABLET BY MOUTH EVERY DAY 90 tablet 3   PARoxetine  (PAXIL ) 20 MG tablet TAKE 1 TABLET EVERY DAY 90 tablet 3   polyethylene glycol powder (GLYCOLAX /MIRALAX ) 17 GM/SCOOP powder Take 1 Container by mouth daily as needed.     pravastatin  (PRAVACHOL ) 80 MG tablet TAKE 1 TABLET EVERY DAY (Patient not taking: Reported on 02/07/2024) 90 tablet 3   Semaglutide , 2 MG/DOSE, 8 MG/3ML  SOPN Inject 2 mg as directed once a week. (Patient taking differently: Inject 1 mg as directed once a week.) 9 mL 2   No current facility-administered medications on file prior to visit.    Labs:  Lab Results  Component Value Date   HGBA1C 8.3 (A) 02/07/2024    Notable Signs/Symptoms: Increased fatigue, frequent urination, hunger, poor sleep  Lifestyle & Dietary Hx Lives with her husband. Retired.  Estimated daily fluid intake: 40 oz Supplements:  Sleep: Poor Stress / self-care: her health Current average weekly physical activity: ADL  24-Hr Dietary Recall Eats 3 meals per day.  Eats out often but trying to make better decisions.  Estimated Energy Needs Calories: 1200 Carbohydrate: 135g Protein: 90g Fat: 33g   NUTRITION DIAGNOSIS  NB-1.1 Food and nutrition-related knowledge deficit As related to Diabetes Type 2 Uncontrolled.  As evidenced by A1C 10.1%.   NUTRITION INTERVENTION  Nutrition education (E-1) on the following topics:  Importance of timing of meals, taking proper insulin  dosing and starting to use sliding scale.  Complications related to uncontrolled blood sugars. Sliding scale schedule  Medications: Toujeo  60 units and Novolog  18 units TID  Goals: Continue to cut out sweets and processed foods Increase whole plant based foods Exercise as tolerated 30 minutes a day Keep taking insulin  as prescribed. Get A1C down to 7% Don't gain any weight over the holidays   Handouts Provided Include  and goals  Sliding scale: Novolog  20 units 90-150    151-200 +1 unit   for total 21 units     201-250 +2 units           22 units     251-300 +3  units          23 units     301-350  +4  units         24 units     351-400   + 5  units       25 units Call  Dr. Trixie if BS are > 300 mg/dl  3 times in row or 3 times in a week for medication adjustments. Drink 4 bottles per water  a day    MONITORING & EVALUATION Dietary intake, weekly physical  activity, and BS  in 3 months..  Next Steps  Patient is to work on meal planning Eating 3 meals per day.SABRASABRA

## 2024-02-20 ENCOUNTER — Ambulatory Visit

## 2024-02-27 ENCOUNTER — Ambulatory Visit: Admitting: Podiatry

## 2024-03-07 ENCOUNTER — Ambulatory Visit: Admitting: Podiatry

## 2024-03-07 ENCOUNTER — Ambulatory Visit (HOSPITAL_COMMUNITY): Admitting: Psychiatry

## 2024-03-07 DIAGNOSIS — M79675 Pain in left toe(s): Secondary | ICD-10-CM | POA: Diagnosis not present

## 2024-03-07 DIAGNOSIS — E1149 Type 2 diabetes mellitus with other diabetic neurological complication: Secondary | ICD-10-CM

## 2024-03-07 DIAGNOSIS — B351 Tinea unguium: Secondary | ICD-10-CM

## 2024-03-07 DIAGNOSIS — M79674 Pain in right toe(s): Secondary | ICD-10-CM | POA: Diagnosis not present

## 2024-03-07 DIAGNOSIS — E1140 Type 2 diabetes mellitus with diabetic neuropathy, unspecified: Secondary | ICD-10-CM | POA: Diagnosis not present

## 2024-03-08 NOTE — Progress Notes (Signed)
 Subjective:   Patient ID: Jennifer Blackburn, female   DOB: 78 y.o.   MRN: [id number]   HPI Patient presents for standard diabetic exam and cutting of her toenails which are bothersome and hard for her to do.  States she has been doing a good job A1c of 5.5 currently and no other sequela from diabetes.  Patient does not smoke likes to be active   Review of Systems  All other systems reviewed and are negative.       Objective:  Physical Exam Vitals and nursing note reviewed.  Constitutional:      Appearance: She is well-developed.  Pulmonary:     Effort: Pulmonary effort is normal.  Musculoskeletal:        General: Normal range of motion.  Skin:    General: Skin is warm.  Neurological:     Mental Status: She is alert.     Neurovascular status intact muscle strength found to be adequate range of motion adequate with the patient noted to have minimal issues as far as diabetes goes with no active neuropathy noted possible mild but nothing of the tenseness.  Also noted to have thickened incurvated nailbeds 1-5 both feet dystrophic and painful and hard for her to cut     Assessment:  Diabetes that she is doing an excellent job on now with very good control and minimal symptoms along with mycotic nail infection with discomfort bilateral     Plan:  H&P reviewed her diabetes importance of control and daily inspections debrided nailbeds 1-5 both feet no iatrogenic bleeding and encouraged her to come in if any changes or breaks in skin were to occur

## 2024-03-11 ENCOUNTER — Other Ambulatory Visit: Payer: Self-pay | Admitting: Internal Medicine

## 2024-03-11 DIAGNOSIS — C73 Malignant neoplasm of thyroid gland: Secondary | ICD-10-CM

## 2024-03-21 ENCOUNTER — Ambulatory Visit (HOSPITAL_COMMUNITY): Admitting: Psychiatry

## 2024-03-21 DIAGNOSIS — Z630 Problems in relationship with spouse or partner: Secondary | ICD-10-CM

## 2024-03-21 NOTE — Progress Notes (Signed)
 IN-PERSON  THERAPIST PROGRESS NOTE  Session Time:  Wednesday 03/21/2024 11:10 AM -  11:51 AM   Participation Level: Active  Behavioral Response: CasualAlertAnxious  Type of Therapy: Individual Therapy  Treatment Goals addressed: Verbalize an understanding and resolution of current interpersonal problems Learn and implement conflict resolution skills to resolve interpersonal problems   ProgressTowards Goals: Progressing  Interventions: Supportive/CBT  Summary: Jennifer Blackburn is a 78 y.o. female who is referred for services by PCP Dr. Rollene Pesa due to pt experiencing stress. She denies any psychiatric hospitalizations. She is a returning pt to this clinician and last was seen in 2016.  Patient is resuming services as she reports increased stress triggered by health issues, increased fatigue, and marital issues.  Patient's current symptoms include fatigue, worry, and muscle tension.  Patient last was seen about 3 months  ago.  She reports decreased stress regarding marital issues as well as her interaction with others.  Per patient's report, she has been more assertive by setting and maintaining limits with husband regarding household chores/responsibilities.  She also reports beginning to express opinions. She also reports being more assertive in the relationship with one of her friends and cites a recent example.  Patient reports feeling better since doing this and reports decreased stress.  She still expresses frustration as she still sometimes reports feeling used in some of her relationships. Suicidal/Homicidal: Nowithout intent/plan  Therapist Response: Reviewed symptoms, praised and reinforced patient efforts to use assertiveness skills, discussed effects on thoughts/mood/behavior, began to assist patient identify ways to continue to improve assertive behavior by increasing awareness to disrupt unhealthy reactions and increase healthy responses, began to discuss mindfulness, will  discuss more in session f\ Plan: Return again in 2 weeks.  Diagnosis: Stress due to marital problems  Collaboration of Care: Primary Care Provider AEB patient sees PCP Dr. Rollene Pesa  Patient/Guardian was advised Release of Information must be obtained prior to any record release in order to collaborate their care with an outside provider. Patient/Guardian was advised if they have not already done so to contact the registration department to sign all necessary forms in order for us  to release information regarding their care.   Consent: Patient/Guardian gives verbal consent for treatment and assignment of benefits for services provided during this visit. Patient/Guardian expressed understanding and agreed to proceed.   Winton FORBES Rubinstein, LCSW 03/21/2024

## 2024-03-25 ENCOUNTER — Other Ambulatory Visit: Payer: Self-pay | Admitting: Internal Medicine

## 2024-03-25 DIAGNOSIS — E1159 Type 2 diabetes mellitus with other circulatory complications: Secondary | ICD-10-CM

## 2024-03-26 ENCOUNTER — Ambulatory Visit (HOSPITAL_COMMUNITY)
Admission: RE | Admit: 2024-03-26 | Discharge: 2024-03-26 | Disposition: A | Discharge status: Home / Self Care | Source: Ambulatory Visit | Attending: Family Medicine | Admitting: Family Medicine

## 2024-03-26 DIAGNOSIS — Z1231 Encounter for screening mammogram for malignant neoplasm of breast: Secondary | ICD-10-CM | POA: Insufficient documentation

## 2024-03-27 ENCOUNTER — Other Ambulatory Visit

## 2024-03-29 LAB — TSH: TSH: 1.68 m[IU]/L (ref 0.40–4.50)

## 2024-03-29 LAB — THYROGLOBULIN LEVEL: Thyroglobulin: 0.8 ng/mL — ABNORMAL LOW

## 2024-03-29 LAB — THYROGLOBULIN ANTIBODY: Thyroglobulin Ab: <1 [IU]/mL

## 2024-04-02 ENCOUNTER — Other Ambulatory Visit: Payer: Self-pay | Admitting: Internal Medicine

## 2024-04-02 DIAGNOSIS — C73 Malignant neoplasm of thyroid gland: Secondary | ICD-10-CM

## 2024-04-02 MED ORDER — TOUJEO SOLOSTAR 300 UNIT/ML ~~LOC~~ SOPN: PEN_INJECTOR | SUBCUTANEOUS | 3 refills | Status: AC

## 2024-04-02 NOTE — Telephone Encounter (Signed)
 Pt called this morning, for something else and while on the phone with her she asked about her results. I provided her the results and she agrees to the [place] [facility] .

## 2024-04-02 NOTE — Addendum Note (Signed)
 Addended by: CLEOTILDE ROLIN RAMAN on: 04/02/2024 10:43 AM   Modules accepted: Orders

## 2024-04-04 ENCOUNTER — Ambulatory Visit (HOSPITAL_COMMUNITY): Admitting: Psychiatry

## 2024-04-04 DIAGNOSIS — F439 Reaction to severe stress, unspecified: Secondary | ICD-10-CM | POA: Diagnosis not present

## 2024-04-04 DIAGNOSIS — Z630 Problems in relationship with spouse or partner: Secondary | ICD-10-CM | POA: Diagnosis not present

## 2024-04-04 NOTE — Progress Notes (Signed)
 IN-PERSON  THERAPIST PROGRESS NOTE  Session Time:  Wednesday 04/05/2023 1:15 PM -  2:05 PM   Participation Level: Active  Behavioral Response: CasualAlertAnxious  Type of Therapy: Individual Therapy  Treatment Goals addressed: Verbalize an understanding and resolution of current interpersonal problems Learn and implement conflict resolution skills to resolve interpersonal problems   ProgressTowards Goals: Progressing  Interventions: Supportive/CBT  Summary: Jennifer Blackburn is a 79 y.o. female who is referred for services by PCP Dr. Rollene Pesa due to pt experiencing stress. She denies any psychiatric hospitalizations. She is a returning pt to this clinician and last was seen in 2016.  Patient is resuming services as she reports increased stress triggered by health issues, increased fatigue, and marital issues.  Patient's current symptoms include fatigue, worry, and muscle tension.  Patient last was seen about 2 weeks  ago.  She reports increased marital stress.  She has become more argumentative and critical.  She expresses sadness and hurt regarding his critical comment to her when they were at a social event this past weekend. She reports one of her relatives heard this.  She reports beginning to have more frequent memories of husband's past negative behavior. She reports feeling disrespected. She also expresses disappointment as she states thinking their marriage would be better at this stage in their lives. She reports a patten of trying to bury her feelings about her husband's behavior throughout their marriage.  Suicidal/Homicidal: Nowithout intent/plan  Therapist Response: Reviewed symptoms, discussed stressors, facilitated patient expressing thoughts and feelings regarding recent incident, validated feelings, assisted patient identify her pattern of managing her emotions in the past and possible effects on current functioning, discussed the role of emotions, normalized feelings  related to loss   plan: Return again in 2 weeks.  Diagnosis: Stress due to marital problems  Collaboration of Care: Primary Care Provider AEB patient sees PCP Dr. Rollene Pesa  Patient/Guardian was advised Release of Information must be obtained prior to any record release in order to collaborate their care with an outside provider. Patient/Guardian was advised if they have not already done so to contact the registration department to sign all necessary forms in order for us  to release information regarding their care.   Consent: Patient/Guardian gives verbal consent for treatment and assignment of benefits for services provided during this visit. Patient/Guardian expressed understanding and agreed to proceed.   Winton FORBES Rubinstein, LCSW 04/04/2024

## 2024-04-10 ENCOUNTER — Ambulatory Visit
Admission: RE | Admit: 2024-04-10 | Discharge: 2024-04-10 | Disposition: A | Source: Ambulatory Visit | Attending: Internal Medicine | Admitting: Internal Medicine

## 2024-04-10 DIAGNOSIS — C73 Malignant neoplasm of thyroid gland: Secondary | ICD-10-CM

## 2024-04-13 ENCOUNTER — Ambulatory Visit: Payer: Self-pay | Admitting: Internal Medicine

## 2024-04-19 ENCOUNTER — Ambulatory Visit

## 2024-05-02 ENCOUNTER — Ambulatory Visit (HOSPITAL_COMMUNITY): Admitting: Psychiatry

## 2024-05-08 ENCOUNTER — Ambulatory Visit: Admitting: Internal Medicine

## 2024-05-22 ENCOUNTER — Encounter: Admitting: Nutrition

## 2024-05-23 ENCOUNTER — Ambulatory Visit (HOSPITAL_COMMUNITY): Admitting: Psychiatry

## 2024-06-01 ENCOUNTER — Ambulatory Visit: Admitting: Family Medicine

## 2024-06-26 ENCOUNTER — Ambulatory Visit: Payer: Self-pay | Admitting: Family Medicine

## 2024-09-17 ENCOUNTER — Ambulatory Visit
